# Patient Record
Sex: Male | Born: 1940
Health system: Southern US, Community
[De-identification: ages and names within clinical notes are randomized; demographics above are authoritative.]

## PROBLEM LIST (undated history)

## (undated) DIAGNOSIS — M1990 Unspecified osteoarthritis, unspecified site: Secondary | ICD-10-CM

## (undated) DIAGNOSIS — R001 Bradycardia, unspecified: Secondary | ICD-10-CM

## (undated) DIAGNOSIS — I10 Essential (primary) hypertension: Secondary | ICD-10-CM

## (undated) DIAGNOSIS — E785 Hyperlipidemia, unspecified: Secondary | ICD-10-CM

## (undated) DIAGNOSIS — I714 Abdominal aortic aneurysm, without rupture, unspecified: Secondary | ICD-10-CM

## (undated) DIAGNOSIS — K219 Gastro-esophageal reflux disease without esophagitis: Secondary | ICD-10-CM

## (undated) DIAGNOSIS — K649 Unspecified hemorrhoids: Secondary | ICD-10-CM

## (undated) DIAGNOSIS — I499 Cardiac arrhythmia, unspecified: Secondary | ICD-10-CM

## (undated) DIAGNOSIS — Z8719 Personal history of other diseases of the digestive system: Secondary | ICD-10-CM

## (undated) DIAGNOSIS — J302 Other seasonal allergic rhinitis: Secondary | ICD-10-CM

## (undated) HISTORY — PX: HERNIA REPAIR: SHX51

## (undated) HISTORY — DX: Hyperlipidemia, unspecified: E78.5

## (undated) HISTORY — PX: TOTAL ELBOW REPLACEMENT: SUR1214

## (undated) HISTORY — PX: EYE SURGERY: SHX253

## (undated) HISTORY — PX: DIAGNOSTIC LAPAROSCOPY: SUR761

## (undated) HISTORY — PX: JOINT REPLACEMENT: SHX530

---

## 1991-11-15 HISTORY — PX: OTHER SURGICAL HISTORY: SHX169

## 1998-04-15 ENCOUNTER — Observation Stay (HOSPITAL_COMMUNITY): Admission: RE | Admit: 1998-04-15 | Discharge: 1998-04-16 | Payer: Self-pay | Admitting: General Surgery

## 2004-11-17 ENCOUNTER — Encounter: Admission: RE | Admit: 2004-11-17 | Discharge: 2004-11-17 | Payer: Self-pay | Admitting: General Surgery

## 2005-01-19 ENCOUNTER — Encounter: Admission: RE | Admit: 2005-01-19 | Discharge: 2005-01-19 | Payer: Self-pay | Admitting: Internal Medicine

## 2005-05-24 ENCOUNTER — Encounter: Admission: RE | Admit: 2005-05-24 | Discharge: 2005-05-24 | Payer: Self-pay | Admitting: Internal Medicine

## 2005-11-28 ENCOUNTER — Encounter: Admission: RE | Admit: 2005-11-28 | Discharge: 2005-11-28 | Payer: Self-pay | Admitting: Internal Medicine

## 2007-01-31 ENCOUNTER — Encounter: Admission: RE | Admit: 2007-01-31 | Discharge: 2007-01-31 | Payer: Self-pay | Admitting: Internal Medicine

## 2007-11-15 HISTORY — PX: CATARACT EXTRACTION: SUR2

## 2008-11-26 ENCOUNTER — Encounter: Admission: RE | Admit: 2008-11-26 | Discharge: 2008-11-26 | Payer: Self-pay | Admitting: Internal Medicine

## 2013-03-25 ENCOUNTER — Other Ambulatory Visit: Payer: Self-pay | Admitting: Internal Medicine

## 2013-03-25 DIAGNOSIS — R7989 Other specified abnormal findings of blood chemistry: Secondary | ICD-10-CM

## 2013-03-26 ENCOUNTER — Ambulatory Visit
Admission: RE | Admit: 2013-03-26 | Discharge: 2013-03-26 | Disposition: A | Discharge status: Home / Self Care | Payer: Medicare Other | Source: Ambulatory Visit | Attending: Internal Medicine | Admitting: Internal Medicine

## 2013-03-26 DIAGNOSIS — R7989 Other specified abnormal findings of blood chemistry: Secondary | ICD-10-CM

## 2013-03-26 DIAGNOSIS — IMO0002 Reserved for concepts with insufficient information to code with codable children: Secondary | ICD-10-CM

## 2013-04-29 ENCOUNTER — Other Ambulatory Visit: Payer: Self-pay | Admitting: Urology

## 2013-05-03 ENCOUNTER — Encounter (HOSPITAL_COMMUNITY): Payer: Self-pay | Admitting: Pharmacy Technician

## 2013-05-09 ENCOUNTER — Encounter (HOSPITAL_COMMUNITY): Payer: Self-pay

## 2013-05-09 ENCOUNTER — Encounter (HOSPITAL_COMMUNITY)
Admission: RE | Admit: 2013-05-09 | Discharge: 2013-05-09 | Disposition: A | Discharge status: Home / Self Care | Payer: Medicare Other | Source: Ambulatory Visit | Attending: Urology | Admitting: Urology

## 2013-05-09 ENCOUNTER — Ambulatory Visit (HOSPITAL_COMMUNITY)
Admission: RE | Admit: 2013-05-09 | Discharge: 2013-05-09 | Disposition: A | Discharge status: Home / Self Care | Payer: Medicare Other | Source: Ambulatory Visit | Attending: Urology | Admitting: Urology

## 2013-05-09 DIAGNOSIS — Z01818 Encounter for other preprocedural examination: Secondary | ICD-10-CM | POA: Insufficient documentation

## 2013-05-09 DIAGNOSIS — I10 Essential (primary) hypertension: Secondary | ICD-10-CM | POA: Insufficient documentation

## 2013-05-09 HISTORY — DX: Other seasonal allergic rhinitis: J30.2

## 2013-05-09 HISTORY — DX: Essential (primary) hypertension: I10

## 2013-05-09 HISTORY — DX: Unspecified hemorrhoids: K64.9

## 2013-05-09 HISTORY — DX: Cardiac arrhythmia, unspecified: I49.9

## 2013-05-09 LAB — CBC
MCV: 84.8 fL (ref 78.0–100.0)
Platelets: 161 10*3/uL (ref 150–400)
RDW: 12.8 % (ref 11.5–15.5)
WBC: 9.6 10*3/uL (ref 4.0–10.5)

## 2013-05-09 LAB — BASIC METABOLIC PANEL
Chloride: 103 mEq/L (ref 96–112)
Creatinine, Ser: 1.19 mg/dL (ref 0.50–1.35)
GFR calc Af Amer: 70 mL/min — ABNORMAL LOW (ref >90)
Potassium: 4.1 mEq/L (ref 3.5–5.1)
Sodium: 140 mEq/L (ref 135–145)

## 2013-05-09 LAB — SURGICAL PCR SCREEN
MRSA, PCR: NEGATIVE
Staphylococcus aureus: NEGATIVE

## 2013-05-09 NOTE — Progress Notes (Signed)
LOV note Dr. Nehemiah Settle 04/30/13 on chart, office note 03/22/12 Dr. Mayford Knife on chart, ECHO 03/22/12 on chart

## 2013-05-09 NOTE — Patient Instructions (Signed)
20 SHADMAN TOZZI  05/09/2013   Your procedure is scheduled on: 05/15/13  Report to [facility] at 7:15 AM.  Call this number if you have problems the morning of surgery [phone]: [phone]   Remember:   Do not eat food or drink liquids After Midnight.     Take these medicines the morning of surgery with A SIP OF WATER: amlodipine   Do not wear jewelry, make-up or nail polish.  Do not wear lotions, powders, or perfumes. You may wear deodorant.  Do not shave 48 hours prior to surgery. Men may shave face and neck.  Do not bring valuables to the hospital.  Contacts, dentures or bridgework may not be worn into surgery.  Leave suitcase in the car. After surgery it may be brought to your room.  For patients admitted to the hospital, checkout time is 11:00 AM the day of discharge.    Please read over the following fact sheets that you were given: MRSA Information, blood fact sheet  Birdie Sons, RN  pre op nurse call if needed [phone]    FAILURE TO FOLLOW THESE INSTRUCTIONS MAY RESULT IN CANCELLATION OF YOUR SURGERY   Patient Signature: ___________________________________________

## 2013-05-14 MED ORDER — DEXTROSE 5 % IV SOLN
365.0000 mg | INTRAVENOUS | Status: AC
  Administered 2013-05-15: 365 mg via INTRAVENOUS
  Filled 2013-05-14: qty 9.13

## 2013-05-14 NOTE — H&P (Signed)
History of Present Illness  James Estrada presents today for followup status post his recent urodynamics. Again the recent events over the last month or outlined in the previous notes. In summary he developed real true silent prostatism. Creatinine increased to close to 2 and the patient was noted to have a markedly distended bladder and bilateral hydroureteronephrosis. The patient was catheterized for well over 1000 cc residual urine. His hydronephrosis for the most part has resolved. Creatinine one recheck was down to 1.37 which was strongly encouraging. He has been known to have massive BPH with a prostate of over 100 g back in 2007. Sizes not been reassessed more recently. He has been on Avodart but only for the last 3-4 weeks. The patient recently underwent urodynamic testing to determine how much of this was secondary to outlet obstruction versus decompensated bladder. He recently underwent urodynamic testing to determine how much of this is secondary to obstruction versus decompensated bladder.  On urodynamic testing first sensation occurred at 350 mL. The patient did not develop a strong desire until 650 mL. There did not appear to be any definitive evidence of instability. On pressure flow studies the patient unfortunately was not able to void. Detrusor pressure however was 78 cm of water pressure. He did have what looked like a fairly well sustained voluntary detrusor contraction. His Foley catheter was reinserted. It does appear that he is a good candidate for surgical relief of his outlet obstruction. The rule issue at this point is whether he could undergo an endoscopic procedure or whether he would be better off having an open prostatectomy.   Surgical History Problems  1. History of  Inguinal Hernia Repair  Current Meds 1. Aspirin 81 MG Oral Tablet; 1 per day; Therapy: (Recorded:22Mar2012) to 2. Jalyn 0.5-0.4 MG Oral Capsule; TAKE 1 CAPSULE Daily; Therapy: 16May2014 to  (Evaluate:16May2015)   Requested for: 16May2014; Last Rx:16May2014 3. Multi-Vitamin TABS; 1 per day; Therapy: (Recorded:22Mar2012) to 4. Omega-3 CF CAPS; TAKE 1 CAPSULE DAILY; Therapy: (Recorded:22Mar2012) to  Allergies Medication  1. No Known Drug Allergies  Family History Problems  1. Maternal history of  Acute Myocardial Infarction V17.3 2. Family history of  Acute Myocardial Infarction V17.3 grandfather 3. Family history of  Prostate Cancer V57.42 uncle  Social History Problems  1. Alcohol Use 1 2. Caffeine Use 2 3. Marital History - Currently Married 4. Never A Smoker 5. Occupation: retired Denied  6. Tobacco Use  Review of Systems Genitourinary, constitutional, skin, eye, otolaryngeal, hematologic/lymphatic, cardiovascular, pulmonary, endocrine, musculoskeletal, gastrointestinal, neurological and psychiatric system(s) were reviewed and pertinent findings if present are noted.  Genitourinary: urinary frequency and nocturia, but urine stream is not weak, no incomplete emptying of bladder and no hematuria.  Gastrointestinal: heartburn.  ENT: no sore throat and no sinus problems.    Vitals Vital Signs [Data Includes: Last 1 Day]  12Jun2014 10:38AM  Blood Pressure: 150 / 83 Temperature: 98.6 F Heart Rate: 56  Well-developed well-nourished male in no acute distress Respiratory: Normal effort Cardiac: Regular rate and rhythm Abdomen: Soft nontender no palpable masses Genitourinary: Normal external genitalia with indwelling Foley catheter. Prostate 3+ without nodules. Extremities: No edema or tenderness Neurologic: Nonfocal   Results/Data  Prostate ultrasound: This was done to determine prostate size for treatment planning purposes. Prostatic urethral length was just over 7 cm. Total volume was 165 g     Procedure  Procedure: Cystoscopy   Indication: Lower Urinary Tract Symptoms.  Informed Consent: Risks, benefits, and potential adverse events were discussed and  informed consent was  obtained from the patient . Specific risks including, but not limited to bleeding, infection, pain, allergic reaction etc. were explained.  Prep: The patient was prepped with betadine.  Anesthesia:. Local anesthesia was administered intraurethrally with 2% lidocaine jelly.  Procedure Note:  Urethral meatus:. No abnormalities.  Anterior urethra: No abnormalities.  Prostatic urethra:. Estimated length was 6 cm. There was visual obstruction of the prostatic urethra. The lateral prostatic lobes were enlarged.  Bladder: Visulization was clear. Examination of the bladder demonstrated erythematous mucosa and edema. The patient tolerated the procedure well.  Complications: None. Foley re-inserted.    Assessment Assessed  1. Acute Urinary Retention 788.20 2. Benign Prostatic Hypertrophy With Urinary Obstruction 600.01  Plan  Benign Prostatic Hypertrophy With Urinary Obstruction (600.01)  1. Cysto  Done: 12Jun2014 2. Follow-up Schedule Surgery Office  Follow-up  Done: 12Jun2014  PROSTATE U/S  Status: Resulted - Requires Verification  Done: 01Jan0001 12:00AM Ordered Today; For: Benign Prostatic Hypertrophy With Urinary Obstruction (600.01); Ordered By: Barron Alvine  Due: 14Jun2014 Marked Important; Last Updated By: Dorcas Mcmurray   Discussion/Summary  Naethan clearly has a massive BPH. I am extremely encouraged that his renal function has improved dramatically with catheter drainage and I would expect to continue to improve. I was also very encouraged on urodynamic study was able to generate a well sustained detrusor contraction. This suggested to have a much better chance of having a successful outcome from surgical release of his BPH last outlet obstruction. The real issue is whether he would be better served with a simple open prostatectomy versus a endoscopic surgical procedure.   A total of 45 minutes were spent in the overall care of the patient today with 25 minutes in direct face to face  consultation.    Signatures Electronically signed by : Barron Alvine, M.D.; Apr 25 2013  1:10PM

## 2013-05-15 ENCOUNTER — Inpatient Hospital Stay (HOSPITAL_COMMUNITY): Payer: Medicare Other | Admitting: Anesthesiology

## 2013-05-15 ENCOUNTER — Encounter (HOSPITAL_COMMUNITY): Payer: Self-pay | Admitting: Anesthesiology

## 2013-05-15 ENCOUNTER — Encounter (HOSPITAL_COMMUNITY)
Admission: RE | Disposition: A | Discharge status: Home / Self Care | Payer: Self-pay | Source: Ambulatory Visit | Attending: Urology

## 2013-05-15 ENCOUNTER — Inpatient Hospital Stay (HOSPITAL_COMMUNITY)
Admission: RE | Admit: 2013-05-15 | Discharge: 2013-05-18 | DRG: 707 | Disposition: A | Discharge status: Home / Self Care | Payer: Medicare Other | Source: Ambulatory Visit | Attending: Urology | Admitting: Urology

## 2013-05-15 ENCOUNTER — Encounter (HOSPITAL_COMMUNITY): Payer: Self-pay | Admitting: *Deleted

## 2013-05-15 DIAGNOSIS — N320 Bladder-neck obstruction: Secondary | ICD-10-CM | POA: Diagnosis present

## 2013-05-15 DIAGNOSIS — R339 Retention of urine, unspecified: Secondary | ICD-10-CM | POA: Diagnosis present

## 2013-05-15 DIAGNOSIS — N3289 Other specified disorders of bladder: Secondary | ICD-10-CM | POA: Diagnosis present

## 2013-05-15 DIAGNOSIS — N1330 Unspecified hydronephrosis: Secondary | ICD-10-CM | POA: Diagnosis present

## 2013-05-15 DIAGNOSIS — Z8042 Family history of malignant neoplasm of prostate: Secondary | ICD-10-CM

## 2013-05-15 DIAGNOSIS — N138 Other obstructive and reflux uropathy: Principal | ICD-10-CM | POA: Diagnosis present

## 2013-05-15 DIAGNOSIS — N289 Disorder of kidney and ureter, unspecified: Secondary | ICD-10-CM | POA: Diagnosis present

## 2013-05-15 DIAGNOSIS — N182 Chronic kidney disease, stage 2 (mild): Secondary | ICD-10-CM | POA: Diagnosis present

## 2013-05-15 DIAGNOSIS — N401 Enlarged prostate with lower urinary tract symptoms: Secondary | ICD-10-CM

## 2013-05-15 HISTORY — PX: PROSTATECTOMY: SHX69

## 2013-05-15 LAB — TYPE AND SCREEN
ABO/RH(D): A POS
Antibody Screen: NEGATIVE

## 2013-05-15 SURGERY — PROSTATECTOMY, RETROPUBIC
Anesthesia: General | Wound class: Clean Contaminated

## 2013-05-15 MED ORDER — DEXTROSE 5 % IV SOLN
1.0000 g | INTRAVENOUS | Status: DC
  Administered 2013-05-15 – 2013-05-17 (×3): 1 g via INTRAVENOUS
  Filled 2013-05-15 (×4): qty 10

## 2013-05-15 MED ORDER — LACTATED RINGERS IV SOLN: INTRAVENOUS | Status: DC

## 2013-05-15 MED ORDER — OXYCODONE-ACETAMINOPHEN 5-325 MG PO TABS
1.0000 | ORAL_TABLET | ORAL | Status: DC | PRN
  Administered 2013-05-15 (×2): 2 via ORAL
  Administered 2013-05-16 – 2013-05-17 (×6): 1 via ORAL
  Filled 2013-05-15: qty 1
  Filled 2013-05-15: qty 2
  Filled 2013-05-15: qty 1
  Filled 2013-05-15: qty 2
  Filled 2013-05-15 (×3): qty 1
  Filled 2013-05-15: qty 2

## 2013-05-15 MED ORDER — DOCUSATE SODIUM 100 MG PO CAPS
100.0000 mg | ORAL_CAPSULE | Freq: Two times a day (BID) | ORAL | Status: DC
  Administered 2013-05-15 – 2013-05-18 (×7): 100 mg via ORAL
  Filled 2013-05-15 (×8): qty 1

## 2013-05-15 MED ORDER — PROMETHAZINE HCL 25 MG/ML IJ SOLN: 6.2500 mg | INTRAMUSCULAR | Status: DC | PRN

## 2013-05-15 MED ORDER — CEFAZOLIN SODIUM-DEXTROSE 2-3 GM-% IV SOLR
2.0000 g | INTRAVENOUS | Status: AC
  Administered 2013-05-15: 2 g via INTRAVENOUS

## 2013-05-15 MED ORDER — MORPHINE SULFATE 2 MG/ML IJ SOLN: 2.0000 mg | INTRAMUSCULAR | Status: DC | PRN

## 2013-05-15 MED ORDER — TAMSULOSIN HCL 0.4 MG PO CAPS
0.4000 mg | ORAL_CAPSULE | Freq: Every day | ORAL | Status: DC
  Administered 2013-05-15 – 2013-05-16 (×2): 0.4 mg via ORAL
  Filled 2013-05-15 (×3): qty 1

## 2013-05-15 MED ORDER — GLYCOPYRROLATE 0.2 MG/ML IJ SOLN
INTRAMUSCULAR | Status: DC | PRN
  Administered 2013-05-15: .6 mg via INTRAVENOUS

## 2013-05-15 MED ORDER — SODIUM CHLORIDE 0.9 % IR SOLN
Status: DC | PRN
  Administered 2013-05-15: 3000 mL via INTRAVESICAL
  Administered 2013-05-15: 3000 mL

## 2013-05-15 MED ORDER — DUTASTERIDE 0.5 MG PO CAPS
0.5000 mg | ORAL_CAPSULE | Freq: Every day | ORAL | Status: DC
  Administered 2013-05-15 – 2013-05-16 (×2): 0.5 mg via ORAL
  Filled 2013-05-15 (×3): qty 1

## 2013-05-15 MED ORDER — HYDROMORPHONE HCL PF 1 MG/ML IJ SOLN
0.2500 mg | INTRAMUSCULAR | Status: DC | PRN
  Administered 2013-05-15 (×4): 0.5 mg via INTRAVENOUS

## 2013-05-15 MED ORDER — MIDAZOLAM HCL 5 MG/5ML IJ SOLN
INTRAMUSCULAR | Status: DC | PRN
  Administered 2013-05-15: 2 mg via INTRAVENOUS

## 2013-05-15 MED ORDER — FENTANYL CITRATE 0.05 MG/ML IJ SOLN
INTRAMUSCULAR | Status: DC | PRN
  Administered 2013-05-15 (×4): 50 ug via INTRAVENOUS

## 2013-05-15 MED ORDER — CIPROFLOXACIN HCL 250 MG PO TABS: 250.0000 mg | ORAL_TABLET | Freq: Two times a day (BID) | ORAL | Status: DC

## 2013-05-15 MED ORDER — AMLODIPINE BESYLATE 2.5 MG PO TABS
2.5000 mg | ORAL_TABLET | Freq: Every morning | ORAL | Status: DC
  Administered 2013-05-16 – 2013-05-17 (×2): 2.5 mg via ORAL
  Filled 2013-05-15 (×3): qty 1

## 2013-05-15 MED ORDER — 0.9 % SODIUM CHLORIDE (POUR BTL) OPTIME
TOPICAL | Status: DC | PRN
  Administered 2013-05-15: 2000 mL

## 2013-05-15 MED ORDER — LACTATED RINGERS IV SOLN
INTRAVENOUS | Status: DC
  Administered 2013-05-15: 1000 mL via INTRAVENOUS
  Administered 2013-05-15: 12:00:00 via INTRAVENOUS

## 2013-05-15 MED ORDER — PROPOFOL 10 MG/ML IV BOLUS
INTRAVENOUS | Status: DC | PRN
  Administered 2013-05-15: 180 mg via INTRAVENOUS

## 2013-05-15 MED ORDER — HYDROCODONE-ACETAMINOPHEN 5-325 MG PO TABS: 1.0000 | ORAL_TABLET | Freq: Four times a day (QID) | ORAL | Status: DC | PRN

## 2013-05-15 MED ORDER — DUTASTERIDE-TAMSULOSIN HCL 0.5-0.4 MG PO CAPS: 1.0000 | ORAL_CAPSULE | Freq: Every day | ORAL | Status: DC

## 2013-05-15 MED ORDER — ROCURONIUM BROMIDE 100 MG/10ML IV SOLN
INTRAVENOUS | Status: DC | PRN
  Administered 2013-05-15 (×2): 5 mg via INTRAVENOUS
  Administered 2013-05-15: 50 mg via INTRAVENOUS

## 2013-05-15 MED ORDER — BUPIVACAINE HCL (PF) 0.25 % IJ SOLN
INTRAMUSCULAR | Status: DC | PRN
  Administered 2013-05-15: 20 mL

## 2013-05-15 MED ORDER — ONDANSETRON HCL 4 MG/2ML IJ SOLN: 4.0000 mg | INTRAMUSCULAR | Status: DC | PRN

## 2013-05-15 MED ORDER — KCL IN DEXTROSE-NACL 20-5-0.45 MEQ/L-%-% IV SOLN
INTRAVENOUS | Status: DC
  Administered 2013-05-15 – 2013-05-16 (×2): via INTRAVENOUS
  Filled 2013-05-15 (×8): qty 1000

## 2013-05-15 MED ORDER — NEOSTIGMINE METHYLSULFATE 1 MG/ML IJ SOLN
INTRAMUSCULAR | Status: DC | PRN
  Administered 2013-05-15: 4 mg via INTRAVENOUS

## 2013-05-15 MED ORDER — ONDANSETRON HCL 4 MG/2ML IJ SOLN
INTRAMUSCULAR | Status: DC | PRN
  Administered 2013-05-15: 4 mg via INTRAVENOUS

## 2013-05-15 MED ORDER — LIDOCAINE HCL (CARDIAC) 20 MG/ML IV SOLN
INTRAVENOUS | Status: DC | PRN
  Administered 2013-05-15: 80 mg via INTRAVENOUS

## 2013-05-15 MED ORDER — OXYBUTYNIN CHLORIDE 5 MG PO TABS
5.0000 mg | ORAL_TABLET | Freq: Three times a day (TID) | ORAL | Status: DC | PRN
  Filled 2013-05-15: qty 1

## 2013-05-15 MED ORDER — EPHEDRINE SULFATE 50 MG/ML IJ SOLN
INTRAMUSCULAR | Status: DC | PRN
  Administered 2013-05-15: 5 mg via INTRAVENOUS

## 2013-05-15 MED ORDER — SODIUM CHLORIDE 0.9 % IR SOLN
3000.0000 mL | Status: DC
  Administered 2013-05-15 (×2): 3000 mL

## 2013-05-15 SURGICAL SUPPLY — 54 items
BAG URINE DRAINAGE (UROLOGICAL SUPPLIES) ×2 IMPLANT
BLADE EXTENDED COATED 6.5IN (ELECTRODE) ×2 IMPLANT
BLADE HEX COATED 2.75 (ELECTRODE) ×2 IMPLANT
BLADE SURG 15 STRL LF DISP TIS (BLADE) ×1 IMPLANT
BLADE SURG 15 STRL SS (BLADE) ×1
CATH FOLEY 2WAY SLVR  5CC 22FR (CATHETERS)
CATH FOLEY 2WAY SLVR 30CC 22FR (CATHETERS) ×2 IMPLANT
CATH FOLEY 2WAY SLVR 5CC 22FR (CATHETERS) IMPLANT
CATH FOLEY 3WAY 30CC 24FR (CATHETERS) ×1
CATH URTH STD 24FR FL 3W 2 (CATHETERS) ×1 IMPLANT
CLIP LIGATING HEM O LOK PURPLE (MISCELLANEOUS) ×2 IMPLANT
CLIP LIGATING HEMO O LOK GREEN (MISCELLANEOUS) ×2 IMPLANT
CLOTH BEACON ORANGE TIMEOUT ST (SAFETY) ×2 IMPLANT
COVER SURGICAL LIGHT HANDLE (MISCELLANEOUS) ×2 IMPLANT
DISSECTOR ROUND CHERRY 3/8 STR (MISCELLANEOUS) ×2 IMPLANT
DRAIN CHANNEL 10F 3/8 F FF (DRAIN) ×2 IMPLANT
DRAPE LAPAROTOMY T 102X78X121 (DRAPES) ×2 IMPLANT
DRAPE TABLE BACK 44X90 PK DISP (DRAPES) ×2 IMPLANT
DRAPE WARM FLUID 44X44 (DRAPE) ×2 IMPLANT
DRSG PAD ABDOMINAL 8X10 ST (GAUZE/BANDAGES/DRESSINGS) ×2 IMPLANT
ELECT REM PT RETURN 9FT ADLT (ELECTROSURGICAL) ×2
ELECTRODE REM PT RTRN 9FT ADLT (ELECTROSURGICAL) ×1 IMPLANT
EVACUATOR SILICONE 100CC (DRAIN) ×2 IMPLANT
GAUZE SPONGE 4X4 16PLY XRAY LF (GAUZE/BANDAGES/DRESSINGS) ×2 IMPLANT
GLOVE BIOGEL M STRL SZ7.5 (GLOVE) ×2 IMPLANT
GOWN PREVENTION PLUS XLARGE (GOWN DISPOSABLE) ×4 IMPLANT
GOWN STRL REIN XL XLG (GOWN DISPOSABLE) ×2 IMPLANT
HOLDER FOLEY CATH W/STRAP (MISCELLANEOUS) ×2 IMPLANT
KIT BASIN OR (CUSTOM PROCEDURE TRAY) ×2 IMPLANT
LUBRICANT JELLY ST 5GR 8946 (MISCELLANEOUS) IMPLANT
NS IRRIG 1000ML POUR BTL (IV SOLUTION) IMPLANT
PACK GENERAL/GYN (CUSTOM PROCEDURE TRAY) ×2 IMPLANT
PLUG CATH AND CAP STER (CATHETERS) ×2 IMPLANT
SET IRRIG Y TYPE TUR BLADDER L (SET/KITS/TRAYS/PACK) ×2 IMPLANT
SPONGE GAUZE 4X4 12PLY (GAUZE/BANDAGES/DRESSINGS) IMPLANT
SPONGE LAP 18X18 X RAY DECT (DISPOSABLE) ×4 IMPLANT
SPONGE LAP 4X18 X RAY DECT (DISPOSABLE) IMPLANT
STAPLER VISISTAT 35W (STAPLE) ×2 IMPLANT
SUT ETHILON 3 0 PS 1 (SUTURE) IMPLANT
SUT PDS AB 1 CTX 36 (SUTURE) ×2 IMPLANT
SUT SILK 0 (SUTURE)
SUT SILK 0 30XBRD TIE 6 (SUTURE) IMPLANT
SUT VIC AB 0 CTX 27 (SUTURE) IMPLANT
SUT VIC AB 2-0 UR6 27 (SUTURE) ×4 IMPLANT
SUT VIC AB 4-0 RB1 27 (SUTURE)
SUT VIC AB 4-0 RB1 27XBRD (SUTURE) IMPLANT
SUT VICRYL 0 TIES 12 18 (SUTURE) ×2 IMPLANT
SUT VICRYL 0 UR6 27IN ABS (SUTURE) ×12 IMPLANT
SYR 30ML LL (SYRINGE) ×2 IMPLANT
SYRINGE IRR TOOMEY STRL 70CC (SYRINGE) ×2 IMPLANT
TAPE CLOTH SURG 4X10 WHT LF (GAUZE/BANDAGES/DRESSINGS) IMPLANT
TAPE UMBILICAL COTTON 1/8X30 (MISCELLANEOUS) IMPLANT
TOWEL OR 17X26 10 PK STRL BLUE (TOWEL DISPOSABLE) ×2 IMPLANT
WATER STERILE IRR 1500ML POUR (IV SOLUTION) IMPLANT

## 2013-05-15 NOTE — Interval H&P Note (Signed)
History and Physical Interval Note:  05/15/2013 9:17 AM  James Estrada  has presented today for surgery, with the diagnosis of BENIGN PROSTATIC HYPERTROPHY, URINARY RETENTION  The various methods of treatment have been discussed with the patient and family. After consideration of risks, benefits and other options for treatment, the patient has consented to  Procedure(s): PROSTATECTOMY RETROPUBIC; SIMPLE OPEN PROSTATECTOMY (N/A) as a surgical intervention .  The patient's history has been reviewed, patient examined, no change in status, stable for surgery.  I have reviewed the patient's chart and labs.  Questions were answered to the patient's satisfaction.     Heike Pounds S

## 2013-05-15 NOTE — Anesthesia Preprocedure Evaluation (Addendum)
Anesthesia Evaluation  Patient identified by MRN, date of birth, ID band Patient awake    Reviewed: Allergy & Precautions, H&P , NPO status , Patient's Chart, lab work & pertinent test results  History of Anesthesia Complications Negative for: history of anesthetic complications  Airway Mallampati: I TM Distance: >3 FB Neck ROM: Full    Dental  (+) Teeth Intact and Caps   Pulmonary neg pulmonary ROS,          Cardiovascular hypertension, Pt. on medications + dysrhythmias Rhythm:Regular Rate:Normal     Neuro/Psych negative neurological ROS  negative psych ROS   GI/Hepatic negative GI ROS, Neg liver ROS,   Endo/Other  negative endocrine ROSPrior resection of pheochromocytoma  Renal/GU      Musculoskeletal   Abdominal Normal abdominal exam  (+)   Peds  Hematology negative hematology ROS (+)   Anesthesia Other Findings   Reproductive/Obstetrics                      Anesthesia Physical Anesthesia Plan  ASA: II  Anesthesia Plan: General   Post-op Pain Management:    Induction:   Airway Management Planned: Oral ETT  Additional Equipment:   Intra-op Plan:   Post-operative Plan: Extubation in OR  Informed Consent: I have reviewed the patients History and Physical, chart, labs and discussed the procedure including the risks, benefits and alternatives for the proposed anesthesia with the patient or authorized representative who has indicated his/her understanding and acceptance.   Dental advisory given  Plan Discussed with: CRNA  Anesthesia Plan Comments:         Anesthesia Quick Evaluation

## 2013-05-15 NOTE — Preoperative (Signed)
Beta Blockers   Reason not to administer Beta Blockers:Not Applicable 

## 2013-05-15 NOTE — Anesthesia Postprocedure Evaluation (Signed)
Anesthesia Post Note  Patient: James Estrada  Procedure(s) Performed: Procedure(s) (LRB): PROSTATECTOMY RETROPUBIC; SIMPLE OPEN PROSTATECTOMY (N/A)  Anesthesia type: General  Patient location: PACU  Post pain: Pain level controlled  Post assessment: Post-op Vital signs reviewed  Last Vitals:  Filed Vitals:   05/15/13 1400  BP: 142/80  Pulse: 49  Temp: 36.7 C  Resp: 13    Post vital signs: Reviewed  Level of consciousness: sedated  Complications: No apparent anesthesia complications

## 2013-05-15 NOTE — Care Management Note (Signed)
    Page 1 of 1   05/15/2013     2:41:53 PM   CARE MANAGEMENT NOTE 05/15/2013  Patient:  James Estrada, James Estrada   Account Number:  [id number]  Date Initiated:  05/15/2013  Documentation initiated by:  Lanier Clam  Subjective/Objective Assessment:   ADMITTED W/BPH,URINARY RETENTION.     Action/Plan:   FROM HOME.   Anticipated DC Date:  05/16/2013   Anticipated DC Plan:  HOME/SELF CARE      DC Planning Services  CM consult      Choice offered to / List presented to:             Status of service:  In process, will continue to follow Medicare Important Message given?   (If response is "NO", the following Medicare IM given date fields will be blank) Date Medicare IM given:   Date Additional Medicare IM given:    Discharge Disposition:    Per UR Regulation:  Reviewed for med. necessity/level of care/duration of stay  If discussed at Long Length of Stay Meetings, dates discussed:    Comments:  05/15/13 Tyde Lamison RN,BSN NCM [phone] S/P OPEN PROSTATECTOMY.

## 2013-05-15 NOTE — Transfer of Care (Signed)
Immediate Anesthesia Transfer of Care Note  Patient: James Estrada  Procedure(s) Performed: Procedure(s): PROSTATECTOMY RETROPUBIC; SIMPLE OPEN PROSTATECTOMY (N/A)  Patient Location: PACU  Anesthesia Type:General  Level of Consciousness: awake, alert , oriented and patient cooperative  Airway & Oxygen Therapy: Patient Spontanous Breathing and Patient connected to face mask oxygen  Post-op Assessment: Report given to PACU RN, Post -op Vital signs reviewed and stable and Patient moving all extremities  Post vital signs: Reviewed and stable  Complications: No apparent anesthesia complications

## 2013-05-15 NOTE — Progress Notes (Signed)
James Estrada is doing well on postoperative night 1. His pain is well-controlled at this time. Urine is light pink in color. I would plan on continued continuous bladder irrigation through the night with possible discontinuation in the morning. Hopefully can start a general diet in the morning. I would anticipate probable discharge Friday or Saturday morning. We did discuss some postoperative issues do's and don'ts and general instructions.

## 2013-05-15 NOTE — Op Note (Signed)
Preoperative diagnosis: Massive BPH with urinary retention Postoperative diagnosis: Same  Procedure: Retropubic open simple prostatectomy   Surgeon: Valetta Fuller M.D. assisted by Dr. Su Grand M.D. Anesthesia: Gen.  Indications: 72 year old male with long-standing BPH and bladder neck obstruction. Recently noted to have some renal insufficiency. He was found to have a markedly elevated postvoid residual with bilateral hydronephrosis. Post would residual was over 1000 cc. With catheter drainage his hydronephrosis resolved and renal function improved significantly. Cystoscopically the patient had massive trilobar BPH with visual obstruction. Ultrasound revealed a prostate of approximately 165 g. Given his massive BPH we felt that the most definitive treatment with the best chance of long-term success would be simple open prostatectomy. That procedure was discussed with him in detail. Risks advantages discussed with full informed consent obtained. The patient does have a chronic indwelling Foley catheter. He is currently being covered with parental broad-spectrum antibiotics and has had placement of PAS compression boots.     Technique and findings: Patient was brought the operating room where he had successful induction of general anesthesia. He was placed in supine position and prepped and draped in usual manner. Appropriate surgical timeout was performed. A standard lower midline incision was performed in the retropubic space was entered. Massively enlarged prostate was appreciated. Stay sutures were placed both in the distal and proximal aspect of the prostate capsule and also to laterally. A capsulotomy was performed. The BPH tissue was then removed with blunt dissected technique. The capsule remained intact. Vicryl suture was utilized for some  Prostatic fossa bleeding.a 24 [place] three-way Foley catheter was then placed through the prostatic fossa and into the bladder neck. The prostate capsule was  closed with running 2-0 Vicryl suture. The pelvic area was copiously irrigated. The bladder was also copiously irrigated and urine was found to be light to medium pink. It retropubic drain was placed. The midline incision was closed with a running PDS suture and then surgical clips. Blood loss was approximately 900 cc. The patient no obvious complications or problems and was brought to recovery room in stable condition.

## 2013-05-16 ENCOUNTER — Encounter (HOSPITAL_COMMUNITY): Payer: Self-pay | Admitting: Urology

## 2013-05-16 LAB — CBC
HCT: 35.6 % — ABNORMAL LOW (ref 39.0–52.0)
Hemoglobin: 11.9 g/dL — ABNORMAL LOW (ref 13.0–17.0)
MCH: 28.5 pg (ref 26.0–34.0)
MCHC: 33.4 g/dL (ref 30.0–36.0)
MCV: 85.2 fL (ref 78.0–100.0)
Platelets: 109 10*3/uL — ABNORMAL LOW (ref 150–400)
RBC: 4.18 MIL/uL — ABNORMAL LOW (ref 4.22–5.81)
RDW: 13.1 % (ref 11.5–15.5)
WBC: 12.4 10*3/uL — ABNORMAL HIGH (ref 4.0–10.5)

## 2013-05-16 LAB — BASIC METABOLIC PANEL
Calcium: 8.9 mg/dL (ref 8.4–10.5)
GFR calc Af Amer: 81 mL/min — ABNORMAL LOW (ref >90)
GFR calc non Af Amer: 70 mL/min — ABNORMAL LOW (ref >90)
Glucose, Bld: 115 mg/dL — ABNORMAL HIGH (ref 70–99)
Potassium: 4 mEq/L (ref 3.5–5.1)
Sodium: 138 mEq/L (ref 135–145)

## 2013-05-16 LAB — ABO/RH: ABO/RH(D): A POS

## 2013-05-16 MED ORDER — MENTHOL 3 MG MT LOZG
1.0000 | LOZENGE | OROMUCOSAL | Status: DC | PRN
  Administered 2013-05-16: 3 mg via ORAL
  Filled 2013-05-16: qty 9

## 2013-05-16 NOTE — Progress Notes (Signed)
1 Day Post-Op Subjective: Patient reports Incisional pain.  Tolerates diet well. C/o sore throat.  No BM yet.  Objective: Vital signs in last 24 hours: Temp:  [97.6 F (36.4 C)-98.5 F (36.9 C)] 97.6 F (36.4 C) (07/03 0456) Pulse Rate:  [44-64] 64 (07/03 0456) Resp:  [10-14] 13 (07/03 0456) BP: (114-149)/(68-91) 117/85 mmHg (07/03 0456) SpO2:  [97 %-100 %] 97 % (07/03 0456) Weight:  [78.926 kg (174 lb)] 78.926 kg (174 lb) (07/02 1700)  Intake/Output from previous day: 07/02 0701 - 07/03 0700 In: 15901.7 [P.O.:720; I.V.:4031.7; IV Piggyback:50] Out: 40981 [XBJYN:82956; Drains:235; Blood:925] Intake/Output this shift: Total I/O In: -  Out: 1925 [Urine:1925]  Physical Exam:  Alert and oriented.  In good spirits. Abdomen: Soft, non distended.   Blake drain:  235 cc since yesterday. Foley draining well.  CBI running.  Urine clear. Scrotum is normal.  No swelling.  Lab Results:  Recent Labs  05/15/13 1324 05/16/13 0514  HGB 12.3* 11.9*  HCT 36.6* 35.6*   BMET  Recent Labs  05/16/13 0514  NA 138  K 4.0  CL 102  CO2 32  GLUCOSE 115*  BUN 10  CREATININE 1.06  CALCIUM 8.9   No results found for this basename: LABPT, INR,  in the last 72 hours No results found for this basename: LABURIN,  in the last 72 hours Results for orders placed during the hospital encounter of 05/09/13  SURGICAL PCR SCREEN     Status: None   Collection Time    05/09/13  2:27 PM      Result Value Range Status   MRSA, PCR NEGATIVE  NEGATIVE Final   Staphylococcus aureus NEGATIVE  NEGATIVE Final   Comment:            The Xpert SA Assay (FDA     approved for NASAL specimens     in patients over 2 years of age),     is one component of     a comprehensive surveillance     program.  Test performance has     been validated by [employer] for patients greater     than or equal to 72 year old.     It is not intended     to diagnose infection nor to     guide or monitor treatment.     Studies/Results: No results found.  Assessment/Plan:  S/P simple retropubic prostatectomy.  Discontinue CBI.  D/C Flomax and Avodart.  Advance diet as tolerated.    LOS: 1 day   Dhanya Bogle-HENRY 05/16/2013, 12:41 PM

## 2013-05-16 NOTE — Clinical Documentation Improvement (Signed)
THIS DOCUMENT IS NOT A PERMANENT PART OF THE MEDICAL RECORD  Please update your documentation with the medical record to reflect your response to this query. If you need help knowing how to do this please call [phone].  05/16/13   Dear Dr. Isabel Caprice, D:/Associates,  In a better effort to capture your patient's severity of illness, reflect appropriate length of stay and utilization of resources, a review of the patient medical record has revealed the following indicators.    Based on your clinical judgment, please clarify and document in a progress note and/or discharge summary the clinical condition associated with the following supporting information:  In responding to this query please exercise your independent judgment.  The fact that a query is asked, does not imply that any particular answer is desired or expected.  Pt with renal insufficiency  Clarification Needed   Please clarify if renal insufficiency can be further specified as one of the diagnoses listed below and document in pn or d/c summary.  Possible Clinical Conditions?   _______CKD Stage I -  GFR > OR = 90 _______CKD Stage II - GFR 60-80 _______CKD Stage III - GFR 30-59 _______CKD Stage IV - GFR 15-29 _______CKD Stage V - GFR < 15 _______ESRD (End Stage Renal Disease) _______Other condition_____________ _______Cannot Clinically determine   Supporting Information:  Risk Factors:  BPH Urinary retention Renal insufficiency Hydronephrosis   Signs & Symptoms:   Diagnostics:  Component     Latest Ref Rng 05/16/2013          GFR calc non Af Amer     >90 mL/min 70 (L)  GFR calc Af Amer     >90 mL/min 81 (L)    Treatment Monitoring  You may use possible, probable, or suspect with inpatient documentation. possible, probable, suspected diagnoses MUST be documented at the time of discharge  Reviewed: additional documentation in the medical record ljh   Thank You,  Enis Slipper RN, BSN, MSN/Inf,  CCDS Clinical Documentation Specialist [facility] Dept Pager: [phone] / E-mail: [email or web address]   [phone] Health Information Management [place]

## 2013-05-17 NOTE — Progress Notes (Signed)
2 Days Post-Op  Subjective:  1 - Benign Prostatic Hyperplasia - s/p open simple prostatectomy 05/15/13 for refractory BPH. Path benign / inflammation. Off irrigation 7/3 and JP out 7/4 (today).  Today James Estrada is w/o significant complaints. He is tolerating regular diet and ambulatory. His path came back benign and I gave him the good news.  Objective: Vital signs in last 24 hours: Temp:  [98.6 F (37 C)-99.7 F (37.6 C)] 98.6 F (37 C) (07/04 0600) Pulse Rate:  [67-71] 67 (07/04 0600) Resp:  [18] 18 (07/04 0600) BP: (115-128)/(55-88) 128/88 mmHg (07/04 0600) SpO2:  [97 %-99 %] 99 % (07/04 0600) Last BM Date: 05/14/13  Intake/Output from previous day: 07/03 0701 - 07/04 0700 In: 3450 [P.O.:700; I.V.:600; IV Piggyback:50] Out: 7290 [Urine:7250; Drains:40] Intake/Output this shift:    General appearance: alert, cooperative and appears stated age Head: Normocephalic, without obvious abnormality, atraumatic Eyes: conjunctivae/corneas clear. PERRL, EOM's intact. Fundi benign. Ears: normal TM's and external ear canals both ears Nose: Nares normal. Septum midline. Mucosa normal. No drainage or sinus tenderness. Throat: lips, mucosa, and tongue normal; teeth and gums normal Neck: no adenopathy, no carotid bruit, no JVD, supple, symmetrical, trachea midline and thyroid not enlarged, symmetric, no tenderness/mass/nodules Back: symmetric, no curvature. ROM normal. No CVA tenderness. Resp: clear to auscultation bilaterally Chest wall: no tenderness Cardio: regular rate and rhythm, S1, S2 normal, no murmur, click, rub or gallop GI: soft, non-tender; bowel sounds normal; no masses,  no organomegaly Male genitalia: normal, Foley c/d/i with light pink urine (no clots) off irrigation. Extremities: extremities normal, atraumatic, no cyanosis or edema Pulses: 2+ and symmetric Skin: Skin color, texture, turgor normal. No rashes or lesions Lymph nodes: Cervical, supraclavicular, and axillary nodes  normal. Neurologic: Grossly normal Incision/Wound: Lower midline incision c/d/i with staples. LLQ JP with minimal serous output, removed and dry dressing applied.  Lab Results:   Recent Labs  05/15/13 1324 05/16/13 0514  WBC  --  12.4*  HGB 12.3* 11.9*  HCT 36.6* 35.6*  PLT  --  109*   BMET  Recent Labs  05/16/13 0514  NA 138  K 4.0  CL 102  CO2 32  GLUCOSE 115*  BUN 10  CREATININE 1.06  CALCIUM 8.9   PT/INR No results found for this basename: LABPROT, INR,  in the last 72 hours ABG No results found for this basename: PHART, PCO2, PO2, HCO3,  in the last 72 hours  Studies/Results: No results found.  Anti-infectives: Anti-infectives   Start     Dose/Rate Route Frequency Ordered Stop   05/15/13 1600  cefTRIAXone (ROCEPHIN) 1 g in dextrose 5 % 50 mL IVPB     1 g 100 mL/hr over 30 Minutes Intravenous Every 24 hours 05/15/13 1437     05/15/13 0743  ceFAZolin (ANCEF) IVPB 2 g/50 mL premix     2 g 100 mL/hr over 30 Minutes Intravenous 30 min pre-op 05/15/13 0743 05/15/13 1112   05/15/13 0600  gentamicin (GARAMYCIN) 365 mg in dextrose 5 % 100 mL IVPB     365 mg 109.1 mL/hr over 60 Minutes Intravenous 30 min pre-op 05/14/13 1351 05/15/13 1119   05/15/13 0000  ciprofloxacin (CIPRO) 250 MG tablet     250 mg Oral 2 times daily 05/15/13 1009        Assessment/Plan:  1 - Benign Prostatic Hyperplasia - Making excellent progress POD2. Encouraged continued ambulation. Discussed goals for DC and will plan for DC tomorrow AM as long as progressing. JP out  todayBerneice Estrada, James Estrada 05/17/2013

## 2013-05-18 MED ORDER — OXYCODONE-ACETAMINOPHEN 5-325 MG PO TABS: 1.0000 | ORAL_TABLET | ORAL | Status: DC | PRN

## 2013-05-18 MED ORDER — SENNA-DOCUSATE SODIUM 8.6-50 MG PO TABS: 1.0000 | ORAL_TABLET | Freq: Two times a day (BID) | ORAL | Status: DC

## 2013-05-18 MED ORDER — CIPROFLOXACIN HCL 250 MG PO TABS: 250.0000 mg | ORAL_TABLET | Freq: Two times a day (BID) | ORAL | Status: DC

## 2013-05-18 NOTE — Discharge Summary (Addendum)
Physician Discharge Summary  Patient ID: James Estrada MRN: [id number] DOB/AGE: February 20, 1941 72 y.o.  Admit date: 05/15/2013 Discharge date: 05/18/2013  Admission Diagnoses: Prostatic Hyperplasia with Refractory urinary symptoms  Discharge Diagnoses: Prostatic Hyperplasia with Refractory urinary symptoms Active Problems:   BPH (benign prostatic hypertrophy) with urinary obstruction   Discharged Condition: good  Hospital Course:   1 - Benign Prostatic Hyperplasia - s/p open simple prostatectomy 05/15/13 for refractory BPH. Path benign / inflammation. Off irrigation 7/3 and JP out 7/4. By 7/5, the day of discharge, pt is ambulatory, tolerating regular diet, pain controlled with PO meds and felt to be adequate for discharge.   Consults: None  Significant Diagnostic Studies: labs: prostate pathology - benign  Treatments: surgery: open simple prostatectomy 05/15/13 for refractory BPH  Discharge Exam: Blood pressure 128/74, pulse 69, temperature 98.2 F (36.8 C), temperature source Oral, resp. rate 16, height 6' (1.829 m), weight 78.926 kg (174 lb), SpO2 97.00%. General appearance: alert, cooperative and appears stated age Head: Normocephalic, without obvious abnormality, atraumatic Eyes: conjunctivae/corneas clear. PERRL, EOM's intact. Fundi benign. Ears: normal TM's and external ear canals both ears Nose: Nares normal. Septum midline. Mucosa normal. No drainage or sinus tenderness. Throat: lips, mucosa, and tongue normal; teeth and gums normal Neck: no adenopathy, no carotid bruit, no JVD, supple, symmetrical, trachea midline and thyroid not enlarged, symmetric, no tenderness/mass/nodules Back: symmetric, no curvature. ROM normal. No CVA tenderness. Resp: clear to auscultation bilaterally Chest wall: no tenderness Cardio: regular rate and rhythm, S1, S2 normal, no murmur, click, rub or gallop GI: soft, non-tender; bowel sounds normal; no masses,  no organomegaly Male genitalia:  normal, foley c/d/i with light pink urine, no clots. Extremities: extremities normal, atraumatic, no cyanosis or edema Pulses: 2+ and symmetric Skin: Skin color, texture, turgor normal. No rashes or lesions Lymph nodes: Cervical, supraclavicular, and axillary nodes normal. Neurologic: Grossly normal Incision/Wound: Recent lower midline incision c/d/i with staples in place. No hernias.  Disposition:      Medication List    STOP taking these medications       aspirin EC 81 MG tablet     fish oil-omega-3 fatty acids 1000 MG capsule      TAKE these medications       acetaminophen 500 MG tablet  Commonly known as:  TYLENOL  Take 500 mg by mouth every 6 (six) hours as needed for pain.     amLODipine 2.5 MG tablet  Commonly known as:  NORVASC  Take 2.5 mg by mouth every morning.     ciprofloxacin 250 MG tablet  Commonly known as:  CIPRO  Take 1 tablet (250 mg total) by mouth 2 (two) times daily.     ciprofloxacin 250 MG tablet  Commonly known as:  CIPRO  Take 1 tablet (250 mg total) by mouth 2 (two) times daily. X 3 days. Begin day prior to next urology appointment.     HYDROcodone-acetaminophen 5-325 MG per tablet  Commonly known as:  NORCO/VICODIN  Take 1-2 tablets by mouth every 6 (six) hours as needed for pain.     JALYN PO  Take 0.5 mg by mouth daily.     multivitamin with minerals Tabs  Take 1 tablet by mouth daily.     oxyCODONE-acetaminophen 5-325 MG per tablet  Commonly known as:  PERCOCET/ROXICET  Take 1-2 tablets by mouth every 4 (four) hours as needed.     sennosides-docusate sodium 8.6-50 MG tablet  Commonly known as:  SENOKOT-S  Take 1 tablet  by mouth 2 (two) times daily. While taking pain meds to prevent constipation.           Follow-up Information   Follow up with Valetta Fuller, MD On 05/22/2013. (as scheduled)    Contact information:   [address]                         [place] [address] [phone]        Signed: [name] 05/18/2013, 6:55 AM  Patient was discharged by Dr. [name]. Discharge diagnosis should include Stage 2 chronic renal disease

## 2014-06-21 ENCOUNTER — Encounter: Payer: Self-pay | Admitting: *Deleted

## 2015-07-17 ENCOUNTER — Ambulatory Visit
Admission: RE | Admit: 2015-07-17 | Discharge: 2015-07-17 | Disposition: A | Discharge status: Home / Self Care | Payer: Medicare Other | Source: Ambulatory Visit | Attending: Sports Medicine | Admitting: Sports Medicine

## 2015-07-17 ENCOUNTER — Encounter: Payer: Self-pay | Admitting: Sports Medicine

## 2015-07-17 ENCOUNTER — Ambulatory Visit (INDEPENDENT_AMBULATORY_CARE_PROVIDER_SITE_OTHER): Payer: Medicare Other | Admitting: Sports Medicine

## 2015-07-17 VITALS — BP 132/74 | Ht 72.0 in | Wt 187.0 lb

## 2015-07-17 DIAGNOSIS — M5441 Lumbago with sciatica, right side: Secondary | ICD-10-CM

## 2015-07-17 DIAGNOSIS — M545 Low back pain, unspecified: Secondary | ICD-10-CM

## 2015-07-17 DIAGNOSIS — M5442 Lumbago with sciatica, left side: Secondary | ICD-10-CM

## 2015-07-17 NOTE — Progress Notes (Signed)
Patient ID: James Estrada, male   DOB: 09-14-1941, 74 y.o.   MRN: [id number]  CC: LBP with weakness and numbness in legs  HPI:  74 year old man with PMH of BPH presenting with LBP with numbness and weakness with walking in b/l legs States he has had off and on "back problems" for 30 years- intermittent spasm/soreness. No previous imaging. Notes that 1 week ago he felt tightness in his back, went to bed, woke up with severe pain and stiffness in the LB Notes that he had been doing his normal activities of golf and gym, no new events States that the pain would be much worse in the morning on waking, improve with movement  Squatting down while centered hurts Also tried hot tubbing and heat which seemed to help Did not try ice; tried normal stretching and exercise which helped Has tried advil 2 qam and 1qpm which seems to help Is almost back to normal but still feels a weakness and numbness in b/l thighs on ambulation No other red flag symptoms- no tingling, fevers, saddle anesthesia, incontinence or difficulty using restroom, erectile symptoms  Exam:  BP 132/74 mmHg  Ht 6' (1.829 m)  Wt 187 lb (84.823 kg)  BMI 25.36 kg/m2  General: NAD, comfortable Resp: Normal WOB Back:  Inspection: normal in appearance.  Palpation: firm paraspinals in lumbar region but no ttp.  ROM: Normal flexion, extension, shift left and right, and rotation. Mild pain with touching toes.  Strength: 5/5 strength in b/l LE including hip, knee, foot, and big toe. Sensation: subjectively normal b/l Tests: Facet loading neg b/l SLR neg b/l FABER neg b/l FADIR neg b/l Log roll neg b/l Reflexes: 1+ patellar Gait: normal, able to stand on tip toes and heels as well  Assessment:  74 year old man with PMH of BPH presenting with LBP with numbness and weakness with walking in b/l legs. Exam normal except for firm paraspinals, suggesting an element of lumbar spasm. Given his story of numbness and weakness with walking  and the fact that squatting down while centered hurts, concerned for spinal stenosis vs facet arthropathy. It is possible that the lumbar spasm that he is currently experiencing has exacerbated his spinal stenosis.   PLAN:  AP xray of lumbar spine today MRI asap to evaluate for spinal stenosis vs facet arthropathy Will follow up 2-3 days after MRI to discuss and also ensure resolution of symptoms Can continue exercise, stretching, NSAIDS, and heat to comfort level.  Alonna Minium, MD Pt seen and examined with Dr. Margaretha Sheffield.    Patient seen and evaluated with the above-named resident. I agree with her plan of care. Patient's bilateral lower extremity numbness and weakness have me concerned for spinal stenosis. We will go ahead and proceed with x-rays and an MRI to evaluate further. Patient will follow-up with me in the office 2-3 days afterwards to discuss the results.

## 2015-07-21 ENCOUNTER — Telehealth: Payer: Self-pay | Admitting: Sports Medicine

## 2015-07-21 NOTE — Telephone Encounter (Signed)
I spoke with patient on the phone today after reviewing the x-ray of his lumbar spine. He has advanced degenerative disc disease and facet arthropathy at L5-S1. Clinically I think he has an element of spinal stenosis as well. He will go ahead and proceed with the MRI scan as scheduled and follow-up with me one to 2 days afterwards to discuss those results and delineate further treatment.

## 2015-07-27 ENCOUNTER — Inpatient Hospital Stay: Admission: RE | Admit: 2015-07-27 | Payer: Medicare Other | Source: Ambulatory Visit

## 2015-07-29 ENCOUNTER — Ambulatory Visit: Payer: Medicare Other | Admitting: Sports Medicine

## 2015-08-03 ENCOUNTER — Ambulatory Visit (HOSPITAL_COMMUNITY)
Admission: RE | Admit: 2015-08-03 | Discharge: 2015-08-03 | Disposition: A | Discharge status: Home / Self Care | Payer: Medicare Other | Source: Ambulatory Visit | Attending: Sports Medicine | Admitting: Sports Medicine

## 2015-08-03 DIAGNOSIS — M5441 Lumbago with sciatica, right side: Secondary | ICD-10-CM | POA: Insufficient documentation

## 2015-08-03 DIAGNOSIS — I714 Abdominal aortic aneurysm, without rupture: Secondary | ICD-10-CM | POA: Insufficient documentation

## 2015-08-03 DIAGNOSIS — M5442 Lumbago with sciatica, left side: Secondary | ICD-10-CM | POA: Diagnosis present

## 2015-08-03 DIAGNOSIS — M4806 Spinal stenosis, lumbar region: Secondary | ICD-10-CM | POA: Insufficient documentation

## 2015-08-06 ENCOUNTER — Encounter: Payer: Self-pay | Admitting: Sports Medicine

## 2015-08-06 ENCOUNTER — Ambulatory Visit (INDEPENDENT_AMBULATORY_CARE_PROVIDER_SITE_OTHER): Payer: Medicare Other | Admitting: Sports Medicine

## 2015-08-06 VITALS — BP 134/81 | HR 48 | Temp 98.3°F | Ht 72.0 in | Wt 192.6 lb

## 2015-08-06 DIAGNOSIS — M5136 Other intervertebral disc degeneration, lumbar region: Secondary | ICD-10-CM

## 2015-08-06 NOTE — Progress Notes (Signed)
Patient ID: James Estrada, male   DOB: [birth year], 74 y.o.   MRN: [id number]  Patient comes in today to discuss x-ray and MRI findings of his lumbar spine. Dominant finding is advanced lumbar degenerative disc disease at L5-S1. Fortunately he does not have any significant foraminal nor central stenosis. Today he is feeling pretty good. He has had intermittent low back pain for many years. Physical exam was not repeated. We simply talked about his ongoing chronic intermittent pain. His symptoms seem to resolve with fairly low doses of over-the-counter anti-inflammatories. He remains active despite his pain. He is also beginning to experience some lateral right hip pain and he does have some tenderness to palpation directly over the right greater trochanteric bursa. I recommended that the patient spend some time with physical therapy Jonny Ruiz O'Halloran). I think he can continue with activity using pain as his guide. I did explain to him that he will likely have returning pain in the future but as long as over-the-counter anti-inflammatories (he just takes  of motrin in the morning as needed), moist heat, ice, and physical therapy are helpful but I do not believe I need to get more aggressive in treatment.  There was also an incidental finding of an infrarenal abdominal aortic aneurysm measuring 3.6cm in diameter. Recommendation by radiology is to have a follow-up ultrasound in 2 years. The patient does have a positive family history of aneurysms. I will be sure to forward a copy of this dictation to his primary care physician and I've also recommended that the patient himself touch base with Dr.Polite just to ensure that follow-up imaging should not be done sooner.  Follow-up with me as needed.  Total time spent with the patient today was 15 minutes with 100% of the time spent in face-to-face consultation discussing his diagnosis and treatment.

## 2016-01-15 DIAGNOSIS — L918 Other hypertrophic disorders of the skin: Secondary | ICD-10-CM | POA: Diagnosis not present

## 2016-01-15 DIAGNOSIS — L218 Other seborrheic dermatitis: Secondary | ICD-10-CM | POA: Diagnosis not present

## 2016-01-15 DIAGNOSIS — L821 Other seborrheic keratosis: Secondary | ICD-10-CM | POA: Diagnosis not present

## 2016-06-23 DIAGNOSIS — Z0000 Encounter for general adult medical examination without abnormal findings: Secondary | ICD-10-CM | POA: Diagnosis not present

## 2016-06-23 DIAGNOSIS — Z1389 Encounter for screening for other disorder: Secondary | ICD-10-CM | POA: Diagnosis not present

## 2016-06-23 DIAGNOSIS — I714 Abdominal aortic aneurysm, without rupture: Secondary | ICD-10-CM | POA: Diagnosis not present

## 2016-06-23 DIAGNOSIS — I10 Essential (primary) hypertension: Secondary | ICD-10-CM | POA: Diagnosis not present

## 2016-06-23 DIAGNOSIS — E7800 Pure hypercholesterolemia, unspecified: Secondary | ICD-10-CM | POA: Diagnosis not present

## 2016-06-25 DIAGNOSIS — Z23 Encounter for immunization: Secondary | ICD-10-CM | POA: Diagnosis not present

## 2016-07-11 DIAGNOSIS — H5213 Myopia, bilateral: Secondary | ICD-10-CM | POA: Diagnosis not present

## 2016-08-11 DIAGNOSIS — Z1211 Encounter for screening for malignant neoplasm of colon: Secondary | ICD-10-CM | POA: Diagnosis not present

## 2016-08-11 DIAGNOSIS — K649 Unspecified hemorrhoids: Secondary | ICD-10-CM | POA: Diagnosis not present

## 2016-08-11 DIAGNOSIS — D696 Thrombocytopenia, unspecified: Secondary | ICD-10-CM | POA: Diagnosis not present

## 2016-08-23 DIAGNOSIS — D1801 Hemangioma of skin and subcutaneous tissue: Secondary | ICD-10-CM | POA: Diagnosis not present

## 2016-08-23 DIAGNOSIS — D2271 Melanocytic nevi of right lower limb, including hip: Secondary | ICD-10-CM | POA: Diagnosis not present

## 2016-08-23 DIAGNOSIS — D225 Melanocytic nevi of trunk: Secondary | ICD-10-CM | POA: Diagnosis not present

## 2016-08-23 DIAGNOSIS — L918 Other hypertrophic disorders of the skin: Secondary | ICD-10-CM | POA: Diagnosis not present

## 2016-08-23 DIAGNOSIS — L821 Other seborrheic keratosis: Secondary | ICD-10-CM | POA: Diagnosis not present

## 2016-08-23 DIAGNOSIS — L218 Other seborrheic dermatitis: Secondary | ICD-10-CM | POA: Diagnosis not present

## 2016-08-23 DIAGNOSIS — D2272 Melanocytic nevi of left lower limb, including hip: Secondary | ICD-10-CM | POA: Diagnosis not present

## 2016-09-27 DIAGNOSIS — K635 Polyp of colon: Secondary | ICD-10-CM | POA: Diagnosis not present

## 2016-09-27 DIAGNOSIS — D125 Benign neoplasm of sigmoid colon: Secondary | ICD-10-CM | POA: Diagnosis not present

## 2016-09-27 DIAGNOSIS — K5730 Diverticulosis of large intestine without perforation or abscess without bleeding: Secondary | ICD-10-CM | POA: Diagnosis not present

## 2016-09-27 DIAGNOSIS — Z1211 Encounter for screening for malignant neoplasm of colon: Secondary | ICD-10-CM | POA: Diagnosis not present

## 2016-09-27 DIAGNOSIS — K644 Residual hemorrhoidal skin tags: Secondary | ICD-10-CM | POA: Diagnosis not present

## 2016-10-04 DIAGNOSIS — Z1211 Encounter for screening for malignant neoplasm of colon: Secondary | ICD-10-CM | POA: Diagnosis not present

## 2016-10-04 DIAGNOSIS — K635 Polyp of colon: Secondary | ICD-10-CM | POA: Diagnosis not present

## 2017-07-06 ENCOUNTER — Other Ambulatory Visit: Payer: Medicare Other

## 2017-07-06 ENCOUNTER — Other Ambulatory Visit: Payer: Self-pay | Admitting: Internal Medicine

## 2017-07-06 DIAGNOSIS — I714 Abdominal aortic aneurysm, without rupture, unspecified: Secondary | ICD-10-CM

## 2017-07-06 DIAGNOSIS — Z1389 Encounter for screening for other disorder: Secondary | ICD-10-CM | POA: Diagnosis not present

## 2017-07-06 DIAGNOSIS — E7800 Pure hypercholesterolemia, unspecified: Secondary | ICD-10-CM | POA: Diagnosis not present

## 2017-07-06 DIAGNOSIS — Z0000 Encounter for general adult medical examination without abnormal findings: Secondary | ICD-10-CM | POA: Diagnosis not present

## 2017-07-13 DIAGNOSIS — I10 Essential (primary) hypertension: Secondary | ICD-10-CM | POA: Diagnosis not present

## 2017-07-13 DIAGNOSIS — R001 Bradycardia, unspecified: Secondary | ICD-10-CM | POA: Diagnosis not present

## 2017-07-18 ENCOUNTER — Ambulatory Visit
Admission: RE | Admit: 2017-07-18 | Discharge: 2017-07-18 | Disposition: A | Discharge status: Home / Self Care | Payer: Medicare Other | Source: Ambulatory Visit | Attending: Internal Medicine | Admitting: Internal Medicine

## 2017-07-18 DIAGNOSIS — Z136 Encounter for screening for cardiovascular disorders: Secondary | ICD-10-CM | POA: Diagnosis not present

## 2017-07-18 DIAGNOSIS — I714 Abdominal aortic aneurysm, without rupture, unspecified: Secondary | ICD-10-CM

## 2017-07-19 ENCOUNTER — Ambulatory Visit (INDEPENDENT_AMBULATORY_CARE_PROVIDER_SITE_OTHER): Payer: Medicare Other | Admitting: Sports Medicine

## 2017-07-19 VITALS — BP 122/80 | Ht 72.0 in | Wt 199.0 lb

## 2017-07-19 DIAGNOSIS — M25551 Pain in right hip: Secondary | ICD-10-CM | POA: Diagnosis not present

## 2017-07-19 NOTE — Progress Notes (Signed)
   Subjective:    Patient ID: James Estrada, male    DOB: 07-Apr-1941, 76 y.o.   MRN: [id number]  HPI chief complaint: Right hip pain  Patient comes in today complaining of 2 years of right hip pain. Pain is primarily been over the lateral hip but recently he began to develop significant pain in the groin. Pain will radiate down his thigh to his knee. It is worse with walking long distances. He states he is able to walk short distances without much difficulty. He does have a history of lumbar degenerative disc disease and spondylosis but his current pain is different in nature than what he has experienced previously with that. His pain improves at rest. He does take an occasional Advil which helps. He denies any pain in the left hip. He denies any recent trauma. He has not had any imaging.  Past medical history reviewed Medications reviewed Allergies reviewed    Review of Systems As above    Objective:   Physical Exam  Well-developed, well-nourished. No acute distress. Awake alert and oriented 3. Vital signs reviewed  Right hip: Patient has limited internal rotation of the right hip with reproducible groin pain. Mild pain with resisted hip flexion. Minimal tenderness over the greater trochanteric bursa. Negative straight leg raise. Neurovascularly intact distally.      Assessment & Plan:   Right hip pain likely secondary to DJD  X-rays of the right hip specifically to evaluate the amount of DJD present. Phone follow-up with those results when available. In the meantime I discussed his current workout regimen with him which consists of walking on a treadmill, using the elliptical, leg extensions, hamstring curls, and open chain hip strengthening exercises. I recommended that he eliminate the treadmill since it is an impact exercise and instead replace it with either biking or swimming. I think he is okay to continue with the other exercises especially the open chain hip strengthening  exercises. We will discuss other treatment options based on his x-ray results.

## 2017-07-20 ENCOUNTER — Ambulatory Visit
Admission: RE | Admit: 2017-07-20 | Discharge: 2017-07-20 | Disposition: A | Discharge status: Home / Self Care | Payer: Medicare Other | Source: Ambulatory Visit | Attending: Sports Medicine | Admitting: Sports Medicine

## 2017-07-20 ENCOUNTER — Other Ambulatory Visit: Payer: Self-pay | Admitting: Sports Medicine

## 2017-07-20 DIAGNOSIS — M25851 Other specified joint disorders, right hip: Secondary | ICD-10-CM | POA: Diagnosis not present

## 2017-07-20 DIAGNOSIS — M25551 Pain in right hip: Secondary | ICD-10-CM

## 2017-07-24 ENCOUNTER — Telehealth: Payer: Self-pay | Admitting: Sports Medicine

## 2017-07-24 NOTE — Telephone Encounter (Signed)
I spoke with the patient on the phone last week after reviewing the x-ray of his right hip. He has moderately advanced DJD. He would like to meet with Dr. Magnus IvanBlackman to discuss total hip arthroplasty. I'm happy to make that referral for him. He'll follow-up with me as needed.

## 2017-07-25 ENCOUNTER — Other Ambulatory Visit: Payer: Self-pay | Admitting: *Deleted

## 2017-07-25 DIAGNOSIS — M25551 Pain in right hip: Secondary | ICD-10-CM

## 2017-08-07 ENCOUNTER — Ambulatory Visit (INDEPENDENT_AMBULATORY_CARE_PROVIDER_SITE_OTHER): Payer: Medicare Other | Admitting: Orthopaedic Surgery

## 2017-08-07 DIAGNOSIS — M25551 Pain in right hip: Secondary | ICD-10-CM | POA: Insufficient documentation

## 2017-08-07 DIAGNOSIS — M1611 Unilateral primary osteoarthritis, right hip: Secondary | ICD-10-CM | POA: Diagnosis not present

## 2017-08-07 NOTE — Progress Notes (Signed)
Office Visit Note   Patient: James Estrada           Date of Birth: 12-27-1940           MRN: [id number] Visit Date: 08/07/2017              Requested by: Renford Dills, MD [address] [place], [place] [address] PCP: Renford Dills, MD   Assessment & Plan: Visit Diagnoses:  1. Pain of right hip joint   2. Unilateral primary osteoarthritis, right hip     Plan: We had a long and thorough discussion about hip replacement surgery and a thorough discussion of the risk and benefits of the surgery. I went over his x-rays with him in detail as well as a hip model and explained what is intraoperative and postoperative courses involve. We talked about this in length. All questions and concerns were answered and addressed. We will work on getting this scheduled at his convenience.  Follow-Up Instructions: Return for 2 weeks post-op.   Orders:  No orders of the defined types were placed in this encounter.  No orders of the defined types were placed in this encounter.     Procedures: No procedures performed   Clinical Data: No additional findings.   Subjective: No chief complaint on file. The patient is someone that I'm seeing for the first time due to debilitating right hip pain. This is slowly gotten worse for over a two-year period time and he has had 2 years of conservative treatment including activity modification, anti-inflammatories, and anti-inflammatories. It hurts in the right groin. It is detrimentally affecting at this point his activities daily living, his quality of life, and his mobility. He's been sent to [place] to consider hip replacement surgery given the failure of several years of conservative treatment modalities. He denies any other issues as a relates to his right hip pain. He has no other active medical problems.  HPI  Review of Systems He currently denies any headache, chest pain, shortness of breath, fever, chills, nausea,  vomiting.  Objective: Vital Signs: There were no vitals taken for this visit.  Physical Exam He is alert and oriented 3 and in no acute distress Ortho Exam Examination of his left hip is normal. Examination of his right hip shows significant pain with extremes of internal/external rotation. Of note his leg lengths are equal. Specialty Comments:  No specialty comments available.  Imaging: No results found. X-rays on the canopy system do show osteoarthritis of the right hip. I've independent reviewed this. There are periarticular osteophytes and significant narrowing of the superior lateral joint space. There are sclerotic changes well. On x-ray appears like his right leg is actually longer than his left but this is not correlate with his clinical exam.  PMFS History: Patient Active Problem List   Diagnosis Date Noted  . Pain of right hip joint 08/07/2017  . Unilateral primary osteoarthritis, right hip 08/07/2017  . BPH (benign prostatic hypertrophy) with urinary obstruction 05/15/2013   Past Medical History:  Diagnosis Date  . Dysrhythmia    frequent PAC  . Hemorrhoids   . Hyperlipidemia   . Seasonal allergies     Family History  Problem Relation Age of Onset  . Heart disease Mother   . Coronary artery disease Mother   . Aneurysm Father   . Diabetes Sister     Past Surgical History:  Procedure Laterality Date  . CATARACT EXTRACTION Bilateral 2009  . HERNIA REPAIR Bilateral 1999,  2006  . pheochromocytoma  1993  . PROSTATECTOMY N/A 05/15/2013   Procedure: PROSTATECTOMY RETROPUBIC; SIMPLE OPEN PROSTATECTOMY;  Surgeon: Valetta Fuller, MD;  Location: [facility];  Service: Urology;  Laterality: N/A;   Social History   Occupational History  . Not on file.   Social History Main Topics  . Smoking status: Never Smoker  . Smokeless tobacco: Never Used  . Alcohol use Yes     Comment: 2 or 3 drinks on weekends  . Drug use: No  . Sexual activity: Not on file

## 2017-08-25 NOTE — Progress Notes (Signed)
Please place orders in EPIC as patient is being scheduled for a pre-op appointment! Thank you! 

## 2017-08-29 DIAGNOSIS — D2271 Melanocytic nevi of right lower limb, including hip: Secondary | ICD-10-CM | POA: Diagnosis not present

## 2017-08-29 DIAGNOSIS — D225 Melanocytic nevi of trunk: Secondary | ICD-10-CM | POA: Diagnosis not present

## 2017-08-29 DIAGNOSIS — D1801 Hemangioma of skin and subcutaneous tissue: Secondary | ICD-10-CM | POA: Diagnosis not present

## 2017-08-29 DIAGNOSIS — L821 Other seborrheic keratosis: Secondary | ICD-10-CM | POA: Diagnosis not present

## 2017-08-29 NOTE — Progress Notes (Signed)
Please place orders in EPIC as patient has a pre-op appointment on 08/31/2017!! Thank you! 

## 2017-08-30 ENCOUNTER — Encounter (HOSPITAL_COMMUNITY): Payer: Self-pay

## 2017-08-30 NOTE — Patient Instructions (Addendum)
James Estrada  08/30/2017   Your procedure is scheduled on: 09/08/17   Report to [facility] Main  Entrance    Report to admitting at 8:30 AM   Call this number if you have problems the morning of surgery  [phone]   Remember: ONLY 1 PERSON MAY GO WITH YOU TO SHORT STAY TO GET  READY MORNING OF YOUR SURGERY.   Do not eat food or drink liquids :After Midnight.     Take these medicines the morning of surgery with A SIP OF WATER: None                               You may not have any metal on your body including hair pins and              piercings  Do not wear jewelry, lotions, powders or perfumes, deodorant             Do not wear nail polish.                Men may shave face and neck.   Do not bring valuables to the hospital. [facility] IS NOT             RESPONSIBLE   FOR VALUABLES.  Contacts, dentures or bridgework may not be worn into surgery.  Leave suitcase in the car. After surgery it may be brought to your room.                Please read over the following fact sheets you were given: _____________________________________________________________________             [facility] - Preparing for Surgery Before surgery, you can play an important role.  Because skin is not sterile, your skin needs to be as free of germs as possible.  You can reduce the number of germs on your skin by washing with CHG (chlorahexidine gluconate) soap before surgery.  CHG is an antiseptic cleaner which kills germs and bonds with the skin to continue killing germs even after washing. Please DO NOT use if you have an allergy to CHG or antibacterial soaps.  If your skin becomes reddened/irritated stop using the CHG and inform your nurse when you arrive at Short Stay. Do not shave (including legs and underarms) for at least 48 hours prior to the first CHG shower.  You may shave your face/neck. Please follow these instructions carefully:  1.  Shower with CHG Soap  the night before surgery and the  morning of Surgery.  2.  If you choose to wash your hair, wash your hair first as usual with your  normal  shampoo.  3.  After you shampoo, rinse your hair and body thoroughly to remove the  shampoo.                           4.  Use CHG as you would any other liquid soap.  You can apply chg directly  to the skin and wash                       Gently with a scrungie or clean washcloth.  5.  Apply the CHG Soap to your body ONLY FROM THE NECK DOWN.   Do not use  on face/ open                           Wound or open sores. Avoid contact with eyes, ears mouth and genitals (private parts).                       Wash face,  Genitals (private parts) with your normal soap.             6.  Wash thoroughly, paying special attention to the area where your surgery  will be performed.  7.  Thoroughly rinse your body with warm water from the neck down.  8.  DO NOT shower/wash with your normal soap after using and rinsing off  the CHG Soap.                9.  Pat yourself dry with a clean towel.            10.  Wear clean pajamas.            11.  Place clean sheets on your bed the night of your first shower and do not  sleep with pets. Day of Surgery : Do not apply any lotions/deodorants the morning of surgery.  Please wear clean clothes to the hospital/surgery center.  FAILURE TO FOLLOW THESE INSTRUCTIONS MAY RESULT IN THE CANCELLATION OF YOUR SURGERY PATIENT SIGNATURE_________________________________  NURSE SIGNATURE__________________________________  ________________________________________________________________________  WHAT IS A BLOOD TRANSFUSION? Blood Transfusion Information  A transfusion is the replacement of blood or some of its parts. Blood is made up of multiple cells which provide different functions.  Red blood cells carry oxygen and are used for blood loss replacement.  White blood cells fight against infection.  Platelets control bleeding.  Plasma  helps clot blood.  Other blood products are available for specialized needs, such as hemophilia or other clotting disorders. BEFORE THE TRANSFUSION  Who gives blood for transfusions?   Healthy volunteers who are fully evaluated to make sure their blood is safe. This is blood bank blood. Transfusion therapy is the safest it has ever been in the practice of medicine. Before blood is taken from a donor, a complete history is taken to make sure that person has no history of diseases nor engages in risky social behavior (examples are intravenous drug use or sexual activity with multiple partners). The donor's travel history is screened to minimize risk of transmitting infections, such as malaria. The donated blood is tested for signs of infectious diseases, such as HIV and hepatitis. The blood is then tested to be sure it is compatible with you in order to minimize the chance of a transfusion reaction. If you or a relative donates blood, this is often done in anticipation of surgery and is not appropriate for emergency situations. It takes many days to process the donated blood. RISKS AND COMPLICATIONS Although transfusion therapy is very safe and saves many lives, the main dangers of transfusion include:   Getting an infectious disease.  Developing a transfusion reaction. This is an allergic reaction to something in the blood you were given. Every precaution is taken to prevent this. The decision to have a blood transfusion has been considered carefully by your caregiver before blood is given. Blood is not given unless the benefits outweigh the risks. AFTER THE TRANSFUSION  Right after receiving a blood transfusion, you will usually feel much better and more energetic. This is especially true if your  red blood cells have gotten low (anemic). The transfusion raises the level of the red blood cells which carry oxygen, and this usually causes an energy increase.  The nurse administering the transfusion  will monitor you carefully for complications. HOME CARE INSTRUCTIONS  No special instructions are needed after a transfusion. You may find your energy is better. Speak with your caregiver about any limitations on activity for underlying diseases you may have. SEEK MEDICAL CARE IF:   Your condition is not improving after your transfusion.  You develop redness or irritation at the intravenous (IV) site. SEEK IMMEDIATE MEDICAL CARE IF:  Any of the following symptoms occur over the next 12 hours:  Shaking chills.  You have a temperature by mouth above 102 F (38.9 C), not controlled by medicine.  Chest, back, or muscle pain.  People around you feel you are not acting correctly or are confused.  Shortness of breath or difficulty breathing.  Dizziness and fainting.  You get a rash or develop hives.  You have a decrease in urine output.  Your urine turns a dark color or changes to pink, red, or brown. Any of the following symptoms occur over the next 10 days:  You have a temperature by mouth above 102 F (38.9 C), not controlled by medicine.  Shortness of breath.  Weakness after normal activity.  The white part of the eye turns yellow (jaundice).  You have a decrease in the amount of urine or are urinating less often.  Your urine turns a dark color or changes to pink, red, or brown. Document Released: 10/28/2000 Document Revised: 01/23/2012 Document Reviewed: 06/16/2008 ExitCare Patient Information 2014 [place].  _______________________________________________________________________   Incentive Spirometer  An incentive spirometer is a tool that can help keep your lungs clear and active. This tool measures how well you are filling your lungs with each breath. Taking long deep breaths may help reverse or decrease the chance of developing breathing (pulmonary) problems (especially infection) following:  A long period of time when you are unable to move or be  active. BEFORE THE PROCEDURE   If the spirometer includes an indicator to show your best effort, your nurse or respiratory therapist will set it to a desired goal.  If possible, sit up straight or lean slightly forward. Try not to slouch.  Hold the incentive spirometer in an upright position. INSTRUCTIONS FOR USE  1. Sit on the edge of your bed if possible, or sit up as far as you can in bed or on a chair. 2. Hold the incentive spirometer in an upright position. 3. Breathe out normally. 4. Place the mouthpiece in your mouth and seal your lips tightly around it. 5. Breathe in slowly and as deeply as possible, raising the piston or the ball toward the top of the column. 6. Hold your breath for 3-5 seconds or for as long as possible. Allow the piston or ball to fall to the bottom of the column. 7. Remove the mouthpiece from your mouth and breathe out normally. 8. Rest for a few seconds and repeat Steps 1 through 7 at least 10 times every 1-2 hours when you are awake. Take your time and take a few normal breaths between deep breaths. 9. The spirometer may include an indicator to show your best effort. Use the indicator as a goal to work toward during each repetition. 10. After each set of 10 deep breaths, practice coughing to be sure your lungs are clear. If you have an incision (  the cut made at the time of surgery), support your incision when coughing by placing a pillow or rolled up towels firmly against it. Once you are able to get out of bed, walk around indoors and cough well. You may stop using the incentive spirometer when instructed by your caregiver.  RISKS AND COMPLICATIONS  Take your time so you do not get dizzy or light-headed.  If you are in pain, you may need to take or ask for pain medication before doing incentive spirometry. It is harder to take a deep breath if you are having pain. AFTER USE  Rest and breathe slowly and easily.  It can be helpful to keep track of a log of  your progress. Your caregiver can provide you with a simple table to help with this. If you are using the spirometer at home, follow these instructions: SEEK MEDICAL CARE IF:   You are having difficultly using the spirometer.  You have trouble using the spirometer as often as instructed.  Your pain medication is not giving enough relief while using the spirometer.  You develop fever of 100.5 F (38.1 C) or higher. SEEK IMMEDIATE MEDICAL CARE IF:   You cough up bloody sputum that had not been present before.  You develop fever of 102 F (38.9 C) or greater.  You develop worsening pain at or near the incision site. MAKE SURE YOU:   Understand these instructions.  Will watch your condition.  Will get help right away if you are not doing well or get worse. Document Released: 03/13/2007 Document Revised: 01/23/2012 Document Reviewed: 05/14/2007 ExitCare Patient Information 2014 [employer]Care, [place].   ________________________________________________________________________   Incentive Spirometer  An incentive spirometer is a tool that can help keep your lungs clear and active. This tool measures how well you are filling your lungs with each breath. Taking long deep breaths may help reverse or decrease the chance of developing breathing (pulmonary) problems (especially infection) following:  A long period of time when you are unable to move or be active. BEFORE THE PROCEDURE   If the spirometer includes an indicator to show your best effort, your nurse or respiratory therapist will set it to a desired goal.  If possible, sit up straight or lean slightly forward. Try not to slouch.  Hold the incentive spirometer in an upright position. INSTRUCTIONS FOR USE  11. Sit on the edge of your bed if possible, or sit up as far as you can in bed or on a chair. 12. Hold the incentive spirometer in an upright position. 13. Breathe out normally. 14. Place the mouthpiece in your mouth and  seal your lips tightly around it. 15. Breathe in slowly and as deeply as possible, raising the piston or the ball toward the top of the column. 16. Hold your breath for 3-5 seconds or for as long as possible. Allow the piston or ball to fall to the bottom of the column. 17. Remove the mouthpiece from your mouth and breathe out normally. 18. Rest for a few seconds and repeat Steps 1 through 7 at least 10 times every 1-2 hours when you are awake. Take your time and take a few normal breaths between deep breaths. 19. The spirometer may include an indicator to show your best effort. Use the indicator as a goal to work toward during each repetition. 20. After each set of 10 deep breaths, practice coughing to be sure your lungs are clear. If you have an incision (the cut made at the  time of surgery), support your incision when coughing by placing a pillow or rolled up towels firmly against it. Once you are able to get out of bed, walk around indoors and cough well. You may stop using the incentive spirometer when instructed by your caregiver.  RISKS AND COMPLICATIONS  Take your time so you do not get dizzy or light-headed.  If you are in pain, you may need to take or ask for pain medication before doing incentive spirometry. It is harder to take a deep breath if you are having pain. AFTER USE  Rest and breathe slowly and easily.  It can be helpful to keep track of a log of your progress. Your caregiver can provide you with a simple table to help with this. If you are using the spirometer at home, follow these instructions: SEEK MEDICAL CARE IF:   You are having difficultly using the spirometer.  You have trouble using the spirometer as often as instructed.  Your pain medication is not giving enough relief while using the spirometer.  You develop fever of 100.5 F (38.1 C) or higher. SEEK IMMEDIATE MEDICAL CARE IF:   You cough up bloody sputum that had not been present before.  You develop  fever of 102 F (38.9 C) or greater.  You develop worsening pain at or near the incision site. MAKE SURE YOU:   Understand these instructions.  Will watch your condition.  Will get help right away if you are not doing well or get worse. Document Released: 03/13/2007 Document Revised: 01/23/2012 Document Reviewed: 05/14/2007 [facility] Patient Information 2014 [place], [place].   ________________________________________________________________________

## 2017-08-30 NOTE — Progress Notes (Signed)
I am requesting pre-op orders for Mr. Clydene FakeHannaum pre op appointment 08/31/17 at 1PM.  Thank you, Rockwell Alexandriaamika Seville Downs, BSN, RN

## 2017-08-30 NOTE — Progress Notes (Signed)
Please place orders in EPIC as patient has a pre-op appointment on 08/31/2017!! Thank you! 

## 2017-08-31 ENCOUNTER — Encounter (HOSPITAL_COMMUNITY)
Admission: RE | Admit: 2017-08-31 | Discharge: 2017-08-31 | Disposition: A | Discharge status: Home / Self Care | Payer: Medicare Other | Source: Ambulatory Visit | Attending: Orthopaedic Surgery | Admitting: Orthopaedic Surgery

## 2017-08-31 ENCOUNTER — Other Ambulatory Visit (INDEPENDENT_AMBULATORY_CARE_PROVIDER_SITE_OTHER): Payer: Self-pay | Admitting: Physician Assistant

## 2017-08-31 ENCOUNTER — Encounter (HOSPITAL_COMMUNITY): Payer: Self-pay

## 2017-08-31 DIAGNOSIS — M1611 Unilateral primary osteoarthritis, right hip: Secondary | ICD-10-CM | POA: Diagnosis not present

## 2017-08-31 DIAGNOSIS — Z01818 Encounter for other preprocedural examination: Secondary | ICD-10-CM | POA: Insufficient documentation

## 2017-08-31 HISTORY — DX: Abdominal aortic aneurysm, without rupture, unspecified: I71.40

## 2017-08-31 HISTORY — DX: Bradycardia, unspecified: R00.1

## 2017-08-31 HISTORY — DX: Gastro-esophageal reflux disease without esophagitis: K21.9

## 2017-08-31 HISTORY — DX: Unspecified osteoarthritis, unspecified site: M19.90

## 2017-08-31 HISTORY — DX: Abdominal aortic aneurysm, without rupture: I71.4

## 2017-08-31 LAB — BASIC METABOLIC PANEL
ANION GAP: 8 (ref 5–15)
BUN: 22 mg/dL — ABNORMAL HIGH (ref 6–20)
CHLORIDE: 105 mmol/L (ref 101–111)
CO2: 27 mmol/L (ref 22–32)
Calcium: 9.2 mg/dL (ref 8.9–10.3)
Creatinine, Ser: 1.07 mg/dL (ref 0.61–1.24)
GFR calc Af Amer: >60 mL/min (ref >60)
GLUCOSE: 120 mg/dL — AB (ref 65–99)
POTASSIUM: 4.1 mmol/L (ref 3.5–5.1)
Sodium: 140 mmol/L (ref 135–145)

## 2017-08-31 LAB — CBC
HEMATOCRIT: 44.8 % (ref 39.0–52.0)
HEMOGLOBIN: 15.1 g/dL (ref 13.0–17.0)
MCH: 28.5 pg (ref 26.0–34.0)
MCHC: 33.7 g/dL (ref 30.0–36.0)
MCV: 84.7 fL (ref 78.0–100.0)
Platelets: 141 10*3/uL — ABNORMAL LOW (ref 150–400)
RBC: 5.29 MIL/uL (ref 4.22–5.81)
RDW: 12.8 % (ref 11.5–15.5)
WBC: 8.6 10*3/uL (ref 4.0–10.5)

## 2017-08-31 NOTE — Pre-Procedure Instructions (Signed)
CBC and BMP 08/31/17 results sent to Dr. Magnus IvanBlackman via epic.

## 2017-09-01 ENCOUNTER — Encounter (HOSPITAL_COMMUNITY): Payer: Self-pay

## 2017-09-01 LAB — SURGICAL PCR SCREEN
MRSA, PCR: POSITIVE — AB
Staphylococcus aureus: POSITIVE — AB

## 2017-09-01 NOTE — Progress Notes (Addendum)
  07/06/17-ekg-on chart from office of Dr Renford Dillsonald Polite  07/13/17 - Ddr Polite office vist on chart  07/06/17-Dr Polite office visit on chart  U/S of Aorta- 07/18/17

## 2017-09-07 ENCOUNTER — Other Ambulatory Visit (INDEPENDENT_AMBULATORY_CARE_PROVIDER_SITE_OTHER): Payer: Self-pay | Admitting: Orthopaedic Surgery

## 2017-09-07 DIAGNOSIS — M1611 Unilateral primary osteoarthritis, right hip: Secondary | ICD-10-CM

## 2017-09-07 MED ORDER — VANCOMYCIN HCL IN DEXTROSE 1-5 GM/200ML-% IV SOLN
1000.0000 mg | Freq: Once | INTRAVENOUS | Status: AC
  Administered 2017-09-08: 1000 mg via INTRAVENOUS

## 2017-09-07 NOTE — Progress Notes (Signed)
Message left for Dr Magnus IvanBlackman and Bronson CurbGil that PCR is MRSA + and need a  Vancomycin order for scheduled surgery  tomorrow 09/08/17.

## 2017-09-08 ENCOUNTER — Inpatient Hospital Stay (HOSPITAL_COMMUNITY): Payer: Medicare Other | Admitting: Certified Registered"

## 2017-09-08 ENCOUNTER — Inpatient Hospital Stay (HOSPITAL_COMMUNITY): Payer: Medicare Other

## 2017-09-08 ENCOUNTER — Encounter (HOSPITAL_COMMUNITY): Payer: Self-pay | Admitting: Certified Registered"

## 2017-09-08 ENCOUNTER — Inpatient Hospital Stay (HOSPITAL_COMMUNITY)
Admission: RE | Admit: 2017-09-08 | Discharge: 2017-09-10 | DRG: 470 | Disposition: A | Discharge status: Home Health | Payer: Medicare Other | Source: Ambulatory Visit | Attending: Orthopaedic Surgery | Admitting: Orthopaedic Surgery

## 2017-09-08 ENCOUNTER — Encounter (HOSPITAL_COMMUNITY)
Admission: RE | Disposition: A | Discharge status: Home Health | Payer: Self-pay | Source: Ambulatory Visit | Attending: Orthopaedic Surgery

## 2017-09-08 DIAGNOSIS — E785 Hyperlipidemia, unspecified: Secondary | ICD-10-CM | POA: Diagnosis not present

## 2017-09-08 DIAGNOSIS — K219 Gastro-esophageal reflux disease without esophagitis: Secondary | ICD-10-CM | POA: Diagnosis present

## 2017-09-08 DIAGNOSIS — I10 Essential (primary) hypertension: Secondary | ICD-10-CM | POA: Diagnosis not present

## 2017-09-08 DIAGNOSIS — I739 Peripheral vascular disease, unspecified: Secondary | ICD-10-CM | POA: Diagnosis not present

## 2017-09-08 DIAGNOSIS — Z471 Aftercare following joint replacement surgery: Secondary | ICD-10-CM | POA: Diagnosis not present

## 2017-09-08 DIAGNOSIS — Z96641 Presence of right artificial hip joint: Secondary | ICD-10-CM

## 2017-09-08 DIAGNOSIS — Z419 Encounter for procedure for purposes other than remedying health state, unspecified: Secondary | ICD-10-CM

## 2017-09-08 DIAGNOSIS — M1611 Unilateral primary osteoarthritis, right hip: Secondary | ICD-10-CM | POA: Diagnosis present

## 2017-09-08 DIAGNOSIS — D696 Thrombocytopenia, unspecified: Secondary | ICD-10-CM | POA: Diagnosis not present

## 2017-09-08 HISTORY — PX: TOTAL HIP ARTHROPLASTY: SHX124

## 2017-09-08 LAB — TYPE AND SCREEN
ABO/RH(D): A POS
Antibody Screen: NEGATIVE

## 2017-09-08 SURGERY — ARTHROPLASTY, HIP, TOTAL, ANTERIOR APPROACH
Anesthesia: Spinal | Site: Hip | Laterality: Right

## 2017-09-08 MED ORDER — ONDANSETRON HCL 4 MG/2ML IJ SOLN: 4.0000 mg | Freq: Once | INTRAMUSCULAR | Status: DC | PRN

## 2017-09-08 MED ORDER — TRANEXAMIC ACID 1000 MG/10ML IV SOLN
1000.0000 mg | INTRAVENOUS | Status: AC
  Administered 2017-09-08: 1000 mg via INTRAVENOUS
  Filled 2017-09-08: qty 1100

## 2017-09-08 MED ORDER — SODIUM CHLORIDE 0.9 % IR SOLN
Status: DC | PRN
  Administered 2017-09-08: 1000 mL

## 2017-09-08 MED ORDER — PROPOFOL 500 MG/50ML IV EMUL
INTRAVENOUS | Status: DC | PRN
  Administered 2017-09-08: 100 ug/kg/min via INTRAVENOUS

## 2017-09-08 MED ORDER — PROPOFOL 10 MG/ML IV BOLUS
INTRAVENOUS | Status: AC
  Filled 2017-09-08: qty 20

## 2017-09-08 MED ORDER — METHOCARBAMOL 1000 MG/10ML IJ SOLN
500.0000 mg | Freq: Four times a day (QID) | INTRAVENOUS | Status: DC | PRN
  Filled 2017-09-08: qty 5

## 2017-09-08 MED ORDER — MENTHOL 3 MG MT LOZG: 1.0000 | LOZENGE | OROMUCOSAL | Status: DC | PRN

## 2017-09-08 MED ORDER — VANCOMYCIN HCL IN DEXTROSE 1-5 GM/200ML-% IV SOLN
INTRAVENOUS | Status: AC
  Filled 2017-09-08: qty 200

## 2017-09-08 MED ORDER — MIDAZOLAM HCL 5 MG/5ML IJ SOLN
INTRAMUSCULAR | Status: DC | PRN
  Administered 2017-09-08: 2 mg via INTRAVENOUS

## 2017-09-08 MED ORDER — CEFAZOLIN SODIUM-DEXTROSE 2-4 GM/100ML-% IV SOLN: 2.0000 g | INTRAVENOUS | Status: DC

## 2017-09-08 MED ORDER — SODIUM CHLORIDE 0.9 % IV SOLN
INTRAVENOUS | Status: DC
  Administered 2017-09-08: 75 mL/h via INTRAVENOUS

## 2017-09-08 MED ORDER — DOCUSATE SODIUM 100 MG PO CAPS
100.0000 mg | ORAL_CAPSULE | Freq: Two times a day (BID) | ORAL | Status: DC
  Administered 2017-09-08 – 2017-09-10 (×4): 100 mg via ORAL
  Filled 2017-09-08 (×4): qty 1

## 2017-09-08 MED ORDER — LACTATED RINGERS IV SOLN
INTRAVENOUS | Status: DC
  Administered 2017-09-08 (×3): via INTRAVENOUS

## 2017-09-08 MED ORDER — DEXAMETHASONE SODIUM PHOSPHATE 10 MG/ML IJ SOLN
INTRAMUSCULAR | Status: AC
  Filled 2017-09-08: qty 1

## 2017-09-08 MED ORDER — METOCLOPRAMIDE HCL 5 MG PO TABS: 5.0000 mg | ORAL_TABLET | Freq: Three times a day (TID) | ORAL | Status: DC | PRN

## 2017-09-08 MED ORDER — ACETAMINOPHEN 500 MG PO TABS
1000.0000 mg | ORAL_TABLET | Freq: Once | ORAL | Status: AC
  Administered 2017-09-08: 1000 mg via ORAL

## 2017-09-08 MED ORDER — VANCOMYCIN HCL IN DEXTROSE 1-5 GM/200ML-% IV SOLN
1000.0000 mg | Freq: Two times a day (BID) | INTRAVENOUS | Status: AC
  Administered 2017-09-08: 1000 mg via INTRAVENOUS
  Filled 2017-09-08: qty 200

## 2017-09-08 MED ORDER — ASPIRIN 81 MG PO CHEW
81.0000 mg | CHEWABLE_TABLET | Freq: Two times a day (BID) | ORAL | Status: DC
  Administered 2017-09-08 – 2017-09-10 (×3): 81 mg via ORAL
  Filled 2017-09-08 (×4): qty 1

## 2017-09-08 MED ORDER — DEXAMETHASONE SODIUM PHOSPHATE 10 MG/ML IJ SOLN
INTRAMUSCULAR | Status: DC | PRN
  Administered 2017-09-08: 10 mg via INTRAVENOUS

## 2017-09-08 MED ORDER — ATENOLOL 25 MG PO TABS
12.5000 mg | ORAL_TABLET | Freq: Every day | ORAL | Status: DC
  Administered 2017-09-08 – 2017-09-09 (×2): 12.5 mg via ORAL
  Filled 2017-09-08 (×3): qty 1

## 2017-09-08 MED ORDER — CEFAZOLIN SODIUM-DEXTROSE 2-4 GM/100ML-% IV SOLN
INTRAVENOUS | Status: AC
  Filled 2017-09-08: qty 100

## 2017-09-08 MED ORDER — FENTANYL CITRATE (PF) 100 MCG/2ML IJ SOLN: 25.0000 ug | INTRAMUSCULAR | Status: DC | PRN

## 2017-09-08 MED ORDER — EPHEDRINE 5 MG/ML INJ
INTRAVENOUS | Status: AC
  Filled 2017-09-08: qty 10

## 2017-09-08 MED ORDER — ACETAMINOPHEN 325 MG PO TABS: 650.0000 mg | ORAL_TABLET | ORAL | Status: DC | PRN

## 2017-09-08 MED ORDER — STERILE WATER FOR IRRIGATION IR SOLN
Status: DC | PRN
  Administered 2017-09-08: 2000 mL

## 2017-09-08 MED ORDER — FENTANYL CITRATE (PF) 100 MCG/2ML IJ SOLN
INTRAMUSCULAR | Status: AC
  Filled 2017-09-08: qty 2

## 2017-09-08 MED ORDER — OXYCODONE HCL 5 MG PO TABS
10.0000 mg | ORAL_TABLET | ORAL | Status: DC | PRN
  Administered 2017-09-08: 5 mg via ORAL
  Administered 2017-09-08 (×2): 10 mg via ORAL
  Administered 2017-09-08: 5 mg via ORAL
  Administered 2017-09-09 (×5): 10 mg via ORAL
  Filled 2017-09-08 (×8): qty 2

## 2017-09-08 MED ORDER — ONDANSETRON HCL 4 MG/2ML IJ SOLN
INTRAMUSCULAR | Status: DC | PRN
  Administered 2017-09-08: 4 mg via INTRAVENOUS

## 2017-09-08 MED ORDER — HYDROMORPHONE HCL 1 MG/ML IJ SOLN: 1.0000 mg | INTRAMUSCULAR | Status: DC | PRN

## 2017-09-08 MED ORDER — ONDANSETRON HCL 4 MG/2ML IJ SOLN
INTRAMUSCULAR | Status: AC
  Filled 2017-09-08: qty 2

## 2017-09-08 MED ORDER — METOCLOPRAMIDE HCL 5 MG/ML IJ SOLN: 5.0000 mg | Freq: Three times a day (TID) | INTRAMUSCULAR | Status: DC | PRN

## 2017-09-08 MED ORDER — EPHEDRINE SULFATE-NACL 50-0.9 MG/10ML-% IV SOSY
PREFILLED_SYRINGE | INTRAVENOUS | Status: DC | PRN
  Administered 2017-09-08 (×2): 10 mg via INTRAVENOUS

## 2017-09-08 MED ORDER — HYDROCODONE-ACETAMINOPHEN 7.5-325 MG PO TABS
1.0000 | ORAL_TABLET | ORAL | Status: DC | PRN
  Administered 2017-09-10: 1 via ORAL
  Filled 2017-09-08: qty 1

## 2017-09-08 MED ORDER — ALUM & MAG HYDROXIDE-SIMETH 200-200-20 MG/5ML PO SUSP: 30.0000 mL | ORAL | Status: DC | PRN

## 2017-09-08 MED ORDER — PHENOL 1.4 % MT LIQD: 1.0000 | OROMUCOSAL | Status: DC | PRN

## 2017-09-08 MED ORDER — LIDOCAINE 2% (20 MG/ML) 5 ML SYRINGE
INTRAMUSCULAR | Status: AC
  Filled 2017-09-08: qty 5

## 2017-09-08 MED ORDER — ONDANSETRON HCL 4 MG PO TABS: 4.0000 mg | ORAL_TABLET | Freq: Four times a day (QID) | ORAL | Status: DC | PRN

## 2017-09-08 MED ORDER — ACETAMINOPHEN 500 MG PO TABS
ORAL_TABLET | ORAL | Status: AC
  Filled 2017-09-08: qty 2

## 2017-09-08 MED ORDER — VANCOMYCIN HCL IN DEXTROSE 1-5 GM/200ML-% IV SOLN: 1000.0000 mg | Freq: Once | INTRAVENOUS | Status: DC

## 2017-09-08 MED ORDER — DIPHENHYDRAMINE HCL 12.5 MG/5ML PO ELIX: 12.5000 mg | ORAL_SOLUTION | ORAL | Status: DC | PRN

## 2017-09-08 MED ORDER — FENTANYL CITRATE (PF) 100 MCG/2ML IJ SOLN
INTRAMUSCULAR | Status: DC | PRN
  Administered 2017-09-08: 100 ug via INTRAVENOUS

## 2017-09-08 MED ORDER — ACETAMINOPHEN 650 MG RE SUPP: 650.0000 mg | RECTAL | Status: DC | PRN

## 2017-09-08 MED ORDER — BUPIVACAINE IN DEXTROSE 0.75-8.25 % IT SOLN
INTRATHECAL | Status: DC | PRN
  Administered 2017-09-08: 2 mL via INTRATHECAL

## 2017-09-08 MED ORDER — LIDOCAINE 2% (20 MG/ML) 5 ML SYRINGE
INTRAMUSCULAR | Status: DC | PRN
  Administered 2017-09-08: 50 mg via INTRAVENOUS

## 2017-09-08 MED ORDER — ONDANSETRON HCL 4 MG/2ML IJ SOLN
4.0000 mg | Freq: Four times a day (QID) | INTRAMUSCULAR | Status: DC | PRN
Start: 2017-09-08 — End: 2017-09-10

## 2017-09-08 MED ORDER — METHOCARBAMOL 500 MG PO TABS
500.0000 mg | ORAL_TABLET | Freq: Four times a day (QID) | ORAL | Status: DC | PRN
  Administered 2017-09-08 – 2017-09-09 (×3): 500 mg via ORAL
  Filled 2017-09-08 (×3): qty 1

## 2017-09-08 MED ORDER — MIDAZOLAM HCL 2 MG/2ML IJ SOLN
INTRAMUSCULAR | Status: AC
  Filled 2017-09-08: qty 2

## 2017-09-08 MED ORDER — POLYETHYLENE GLYCOL 3350 17 G PO PACK: 17.0000 g | PACK | Freq: Every day | ORAL | Status: DC | PRN

## 2017-09-08 MED ORDER — CHLORHEXIDINE GLUCONATE 4 % EX LIQD: 60.0000 mL | Freq: Once | CUTANEOUS | Status: DC

## 2017-09-08 SURGICAL SUPPLY — 39 items
BAG ZIPLOCK 12X15 (MISCELLANEOUS) IMPLANT
BENZOIN TINCTURE PRP APPL 2/3 (GAUZE/BANDAGES/DRESSINGS) ×3 IMPLANT
BLADE SAW SGTL 18X1.27X75 (BLADE) ×2 IMPLANT
BLADE SAW SGTL 18X1.27X75MM (BLADE) ×1
CAPT HIP TOTAL 2 ×3 IMPLANT
CELLS DAT CNTRL 66122 CELL SVR (MISCELLANEOUS) ×1 IMPLANT
CLOSURE WOUND 1/2 X4 (GAUZE/BANDAGES/DRESSINGS) ×1
COVER PERINEAL POST (MISCELLANEOUS) ×3 IMPLANT
COVER SURGICAL LIGHT HANDLE (MISCELLANEOUS) ×3 IMPLANT
DRAPE STERI IOBAN 125X83 (DRAPES) ×3 IMPLANT
DRAPE U-SHAPE 47X51 STRL (DRAPES) ×6 IMPLANT
DRESSING AQUACEL AG SP 3.5X10 (GAUZE/BANDAGES/DRESSINGS) ×1 IMPLANT
DRSG AQUACEL AG SP 3.5X10 (GAUZE/BANDAGES/DRESSINGS) ×3
DURAPREP 26ML APPLICATOR (WOUND CARE) ×3 IMPLANT
ELECT REM PT RETURN 15FT ADLT (MISCELLANEOUS) ×3 IMPLANT
GLOVE BIO SURGEON STRL SZ7.5 (GLOVE) ×3 IMPLANT
GLOVE BIOGEL PI IND STRL 7.5 (GLOVE) ×4 IMPLANT
GLOVE BIOGEL PI IND STRL 8 (GLOVE) ×2 IMPLANT
GLOVE BIOGEL PI INDICATOR 7.5 (GLOVE) ×8
GLOVE BIOGEL PI INDICATOR 8 (GLOVE) ×4
GLOVE ECLIPSE 8.0 STRL XLNG CF (GLOVE) ×3 IMPLANT
GLOVE SURG SS PI 7.0 STRL IVOR (GLOVE) ×3 IMPLANT
GLOVE SURG SS PI 7.5 STRL IVOR (GLOVE) ×3 IMPLANT
GOWN STRL REUS W/TWL XL LVL3 (GOWN DISPOSABLE) ×9 IMPLANT
HANDPIECE INTERPULSE COAX TIP (DISPOSABLE) ×2
HOLDER FOLEY CATH W/STRAP (MISCELLANEOUS) ×3 IMPLANT
PACK ANTERIOR HIP CUSTOM (KITS) ×3 IMPLANT
RTRCTR WOUND ALEXIS 18CM MED (MISCELLANEOUS) ×3
SET HNDPC FAN SPRY TIP SCT (DISPOSABLE) ×1 IMPLANT
STAPLER VISISTAT 35W (STAPLE) IMPLANT
STRIP CLOSURE SKIN 1/2X4 (GAUZE/BANDAGES/DRESSINGS) ×2 IMPLANT
SUT ETHIBOND NAB CT1 #1 30IN (SUTURE) ×3 IMPLANT
SUT MNCRL AB 4-0 PS2 18 (SUTURE) IMPLANT
SUT VIC AB 0 CT1 36 (SUTURE) ×3 IMPLANT
SUT VIC AB 1 CT1 36 (SUTURE) ×3 IMPLANT
SUT VIC AB 2-0 CT1 27 (SUTURE) ×4
SUT VIC AB 2-0 CT1 TAPERPNT 27 (SUTURE) ×2 IMPLANT
TRAY FOLEY W/METER SILVER 16FR (SET/KITS/TRAYS/PACK) ×3 IMPLANT
YANKAUER SUCT BULB TIP 10FT TU (MISCELLANEOUS) ×3 IMPLANT

## 2017-09-08 NOTE — Anesthesia Procedure Notes (Signed)
Spinal  Patient location during procedure: OR End time: 09/08/2017 10:43 AM Staffing Resident/CRNA: Noralyn Pick D Performed: resident/CRNA  Preanesthetic Checklist Completed: patient identified, site marked, surgical consent, pre-op evaluation, timeout performed, IV checked, risks and benefits discussed and monitors and equipment checked Spinal Block Patient position: sitting Prep: Betadine Patient monitoring: heart rate, continuous pulse ox and blood pressure Approach: midline Location: L2-3 Injection technique: single-shot Needle Needle type: Sprotte  Needle gauge: 24 G Needle length: 9 cm Assessment Sensory level: T6 Additional Notes Expiration date of kit checked and confirmed. Patient tolerated procedure well, without complications.

## 2017-09-08 NOTE — Anesthesia Postprocedure Evaluation (Signed)
Anesthesia Post Note  Patient: James Estrada  Procedure(s) Performed: RIGHT TOTAL HIP ARTHROPLASTY ANTERIOR APPROACH (Right Hip)     Patient location during evaluation: PACU Anesthesia Type: Spinal Level of consciousness: oriented and awake and alert Pain management: pain level controlled Vital Signs Assessment: post-procedure vital signs reviewed and stable Respiratory status: spontaneous breathing, respiratory function stable and nonlabored ventilation Cardiovascular status: blood pressure returned to baseline and stable Postop Assessment: no headache, no backache, no apparent nausea or vomiting and spinal receding Anesthetic complications: no    Last Vitals:  Vitals:   09/08/17 1315 09/08/17 1337  BP: 121/66 117/69  Pulse: (!) 50   Resp: 15 15  Temp: 36.5 C 36.5 C  SpO2: 100% 99%    Last Pain:  Vitals:   09/08/17 1415  TempSrc:   PainSc: 5                  Cecile HearingStephen Edward Cilicia Borden

## 2017-09-08 NOTE — Brief Op Note (Signed)
09/08/2017  12:03 PM  PATIENT:  James FritzGeorge E Estrada  76 y.o. male  PRE-OPERATIVE DIAGNOSIS:  Osteoarthritis right hip  POST-OPERATIVE DIAGNOSIS:  Osteoarthritis right hip  PROCEDURE:  Procedure(s): RIGHT TOTAL HIP ARTHROPLASTY ANTERIOR APPROACH (Right)  SURGEON:  Surgeon(s) and Role:    Kathryne Hitch* Blackman, Christopher Y, MD - Primary  PHYSICIAN ASSISTANT: Rexene EdisonGil Clark, PA-C   ANESTHESIA:   spinal  EBL:  550 mL   COUNTS:  YES  DICTATION: .Other Dictation: Dictation Number [id number]  PLAN OF CARE: Admit to inpatient   PATIENT DISPOSITION:  PACU - hemodynamically stable.   Delay start of Pharmacological VTE agent (>24hrs) due to surgical blood loss or risk of bleeding: no

## 2017-09-08 NOTE — Evaluation (Signed)
Physical Therapy Evaluation Patient Details Name: James Estrada MRN: [id number] DOB: October 21, 1941 Today's Date: 09/08/2017   History of Present Illness  Pt s/p R THR  Clinical Impression  Pt s/p R THR and presents with decreased R LE strength/ROM and post op pain limiting functional mobility.  Pt should progress to dc home with family assist.    Follow Up Recommendations Home health PT    Equipment Recommendations  None recommended by PT    Recommendations for Other Services OT consult     Precautions / Restrictions Precautions Precautions: Fall Restrictions Weight Bearing Restrictions: No Other Position/Activity Restrictions: WBAT      Mobility  Bed Mobility Overal bed mobility: Needs Assistance Bed Mobility: Supine to Sit     Supine to sit: Min assist     General bed mobility comments: cues for sequence and use of L LE to self assist  Transfers Overall transfer level: Needs assistance Equipment used: Rolling walker (2 wheeled) Transfers: Sit to/from Stand Sit to Stand: Min assist         General transfer comment: cues for LE management and use of UEs to self assist  Ambulation/Gait Ambulation/Gait assistance: Min assist Ambulation Distance (Feet): 45 Feet Assistive device: Rolling walker (2 wheeled) Gait Pattern/deviations: Step-to pattern;Decreased step length - right;Decreased step length - left;Shuffle;Trunk flexed Gait velocity: dec Gait velocity interpretation: Below normal speed for age/gender General Gait Details: cues for posture, position from RW and initial sequence  Stairs            Wheelchair Mobility    Modified Rankin (Stroke Patients Only)       Balance                                             Pertinent Vitals/Pain Pain Assessment: 0-10 Pain Score: 3  Pain Location: R hip Pain Descriptors / Indicators: Aching;Sore Pain Intervention(s): Limited activity within patient's tolerance;Monitored  during session;Premedicated before session;Ice applied    Home Living Family/patient expects to be discharged to:: Private residence Living Arrangements: Spouse/significant other Available Help at Discharge: Family Type of Home: House Home Access: Stairs to enter Entrance Stairs-Rails: Left Entrance Stairs-Number of Steps: 14 Home Layout: Two level Home Equipment: Environmental consultantWalker - 2 wheels;Cane - single point      Prior Function Level of Independence: Independent               Hand Dominance        Extremity/Trunk Assessment   Upper Extremity Assessment Upper Extremity Assessment: Overall WFL for tasks assessed    Lower Extremity Assessment Lower Extremity Assessment: RLE deficits/detail    Cervical / Trunk Assessment Cervical / Trunk Assessment: Normal  Communication   Communication: No difficulties  Cognition Arousal/Alertness: Awake/alert Behavior During Therapy: WFL for tasks assessed/performed Overall Cognitive Status: Within Functional Limits for tasks assessed                                        General Comments      Exercises Total Joint Exercises Ankle Circles/Pumps: AROM;Both;15 reps;Supine   Assessment/Plan    PT Assessment Patient needs continued PT services  PT Problem List Decreased strength;Decreased range of motion;Decreased activity tolerance;Decreased mobility;Decreased knowledge of use of DME;Pain;Decreased knowledge of precautions       PT  Treatment Interventions DME instruction;Gait training;Stair training;Functional mobility training;Therapeutic activities;Therapeutic exercise;Patient/family education    PT Goals (Current goals can be found in the Care Plan section)  Acute Rehab PT Goals Patient Stated Goal: Regain IND PT Goal Formulation: With patient Time For Goal Achievement: 09/12/17 Potential to Achieve Goals: Good    Frequency 7X/week   Barriers to discharge        Co-evaluation                AM-PAC PT "6 Clicks" Daily Activity  Outcome Measure Difficulty turning over in bed (including adjusting bedclothes, sheets and blankets)?: Unable Difficulty moving from lying on back to sitting on the side of the bed? : Unable Difficulty sitting down on and standing up from a chair with arms (e.g., wheelchair, bedside commode, etc,.)?: Unable Help needed moving to and from a bed to chair (including a wheelchair)?: A Little Help needed walking in hospital room?: A Little Help needed climbing 3-5 steps with a railing? : A Lot 6 Click Score: 11    End of Session Equipment Utilized During Treatment: Gait belt Activity Tolerance: Patient tolerated treatment well Patient left: in chair;with call bell/phone within reach;with chair alarm set Nurse Communication: Mobility status PT Visit Diagnosis: Unsteadiness on feet (R26.81);Difficulty in walking, not elsewhere classified (R26.2)    Time: 1635-1700 PT Time Calculation (min) (ACUTE ONLY): 25 min   Charges:   PT Evaluation $PT Eval Low Complexity: 1 Low PT Treatments $Gait Training: 8-22 mins   PT G Codes:        Pg [phone]    James Estrada 09/08/2017, 6:02 PM

## 2017-09-08 NOTE — Transfer of Care (Signed)
Immediate Anesthesia Transfer of Care Note  Patient: James FritzGeorge E Rubendall  Procedure(s) Performed: RIGHT TOTAL HIP ARTHROPLASTY ANTERIOR APPROACH (Right Hip)  Patient Location: PACU  Anesthesia Type:Spinal  Level of Consciousness: awake, alert  and oriented  Airway & Oxygen Therapy: Patient Spontanous Breathing and Patient connected to face mask oxygen  Post-op Assessment: Report given to RN and Post -op Vital signs reviewed and stable  Post vital signs: Reviewed and stable  Last Vitals:  Vitals:   09/08/17 0851  BP: (!) 143/87  Pulse: (!) 46  Resp: 18  Temp: 36.7 C  SpO2: 98%    Last Pain:  Vitals:   09/08/17 0851  TempSrc: Oral         Complications: No apparent anesthesia complications

## 2017-09-08 NOTE — H&P (Signed)
TOTAL HIP ADMISSION H&P  Patient is admitted for righttotal hip arthroplasty.  Subjective:  Chief Complaint: right hip pain  HPI: James Estrada, 76 y.o. male, has a history of pain and functional disability in the right hip(s) due to arthritis and patient has failed non-surgical conservative treatments for greater than 12 weeks to include NSAID's and/or analgesics, flexibility and strengthening excercises and activity modification.  Onset of symptoms was gradual starting 2 years ago with gradually worsening course since that time.The patient noted no past surgery on the right hip(s).  Patient currently rates pain in the right hip at 10 out of 10 with activity. Patient has night pain, worsening of pain with activity and weight bearing, trendelenberg gait, pain that interfers with activities of daily living, pain with passive range of motion and crepitus. Patient has evidence of subchondral cysts, subchondral sclerosis, periarticular osteophytes and joint space narrowing by imaging studies. This condition presents safety issues increasing the risk of falls.  There is no current active infection.  Patient Active Problem List   Diagnosis Date Noted  . Pain of right hip joint 08/07/2017  . Unilateral primary osteoarthritis, right hip 08/07/2017  . BPH (benign prostatic hypertrophy) with urinary obstruction 05/15/2013   Past Medical History:  Diagnosis Date  . AAA (abdominal aortic aneurysm) (HCC)    last u/s done 07/18/17   . Arthritis   . Bradycardia   . Dysrhythmia    frequent PAC, for 20 years  . GERD (gastroesophageal reflux disease)    occ, OTC  . Hemorrhoids   . Hyperlipidemia   . Hypertension   . Seasonal allergies     Past Surgical History:  Procedure Laterality Date  . CATARACT EXTRACTION Bilateral 2009  . HERNIA REPAIR Bilateral 1999, 2006  . pheochromocytoma  1993  . PROSTATECTOMY N/A 05/15/2013   Procedure: PROSTATECTOMY RETROPUBIC; SIMPLE OPEN PROSTATECTOMY;  Surgeon:  Valetta Fulleravid S Grapey, MD;  Location: [facility];  Service: Urology;  Laterality: N/A;    Current Facility-Administered Medications  Medication Dose Route Frequency Provider Last Rate Last Dose  . acetaminophen (TYLENOL) 500 MG tablet           . ceFAZolin (ANCEF) 2-4 GM/100ML-% IVPB           . chlorhexidine (HIBICLENS) 4 % liquid 4 application  60 mL Topical Once Kirtland Bouchardlark, Gilbert W, PA-C      . lactated ringers infusion   Intravenous Continuous Cecile Hearingurk, Stephen Edward, MD 100 mL/hr at 09/08/17 0909    . tranexamic acid (CYKLOKAPRON) 1,000 mg in sodium chloride 0.9 % 100 mL IVPB  1,000 mg Intravenous To OR Kirtland Bouchardlark, Gilbert W, PA-C      . vancomycin (VANCOCIN) 1-5 GM/200ML-% IVPB           . vancomycin (VANCOCIN) IVPB 1000 mg/200 mL premix  1,000 mg Intravenous Once Kathryne HitchBlackman, Prescilla Monger Y, MD       Facility-Administered Medications Ordered in Other Encounters  Medication Dose Route Frequency Provider Last Rate Last Dose  . vancomycin (VANCOCIN) IVPB 1000 mg/200 mL premix  1,000 mg Intravenous Once Kathryne HitchBlackman, Pocahontas Cohenour Y, MD       No Known Allergies  Social History  Substance Use Topics  . Smoking status: Never Smoker  . Smokeless tobacco: Never Used  . Alcohol use Yes     Comment: 2 or 3 drinks on weekends    Family History  Problem Relation Age of Onset  . Heart disease Mother   . Coronary artery disease Mother   .  Aneurysm Father   . Diabetes Sister      Review of Systems  Musculoskeletal: Positive for joint pain.  All other systems reviewed and are negative.   Objective:  Physical Exam  Constitutional: He is oriented to person, place, and time. He appears well-developed and well-nourished.  HENT:  Head: Normocephalic and atraumatic.  Eyes: Pupils are equal, round, and reactive to light. EOM are normal.  Neck: Normal range of motion. Neck supple.  Cardiovascular: Normal rate and regular rhythm.   Respiratory: Effort normal and breath sounds normal.  GI: Soft. Bowel sounds are normal.   Musculoskeletal:       Left hip: He exhibits decreased range of motion, decreased strength, tenderness and bony tenderness.  Neurological: He is alert and oriented to person, place, and time.  Skin: Skin is warm and dry.  Psychiatric: He has a normal mood and affect.    Vital signs in last 24 hours: Temp:  [98.1 F (36.7 C)] 98.1 F (36.7 C) (10/26 0851) Pulse Rate:  [46] 46 (10/26 0851) Resp:  [18] 18 (10/26 0851) BP: (143)/(87) 143/87 (10/26 0851) SpO2:  [98 %] 98 % (10/26 0851)  Labs:   Estimated body mass index is 26.85 kg/m as calculated from the following:   Height as of 08/31/17: 6' (1.829 m).   Weight as of 08/31/17: 198 lb (89.8 kg).   Imaging Review Plain radiographs demonstrate severe degenerative joint disease of the right hip(s). The bone quality appears to be excellent for age and reported activity level.  Assessment/Plan:  End stage arthritis, right hip(s)  The patient history, physical examination, clinical judgement of the provider and imaging studies are consistent with end stage degenerative joint disease of the right hip(s) and total hip arthroplasty is deemed medically necessary. The treatment options including medical management, injection therapy, arthroscopy and arthroplasty were discussed at length. The risks and benefits of total hip arthroplasty were presented and reviewed. The risks due to aseptic loosening, infection, stiffness, dislocation/subluxation,  thromboembolic complications and other imponderables were discussed.  The patient acknowledged the explanation, agreed to proceed with the plan and consent was signed. Patient is being admitted for inpatient treatment for surgery, pain control, PT, OT, prophylactic antibiotics, VTE prophylaxis, progressive ambulation and ADL's and discharge planning.The patient is planning to be discharged home with home health services

## 2017-09-08 NOTE — Anesthesia Preprocedure Evaluation (Addendum)
Anesthesia Evaluation  Patient identified by MRN, date of birth, ID band Patient awake    Reviewed: Allergy & Precautions, NPO status , Patient's Chart, lab work & pertinent test results, reviewed documented beta blocker date and time   Airway Mallampati: II  TM Distance: >3 FB Neck ROM: Full    Dental  (+) Teeth Intact, Dental Advisory Given, Caps Upper permanent bridge:   Pulmonary neg pulmonary ROS,    Pulmonary exam normal breath sounds clear to auscultation       Cardiovascular hypertension, Pt. on home beta blockers + Peripheral Vascular Disease (AAA)  Normal cardiovascular exam Rhythm:Regular Rate:Normal     Neuro/Psych negative neurological ROS     GI/Hepatic Neg liver ROS, GERD  Medicated,  Endo/Other  negative endocrine ROS  Renal/GU negative Renal ROS     Musculoskeletal  (+) Arthritis , Osteoarthritis,    Abdominal   Peds  Hematology  (+) Blood dyscrasia (Thrombocytopenia--Plt 141k), ,   Anesthesia Other Findings Day of surgery medications reviewed with the patient.  Reproductive/Obstetrics                            Anesthesia Physical Anesthesia Plan  ASA: II  Anesthesia Plan: Spinal   Post-op Pain Management:    Induction:   PONV Risk Score and Plan: 1 and Ondansetron and Dexamethasone  Airway Management Planned: Natural Airway  Additional Equipment:   Intra-op Plan:   Post-operative Plan:   Informed Consent: I have reviewed the patients History and Physical, chart, labs and discussed the procedure including the risks, benefits and alternatives for the proposed anesthesia with the patient or authorized representative who has indicated his/her understanding and acceptance.   Dental advisory given  Plan Discussed with: CRNA, Anesthesiologist and Surgeon  Anesthesia Plan Comments: (Discussed risks and benefits of and differences between spinal and general.  Discussed risks of spinal including headache, backache, failure, bleeding, infection, and nerve damage. Patient consents to spinal. Questions answered. Coagulation studies and platelet count acceptable.)        Anesthesia Quick Evaluation

## 2017-09-09 LAB — CBC
HCT: 40.5 % (ref 39.0–52.0)
Hemoglobin: 13.3 g/dL (ref 13.0–17.0)
MCH: 28.2 pg (ref 26.0–34.0)
MCHC: 32.8 g/dL (ref 30.0–36.0)
MCV: 86 fL (ref 78.0–100.0)
Platelets: 145 10*3/uL — ABNORMAL LOW (ref 150–400)
RBC: 4.71 MIL/uL (ref 4.22–5.81)
RDW: 13.2 % (ref 11.5–15.5)
WBC: 15.7 10*3/uL — ABNORMAL HIGH (ref 4.0–10.5)

## 2017-09-09 LAB — BASIC METABOLIC PANEL
Anion gap: 9 (ref 5–15)
BUN: 13 mg/dL (ref 6–20)
CO2: 26 mmol/L (ref 22–32)
Calcium: 8.9 mg/dL (ref 8.9–10.3)
Chloride: 105 mmol/L (ref 101–111)
Creatinine, Ser: 0.99 mg/dL (ref 0.61–1.24)
GFR calc Af Amer: >60 mL/min (ref >60)
GFR calc non Af Amer: >60 mL/min (ref >60)
Glucose, Bld: 139 mg/dL — ABNORMAL HIGH (ref 65–99)
Potassium: 4.1 mmol/L (ref 3.5–5.1)
Sodium: 140 mmol/L (ref 135–145)

## 2017-09-09 MED ORDER — OXYCODONE-ACETAMINOPHEN 5-325 MG PO TABS: 1.0000 | ORAL_TABLET | ORAL | 0 refills | Status: DC | PRN

## 2017-09-09 MED ORDER — ASPIRIN 81 MG PO CHEW: 81.0000 mg | CHEWABLE_TABLET | Freq: Two times a day (BID) | ORAL | 0 refills | Status: DC

## 2017-09-09 NOTE — Discharge Instructions (Signed)

## 2017-09-09 NOTE — Progress Notes (Signed)
Physical Therapy Treatment Patient Details Name: James FritzGeorge E Estrada MRN: [id number] DOB: 1941-04-27 Today's Date: 09/09/2017    History of Present Illness Pt s/p R THR    PT Comments    Pt very motivated and progressing well with mobility including advancing to reciprocal gait and initiating stair training..   Follow Up Recommendations  Home health PT     Equipment Recommendations  None recommended by PT    Recommendations for Other Services OT consult     Precautions / Restrictions Precautions Precautions: Fall Restrictions Weight Bearing Restrictions: No Other Position/Activity Restrictions: WBAT    Mobility  Bed Mobility               General bed mobility comments: Pt up in recliner on arrival and requests back to same  Transfers Overall transfer level: Needs assistance Equipment used: Rolling walker (2 wheeled) Transfers: Sit to/from Stand Sit to Stand: Min guard         General transfer comment: Pt self cueing  Ambulation/Gait Ambulation/Gait assistance: Min guard Ambulation Distance (Feet): 260 Feet Assistive device: Rolling walker (2 wheeled) Gait Pattern/deviations: Decreased step length - right;Decreased step length - left;Shuffle;Trunk flexed;Step-to pattern;Step-through pattern Gait velocity: dec Gait velocity interpretation: Below normal speed for age/gender General Gait Details: cues for posture, position from RW and initial sequence   Stairs Stairs: Yes   Stair Management: One rail Left;Step to pattern;Forwards;With cane Number of Stairs: 4 General stair comments: cues for sequence and foot/RW placement  Wheelchair Mobility    Modified Rankin (Stroke Patients Only)       Balance                                            Cognition Arousal/Alertness: Awake/alert Behavior During Therapy: WFL for tasks assessed/performed Overall Cognitive Status: Within Functional Limits for tasks assessed                                         Exercises      General Comments        Pertinent Vitals/Pain Pain Assessment: 0-10 Pain Score: 4  Pain Location: R hip Pain Descriptors / Indicators: Aching;Sore Pain Intervention(s): Limited activity within patient's tolerance;Monitored during session;Premedicated before session;Ice applied    Home Living Family/patient expects to be discharged to:: Private residence Living Arrangements: Spouse/significant other Available Help at Discharge: Family Type of Home: House Home Access: Stairs to enter Entrance Stairs-Rails: Left Home Layout: Two level Home Equipment: Environmental consultantWalker - 2 wheels;Cane - single point      Prior Function Level of Independence: Independent          PT Goals (current goals can now be found in the care plan section) Acute Rehab PT Goals Patient Stated Goal: home tomorrow PT Goal Formulation: With patient Time For Goal Achievement: 09/12/17 Potential to Achieve Goals: Good Progress towards PT goals: Progressing toward goals    Frequency    7X/week      PT Plan Current plan remains appropriate    Co-evaluation              AM-PAC PT "6 Clicks" Daily Activity  Outcome Measure  Difficulty turning over in bed (including adjusting bedclothes, sheets and blankets)?: Unable Difficulty moving from lying on back to sitting on the side of  the bed? : Unable Difficulty sitting down on and standing up from a chair with arms (e.g., wheelchair, bedside commode, etc,.)?: Unable Help needed moving to and from a bed to chair (including a wheelchair)?: A Little Help needed walking in hospital room?: A Little Help needed climbing 3-5 steps with a railing? : A Little 6 Click Score: 12    End of Session Equipment Utilized During Treatment: Gait belt Activity Tolerance: Patient tolerated treatment well Patient left: in chair;with call bell/phone within reach;with chair alarm set;with nursing/sitter in room Nurse  Communication: Mobility status PT Visit Diagnosis: Unsteadiness on feet (R26.81);Difficulty in walking, not elsewhere classified (R26.2)     Time: 1610-9604 PT Time Calculation (min) (ACUTE ONLY): 23 min  Charges:  $Gait Training: 23-37 mins                    G Codes:       Pg [phone]    James Estrada 09/09/2017, 4:33 PM

## 2017-09-09 NOTE — Evaluation (Signed)
Occupational Therapy Evaluation and Discharge Patient Details Name: James Estrada MRN: [id number] DOB: 1941-03-10 Today's Date: 09/09/2017    History of Present Illness Pt s/p R THR   Clinical Impression   This 76 yo male admitted with above presents to acute OT with all education completed, we will D/C from acute OT.     Follow Up Recommendations  No OT follow up;Supervision - Intermittent    Equipment Recommendations  3 in 1 bedside commode       Precautions / Restrictions Precautions Precautions: Fall Restrictions Weight Bearing Restrictions: No Other Position/Activity Restrictions: WBAT      Mobility     General bed mobility comments: Pt up in recliner upon my arrival  Transfers Overall transfer level: Needs assistance Equipment used: Rolling walker (2 wheeled) Transfers: Sit to/from Stand Sit to Stand: Min guard         General transfer comment: pt able to state hand placement for transfers        ADL either performed or assessed with clinical judgement   ADL Overall ADL's : Needs assistance/impaired Eating/Feeding: Independent;Sitting   Grooming: Set up;Sitting   Upper Body Bathing: Set up;Sitting   Lower Body Bathing: Minimal assistance Lower Body Bathing Details (indicate cue type and reason): min guard A sit<>stand Upper Body Dressing : Set up;Sitting   Lower Body Dressing: Moderate assistance Lower Body Dressing Details (indicate cue type and reason): min guard A sit<>stand     Toileting- Clothing Manipulation and Hygiene: Min guard;Sit to/from stand   Tub/ Shower Transfer: Walk-in shower;Min guard;Ambulation;Rolling walker Tub/Shower Transfer Details (indicate cue type and reason): backing into shower stall with RW and sitting on 3n1   General ADL Comments: Pt has wife to A with LBADLs prn until he can do them by himself     Vision Patient Visual Report: No change from baseline              Pertinent Vitals/Pain Pain  Assessment: 0-10 Pain Score: 4  Pain Location: R hip Pain Descriptors / Indicators: Aching;Sore Pain Intervention(s): Limited activity within patient's tolerance;Monitored during session;Repositioned;Ice applied;Patient requesting pain meds-RN notified     Hand Dominance Right   Extremity/Trunk Assessment Upper Extremity Assessment Upper Extremity Assessment: Overall WFL for tasks assessed           Communication Communication Communication: No difficulties   Cognition Arousal/Alertness: Awake/alert Behavior During Therapy: WFL for tasks assessed/performed Overall Cognitive Status: Within Functional Limits for tasks assessed                                                Home Living Family/patient expects to be discharged to:: Private residence Living Arrangements: Spouse/significant other Available Help at Discharge: Family Type of Home: House Home Access: Stairs to enter Secretary/administrator of Steps: 14 Entrance Stairs-Rails: Left Home Layout: Two level Alternate Level Stairs-Number of Steps: 14 Alternate Level Stairs-Rails: Left Bathroom Shower/Tub: Walk-in shower;Door   [employer] Toilet: Standard     Home Equipment: Environmental consultant - 2 wheels;Cane - single point          Prior Functioning/Environment Level of Independence: Independent                 OT Problem List: Decreased range of motion;Impaired balance (sitting and/or standing);Pain      OT Treatment/Interventions:      OT Goals(Current goals can  be found in the care plan section) Acute Rehab OT Goals Patient Stated Goal: home tomorrow  OT Frequency:                AM-PAC PT "6 Clicks" Daily Activity     Outcome Measure Help from another person eating meals?: None Help from another person taking care of personal grooming?: A Little Help from another person toileting, which includes using toliet, bedpan, or urinal?: A Little Help from another person bathing (including  washing, rinsing, drying)?: A Little Help from another person to put on and taking off regular upper body clothing?: A Little Help from another person to put on and taking off regular lower body clothing?: A Lot 6 Click Score: 18   End of Session Equipment Utilized During Treatment: Gait belt;Rolling walker  Activity Tolerance: Patient tolerated treatment well Patient left: in chair;with call bell/phone within reach  OT Visit Diagnosis: Pain;Unsteadiness on feet (R26.81) Pain - Right/Left: Right Pain - part of body: Hip                Time: 1310-1336 OT Time Calculation (min): 26 min Charges:  OT General Charges $OT Visit: 1 Visit OT Evaluation $OT Eval Moderate Complexity: 1 Mod OT Treatments $Self Care/Home Management : 8-22 mins Ignacia PalmaCathy Santi Troung, OTR/L [phone] 09/09/2017

## 2017-09-09 NOTE — Progress Notes (Signed)
Subjective: 1 Day Post-Op Procedure(s) (LRB): RIGHT TOTAL HIP ARTHROPLASTY ANTERIOR APPROACH (Right) Patient reports pain as moderate.  Working well with therapy.  Post-op labs and vitals stable.  Objective: Vital signs in last 24 hours: Temp:  [97.3 F (36.3 C)-99.2 F (37.3 C)] 99.2 F (37.3 C) (10/27 1119) Pulse Rate:  [44-77] 53 (10/27 1119) Resp:  [11-22] 18 (10/27 1119) BP: (108-144)/(65-97) 128/87 (10/27 1119) SpO2:  [94 %-100 %] 94 % (10/27 1119) Weight:  [198 lb (89.8 kg)] 198 lb (89.8 kg) (10/26 1337)  Intake/Output from previous day: 10/26 0701 - 10/27 0700 In: 4338.8 [P.O.:660; I.V.:3678.8] Out: 6100 [Urine:5550; Blood:550] Intake/Output this shift: Total I/O In: 440 [P.O.:360; I.V.:80] Out: 350 [Urine:350]   Recent Labs  09/09/17 0543  HGB 13.3    Recent Labs  09/09/17 0543  WBC 15.7*  RBC 4.71  HCT 40.5  PLT 145*    Recent Labs  09/09/17 0543  NA 140  K 4.1  CL 105  CO2 26  BUN 13  CREATININE 0.99  GLUCOSE 139*  CALCIUM 8.9   No results for input(s): LABPT, INR in the last 72 hours.  Sensation intact distally Intact pulses distally Dorsiflexion/Plantar flexion intact Incision: dressing C/D/I  Assessment/Plan: 1 Day Post-Op Procedure(s) (LRB): RIGHT TOTAL HIP ARTHROPLASTY ANTERIOR APPROACH (Right) Up with therapy Plan for discharge tomorrow Discharge home with home health  Kathryne HitchChristopher Y Najee Cowens 09/09/2017, 11:36 AM

## 2017-09-09 NOTE — Care Management Note (Addendum)
Case Management Note  Patient Details  Name: Elane FritzGeorge E Umble MRN: [id number] Date of Birth: Mar 17, 1941  Subjective/Objective:    S/p R THR                Action/Plan: Discharge Planning: NCM spoke to pt and wife, Kathie RhodesBetty at bedside. Offered choice for HH/list provided. Pt has RW at home Requesting 3n1 bedside commode. Contacted [facility] to deliver to room prior to dc.  Pt agreeable to [facility] for HHPT.   PCP Renford DillsPolite, Ronald MD   Expected Discharge Date:                Expected Discharge Plan:  Home w Home Health Services  In-House Referral:  NA  Discharge planning Services  CM Consult  Post Acute Care Choice:  Home Health Choice offered to:  Patient, Spouse  DME Arranged:  3-N-1 DME Agency:  [facility]  HH Arranged:  PT HH Agency[facility]  [facility]  Status of Service:  Completed, signed off  If discussed at Long Length of Stay Meetings, dates discussed:    Additional Comments:  Elliot CousinShavis, Traye Bates Ellen, RN 09/09/2017, 10:26 AM

## 2017-09-09 NOTE — Progress Notes (Signed)
Physical Therapy Treatment Patient Details Name: James Estrada MRN: [id number] DOB: 06/01/1941 Today's Date: 09/09/2017    History of Present Illness Pt s/p R THR    PT Comments    Pt very motivated and progressing well with mobility.  Pt hopeful for dc home tomorrow.   Follow Up Recommendations  Home health PT     Equipment Recommendations  None recommended by PT    Recommendations for Other Services OT consult     Precautions / Restrictions Precautions Precautions: Fall Restrictions Weight Bearing Restrictions: No Other Position/Activity Restrictions: WBAT    Mobility  Bed Mobility Overal bed mobility: Needs Assistance Bed Mobility: Supine to Sit     Supine to sit: Min assist     General bed mobility comments: cues for sequence and use of L LE to self assist  Transfers Overall transfer level: Needs assistance Equipment used: Rolling walker (2 wheeled) Transfers: Sit to/from Stand Sit to Stand: Min assist         General transfer comment: cues for LE management and use of UEs to self assist  Ambulation/Gait Ambulation/Gait assistance: Min assist Ambulation Distance (Feet): 120 Feet Assistive device: Rolling walker (2 wheeled) Gait Pattern/deviations: Step-to pattern;Decreased step length - right;Decreased step length - left;Shuffle;Trunk flexed Gait velocity: dec Gait velocity interpretation: Below normal speed for age/gender General Gait Details: cues for posture, position from RW and initial sequence   Stairs            Wheelchair Mobility    Modified Rankin (Stroke Patients Only)       Balance                                            Cognition Arousal/Alertness: Awake/alert Behavior During Therapy: WFL for tasks assessed/performed Overall Cognitive Status: Within Functional Limits for tasks assessed                                        Exercises Total Joint Exercises Ankle  Circles/Pumps: AROM;Both;15 reps;Supine Quad Sets: AROM;Both;10 reps;Supine Heel Slides: AAROM;Right;20 reps;Supine Hip ABduction/ADduction: AAROM;Right;15 reps;Supine    General Comments        Pertinent Vitals/Pain Pain Assessment: 0-10 Pain Score: 3  Pain Location: R hip Pain Descriptors / Indicators: Aching;Sore Pain Intervention(s): Limited activity within patient's tolerance;Monitored during session;Premedicated before session;Ice applied    Home Living                      Prior Function            PT Goals (current goals can now be found in the care plan section) Acute Rehab PT Goals Patient Stated Goal: Regain IND PT Goal Formulation: With patient Time For Goal Achievement: 09/12/17 Potential to Achieve Goals: Good Progress towards PT goals: Progressing toward goals    Frequency    7X/week      PT Plan Current plan remains appropriate    Co-evaluation              AM-PAC PT "6 Clicks" Daily Activity  Outcome Measure  Difficulty turning over in bed (including adjusting bedclothes, sheets and blankets)?: Unable Difficulty moving from lying on back to sitting on the side of the bed? : Unable Difficulty sitting down on and standing up from  a chair with arms (e.g., wheelchair, bedside commode, etc,.)?: Unable Help needed moving to and from a bed to chair (including a wheelchair)?: A Little Help needed walking in hospital room?: A Little Help needed climbing 3-5 steps with a railing? : A Lot 6 Click Score: 11    End of Session Equipment Utilized During Treatment: Gait belt Activity Tolerance: Patient tolerated treatment well Patient left: in chair;with call bell/phone within reach;with chair alarm set;with nursing/sitter in room Nurse Communication: Mobility status PT Visit Diagnosis: Unsteadiness on feet (R26.81);Difficulty in walking, not elsewhere classified (R26.2)     Time: [id number] PT Time Calculation (min) (ACUTE ONLY): 37  min  Charges:  $Gait Training: 8-22 mins $Therapeutic Exercise: 8-22 mins                    G Codes:       Pg [phone]    Rhodie Cienfuegos 09/09/2017, 12:57 PM

## 2017-09-10 NOTE — Progress Notes (Signed)
Subjective: 2 Days Post-Op Procedure(s) (LRB): RIGHT TOTAL HIP ARTHROPLASTY ANTERIOR APPROACH (Right) Patient reports pain as mild.  Has cleared therapy.  Objective: Vital signs in last 24 hours: Temp:  [97.9 F (36.6 C)-99.2 F (37.3 C)] 98.5 F (36.9 C) (10/28 09810632) Pulse Rate:  [50-68] 50 (10/28 0632) Resp:  [15-18] 15 (10/28 19140632) BP: (114-128)/(68-87) 114/68 (10/28 78290632) SpO2:  [94 %-99 %] 99 % (10/28 56210632)  Intake/Output from previous day: 10/27 0701 - 10/28 0700 In: 1480 [P.O.:1320; I.V.:160] Out: 2050 [Urine:2050] Intake/Output this shift: Total I/O In: 240 [P.O.:240] Out: 200 [Urine:200]   Recent Labs  09/09/17 0543  HGB 13.3    Recent Labs  09/09/17 0543  WBC 15.7*  RBC 4.71  HCT 40.5  PLT 145*    Recent Labs  09/09/17 0543  NA 140  K 4.1  CL 105  CO2 26  BUN 13  CREATININE 0.99  GLUCOSE 139*  CALCIUM 8.9   No results for input(s): LABPT, INR in the last 72 hours.  Sensation intact distally Intact pulses distally Dorsiflexion/Plantar flexion intact Incision: dressing C/D/I  Assessment/Plan: 2 Days Post-Op Procedure(s) (LRB): RIGHT TOTAL HIP ARTHROPLASTY ANTERIOR APPROACH (Right) Up with therapy Discharge home with home health today.  Kathryne HitchChristopher Y Nochum Fenter 09/10/2017, 9:25 AM

## 2017-09-10 NOTE — Progress Notes (Signed)
Physical Therapy Treatment Patient Details Name: James Estrada MRN: [id number] DOB: [birth year] Today's Date: 09/10/2017    History of Present Illness Pt s/p R THR    PT Comments    Pt progressing well and eager for return home.  Reviewed therex, stairs and car transfers.  Written instruction provided.   Follow Up Recommendations  Home health PT     Equipment Recommendations  None recommended by PT    Recommendations for Other Services OT consult     Precautions / Restrictions Precautions Precautions: Fall Restrictions Weight Bearing Restrictions: No Other Position/Activity Restrictions: WBAT    Mobility  Bed Mobility Overal bed mobility: Needs Assistance Bed Mobility: Supine to Sit     Supine to sit: Min guard     General bed mobility comments: min cues for use of L LE to self assist  Transfers Overall transfer level: Needs assistance Equipment used: Rolling walker (2 wheeled) Transfers: Sit to/from Stand Sit to Stand: Supervision         General transfer comment: Pt self cueing  Ambulation/Gait Ambulation/Gait assistance: Min guard;Supervision Ambulation Distance (Feet): 300 Feet Assistive device: Rolling walker (2 wheeled) Gait Pattern/deviations: Decreased step length - right;Decreased step length - left;Shuffle;Trunk flexed;Step-to pattern;Step-through pattern Gait velocity: dec Gait velocity interpretation: Below normal speed for age/gender General Gait Details: min cues for posture, position from RW and initial sequence   Stairs Stairs: Yes   Stair Management: One rail Left;Step to pattern;Forwards;With cane Number of Stairs: 4 General stair comments: cues for sequence and foot/RW placement  Wheelchair Mobility    Modified Rankin (Stroke Patients Only)       Balance                                            Cognition Arousal/Alertness: Awake/alert Behavior During Therapy: WFL for tasks  assessed/performed Overall Cognitive Status: Within Functional Limits for tasks assessed                                        Exercises Total Joint Exercises Ankle Circles/Pumps: AROM;Both;15 reps;Supine Quad Sets: AROM;Both;10 reps;Supine Heel Slides: AAROM;Right;20 reps;Supine Hip ABduction/ADduction: AAROM;Right;15 reps;Supine Long Arc Quad: Right;AAROM;10 reps;Seated    General Comments        Pertinent Vitals/Pain Pain Assessment: 0-10 Pain Score: 4  Pain Location: R hip Pain Descriptors / Indicators: Aching;Sore Pain Intervention(s): Limited activity within patient's tolerance;Monitored during session;Premedicated before session;Ice applied    Home Living                      Prior Function            PT Goals (current goals can now be found in the care plan section) Acute Rehab PT Goals Patient Stated Goal: Golf PT Goal Formulation: With patient Time For Goal Achievement: 09/12/17 Potential to Achieve Goals: Good Progress towards PT goals: Progressing toward goals    Frequency    7X/week      PT Plan Current plan remains appropriate    Co-evaluation              AM-PAC PT "6 Clicks" Daily Activity  Outcome Measure  Difficulty turning over in bed (including adjusting bedclothes, sheets and blankets)?: A Lot Difficulty moving from lying on back to sitting  on the side of the bed? : A Lot Difficulty sitting down on and standing up from a chair with arms (e.g., wheelchair, bedside commode, etc,.)?: A Lot Help needed moving to and from a bed to chair (including a wheelchair)?: A Little Help needed walking in hospital room?: A Little Help needed climbing 3-5 steps with a railing? : A Little 6 Click Score: 15    End of Session Equipment Utilized During Treatment: Gait belt Activity Tolerance: Patient tolerated treatment well Patient left: in chair;with call bell/phone within reach;with chair alarm set;with nursing/sitter  in room Nurse Communication: Mobility status PT Visit Diagnosis: Unsteadiness on feet (R26.81);Difficulty in walking, not elsewhere classified (R26.2)     Time: 0814-0900 PT Time Calculation (min) (ACUTE ONLY): 46 min  Charges:  $Gait Training: 8-22 mins $Therapeutic Exercise: 8-22 mins $Therapeutic Activity: 8-22 mins                    G Codes:       Pg [phone]    James Estrada 09/10/2017, 12:32 PM

## 2017-09-10 NOTE — Discharge Summary (Signed)
Patient ID: James Estrada MRN: [id number] DOB/AGE: Nov 13, 1941 76 y.o.  Admit date: 09/08/2017 Discharge date: 09/10/2017  Admission Diagnoses:  Principal Problem:   Unilateral primary osteoarthritis, right hip Active Problems:   Status post total replacement of right hip   Discharge Diagnoses:  Same  Past Medical History:  Diagnosis Date  . AAA (abdominal aortic aneurysm) (HCC)    last u/s done 07/18/17   . Arthritis   . Bradycardia   . Dysrhythmia    frequent PAC, for 20 years  . GERD (gastroesophageal reflux disease)    occ, OTC  . Hemorrhoids   . Hyperlipidemia   . Hypertension   . Seasonal allergies     Surgeries: Procedure(s): RIGHT TOTAL HIP ARTHROPLASTY ANTERIOR APPROACH on 09/08/2017   Consultants:   Discharged Condition: Improved  Hospital Course: James Estrada is an 76 y.o. male who was admitted 09/08/2017 for operative treatment ofUnilateral primary osteoarthritis, right hip. Patient has severe unremitting pain that affects sleep, daily activities, and work/hobbies. After pre-op clearance the patient was taken to the operating room on 09/08/2017 and underwent  Procedure(s): RIGHT TOTAL HIP ARTHROPLASTY ANTERIOR APPROACH.    Patient was given perioperative antibiotics: Anti-infectives    Start     Dose/Rate Route Frequency Ordered Stop   09/08/17 1430  vancomycin (VANCOCIN) IVPB 1000 mg/200 mL premix     1,000 mg 200 mL/hr over 60 Minutes Intravenous Every 12 hours 09/08/17 1344 09/08/17 1554   09/08/17 0847  vancomycin (VANCOCIN) 1-5 GM/200ML-% IVPB    Comments:  Curlene Dolphin   : cabinet override      09/08/17 0847 09/08/17 1015   09/08/17 0847  ceFAZolin (ANCEF) 2-4 GM/100ML-% IVPB  Status:  Discontinued    Comments:  Curlene Dolphin   : cabinet override      09/08/17 0847 09/08/17 1043   09/08/17 0845  vancomycin (VANCOCIN) IVPB 1000 mg/200 mL premix  Status:  Discontinued     1,000 mg 200 mL/hr over 60 Minutes Intravenous  Once 09/08/17  0844 09/08/17 0857   09/08/17 0844  ceFAZolin (ANCEF) IVPB 2g/100 mL premix  Status:  Discontinued     2 g 200 mL/hr over 30 Minutes Intravenous On call to O.R. 09/08/17 7829 09/08/17 0857   09/08/17 0844  vancomycin (VANCOCIN) IVPB 1000 mg/200 mL premix  Status:  Discontinued     1,000 mg 200 mL/hr over 60 Minutes Intravenous  Once 09/08/17 0844 09/08/17 1412       Patient was given sequential compression devices, early ambulation, and chemoprophylaxis to prevent DVT.  Patient benefited maximally from hospital stay and there were no complications.    Recent vital signs: Patient Vitals for the past 24 hrs:  BP Temp Temp src Pulse Resp SpO2  09/10/17 0632 114/68 98.5 F (36.9 C) Oral (!) 50 15 99 %  09/09/17 2139 121/70 98.2 F (36.8 C) Oral 68 17 98 %  09/09/17 1725 - 99.1 F (37.3 C) Oral - - -  09/09/17 1349 119/72 97.9 F (36.6 C) Axillary 65 16 95 %  09/09/17 1119 128/87 99.2 F (37.3 C) Oral (!) 53 18 94 %     Recent laboratory studies:  Recent Labs  09/09/17 0543  WBC 15.7*  HGB 13.3  HCT 40.5  PLT 145*  NA 140  K 4.1  CL 105  CO2 26  BUN 13  CREATININE 0.99  GLUCOSE 139*  CALCIUM 8.9     Discharge Medications:   Allergies as of 09/10/2017  No Known Allergies     Medication List    STOP taking these medications   aspirin 81 MG tablet Replaced by:  aspirin 81 MG chewable tablet     TAKE these medications   aspirin 81 MG chewable tablet Chew 1 tablet (81 mg total) by mouth 2 (two) times daily. Replaces:  aspirin 81 MG tablet   atenolol 25 MG tablet Commonly known as:  TENORMIN Take 12.5 mg by mouth daily.   fluocinonide cream 0.05 % Commonly known as:  LIDEX Apply 1 application topically 2 (two) times daily as needed (for ecsema).   ibuprofen 200 MG tablet Commonly known as:  ADVIL,MOTRIN Take 200-400 mg by mouth every 6 (six) hours as needed for headache or moderate pain.   oxyCODONE-acetaminophen 5-325 MG tablet Commonly known as:   ROXICET Take 1-2 tablets by mouth every 4 (four) hours as needed.   triamcinolone cream 0.1 % Commonly known as:  KENALOG Apply 1 application topically 2 (two) times daily as needed (for ecsema).            Durable Medical Equipment        Start     Ordered   09/08/17 1345  DME 3 n 1  Once     09/08/17 1344   09/08/17 1345  DME Walker rolling  Once    Question:  Patient needs a walker to treat with the following condition  Answer:  Status post total replacement of right hip   09/08/17 1344      Diagnostic Studies: Dg Pelvis Portable  Result Date: 09/08/2017 CLINICAL DATA:  Status post hip replacement EXAM: PORTABLE PELVIS 1-2 VIEWS COMPARISON:  Fluoroscopy from earlier today FINDINGS: An AP view of the right hip shows total joint arthroplasty that is intact and located. Negative left hip.  Right inguinal hernia repair with mesh. IMPRESSION: No complicating feature after total right hip arthroplasty. Electronically Signed   By: Marnee SpringJonathon  Watts M.D.   On: 09/08/2017 12:48   Dg C-arm 1-60 Min-no Report  Result Date: 09/08/2017 Fluoroscopy was utilized by the requesting physician.  No radiographic interpretation.   Dg Hip Operative Unilat W Or W/o Pelvis Right  Result Date: 09/08/2017 CLINICAL DATA:  Status post total hip replacement EXAM: OPERATIVE RIGHT HIP  1 VIEWS TECHNIQUE: Fluoroscopic spot image(s) were submitted for interpretation post-operatively. COMPARISON:  None. FLUOROSCOPY TIME:  0 minutes 37 seconds; 4.11 mGy ; 2 acquired images FINDINGS: Frontal view obtained. There is a total hip replacement on the right with prosthetic components appearing well-seated on frontal view. No fracture or dislocation. Portions of left hip joint which are seen appear normal. IMPRESSION: Total hip replacement on the right with prosthetic components well-seated. No fracture or dislocation evident on frontal view. Electronically Signed   By: Bretta BangWilliam  Woodruff III M.D.   On: 09/08/2017 12:09     Disposition: 01-Home or Self Care  Discharge Instructions    Discharge patient    Complete by:  As directed    Discharge disposition:  01-Home or Self Care   Discharge patient date:  09/10/2017      Follow-up Information    [facility] Follow up.   Specialty:  Home Health Services Why:  Home Health Physical Therapy-will call to arrange iniital visit Contact information: [address] [place] [address] [place] [address] [phone]        Kathryne HitchBlackman, Christopher Y, MD Follow up in 2 week(s).   Specialty:  Orthopedic Surgery Contact information: [address] [place]  [address] [place] [address] [phone]            Signed: Kathryne Hitch 09/10/2017, 9:27 AM

## 2017-09-11 DIAGNOSIS — I10 Essential (primary) hypertension: Secondary | ICD-10-CM | POA: Diagnosis not present

## 2017-09-11 DIAGNOSIS — Z96641 Presence of right artificial hip joint: Secondary | ICD-10-CM | POA: Diagnosis not present

## 2017-09-11 DIAGNOSIS — I499 Cardiac arrhythmia, unspecified: Secondary | ICD-10-CM | POA: Diagnosis not present

## 2017-09-11 DIAGNOSIS — Z471 Aftercare following joint replacement surgery: Secondary | ICD-10-CM | POA: Diagnosis not present

## 2017-09-11 NOTE — Op Note (Signed)
NAME:  James Estrada, James Estrada NO.:  MEDICAL RECORD NO.:  [id number]  LOCATION:                                 FACILITY:  PHYSICIAN:  Vanita Panda. Magnus Ivan, M.D.DATE OF BIRTH:  DATE OF PROCEDURE:  09/08/2017 DATE OF DISCHARGE:                              OPERATIVE REPORT   PREOPERATIVE DIAGNOSIS:  Primary osteoarthritis and degenerative joint disease, right hip.  POSTOPERATIVE DIAGNOSIS:  Primary osteoarthritis and degenerative joint disease, right hip.  PROCEDURE:  Right total hip arthroplasty through direct anterior approach.  IMPLANTS:  DePuy Sector Gription acetabular component size 54, size 36 +0 neutral polyethylene liner, size 11 Corail femoral component with standard offset, size 36 +1.5 ceramic hip ball.  SURGEON:  Vanita Panda. Magnus Ivan, M.D.  ASSISTANT:  Richardean Canal, PA-C.  ANESTHESIA:  Spinal.  ANTIBIOTICS:  1 g of IV vancomycin.  BLOOD LOSS:  550 mL.  COMPLICATIONS:  None.  INDICATIONS:  James Estrada is a 76 year old gentleman with debilitating arthritis involving his right hip.  His pain is daily and it is detrimentally affected his activities of daily living, his quality of life and his mobility.  At this point, he wants to proceed with a total hip arthroplasty.  His x-rays showed complete loss of superolateral joint space of his right hip.  He has tried and failed all forms of conservative treatment.  He understands the risk of acute blood loss anemia, nerve and vessel injury, fracture, infection, dislocation, DVT. He understands the goals are to decrease pain, improve mobility and overall improved quality of life.  PROCEDURE DESCRIPTION:  After informed consent was obtained, appropriate right hip was marked.  He was brought to the operating room and placed supine on the operating table.  Spinal anesthesia was obtained with him on the stretcher.  A Foley catheter was placed and both feet had traction boots applied to them.   Then, he was placed supine on the Hana fracture table with the perineal post in place and both legs in inline skeletal traction devices, but no traction applied.  His right operative hip was prepped and draped with DuraPrep and sterile drapes.  A time-out was called and he was identified as correct patient and correct right hip.  We then made an incision just inferior and posterior to the anterior superior iliac spine and carried this obliquely down the leg. We dissected down the tensor fascia lata muscle.  The tensor fascia was then divided longitudinally to proceed with a direct anterior approach to the hip.  We identified and cauterized the circumflex vessels and then identified the hip capsule.  I opened up the hip capsule in an L- type format, finding a large joint effusion and significant arthritis in his hip.  We then made our femoral neck cut with an oscillating saw just proximal to the lesser trochanter and completed this with an osteotome, finding large area of cartilage over the anterior, lateral superior head.  We then cleaned the acetabulum, remnants of the acetabular labrum and other debris.  We placed a bent Hohmann over the medial acetabular rim and removed remnants of the acetabular labrum.  We then began reaming from a size  43 reamer, going in several millimeters increments up to a size 54 with all reamers under direct visualization and the last reamer under direct fluoroscopy, so I could obtain my depth of reaming, my inclination and anteversion.  I then placed the real DePuy Sector Gription acetabular component size 54 and a 36 +0 polyethylene liner for that size acetabular component.  Attention was then turned to the femur. With the leg externally rotated to 120 degrees, extended and adducted, we were able to place a Mueller retractor medially and a Hohmann retractor behind the greater trochanter.  We released the lateral joint capsule and used a box cutting osteotome  to enter the femoral canal and a rongeur to lateralize.  We then began broaching from size 8 broach using the Corail broaching system going up to a size 11.  We then placed a standard offset femoral neck and a 36 +1.5 ceramic hip ball.  We brought the leg back over and up with traction and internal rotation reducing the pelvis, and we were pleased with stability.  Of note, his preoperative leg lengths on plain film and his intraoperative leg lengths on C-arm showed that he is preoperatively longer on the right than the left; however, clinically, he is actually down on leg length with equal leg lengths.  We placed markers knowing that we needed to match our films.  I felt that we matched our films.  He had good range of motion and offset and stability.  We then dislocated the hip and removed the trial components.  We were able to place the real Corail femoral component size 11 with standard offset and the real 36+ 0 ceramic hip ball, reduced this in the acetabulum and again, we were pleased with leg length, offset, range of motion and stability and all this was based off his preoperative clinical exam and his pre and post intraoperative x-rays.  We then irrigated the soft tissue with normal saline solution using pulsatile lavage.  We closed the joint capsule with interrupted #1 Ethibond suture followed by running #1 Vicryl in the tensor fascia, 0 Vicryl in the deep tissue, 2-0 Vicryl in the subcutaneous tissue, 4-0 Monocryl subcuticular stitch and Steri-Strips on the skin.  An Aquacel dressing was then applied.  He was taken off the Hana table and taken to the recovery room in stable condition.  All final counts were correct.  There were no complications noted.  Of note, Richardean CanalGilbert Clark, PA-C assisted in the entire case.  His assistance was crucial for facilitating all aspects of this case.     Vanita Pandahristopher Y. Magnus IvanBlackman, M.D.     CYB/MEDQ  D:  09/08/2017  T:  09/09/2017  Job:  [id number]

## 2017-09-14 ENCOUNTER — Telehealth (INDEPENDENT_AMBULATORY_CARE_PROVIDER_SITE_OTHER): Payer: Self-pay | Admitting: Orthopaedic Surgery

## 2017-09-14 NOTE — Telephone Encounter (Signed)
Can you please tell him that it is ok to just take Advil please

## 2017-09-14 NOTE — Telephone Encounter (Signed)
Patient called advised he would like to stop taking the Oxycodone and start taking Advil. The number to contact patient is [phone]

## 2017-09-21 ENCOUNTER — Ambulatory Visit (INDEPENDENT_AMBULATORY_CARE_PROVIDER_SITE_OTHER): Payer: Medicare Other | Admitting: Orthopaedic Surgery

## 2017-09-21 ENCOUNTER — Encounter (INDEPENDENT_AMBULATORY_CARE_PROVIDER_SITE_OTHER): Payer: Self-pay | Admitting: Orthopaedic Surgery

## 2017-09-21 DIAGNOSIS — Z96641 Presence of right artificial hip joint: Secondary | ICD-10-CM

## 2017-09-21 NOTE — Progress Notes (Signed)
The patient is now 2 weeks status post a right total hip arthroplasty.  He says he is doing well.  He embolus with a cane but does not use at home.  He has not driven yet.  He is anxious to get back to the gym.  He is 76 years old.  He is been on aspirin twice a day and takes insulin once a day before surgery.  He understands and go back to once a day as well.  He is Artie finished home health therapy and feels like he is making good progress.  On exam he tolerates knees with right operative hip through range of motion.  His leg lengths are equal.  His incision is well-healed.  Her medial sutures in [employer] [place].  There is no significant seroma.  At this point we will continue to increase his activities as comfort allows.  Again I will take an aspirin once a day.  He is cleared to drive when he is comfortable.  I will see him back in 4 weeks to see how he is doing overall but no x-rays are needed.

## 2017-10-19 ENCOUNTER — Encounter (INDEPENDENT_AMBULATORY_CARE_PROVIDER_SITE_OTHER): Payer: Self-pay | Admitting: Physician Assistant

## 2017-10-19 ENCOUNTER — Ambulatory Visit (INDEPENDENT_AMBULATORY_CARE_PROVIDER_SITE_OTHER): Payer: Medicare Other | Admitting: Physician Assistant

## 2017-10-19 DIAGNOSIS — Z96641 Presence of right artificial hip joint: Secondary | ICD-10-CM

## 2017-10-19 NOTE — Progress Notes (Signed)
Mr. Clydene FakeHannaum returns today status post right total hip arthroplasty 09/08/2017.  States overall the hip is doing well.  His range of motion and strength are improving daily.  Does have some stiffness with the first step this goes away quickly.  He has had no shortness of breath no fevers chills chest pain.  Physical exam: General well-developed well-nourished male no acute distress mood and affect appropriate Right hip: Good range of motion without pain.  Ambulates without any assistive device.  Calf supple nontender.  Dorsiflexion plantarflexion ankle intact.  Impression: Status post right total hip arthroplasty  Plan: He will continue to work on range of motion strengthening.  Reassurance is given stiffness he feels when he first begins to ambulate should dissipate around 3 months.  He will follow-up with us at 1 year postop at that time we will obtain an AP pelvis and a lateral view of the right hip.  He will return sooner if there is any questions or concerns.

## 2017-10-20 DIAGNOSIS — H524 Presbyopia: Secondary | ICD-10-CM | POA: Diagnosis not present

## 2018-04-27 IMAGING — US US AORTA
1 series · 7 of 7 positions shown · non-contrast
Comparison: 08/03/2015 lumbar spine MR.

CLINICAL DATA: 75-year-old male with abdominal aortic aneurysm.
Subsequent encounter.

EXAM:
ULTRASOUND OF ABDOMINAL AORTA
TECHNIQUE: Ultrasound examination of the abdominal aorta was performed to
evaluate for abdominal aortic aneurysm.

[Series 1: us aorta · 0.26mm/px · 7 of 7 slices shown]
[im 1/7]
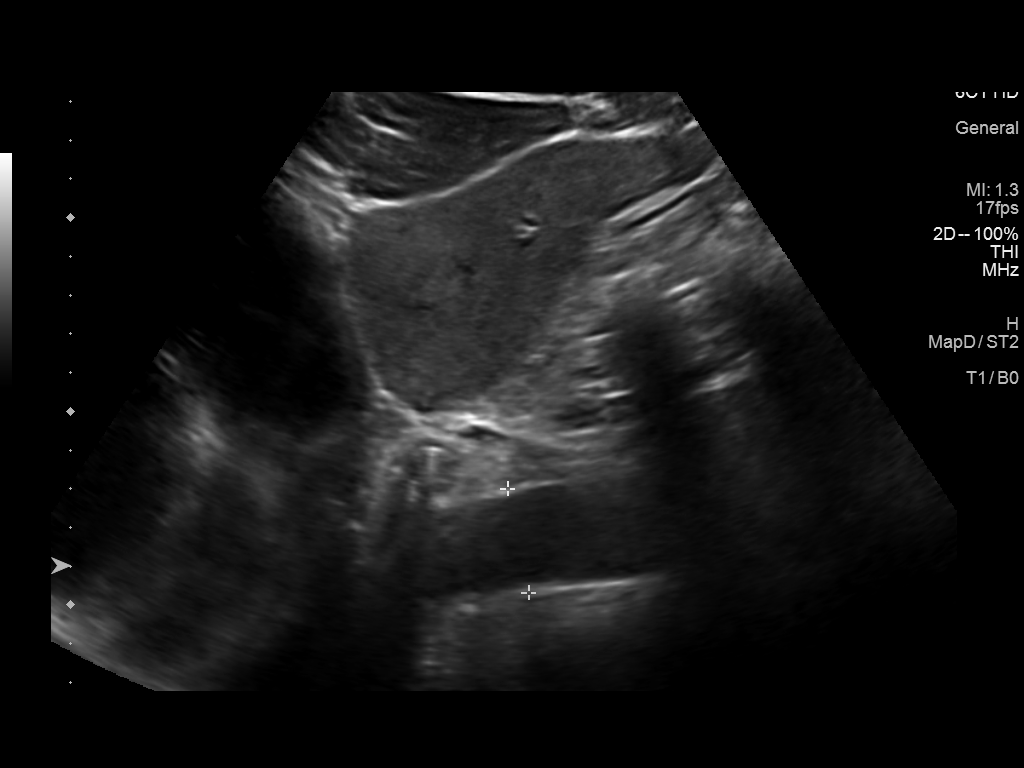
[im 2/7]
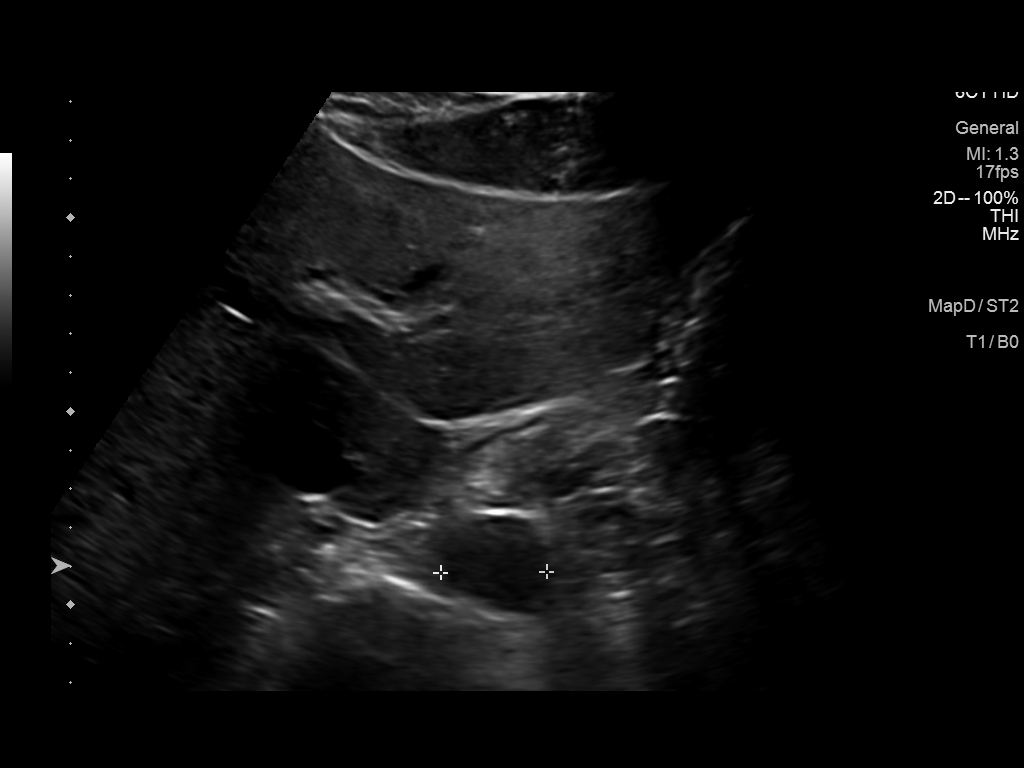
[im 3/7]
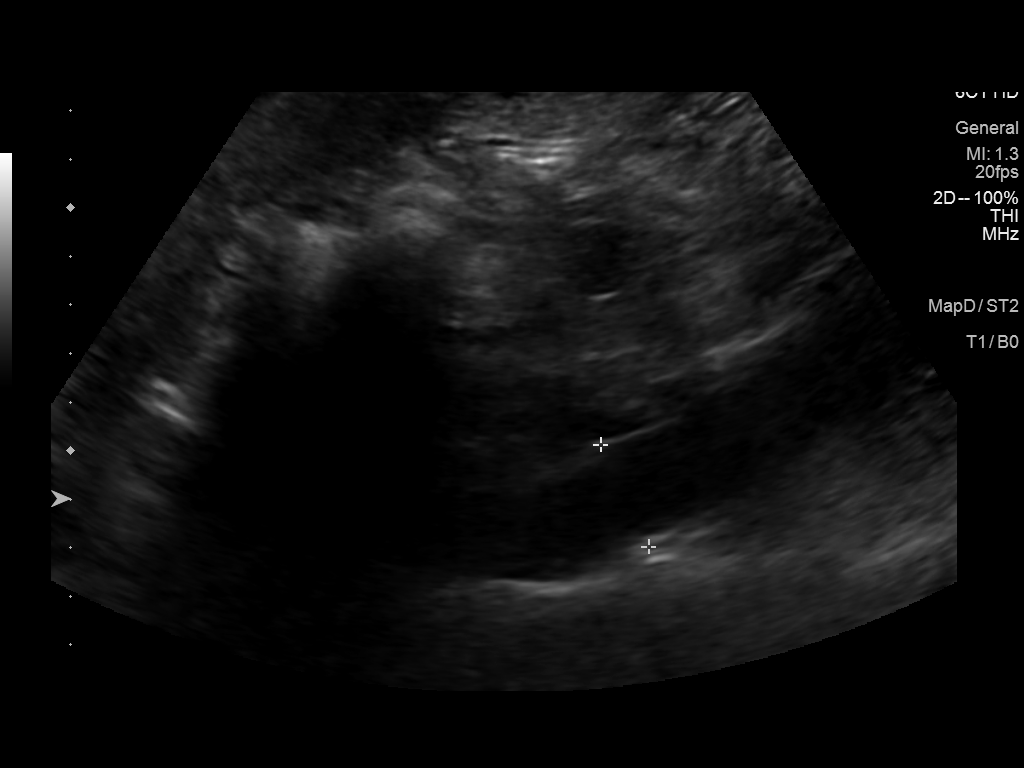
[im 4/7]
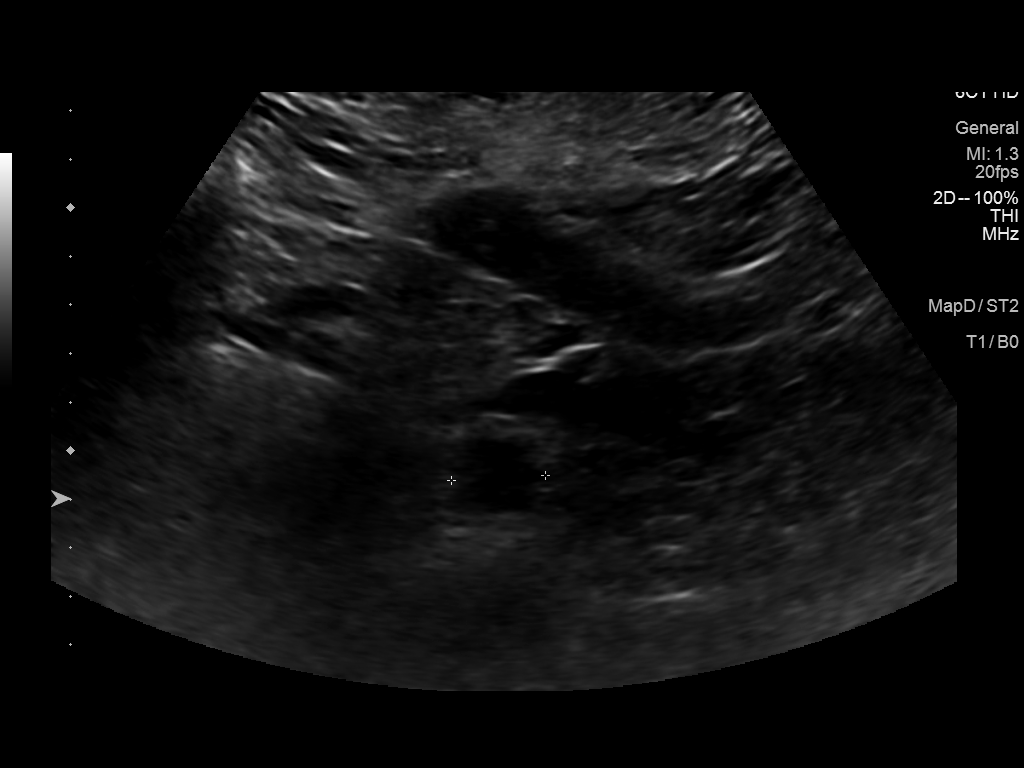
[im 5/7]
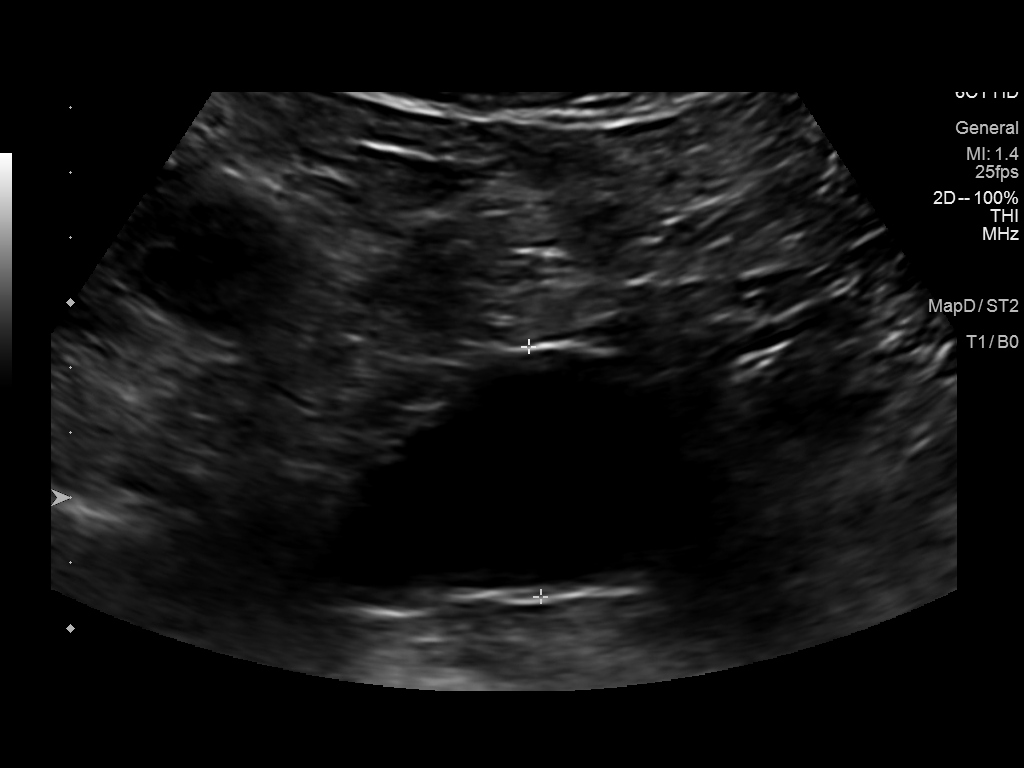
[im 6/7]
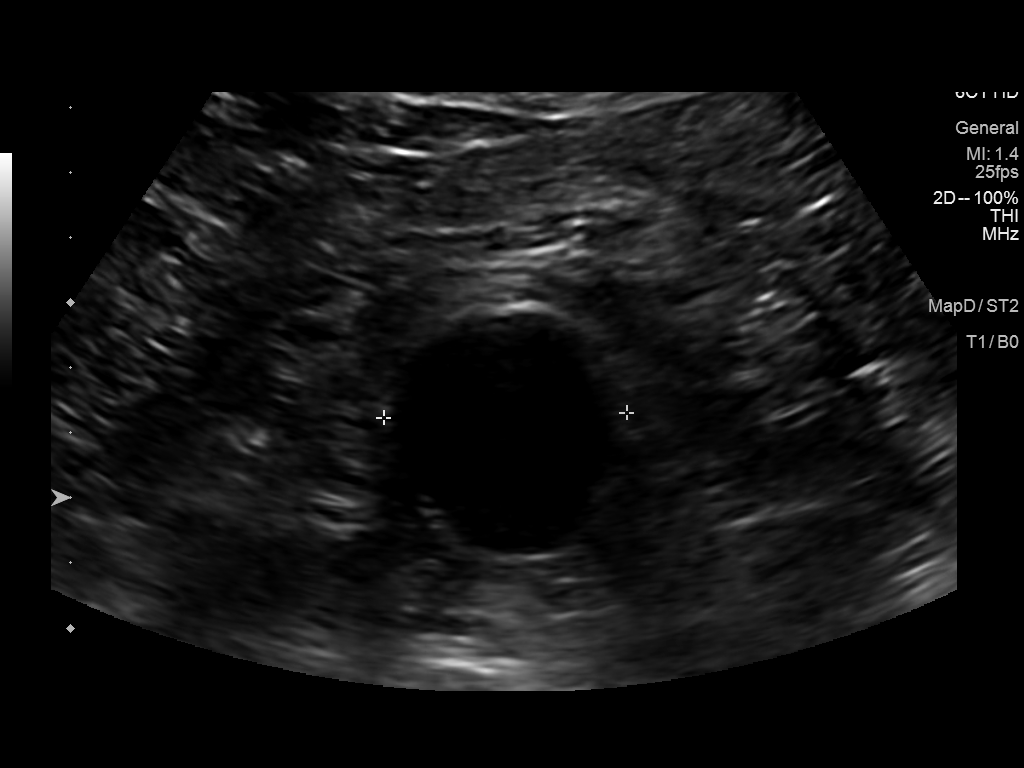
[im 7/7]
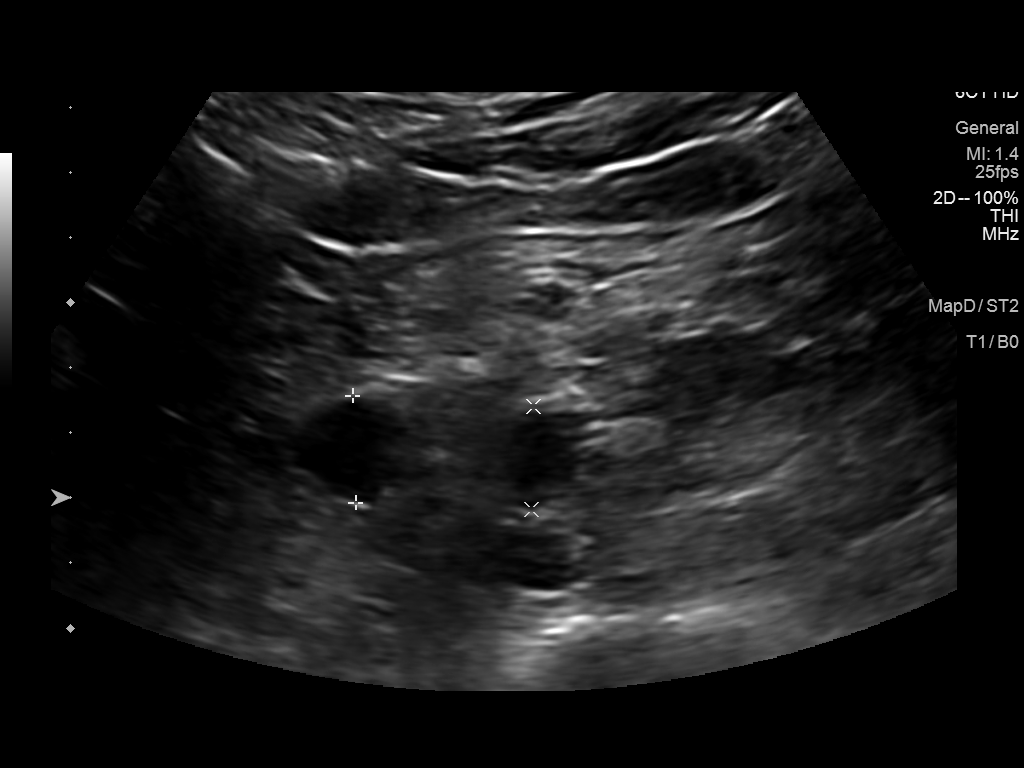

[7 of 7 positions shown; findings below may reference images not displayed]

FINDINGS: Abdominal aortic measurements as follows:

Proximal:  2.7 x 2.7 cm

Mid:  2.3 x 1.9 cm

Distal:  3.8 x 3.7 cm
IMPRESSION: Distal abdominal aortic aneurysm has increased in size currently
measuring 3.8 x 3.7 cm versus 3.4 x 3.6 cm on 08/03/2015 MR.
Recommend followup by ultrasound in 2 years. This recommendation
follows ACR consensus guidelines: White Paper of the ACR Incidental

## 2018-04-29 IMAGING — CR DG HIP (WITH OR WITHOUT PELVIS) 2-3V*R*
2 series · 2 of 2 positions shown · non-contrast
Comparison: None.

CLINICAL DATA: Right hip pain for 2 years radiating to the right
groin

EXAM:
DG HIP (WITH OR WITHOUT PELVIS) 2-3V RIGHT

[w pelvis]
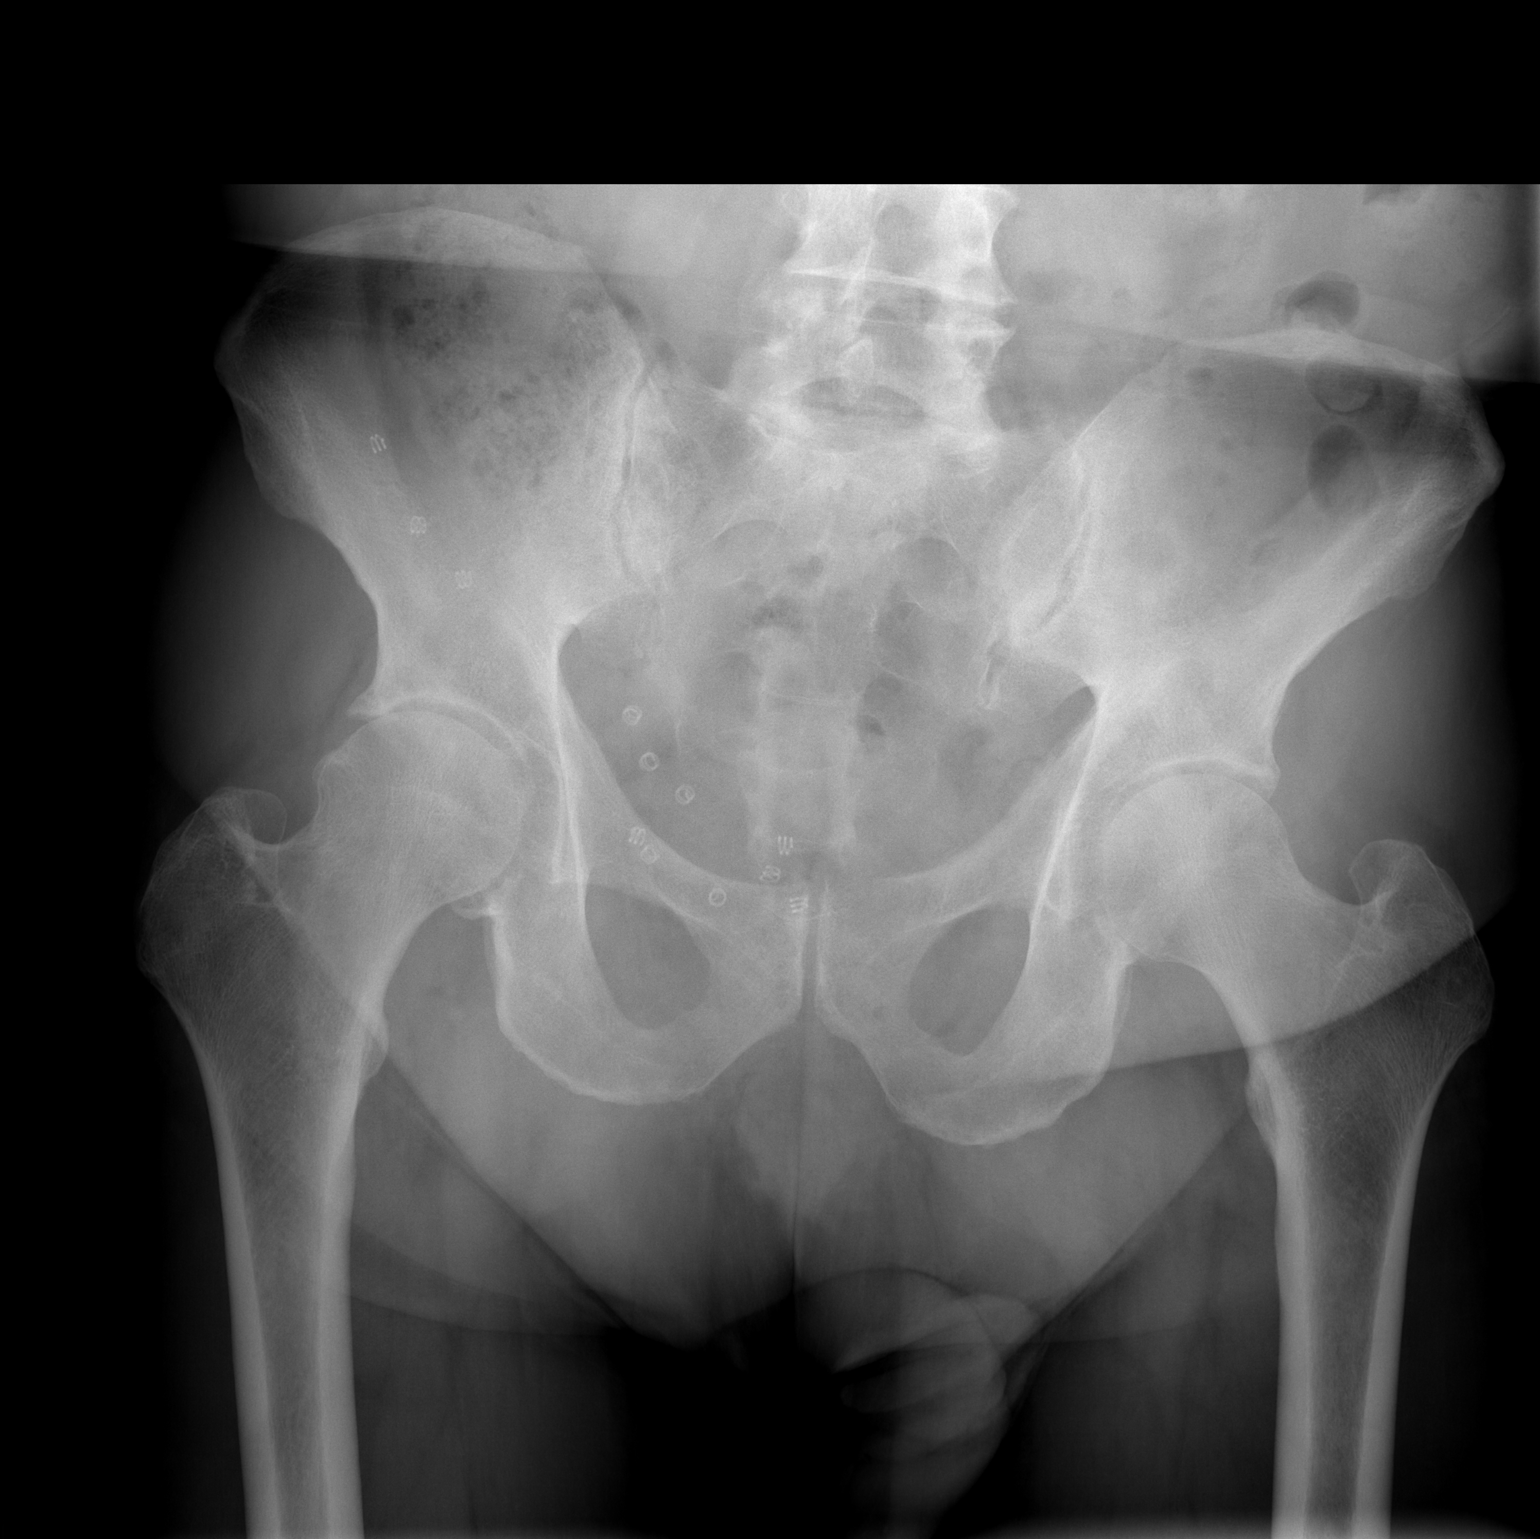

[t hip frog leg right]
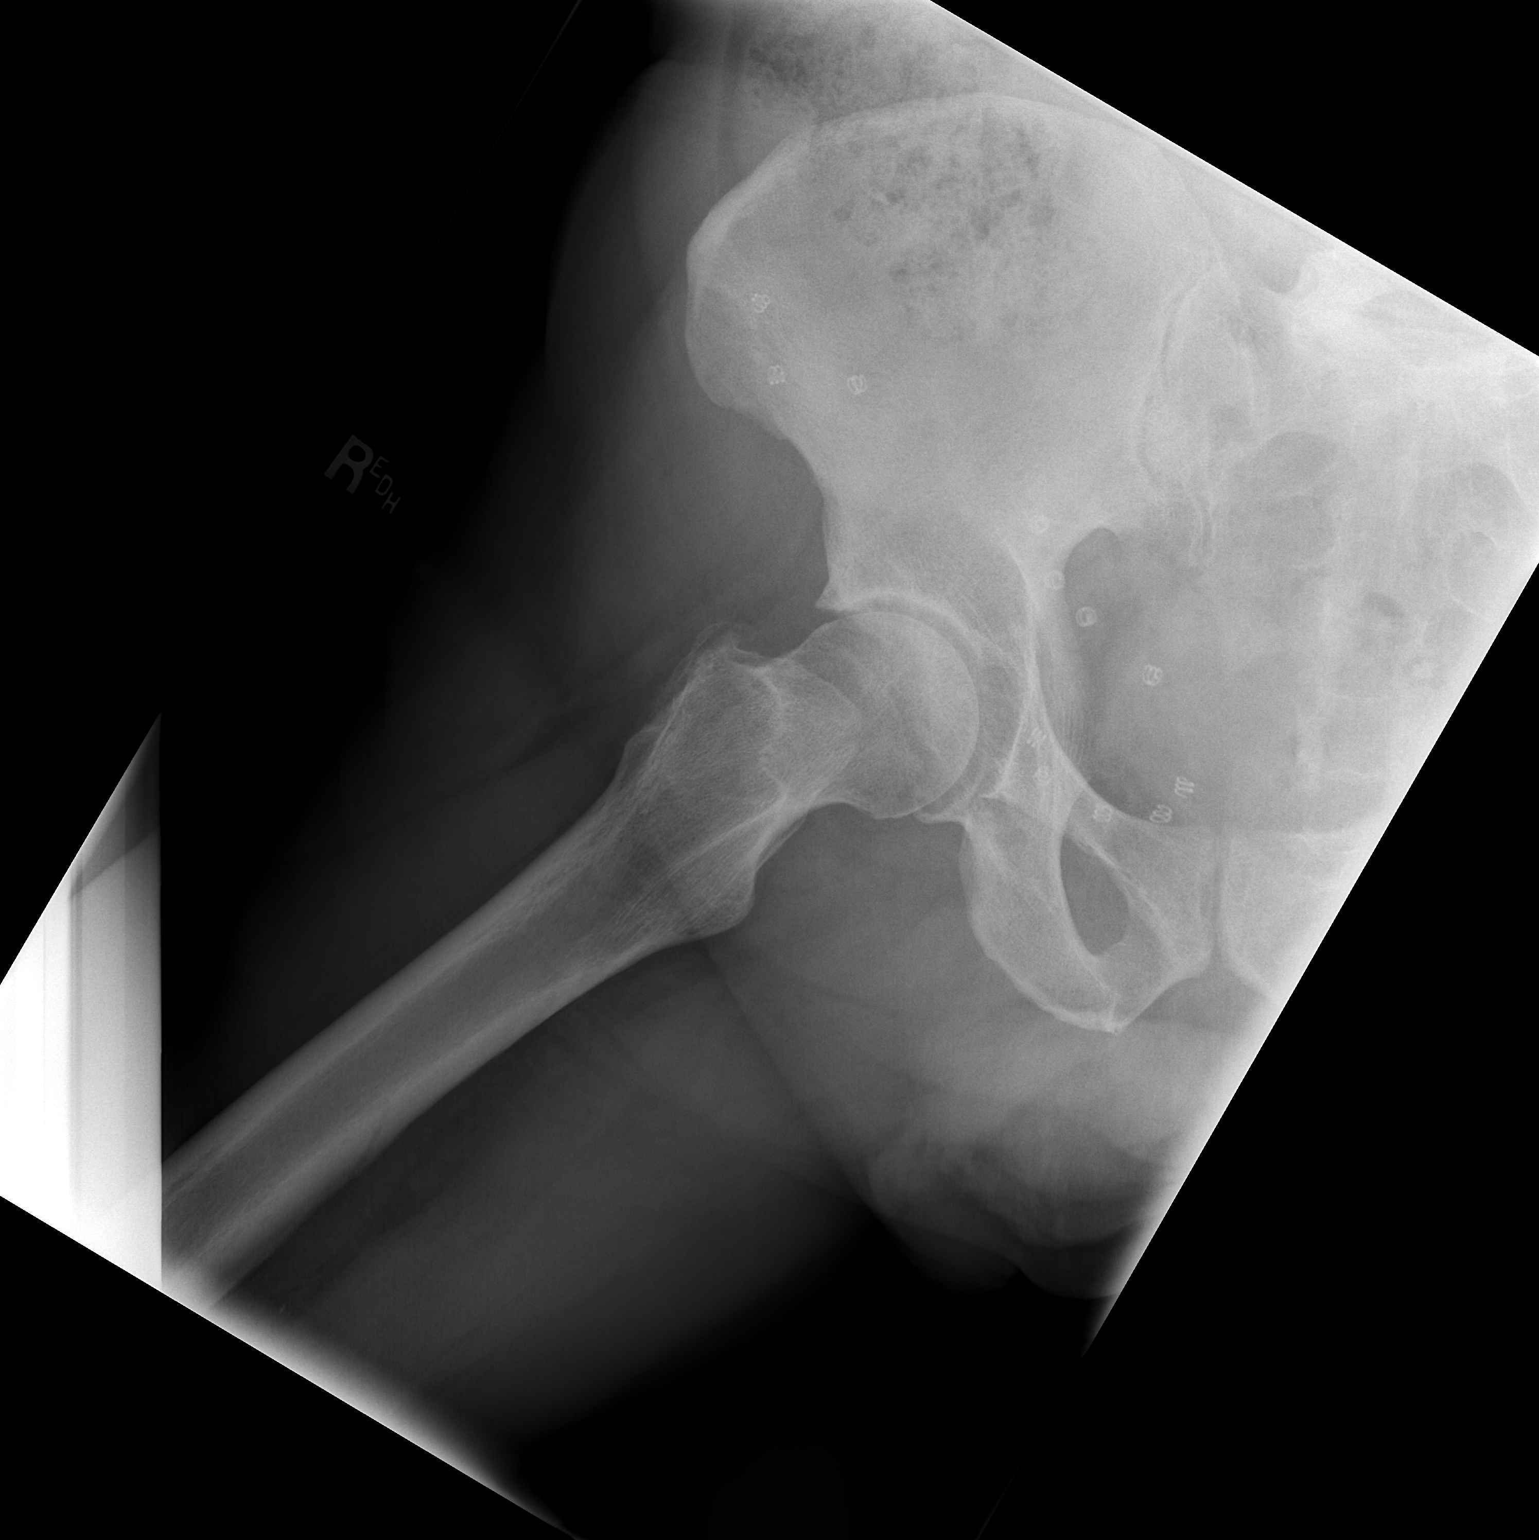

[2 of 2 positions shown; findings below may reference images not displayed]

FINDINGS: There is moderate degenerative joint disease of the hips, worse on
the right where there is more loss of joint space and spurring
present. The pelvic rami are intact. The SI joints appear
corticated. There is some degenerative change in the lower lumbar
spine.
IMPRESSION: Moderate degenerative joint disease of the hips, right worse than
left. No acute abnormality.

## 2018-07-13 DIAGNOSIS — I10 Essential (primary) hypertension: Secondary | ICD-10-CM | POA: Diagnosis not present

## 2018-07-13 DIAGNOSIS — Z0000 Encounter for general adult medical examination without abnormal findings: Secondary | ICD-10-CM | POA: Diagnosis not present

## 2018-07-13 DIAGNOSIS — Z1389 Encounter for screening for other disorder: Secondary | ICD-10-CM | POA: Diagnosis not present

## 2018-07-13 DIAGNOSIS — D696 Thrombocytopenia, unspecified: Secondary | ICD-10-CM | POA: Diagnosis not present

## 2018-08-20 ENCOUNTER — Encounter (INDEPENDENT_AMBULATORY_CARE_PROVIDER_SITE_OTHER): Payer: Self-pay | Admitting: Orthopaedic Surgery

## 2018-08-20 ENCOUNTER — Ambulatory Visit (INDEPENDENT_AMBULATORY_CARE_PROVIDER_SITE_OTHER): Payer: Medicare Other | Admitting: Orthopaedic Surgery

## 2018-08-20 ENCOUNTER — Ambulatory Visit (INDEPENDENT_AMBULATORY_CARE_PROVIDER_SITE_OTHER): Payer: Medicare Other

## 2018-08-20 DIAGNOSIS — Z96641 Presence of right artificial hip joint: Secondary | ICD-10-CM

## 2018-08-20 DIAGNOSIS — M7061 Trochanteric bursitis, right hip: Secondary | ICD-10-CM

## 2018-08-20 MED ORDER — LIDOCAINE HCL 1 % IJ SOLN
3.0000 mL | INTRAMUSCULAR | Status: AC | PRN
  Administered 2018-08-20: 3 mL

## 2018-08-20 MED ORDER — METHYLPREDNISOLONE ACETATE 40 MG/ML IJ SUSP
40.0000 mg | INTRAMUSCULAR | Status: AC | PRN
  Administered 2018-08-20: 40 mg via INTRA_ARTICULAR

## 2018-08-20 NOTE — Progress Notes (Signed)
Office Visit Note   Patient: James Estrada           Date of Birth: [birth year]           MRN: [id number] Visit Date: 08/20/2018              Requested by: Renford Dills, MD [address] [place], [place] [address] PCP: Renford Dills, MD   Assessment & Plan: Visit Diagnoses:  1. History of right hip replacement   2. Trochanteric bursitis, right hip     Plan: I do feel he would benefit from a trochanteric injection over the right hip since he has never had one before.  He knows stretching exercises to try having been to physical therapy previous for this.  He tolerated the steroid injection well.  He understands that if he has any problems with his hip or his left knee do not hesitate to give [place] a call.  All question concerns were answered and addressed.  Follow-up will otherwise be as needed.  Follow-Up Instructions: Return if symptoms worsen or fail to improve.   Orders:  Orders Placed This Encounter  Procedures  . Large Joint Inj  . XR HIP UNILAT W OR W/O PELVIS 1V RIGHT   No orders of the defined types were placed in this encounter.     Procedures: Large Joint Inj: R greater trochanter on 08/20/2018 1:14 PM Indications: pain and diagnostic evaluation Details: 22 G 1.5 in needle, lateral approach  Arthrogram: No  Medications: 3 mL lidocaine 1 %; 40 mg methylPREDNISolone acetate 40 MG/ML Outcome: tolerated well, no immediate complications Procedure, treatment alternatives, risks and benefits explained, specific risks discussed. Consent was given by the patient. Immediately prior to procedure a time out was called to verify the correct patient, procedure, equipment, support staff and site/side marked as required. Patient was prepped and draped in the usual sterile fashion.       Clinical Data: No additional findings.   Subjective: Chief Complaint  Patient presents with  . Right Hip - Follow-up  The patient is well-known to me.  He is a year out  from a right total hip arthroplasty.  He says the hip is doing well.  He only has pain of the trochanteric area and he had this pain prior to surgery.  He does do a lot of exercising.  He 77 years old.  He does a lot of outdoor activities and uses the elliptical.  He recently had 6 weeks worth of left knee pain and swelling but he said since then it is calm down.  Started in the back of his knee.  HPI  Review of Systems He currently denies any headache, chest pain, short of breath, fever, chills, nausea, vomiting.  Objective: Vital Signs: There were no vitals taken for this visit.  Physical Exam He currently denies any headache, chest pain, shortness of breath, fever, chills, nausea, vomiting. Ortho Exam He is alert and oriented x3 and in no acute distress. Examination of his right operative hip shows full internal and external rotation with no problem or pain at all.  He has pain to palpation over the trochanteric area.  His left knee exam is normal today. Specialty Comments:  No specialty comments available.  Imaging: Xr Hip Unilat W Or W/o Pelvis 1v Right  Result Date: 08/20/2018 An AP pelvis and lateral of the right hip shows a total hip arthroplasty with no complicating features.    PMFS History: Patient Active Problem  List   Diagnosis Date Noted  . Trochanteric bursitis, right hip 08/20/2018  . History of right hip replacement 08/20/2018  . Status post total replacement of right hip 09/08/2017  . Pain of right hip joint 08/07/2017  . Unilateral primary osteoarthritis, right hip 08/07/2017  . BPH (benign prostatic hypertrophy) with urinary obstruction 05/15/2013   Past Medical History:  Diagnosis Date  . AAA (abdominal aortic aneurysm) (HCC)    last u/s done 07/18/17   . Arthritis   . Bradycardia   . Dysrhythmia    frequent PAC, for 20 years  . GERD (gastroesophageal reflux disease)    occ, OTC  . Hemorrhoids   . Hyperlipidemia   . Hypertension   . Seasonal  allergies     Family History  Problem Relation Age of Onset  . Heart disease Mother   . Coronary artery disease Mother   . Aneurysm Father   . Diabetes Sister     Past Surgical History:  Procedure Laterality Date  . CATARACT EXTRACTION Bilateral 2009  . HERNIA REPAIR Bilateral 1999, 2006  . pheochromocytoma  1993  . PROSTATECTOMY N/A 05/15/2013   Procedure: PROSTATECTOMY RETROPUBIC; SIMPLE OPEN PROSTATECTOMY;  Surgeon: Valetta Fuller, MD;  Location: [facility];  Service: Urology;  Laterality: N/A;  . TOTAL HIP ARTHROPLASTY Right 09/08/2017   Procedure: RIGHT TOTAL HIP ARTHROPLASTY ANTERIOR APPROACH;  Surgeon: Kathryne Hitch, MD;  Location: [facility];  Service: Orthopedics;  Laterality: Right;   Social History   Occupational History  . Not on file  Tobacco Use  . Smoking status: Never Smoker  . Smokeless tobacco: Never Used  Substance and Sexual Activity  . Alcohol use: Yes    Comment: 2 or 3 drinks on weekends  . Drug use: No  . Sexual activity: Not on file

## 2018-09-18 DIAGNOSIS — D2262 Melanocytic nevi of left upper limb, including shoulder: Secondary | ICD-10-CM | POA: Diagnosis not present

## 2018-09-18 DIAGNOSIS — L821 Other seborrheic keratosis: Secondary | ICD-10-CM | POA: Diagnosis not present

## 2018-09-18 DIAGNOSIS — L218 Other seborrheic dermatitis: Secondary | ICD-10-CM | POA: Diagnosis not present

## 2018-09-18 DIAGNOSIS — D225 Melanocytic nevi of trunk: Secondary | ICD-10-CM | POA: Diagnosis not present

## 2018-11-22 DIAGNOSIS — Z0120 Encounter for dental examination and cleaning without abnormal findings: Secondary | ICD-10-CM | POA: Diagnosis not present

## 2018-12-03 DIAGNOSIS — H52223 Regular astigmatism, bilateral: Secondary | ICD-10-CM | POA: Diagnosis not present

## 2019-05-28 DIAGNOSIS — Z0120 Encounter for dental examination and cleaning without abnormal findings: Secondary | ICD-10-CM | POA: Diagnosis not present

## 2019-08-06 ENCOUNTER — Other Ambulatory Visit: Payer: Self-pay | Admitting: Internal Medicine

## 2019-08-06 DIAGNOSIS — D696 Thrombocytopenia, unspecified: Secondary | ICD-10-CM | POA: Diagnosis not present

## 2019-08-06 DIAGNOSIS — Z1389 Encounter for screening for other disorder: Secondary | ICD-10-CM | POA: Diagnosis not present

## 2019-08-06 DIAGNOSIS — E7800 Pure hypercholesterolemia, unspecified: Secondary | ICD-10-CM | POA: Diagnosis not present

## 2019-08-06 DIAGNOSIS — I714 Abdominal aortic aneurysm, without rupture, unspecified: Secondary | ICD-10-CM

## 2019-08-06 DIAGNOSIS — Z0000 Encounter for general adult medical examination without abnormal findings: Secondary | ICD-10-CM | POA: Diagnosis not present

## 2019-08-12 ENCOUNTER — Ambulatory Visit
Admission: RE | Admit: 2019-08-12 | Discharge: 2019-08-12 | Disposition: A | Discharge status: Home / Self Care | Payer: Medicare Other | Source: Ambulatory Visit | Attending: Internal Medicine | Admitting: Internal Medicine

## 2019-08-12 DIAGNOSIS — I714 Abdominal aortic aneurysm, without rupture, unspecified: Secondary | ICD-10-CM

## 2019-08-16 DIAGNOSIS — I714 Abdominal aortic aneurysm, without rupture: Secondary | ICD-10-CM | POA: Diagnosis not present

## 2019-09-17 ENCOUNTER — Encounter: Payer: Self-pay | Admitting: Vascular Surgery

## 2019-09-17 ENCOUNTER — Other Ambulatory Visit: Payer: Self-pay

## 2019-09-17 ENCOUNTER — Ambulatory Visit (INDEPENDENT_AMBULATORY_CARE_PROVIDER_SITE_OTHER): Payer: Medicare Other | Admitting: Vascular Surgery

## 2019-09-17 VITALS — BP 140/80 | HR 69 | Temp 97.2°F | Resp 20 | Ht 72.0 in | Wt 174.0 lb

## 2019-09-17 DIAGNOSIS — I714 Abdominal aortic aneurysm, without rupture, unspecified: Secondary | ICD-10-CM

## 2019-09-17 NOTE — Progress Notes (Signed)
[facility] [place]  Patient name: James Estrada MRN: [id number] DOB: February 04, 1941 Sex: male  REASON FOR CONSULT: Evaluation of infrarenal abdominal aortic aneurysm  HPI: James Estrada is a 78 y.o. male, who is here today for discussion of ultrasound diagnosis of infrarenal abdominal aortic aneurysm.  He reports that he had a history of what sounds like thoracic aneurysm in his father.  He underwent screening ultrasound with this family history.  I do have a study from 2010 showing ectasia of his infrarenal aorta.  He underwent ultrasound 2 years ago which revealed a 3.8 cm aneurysm.  He underwent repeat ultrasound 1 month ago which revealed expansion to 4.9 cm and is here today for further discussion of this.  He has no symptoms referable to his aneurysm.  He otherwise is in excellent health.  He remains quite active with no cardiac disease  Past Medical History:  Diagnosis Date  . AAA (abdominal aortic aneurysm) (HCC)    last u/s done 07/18/17   . Arthritis   . Bradycardia   . Dysrhythmia    frequent PAC, for 20 years  . GERD (gastroesophageal reflux disease)    occ, OTC  . Hemorrhoids   . Hyperlipidemia   . Hypertension   . Seasonal allergies     Family History  Problem Relation Age of Onset  . Heart disease Mother   . Coronary artery disease Mother   . Aneurysm Father   . Diabetes Sister     SOCIAL HISTORY: Social History   Socioeconomic History  . Marital status: Married    Spouse name: Not on file  . Number of children: Not on file  . Years of education: Not on file  . Highest education level: Not on file  Occupational History  . Not on file  Social Needs  . Financial resource strain: Not on file  . Food insecurity    Worry: Not on file    Inability: Not on file  . Transportation needs    Medical: Not on file    Non-medical: Not on file  Tobacco Use  . Smoking status: Never Smoker  . Smokeless  tobacco: Never Used  Substance and Sexual Activity  . Alcohol use: Yes    Comment: 2 or 3 drinks on weekends  . Drug use: No  . Sexual activity: Not on file  Lifestyle  . Physical activity    Days per week: Not on file    Minutes per session: Not on file  . Stress: Not on file  Relationships  . Social Musician on phone: Not on file    Gets together: Not on file    Attends religious service: Not on file    Active member of club or organization: Not on file    Attends meetings of clubs or organizations: Not on file    Relationship status: Not on file  . Intimate partner violence    Fear of current or ex partner: Not on file    Emotionally abused: Not on file    Physically abused: Not on file    Forced sexual activity: Not on file  Other Topics Concern  . Not on file  Social History Narrative  . Not on file    No Known Allergies  Current Outpatient Medications  Medication Sig Dispense Refill  . ibuprofen (ADVIL,MOTRIN) 200 MG tablet Take 200-400 mg by mouth every 6 (six) hours as needed for headache or moderate  pain.     No current facility-administered medications for this visit.     REVIEW OF SYSTEMS:  [X]  denotes positive finding, [ ]  denotes negative finding Cardiac  Comments:  Chest pain or chest pressure:    Shortness of breath upon exertion:    Short of breath when lying flat:    Irregular heart rhythm:        Vascular    Pain in calf, thigh, or hip brought on by ambulation:    Pain in feet at night that wakes you up from your sleep:     Blood clot in your veins:    Leg swelling:         Pulmonary    Oxygen at home:    Productive cough:     Wheezing:         Neurologic    Sudden weakness in arms or legs:     Sudden numbness in arms or legs:     Sudden onset of difficulty speaking or slurred speech:    Temporary loss of vision in one eye:     Problems with dizziness:         Gastrointestinal    Blood in stool:     Vomited blood:          Genitourinary    Burning when urinating:     Blood in urine:        Psychiatric    Major depression:         Hematologic    Bleeding problems:    Problems with blood clotting too easily:        Skin    Rashes or ulcers:        Constitutional    Fever or chills:      PHYSICAL EXAM: Vitals:   09/17/19 1358  BP: 140/80  Pulse: 69  Resp: 20  Temp: (!) 97.2 F (36.2 C)  SpO2: 99%  Weight: 174 lb (78.9 kg)  Height: 6' (1.829 m)    GENERAL: The patient is a well-nourished male, in no acute distress. The vital signs are documented above. CARDIOVASCULAR: Carotid arteries without bruits bilaterally.  2+ radial and 2+ femoral pulses.  By physical exam he has popliteal artery aneurysms bilaterally larger in the right than on the left.  I do not feel abdominal aneurysm PULMONARY: There is good air exchange  ABDOMEN: Soft and non-tender  MUSCULOSKELETAL: There are no major deformities or cyanosis. NEUROLOGIC: No focal weakness or paresthesias are detected. SKIN: There are no ulcers or rashes noted. PSYCHIATRIC: The patient has a normal affect.  DATA:  Ultrasound reveals maximal diameter 4.9 cm infrarenal aortic aneurysm  MEDICAL ISSUES: I discussed the significance at great length with the patient.  Explained that his asymptomatic disease and 4.9 cm aneurysm we would recommend nonoperative treatment currently.  I am concerned regarding the possibility of bilateral popliteal artery aneurysms.  He does by physical exam feel to have a large aneurysm in his right popliteal space.  We will evaluate this further with CT of his abdomen pelvis and runoff for better visualization of his aortic aneurysm and of his popliteal arteries.  We will see him back in the office following this for continued discussion   [name], MD [facility] Tel [phone] Pager [phone]

## 2019-09-18 ENCOUNTER — Other Ambulatory Visit: Payer: Self-pay | Admitting: Vascular Surgery

## 2019-09-18 DIAGNOSIS — I714 Abdominal aortic aneurysm, without rupture, unspecified: Secondary | ICD-10-CM

## 2019-10-01 DIAGNOSIS — D1801 Hemangioma of skin and subcutaneous tissue: Secondary | ICD-10-CM | POA: Diagnosis not present

## 2019-10-01 DIAGNOSIS — D225 Melanocytic nevi of trunk: Secondary | ICD-10-CM | POA: Diagnosis not present

## 2019-10-01 DIAGNOSIS — L821 Other seborrheic keratosis: Secondary | ICD-10-CM | POA: Diagnosis not present

## 2019-10-02 ENCOUNTER — Other Ambulatory Visit: Payer: Self-pay

## 2019-10-02 ENCOUNTER — Ambulatory Visit
Admission: RE | Admit: 2019-10-02 | Discharge: 2019-10-02 | Disposition: A | Discharge status: Home / Self Care | Payer: Medicare Other | Source: Ambulatory Visit | Attending: Vascular Surgery | Admitting: Vascular Surgery

## 2019-10-02 DIAGNOSIS — I714 Abdominal aortic aneurysm, without rupture, unspecified: Secondary | ICD-10-CM

## 2019-10-02 MED ORDER — IOPAMIDOL (ISOVUE-370) INJECTION 76%
75.0000 mL | Freq: Once | INTRAVENOUS | Status: AC | PRN
  Administered 2019-10-02: 75 mL via INTRAVENOUS

## 2019-10-07 ENCOUNTER — Other Ambulatory Visit: Payer: Self-pay | Admitting: *Deleted

## 2019-10-07 DIAGNOSIS — I714 Abdominal aortic aneurysm, without rupture, unspecified: Secondary | ICD-10-CM

## 2019-10-07 DIAGNOSIS — I739 Peripheral vascular disease, unspecified: Secondary | ICD-10-CM

## 2019-10-15 ENCOUNTER — Other Ambulatory Visit: Payer: Self-pay

## 2019-10-15 ENCOUNTER — Ambulatory Visit (HOSPITAL_COMMUNITY)
Admission: RE | Admit: 2019-10-15 | Discharge: 2019-10-15 | Disposition: A | Discharge status: Home / Self Care | Payer: Medicare Other | Source: Ambulatory Visit | Attending: Family | Admitting: Family

## 2019-10-15 ENCOUNTER — Encounter: Payer: Self-pay | Admitting: Vascular Surgery

## 2019-10-15 ENCOUNTER — Ambulatory Visit: Payer: Medicare Other | Admitting: Vascular Surgery

## 2019-10-15 VITALS — BP 138/76 | HR 67 | Temp 97.5°F | Resp 20 | Ht 72.0 in | Wt 177.5 lb

## 2019-10-15 DIAGNOSIS — I714 Abdominal aortic aneurysm, without rupture, unspecified: Secondary | ICD-10-CM

## 2019-10-15 DIAGNOSIS — I739 Peripheral vascular disease, unspecified: Secondary | ICD-10-CM | POA: Diagnosis not present

## 2019-10-15 NOTE — Progress Notes (Signed)
[facility] [place]  Patient name: James Estrada MRN: [id number] DOB: 1941-01-14 Sex: male  REASON FOR VISIT: Discussion of CT scan evaluation of abdominal aortic aneurysm and also duplex of popliteal arteries.  HPI: James Estrada is a 78 y.o. male here for continued follow-up.  I did seen him several weeks ago with ultrasound showing 5 cm aneurysm.  He subsequently underwent CT scan of his abdomen and pelvis.  I had intended for him to obtain runoff views as well due to prominent popliteal pulses by physical exam.  Due to a clerical error in our office, this was not done.  He did undergo duplex of his popliteal arteries today for evaluation.  Past Medical History:  Diagnosis Date  . AAA (abdominal aortic aneurysm) (HCC)    last u/s done 07/18/17   . Arthritis   . Bradycardia   . Dysrhythmia    frequent PAC, for 20 years  . GERD (gastroesophageal reflux disease)    occ, OTC  . Hemorrhoids   . Hyperlipidemia   . Hypertension   . Seasonal allergies     Family History  Problem Relation Age of Onset  . Heart disease Mother   . Coronary artery disease Mother   . Aneurysm Father   . Diabetes Sister     SOCIAL HISTORY: Social History   Tobacco Use  . Smoking status: Never Smoker  . Smokeless tobacco: Never Used  Substance Use Topics  . Alcohol use: Yes    Comment: 2 or 3 drinks on weekends    No Known Allergies  Current Outpatient Medications  Medication Sig Dispense Refill  . ibuprofen (ADVIL,MOTRIN) 200 MG tablet Take 200-400 mg by mouth every 6 (six) hours as needed for headache or moderate pain.     No current facility-administered medications for this visit.     REVIEW OF SYSTEMS:  [X]  denotes positive finding, [ ]  denotes negative finding Cardiac  Comments:  Chest pain or chest pressure:    Shortness of breath upon exertion:    Short of breath when lying flat:    Irregular heart rhythm:     Vascular    Pain in calf, thigh, or hip brought on by ambulation:    Pain in feet at night that wakes you up from your sleep:     Blood clot in your veins:    Leg swelling:           PHYSICAL EXAM: Vitals:   10/15/19 1449  BP: 138/76  Pulse: 67  Resp: 20  Temp: (!) 97.5 F (36.4 C)  SpO2: 99%  Weight: 177 lb 8 oz (80.5 kg)  Height: 6' (1.829 m)    GENERAL: The patient is a well-nourished male, in no acute distress. The vital signs are documented above. CARDIOVASCULAR: 2+ radial and 2+ dorsalis pedis pulses bilaterally.  Prominent popliteal pulses bilaterally PULMONARY: There is good air exchange  MUSCULOSKELETAL: There are no major deformities or cyanosis. NEUROLOGIC: No focal weakness or paresthesias are detected. SKIN: There are no ulcers or rashes noted. PSYCHIATRIC: The patient has a normal affect.  DATA:  I reviewed the CT images with the patient.  He does have a 5 cm infrarenal abdominal aortic aneurysm.  He has diffuse arteriomegaly.  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   Popliteal artery duplexes today reveal no aneurysm by dilatation on the right at  1.65 cm on the left 1.40 cm  MEDICAL ISSUES: Again discussed the significance of his infrarenal abdominal aortic aneurysm and diffuse arteriomegaly.  He has not had any imaging of his chest.  I have recommended that we see him again in 6 months with CT of chest abdomen pelvis and runoff for continued follow-up of his diffuse arteriomegaly and abdominal and iliac artery aneurysms.  I again discussed symptoms of leaking aneurysm and he knows to present immediately to the emergency room should this occur    [name], MD [facility] Tel [phone] Pager [phone]

## 2019-12-06 ENCOUNTER — Ambulatory Visit: Payer: Medicare Other | Attending: Internal Medicine

## 2019-12-06 DIAGNOSIS — Z23 Encounter for immunization: Secondary | ICD-10-CM | POA: Insufficient documentation

## 2019-12-06 NOTE — Progress Notes (Signed)
   Covid-19 Vaccination Clinic  Name:  James Estrada    MRN: [id number] DOB: Apr 09, 1941  12/06/2019  [name] was observed post Covid-19 immunization for 15 minutes without incidence. He was provided with Vaccine Information Sheet and instruction to access the V-Safe system.   James Estrada was instructed to call 911 with any severe reactions post vaccine: Marland Kitchen Difficulty breathing  . Swelling of your face and throat  . A fast heartbeat  . A bad rash all over your body  . Dizziness and weakness    Immunizations Administered    Name Date Dose VIS Date Route   Pfizer COVID-19 Vaccine 12/06/2019 12:09 PM 0.3 mL 10/25/2019 Intramuscular   Manufacturer: [employer], [employer]   Lot: [id number]   NDC: [address]-0

## 2019-12-09 ENCOUNTER — Ambulatory Visit: Payer: Medicare Other

## 2019-12-24 ENCOUNTER — Ambulatory Visit: Payer: Medicare Other | Attending: Internal Medicine

## 2019-12-24 DIAGNOSIS — Z23 Encounter for immunization: Secondary | ICD-10-CM | POA: Insufficient documentation

## 2019-12-24 NOTE — Progress Notes (Signed)
   Covid-19 Vaccination Clinic  Name:  James Estrada    MRN: [id number] DOB: 1940-12-27  12/24/2019  [name] was observed post Covid-19 immunization for 15 minutes without incidence. He was provided with Vaccine Information Sheet and instruction to access the V-Safe system.   [name] was instructed to call 911 with any severe reactions post vaccine: Marland Kitchen Difficulty breathing  . Swelling of your face and throat  . A fast heartbeat  . A bad rash all over your body  . Dizziness and weakness    Immunizations Administered    Name Date Dose VIS Date Route   Pfizer COVID-19 Vaccine 12/24/2019 10:47 AM 0.3 mL 10/25/2019 Intramuscular   Manufacturer: [employer], [employer]   Lot: [id number]   NDC: [address]-6

## 2020-02-18 DIAGNOSIS — Z0120 Encounter for dental examination and cleaning without abnormal findings: Secondary | ICD-10-CM | POA: Diagnosis not present

## 2020-02-20 DIAGNOSIS — H524 Presbyopia: Secondary | ICD-10-CM | POA: Diagnosis not present

## 2020-03-12 ENCOUNTER — Other Ambulatory Visit: Payer: Self-pay

## 2020-03-12 DIAGNOSIS — I714 Abdominal aortic aneurysm, without rupture, unspecified: Secondary | ICD-10-CM

## 2020-04-06 ENCOUNTER — Other Ambulatory Visit: Payer: Medicare Other

## 2020-04-07 ENCOUNTER — Inpatient Hospital Stay: Admission: RE | Admit: 2020-04-07 | Payer: Medicare Other | Source: Ambulatory Visit

## 2020-04-07 ENCOUNTER — Ambulatory Visit
Admission: RE | Admit: 2020-04-07 | Discharge: 2020-04-07 | Disposition: A | Discharge status: Home / Self Care | Payer: Medicare Other | Source: Ambulatory Visit | Attending: Vascular Surgery | Admitting: Vascular Surgery

## 2020-04-07 DIAGNOSIS — I714 Abdominal aortic aneurysm, without rupture, unspecified: Secondary | ICD-10-CM

## 2020-04-07 DIAGNOSIS — I700 Atherosclerosis of aorta: Secondary | ICD-10-CM | POA: Diagnosis not present

## 2020-04-07 MED ORDER — IOPAMIDOL (ISOVUE-370) INJECTION 76%
100.0000 mL | Freq: Once | INTRAVENOUS | Status: AC | PRN
  Administered 2020-04-07: 100 mL via INTRAVENOUS

## 2020-04-14 ENCOUNTER — Encounter: Payer: Self-pay | Admitting: Vascular Surgery

## 2020-04-14 ENCOUNTER — Ambulatory Visit: Payer: Medicare Other | Admitting: Vascular Surgery

## 2020-04-14 ENCOUNTER — Telehealth: Payer: Self-pay | Admitting: Cardiology

## 2020-04-14 ENCOUNTER — Other Ambulatory Visit: Payer: Self-pay

## 2020-04-14 VITALS — BP 137/83 | HR 81 | Temp 97.9°F | Resp 20 | Ht 72.0 in | Wt 175.0 lb

## 2020-04-14 DIAGNOSIS — I714 Abdominal aortic aneurysm, without rupture, unspecified: Secondary | ICD-10-CM

## 2020-04-14 NOTE — Telephone Encounter (Signed)
   [facility] Pre-operative Risk Assessment    HEARTCARE STAFF: - Please ensure there is not already an duplicate clearance open for this procedure. - Under Visit Info/Reason for Call, type in Other and utilize the format Clearance MM/DD/YY or Clearance TBD. Do not use dashes or single digits. - If request is for dental extraction, please clarify the # of teeth to be extracted.  Request for surgical clearance:  1. What type of surgery is being performed? AAA  2. When is this surgery scheduled?  05/04/2020  3. What type of clearance is required (medical clearance vs. Pharmacy clearance to hold med vs. Both)? Medical  4. Are there any medications that need to be held prior to surgery and how long? Leaving up to Cardiology  5. Practice name and name of physician performing surgery? [facility], Dr. Sherren Mocha Early  6. What is the office phone number? [phone]   7.   What is the office fax number? [phone]  8.   Anesthesia type (None, local, MAC, general) ? Not known   James Estrada 04/14/2020, 3:20 PM  _________________________________________________________________   (provider comments below)

## 2020-04-14 NOTE — Telephone Encounter (Signed)
This is a new patient to cardiology service, currently scheduled to see Dr. Katrinka Blazing for preoperative clearance. Will remove from the pool since preop will be addressed on the next visit.

## 2020-04-14 NOTE — Progress Notes (Signed)
[facility] [place]  Patient name: James Estrada MRN: [id number] DOB: [birth year] Sex: male  REASON FOR VISIT: Follow-up known abdominal aortic aneurysm  HPI: James Estrada is a 79 y.o. male here today for follow-up of his known abdominal aortic aneurysm.  I did see him 6 months ago with CT scan showing maximal aneurysm size approximately 5.0 cm.  He is here today for further discussion after CT of his chest abdomen pelvis and runoff.  His wife is with him today for discussion.  He remains in excellent health.  He is quite active.  He is no cardiac issues other than occasional PACs.  Has no symptoms referable to his aneurysm  Past Medical History:  Diagnosis Date  . AAA (abdominal aortic aneurysm) (HCC)    last u/s done 07/18/17   . Arthritis   . Bradycardia   . Dysrhythmia    frequent PAC, for 20 years  . GERD (gastroesophageal reflux disease)    occ, OTC  . Hemorrhoids   . Hyperlipidemia   . Hypertension   . Seasonal allergies     Family History  Problem Relation Age of Onset  . Heart disease Mother   . Coronary artery disease Mother   . Aneurysm Father   . Diabetes Sister     SOCIAL HISTORY: Social History   Tobacco Use  . Smoking status: Never Smoker  . Smokeless tobacco: Never Used  Substance Use Topics  . Alcohol use: Yes    Comment: 2 or 3 drinks on weekends    No Known Allergies  Current Outpatient Medications  Medication Sig Dispense Refill  . ibuprofen (ADVIL,MOTRIN) 200 MG tablet Take 200-400 mg by mouth every 6 (six) hours as needed for headache or moderate pain.     No current facility-administered medications for this visit.    REVIEW OF SYSTEMS:  [X]  denotes positive finding, [ ]  denotes negative finding Cardiac  Comments:  Chest pain or chest pressure:    Shortness of breath upon exertion:    Short of breath when lying flat:    Irregular heart rhythm: x       Vascular    Pain  in calf, thigh, or hip brought on by ambulation:    Pain in feet at night that wakes you up from your sleep:     Blood clot in your veins:    Leg swelling:           PHYSICAL EXAM: Vitals:   04/14/20 1303  BP: 137/83  Pulse: 81  Resp: 20  Temp: 97.9 F (36.6 C)  SpO2: 93%  Weight: 175 lb (79.4 kg)  Height: 6' (1.829 m)    GENERAL: The patient is a well-nourished male, in no acute distress. The vital signs are documented above. CARDIOVASCULAR: 2+ radial pulses bilaterally.  2+ femoral 2-3+ popliteal and 2+ dorsalis pedis pulses.  He does have a prominent aortic pulsation but no tenderness in his abdomen PULMONARY: There is good air exchange  MUSCULOSKELETAL: There are no major deformities or cyanosis. NEUROLOGIC: No focal weakness or paresthesias are detected. SKIN: There are no ulcers or rashes noted. PSYCHIATRIC: The patient has a normal affect.  DATA:  CT scan from 1 week prior was reviewed with the actual images with the patient and his wife.  He has had significant progression in size with now maximal diameter of his infrarenal aorta at 5.4 cm.  His excised just below the renal arteries is normal and he  does have a long infrarenal aortic neck prior to the beginning of his aneurysm.  He does have tortuosity and ectasia of his common iliac arteries bilaterally.  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  He does not have any evidence of femoral or popliteal artery aneurysms although he does have diffuse arteriomegaly.  MEDICAL ISSUES: Patient has had significant increase in size of his aneurysm in 6 months.  He is in excellent health and quite active at his age of 78.  I have recommended elective repair.  I discussed options for distal attachment either with flared stents to seal his ectatic iliac arteries bilaterally.  Also discussed the potential for coiling of his internal iliac artery on the left and extending the left limb into the external iliac artery.   My recommendation is to these flared stents into his common iliac arteries preserving his internal iliac artery flow bilaterally.  Explained that it would be very straightforward to extend into his external iliac and coil his internal iliac in the future in the unlikely event that this expands and causes difficulty.  We will have him see cardiology for preoperative clearance and then schedule elective stent graft repair at his convenience    [name] F. Arienne Gartin, MD FACS [facility] [place] Tel [phone] Pager [phone]  

## 2020-04-14 NOTE — H&P (View-Only) (Signed)
[facility] [place]  Patient name: James Estrada MRN: [id number] DOB: [birth year] Sex: male  REASON FOR VISIT: Follow-up known abdominal aortic aneurysm  HPI: James Estrada is a 79 y.o. male here today for follow-up of his known abdominal aortic aneurysm.  I did see him 6 months ago with CT scan showing maximal aneurysm size approximately 5.0 cm.  He is here today for further discussion after CT of his chest abdomen pelvis and runoff.  His wife is with him today for discussion.  He remains in excellent health.  He is quite active.  He is no cardiac issues other than occasional PACs.  Has no symptoms referable to his aneurysm  Past Medical History:  Diagnosis Date  . AAA (abdominal aortic aneurysm) (HCC)    last u/s done 07/18/17   . Arthritis   . Bradycardia   . Dysrhythmia    frequent PAC, for 20 years  . GERD (gastroesophageal reflux disease)    occ, OTC  . Hemorrhoids   . Hyperlipidemia   . Hypertension   . Seasonal allergies     Family History  Problem Relation Age of Onset  . Heart disease Mother   . Coronary artery disease Mother   . Aneurysm Father   . Diabetes Sister     SOCIAL HISTORY: Social History   Tobacco Use  . Smoking status: Never Smoker  . Smokeless tobacco: Never Used  Substance Use Topics  . Alcohol use: Yes    Comment: 2 or 3 drinks on weekends    No Known Allergies  Current Outpatient Medications  Medication Sig Dispense Refill  . ibuprofen (ADVIL,MOTRIN) 200 MG tablet Take 200-400 mg by mouth every 6 (six) hours as needed for headache or moderate pain.     No current facility-administered medications for this visit.    REVIEW OF SYSTEMS:  [X]  denotes positive finding, [ ]  denotes negative finding Cardiac  Comments:  Chest pain or chest pressure:    Shortness of breath upon exertion:    Short of breath when lying flat:    Irregular heart rhythm: x       Vascular    Pain  in calf, thigh, or hip brought on by ambulation:    Pain in feet at night that wakes you up from your sleep:     Blood clot in your veins:    Leg swelling:           PHYSICAL EXAM: Vitals:   04/14/20 1303  BP: 137/83  Pulse: 81  Resp: 20  Temp: 97.9 F (36.6 C)  SpO2: 93%  Weight: 175 lb (79.4 kg)  Height: 6' (1.829 m)    GENERAL: The patient is a well-nourished male, in no acute distress. The vital signs are documented above. CARDIOVASCULAR: 2+ radial pulses bilaterally.  2+ femoral 2-3+ popliteal and 2+ dorsalis pedis pulses.  He does have a prominent aortic pulsation but no tenderness in his abdomen PULMONARY: There is good air exchange  MUSCULOSKELETAL: There are no major deformities or cyanosis. NEUROLOGIC: No focal weakness or paresthesias are detected. SKIN: There are no ulcers or rashes noted. PSYCHIATRIC: The patient has a normal affect.  DATA:  CT scan from 1 week prior was reviewed with the actual images with the patient and his wife.  He has had significant progression in size with now maximal diameter of his infrarenal aorta at 5.4 cm.  His excised just below the renal arteries is normal and he  does have a long infrarenal aortic neck prior to the beginning of his aneurysm.  He does have tortuosity and ectasia of his common iliac arteries bilaterally.  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  He does not have any evidence of femoral or popliteal artery aneurysms although he does have diffuse arteriomegaly.  MEDICAL ISSUES: Patient has had significant increase in size of his aneurysm in 6 months.  He is in excellent health and quite active at his age of 19.  I have recommended elective repair.  I discussed options for distal attachment either with flared stents to seal his ectatic iliac arteries bilaterally.  Also discussed the potential for coiling of his internal iliac artery on the left and extending the left limb into the external iliac artery.   My recommendation is to these flared stents into his common iliac arteries preserving his internal iliac artery flow bilaterally.  Explained that it would be very straightforward to extend into his external iliac and coil his internal iliac in the future in the unlikely event that this expands and causes difficulty.  We will have him see cardiology for preoperative clearance and then schedule elective stent graft repair at his convenience    [name], MD [place] [facility] Tel [phone] Pager [phone]

## 2020-04-15 ENCOUNTER — Other Ambulatory Visit: Payer: Self-pay

## 2020-04-26 NOTE — Progress Notes (Signed)
Cardiology Office Note:    Date:  04/28/2020   ID:  James Estrada, DOB [birth year], MRN [id number]  PCP:  Little Ishikawa, MD  Cardiologist:  No primary care provider on file.  Electrophysiologist:  None   Referring MD: Renford Dills, MD   Chief Complaint  Patient presents with  . Pre-op Exam    History of Present Illness:    James Estrada is a 79 y.o. male with a hx of a abdominal aortic aneurysm, frequent PACs, GERD, hypertension, hyperlipidemia who is referred by Dr. Doreene Adas for preoperative evaluation prior to aortic aneurysm repair.  Recent CT showed a maximal diameter of infrarenal aorta 5.4 cm.  He was referred for preoperative evaluation prior to EVAR.  He is very active, goes to the gym 6 days/week.  He does weights and elliptical for 25 to 30 minutes.  States resting heart rate is usually around 50, he will get heart rate up to 120s when exercising.  He also plays golf 1-2 times per week.  He denies any exertional chest pain or dyspnea.  Denies any palpitations, syncope, or lower extremity edema.  Does report occasional lightheadedness with standing.   Past Medical History:  Diagnosis Date  . AAA (abdominal aortic aneurysm) (HCC)    last u/s done 07/18/17   . Arthritis   . Bradycardia   . Dysrhythmia    frequent PAC, for 20 years  . GERD (gastroesophageal reflux disease)    occ, OTC  . Hemorrhoids   . Hyperlipidemia   . Hypertension   . Seasonal allergies     Past Surgical History:  Procedure Laterality Date  . CATARACT EXTRACTION Bilateral 2009  . HERNIA REPAIR Bilateral 1999, 2006  . pheochromocytoma  1993  . PROSTATECTOMY N/A 05/15/2013   Procedure: PROSTATECTOMY RETROPUBIC; SIMPLE OPEN PROSTATECTOMY;  Surgeon: Valetta Fuller, MD;  Location: [facility];  Service: Urology;  Laterality: N/A;  . TOTAL HIP ARTHROPLASTY Right 09/08/2017   Procedure: RIGHT TOTAL HIP ARTHROPLASTY ANTERIOR APPROACH;  Surgeon: Kathryne Hitch, MD;  Location: [facility];   Service: Orthopedics;  Laterality: Right;    Current Medications: No outpatient medications have been marked as taking for the 04/28/20 encounter (Office Visit) with Little Ishikawa, MD.     Allergies:   Patient has no known allergies.   Social History   Socioeconomic History  . Marital status: Married    Spouse name: Not on file  . Number of children: Not on file  . Years of education: Not on file  . Highest education level: Not on file  Occupational History  . Not on file  Tobacco Use  . Smoking status: Never Smoker  . Smokeless tobacco: Never Used  Vaping Use  . Vaping Use: Never used  Substance and Sexual Activity  . Alcohol use: Yes    Comment: 2 or 3 drinks on weekends  . Drug use: No  . Sexual activity: Not on file  Other Topics Concern  . Not on file  Social History Narrative  . Not on file   Social Determinants of Health   Financial Resource Strain:   . Difficulty of Paying Living Expenses:   Food Insecurity:   . Worried About Programme researcher, broadcasting/film/video in the Last Year:   . Barista in the Last Year:   Transportation Needs:   . Freight forwarder (Medical):   Marland Kitchen Lack of Transportation (Non-Medical):   Physical Activity:   . Days of Exercise per  Week:   . Minutes of Exercise per Session:   Stress:   . Feeling of Stress :   Social Connections:   . Frequency of Communication with Friends and Family:   . Frequency of Social Gatherings with Friends and Family:   . Attends Religious Services:   . Active Member of Clubs or Organizations:   . Attends Banker Meetings:   Marland Kitchen Marital Status:      Family History: The patient's family history includes Aneurysm in his father; Coronary artery disease in his mother; Diabetes in his sister; Heart disease in his mother.  ROS:   Please see the history of present illness.     All other systems reviewed and are negative.  EKGs/Labs/Other Studies Reviewed:    The following studies were  reviewed today:   EKG:  EKG is ordered today.  The ekg ordered today demonstrates sinus bradycardia, rate 47, PAC, Q waves in V1/2  Recent Labs: No results found for requested labs within last 8760 hours.  Recent Lipid Panel No results found for: CHOL, TRIG, HDL, CHOLHDL, VLDL, LDLCALC, LDLDIRECT  Physical Exam:    VS:  BP 125/72   Pulse (!) 47   Temp (!) 96.3 F (35.7 C)   Ht 6' (1.829 m)   Wt 177 lb 12.8 oz (80.6 kg)   SpO2 99%   BMI 24.11 kg/m     Wt Readings from Last 3 Encounters:  04/28/20 177 lb 12.8 oz (80.6 kg)  04/14/20 175 lb (79.4 kg)  10/15/19 177 lb 8 oz (80.5 kg)      in no acute distress HEENT: Normal NECK: No JVD; No carotid bruits LYMPHATICS: No lymphadenopathy CARDIAC: Bradycardic, irregular, no murmurs, rubs, gallops RESPIRATORY:  Clear to auscultation without rales, wheezing or rhonchi  ABDOMEN: Soft, non-tender, non-distended MUSCULOSKELETAL:  No edema; No deformity  SKIN: Warm and dry NEUROLOGIC:  Alert and oriented x 3 PSYCHIATRIC:  Normal affect   ASSESSMENT:    1. Pre-operative cardiovascular examination   2. Premature atrial contractions   3. Sinus bradycardia    PLAN:    Preop evaluation: Prior to endovascular repair of abdominal aortic aneurysm.  No symptoms to suggest active cardiac condition.  Excellent functional capacity.  RCRI score 1 given vascular surgery, corresponding to 6% 30-day risk of MI, cardiac arrest, or death.  No further cardiac work-up recommended prior to his surgery.  Frequent PACs/sinus bradycardia: Asymptomatic.  Could plan cardiac monitor in the future if having any symptoms  RTC in 3 months   Medication Adjustments/Labs and Tests Ordered: Current medicines are reviewed at length with the patient today.  Concerns regarding medicines are outlined above.  Orders Placed This Encounter  Procedures  . EKG 12-Lead   No orders of the defined types were placed in this encounter.   Patient Instructions    Medication Instructions:  No changes *If you need a refill on your cardiac medications before your next appointment, please call your pharmacy*   Lab Work: None If you have labs (blood work) drawn today and your tests are completely normal, you will receive your results only by: Marland Kitchen MyChart Message (if you have MyChart) OR . A paper copy in the mail If you have any lab test that is abnormal or we need to change your treatment, we will call you to review the results.   Testing/Procedures: None   Follow-Up: At [facility], you and your health needs are our priority.  As part of our continuing mission  to provide you with exceptional heart care, we have created designated Provider Care Teams.  These Care Teams include your primary Cardiologist (physician) and Advanced Practice Providers (APPs -  Physician Assistants and Nurse Practitioners) who all work together to provide you with the care you need, when you need it.  Your next appointment:   3 month(s)  The format for your next appointment:   In Person  Provider:   Oswaldo Milian, MD       Signed, Donato Heinz, MD  04/28/2020 11:48 PM    [place]

## 2020-04-28 ENCOUNTER — Other Ambulatory Visit: Payer: Self-pay

## 2020-04-28 ENCOUNTER — Ambulatory Visit: Payer: Medicare Other | Admitting: Cardiology

## 2020-04-28 VITALS — BP 125/72 | HR 47 | Temp 96.3°F | Ht 72.0 in | Wt 177.8 lb

## 2020-04-28 DIAGNOSIS — Z01810 Encounter for preprocedural cardiovascular examination: Secondary | ICD-10-CM | POA: Diagnosis not present

## 2020-04-28 DIAGNOSIS — I491 Atrial premature depolarization: Secondary | ICD-10-CM | POA: Diagnosis not present

## 2020-04-28 DIAGNOSIS — R001 Bradycardia, unspecified: Secondary | ICD-10-CM

## 2020-04-28 NOTE — Patient Instructions (Signed)
Medication Instructions:  No changes *If you need a refill on your cardiac medications before your next appointment, please call your pharmacy*   Lab Work: None If you have labs (blood work) drawn today and your tests are completely normal, you will receive your results only by: Marland Kitchen MyChart Message (if you have MyChart) OR . A paper copy in the mail If you have any lab test that is abnormal or we need to change your treatment, we will call you to review the results.   Testing/Procedures: None   Follow-Up: At [facility], you and your health needs are our priority.  As part of our continuing mission to provide you with exceptional heart care, we have created designated Provider Care Teams.  These Care Teams include your primary Cardiologist (physician) and Advanced Practice Providers (APPs -  Physician Assistants and Nurse Practitioners) who all work together to provide you with the care you need, when you need it.  Your next appointment:   3 month(s)  The format for your next appointment:   In Person  Provider:   Epifanio Lesches, MD

## 2020-04-29 NOTE — Progress Notes (Signed)
No type and screen ordered for upcoming procedure on 05/04/20. Called Dr. Bosie Helper office to clarify if one was needed. Left VM.

## 2020-04-29 NOTE — Addendum Note (Signed)
Addended by: Julious Payer C on: 04/29/2020 02:27 PM   Modules accepted: Orders

## 2020-04-29 NOTE — Progress Notes (Signed)
[facility] [phone] - [place], [place] [address] [place] [address] Phone: [phone] Fax: [phone]      Your procedure is scheduled on Monday, June 21st.  Report to [facility] Main Entrance "A" at 5:30 A.M., and check in at the Admitting office.  Call this number if you have problems the morning of surgery:  [phone]  Call [phone] if you have any questions prior to your surgery date Monday-Friday 8am-4pm    Remember:  Do not eat or drink after midnight the night before your surgery     Take these medicines the morning of surgery with A SIP OF WATER   NONE  As of today, STOP taking any Aspirin (unless otherwise instructed by your surgeon) and Aspirin containing products, Aleve, Naproxen, Ibuprofen, Motrin, Advil, Goody's, BC's, all herbal medications, fish oil, and all vitamins.                      Do not wear jewelry            Do not wear lotions, powders, colognes, or deodorant.            Men may shave face and neck.            Do not bring valuables to the hospital.            [facility] is not responsible for any belongings or valuables.  Do NOT Smoke (Tobacco/Vapping) or drink Alcohol 24 hours prior to your procedure If you use a CPAP at night, you may bring all equipment for your overnight stay.   Contacts, glasses, dentures or bridgework may not be worn into surgery.      For patients admitted to the hospital, discharge time will be determined by your treatment team.   Patients discharged the day of surgery will not be allowed to drive home, and someone needs to stay with them for 24 hours.    Special instructions:   [place]- Preparing For Surgery  Before surgery, you can play an important role. Because skin is not sterile, your skin needs to be as free of germs as possible. You can reduce the number of germs on your skin by washing with CHG  (chlorahexidine gluconate) Soap before surgery.  CHG is an antiseptic cleaner which kills germs and bonds with the skin to continue killing germs even after washing.    Oral Hygiene is also important to reduce your risk of infection.  Remember - BRUSH YOUR TEETH THE MORNING OF SURGERY WITH YOUR REGULAR TOOTHPASTE  Please do not use if you have an allergy to CHG or antibacterial soaps. If your skin becomes reddened/irritated stop using the CHG.  Do not shave (including legs and underarms) for at least 48 hours prior to first CHG shower. It is OK to shave your face.  Please follow these instructions carefully.   1. Shower the NIGHT BEFORE SURGERY and the MORNING OF SURGERY with CHG Soap.   2. If you chose to wash your hair, wash your hair first as usual with your normal shampoo.  3. After you shampoo, rinse your hair and body thoroughly to remove the shampoo.  4. Use CHG as you would any other liquid soap. You can apply CHG directly to the skin and wash gently with a scrungie or a clean washcloth.   5. Apply the CHG Soap to your body ONLY FROM THE NECK DOWN.  Do not use on open wounds or open sores. Avoid contact with your eyes, ears, mouth and genitals (private parts). Wash Face and genitals (private parts)  with your normal soap.   6. Wash thoroughly, paying special attention to the area where your surgery will be performed.  7. Thoroughly rinse your body with warm water from the neck down.  8. DO NOT shower/wash with your normal soap after using and rinsing off the CHG Soap.  9. Pat yourself dry with a CLEAN TOWEL.  10. Wear CLEAN PAJAMAS to bed the night before surgery, wear comfortable clothes the morning of surgery  11. Place CLEAN SHEETS on your bed the night of your first shower and DO NOT SLEEP WITH PETS.   Day of Surgery:   Do not apply any deodorants/lotions.  Please wear clean clothes to the hospital/surgery center.   Remember to brush your teeth WITH YOUR REGULAR  TOOTHPASTE.   Please read over the following fact sheets that you were given.

## 2020-04-30 ENCOUNTER — Other Ambulatory Visit: Payer: Self-pay

## 2020-04-30 ENCOUNTER — Encounter (HOSPITAL_COMMUNITY)
Admission: RE | Admit: 2020-04-30 | Discharge: 2020-04-30 | Disposition: A | Discharge status: Home / Self Care | Payer: Medicare Other | Source: Ambulatory Visit | Attending: Vascular Surgery | Admitting: Vascular Surgery

## 2020-04-30 ENCOUNTER — Other Ambulatory Visit (HOSPITAL_COMMUNITY)
Admission: RE | Admit: 2020-04-30 | Discharge: 2020-04-30 | Disposition: A | Discharge status: Home / Self Care | Payer: Medicare Other | Source: Ambulatory Visit | Attending: Vascular Surgery | Admitting: Vascular Surgery

## 2020-04-30 ENCOUNTER — Encounter (HOSPITAL_COMMUNITY): Payer: Self-pay

## 2020-04-30 DIAGNOSIS — Z01812 Encounter for preprocedural laboratory examination: Secondary | ICD-10-CM | POA: Insufficient documentation

## 2020-04-30 DIAGNOSIS — Z20822 Contact with and (suspected) exposure to covid-19: Secondary | ICD-10-CM | POA: Diagnosis not present

## 2020-04-30 HISTORY — DX: Personal history of other diseases of the digestive system: Z87.19

## 2020-04-30 LAB — URINALYSIS, ROUTINE W REFLEX MICROSCOPIC
Bilirubin Urine: NEGATIVE
Glucose, UA: NEGATIVE mg/dL
Hgb urine dipstick: NEGATIVE
Ketones, ur: NEGATIVE mg/dL
Leukocytes,Ua: NEGATIVE
Nitrite: NEGATIVE
Protein, ur: NEGATIVE mg/dL
Specific Gravity, Urine: 1.012 (ref 1.005–1.030)
pH: 6 (ref 5.0–8.0)

## 2020-04-30 LAB — CBC
HCT: 49.4 % (ref 39.0–52.0)
Hemoglobin: 15.7 g/dL (ref 13.0–17.0)
MCH: 28.1 pg (ref 26.0–34.0)
MCHC: 31.8 g/dL (ref 30.0–36.0)
MCV: 88.5 fL (ref 80.0–100.0)
Platelets: 119 10*3/uL — ABNORMAL LOW (ref 150–400)
RBC: 5.58 MIL/uL (ref 4.22–5.81)
RDW: 12.8 % (ref 11.5–15.5)
WBC: 7.7 10*3/uL (ref 4.0–10.5)
nRBC: 0 % (ref 0.0–0.2)

## 2020-04-30 LAB — COMPREHENSIVE METABOLIC PANEL
ALT: 22 U/L (ref 0–44)
AST: 25 U/L (ref 15–41)
Albumin: 4.1 g/dL (ref 3.5–5.0)
Alkaline Phosphatase: 50 U/L (ref 38–126)
Anion gap: 8 (ref 5–15)
BUN: 18 mg/dL (ref 8–23)
CO2: 28 mmol/L (ref 22–32)
Calcium: 9.5 mg/dL (ref 8.9–10.3)
Chloride: 105 mmol/L (ref 98–111)
Creatinine, Ser: 1.06 mg/dL (ref 0.61–1.24)
GFR calc Af Amer: >60 mL/min (ref >60)
GFR calc non Af Amer: >60 mL/min (ref >60)
Glucose, Bld: 114 mg/dL — ABNORMAL HIGH (ref 70–99)
Potassium: 4.3 mmol/L (ref 3.5–5.1)
Sodium: 141 mmol/L (ref 135–145)
Total Bilirubin: 1.2 mg/dL (ref 0.3–1.2)
Total Protein: 6.8 g/dL (ref 6.5–8.1)

## 2020-04-30 LAB — TYPE AND SCREEN
ABO/RH(D): A POS
Antibody Screen: NEGATIVE

## 2020-04-30 LAB — PROTIME-INR
INR: 1.1 (ref 0.8–1.2)
Prothrombin Time: 13.5 seconds (ref 11.4–15.2)

## 2020-04-30 LAB — SURGICAL PCR SCREEN
MRSA, PCR: NEGATIVE
Staphylococcus aureus: POSITIVE — AB

## 2020-04-30 LAB — APTT: aPTT: 29 seconds (ref 24–36)

## 2020-04-30 LAB — ABO/RH: ABO/RH(D): A POS

## 2020-04-30 LAB — SARS CORONAVIRUS 2 (TAT 6-24 HRS): SARS Coronavirus 2: NEGATIVE

## 2020-04-30 NOTE — Progress Notes (Signed)
Surgical PCR positive for Staph. Called pt and left VM to give him a heads up that it will be treated with Profend DOS.

## 2020-04-30 NOTE — Progress Notes (Signed)
Patient's HR is 42 during PAT appointment. Patient denies SHOB, dizziness, lightheadedness, denies CP, no acute distress noted.  Antionette Poles, PA-C aware. No interventions at this time.

## 2020-04-30 NOTE — Progress Notes (Signed)
PCP - Dr. Nehemiah Settle Cardiologist - Dr. Epifanio Lesches; formerly Dr. Armanda Magic  PPM/ICD - Denies  Chest x-ray - N/A EKG - [facility]/28/20 [facility] but not visible in [facility]; Records requested to fax or release EKG Tracing. Stress Test - Per patient, > 5 Years ago done possibly by Dr. Bjorn Pippin. Records requested ECHO - Per patient, > 5 Years ago done possibly by Dr. Bjorn Pippin. Records requested Cardiac Cath - Denies  Sleep Study - Denies  Patient denies being diabetic.  Blood Thinner Instructions: N/A Aspirin Instructions: N/A  ERAS Protcol - N/A PRE-SURGERY Ensure or G2- N/A  COVID TEST- 04/30/20   Anesthesia review: Yes, cardiac clearance; cardiac hx.  Patient denies shortness of breath, fever, cough and chest pain at PAT appointment   All instructions explained to the patient, with a verbal understanding of the material. Patient agrees to go over the instructions while at home for a better understanding. Patient also instructed to self quarantine after being tested for COVID-19. The opportunity to ask questions was provided.

## 2020-05-01 NOTE — Anesthesia Preprocedure Evaluation (Addendum)
Anesthesia Evaluation  Patient identified by MRN, date of birth, ID band Patient awake    Reviewed: Allergy & Precautions, NPO status , Patient's Chart, lab work & pertinent test results  Airway Mallampati: II  TM Distance: >3 FB Neck ROM: Full    Dental no notable dental hx.    Pulmonary neg pulmonary ROS,    Pulmonary exam normal breath sounds clear to auscultation       Cardiovascular hypertension, Normal cardiovascular exam Rhythm:Regular Rate:Normal  ECG: SB, rate 47  CT: Infrarenal abdominal aortic aneurysm measures 5.4 cm on the current CT, previously 5.0 cm, with mild atherosclerosis.   Neuro/Psych negative neurological ROS  negative psych ROS   GI/Hepatic Neg liver ROS, hiatal hernia, GERD  ,  Endo/Other  negative endocrine ROS  Renal/GU negative Renal ROS     Musculoskeletal negative musculoskeletal ROS (+)   Abdominal   Peds  Hematology Thrombocytopenia    Anesthesia Other Findings ABDOMINAL AORTIC ANEURYSM  Reproductive/Obstetrics                            Anesthesia Physical Anesthesia Plan  ASA: III  Anesthesia Plan: General   Post-op Pain Management:    Induction: Intravenous  PONV Risk Score and Plan: 2 and Ondansetron, Dexamethasone, Midazolam and Treatment may vary due to age or medical condition  Airway Management Planned: Oral ETT  Additional Equipment: Arterial line  Intra-op Plan:   Post-operative Plan: Extubation in OR  Informed Consent: I have reviewed the patients History and Physical, chart, labs and discussed the procedure including the risks, benefits and alternatives for the proposed anesthesia with the patient or authorized representative who has indicated his/her understanding and acceptance.     Dental advisory given  Plan Discussed with: CRNA  Anesthesia Plan Comments: (Reviewed PAT note written 05/01/2020 by Shonna Chock, PA-C. )       Anesthesia Quick Evaluation

## 2020-05-01 NOTE — Progress Notes (Signed)
Anesthesia Chart Review:  Case: [id number] Date/Time: 05/04/20 0715   Procedure: ABDOMINAL AORTIC ENDOVASCULAR STENT GRAFT (N/A )   Anesthesia type: General   Pre-op diagnosis: ABDOMINAL AORTIC ANEURYSM   Location: [facility] OR ROOM 70 / [place] [facility]   Surgeons: [name], MD      DISCUSSION: Patient is a 79 year old male scheduled for the above procedure.  History includes never smoker, HTN, HLD, GERD, hiatal hernia, bradycardia, dysrhythmia (frequent PACs), AAA, pheochromocytoma (s/p left adrenal pheochromocytoma resection 05/05/92, [facility]), right THA (09/08/17), massive BPH with urinary retention (s/p prostatectomy 05/15/13).   Patient seen by Dr. Gardiner Rhyme on 04/28/2020 for preoperative cardiac evaluation.  Patient reported being very active, going to the gym 6 days/week with exercises including weightlifting and elliptical for 25 to 30 minutes.  Resting heart rate around 50, and up to 120 with exercise.  He also plays golf 1-2 times per week.  He denied CV symptoms. Per Dr. Gardiner Rhyme, "RCRI score 1 given vascular surgery, corresponding to 6% 30-day risk of MI, cardiac arrest, or death.  No further cardiac work-up recommended prior to his surgery."  He notes patient asymptomatic from frequent PACs and sinus bradycardia but would consider cardiac monitor in the future should he develop symptoms.  Patient's HR 42 bpm at PAT. Known bradycardia history. He denied SOB, dizziness, lightheadedness, chest pain.   He received second [place] COVID-19 vaccine on 12/24/2019.  Preoperative COVID-19 test - 04/30/2020.  Anesthesia team to evaluate on the day of surgery.   VS: BP (!) 167/89   Pulse (!) 42   Temp 36.8 C (Oral)   Resp 18   Ht 6' (1.829 m)   Wt 80 kg   SpO2 99%   BMI 23.92 kg/m   PROVIDERS: Donato Heinz, MD is cardiologist (previously Fransico Him, MD) [name], MD is PCP [facility])   LABS: Labs reviewed: Acceptable for surgery.  Platelet count 119K, down from 145K on 09/09/17  (currently no recent comparison labs).  (all labs ordered are listed, but only abnormal results are displayed)  Labs Reviewed  SURGICAL PCR SCREEN - Abnormal; Notable for the following components:      Result Value   Staphylococcus aureus POSITIVE (*)    All other components within normal limits  CBC - Abnormal; Notable for the following components:   Platelets 119 (*)    All other components within normal limits  COMPREHENSIVE METABOLIC PANEL - Abnormal; Notable for the following components:   Glucose, Bld 114 (*)    All other components within normal limits  APTT  PROTIME-INR  URINALYSIS, ROUTINE W REFLEX MICROSCOPIC  TYPE AND SCREEN  ABO/RH     IMAGES: CTA Chest, Ao+BiFem 04/07/20: IMPRESSION: Infrarenal abdominal aortic aneurysm measures 5.4 cm on the current CT, previously 5.0 cm, with mild atherosclerosis. Aortic aneurysm NOS (ICD10-I71.9). Aortic Atherosclerosis (ICD10-I70.0).  Redemonstration bilateral CIA aneurysm, estimated 2.4 cm on the right and 2.5 cm on the left.  Redemonstration of ectasia/aneurysm of the bilateral hypogastric arteries, 18 mm on the right and 25 mm on the left.  Uniform ectasia of the bilateral femoropopliteal segments, with right popliteal artery estimated 10 mm and the left popliteal artery estimated 11 mm. Minimal atherosclerotic changes without high-grade stenosis or occlusion.  Ascending aorta measures 39 mm.   EKG: 04/28/20 ([facility]): Sinus bradycardia with premature atrial complexes. Septal infarct (age undetermined).    CV: Echo 03/22/12 [facility]; scanned under Media tab, Correspondence, 05/15/13): Conclusions: 1. Normal 2D, M-mode, color Doppler echo. 2. Left ventricular  ejection fraction estimated by 2D at 57.3%. 3. Trace mitral valve regurgitation. 4. The aortic valve is sclerotic but opens well. 5. There is trivial anterior pericardial effusion.  He also had a stress test at [facility] in 2013, but report not  currently available.    Past Medical History:  Diagnosis Date  . AAA (abdominal aortic aneurysm) (HCC)    last u/s done 07/18/17   . Arthritis   . Bradycardia   . Dysrhythmia    frequent PAC, for 20 years  . GERD (gastroesophageal reflux disease)    occ, OTC  . Hemorrhoids   . History of hiatal hernia   . Hyperlipidemia   . Hypertension   . Seasonal allergies     Past Surgical History:  Procedure Laterality Date  . CATARACT EXTRACTION Bilateral 2009  . DIAGNOSTIC LAPAROSCOPY     laparoscopic hernia repair  . EYE SURGERY Bilateral    cataract removal  . HERNIA REPAIR Bilateral 1999, 2006  . JOINT REPLACEMENT Right ~2018   hip replacement  . pheochromocytoma  1993  . PROSTATECTOMY N/A 05/15/2013   Procedure: PROSTATECTOMY RETROPUBIC; SIMPLE OPEN PROSTATECTOMY;  Surgeon: Valetta Fuller, MD;  Location: [facility] ORS;  Service: Urology;  Laterality: N/A;  . TOTAL ELBOW REPLACEMENT Left    > 30 years ago  . TOTAL HIP ARTHROPLASTY Right 09/08/2017   Procedure: RIGHT TOTAL HIP ARTHROPLASTY ANTERIOR APPROACH;  Surgeon: Kathryne Hitch, MD;  Location: [facility] ORS;  Service: Orthopedics;  Laterality: Right;    MEDICATIONS: . fluocinonide cream (LIDEX) 0.05 %  . ibuprofen (ADVIL,MOTRIN) 200 MG tablet   No current facility-administered medications for this encounter.     Shonna Chock, PA-C Surgical Short Stay/Anesthesiology [facility] Phone [phone] [facility] Phone [phone] 05/01/2020 11:36 AM

## 2020-05-04 ENCOUNTER — Inpatient Hospital Stay (HOSPITAL_COMMUNITY): Payer: Medicare Other | Admitting: Physician Assistant

## 2020-05-04 ENCOUNTER — Inpatient Hospital Stay (HOSPITAL_COMMUNITY)
Admission: RE | Admit: 2020-05-04 | Discharge: 2020-05-05 | DRG: 269 | Disposition: A | Discharge status: Home / Self Care | Payer: Medicare Other | Attending: Vascular Surgery | Admitting: Vascular Surgery

## 2020-05-04 ENCOUNTER — Encounter (HOSPITAL_COMMUNITY)
Admission: RE | Disposition: A | Discharge status: Home / Self Care | Payer: Self-pay | Source: Home / Self Care | Attending: Vascular Surgery

## 2020-05-04 ENCOUNTER — Inpatient Hospital Stay (HOSPITAL_COMMUNITY): Payer: Medicare Other

## 2020-05-04 ENCOUNTER — Other Ambulatory Visit: Payer: Self-pay

## 2020-05-04 ENCOUNTER — Inpatient Hospital Stay (HOSPITAL_COMMUNITY): Payer: Medicare Other | Admitting: Certified Registered Nurse Anesthetist

## 2020-05-04 ENCOUNTER — Encounter (HOSPITAL_COMMUNITY): Payer: Self-pay | Admitting: Vascular Surgery

## 2020-05-04 DIAGNOSIS — I700 Atherosclerosis of aorta: Secondary | ICD-10-CM | POA: Diagnosis not present

## 2020-05-04 DIAGNOSIS — K449 Diaphragmatic hernia without obstruction or gangrene: Secondary | ICD-10-CM | POA: Diagnosis not present

## 2020-05-04 DIAGNOSIS — J984 Other disorders of lung: Secondary | ICD-10-CM | POA: Diagnosis not present

## 2020-05-04 DIAGNOSIS — J302 Other seasonal allergic rhinitis: Secondary | ICD-10-CM | POA: Diagnosis not present

## 2020-05-04 DIAGNOSIS — I714 Abdominal aortic aneurysm, without rupture, unspecified: Secondary | ICD-10-CM | POA: Diagnosis present

## 2020-05-04 DIAGNOSIS — E785 Hyperlipidemia, unspecified: Secondary | ICD-10-CM | POA: Diagnosis not present

## 2020-05-04 DIAGNOSIS — I723 Aneurysm of iliac artery: Secondary | ICD-10-CM | POA: Diagnosis present

## 2020-05-04 DIAGNOSIS — D62 Acute posthemorrhagic anemia: Secondary | ICD-10-CM | POA: Diagnosis not present

## 2020-05-04 DIAGNOSIS — D696 Thrombocytopenia, unspecified: Secondary | ICD-10-CM | POA: Diagnosis present

## 2020-05-04 DIAGNOSIS — N401 Enlarged prostate with lower urinary tract symptoms: Secondary | ICD-10-CM | POA: Diagnosis not present

## 2020-05-04 DIAGNOSIS — I10 Essential (primary) hypertension: Secondary | ICD-10-CM | POA: Diagnosis present

## 2020-05-04 DIAGNOSIS — Z7289 Other problems related to lifestyle: Secondary | ICD-10-CM | POA: Diagnosis not present

## 2020-05-04 DIAGNOSIS — Z96641 Presence of right artificial hip joint: Secondary | ICD-10-CM | POA: Diagnosis not present

## 2020-05-04 DIAGNOSIS — K219 Gastro-esophageal reflux disease without esophagitis: Secondary | ICD-10-CM | POA: Diagnosis present

## 2020-05-04 DIAGNOSIS — N138 Other obstructive and reflux uropathy: Secondary | ICD-10-CM | POA: Diagnosis not present

## 2020-05-04 DIAGNOSIS — Z8249 Family history of ischemic heart disease and other diseases of the circulatory system: Secondary | ICD-10-CM | POA: Diagnosis not present

## 2020-05-04 HISTORY — PX: ULTRASOUND GUIDANCE FOR VASCULAR ACCESS: SHX6516

## 2020-05-04 HISTORY — PX: ABDOMINAL AORTIC ENDOVASCULAR STENT GRAFT: SHX5707

## 2020-05-04 LAB — PROTIME-INR
INR: 1.1 (ref 0.8–1.2)
Prothrombin Time: 14.2 seconds (ref 11.4–15.2)

## 2020-05-04 LAB — BASIC METABOLIC PANEL
Anion gap: 7 (ref 5–15)
BUN: 17 mg/dL (ref 8–23)
CO2: 24 mmol/L (ref 22–32)
Calcium: 8.8 mg/dL — ABNORMAL LOW (ref 8.9–10.3)
Chloride: 110 mmol/L (ref 98–111)
Creatinine, Ser: 1.17 mg/dL (ref 0.61–1.24)
GFR calc Af Amer: >60 mL/min (ref >60)
GFR calc non Af Amer: >60 mL/min (ref >60)
Glucose, Bld: 134 mg/dL — ABNORMAL HIGH (ref 70–99)
Potassium: 4.3 mmol/L (ref 3.5–5.1)
Sodium: 141 mmol/L (ref 135–145)

## 2020-05-04 LAB — MAGNESIUM: Magnesium: 2.2 mg/dL (ref 1.7–2.4)

## 2020-05-04 LAB — CBC
HCT: 45.3 % (ref 39.0–52.0)
Hemoglobin: 14.5 g/dL (ref 13.0–17.0)
MCH: 28.1 pg (ref 26.0–34.0)
MCHC: 32 g/dL (ref 30.0–36.0)
MCV: 87.8 fL (ref 80.0–100.0)
Platelets: 98 10*3/uL — ABNORMAL LOW (ref 150–400)
RBC: 5.16 MIL/uL (ref 4.22–5.81)
RDW: 12.7 % (ref 11.5–15.5)
WBC: 7.6 10*3/uL (ref 4.0–10.5)
nRBC: 0 % (ref 0.0–0.2)

## 2020-05-04 LAB — APTT: aPTT: 33 seconds (ref 24–36)

## 2020-05-04 SURGERY — INSERTION, ENDOVASCULAR STENT GRAFT, AORTA, ABDOMINAL
Anesthesia: General | Site: Groin

## 2020-05-04 MED ORDER — HEPARIN SODIUM (PORCINE) 5000 UNIT/ML IJ SOLN: 5000.0000 [IU] | Freq: Three times a day (TID) | INTRAMUSCULAR | Status: DC

## 2020-05-04 MED ORDER — HEPARIN SODIUM (PORCINE) 1000 UNIT/ML IJ SOLN
INTRAMUSCULAR | Status: DC | PRN
  Administered 2020-05-04: 7000 [IU] via INTRAVENOUS

## 2020-05-04 MED ORDER — FENTANYL CITRATE (PF) 100 MCG/2ML IJ SOLN: 25.0000 ug | INTRAMUSCULAR | Status: DC | PRN

## 2020-05-04 MED ORDER — PHENYLEPHRINE 40 MCG/ML (10ML) SYRINGE FOR IV PUSH (FOR BLOOD PRESSURE SUPPORT)
PREFILLED_SYRINGE | INTRAVENOUS | Status: AC
  Filled 2020-05-04: qty 10

## 2020-05-04 MED ORDER — GLYCOPYRROLATE PF 0.2 MG/ML IJ SOSY
PREFILLED_SYRINGE | INTRAMUSCULAR | Status: DC | PRN
  Administered 2020-05-04: .2 mg via INTRAVENOUS

## 2020-05-04 MED ORDER — DEXAMETHASONE SODIUM PHOSPHATE 10 MG/ML IJ SOLN
INTRAMUSCULAR | Status: AC
  Filled 2020-05-04: qty 1

## 2020-05-04 MED ORDER — LACTATED RINGERS IV SOLN: INTRAVENOUS | Status: DC | PRN

## 2020-05-04 MED ORDER — SODIUM CHLORIDE 0.9 % IV SOLN: INTRAVENOUS | Status: DC

## 2020-05-04 MED ORDER — IODIXANOL 320 MG/ML IV SOLN
INTRAVENOUS | Status: DC | PRN
  Administered 2020-05-04: 40 mL via INTRA_ARTERIAL

## 2020-05-04 MED ORDER — EPHEDRINE 5 MG/ML INJ
INTRAVENOUS | Status: AC
  Filled 2020-05-04: qty 10

## 2020-05-04 MED ORDER — ROCURONIUM BROMIDE 10 MG/ML (PF) SYRINGE
PREFILLED_SYRINGE | INTRAVENOUS | Status: AC
  Filled 2020-05-04: qty 10

## 2020-05-04 MED ORDER — SODIUM CHLORIDE 0.9 % IV SOLN: 500.0000 mL | Freq: Once | INTRAVENOUS | Status: DC | PRN

## 2020-05-04 MED ORDER — PROPOFOL 10 MG/ML IV BOLUS
INTRAVENOUS | Status: AC
  Filled 2020-05-04: qty 40

## 2020-05-04 MED ORDER — LIDOCAINE 2% (20 MG/ML) 5 ML SYRINGE
INTRAMUSCULAR | Status: AC
  Filled 2020-05-04: qty 5

## 2020-05-04 MED ORDER — CEFAZOLIN SODIUM-DEXTROSE 2-4 GM/100ML-% IV SOLN
2.0000 g | INTRAVENOUS | Status: AC
  Administered 2020-05-04: 2 g via INTRAVENOUS
  Filled 2020-05-04: qty 100

## 2020-05-04 MED ORDER — ACETAMINOPHEN 500 MG PO TABS
1000.0000 mg | ORAL_TABLET | Freq: Once | ORAL | Status: AC
  Administered 2020-05-04: 1000 mg via ORAL
  Filled 2020-05-04: qty 2

## 2020-05-04 MED ORDER — LABETALOL HCL 5 MG/ML IV SOLN: 10.0000 mg | INTRAVENOUS | Status: DC | PRN

## 2020-05-04 MED ORDER — MAGNESIUM SULFATE 2 GM/50ML IV SOLN: 2.0000 g | Freq: Every day | INTRAVENOUS | Status: DC | PRN

## 2020-05-04 MED ORDER — ONDANSETRON HCL 4 MG/2ML IJ SOLN
INTRAMUSCULAR | Status: AC
  Filled 2020-05-04: qty 2

## 2020-05-04 MED ORDER — PHENYLEPHRINE 40 MCG/ML (10ML) SYRINGE FOR IV PUSH (FOR BLOOD PRESSURE SUPPORT)
PREFILLED_SYRINGE | INTRAVENOUS | Status: DC | PRN
  Administered 2020-05-04: 40 ug via INTRAVENOUS

## 2020-05-04 MED ORDER — ACETAMINOPHEN 325 MG PO TABS: 325.0000 mg | ORAL_TABLET | ORAL | Status: DC | PRN

## 2020-05-04 MED ORDER — METOPROLOL TARTRATE 5 MG/5ML IV SOLN: 2.0000 mg | INTRAVENOUS | Status: DC | PRN

## 2020-05-04 MED ORDER — CHLORHEXIDINE GLUCONATE CLOTH 2 % EX PADS: 6.0000 | MEDICATED_PAD | Freq: Once | CUTANEOUS | Status: DC

## 2020-05-04 MED ORDER — ONDANSETRON HCL 4 MG/2ML IJ SOLN
INTRAMUSCULAR | Status: DC | PRN
  Administered 2020-05-04: 4 mg via INTRAVENOUS

## 2020-05-04 MED ORDER — PHENOL 1.4 % MT LIQD: 1.0000 | OROMUCOSAL | Status: DC | PRN

## 2020-05-04 MED ORDER — DOCUSATE SODIUM 100 MG PO CAPS
100.0000 mg | ORAL_CAPSULE | Freq: Every day | ORAL | Status: DC
  Administered 2020-05-05: 100 mg via ORAL
  Filled 2020-05-04: qty 1

## 2020-05-04 MED ORDER — CEFAZOLIN SODIUM-DEXTROSE 2-4 GM/100ML-% IV SOLN
2.0000 g | Freq: Three times a day (TID) | INTRAVENOUS | Status: AC
  Administered 2020-05-04 – 2020-05-05 (×2): 2 g via INTRAVENOUS
  Filled 2020-05-04 (×2): qty 100

## 2020-05-04 MED ORDER — GUAIFENESIN-DM 100-10 MG/5ML PO SYRP: 15.0000 mL | ORAL_SOLUTION | ORAL | Status: DC | PRN

## 2020-05-04 MED ORDER — SUGAMMADEX SODIUM 200 MG/2ML IV SOLN
INTRAVENOUS | Status: DC | PRN
  Administered 2020-05-04: 200 mg via INTRAVENOUS

## 2020-05-04 MED ORDER — FENTANYL CITRATE (PF) 250 MCG/5ML IJ SOLN
INTRAMUSCULAR | Status: AC
  Filled 2020-05-04: qty 5

## 2020-05-04 MED ORDER — MIDAZOLAM HCL 2 MG/2ML IJ SOLN
INTRAMUSCULAR | Status: AC
  Filled 2020-05-04: qty 2

## 2020-05-04 MED ORDER — DEXAMETHASONE SODIUM PHOSPHATE 10 MG/ML IJ SOLN
INTRAMUSCULAR | Status: DC | PRN
  Administered 2020-05-04: 4 mg via INTRAVENOUS

## 2020-05-04 MED ORDER — MIDAZOLAM HCL 5 MG/5ML IJ SOLN
INTRAMUSCULAR | Status: DC | PRN
  Administered 2020-05-04: 1 mg via INTRAVENOUS

## 2020-05-04 MED ORDER — OXYCODONE-ACETAMINOPHEN 5-325 MG PO TABS: 1.0000 | ORAL_TABLET | ORAL | Status: DC | PRN

## 2020-05-04 MED ORDER — POTASSIUM CHLORIDE CRYS ER 20 MEQ PO TBCR: 20.0000 meq | EXTENDED_RELEASE_TABLET | Freq: Every day | ORAL | Status: DC | PRN

## 2020-05-04 MED ORDER — MORPHINE SULFATE (PF) 2 MG/ML IV SOLN: 2.0000 mg | INTRAVENOUS | Status: DC | PRN

## 2020-05-04 MED ORDER — ALUM & MAG HYDROXIDE-SIMETH 200-200-20 MG/5ML PO SUSP: 15.0000 mL | ORAL | Status: DC | PRN

## 2020-05-04 MED ORDER — HYDRALAZINE HCL 20 MG/ML IJ SOLN: 5.0000 mg | INTRAMUSCULAR | Status: DC | PRN

## 2020-05-04 MED ORDER — PROTAMINE SULFATE 10 MG/ML IV SOLN
INTRAVENOUS | Status: DC | PRN
  Administered 2020-05-04: 50 mg via INTRAVENOUS

## 2020-05-04 MED ORDER — ROCURONIUM BROMIDE 10 MG/ML (PF) SYRINGE
PREFILLED_SYRINGE | INTRAVENOUS | Status: DC | PRN
  Administered 2020-05-04: 20 mg via INTRAVENOUS
  Administered 2020-05-04: 60 mg via INTRAVENOUS
  Administered 2020-05-04: 40 mg via INTRAVENOUS
  Administered 2020-05-04: 30 mg via INTRAVENOUS

## 2020-05-04 MED ORDER — PANTOPRAZOLE SODIUM 40 MG PO TBEC
40.0000 mg | DELAYED_RELEASE_TABLET | Freq: Every day | ORAL | Status: DC
  Administered 2020-05-05: 40 mg via ORAL
  Filled 2020-05-04: qty 1

## 2020-05-04 MED ORDER — PROPOFOL 10 MG/ML IV BOLUS
INTRAVENOUS | Status: DC | PRN
  Administered 2020-05-04: 200 mg via INTRAVENOUS

## 2020-05-04 MED ORDER — 0.9 % SODIUM CHLORIDE (POUR BTL) OPTIME
TOPICAL | Status: DC | PRN
  Administered 2020-05-04: 1000 mL

## 2020-05-04 MED ORDER — PHENYLEPHRINE HCL-NACL 10-0.9 MG/250ML-% IV SOLN
INTRAVENOUS | Status: DC | PRN
  Administered 2020-05-04: 15 ug/min via INTRAVENOUS

## 2020-05-04 MED ORDER — CHLORHEXIDINE GLUCONATE 0.12 % MT SOLN
15.0000 mL | Freq: Once | OROMUCOSAL | Status: AC
  Administered 2020-05-04: 15 mL via OROMUCOSAL
  Filled 2020-05-04: qty 15

## 2020-05-04 MED ORDER — LIDOCAINE 2% (20 MG/ML) 5 ML SYRINGE
INTRAMUSCULAR | Status: DC | PRN
  Administered 2020-05-04: 60 mg via INTRAVENOUS

## 2020-05-04 MED ORDER — CHLORHEXIDINE GLUCONATE CLOTH 2 % EX PADS: 6.0000 | MEDICATED_PAD | Freq: Every day | CUTANEOUS | Status: DC

## 2020-05-04 MED ORDER — ASPIRIN EC 81 MG PO TBEC
81.0000 mg | DELAYED_RELEASE_TABLET | Freq: Every day | ORAL | Status: DC
  Administered 2020-05-05: 81 mg via ORAL
  Filled 2020-05-04: qty 1

## 2020-05-04 MED ORDER — EPHEDRINE SULFATE-NACL 50-0.9 MG/10ML-% IV SOSY
PREFILLED_SYRINGE | INTRAVENOUS | Status: DC | PRN
  Administered 2020-05-04 (×2): 5 mg via INTRAVENOUS

## 2020-05-04 MED ORDER — HEPARIN SODIUM (PORCINE) 1000 UNIT/ML IJ SOLN
INTRAMUSCULAR | Status: AC
  Filled 2020-05-04: qty 1

## 2020-05-04 MED ORDER — SODIUM CHLORIDE 0.9 % IV SOLN
INTRAVENOUS | Status: DC | PRN
  Administered 2020-05-04: 500 mL

## 2020-05-04 MED ORDER — ONDANSETRON HCL 4 MG/2ML IJ SOLN: 4.0000 mg | Freq: Once | INTRAMUSCULAR | Status: DC | PRN

## 2020-05-04 MED ORDER — ORAL CARE MOUTH RINSE: 15.0000 mL | Freq: Once | OROMUCOSAL | Status: AC

## 2020-05-04 MED ORDER — SIMVASTATIN 20 MG PO TABS: 20.0000 mg | ORAL_TABLET | Freq: Every day | ORAL | Status: DC

## 2020-05-04 MED ORDER — SODIUM CHLORIDE 0.9 % IV SOLN
INTRAVENOUS | Status: AC
  Filled 2020-05-04: qty 1.2

## 2020-05-04 MED ORDER — FENTANYL CITRATE (PF) 250 MCG/5ML IJ SOLN
INTRAMUSCULAR | Status: DC | PRN
  Administered 2020-05-04 (×2): 50 ug via INTRAVENOUS

## 2020-05-04 MED ORDER — ONDANSETRON HCL 4 MG/2ML IJ SOLN: 4.0000 mg | Freq: Four times a day (QID) | INTRAMUSCULAR | Status: DC | PRN

## 2020-05-04 MED ORDER — ACETAMINOPHEN 650 MG RE SUPP: 325.0000 mg | RECTAL | Status: DC | PRN

## 2020-05-04 SURGICAL SUPPLY — 51 items
CANISTER SUCT 3000ML PPV (MISCELLANEOUS) ×3 IMPLANT
CATH BEACON 5 .035 65 KMP TIP (CATHETERS) ×3 IMPLANT
CATH BEACON 5.038 65CM KMP-01 (CATHETERS) ×3 IMPLANT
CATH OMNI FLUSH .035X70CM (CATHETERS) ×3 IMPLANT
CLIP LIGATING EXTRA MED SLVR (CLIP) IMPLANT
CLIP LIGATING EXTRA SM BLUE (MISCELLANEOUS) IMPLANT
COVER PROBE W GEL 5X96 (DRAPES) ×3 IMPLANT
COVER WAND RF STERILE (DRAPES) ×3 IMPLANT
DERMABOND ADVANCED (GAUZE/BANDAGES/DRESSINGS) ×1
DERMABOND ADVANCED .7 DNX12 (GAUZE/BANDAGES/DRESSINGS) ×2 IMPLANT
DEVICE CLOSURE PERCLS PRGLD 6F (VASCULAR PRODUCTS) ×8 IMPLANT
DRSG TEGADERM 2-3/8X2-3/4 SM (GAUZE/BANDAGES/DRESSINGS) ×6 IMPLANT
DRYSEAL FLEXSHEATH 16FR 33CM (SHEATH) ×2
ELECT REM PT RETURN 9FT ADLT (ELECTROSURGICAL) ×6
ELECTRODE REM PT RTRN 9FT ADLT (ELECTROSURGICAL) ×4 IMPLANT
EXCLUDER TNK 26X14.5MMX12CM (Endovascular Graft) ×2 IMPLANT
EXCLUDER TRUNK 26X14.5MMX12CM (Endovascular Graft) ×3 IMPLANT
GLOVE SS BIOGEL STRL SZ 7.5 (GLOVE) ×8 IMPLANT
GLOVE SUPERSENSE BIOGEL SZ 7.5 (GLOVE) ×4
GOWN STRL REUS W/ TWL LRG LVL3 (GOWN DISPOSABLE) ×6 IMPLANT
GOWN STRL REUS W/TWL LRG LVL3 (GOWN DISPOSABLE) ×9
GRAFT BALLN CATH 65CM (STENTS) ×2 IMPLANT
KIT BASIN OR (CUSTOM PROCEDURE TRAY) ×3 IMPLANT
KIT TURNOVER KIT B (KITS) ×3 IMPLANT
LEG CONTRALATERAL 27X10 (Vascular Products) ×3 IMPLANT
LEG CONTRALATERAL 27X12 (Vascular Products) ×3 IMPLANT
NEEDLE PERC 18GX7CM (NEEDLE) ×3 IMPLANT
NS IRRIG 1000ML POUR BTL (IV SOLUTION) ×3 IMPLANT
PACK ENDOVASCULAR (PACKS) ×3 IMPLANT
PAD ARMBOARD 7.5X6 YLW CONV (MISCELLANEOUS) ×6 IMPLANT
PERCLOSE PROGLIDE 6F (VASCULAR PRODUCTS) ×12
SHEATH BRITE TIP 8FR 23CM (SHEATH) ×3 IMPLANT
SHEATH BRITE TIP 8FR 35CM (SHEATH) ×3 IMPLANT
SHEATH DRYSEAL FLEX 16FR 33CM (SHEATH) ×4 IMPLANT
SHEATH PINNACLE 8F 10CM (SHEATH) ×3 IMPLANT
STENT GRAFT BALLN CATH 65CM (STENTS) ×3
STOPCOCK MORSE 400PSI 3WAY (MISCELLANEOUS) ×3 IMPLANT
SUT ETHILON 3 0 PS 1 (SUTURE) IMPLANT
SUT PROLENE 5 0 C 1 24 (SUTURE) IMPLANT
SUT VIC AB 2-0 CTX 36 (SUTURE) IMPLANT
SUT VIC AB 3-0 SH 18 (SUTURE) ×3 IMPLANT
SUT VIC AB 3-0 SH 27 (SUTURE)
SUT VIC AB 3-0 SH 27X BRD (SUTURE) IMPLANT
SUT VICRYL 4-0 PS2 18IN ABS (SUTURE) ×3 IMPLANT
SYR 20ML LL LF (SYRINGE) ×3 IMPLANT
TOWEL GREEN STERILE (TOWEL DISPOSABLE) ×3 IMPLANT
TRAY FOLEY MTR SLVR 16FR STAT (SET/KITS/TRAYS/PACK) ×3 IMPLANT
TUBING HIGH PRESSURE 120CM (CONNECTOR) ×3 IMPLANT
TUBING INJECTOR 48 (MISCELLANEOUS) ×3 IMPLANT
WIRE AMPLATZ SS-J .035X180CM (WIRE) ×6 IMPLANT
WIRE BENTSON .035X145CM (WIRE) ×6 IMPLANT

## 2020-05-04 NOTE — Anesthesia Postprocedure Evaluation (Signed)
Anesthesia Post Note  Patient: James Estrada  Procedure(s) Performed: ABDOMINAL AORTIC ENDOVASCULAR STENT GRAFT (N/A ) ULTRASOUND GUIDANCE FOR VASCULAR ACCESS (Bilateral Groin)     Patient location during evaluation: PACU Anesthesia Type: General Level of consciousness: awake and alert Pain management: pain level controlled Vital Signs Assessment: post-procedure vital signs reviewed and stable Respiratory status: spontaneous breathing, nonlabored ventilation, respiratory function stable and patient connected to nasal cannula oxygen Cardiovascular status: blood pressure returned to baseline and stable Postop Assessment: no apparent nausea or vomiting Anesthetic complications: no   No complications documented.  Last Vitals:  Vitals:   05/04/20 1513 05/04/20 1603  BP: 107/83 125/81  Pulse: 83 60  Resp: 20 18  Temp: 36.7 C 37.2 C  SpO2: 96% 97%    Last Pain:  Vitals:   05/04/20 1603  TempSrc: Oral  PainSc:                  Ashford Clouse P Pesach Frisch

## 2020-05-04 NOTE — Progress Notes (Signed)
Dr. Bradley Ferris made aware patient's BP elevated upon arrival to short stay.  No new orders received.

## 2020-05-04 NOTE — Op Note (Signed)
OPERATIVE REPORT  DATE OF SURGERY: 05/04/2020  PATIENT: James Estrada, 79 y.o. male MRN: [id number]  DOB: 03-17-1941  PRE-OPERATIVE DIAGNOSIS: Abdominal aortic aneurysm  POST-OPERATIVE DIAGNOSIS:  Same  PROCEDURE: Gore stent graft repair abdominal aortic aneurysm  SURGEON:  Gretta Began, M.D.  PHYSICIAN ASSISTANT: Dr. Sherald Hess  ANESTHESIA: General  EBL: per anesthesia record  Total I/O In: 1300 [I.V.:1200; IV Piggyback:100] Out: 1065 [Urine:965; Blood:100]  BLOOD ADMINISTERED: none  DRAINS: none  SPECIMEN: none  COUNTS CORRECT:  YES  PATIENT DISPOSITION:  PACU - hemodynamically stable  PROCEDURE DETAILS: Patient was taken to the operating placed supine position where the area of the abdomen both groins were prepped and draped in usual sterile fashion.  Using SonoSite ultrasound and a singlewall puncture the common femoral arteries were accessed bilaterally with 18-gauge needle and a Bentson wire was passed centrally and this was confirmed with fluoroscopy.  2 separate Perclose was were placed at the 10:00 and 2 o'clock position and partially deployed.  A 8 French sheath was passed over the guidewire.  Kumpe catheters were used to direct the Bentson wire to the suprarenal aorta.  The Bentson wires were exchanged through the catheter with Amplatz superstiff wires.  The patient was given 6000 units of intravenous heparin and 16 French sheaths were placed bilaterally.  The main body was positioned through the right groin and was placed in a cross leg position.  The main body was a 26 x 14 x 12 cm device.  This was positioned at the level of the renal arteries.  A pigtail catheter was positioned through the left sheath and arteriogram was obtained revealing the level of the renal arteries.  The left renal artery was slightly lower than the right renal artery.  The main body was deployed landing just below the left renal artery and this was confirmed with repeat  arteriogram.  Next the contralateral gate was cannulated by directing a Bentson wire with a Kumpe catheter into the contralateral gate.  The Kumpe catheter was exchanged for a pigtail catheter and this was rotated within the main body confirming that this was intragraft.  The Amplatz wire was then placed back again through the left groin through the contralateral gate.  Hand-injection through the left femoral sheath revealed a takeoff of the hypogastric artery.  A flared device which was a 27 x 12 cm device was positioned to land just above the hypogastric artery takeoff and was deployed.  Finally the flared limb on the right was positioned through the right sheath.  Again a retrograde injection through the right sheath revealed the level of the hypogastric artery takeoff.  The 27 x 10 cm right extension limb was deployed.  A MO B balloon was then used to dilate the proximal and distal attachment sites and all junctions.  A pigtail catheter again was positioned above the level of the renal arteries and a completion arteriogram revealed excellent positioning of the graft with no evidence of attachment area endoleak.  There was apparent blush with a late type II to the left of the stent graft and the aneurysm sac.  The sheaths were then removed and the prior placed Perclose devices were secured.  This gave good hemostasis and the guidewires were removed.  Patient was given 50 mg of protamine to reverse the heparin.  The wounds were closed with 4 oh in the stab incision for each groin.  Patient had easily palpable dorsalis pedis pulses bilaterally and was transferred to  the recovery room in stable condition   [name], M.D., [facility] 05/04/2020 12:39 PM

## 2020-05-04 NOTE — Interval H&P Note (Signed)
History and Physical Interval Note:  05/04/2020 7:20 AM  James Estrada  has presented today for surgery, with the diagnosis of ABDOMINAL AORTIC ANEURYSM.  The various methods of treatment have been discussed with the patient and family. After consideration of risks, benefits and other options for treatment, the patient has consented to  Procedure(s): ABDOMINAL AORTIC ENDOVASCULAR STENT GRAFT (N/A) as a surgical intervention.  The patient's history has been reviewed, patient examined, no change in status, stable for surgery.  I have reviewed the patient's chart and labs.  Questions were answered to the patient's satisfaction.     Gretta Began

## 2020-05-04 NOTE — Anesthesia Procedure Notes (Signed)
Arterial Line Insertion Start/End6/21/2021 7:10 AM, 05/04/2020 7:15 AM Performed by: Waynard Edwards, CRNA, CRNA  Patient location: Pre-op. Preanesthetic checklist: patient identified, IV checked, site marked, risks and benefits discussed, surgical consent, monitors and equipment checked, pre-op evaluation, timeout performed and anesthesia consent Lidocaine 1% used for infiltration Right, radial was placed Catheter size: 20 G Hand hygiene performed , maximum sterile barriers used  and Seldinger technique used Allen's test indicative of satisfactory collateral circulation Attempts: 1 Procedure performed without using ultrasound guided technique. Following insertion, dressing applied and Biopatch. Post procedure assessment: normal and unchanged  Patient tolerated the procedure well with no immediate complications.

## 2020-05-04 NOTE — Progress Notes (Signed)
  Day of Surgery Note    Subjective:  No complaints   Vitals:   05/04/20 1117 05/04/20 1147  BP: 131/75 114/84  Pulse: 92 62  Resp: 15 18  Temp:  (!) 97 F (36.1 C)  SpO2: 96% 98%    Incisions:   Bilateral groins soft without hematoma Extremities:  + palpable DP pulses bilaterally Cardiac:  regular Lungs:  Non labored    Assessment/Plan:  This is a 79 y.o. male who is s/p  EVAR  -pt doing well with palpable DP pulses bilaterally -groins are soft without hematoma -pt's questions answered -hopeful for 4 east bed soon -if uneventful night, anticipate dc tomorrow.   Doreatha Massed, PA-C 05/04/2020 12:51 PM [phone]

## 2020-05-04 NOTE — Transfer of Care (Signed)
Immediate Anesthesia Transfer of Care Note  Patient: James Estrada  Procedure(s) Performed: ABDOMINAL AORTIC ENDOVASCULAR STENT GRAFT (N/A ) ULTRASOUND GUIDANCE FOR VASCULAR ACCESS (Bilateral Groin)  Patient Location: PACU  Anesthesia Type:General  Level of Consciousness: awake, alert , oriented and patient cooperative  Airway & Oxygen Therapy: Patient Spontanous Breathing and Patient connected to nasal cannula oxygen  Post-op Assessment: Report given to RN and Post -op Vital signs reviewed and stable  Post vital signs: Reviewed and stable  Last Vitals:  Vitals Value Taken Time  BP 133/79 05/04/20 1047  Temp    Pulse 54 05/04/20 1048  Resp 11 05/04/20 1048  SpO2 99 % 05/04/20 1048  Vitals shown include unvalidated device data.  Last Pain:  Vitals:   05/04/20 0618  TempSrc: Oral  PainSc:          Complications: No complications documented.

## 2020-05-04 NOTE — Anesthesia Procedure Notes (Signed)
Procedure Name: Intubation Date/Time: 05/04/2020 7:45 AM Performed by: Waynard Edwards, CRNA Pre-anesthesia Checklist: Patient identified, Emergency Drugs available, Suction available and Patient being monitored Patient Re-evaluated:Patient Re-evaluated prior to induction Oxygen Delivery Method: Circle system utilized Preoxygenation: Pre-oxygenation with 100% oxygen Induction Type: IV induction Ventilation: Mask ventilation without difficulty Laryngoscope Size: Miller and 2 Grade View: Grade I Tube type: Oral Tube size: 7.5 mm Number of attempts: 1 Airway Equipment and Method: Stylet Placement Confirmation: ETT inserted through vocal cords under direct vision,  positive ETCO2 and breath sounds checked- equal and bilateral Secured at: 24 cm Tube secured with: Tape Dental Injury: Teeth and Oropharynx as per pre-operative assessment

## 2020-05-05 ENCOUNTER — Encounter (HOSPITAL_COMMUNITY): Payer: Self-pay | Admitting: Vascular Surgery

## 2020-05-05 LAB — CBC
HCT: 41.7 % (ref 39.0–52.0)
Hemoglobin: 13.7 g/dL (ref 13.0–17.0)
MCH: 28.7 pg (ref 26.0–34.0)
MCHC: 32.9 g/dL (ref 30.0–36.0)
MCV: 87.2 fL (ref 80.0–100.0)
Platelets: 91 10*3/uL — ABNORMAL LOW (ref 150–400)
RBC: 4.78 MIL/uL (ref 4.22–5.81)
RDW: 13.1 % (ref 11.5–15.5)
WBC: 13 10*3/uL — ABNORMAL HIGH (ref 4.0–10.5)
nRBC: 0 % (ref 0.0–0.2)

## 2020-05-05 LAB — BASIC METABOLIC PANEL
Anion gap: 9 (ref 5–15)
BUN: 14 mg/dL (ref 8–23)
CO2: 23 mmol/L (ref 22–32)
Calcium: 8.6 mg/dL — ABNORMAL LOW (ref 8.9–10.3)
Chloride: 106 mmol/L (ref 98–111)
Creatinine, Ser: 0.97 mg/dL (ref 0.61–1.24)
GFR calc Af Amer: >60 mL/min (ref >60)
GFR calc non Af Amer: >60 mL/min (ref >60)
Glucose, Bld: 125 mg/dL — ABNORMAL HIGH (ref 70–99)
Potassium: 3.7 mmol/L (ref 3.5–5.1)
Sodium: 138 mmol/L (ref 135–145)

## 2020-05-05 MED ORDER — SIMVASTATIN 20 MG PO TABS: 20.0000 mg | ORAL_TABLET | Freq: Every day | ORAL | 3 refills | Status: DC

## 2020-05-05 MED ORDER — HYDROCODONE-ACETAMINOPHEN 5-325 MG PO TABS: 1.0000 | ORAL_TABLET | Freq: Four times a day (QID) | ORAL | 0 refills | Status: DC | PRN

## 2020-05-05 MED ORDER — ASPIRIN EC 81 MG PO TBEC
81.0000 mg | DELAYED_RELEASE_TABLET | Freq: Every day | ORAL | Status: DC
Start: 2020-05-11 — End: 2021-06-08

## 2020-05-05 NOTE — Progress Notes (Signed)
Pt ambulated 600 ft in the hall. Tolerated well. D/c tele and IVs. Went over AVS with pt and pt's wife and all questions were answered.    Lawson Radar, RN

## 2020-05-05 NOTE — Discharge Instructions (Signed)
[facility]   Discharge Instructions  Endovascular Aortic Aneurysm Repair  Please refer to the following instructions for your post-procedure care. Your surgeon or Physician Assistant will discuss any changes with you.  Activity  You are encouraged to walk as much as you can. You can slowly return to normal activities but must avoid strenuous activity and heavy lifting until your doctor tells you it's OK. Avoid activities such as vacuuming or swinging a gold club. It is normal to feel tired for several weeks after your surgery. Do not drive until your doctor gives the OK and you are no longer taking prescription pain medications. It is also normal to have difficulty with sleep habits, eating, and bowel movements after surgery. These will go away with time.  Bathing/Showering  Shower daily after you go home.  Do not soak in a bathtub, hot tub, or swim until the incision heals completely.  If you have incisions in your groin, wash the groin wounds with soap and water daily and pat dry. (No tub bath-only shower)  Then put a dry gauze or washcloth there to keep this area dry to help prevent wound infection daily and as needed.  Do not use Vaseline or neosporin on your incisions.  Only use soap and water on your incisions and then protect and keep dry.  Incision Care  Shower every day. Clean your incision with mild soap and water. Pat the area dry with a clean towel. You do not need a bandage unless otherwise instructed. Do not apply any ointments or creams to your incision. If you clothing is irritating, you may cover your incision with a dry gauze pad.  Diet  Resume your normal diet. There are no special food restrictions following this procedure. A low fat/low cholesterol diet is recommended for all patients with vascular disease. In order to heal from your surgery, it is CRITICAL to get adequate nutrition. Your body requires vitamins, minerals, and protein.  Vegetables are the best source of vitamins and minerals. Vegetables also provide the perfect balance of protein. Processed food has little nutritional value, so try to avoid this.  Medications  Resume taking all of your medications unless your doctor or nurse practitioner tells you not to. If your incision is causing pain, you may take over-the-counter pain relievers such as acetaminophen (Tylenol). If you were prescribed a stronger pain medication, please be aware these medications can cause nausea and constipation. Prevent nausea by taking the medication with a snack or meal. Avoid constipation by drinking plenty of fluids and eating foods with a high amount of fiber, such as fruits, vegetables, and grains.  Do not take Tylenol if you are taking prescription pain medications.  Hold taking your Ibuprofen for a week.   Follow up  Our office will schedule a follow-up appointment with a CT scan 3-4 weeks after your surgery.  Please call us immediately for any of the following conditions   Severe or worsening pain in your legs or feet or in your abdomen back or chest.  Increased pain, redness, drainage (pus) from your incision site.  Increased abdominal pain, bloating, nausea, vomiting or persistent diarrhea.  Fever of 101 degrees or higher.  Swelling in your leg (s),   Reduce your risk of vascular disease  Stop smoking. If you would like help call [facility] at 1-800-QUIT-NOW (431-642-5142) or [place] at 939-603-4398. Manage your cholesterol Maintain a desired weight Control your diabetes Keep your blood pressure down  If you  have questions, please call the office at [phone].

## 2020-05-05 NOTE — Progress Notes (Addendum)
  Progress Note    05/05/2020 7:13 AM 1 Day Post-Op  Subjective:  Says he has some discomfort now in the left groin.  Says the catheter came out this morning and he has yet to void.   He has walked.   Tm 99.1 HR 50's-70's NSR 120's-150's systolic 98% RA  Vitals:   05/05/20 0015 05/05/20 0400  BP: 126/85 (!) 152/92  Pulse: 67 61  Resp: 15 12  Temp: 98.6 F (37 C) 99.1 F (37.3 C)  SpO2: 96% 98%    Physical Exam: Cardiac:  regular Lungs:  Non labored Incisions:  Left groin with some ecchymosis but soft; right groin soft without hematoma Extremities:  + palpable DP pulses bilaterally Abdomen:  Soft NT/ND; +flatus  CBC    Component Value Date/Time   WBC 13.0 (H) 05/05/2020 0430   RBC 4.78 05/05/2020 0430   HGB 13.7 05/05/2020 0430   HCT 41.7 05/05/2020 0430   PLT 91 (L) 05/05/2020 0430   MCV 87.2 05/05/2020 0430   MCH 28.7 05/05/2020 0430   MCHC 32.9 05/05/2020 0430   RDW 13.1 05/05/2020 0430    BMET    Component Value Date/Time   NA 138 05/05/2020 0430   K 3.7 05/05/2020 0430   CL 106 05/05/2020 0430   CO2 23 05/05/2020 0430   GLUCOSE 125 (H) 05/05/2020 0430   BUN 14 05/05/2020 0430   CREATININE 0.97 05/05/2020 0430   CALCIUM 8.6 (L) 05/05/2020 0430   GFRNONAA >60 05/05/2020 0430   GFRAA >60 05/05/2020 0430    INR    Component Value Date/Time   INR 1.1 05/04/2020 1054     Intake/Output Summary (Last 24 hours) at 05/05/2020 9892 Last data filed at 05/05/2020 0500 Gross per 24 hour  Intake 2219.98 ml  Output 3005 ml  Net -785.02 ml     Assessment:  79 y.o. male is s/p:  Gore stent graft repair abdominal aortic aneurysm  1 Day Post-Op  Plan: -pt doing well today with palpable DP pulses bilaterally -he does have some ecchymosis & discomfort in the left groin but it is soft.  Thrombocytopenia-platelets down to 91k this am from 98k yesterday post op and 119k pre-op.  Pt not on aspirin pre op.  D/w Dr. Arbie Cookey and will wait and start baby aspirin  next Monday -acute blood loss anemia-tolerating -foley discontinued this morning-pt will need to void prior to discharge. -pt has walked in the hallways -pt started on statin this admission.  F/u with PCP in 4 weeks to check lab work.  -discharge home later this am once he has voided.  -f/u with Dr. Arbie Cookey in 4 weeks with CTA   Doreatha Massed, PA-C [facility] [phone] 05/05/2020 7:13 AM

## 2020-05-05 NOTE — Discharge Summary (Signed)
EVAR Discharge Summary   James Estrada December 19, 1940 79 y.o. male  MRN: [id number]  Admission Date: 05/04/2020  Discharge Date: 05/08/2020  Physician: No att. providers found  Admission Diagnosis: AAA (abdominal aortic aneurysm) without rupture (HCC) [I71.4]   HPI:   This is a 79 y.o. male here today for follow-up of his known abdominal aortic aneurysm.  I did see him 6 months ago with CT scan showing maximal aneurysm size approximately 5.0 cm.  He is here today for further discussion after CT of his chest abdomen pelvis and runoff.  His wife is with him today for discussion.  He remains in excellent health.  He is quite active.  He is no cardiac issues other than occasional PACs.  Has no symptoms referable to his aneurysm  Hospital Course:  The patient was admitted to the hospital and taken to the operating room on 05/04/2020 and underwent: Gore stent graft repair abdominal aortic aneurysm    The pt tolerated the procedure well and was transported to the PACU in good condition.   By POD 1, he was doing well with palpable DP pulses bilaterally.  He did have some ecchymosis and soreness in the left groin, but it was soft.  Right groin soft without hematoma.  Abdomen is soft.  His foley was removed and pt was able to void.    The remainder of the hospital course consisted of increasing mobilization and increasing intake of solids without difficulty.  CBC    Component Value Date/Time   WBC 13.0 (H) 05/05/2020 0430   RBC 4.78 05/05/2020 0430   HGB 13.7 05/05/2020 0430   HCT 41.7 05/05/2020 0430   PLT 91 (L) 05/05/2020 0430   MCV 87.2 05/05/2020 0430   MCH 28.7 05/05/2020 0430   MCHC 32.9 05/05/2020 0430   RDW 13.1 05/05/2020 0430    BMET    Component Value Date/Time   NA 138 05/05/2020 0430   K 3.7 05/05/2020 0430   CL 106 05/05/2020 0430   CO2 23 05/05/2020 0430   GLUCOSE 125 (H) 05/05/2020 0430   BUN 14 05/05/2020 0430   CREATININE 0.97 05/05/2020 0430   CALCIUM  8.6 (L) 05/05/2020 0430   GFRNONAA >60 05/05/2020 0430   GFRAA >60 05/05/2020 0430       Discharge Instructions    Discharge patient   Complete by: As directed    Discharge disposition: 01-Home or Self Care   Discharge patient date: 05/05/2020   Discharge patient   Complete by: As directed    Discharge pt once he has walked and voided.   Discharge disposition: 01-Home or Self Care   Discharge patient date: 05/05/2020      Discharge Diagnosis:  AAA (abdominal aortic aneurysm) without rupture [facility]) [I71.4]  Secondary Diagnosis: Patient Active Problem List   Diagnosis Date Noted  . AAA (abdominal aortic aneurysm) without rupture (HCC) 05/04/2020  . Trochanteric bursitis, right hip 08/20/2018  . History of right hip replacement 08/20/2018  . Status post total replacement of right hip 09/08/2017  . Pain of right hip joint 08/07/2017  . Unilateral primary osteoarthritis, right hip 08/07/2017  . BPH (benign prostatic hypertrophy) with urinary obstruction 05/15/2013   Past Medical History:  Diagnosis Date  . AAA (abdominal aortic aneurysm) (HCC)    last u/s done 07/18/17   . Arthritis   . Bradycardia   . Dysrhythmia    frequent PAC, for 20 years  . GERD (gastroesophageal reflux disease)    occ, OTC  .  Hemorrhoids   . History of hiatal hernia   . Hyperlipidemia   . Hypertension   . Seasonal allergies      Allergies as of 05/05/2020   No Known Allergies     Medication List    TAKE these medications   aspirin EC 81 MG tablet Take 1 tablet (81 mg total) by mouth daily. Start taking on: May 11, 2020   fluocinonide cream 0.05 % Commonly known as: LIDEX Apply 1 application topically 2 (two) times daily as needed (ezcema flares).   HYDROcodone-acetaminophen 5-325 MG tablet Commonly known as: NORCO/VICODIN Take 1 tablet by mouth every 6 (six) hours as needed for moderate pain.   ibuprofen 200 MG tablet Commonly known as: ADVIL Take 200-400 mg by mouth every 8  (eight) hours as needed for headache or moderate pain.   simvastatin 20 MG tablet Commonly known as: ZOCOR Take 1 tablet (20 mg total) by mouth daily at 6 PM.       Discharge Instructions:  [facility]  Discharge Instructions Endovascular Aortic Aneurysm Repair  Please refer to the following instructions for your post-procedure care. Your surgeon or Physician Assistant will discuss any changes with you.  Activity  You are encouraged to walk as much as you can. You can slowly return to normal activities but must avoid strenuous activity and heavy lifting until your doctor tells you it's OK. Avoid activities such as vacuuming or swinging a gold club. It is normal to feel tired for several weeks after your surgery. Do not drive until your doctor gives the OK and you are no longer taking prescription pain medications. It is also normal to have difficulty with sleep habits, eating, and bowel movements after surgery. These will go away with time.  Bathing/Showering  You may shower after you go home. If you have an incision, do not soak in a bathtub, hot tub, or swim until the incision heals completely.  Incision Care  Shower every day. Clean your incision with mild soap and water. Pat the area dry with a clean towel. You do not need a bandage unless otherwise instructed. Do not apply any ointments or creams to your incision. If you clothing is irritating, you may cover your incision with a dry gauze pad.  Diet  Resume your normal diet. There are no special food restrictions following this procedure. A low fat/low cholesterol diet is recommended for all patients with vascular disease. In order to heal from your surgery, it is CRITICAL to get adequate nutrition. Your body requires vitamins, minerals, and protein. Vegetables are the best source of vitamins and minerals. Vegetables also provide the perfect balance of protein. Processed food has little nutritional  value, so try to avoid this.  Medications  Resume taking all of your medications unless your doctor or Physician Assistnat tells you not to. If your incision is causing pain, you may take over-the-counter pain relievers such as acetaminophen (Tylenol). If you were prescribed a stronger pain medication, please be aware these medications can cause nausea and constipation. Prevent nausea by taking the medication with a snack or meal. Avoid constipation by drinking plenty of fluids and eating foods with a high amount of fiber, such as fruits, vegetables, and grains.  Do not take Tylenol if you are taking prescription pain medications.   Follow up  Our office will schedule a follow-up appointment with a C.T. scan 3-4 weeks after your surgery.  Please call us immediately for any of the following  conditions  . Severe or worsening pain in your legs or feet or in your abdomen back or chest. . Increased pain, redness, drainage (pus) from your incision sit. . Increased abdominal pain, bloating, nausea, vomiting or persistent diarrhea. . Fever of 101 degrees or higher. . Swelling in your leg (s), .  Reduce your risk of vascular disease  .Stop smoking. If you would like help call [facility] at 1-800-QUIT-NOW (404)766-4153) or [place] at 862 319 1325. .Manage your cholesterol .Maintain a desired weight .Control your diabetes .Keep your blood pressure down  If you have questions, please call the office at [phone].   Prescriptions given: 1.  Norco #8 No Refill  Disposition: home  Patient's condition: is Good  Follow up: 1. Dr. Donnetta Hutching in 4 weeks with CTA protocol   Leontine Locket, PA-C [facility] [phone] 05/08/2020  5:18 PM   - For VQI Registry use - Post-op:  Time to Extubation: [x]  In OR, [ ]  < 12 hrs, [ ]  12-24 hrs, [ ]  >=24 hrs Vasopressors Req. Post-op: No MI: No., [ ]  Troponin only, [ ]  EKG or Clinical New Arrhythmia: No CHF: No ICU Stay:  1 day in progressive Transfusion: No     If yes, n/a units given  Complications: Resp failure: No., [ ]  Pneumonia, [ ]  Ventilator Chg in renal function: No., [ ]  Inc. Cr > 0.5, [ ]  Temp. Dialysis,  [ ]  Permanent dialysis Leg ischemia: No., no Surgery needed, [ ]  Yes, Surgery needed,  [ ]  Amputation Bowel ischemia: No., [ ]  Medical Rx, [ ]  Surgical Rx Wound complication: No., [ ]  Superficial separation/infection, [ ]  Return to OR Return to OR: No  Return to OR for bleeding: No Stroke: No., [ ]  Minor, [ ]  Major  Discharge medications: Statin use:  Yes  ASA use:  Yes  Plavix use:  No  Beta blocker use:  No  ARB use:  No ACEI use:  No CCB use:  No

## 2020-05-06 ENCOUNTER — Telehealth: Payer: Self-pay

## 2020-05-06 ENCOUNTER — Other Ambulatory Visit: Payer: Self-pay

## 2020-05-06 DIAGNOSIS — I714 Abdominal aortic aneurysm, without rupture, unspecified: Secondary | ICD-10-CM

## 2020-05-06 NOTE — Telephone Encounter (Signed)
Pt called triage with c/o fever btwn 100-101 since surgery. He has no other symptoms; incision is clean/dry/intact without redness/drainage. He feels his pain level is improving since being home. He has not taken any pain medication. Encouraged him to take Tylenol and stay well hydrated. If fever persists tomorrow or gets any higher today pt. Understands he is to call us back. Pt verbalized understanding.

## 2020-05-07 ENCOUNTER — Telehealth: Payer: Self-pay

## 2020-05-07 NOTE — Telephone Encounter (Signed)
Called pt to f/u on his temps since we spoke yesterday. Pt reports feeling better with no temp. Today. Pt will call us if anything changes.

## 2020-05-08 ENCOUNTER — Telehealth: Payer: Self-pay | Admitting: *Deleted

## 2020-05-08 NOTE — Telephone Encounter (Signed)
Patient called stating he has had a low grade fever of 100-100.1 and has been taking Tylenol.  He denies pain or drainage of incision sites. I encouraged patient to increase his fluid intake and continue to monitor.  If fever increases he is to call back.  Patient acknowledged understanding of the instructions.

## 2020-05-27 ENCOUNTER — Ambulatory Visit: Payer: Medicare Other | Admitting: Adult Health

## 2020-05-27 ENCOUNTER — Encounter: Payer: Self-pay | Admitting: Adult Health

## 2020-05-27 ENCOUNTER — Other Ambulatory Visit: Payer: Self-pay

## 2020-05-27 VITALS — BP 132/85 | HR 63 | Ht 72.0 in | Wt 177.2 lb

## 2020-05-27 DIAGNOSIS — Z79899 Other long term (current) drug therapy: Secondary | ICD-10-CM | POA: Diagnosis not present

## 2020-05-27 DIAGNOSIS — R001 Bradycardia, unspecified: Secondary | ICD-10-CM | POA: Diagnosis not present

## 2020-05-27 DIAGNOSIS — Z8679 Personal history of other diseases of the circulatory system: Secondary | ICD-10-CM

## 2020-05-27 DIAGNOSIS — I491 Atrial premature depolarization: Secondary | ICD-10-CM

## 2020-05-27 DIAGNOSIS — E785 Hyperlipidemia, unspecified: Secondary | ICD-10-CM

## 2020-05-27 DIAGNOSIS — I714 Abdominal aortic aneurysm, without rupture, unspecified: Secondary | ICD-10-CM

## 2020-05-27 DIAGNOSIS — D696 Thrombocytopenia, unspecified: Secondary | ICD-10-CM

## 2020-05-27 DIAGNOSIS — Z9889 Other specified postprocedural states: Secondary | ICD-10-CM

## 2020-05-27 NOTE — Addendum Note (Signed)
Addended by: Barrie Dunker on: 05/27/2020 10:01 AM   Modules accepted: Orders

## 2020-05-27 NOTE — Progress Notes (Signed)
Cardiology Office Note   Date:  05/27/2020   ID:  James Estrada, DOB [birth year], MRN [id number]  PCP:  Little Ishikawa, MD  Cardiologist:  Dr.Schumann  CC: Close follow up-Per Dr.Early   History of Present Illness: James Estrada is a 79 y.o. male who presents for post operative follow up after having aortic aneurysm repair by Dr. Arbie Cookey on 05/04/2020. Dr. Arbie Cookey also placed him on simvastatin. He has a history of hyperlipidemia documented in his chart, but I do not have labs to confirm and hypertension,. Other history includes frequent PAC's and bradycardia found on EKG's prior to operative procedure. He is asymptomatic with this.   He states he feels very well.  He has noticed that his heart rate has been up and down but primarily between 40 and 60 bpm.  He denies any chest pain, dizziness, dyspnea on exertion, or profound fatigue.  He is confused as to why he was placed on simvastatin and aspirin postoperatively by Dr. Arbie Cookey.  He will need to have some fasting lipids and LFTs to evaluate his status.   Past Medical History:  Diagnosis Date  . AAA (abdominal aortic aneurysm) (HCC)    last u/s done 07/18/17   . Arthritis   . Bradycardia   . Dysrhythmia    frequent PAC, for 20 years  . GERD (gastroesophageal reflux disease)    occ, OTC  . Hemorrhoids   . History of hiatal hernia   . Hyperlipidemia   . Hypertension   . Seasonal allergies     Past Surgical History:  Procedure Laterality Date  . ABDOMINAL AORTIC ENDOVASCULAR STENT GRAFT N/A 05/04/2020   Procedure: ABDOMINAL AORTIC ENDOVASCULAR STENT GRAFT;  Surgeon: Larina Earthly, MD;  Location: [facility] OR;  Service: Vascular;  Laterality: N/A;  . CATARACT EXTRACTION Bilateral 2009  . DIAGNOSTIC LAPAROSCOPY     laparoscopic hernia repair  . EYE SURGERY Bilateral    cataract removal  . HERNIA REPAIR Bilateral 1999, 2006  . JOINT REPLACEMENT Right ~2018   hip replacement  . pheochromocytoma  1993  . PROSTATECTOMY N/A  05/15/2013   Procedure: PROSTATECTOMY RETROPUBIC; SIMPLE OPEN PROSTATECTOMY;  Surgeon: Valetta Fuller, MD;  Location: [facility] ORS;  Service: Urology;  Laterality: N/A;  . TOTAL ELBOW REPLACEMENT Left    > 30 years ago  . TOTAL HIP ARTHROPLASTY Right 09/08/2017   Procedure: RIGHT TOTAL HIP ARTHROPLASTY ANTERIOR APPROACH;  Surgeon: Kathryne Hitch, MD;  Location: [facility] ORS;  Service: Orthopedics;  Laterality: Right;  . ULTRASOUND GUIDANCE FOR VASCULAR ACCESS Bilateral 05/04/2020   Procedure: ULTRASOUND GUIDANCE FOR VASCULAR ACCESS;  Surgeon: Larina Earthly, MD;  Location: [facility] OR;  Service: Vascular;  Laterality: Bilateral;     Current Outpatient Medications  Medication Sig Dispense Refill  . fluocinonide cream (LIDEX) 0.05 % Apply 1 application topically 2 (two) times daily as needed (ezcema flares).    . simvastatin (ZOCOR) 20 MG tablet Take 1 tablet (20 mg total) by mouth daily at 6 PM. 30 tablet 3  . aspirin EC 81 MG tablet Take 1 tablet (81 mg total) by mouth daily. (Patient not taking: Reported on 05/27/2020)     No current facility-administered medications for this visit.    Allergies:   Patient has no known allergies.    Social History:  The patient  reports that he has never smoked. He has never used smokeless tobacco. He reports current alcohol use. He reports that he does not use drugs.   Family  History:  The patient's family history includes Aneurysm in his father; Coronary artery disease in his mother; Diabetes in his sister; Heart disease in his mother.    ROS: All other systems are reviewed and negative. Unless otherwise mentioned in H&P    PHYSICAL EXAM: VS:  BP 132/85   Pulse 63   Ht 6' (1.829 m)   Wt 177 lb 3.2 oz (80.4 kg)   SpO2 98%   BMI 24.03 kg/m  , BMI Body mass index is 24.03 kg/m. GEN: Well nourished, well developed, in no acute distress HEENT: normal Neck: no JVD, carotid bruits, or masses Cardiac: IRRR; no murmurs, rubs, or gallops,no edema  Respiratory:   Clear to auscultation bilaterally, normal work of breathing GI: soft, nontender, nondistended, + BS MS: no deformity or atrophy Skin: warm and dry, no rash Neuro:  Strength and sensation are intact Psych: euthymic mood, full affect   EKG: Sinus bradycardia with sinus arrhythmia heart rate of 46 bpm.  Repeat EKG revealed sinus bradycardia with PACs heart rate of 50 bpm, 30 EKG revealed sinus rhythm with PVCs heart rate of 60 bpm.  (All personally reviewed)  Recent Labs: 04/30/2020: ALT 22 05/04/2020: Magnesium 2.2 05/05/2020: BUN 14; Creatinine, Ser 0.97; Hemoglobin 13.7; Platelets 91; Potassium 3.7; Sodium 138    Lipid Panel No results found for: CHOL, TRIG, HDL, CHOLHDL, VLDL, LDLCALC, LDLDIRECT    Wt Readings from Last 3 Encounters:  05/27/20 177 lb 3.2 oz (80.4 kg)  05/04/20 175 lb (79.4 kg)  04/30/20 176 lb 6 oz (80 kg)      Other studies Reviewed: Operative report reviewed.  ASSESSMENT AND PLAN:  1.  AAA: Status post aneurysm repair by Dr. Arbie Cookey on 05/04/2020.  The patient is feeling better and is asymptomatic for chest pain, dyspnea on exertion, or dizziness.  He has been started on 81 mg aspirin daily for prophylactic purposes.  The patient mentioned that he does have thrombocytopenia and is concerned about staying on the aspirin.  His platelets were 91 on most recent labs on 05/05/2020.  I have advised him to continue on 81 mg aspirin but make sure it is enteric-coated. IF platelets are lower will recommend stopping ASA.  He will need to have follow-up lab work, we can complete this with follow-up labs for lipids and LFTs.  2.  Hyperlipidemia: Noted only in the record.  He was started on simvastatin 20 mg at at bedtime by Dr. Arbie Cookey.  We will check fasting lipids and LFTs in the a.m.  Goal of LDL less than 70, with total cholesterol less than 200.  The patient would like not to be on statin therapy if possible.  We will evaluate his labs and make more recommendations based upon  the results.  3.Bradycardia: Noted on EKG with frequent PAC's and PVC's. He is asymptomatic. Can consider cardiac monitor to evaluate frequency and rates if he is symptomatic.    Current medicines are reviewed at length with the patient today.  I have spent 20 minutes dedicated to the care of this patient on the date of this encounter to include pre-visit review of records, assessment, management and diagnostic testing,with shared decision making.  Labs/ tests ordered today include: CBC and Fasting Lipids and LFT's.   Bettey Mare. Liborio Nixon, ANP, AACC   05/27/2020 9:15 AM    [facility] [address] Office [phone] Fax [phone]  Notice: This dictation was prepared with Dragon dictation along with smaller phrase  technology. Any transcriptional errors that result from this process are unintentional and may not be corrected upon review.

## 2020-05-27 NOTE — Patient Instructions (Signed)
Medication Instructions:  Continue current medications  *If you need a refill on your cardiac medications before your next appointment, please call your pharmacy*   Lab Work: Fasting Lipid and Liver    Testing/Procedures: None Ordered   Follow-Up: At [facility], you and your health needs are our priority.  As part of our continuing mission to provide you with exceptional heart care, we have created designated Provider Care Teams.  These Care Teams include your primary Cardiologist (physician) and Advanced Practice Providers (APPs -  Physician Assistants and Nurse Practitioners) who all work together to provide you with the care you need, when you need it.  We recommend signing up for the patient portal called "MyChart".  Sign up information is provided on this After Visit Summary.  MyChart is used to connect with patients for Virtual Visits (Telemedicine).  Patients are able to view lab/test results, encounter notes, upcoming appointments, etc.  Non-urgent messages can be sent to your provider as well.   To learn more about what you can do with MyChart, go to [email or web address].    Your next appointment:   6 week(s)  The format for your next appointment:   In Person  Provider:   Epifanio Lesches, MD

## 2020-05-28 ENCOUNTER — Other Ambulatory Visit: Payer: Medicare Other

## 2020-05-28 DIAGNOSIS — D696 Thrombocytopenia, unspecified: Secondary | ICD-10-CM | POA: Diagnosis not present

## 2020-05-28 DIAGNOSIS — Z79899 Other long term (current) drug therapy: Secondary | ICD-10-CM | POA: Diagnosis not present

## 2020-05-28 DIAGNOSIS — E785 Hyperlipidemia, unspecified: Secondary | ICD-10-CM | POA: Diagnosis not present

## 2020-05-28 LAB — CBC WITH DIFFERENTIAL/PLATELET
Basophils Absolute: 0.1 10*3/uL (ref 0.0–0.2)
Basos: 1 %
EOS (ABSOLUTE): 0.2 10*3/uL (ref 0.0–0.4)
Eos: 2 %
Hematocrit: 41.5 % (ref 37.5–51.0)
Hemoglobin: 13.8 g/dL (ref 13.0–17.7)
Immature Grans (Abs): 0.1 10*3/uL (ref 0.0–0.1)
Immature Granulocytes: 2 %
Lymphocytes Absolute: 1.7 10*3/uL (ref 0.7–3.1)
Lymphs: 21 %
MCH: 27.8 pg (ref 26.6–33.0)
MCHC: 33.3 g/dL (ref 31.5–35.7)
MCV: 84 fL (ref 79–97)
Monocytes Absolute: 0.9 10*3/uL (ref 0.1–0.9)
Monocytes: 11 %
Neutrophils Absolute: 5 10*3/uL (ref 1.4–7.0)
Neutrophils: 63 %
Platelets: 151 10*3/uL (ref 150–450)
RBC: 4.97 x10E6/uL (ref 4.14–5.80)
RDW: 12.8 % (ref 11.6–15.4)
WBC: 7.9 10*3/uL (ref 3.4–10.8)

## 2020-05-28 LAB — HEPATIC FUNCTION PANEL
ALT: 13 IU/L (ref 0–44)
AST: 15 IU/L (ref 0–40)
Albumin: 3.8 g/dL (ref 3.7–4.7)
Alkaline Phosphatase: 70 IU/L (ref 48–121)
Bilirubin Total: 0.6 mg/dL (ref 0.0–1.2)
Bilirubin, Direct: 0.2 mg/dL (ref 0.00–0.40)
Total Protein: 6.3 g/dL (ref 6.0–8.5)

## 2020-05-28 LAB — LIPID PANEL
Chol/HDL Ratio: 3.4 ratio (ref 0.0–5.0)
Cholesterol, Total: 146 mg/dL (ref 100–199)
HDL: 43 mg/dL (ref >39)
LDL Chol Calc (NIH): 87 mg/dL (ref 0–99)
Triglycerides: 82 mg/dL (ref 0–149)
VLDL Cholesterol Cal: 16 mg/dL (ref 5–40)

## 2020-05-29 ENCOUNTER — Encounter (HOSPITAL_COMMUNITY): Payer: Self-pay

## 2020-05-29 ENCOUNTER — Ambulatory Visit (HOSPITAL_COMMUNITY)
Admission: RE | Admit: 2020-05-29 | Discharge: 2020-05-29 | Disposition: A | Discharge status: Home / Self Care | Payer: Medicare Other | Source: Ambulatory Visit | Attending: Vascular Surgery | Admitting: Vascular Surgery

## 2020-05-29 ENCOUNTER — Other Ambulatory Visit: Payer: Self-pay

## 2020-05-29 DIAGNOSIS — I700 Atherosclerosis of aorta: Secondary | ICD-10-CM | POA: Diagnosis not present

## 2020-05-29 DIAGNOSIS — I714 Abdominal aortic aneurysm, without rupture, unspecified: Secondary | ICD-10-CM

## 2020-05-29 DIAGNOSIS — I771 Stricture of artery: Secondary | ICD-10-CM | POA: Diagnosis not present

## 2020-05-29 DIAGNOSIS — I728 Aneurysm of other specified arteries: Secondary | ICD-10-CM | POA: Diagnosis not present

## 2020-05-29 MED ORDER — IOHEXOL 350 MG/ML SOLN
100.0000 mL | Freq: Once | INTRAVENOUS | Status: AC | PRN
  Administered 2020-05-29: 100 mL via INTRAVENOUS

## 2020-05-29 MED ORDER — SODIUM CHLORIDE (PF) 0.9 % IJ SOLN
INTRAMUSCULAR | Status: AC
  Filled 2020-05-29: qty 50

## 2020-06-02 ENCOUNTER — Other Ambulatory Visit: Payer: Self-pay

## 2020-06-02 ENCOUNTER — Encounter: Payer: Self-pay | Admitting: Vascular Surgery

## 2020-06-02 ENCOUNTER — Ambulatory Visit (INDEPENDENT_AMBULATORY_CARE_PROVIDER_SITE_OTHER): Payer: Self-pay | Admitting: Vascular Surgery

## 2020-06-02 VITALS — BP 130/86 | HR 54 | Temp 98.3°F | Resp 20 | Ht 72.0 in | Wt 179.0 lb

## 2020-06-02 DIAGNOSIS — I714 Abdominal aortic aneurysm, without rupture, unspecified: Secondary | ICD-10-CM

## 2020-06-02 NOTE — Progress Notes (Signed)
   Patient name: James Estrada MRN: [id number] DOB: 1941-04-15 Sex: male  REASON FOR VISIT: Follow-up stent graft repair abdominal aortic aneurysm  HPI: James Estrada is a 79 y.o. male here today for follow-up.  Underwent uneventful stent graft repair of abdominal aortic aneurysm.  Was discharged home on postoperative day 1.  Did have significant bruising in his medial thigh.  Also interestingly reported that he had a low-grade fever for up to a week following surgery.  Current Outpatient Medications  Medication Sig Dispense Refill  . aspirin EC 81 MG tablet Take 1 tablet (81 mg total) by mouth daily.    . fluocinonide cream (LIDEX) 0.05 % Apply 1 application topically 2 (two) times daily as needed (ezcema flares).    . simvastatin (ZOCOR) 20 MG tablet Take 1 tablet (20 mg total) by mouth daily at 6 PM. 30 tablet 3   No current facility-administered medications for this visit.     PHYSICAL EXAM: Vitals:   06/02/20 1536  BP: 130/86  Pulse: (!) 54  Resp: 20  Temp: 98.3 F (36.8 C)  SpO2: 95%  Weight: 179 lb (81.2 kg)  Height: 6' (1.829 m)    GENERAL: The patient is a well-nourished male, in no acute distress. The vital signs are documented above. Bilateral groin punctures without aneurysm.  Abdomen nontender  MEDICAL ISSUES: CT scan from 05/29/2020 was reviewed with the patient.  This shows excellent positioning of his stent graft with no evidence of endoleak.  He does have a known left internal iliac artery aneurysm which is partially thrombosed.  This will be followed over time.  Would be a candidate for coiling and extension to the external iliac should he have any evidence of growth.  He does have a chronically low platelet count and therefore I will stop his aspirin.  He has had no evidence of hyperlipidemia and therefore we will stop discontinue his statin that was began postoperatively.  We will see him again in 1 year with ultrasound  follow-up   Larina Earthly, MD [facility] Tel [phone] Pager [phone]

## 2020-07-11 IMAGING — CT CT CTA ABD/PEL W/CM AND/OR W/O CM
1 of 4 series · 12 of 32 positions shown, 17 images · IV contrast (APPLIED)
Comparison: Ultrasound 08/12/2019, CT chest 11/28/2005

CLINICAL DATA: Abdominal aortic aneurysm, currently asymptomatic,
enlarging on recent ultrasound

EXAM:
CTA ABDOMEN AND PELVIS WITH CONTRAST
TECHNIQUE: Multidetector CT imaging of the abdomen and pelvis was performed
using the standard protocol during bolus administration of
intravenous contrast. Multiplanar reconstructed images and MIPs were
obtained and reviewed to evaluate the vascular anatomy.
CONTRAST:  75mL PQPSND-KHA IOPAMIDOL (PQPSND-KHA) INJECTION 76%

[Series 5: pre stent angio · axial · non-contrast · 0.74mm/px · z∈[-504,-118]mm · 12 of 436 slices shown, 17 images]
[im 25/436  soft-tissue]
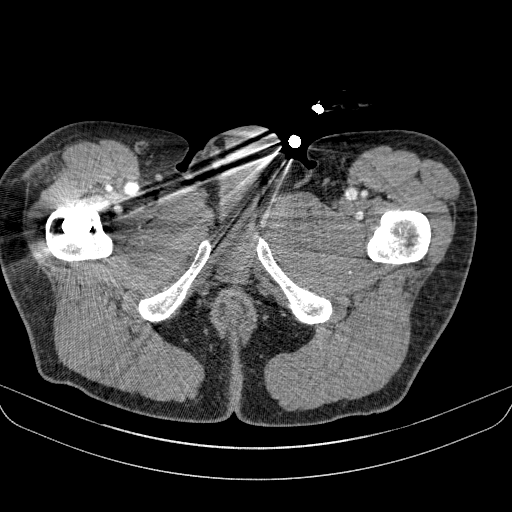
[im 25/436  bone]
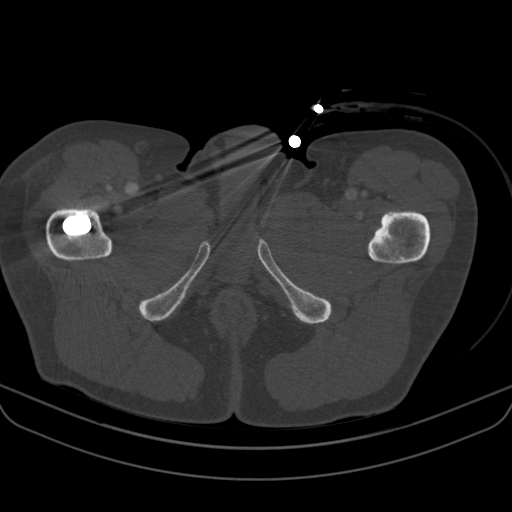
[im 73/436  soft-tissue]
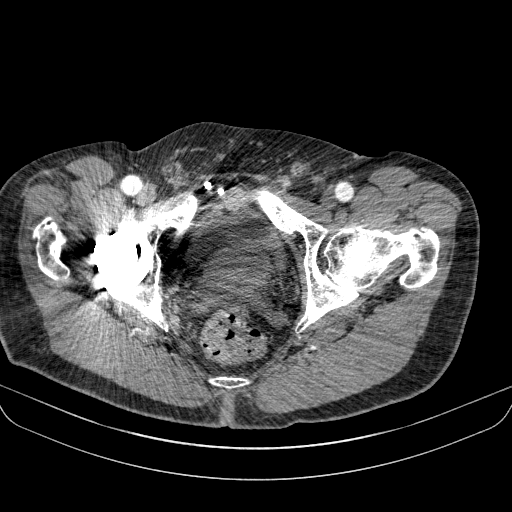
[im 97/436  soft-tissue]
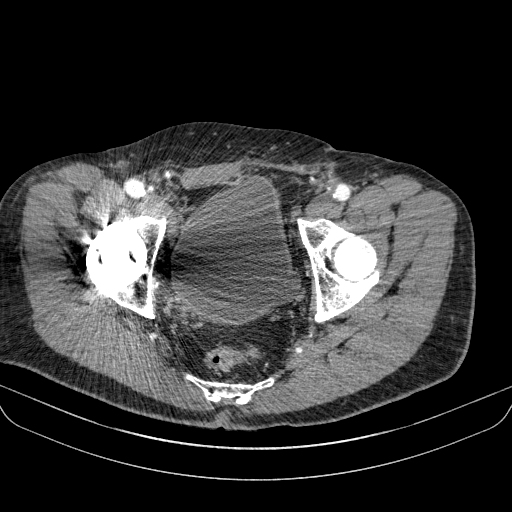
[im 146/436  soft-tissue]
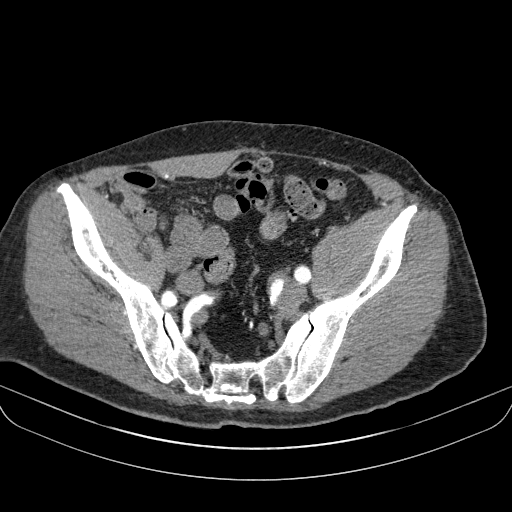
[im 170/436  soft-tissue]
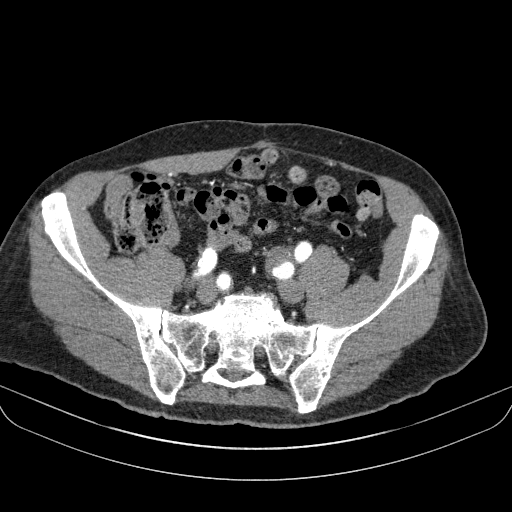
[im 218/436  soft-tissue]
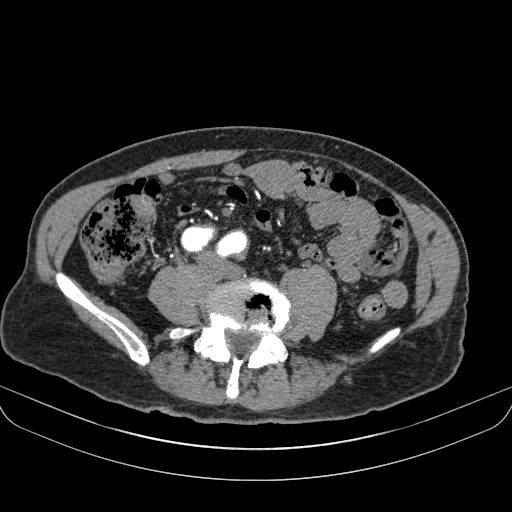
[im 266/436  soft-tissue]
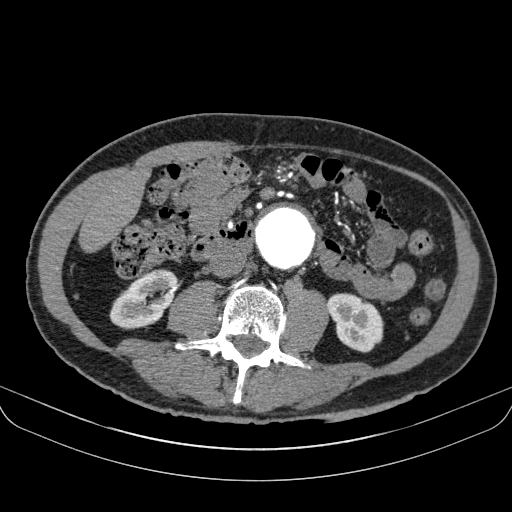
[im 291/436  soft-tissue]
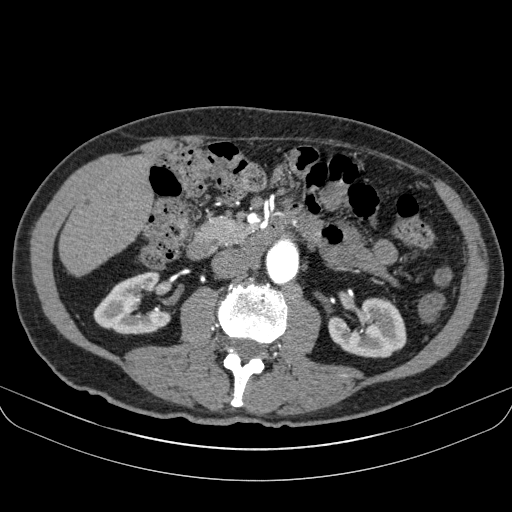
[im 339/436  soft-tissue]
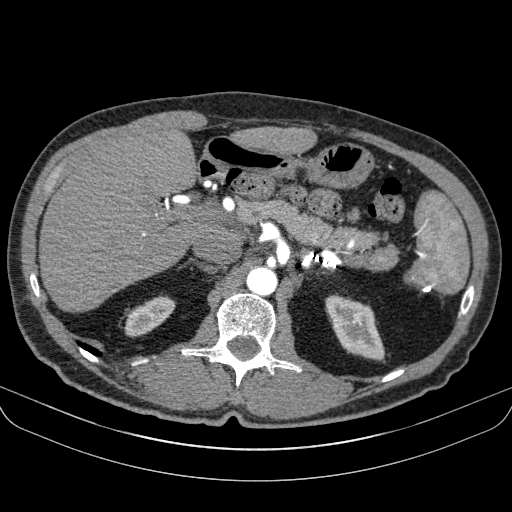
[im 339/436  lung]
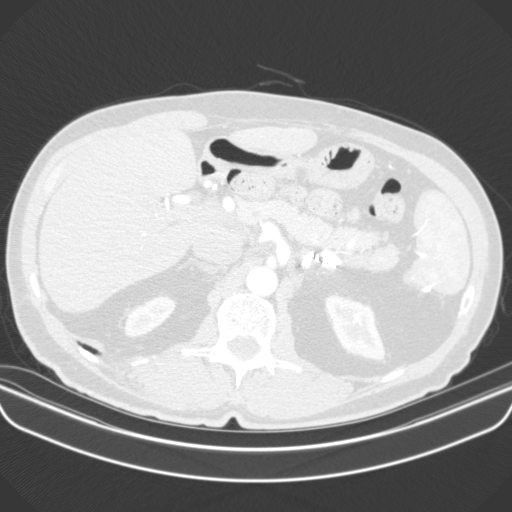
[im 339/436  bone]
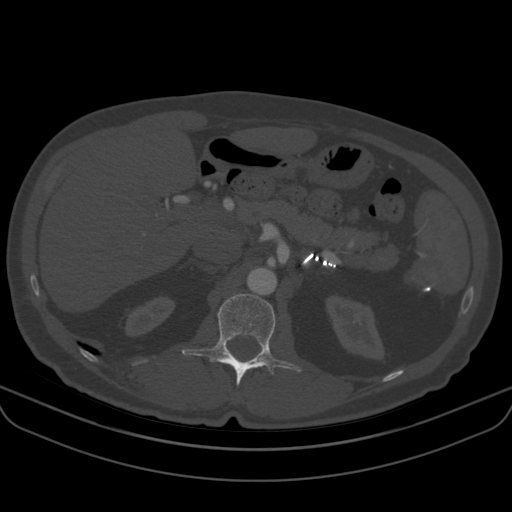
[im 363/436  soft-tissue]
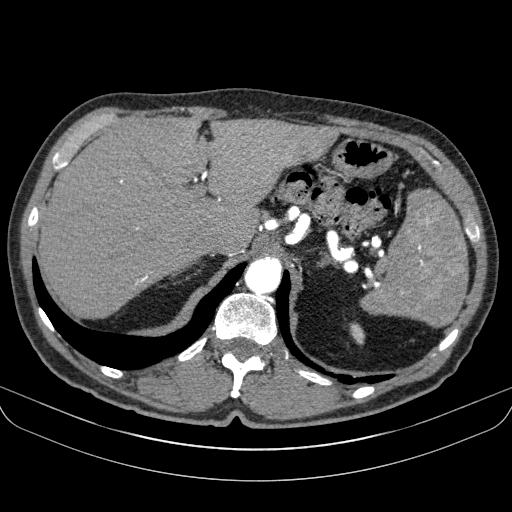
[im 363/436  lung]
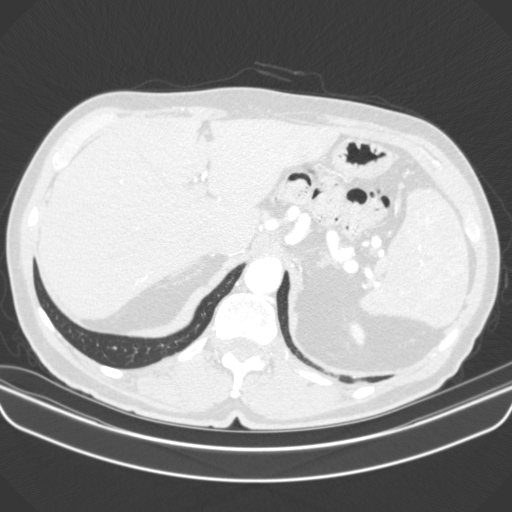
[im 387/436  lung]
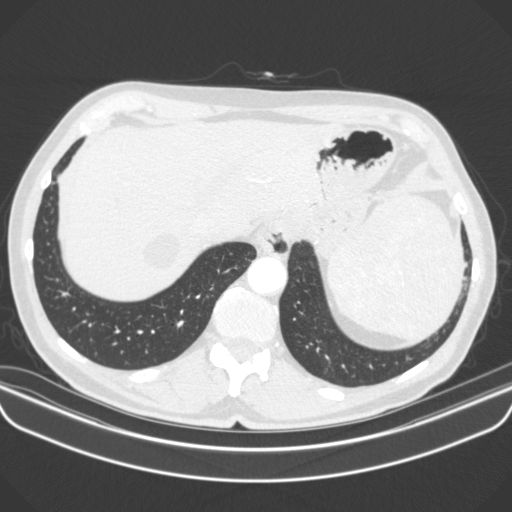
[im 411/436  soft-tissue]
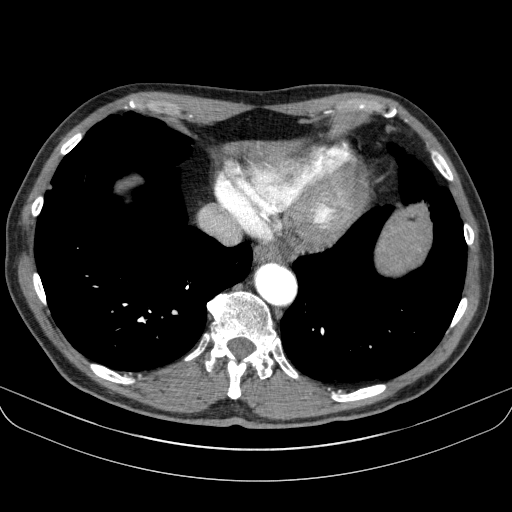
[im 411/436  lung]
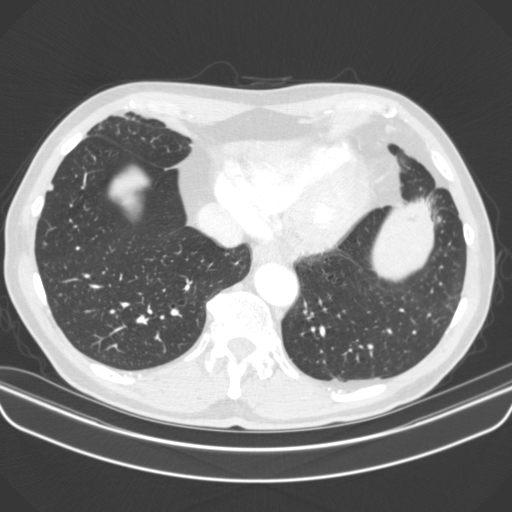

[12 of 32 positions shown; findings below may reference images not displayed]

FINDINGS: VASCULAR

Aorta: Moderate tortuosity. Ectatic proximal segments. Fusiform
infrarenal aneurysm 5 x 4.9 cm maximum transverse dimensions,
tapering to 2.9 cm above the bifurcation. Minimal nonocclusive mural
thrombus in the aneurysmal segment. No leak or evidence of impending
rupture. No dissection or stenosis.

Celiac: Ectatic up to 1.2 cm diameter. Distal branches unremarkable.

SMA: Patent without evidence of aneurysm, dissection, vasculitis or
significant stenosis.

Renals: Single left, widely patent.

Single right, widely patent.

IMA: Patent, arising from the aneurysmal segment of the aorta.

Inflow: On the right, fusiform dilatation of distal common iliac up
to 2.5 cm diameter. 2 cm internal iliac short-segment aneurysm
containing some nonocclusive mural thrombus. External iliac is
mildly tortuous, ectatic, with scattered calcified nonocclusive
plaque.

On the left, tortuous common iliac dilated up to 2.8 cm diameter.
2.7 cm dilatation of the proximal internal iliac just beyond its
origin. External iliac is ectatic with mild plaque, no stenosis.

Proximal Outflow: Bilateral common femoral and visualized portions
of the superficial and profunda femoral arteries are patent without
evidence of aneurysm, dissection, vasculitis or significant
stenosis.

Veins: Patent portal vein and bilateral renal veins. No venous
pathology identified.

Review of the MIP images confirms the above findings.

NON-VASCULAR

Lower chest: Subcentimeter calcified granuloma, lateral basal
segment left lower lobe. No pleural or pericardial effusion. Small
hiatal hernia.

Hepatobiliary: Stable cysts in the right hepatic lobe. No new liver
lesion. Gallbladder unremarkable. No biliary ductal dilatation.

Pancreas: Unremarkable. No pancreatic ductal dilatation or
surrounding inflammatory changes.

Spleen: Normal in size without focal abnormality.

Adrenals/Urinary Tract: Surgical clips proximal to the left adrenal
as before. No mass or nodule. Normal renal enhancement. No
hydronephrosis. Urinary bladder physiologically distended.

Stomach/Bowel: Small hiatal hernia. Stomach is nondistended. Small
bowel decompressed. Appendix not discretely identified. No pericecal
inflammatory/edematous change. Moderate proximal colonic fecal
material without dilatation. Few descending/sigmoid diverticula; no
adjacent inflammatory/edematous change or abscess.

Lymphatic: No abdominal or pelvic adenopathy.

Reproductive: Prostate enlargement.

Other: No ascites.  No free air.

Musculoskeletal: Laparoscopic clips from right inguinal hernia
repair. Atrophic or absent left rectus muscle. Degenerative disc
disease L2-S1. Right hip arthroplasty components. No fracture or
worrisome bone lesion.
IMPRESSION: 1. 5 cm abdominal aortic aneurysm. Recommend followup by abdomen and
pelvis CTA in 3-6 months, and vascular surgery referral/consultation
if not already obtained. This recommendation follows ACR consensus
guidelines: White Paper of the ACR Incidental Findings Committee II
on Vascular Findings. [HOSPITAL] 3569; [DATE].
2. Bilateral common iliac artery aneurysms, 2.5 cm right, 2.8 cm
left.
3. Bilateral internal iliac artery aneurysms, 2 cm right, 2.7 cm
left.

## 2020-07-16 ENCOUNTER — Ambulatory Visit: Payer: Medicare Other | Admitting: Cardiology

## 2020-08-03 ENCOUNTER — Ambulatory Visit: Payer: Medicare Other | Admitting: Cardiology

## 2020-08-07 DIAGNOSIS — D696 Thrombocytopenia, unspecified: Secondary | ICD-10-CM | POA: Diagnosis not present

## 2020-08-07 DIAGNOSIS — Z0000 Encounter for general adult medical examination without abnormal findings: Secondary | ICD-10-CM | POA: Diagnosis not present

## 2020-08-07 DIAGNOSIS — E7800 Pure hypercholesterolemia, unspecified: Secondary | ICD-10-CM | POA: Diagnosis not present

## 2020-08-07 DIAGNOSIS — Z9889 Other specified postprocedural states: Secondary | ICD-10-CM | POA: Diagnosis not present

## 2020-09-01 DIAGNOSIS — Z0120 Encounter for dental examination and cleaning without abnormal findings: Secondary | ICD-10-CM | POA: Diagnosis not present

## 2020-10-12 DIAGNOSIS — D692 Other nonthrombocytopenic purpura: Secondary | ICD-10-CM | POA: Diagnosis not present

## 2020-10-12 DIAGNOSIS — L821 Other seborrheic keratosis: Secondary | ICD-10-CM | POA: Diagnosis not present

## 2020-10-12 DIAGNOSIS — L812 Freckles: Secondary | ICD-10-CM | POA: Diagnosis not present

## 2020-10-12 DIAGNOSIS — D1801 Hemangioma of skin and subcutaneous tissue: Secondary | ICD-10-CM | POA: Diagnosis not present

## 2021-02-22 DIAGNOSIS — H52223 Regular astigmatism, bilateral: Secondary | ICD-10-CM | POA: Diagnosis not present

## 2021-04-28 ENCOUNTER — Other Ambulatory Visit: Payer: Self-pay | Admitting: *Deleted

## 2021-04-28 DIAGNOSIS — I714 Abdominal aortic aneurysm, without rupture, unspecified: Secondary | ICD-10-CM

## 2021-06-07 NOTE — Progress Notes (Signed)
[facility] [place]  ASSESSMENT / PLAN: James Estrada is a 80 y.o. male status post EVAR for rapidly enlarging infrarenal abdominal aortic aneurysm 05/04/20. Patient has known left internal iliac artery aneurysm which will need to be observed going forward. Check a CTA chest / abdomen / pelvis prior to next evaluation. If no interval change in iliac aneurysms, will observe with periodic CTA as long as renal function is acceptable. Will check a popliteal duplex at next visit as well. Follow up with me in 1 year.  CHIEF COMPLAINT: EVAR surveillance  HISTORY OF PRESENT ILLNESS: James Estrada is a 80 y.o. male status post EVAR 05/04/20 with Dr. Arbie Cookey.  He did very well from this.  He has had no trouble since surgery.  No abdominal complaints.  We reviewed his most recent surveillance duplex today.  We had a 10-minute conversation about the natural history of aneurysm disease and the rationale for surveillance.  VASCULAR SURGICAL HISTORY: EVAR 05/04/2020  VASCULAR RISK FACTORS: Negative history of cerebrovascular disease / stroke / transient ischemic attack. Negative history of coronary artery disease.  Negative history of diabetes mellitus.  Negative history of smoking.  Positive history of hypertension.  Negative history of chronic kidney disease. Negative history of chronic obstructive pulmonary disease.  AMBULATORY STATUS: Ambulatory within the community without limits  Past Medical History:  Diagnosis Date   AAA (abdominal aortic aneurysm) (HCC)    last u/s done 07/18/17    Arthritis    Bradycardia    Dysrhythmia    frequent PAC, for 20 years   GERD (gastroesophageal reflux disease)    occ, OTC   Hemorrhoids    History of hiatal hernia    Hyperlipidemia    Hypertension    Seasonal allergies     Past Surgical History:  Procedure Laterality Date   ABDOMINAL AORTIC ENDOVASCULAR STENT GRAFT N/A 05/04/2020   Procedure: ABDOMINAL AORTIC ENDOVASCULAR STENT  GRAFT;  Surgeon: Larina Earthly, MD;  Location[facility] OR;  Service: Vascular;  Laterality: N/A;   CATARACT EXTRACTION Bilateral 2009   DIAGNOSTIC LAPAROSCOPY     laparoscopic hernia repair   EYE SURGERY Bilateral    cataract removal   HERNIA REPAIR Bilateral 1999, 2006   JOINT REPLACEMENT Right ~2018   hip replacement   pheochromocytoma  1993   PROSTATECTOMY N/A 05/15/2013   Procedure: PROSTATECTOMY RETROPUBIC; SIMPLE OPEN PROSTATECTOMY;  Surgeon: Valetta Fuller, MD;  Location: [facility] ORS;  Service: Urology;  Laterality: N/A;   TOTAL ELBOW REPLACEMENT Left    > 30 years ago   TOTAL HIP ARTHROPLASTY Right 09/08/2017   Procedure: RIGHT TOTAL HIP ARTHROPLASTY ANTERIOR APPROACH;  Surgeon: Kathryne Hitch, MD;  Location: [facility] ORS;  Service: Orthopedics;  Laterality: Right;   ULTRASOUND GUIDANCE FOR VASCULAR ACCESS Bilateral 05/04/2020   Procedure: ULTRASOUND GUIDANCE FOR VASCULAR ACCESS;  Surgeon: Larina Earthly, MD;  Location: [facility] OR;  Service: Vascular;  Laterality: Bilateral;    Family History  Problem Relation Age of Onset   Heart disease Mother    Coronary artery disease Mother    Aneurysm Father    Diabetes Sister     Social History   Socioeconomic History   Marital status: Married    Spouse name: Not on file   Number of children: Not on file   Years of education: Not on file   Highest education level: Not on file  Occupational History   Not on file  Tobacco Use   Smoking  status: Never   Smokeless tobacco: Never  Vaping Use   Vaping Use: Never used  Substance and Sexual Activity   Alcohol use: Yes    Comment: 2 or 3 drinks on weekends   Drug use: No   Sexual activity: Not on file  Other Topics Concern   Not on file  Social History Narrative   Not on file   Social Determinants of Health   Financial Resource Strain: Not on file  Food Insecurity: Not on file  Transportation Needs: Not on file  Physical Activity: Not on file  Stress: Not on file  Social Connections:  Not on file  Intimate Partner Violence: Not on file    No Known Allergies  Current Outpatient Medications  Medication Sig Dispense Refill   aspirin EC 81 MG tablet Take 1 tablet (81 mg total) by mouth daily.     fluocinonide cream (LIDEX) 0.05 % Apply 1 application topically 2 (two) times daily as needed (ezcema flares).     simvastatin (ZOCOR) 20 MG tablet Take 1 tablet (20 mg total) by mouth daily at 6 PM. 30 tablet 3   No current facility-administered medications for this visit.    REVIEW OF SYSTEMS:  [X]  denotes positive finding, [ ]  denotes negative finding Cardiac  Comments:  Chest pain or chest pressure:    Shortness of breath upon exertion:    Short of breath when lying flat:    Irregular heart rhythm:        Vascular    Pain in calf, thigh, or hip brought on by ambulation:    Pain in feet at night that wakes you up from your sleep:     Blood clot in your veins:    Leg swelling:         Pulmonary    Oxygen at home:    Productive cough:     Wheezing:         Neurologic    Sudden weakness in arms or legs:     Sudden numbness in arms or legs:     Sudden onset of difficulty speaking or slurred speech:    Temporary loss of vision in one eye:     Problems with dizziness:         Gastrointestinal    Blood in stool:     Vomited blood:         Genitourinary    Burning when urinating:     Blood in urine:        Psychiatric    Major depression:         Hematologic    Bleeding problems:    Problems with blood clotting too easily:        Skin    Rashes or ulcers:        Constitutional    Fever or chills:      PHYSICAL EXAM Vitals:   06/08/21 0901  BP: (!) 147/87  Pulse: (!) 47  Resp: 20  Temp: 98.3 F (36.8 C)  SpO2: 98%  Weight: 179 lb (81.2 kg)  Height: 6' (1.829 m)    Constitutional: well appearing. no distress. Appears well nourished.  Neurologic: CN intact. no focal findings. no sensory loss. Psychiatric:  Mood and affect symmetric and  appropriate. Eyes:  No icterus. No conjunctival pallor. Ears, nose, throat:  mucous membranes moist. Midline trachea.  Cardiac: regular rate and rhythm.  Respiratory:  unlabored. Abdominal:  soft, non-tender, non-distended.  Peripheral vascular: 2+ radial pulses; 2+  popliteal pulses; 2+ DP pulses Extremity: no edema. no cyanosis. no pallor.  Skin: no gangrene. no ulceration.  Lymphatic: no Stemmer's sign. no palpable lymphadenopathy.  PERTINENT LABORATORY AND RADIOLOGIC DATA  Most recent CBC CBC Latest Ref Rng & Units 05/28/2020 05/05/2020 05/04/2020  WBC 3.4 - 10.8 x10E3/uL 7.9 13.0(H) 7.6  Hemoglobin 13.0 - 17.7 g/dL 16.1 09.6 04.5  Hematocrit 37.5 - 51.0 % 41.5 41.7 45.3  Platelets 150 - 450 x10E3/uL 151 91(L) 98(L)     Most recent CMP CMP Latest Ref Rng & Units 05/28/2020 05/05/2020 05/04/2020  Glucose 70 - 99 mg/dL - 409(W) 119(J)  BUN 8 - 23 mg/dL - 14 17  Creatinine 4.78 - 1.24 mg/dL - 2.95 6.21  Sodium 308 - 145 mmol/L - 138 141  Potassium 3.5 - 5.1 mmol/L - 3.7 4.3  Chloride 98 - 111 mmol/L - 106 110  CO2 22 - 32 mmol/L - 23 24  Calcium 8.9 - 10.3 mg/dL - 8.6(L) 8.8(L)  Total Protein 6.0 - 8.5 g/dL 6.3 - -  Total Bilirubin 0.0 - 1.2 mg/dL 0.6 - -  Alkaline Phos 48 - 121 IU/L 70 - -  AST 0 - 40 IU/L 15 - -  ALT 0 - 44 IU/L 13 - -    Renal function CrCl cannot be calculated (Patient's most recent lab result is older than the maximum 21 days allowed.).  No results found for: HGBA1C  LDL Chol Calc (NIH)  Date Value Ref Range Status  05/28/2020 87 0 - 99 mg/dL Final     Vascular Imaging: EVAR Duplex 06/08/21 Regression of sac to 3.9cm RIIA 2.5cm LIA 3.0cm  Rande Brunt. Lenell Antu, MD [facility] Phone Number: [phone] 06/07/2021 9:04 PM

## 2021-06-08 ENCOUNTER — Encounter: Payer: Self-pay | Admitting: Vascular Surgery

## 2021-06-08 ENCOUNTER — Ambulatory Visit: Payer: Medicare Other | Admitting: Vascular Surgery

## 2021-06-08 ENCOUNTER — Other Ambulatory Visit: Payer: Self-pay

## 2021-06-08 ENCOUNTER — Ambulatory Visit (HOSPITAL_COMMUNITY)
Admission: RE | Admit: 2021-06-08 | Discharge: 2021-06-08 | Disposition: A | Discharge status: Home / Self Care | Payer: Medicare Other | Source: Ambulatory Visit | Attending: Vascular Surgery | Admitting: Vascular Surgery

## 2021-06-08 VITALS — BP 147/87 | HR 47 | Temp 98.3°F | Resp 20 | Ht 72.0 in | Wt 179.0 lb

## 2021-06-08 DIAGNOSIS — I714 Abdominal aortic aneurysm, without rupture, unspecified: Secondary | ICD-10-CM

## 2021-08-16 DIAGNOSIS — Z0000 Encounter for general adult medical examination without abnormal findings: Secondary | ICD-10-CM | POA: Diagnosis not present

## 2021-08-16 DIAGNOSIS — Z79899 Other long term (current) drug therapy: Secondary | ICD-10-CM | POA: Diagnosis not present

## 2021-08-16 DIAGNOSIS — E7800 Pure hypercholesterolemia, unspecified: Secondary | ICD-10-CM | POA: Diagnosis not present

## 2021-08-16 DIAGNOSIS — Z23 Encounter for immunization: Secondary | ICD-10-CM | POA: Diagnosis not present

## 2021-08-16 DIAGNOSIS — Z5181 Encounter for therapeutic drug level monitoring: Secondary | ICD-10-CM | POA: Diagnosis not present

## 2021-08-16 DIAGNOSIS — D696 Thrombocytopenia, unspecified: Secondary | ICD-10-CM | POA: Diagnosis not present

## 2021-10-19 DIAGNOSIS — L812 Freckles: Secondary | ICD-10-CM | POA: Diagnosis not present

## 2021-10-19 DIAGNOSIS — D225 Melanocytic nevi of trunk: Secondary | ICD-10-CM | POA: Diagnosis not present

## 2021-10-19 DIAGNOSIS — L820 Inflamed seborrheic keratosis: Secondary | ICD-10-CM | POA: Diagnosis not present

## 2021-10-19 DIAGNOSIS — L821 Other seborrheic keratosis: Secondary | ICD-10-CM | POA: Diagnosis not present

## 2021-10-19 DIAGNOSIS — D1801 Hemangioma of skin and subcutaneous tissue: Secondary | ICD-10-CM | POA: Diagnosis not present

## 2022-05-22 DIAGNOSIS — I10 Essential (primary) hypertension: Secondary | ICD-10-CM | POA: Insufficient documentation

## 2022-05-22 DIAGNOSIS — K589 Irritable bowel syndrome without diarrhea: Secondary | ICD-10-CM | POA: Insufficient documentation

## 2022-05-22 DIAGNOSIS — K4090 Unilateral inguinal hernia, without obstruction or gangrene, not specified as recurrent: Secondary | ICD-10-CM | POA: Insufficient documentation

## 2022-05-22 DIAGNOSIS — K649 Unspecified hemorrhoids: Secondary | ICD-10-CM | POA: Insufficient documentation

## 2022-05-22 DIAGNOSIS — I723 Aneurysm of iliac artery: Secondary | ICD-10-CM

## 2022-05-22 DIAGNOSIS — E782 Mixed hyperlipidemia: Secondary | ICD-10-CM | POA: Insufficient documentation

## 2022-05-22 DIAGNOSIS — Z1211 Encounter for screening for malignant neoplasm of colon: Secondary | ICD-10-CM | POA: Insufficient documentation

## 2022-05-22 DIAGNOSIS — J309 Allergic rhinitis, unspecified: Secondary | ICD-10-CM | POA: Insufficient documentation

## 2022-05-22 DIAGNOSIS — D696 Thrombocytopenia, unspecified: Secondary | ICD-10-CM | POA: Insufficient documentation

## 2022-05-22 DIAGNOSIS — E7800 Pure hypercholesterolemia, unspecified: Secondary | ICD-10-CM | POA: Insufficient documentation

## 2022-05-22 HISTORY — DX: Aneurysm of iliac artery: I72.3

## 2022-05-27 ENCOUNTER — Other Ambulatory Visit: Payer: Self-pay

## 2022-05-27 DIAGNOSIS — I714 Abdominal aortic aneurysm, without rupture, unspecified: Secondary | ICD-10-CM

## 2022-06-10 ENCOUNTER — Other Ambulatory Visit: Payer: Self-pay

## 2022-06-10 DIAGNOSIS — I714 Abdominal aortic aneurysm, without rupture, unspecified: Secondary | ICD-10-CM

## 2022-06-13 ENCOUNTER — Other Ambulatory Visit: Payer: Self-pay | Admitting: *Deleted

## 2022-06-13 NOTE — Progress Notes (Signed)
Orders for US placed.  

## 2022-06-20 ENCOUNTER — Other Ambulatory Visit: Payer: Self-pay | Admitting: Vascular Surgery

## 2022-06-20 DIAGNOSIS — I714 Abdominal aortic aneurysm, without rupture, unspecified: Secondary | ICD-10-CM

## 2022-07-12 ENCOUNTER — Encounter (HOSPITAL_COMMUNITY): Payer: Medicare Other

## 2022-07-12 ENCOUNTER — Ambulatory Visit: Payer: Medicare Other | Admitting: Vascular Surgery

## 2022-07-21 ENCOUNTER — Ambulatory Visit
Admission: RE | Admit: 2022-07-21 | Discharge: 2022-07-21 | Disposition: A | Discharge status: Home / Self Care | Payer: Medicare Other | Source: Ambulatory Visit | Attending: Vascular Surgery | Admitting: Vascular Surgery

## 2022-07-21 DIAGNOSIS — I714 Abdominal aortic aneurysm, without rupture, unspecified: Secondary | ICD-10-CM

## 2022-07-21 DIAGNOSIS — I723 Aneurysm of iliac artery: Secondary | ICD-10-CM | POA: Diagnosis not present

## 2022-07-21 DIAGNOSIS — K449 Diaphragmatic hernia without obstruction or gangrene: Secondary | ICD-10-CM | POA: Diagnosis not present

## 2022-07-21 DIAGNOSIS — K7689 Other specified diseases of liver: Secondary | ICD-10-CM | POA: Diagnosis not present

## 2022-07-21 DIAGNOSIS — I2510 Atherosclerotic heart disease of native coronary artery without angina pectoris: Secondary | ICD-10-CM | POA: Diagnosis not present

## 2022-07-21 MED ORDER — IOPAMIDOL (ISOVUE-370) INJECTION 76%
75.0000 mL | Freq: Once | INTRAVENOUS | Status: AC | PRN
  Administered 2022-07-21: 75 mL via INTRAVENOUS

## 2022-07-22 ENCOUNTER — Other Ambulatory Visit (HOSPITAL_COMMUNITY): Payer: Self-pay | Admitting: Interventional Radiology

## 2022-07-26 ENCOUNTER — Ambulatory Visit (INDEPENDENT_AMBULATORY_CARE_PROVIDER_SITE_OTHER)
Admission: RE | Admit: 2022-07-26 | Discharge: 2022-07-26 | Disposition: A | Discharge status: Home / Self Care | Payer: Medicare Other | Source: Ambulatory Visit | Attending: Vascular Surgery | Admitting: Vascular Surgery

## 2022-07-26 ENCOUNTER — Ambulatory Visit: Payer: Medicare Other | Admitting: Vascular Surgery

## 2022-07-26 ENCOUNTER — Ambulatory Visit (HOSPITAL_COMMUNITY)
Admission: RE | Admit: 2022-07-26 | Discharge: 2022-07-26 | Disposition: A | Discharge status: Home / Self Care | Payer: Medicare Other | Source: Ambulatory Visit | Attending: Vascular Surgery | Admitting: Vascular Surgery

## 2022-07-26 ENCOUNTER — Encounter: Payer: Self-pay | Admitting: Vascular Surgery

## 2022-07-26 VITALS — BP 134/81 | HR 57 | Temp 99.0°F | Resp 20 | Ht 72.0 in | Wt 183.7 lb

## 2022-07-26 DIAGNOSIS — I714 Abdominal aortic aneurysm, without rupture, unspecified: Secondary | ICD-10-CM

## 2022-07-26 DIAGNOSIS — I723 Aneurysm of iliac artery: Secondary | ICD-10-CM | POA: Diagnosis not present

## 2022-07-26 NOTE — Progress Notes (Signed)
[facility] [place]  ASSESSMENT / PLAN: James Estrada is a 81 y.o. male status post EVAR for rapidly enlarging infrarenal abdominal aortic aneurysm 05/04/20. Patient has known left internal iliac artery aneurysm which will need to be observed going forward. Check a CTA abdomen / pelvis in one year. No evidence of popliteal artery aneurysm.  CHIEF COMPLAINT: EVAR surveillance  HISTORY OF PRESENT ILLNESS: James Estrada is a 82 y.o. male status post EVAR 05/04/20 with Dr. Arbie Cookey.  He did very well from this.  He has had no trouble since surgery.  No abdominal complaints.  We reviewed his most recent surveillance duplex today.  We had a 10-minute conversation about the natural history of aneurysm disease and the rationale for surveillance.  07/26/22: returns for surveillance. We reviewed his scans. No new symptoms or concerns.   VASCULAR SURGICAL HISTORY: EVAR 05/04/2020  VASCULAR RISK FACTORS: Negative history of cerebrovascular disease / stroke / transient ischemic attack. Negative history of coronary artery disease.  Negative history of diabetes mellitus.  Negative history of smoking.  Positive history of hypertension.  Negative history of chronic kidney disease. Negative history of chronic obstructive pulmonary disease.  AMBULATORY STATUS: Ambulatory within the community without limits  Past Medical History:  Diagnosis Date   AAA (abdominal aortic aneurysm) (HCC)    last u/s done 07/18/17    Arthritis    Bradycardia    Dysrhythmia    frequent PAC, for 20 years   GERD (gastroesophageal reflux disease)    occ, OTC   Hemorrhoids    History of hiatal hernia    Hyperlipidemia    Hypertension    Seasonal allergies     Past Surgical History:  Procedure Laterality Date   ABDOMINAL AORTIC ENDOVASCULAR STENT GRAFT N/A 05/04/2020   Procedure: ABDOMINAL AORTIC ENDOVASCULAR STENT GRAFT;  Surgeon: Larina Earthly, MD;  Location: [facility] OR;  Service: Vascular;   Laterality: N/A;   CATARACT EXTRACTION Bilateral 2009   DIAGNOSTIC LAPAROSCOPY     laparoscopic hernia repair   EYE SURGERY Bilateral    cataract removal   HERNIA REPAIR Bilateral 1999, 2006   JOINT REPLACEMENT Right ~2018   hip replacement   pheochromocytoma  1993   PROSTATECTOMY N/A 05/15/2013   Procedure: PROSTATECTOMY RETROPUBIC; SIMPLE OPEN PROSTATECTOMY;  Surgeon: Valetta Fuller, MD;  Location: [facility] ORS;  Service: Urology;  Laterality: N/A;   TOTAL ELBOW REPLACEMENT Left    > 30 years ago   TOTAL HIP ARTHROPLASTY Right 09/08/2017   Procedure: RIGHT TOTAL HIP ARTHROPLASTY ANTERIOR APPROACH;  Surgeon: Kathryne Hitch, MD;  Location: [facility] ORS;  Service: Orthopedics;  Laterality: Right;   ULTRASOUND GUIDANCE FOR VASCULAR ACCESS Bilateral 05/04/2020   Procedure: ULTRASOUND GUIDANCE FOR VASCULAR ACCESS;  Surgeon: Larina Earthly, MD;  Location: [facility] OR;  Service: Vascular;  Laterality: Bilateral;    Family History  Problem Relation Age of Onset   Heart disease Mother    Coronary artery disease Mother    Aneurysm Father    Diabetes Sister     Social History   Socioeconomic History   Marital status: Married    Spouse name: Not on file   Number of children: Not on file   Years of education: Not on file   Highest education level: Not on file  Occupational History   Not on file  Tobacco Use   Smoking status: Never   Smokeless tobacco: Never  Vaping Use   Vaping Use: Never used  Substance  and Sexual Activity   Alcohol use: Yes    Comment: 2 or 3 drinks on weekends   Drug use: No   Sexual activity: Not on file  Other Topics Concern   Not on file  Social History Narrative   Not on file   Social Determinants of Health   Financial Resource Strain: Not on file  Food Insecurity: Not on file  Transportation Needs: Not on file  Physical Activity: Not on file  Stress: Not on file  Social Connections: Not on file  Intimate Partner Violence: Not on file    No Known  Allergies  Current Outpatient Medications  Medication Sig Dispense Refill   fluocinonide cream (LIDEX) 0.05 % Apply 1 application  topically 2 (two) times daily as needed (ezcema flares).     No current facility-administered medications for this visit.    REVIEW OF SYSTEMS:  [X]  denotes positive finding, [ ]  denotes negative finding Cardiac  Comments:  Chest pain or chest pressure:    Shortness of breath upon exertion:    Short of breath when lying flat:    Irregular heart rhythm:        Vascular    Pain in calf, thigh, or hip brought on by ambulation:    Pain in feet at night that wakes you up from your sleep:     Blood clot in your veins:    Leg swelling:         Pulmonary    Oxygen at home:    Productive cough:     Wheezing:         Neurologic    Sudden weakness in arms or legs:     Sudden numbness in arms or legs:     Sudden onset of difficulty speaking or slurred speech:    Temporary loss of vision in one eye:     Problems with dizziness:         Gastrointestinal    Blood in stool:     Vomited blood:         Genitourinary    Burning when urinating:     Blood in urine:        Psychiatric    Major depression:         Hematologic    Bleeding problems:    Problems with blood clotting too easily:        Skin    Rashes or ulcers:        Constitutional    Fever or chills:      PHYSICAL EXAM Vitals:   07/26/22 1302  BP: 134/81  Pulse: (!) 57  Resp: 20  Temp: 99 F (37.2 C)  SpO2: 95%  Weight: 183 lb 11.2 oz (83.3 kg)  Height: 6' (1.829 m)    Constitutional: well appearing. no distress. Appears well nourished.  Neurologic: CN intact. no focal findings. no sensory loss. Psychiatric:  Mood and affect symmetric and appropriate. Eyes:  No icterus. No conjunctival pallor. Ears, nose, throat:  mucous membranes moist. Midline trachea.  Cardiac: regular rate and rhythm.  Respiratory:  unlabored. Abdominal:  soft, non-tender, non-distended.  Peripheral  vascular: 2+ radial pulses; 2+ popliteal pulses; 2+ DP pulses Extremity: no edema. no cyanosis. no pallor.  Skin: no gangrene. no ulceration.  Lymphatic: no Stemmer's sign. no palpable lymphadenopathy.  PERTINENT LABORATORY AND RADIOLOGIC DATA  Most recent CBC    Latest Ref Rng & Units 05/28/2020    8:19 AM 05/05/2020    4:30 AM  05/04/2020   10:54 AM  CBC  WBC 3.4 - 10.8 x10E3/uL 7.9  13.0  7.6   Hemoglobin 13.0 - 17.7 g/dL 17.9  15.0  56.9   Hematocrit 37.5 - 51.0 % 41.5  41.7  45.3   Platelets 150 - 450 x10E3/uL 151  91  98      Most recent CMP    Latest Ref Rng & Units 05/28/2020    8:19 AM 05/05/2020    4:30 AM 05/04/2020   10:54 AM  CMP  Glucose 70 - 99 mg/dL  794  801   BUN 8 - 23 mg/dL  14  17   Creatinine 6.55 - 1.24 mg/dL  3.74  8.27   Sodium 078 - 145 mmol/L  138  141   Potassium 3.5 - 5.1 mmol/L  3.7  4.3   Chloride 98 - 111 mmol/L  106  110   CO2 22 - 32 mmol/L  23  24   Calcium 8.9 - 10.3 mg/dL  8.6  8.8   Total Protein 6.0 - 8.5 g/dL 6.3     Total Bilirubin 0.0 - 1.2 mg/dL 0.6     Alkaline Phos 48 - 121 IU/L 70     AST 0 - 40 IU/L 15     ALT 0 - 44 IU/L 13       Renal function CrCl cannot be calculated (Patient's most recent lab result is older than the maximum 21 days allowed.).  No results found for: "HGBA1C"  LDL Chol Calc (NIH)  Date Value Ref Range Status  05/28/2020 87 0 - 99 mg/dL Final     Vascular Imaging: CT angiogram reviewed in detail. Good seal in EVAR. Sac 3.7cm RIIA 2.5cm LIIA 3.0cm  Rande Brunt. Lenell Antu, MD [facility] Phone Number: [phone] 07/26/2022 4:10 PM

## 2022-09-17 ENCOUNTER — Inpatient Hospital Stay (HOSPITAL_COMMUNITY)
Admission: EM | Admit: 2022-09-17 | Discharge: 2022-10-04 | DRG: 252 | Disposition: A | Discharge status: Rehabilitation Facility | Payer: Medicare Other | Attending: Thoracic Surgery (Cardiothoracic Vascular Surgery) | Admitting: Thoracic Surgery (Cardiothoracic Vascular Surgery)

## 2022-09-17 ENCOUNTER — Emergency Department (HOSPITAL_COMMUNITY): Payer: Medicare Other

## 2022-09-17 ENCOUNTER — Encounter (HOSPITAL_COMMUNITY)
Admission: EM | Disposition: A | Discharge status: Rehabilitation Facility | Payer: Self-pay | Source: Home / Self Care | Attending: Thoracic Surgery (Cardiothoracic Vascular Surgery)

## 2022-09-17 ENCOUNTER — Inpatient Hospital Stay (HOSPITAL_COMMUNITY): Payer: Medicare Other | Admitting: Certified Registered Nurse Anesthetist

## 2022-09-17 ENCOUNTER — Encounter (HOSPITAL_COMMUNITY): Payer: Self-pay | Admitting: Thoracic Surgery (Cardiothoracic Vascular Surgery)

## 2022-09-17 ENCOUNTER — Inpatient Hospital Stay (HOSPITAL_COMMUNITY): Payer: Medicare Other

## 2022-09-17 DIAGNOSIS — I119 Hypertensive heart disease without heart failure: Secondary | ICD-10-CM

## 2022-09-17 DIAGNOSIS — J189 Pneumonia, unspecified organism: Secondary | ICD-10-CM | POA: Diagnosis not present

## 2022-09-17 DIAGNOSIS — I97821 Postprocedural cerebrovascular infarction during other surgery: Secondary | ICD-10-CM | POA: Diagnosis not present

## 2022-09-17 DIAGNOSIS — J9811 Atelectasis: Secondary | ICD-10-CM | POA: Diagnosis not present

## 2022-09-17 DIAGNOSIS — K5900 Constipation, unspecified: Secondary | ICD-10-CM | POA: Diagnosis not present

## 2022-09-17 DIAGNOSIS — A419 Sepsis, unspecified organism: Secondary | ICD-10-CM

## 2022-09-17 DIAGNOSIS — I701 Atherosclerosis of renal artery: Secondary | ICD-10-CM | POA: Diagnosis not present

## 2022-09-17 DIAGNOSIS — Z9841 Cataract extraction status, right eye: Secondary | ICD-10-CM

## 2022-09-17 DIAGNOSIS — I4891 Unspecified atrial fibrillation: Secondary | ICD-10-CM | POA: Diagnosis not present

## 2022-09-17 DIAGNOSIS — M1990 Unspecified osteoarthritis, unspecified site: Secondary | ICD-10-CM

## 2022-09-17 DIAGNOSIS — D689 Coagulation defect, unspecified: Secondary | ICD-10-CM | POA: Diagnosis present

## 2022-09-17 DIAGNOSIS — D696 Thrombocytopenia, unspecified: Secondary | ICD-10-CM | POA: Diagnosis present

## 2022-09-17 DIAGNOSIS — K449 Diaphragmatic hernia without obstruction or gangrene: Secondary | ICD-10-CM | POA: Diagnosis not present

## 2022-09-17 DIAGNOSIS — I351 Nonrheumatic aortic (valve) insufficiency: Secondary | ICD-10-CM | POA: Diagnosis not present

## 2022-09-17 DIAGNOSIS — E876 Hypokalemia: Secondary | ICD-10-CM | POA: Diagnosis not present

## 2022-09-17 DIAGNOSIS — Z8249 Family history of ischemic heart disease and other diseases of the circulatory system: Secondary | ICD-10-CM

## 2022-09-17 DIAGNOSIS — I63031 Cerebral infarction due to thrombosis of right carotid artery: Secondary | ICD-10-CM | POA: Diagnosis not present

## 2022-09-17 DIAGNOSIS — G8220 Paraplegia, unspecified: Secondary | ICD-10-CM | POA: Diagnosis not present

## 2022-09-17 DIAGNOSIS — I69354 Hemiplegia and hemiparesis following cerebral infarction affecting left non-dominant side: Secondary | ICD-10-CM | POA: Diagnosis not present

## 2022-09-17 DIAGNOSIS — G9340 Encephalopathy, unspecified: Secondary | ICD-10-CM | POA: Diagnosis not present

## 2022-09-17 DIAGNOSIS — I3139 Other pericardial effusion (noninflammatory): Secondary | ICD-10-CM | POA: Diagnosis not present

## 2022-09-17 DIAGNOSIS — R0902 Hypoxemia: Secondary | ICD-10-CM | POA: Diagnosis not present

## 2022-09-17 DIAGNOSIS — J811 Chronic pulmonary edema: Secondary | ICD-10-CM | POA: Diagnosis not present

## 2022-09-17 DIAGNOSIS — I7100 Dissection of unspecified site of aorta: Secondary | ICD-10-CM | POA: Diagnosis not present

## 2022-09-17 DIAGNOSIS — I63441 Cerebral infarction due to embolism of right cerebellar artery: Secondary | ICD-10-CM | POA: Diagnosis not present

## 2022-09-17 DIAGNOSIS — I480 Paroxysmal atrial fibrillation: Secondary | ICD-10-CM | POA: Diagnosis not present

## 2022-09-17 DIAGNOSIS — R197 Diarrhea, unspecified: Secondary | ICD-10-CM | POA: Diagnosis not present

## 2022-09-17 DIAGNOSIS — I63511 Cerebral infarction due to unspecified occlusion or stenosis of right middle cerebral artery: Secondary | ICD-10-CM | POA: Diagnosis not present

## 2022-09-17 DIAGNOSIS — I71019 Dissection of thoracic aorta, unspecified: Secondary | ICD-10-CM | POA: Diagnosis not present

## 2022-09-17 DIAGNOSIS — I739 Peripheral vascular disease, unspecified: Secondary | ICD-10-CM

## 2022-09-17 DIAGNOSIS — Z9889 Other specified postprocedural states: Secondary | ICD-10-CM

## 2022-09-17 DIAGNOSIS — M549 Dorsalgia, unspecified: Secondary | ICD-10-CM | POA: Diagnosis not present

## 2022-09-17 DIAGNOSIS — G8194 Hemiplegia, unspecified affecting left nondominant side: Secondary | ICD-10-CM | POA: Diagnosis not present

## 2022-09-17 DIAGNOSIS — G936 Cerebral edema: Secondary | ICD-10-CM | POA: Diagnosis not present

## 2022-09-17 DIAGNOSIS — Z833 Family history of diabetes mellitus: Secondary | ICD-10-CM

## 2022-09-17 DIAGNOSIS — E785 Hyperlipidemia, unspecified: Secondary | ICD-10-CM | POA: Diagnosis present

## 2022-09-17 DIAGNOSIS — G9511 Acute infarction of spinal cord (embolic) (nonembolic): Secondary | ICD-10-CM | POA: Diagnosis not present

## 2022-09-17 DIAGNOSIS — I672 Cerebral atherosclerosis: Secondary | ICD-10-CM | POA: Diagnosis not present

## 2022-09-17 DIAGNOSIS — I69320 Aphasia following cerebral infarction: Secondary | ICD-10-CM | POA: Diagnosis not present

## 2022-09-17 DIAGNOSIS — I6601 Occlusion and stenosis of right middle cerebral artery: Secondary | ICD-10-CM | POA: Diagnosis not present

## 2022-09-17 DIAGNOSIS — Z6824 Body mass index (BMI) 24.0-24.9, adult: Secondary | ICD-10-CM

## 2022-09-17 DIAGNOSIS — I611 Nontraumatic intracerebral hemorrhage in hemisphere, cortical: Secondary | ICD-10-CM | POA: Diagnosis not present

## 2022-09-17 DIAGNOSIS — Z7982 Long term (current) use of aspirin: Secondary | ICD-10-CM

## 2022-09-17 DIAGNOSIS — I5032 Chronic diastolic (congestive) heart failure: Secondary | ICD-10-CM | POA: Diagnosis not present

## 2022-09-17 DIAGNOSIS — R4182 Altered mental status, unspecified: Secondary | ICD-10-CM | POA: Diagnosis not present

## 2022-09-17 DIAGNOSIS — E46 Unspecified protein-calorie malnutrition: Secondary | ICD-10-CM | POA: Diagnosis not present

## 2022-09-17 DIAGNOSIS — K219 Gastro-esophageal reflux disease without esophagitis: Secondary | ICD-10-CM | POA: Diagnosis present

## 2022-09-17 DIAGNOSIS — I6319 Cerebral infarction due to embolism of other precerebral artery: Secondary | ICD-10-CM | POA: Diagnosis not present

## 2022-09-17 DIAGNOSIS — I614 Nontraumatic intracerebral hemorrhage in cerebellum: Secondary | ICD-10-CM | POA: Diagnosis not present

## 2022-09-17 DIAGNOSIS — N170 Acute kidney failure with tubular necrosis: Secondary | ICD-10-CM | POA: Diagnosis not present

## 2022-09-17 DIAGNOSIS — I5030 Unspecified diastolic (congestive) heart failure: Secondary | ICD-10-CM | POA: Diagnosis not present

## 2022-09-17 DIAGNOSIS — I110 Hypertensive heart disease with heart failure: Secondary | ICD-10-CM | POA: Diagnosis present

## 2022-09-17 DIAGNOSIS — K683 Retroperitoneal hematoma: Secondary | ICD-10-CM | POA: Diagnosis not present

## 2022-09-17 DIAGNOSIS — J939 Pneumothorax, unspecified: Secondary | ICD-10-CM | POA: Diagnosis not present

## 2022-09-17 DIAGNOSIS — I10 Essential (primary) hypertension: Secondary | ICD-10-CM | POA: Diagnosis not present

## 2022-09-17 DIAGNOSIS — R58 Hemorrhage, not elsewhere classified: Secondary | ICD-10-CM

## 2022-09-17 DIAGNOSIS — G9341 Metabolic encephalopathy: Secondary | ICD-10-CM | POA: Diagnosis not present

## 2022-09-17 DIAGNOSIS — N320 Bladder-neck obstruction: Secondary | ICD-10-CM | POA: Diagnosis not present

## 2022-09-17 DIAGNOSIS — I7101 Dissection of ascending aorta: Secondary | ICD-10-CM | POA: Diagnosis not present

## 2022-09-17 DIAGNOSIS — D62 Acute posthemorrhagic anemia: Secondary | ICD-10-CM | POA: Diagnosis present

## 2022-09-17 DIAGNOSIS — I7103 Dissection of thoracoabdominal aorta: Principal | ICD-10-CM

## 2022-09-17 DIAGNOSIS — I5031 Acute diastolic (congestive) heart failure: Secondary | ICD-10-CM | POA: Diagnosis not present

## 2022-09-17 DIAGNOSIS — I63411 Cerebral infarction due to embolism of right middle cerebral artery: Secondary | ICD-10-CM | POA: Diagnosis not present

## 2022-09-17 DIAGNOSIS — R339 Retention of urine, unspecified: Secondary | ICD-10-CM | POA: Diagnosis not present

## 2022-09-17 DIAGNOSIS — Z781 Physical restraint status: Secondary | ICD-10-CM

## 2022-09-17 DIAGNOSIS — I6529 Occlusion and stenosis of unspecified carotid artery: Secondary | ICD-10-CM | POA: Diagnosis present

## 2022-09-17 DIAGNOSIS — R6520 Severe sepsis without septic shock: Secondary | ICD-10-CM | POA: Diagnosis not present

## 2022-09-17 DIAGNOSIS — Z8679 Personal history of other diseases of the circulatory system: Secondary | ICD-10-CM

## 2022-09-17 DIAGNOSIS — I6523 Occlusion and stenosis of bilateral carotid arteries: Secondary | ICD-10-CM | POA: Diagnosis not present

## 2022-09-17 DIAGNOSIS — R29810 Facial weakness: Secondary | ICD-10-CM | POA: Diagnosis not present

## 2022-09-17 DIAGNOSIS — N179 Acute kidney failure, unspecified: Secondary | ICD-10-CM | POA: Diagnosis not present

## 2022-09-17 DIAGNOSIS — R531 Weakness: Secondary | ICD-10-CM | POA: Diagnosis not present

## 2022-09-17 DIAGNOSIS — Z4682 Encounter for fitting and adjustment of non-vascular catheter: Secondary | ICD-10-CM | POA: Diagnosis not present

## 2022-09-17 DIAGNOSIS — I63521 Cerebral infarction due to unspecified occlusion or stenosis of right anterior cerebral artery: Secondary | ICD-10-CM | POA: Diagnosis not present

## 2022-09-17 DIAGNOSIS — R000 Tachycardia, unspecified: Secondary | ICD-10-CM | POA: Diagnosis not present

## 2022-09-17 DIAGNOSIS — Z9911 Dependence on respirator [ventilator] status: Secondary | ICD-10-CM | POA: Diagnosis not present

## 2022-09-17 DIAGNOSIS — Z23 Encounter for immunization: Secondary | ICD-10-CM | POA: Diagnosis not present

## 2022-09-17 DIAGNOSIS — K661 Hemoperitoneum: Secondary | ICD-10-CM | POA: Diagnosis not present

## 2022-09-17 DIAGNOSIS — R578 Other shock: Secondary | ICD-10-CM | POA: Diagnosis not present

## 2022-09-17 DIAGNOSIS — R451 Restlessness and agitation: Secondary | ICD-10-CM | POA: Diagnosis not present

## 2022-09-17 DIAGNOSIS — K648 Other hemorrhoids: Secondary | ICD-10-CM | POA: Diagnosis present

## 2022-09-17 DIAGNOSIS — G3189 Other specified degenerative diseases of nervous system: Secondary | ICD-10-CM | POA: Diagnosis present

## 2022-09-17 DIAGNOSIS — R109 Unspecified abdominal pain: Secondary | ICD-10-CM | POA: Diagnosis not present

## 2022-09-17 DIAGNOSIS — R1310 Dysphagia, unspecified: Secondary | ICD-10-CM | POA: Diagnosis not present

## 2022-09-17 DIAGNOSIS — Z96641 Presence of right artificial hip joint: Secondary | ICD-10-CM | POA: Diagnosis present

## 2022-09-17 DIAGNOSIS — R4701 Aphasia: Secondary | ICD-10-CM | POA: Diagnosis present

## 2022-09-17 DIAGNOSIS — J90 Pleural effusion, not elsewhere classified: Secondary | ICD-10-CM | POA: Diagnosis not present

## 2022-09-17 DIAGNOSIS — Z79899 Other long term (current) drug therapy: Secondary | ICD-10-CM | POA: Diagnosis not present

## 2022-09-17 DIAGNOSIS — H518 Other specified disorders of binocular movement: Secondary | ICD-10-CM | POA: Diagnosis present

## 2022-09-17 DIAGNOSIS — I312 Hemopericardium, not elsewhere classified: Secondary | ICD-10-CM | POA: Diagnosis present

## 2022-09-17 DIAGNOSIS — R29705 NIHSS score 5: Secondary | ICD-10-CM | POA: Diagnosis not present

## 2022-09-17 DIAGNOSIS — I723 Aneurysm of iliac artery: Secondary | ICD-10-CM | POA: Diagnosis present

## 2022-09-17 DIAGNOSIS — J9601 Acute respiratory failure with hypoxia: Secondary | ICD-10-CM | POA: Diagnosis not present

## 2022-09-17 DIAGNOSIS — I6310 Cerebral infarction due to embolism of unspecified precerebral artery: Secondary | ICD-10-CM | POA: Diagnosis not present

## 2022-09-17 DIAGNOSIS — D72829 Elevated white blood cell count, unspecified: Secondary | ICD-10-CM | POA: Diagnosis not present

## 2022-09-17 DIAGNOSIS — I639 Cerebral infarction, unspecified: Secondary | ICD-10-CM | POA: Diagnosis not present

## 2022-09-17 DIAGNOSIS — Y95 Nosocomial condition: Secondary | ICD-10-CM | POA: Diagnosis present

## 2022-09-17 DIAGNOSIS — E870 Hyperosmolality and hypernatremia: Secondary | ICD-10-CM | POA: Diagnosis not present

## 2022-09-17 DIAGNOSIS — R29818 Other symptoms and signs involving the nervous system: Secondary | ICD-10-CM | POA: Diagnosis not present

## 2022-09-17 DIAGNOSIS — I6340 Cerebral infarction due to embolism of unspecified cerebral artery: Secondary | ICD-10-CM | POA: Diagnosis not present

## 2022-09-17 DIAGNOSIS — R739 Hyperglycemia, unspecified: Secondary | ICD-10-CM | POA: Diagnosis not present

## 2022-09-17 DIAGNOSIS — D72823 Leukemoid reaction: Secondary | ICD-10-CM | POA: Diagnosis present

## 2022-09-17 DIAGNOSIS — Z9842 Cataract extraction status, left eye: Secondary | ICD-10-CM

## 2022-09-17 DIAGNOSIS — R29898 Other symptoms and signs involving the musculoskeletal system: Secondary | ICD-10-CM

## 2022-09-17 DIAGNOSIS — I619 Nontraumatic intracerebral hemorrhage, unspecified: Secondary | ICD-10-CM | POA: Diagnosis not present

## 2022-09-17 HISTORY — PX: REPAIR OF ACUTE ASCENDING THORACIC AORTIC DISSECTION: SHX6323

## 2022-09-17 LAB — COMPREHENSIVE METABOLIC PANEL
ALT: 13 U/L (ref 0–44)
AST: 20 U/L (ref 15–41)
Albumin: 3.4 g/dL — ABNORMAL LOW (ref 3.5–5.0)
Alkaline Phosphatase: 45 U/L (ref 38–126)
Anion gap: 11 (ref 5–15)
BUN: 21 mg/dL (ref 8–23)
CO2: 23 mmol/L (ref 22–32)
Calcium: 8.7 mg/dL — ABNORMAL LOW (ref 8.9–10.3)
Chloride: 105 mmol/L (ref 98–111)
Creatinine, Ser: 1.3 mg/dL — ABNORMAL HIGH (ref 0.61–1.24)
GFR, Estimated: 56 mL/min — ABNORMAL LOW (ref >60)
Glucose, Bld: 156 mg/dL — ABNORMAL HIGH (ref 70–99)
Potassium: 3.5 mmol/L (ref 3.5–5.1)
Sodium: 139 mmol/L (ref 135–145)
Total Bilirubin: 0.8 mg/dL (ref 0.3–1.2)
Total Protein: 5.6 g/dL — ABNORMAL LOW (ref 6.5–8.1)

## 2022-09-17 LAB — CBC
HCT: 43 % (ref 39.0–52.0)
Hemoglobin: 13.8 g/dL (ref 13.0–17.0)
MCH: 28.1 pg (ref 26.0–34.0)
MCHC: 32.1 g/dL (ref 30.0–36.0)
MCV: 87.6 fL (ref 80.0–100.0)
Platelets: 115 10*3/uL — ABNORMAL LOW (ref 150–400)
RBC: 4.91 MIL/uL (ref 4.22–5.81)
RDW: 12.8 % (ref 11.5–15.5)
WBC: 13.3 10*3/uL — ABNORMAL HIGH (ref 4.0–10.5)
nRBC: 0 % (ref 0.0–0.2)

## 2022-09-17 LAB — POCT I-STAT, CHEM 8
BUN: 24 mg/dL — ABNORMAL HIGH (ref 8–23)
Calcium, Ion: 1.13 mmol/L — ABNORMAL LOW (ref 1.15–1.40)
Chloride: 106 mmol/L (ref 98–111)
Creatinine, Ser: 1.2 mg/dL (ref 0.61–1.24)
Glucose, Bld: 166 mg/dL — ABNORMAL HIGH (ref 70–99)
HCT: 33 % — ABNORMAL LOW (ref 39.0–52.0)
Hemoglobin: 11.2 g/dL — ABNORMAL LOW (ref 13.0–17.0)
Potassium: 4.3 mmol/L (ref 3.5–5.1)
Sodium: 137 mmol/L (ref 135–145)
TCO2: 22 mmol/L (ref 22–32)

## 2022-09-17 LAB — DIFFERENTIAL
Abs Immature Granulocytes: 0.19 10*3/uL — ABNORMAL HIGH (ref 0.00–0.07)
Basophils Absolute: 0.1 10*3/uL (ref 0.0–0.1)
Basophils Relative: 1 %
Eosinophils Absolute: 0.2 10*3/uL (ref 0.0–0.5)
Eosinophils Relative: 1 %
Immature Granulocytes: 1 %
Lymphocytes Relative: 17 %
Lymphs Abs: 2.3 10*3/uL (ref 0.7–4.0)
Monocytes Absolute: 1.1 10*3/uL — ABNORMAL HIGH (ref 0.1–1.0)
Monocytes Relative: 8 %
Neutro Abs: 9.5 10*3/uL — ABNORMAL HIGH (ref 1.7–7.7)
Neutrophils Relative %: 72 %

## 2022-09-17 LAB — I-STAT CHEM 8, ED
BUN: 23 mg/dL (ref 8–23)
BUN: 22 mg/dL (ref 8–23)
Calcium, Ion: 1.15 mmol/L (ref 1.15–1.40)
Calcium, Ion: 1.09 mmol/L — ABNORMAL LOW (ref 1.15–1.40)
Chloride: 102 mmol/L (ref 98–111)
Chloride: 106 mmol/L (ref 98–111)
Creatinine, Ser: 1.2 mg/dL (ref 0.61–1.24)
Creatinine, Ser: 1.1 mg/dL (ref 0.61–1.24)
Glucose, Bld: 161 mg/dL — ABNORMAL HIGH (ref 70–99)
Glucose, Bld: 195 mg/dL — ABNORMAL HIGH (ref 70–99)
HCT: 41 % (ref 39.0–52.0)
HCT: 33 % — ABNORMAL LOW (ref 39.0–52.0)
Hemoglobin: 13.9 g/dL (ref 13.0–17.0)
Hemoglobin: 11.2 g/dL — ABNORMAL LOW (ref 13.0–17.0)
Potassium: 3.6 mmol/L (ref 3.5–5.1)
Potassium: 3.7 mmol/L (ref 3.5–5.1)
Sodium: 139 mmol/L (ref 135–145)
Sodium: 138 mmol/L (ref 135–145)
TCO2: 22 mmol/L (ref 22–32)
TCO2: 21 mmol/L — ABNORMAL LOW (ref 22–32)

## 2022-09-17 LAB — PROTIME-INR
INR: 1.2 (ref 0.8–1.2)
Prothrombin Time: 15.5 seconds — ABNORMAL HIGH (ref 11.4–15.2)

## 2022-09-17 LAB — ETHANOL: Alcohol, Ethyl (B): <10 mg/dL (ref <10)

## 2022-09-17 LAB — APTT: aPTT: 30 seconds (ref 24–36)

## 2022-09-17 LAB — TROPONIN I (HIGH SENSITIVITY): Troponin I (High Sensitivity): 7 ng/L (ref <18)

## 2022-09-17 LAB — PREPARE RBC (CROSSMATCH)

## 2022-09-17 SURGERY — REPAIR, AORTIC DISSECTION, ASCENDING
Anesthesia: General | Site: Chest

## 2022-09-17 MED ORDER — INSULIN REGULAR(HUMAN) IN NACL 100-0.9 UT/100ML-% IV SOLN
INTRAVENOUS | Status: AC
  Administered 2022-09-17: 2.6 [IU]/h via INTRAVENOUS
  Filled 2022-09-17: qty 100

## 2022-09-17 MED ORDER — FENTANYL CITRATE (PF) 250 MCG/5ML IJ SOLN
INTRAMUSCULAR | Status: AC
  Filled 2022-09-17: qty 5

## 2022-09-17 MED ORDER — HEMOSTATIC AGENTS (NO CHARGE) OPTIME
TOPICAL | Status: DC | PRN
  Administered 2022-09-17 – 2022-09-18 (×6): 1 via TOPICAL

## 2022-09-17 MED ORDER — AMIODARONE HCL IN DEXTROSE 360-4.14 MG/200ML-% IV SOLN
30.0000 mg/h | INTRAVENOUS | Status: DC
  Administered 2022-09-18 – 2022-09-23 (×8): 30 mg/h via INTRAVENOUS
  Filled 2022-09-17 (×12): qty 200

## 2022-09-17 MED ORDER — TRANEXAMIC ACID (OHS) PUMP PRIME SOLUTION
2.0000 mg/kg | INTRAVENOUS | Status: DC
  Filled 2022-09-17: qty 1.66

## 2022-09-17 MED ORDER — PROPOFOL 10 MG/ML IV BOLUS
INTRAVENOUS | Status: AC
  Filled 2022-09-17: qty 20

## 2022-09-17 MED ORDER — PLASMA-LYTE A IV SOLN
INTRAVENOUS | Status: DC
  Filled 2022-09-17: qty 2.5

## 2022-09-17 MED ORDER — SODIUM CHLORIDE 0.9% IV SOLUTION: Freq: Once | INTRAVENOUS | Status: DC

## 2022-09-17 MED ORDER — AMIODARONE LOAD VIA INFUSION
150.0000 mg | Freq: Once | INTRAVENOUS | Status: DC
  Filled 2022-09-17: qty 83.34

## 2022-09-17 MED ORDER — SUCCINYLCHOLINE CHLORIDE 200 MG/10ML IV SOSY
PREFILLED_SYRINGE | INTRAVENOUS | Status: DC | PRN
  Administered 2022-09-17: 140 mg via INTRAVENOUS

## 2022-09-17 MED ORDER — FENTANYL CITRATE PF 50 MCG/ML IJ SOSY
PREFILLED_SYRINGE | INTRAMUSCULAR | Status: AC
  Administered 2022-09-17: 25 ug via INTRAVENOUS
  Filled 2022-09-17: qty 1

## 2022-09-17 MED ORDER — LACTATED RINGERS IV SOLN: INTRAVENOUS | Status: DC | PRN

## 2022-09-17 MED ORDER — HEMOSTATIC AGENTS (NO CHARGE) OPTIME
TOPICAL | Status: DC | PRN
  Administered 2022-09-17 – 2022-09-18 (×8): 1 via TOPICAL

## 2022-09-17 MED ORDER — PLASMA-LYTE A IV SOLN
INTRAVENOUS | Status: DC | PRN
  Administered 2022-09-17: 500 mL

## 2022-09-17 MED ORDER — EPINEPHRINE HCL 5 MG/250ML IV SOLN IN NS
0.0000 ug/min | INTRAVENOUS | Status: DC
  Filled 2022-09-17: qty 250

## 2022-09-17 MED ORDER — VASOPRESSIN 20 UNIT/ML IV SOLN
INTRAVENOUS | Status: AC
  Filled 2022-09-17: qty 1

## 2022-09-17 MED ORDER — FENTANYL CITRATE (PF) 250 MCG/5ML IJ SOLN
INTRAMUSCULAR | Status: DC | PRN
  Administered 2022-09-17 (×2): 100 ug via INTRAVENOUS
  Administered 2022-09-17: 150 ug via INTRAVENOUS
  Administered 2022-09-17: 100 ug via INTRAVENOUS
  Administered 2022-09-17 (×2): 150 ug via INTRAVENOUS
  Administered 2022-09-17 – 2022-09-18 (×2): 100 ug via INTRAVENOUS

## 2022-09-17 MED ORDER — MANNITOL 20 % IV SOLN
INTRAVENOUS | Status: DC
  Filled 2022-09-17: qty 13

## 2022-09-17 MED ORDER — CEFAZOLIN SODIUM-DEXTROSE 2-4 GM/100ML-% IV SOLN
2.0000 g | INTRAVENOUS | Status: DC
  Filled 2022-09-17: qty 100

## 2022-09-17 MED ORDER — LIDOCAINE 2% (20 MG/ML) 5 ML SYRINGE
INTRAMUSCULAR | Status: DC | PRN
  Administered 2022-09-17: 40 mg via INTRAVENOUS

## 2022-09-17 MED ORDER — PROPOFOL 10 MG/ML IV BOLUS
INTRAVENOUS | Status: DC | PRN
  Administered 2022-09-17: 50 mg via INTRAVENOUS
  Administered 2022-09-17: 100 mg via INTRAVENOUS
  Administered 2022-09-17 (×3): 50 mg via INTRAVENOUS

## 2022-09-17 MED ORDER — TRANEXAMIC ACID (OHS) BOLUS VIA INFUSION
15.0000 mg/kg | INTRAVENOUS | Status: AC
  Administered 2022-09-17: 1245 mg via INTRAVENOUS
  Filled 2022-09-17: qty 1245

## 2022-09-17 MED ORDER — IOHEXOL 350 MG/ML SOLN
75.0000 mL | Freq: Once | INTRAVENOUS | Status: AC | PRN
  Administered 2022-09-17: 75 mL via INTRAVENOUS

## 2022-09-17 MED ORDER — NOREPINEPHRINE 4 MG/250ML-% IV SOLN
INTRAVENOUS | Status: AC
  Filled 2022-09-17: qty 250

## 2022-09-17 MED ORDER — HEPARIN 30,000 UNITS/1000 ML (OHS) CELLSAVER SOLUTION
Status: DC
  Filled 2022-09-17: qty 1000

## 2022-09-17 MED ORDER — ONDANSETRON HCL 4 MG/2ML IJ SOLN
4.0000 mg | Freq: Once | INTRAMUSCULAR | Status: AC
  Administered 2022-09-17: 4 mg via INTRAVENOUS
  Filled 2022-09-17: qty 2

## 2022-09-17 MED ORDER — PHENYLEPHRINE HCL-NACL 20-0.9 MG/250ML-% IV SOLN
30.0000 ug/min | INTRAVENOUS | Status: AC
  Administered 2022-09-17: 60 ug/min via INTRAVENOUS
  Filled 2022-09-17: qty 250

## 2022-09-17 MED ORDER — METHYLPREDNISOLONE SODIUM SUCC 125 MG IJ SOLR
INTRAMUSCULAR | Status: DC | PRN
  Administered 2022-09-17: 125 mg via INTRAVENOUS

## 2022-09-17 MED ORDER — ROCURONIUM BROMIDE 10 MG/ML (PF) SYRINGE
PREFILLED_SYRINGE | INTRAVENOUS | Status: DC | PRN
  Administered 2022-09-17: 50 mg via INTRAVENOUS
  Administered 2022-09-17: 100 mg via INTRAVENOUS

## 2022-09-17 MED ORDER — FENTANYL CITRATE PF 50 MCG/ML IJ SOSY
50.0000 ug | PREFILLED_SYRINGE | Freq: Once | INTRAMUSCULAR | Status: AC
  Administered 2022-09-17: 50 ug via INTRAVENOUS
  Filled 2022-09-17: qty 1

## 2022-09-17 MED ORDER — SODIUM CHLORIDE 0.9 % IV BOLUS
1000.0000 mL | Freq: Once | INTRAVENOUS | Status: AC
  Administered 2022-09-17: 1000 mL via INTRAVENOUS

## 2022-09-17 MED ORDER — CEFAZOLIN SODIUM-DEXTROSE 2-4 GM/100ML-% IV SOLN
2.0000 g | INTRAVENOUS | Status: AC
  Administered 2022-09-17: 2 g via INTRAVENOUS
  Filled 2022-09-17: qty 100

## 2022-09-17 MED ORDER — MIDAZOLAM HCL (PF) 5 MG/ML IJ SOLN
INTRAMUSCULAR | Status: DC | PRN
  Administered 2022-09-17: 2 mg via INTRAVENOUS

## 2022-09-17 MED ORDER — TRANEXAMIC ACID 1000 MG/10ML IV SOLN
1.5000 mg/kg/h | INTRAVENOUS | Status: AC
  Administered 2022-09-17: 1.5 mg/kg/h via INTRAVENOUS
  Filled 2022-09-17: qty 25

## 2022-09-17 MED ORDER — PHENYLEPHRINE 80 MCG/ML (10ML) SYRINGE FOR IV PUSH (FOR BLOOD PRESSURE SUPPORT)
PREFILLED_SYRINGE | INTRAVENOUS | Status: DC | PRN
  Administered 2022-09-17: 160 ug via INTRAVENOUS
  Administered 2022-09-17: 80 ug via INTRAVENOUS
  Administered 2022-09-17: 160 ug via INTRAVENOUS

## 2022-09-17 MED ORDER — VANCOMYCIN HCL 1250 MG/250ML IV SOLN
1250.0000 mg | INTRAVENOUS | Status: AC
  Administered 2022-09-17: 1250 mg via INTRAVENOUS
  Filled 2022-09-17: qty 250

## 2022-09-17 MED ORDER — MAGNESIUM SULFATE 50 % IJ SOLN
40.0000 meq | INTRAMUSCULAR | Status: DC
  Filled 2022-09-17: qty 9.85

## 2022-09-17 MED ORDER — AMIODARONE HCL IN DEXTROSE 360-4.14 MG/200ML-% IV SOLN
60.0000 mg/h | INTRAVENOUS | Status: DC
  Administered 2022-09-17 – 2022-09-18 (×3): 60 mg/h via INTRAVENOUS
  Filled 2022-09-17 (×3): qty 200

## 2022-09-17 MED ORDER — HEPARIN SODIUM (PORCINE) 1000 UNIT/ML IJ SOLN
INTRAMUSCULAR | Status: DC | PRN
  Administered 2022-09-17: 24000 [IU] via INTRAVENOUS
  Administered 2022-09-17: 5000 [IU] via INTRAVENOUS

## 2022-09-17 MED ORDER — MILRINONE LACTATE IN DEXTROSE 20-5 MG/100ML-% IV SOLN
0.3000 ug/kg/min | INTRAVENOUS | Status: DC
  Filled 2022-09-17: qty 100

## 2022-09-17 MED ORDER — MIDAZOLAM HCL (PF) 10 MG/2ML IJ SOLN
INTRAMUSCULAR | Status: AC
  Filled 2022-09-17: qty 2

## 2022-09-17 MED ORDER — POTASSIUM CHLORIDE 2 MEQ/ML IV SOLN
80.0000 meq | INTRAVENOUS | Status: DC
  Filled 2022-09-17: qty 40

## 2022-09-17 MED ORDER — NOREPINEPHRINE 4 MG/250ML-% IV SOLN
0.0000 ug/min | INTRAVENOUS | Status: DC
  Filled 2022-09-17: qty 250

## 2022-09-17 MED ORDER — FENTANYL CITRATE PF 50 MCG/ML IJ SOSY: 25.0000 ug | PREFILLED_SYRINGE | Freq: Once | INTRAMUSCULAR | Status: AC

## 2022-09-17 MED ORDER — DEXMEDETOMIDINE HCL IN NACL 400 MCG/100ML IV SOLN
0.1000 ug/kg/h | INTRAVENOUS | Status: AC
  Administered 2022-09-18: .7 ug/kg/h via INTRAVENOUS
  Filled 2022-09-17: qty 100

## 2022-09-17 SURGICAL SUPPLY — 121 items
ADAPTER CARDIO PERF ANTE/RETRO (ADAPTER) ×1 IMPLANT
ADH SRG 12 PREFL SYR 3 SPRDR (MISCELLANEOUS) ×2
ADPR PRFSN 84XANTGRD RTRGD (ADAPTER) ×2
APL SKNCLS STERI-STRIP NONHPOA (GAUZE/BANDAGES/DRESSINGS)
BAG DECANTER FOR FLEXI CONT (MISCELLANEOUS) ×1 IMPLANT
BENZOIN TINCTURE PRP APPL 2/3 (GAUZE/BANDAGES/DRESSINGS) IMPLANT
BLADE CLIPPER SURG (BLADE) ×1 IMPLANT
BLADE STERNUM SYSTEM 6 (BLADE) ×1 IMPLANT
BLADE SURG 15 STRL LF DISP TIS (BLADE) ×1 IMPLANT
BLADE SURG 15 STRL SS (BLADE) ×1
CANISTER SUCT 3000ML PPV (MISCELLANEOUS) ×1 IMPLANT
CANN PRFSN .5XCNCT 15X34-48 (MISCELLANEOUS)
CANNULA EZ GLIDE AORTIC 21FR (CANNULA) IMPLANT
CANNULA GUNDRY RCSP 15FR (MISCELLANEOUS) IMPLANT
CANNULA OPTISITE PERFUSION 20F (CANNULA) IMPLANT
CANNULA PRFSN .5XCNCT 15X34-48 (MISCELLANEOUS) IMPLANT
CANNULA SUMP PERICARDIAL (CANNULA) IMPLANT
CANNULA VEN 2 STAGE (MISCELLANEOUS)
CATH HEART VENT LEFT (CATHETERS) IMPLANT
CATH ROBINSON RED A/P 18FR (CATHETERS) IMPLANT
CATH THORACIC 28FR (CATHETERS) IMPLANT
CATH THORACIC 36FR (CATHETERS) IMPLANT
CATH THORACIC 36FR RT ANG (CATHETERS) IMPLANT
CAUTERY SURG HI TEMP FINE TIP (MISCELLANEOUS) IMPLANT
CLIP VESOCCLUDE MED 6/CT (CLIP) ×1 IMPLANT
CLIP VESOCCLUDE SM WIDE 24/CT (CLIP) IMPLANT
CNTNR URN SCR LID CUP LEK RST (MISCELLANEOUS) IMPLANT
CONN 3/8X3/8 GISH STERILE (MISCELLANEOUS) IMPLANT
CONN Y 3/8X3/8X3/8  BEN (MISCELLANEOUS)
CONN Y 3/8X3/8X3/8 BEN (MISCELLANEOUS) IMPLANT
CONT SPEC 4OZ STRL OR WHT (MISCELLANEOUS) ×2
CONTAINER PROTECT SURGISLUSH (MISCELLANEOUS) IMPLANT
DRAPE CARDIOVASCULAR INCISE (DRAPES) ×1
DRAPE INCISE IOBAN 66X45 STRL (DRAPES) IMPLANT
DRAPE SRG 135X102X78XABS (DRAPES) ×1 IMPLANT
DRAPE WARM FLUID 44X44 (DRAPES) ×1 IMPLANT
DRSG AQUACEL AG ADV 3.5X14 (GAUZE/BANDAGES/DRESSINGS) IMPLANT
DRSG COVADERM 4X14 (GAUZE/BANDAGES/DRESSINGS) ×1 IMPLANT
ELECT REM PT RETURN 9FT ADLT (ELECTROSURGICAL) ×2
ELECTRODE REM PT RTRN 9FT ADLT (ELECTROSURGICAL) ×2 IMPLANT
FELT TEFLON 6X6 (MISCELLANEOUS) IMPLANT
GAUZE 4X4 16PLY ~~LOC~~+RFID DBL (SPONGE) ×1 IMPLANT
GAUZE SPONGE 4X4 12PLY STRL (GAUZE/BANDAGES/DRESSINGS) ×2 IMPLANT
GLOVE BIO SURGEON STRL SZ 6.5 (GLOVE) IMPLANT
GLOVE BIOGEL PI IND STRL 6.5 (GLOVE) IMPLANT
GLOVE BIOGEL PI IND STRL 7.5 (GLOVE) IMPLANT
GLOVE SS BIOGEL STRL SZ 7.5 (GLOVE) IMPLANT
GLOVE SURG SS PI 7.5 STRL IVOR (GLOVE) IMPLANT
GOWN STRL REUS W/ TWL LRG LVL3 (GOWN DISPOSABLE) ×4 IMPLANT
GOWN STRL REUS W/ TWL XL LVL3 (GOWN DISPOSABLE) IMPLANT
GOWN STRL REUS W/TWL LRG LVL3 (GOWN DISPOSABLE) ×3
GOWN STRL REUS W/TWL XL LVL3 (GOWN DISPOSABLE) ×4
GRAFT 4 BRANCH 28X50 (Prosthesis & Implant Heart) IMPLANT
HEMOSTAT POWDER SURGIFOAM 1G (HEMOSTASIS) IMPLANT
HEMOSTAT SURGICEL 2X14 (HEMOSTASIS) IMPLANT
INSERT FOGARTY SM (MISCELLANEOUS) IMPLANT
INSERT FOGARTY XLG (MISCELLANEOUS) IMPLANT
KIT BASIN OR (CUSTOM PROCEDURE TRAY) ×1 IMPLANT
KIT DILATOR VASC 18G NDL (KITS) IMPLANT
KIT DRAINAGE VACCUM ASSIST (KITS) IMPLANT
KIT SUCTION CATH 14FR (SUCTIONS) IMPLANT
KIT TURNOVER KIT B (KITS) ×1 IMPLANT
LINE VENT (MISCELLANEOUS) IMPLANT
LOOP VESSEL MAXI BLUE 1X16 (MISCELLANEOUS) IMPLANT
LOOP VESSEL SUPERMAXI WHITE (MISCELLANEOUS) IMPLANT
LOOP VESSEL SUPMAX 1.2X559 WHT (MISCELLANEOUS) ×1
NDL AORTIC AIR ASPIRATING (NEEDLE) IMPLANT
NEEDLE AORTIC AIR ASPIRATING (NEEDLE) IMPLANT
NS IRRIG 1000ML POUR BTL (IV SOLUTION) ×4 IMPLANT
NS IRRIG 500ML POUR BTL (IV SOLUTION) IMPLANT
PACK E OPEN HEART (SUTURE) ×1 IMPLANT
PACK OPEN HEART (CUSTOM PROCEDURE TRAY) ×1 IMPLANT
PAD ARMBOARD 7.5X6 YLW CONV (MISCELLANEOUS) ×2 IMPLANT
PENCIL BUTTON HOLSTER BLD 10FT (ELECTRODE) IMPLANT
POSITIONER HEAD DONUT 9IN (MISCELLANEOUS) IMPLANT
SEALANT PATCH FIBRIN 2X4IN (MISCELLANEOUS) IMPLANT
SEALANT SURG COSEAL 8ML (VASCULAR PRODUCTS) ×1 IMPLANT
SET MPS 3-ND DEL (MISCELLANEOUS) IMPLANT
SET VEIN GRAFT PERF (SET/KITS/TRAYS/PACK) IMPLANT
SPONGE T-LAP 18X18 ~~LOC~~+RFID (SPONGE) ×4 IMPLANT
SPONGE T-LAP 4X18 ~~LOC~~+RFID (SPONGE) ×1 IMPLANT
STAPLER VISISTAT 35W (STAPLE) ×1 IMPLANT
STOPCOCK 4 WAY LG BORE MALE ST (IV SETS) IMPLANT
SUT ETHIBOND 2 0 SH (SUTURE) ×2
SUT ETHIBOND 2 0 SH 36X2 (SUTURE) IMPLANT
SUT MNCRL AB 3-0 PS2 18 (SUTURE) IMPLANT
SUT PROLENE 3 0 RB 1 (SUTURE) ×1 IMPLANT
SUT PROLENE 3 0 SH DA (SUTURE) ×1 IMPLANT
SUT PROLENE 4 0 RB 1 (SUTURE) ×23
SUT PROLENE 4 0 SH DA (SUTURE) IMPLANT
SUT PROLENE 4-0 RB1 .5 CRCL 36 (SUTURE) IMPLANT
SUT PROLENE 4-0 RB1 18X2 ARM (SUTURE) IMPLANT
SUT PROLENE 5 0 C 1 36 (SUTURE) IMPLANT
SUT PROLENE 6 0 CC (SUTURE) IMPLANT
SUT PROLENE 7 0 BV 1 (SUTURE) IMPLANT
SUT PROLENE 7 0 BV1 MDA (SUTURE) IMPLANT
SUT SILK  1 MH (SUTURE) ×2
SUT SILK 1 MH (SUTURE) IMPLANT
SUT STEEL STERNAL CCS#1 18IN (SUTURE) IMPLANT
SUT STEEL SZ 6 DBL 3X14 BALL (SUTURE) IMPLANT
SUT VIC AB 1 CT1 18XCR BRD 8 (SUTURE) IMPLANT
SUT VIC AB 1 CT1 8-18 (SUTURE)
SUT VIC AB 1 CTX 27 (SUTURE) ×2 IMPLANT
SUT VIC AB 2-0 CT1 27 (SUTURE) ×1
SUT VIC AB 2-0 CT1 TAPERPNT 27 (SUTURE) IMPLANT
SUT VIC AB 3-0 SH 27 (SUTURE) ×1
SUT VIC AB 3-0 SH 27X BRD (SUTURE) IMPLANT
SUT VIC AB 3-0 X1 27 (SUTURE) ×2 IMPLANT
SUT VICRYL 4-0 PS2 18IN ABS (SUTURE) IMPLANT
SYR 10ML KIT SKIN ADHESIVE (MISCELLANEOUS) IMPLANT
SYSTEM SAHARA CHEST DRAIN ATS (WOUND CARE) ×1 IMPLANT
TAPE CLOTH SURG 4X10 WHT LF (GAUZE/BANDAGES/DRESSINGS) IMPLANT
TAPE PAPER 2X10 WHT MICROPORE (GAUZE/BANDAGES/DRESSINGS) IMPLANT
TOWEL GREEN STERILE (TOWEL DISPOSABLE) ×1 IMPLANT
TOWEL GREEN STERILE FF (TOWEL DISPOSABLE) ×1 IMPLANT
TRAY CATH LUMEN 1 20CM STRL (SET/KITS/TRAYS/PACK) IMPLANT
TRAY FOLEY SLVR 14FR TEMP STAT (SET/KITS/TRAYS/PACK) ×1 IMPLANT
TUBE CONNECTING 20X1/4 (TUBING) IMPLANT
VENT LEFT HEART 12002 (CATHETERS) ×1
WATER STERILE IRR 1000ML POUR (IV SOLUTION) ×2 IMPLANT
YANKAUER SUCT BULB TIP NO VENT (SUCTIONS) IMPLANT

## 2022-09-17 NOTE — Anesthesia Procedure Notes (Signed)
Central Venous Catheter Insertion Performed by: Effie Berkshire, MD, anesthesiologist Start/End11/02/2022 7:55 PM, 09/17/2022 7:58 PM Patient location: Pre-op. Preanesthetic checklist: patient identified, IV checked, site marked, risks and benefits discussed, surgical consent, monitors and equipment checked, pre-op evaluation, timeout performed and anesthesia consent Hand hygiene performed  and maximum sterile barriers used  PA cath was placed.Swan type:thermodilution Procedure performed without using ultrasound guided technique. Attempts: 1 Patient tolerated the procedure well with no immediate complications.

## 2022-09-17 NOTE — ED Provider Notes (Signed)
[facility] EMERGENCY DEPARTMENT Provider Note  CSN: [id number] Arrival date & time: 09/17/22 1658  Chief Complaint(s) Code Stroke  HPI James Estrada is a 81 y.o. male with history of AAA repair presenting to the emergency department with sudden onset of leg weakness.  He reports that he had associated chest pain and back pain.  He denies any numbness or tingling in the legs, arms.  This occurred suddenly, he had to crawl to the phone to call 911.  He reports the pain is significant.  He may have briefly lost consciousness when this occurred.  Denies any similar episodes in the past.  Denies headaches.  No speech difficulty, vision changes.  He reports his left leg is weaker than his right leg.   Past Medical History Past Medical History:  Diagnosis Date   AAA (abdominal aortic aneurysm) ([place])    last u/s done 07/18/17    Arthritis    Bradycardia    Dysrhythmia    frequent PAC, for 20 years   GERD (gastroesophageal reflux disease)    occ, OTC   Hemorrhoids    History of hiatal hernia    Hyperlipidemia    Hypertension    Seasonal allergies    Patient Active Problem List   Diagnosis Date Noted   Status post surgery 09/17/2022   Aneurysm of left internal iliac artery ([place]) 05/22/2022   Colon cancer screening 05/22/2022   Essential hypertension 05/22/2022   Hemorrhoids 05/22/2022   Inguinal hernia 05/22/2022   Irritable bowel syndrome 05/22/2022   Mixed hyperlipidemia 05/22/2022   Pure hypercholesterolemia 05/22/2022   Thrombocytopenia ([place]) 05/22/2022   Allergic rhinitis 05/22/2022   AAA (abdominal aortic aneurysm) without rupture ([place]) 05/04/2020   Trochanteric bursitis, right hip 08/20/2018   History of right hip replacement 08/20/2018   Status post total replacement of right hip 09/08/2017   Pain of right hip joint 08/07/2017   Unilateral primary osteoarthritis, right hip 08/07/2017   BPH (benign prostatic hypertrophy) with urinary obstruction  05/15/2013   Home Medication(s) Prior to Admission medications   Not on File                                                                                                                                    Past Surgical History Past Surgical History:  Procedure Laterality Date   ABDOMINAL AORTIC ENDOVASCULAR STENT GRAFT N/A 05/04/2020   Procedure: ABDOMINAL AORTIC ENDOVASCULAR STENT GRAFT;  Surgeon: [name], MD;  [facility];  Service: Vascular;  Laterality: N/A;   CATARACT EXTRACTION Bilateral 2009   DIAGNOSTIC LAPAROSCOPY     laparoscopic hernia repair   EYE SURGERY Bilateral    cataract removal   HERNIA REPAIR Bilateral 1999, 2006   JOINT REPLACEMENT Right ~2018   hip replacement   pheochromocytoma  1993   PROSTATECTOMY N/A 05/15/2013   Procedure: PROSTATECTOMY RETROPUBIC; SIMPLE OPEN PROSTATECTOMY;  Surgeon: [name],  MD;  Location: [facility];  Service: Urology;  Laterality: N/A;   TOTAL ELBOW REPLACEMENT Left    > 30 years ago   TOTAL HIP ARTHROPLASTY Right 09/08/2017   Procedure: RIGHT TOTAL HIP ARTHROPLASTY ANTERIOR APPROACH;  Surgeon: [name], MD;  Location: [facility];  Service: Orthopedics;  Laterality: Right;   ULTRASOUND GUIDANCE FOR VASCULAR ACCESS Bilateral 05/04/2020   Procedure: ULTRASOUND GUIDANCE FOR VASCULAR ACCESS;  Surgeon: [name], MD;  Location: [facility] OR;  Service: Vascular;  Laterality: Bilateral;   Family History Family History  Problem Relation Age of Onset   Heart disease Mother    Coronary artery disease Mother    Aneurysm Father    Diabetes Sister     Social History Social History   Tobacco Use   Smoking status: Never   Smokeless tobacco: Never  Vaping Use   Vaping Use: Never used  Substance Use Topics   Alcohol use: Yes    Comment: 2 or 3 drinks on weekends   Drug use: No   Allergies Patient has no known allergies.  Review of Systems Review of Systems  All other systems reviewed and are  negative.   Physical Exam Vital Signs  I have reviewed the triage vital signs BP (!) 153/73   Pulse (!) 53   Temp 98.6 F (37 C) (Oral)   Resp 20   Wt 83 kg   SpO2 96%   BMI 24.82 kg/m  Physical Exam Vitals and nursing note reviewed.  Constitutional:      General: He is not in acute distress.    Appearance: Normal appearance.  HENT:     Mouth/Throat:     Mouth: Mucous membranes are moist.  Eyes:     Conjunctiva/sclera: Conjunctivae normal.  Cardiovascular:     Rate and Rhythm: Normal rate and regular rhythm.  Pulmonary:     Effort: Pulmonary effort is normal. No respiratory distress.     Breath sounds: Normal breath sounds.  Abdominal:     General: Abdomen is flat.     Palpations: Abdomen is soft.     Tenderness: There is no abdominal tenderness.  Musculoskeletal:     Right lower leg: No edema.     Left lower leg: No edema.  Skin:    General: Skin is warm and dry.     Capillary Refill: Capillary refill takes less than 2 seconds.  Neurological:     Mental Status: He is alert and oriented to person, place, and time. Mental status is at baseline.     Comments: 3 out of 5 strength in the left lower extremity, 5 out of 5 strength in the right lower extremity.  No sensory deficit throughout to light touch.  5 out of 5 strength in the bilateral upper extremities.  Cranial nerves II through XII intact with no deficit.  No dysmetria to light touch.  Gait deferred.  Psychiatric:        Mood and Affect: Mood normal.        Behavior: Behavior normal.     ED Results and Treatments Labs (all labs ordered are listed, but only abnormal results are displayed) Labs Reviewed  PROTIME-INR - Abnormal; Notable for the following components:      Result Value   Prothrombin Time 15.5 (*)    All other components within normal limits  CBC - Abnormal; Notable for the following components:   WBC 13.3 (*)    Platelets 115 (*)    All other  components within normal limits  DIFFERENTIAL -  Abnormal; Notable for the following components:   Neutro Abs 9.5 (*)    Monocytes Absolute 1.1 (*)    Abs Immature Granulocytes 0.19 (*)    All other components within normal limits  COMPREHENSIVE METABOLIC PANEL - Abnormal; Notable for the following components:   Glucose, Bld 156 (*)    Creatinine, Ser 1.30 (*)    Calcium 8.7 (*)    Total Protein 5.6 (*)    Albumin 3.4 (*)    GFR, Estimated 56 (*)    All other components within normal limits  I-STAT CHEM 8, ED - Abnormal; Notable for the following components:   Glucose, Bld 161 (*)    All other components within normal limits  ETHANOL  APTT  RAPID URINE DRUG SCREEN, HOSP PERFORMED  URINALYSIS, ROUTINE W REFLEX MICROSCOPIC  TYPE AND SCREEN  PREPARE RBC (CROSSMATCH)  PREPARE FRESH FROZEN PLASMA  TROPONIN I (HIGH SENSITIVITY)  TROPONIN I (HIGH SENSITIVITY)                                                                                                                          Radiology CT Angio Chest/Abd/Pel for Dissection W and/or W/WO  Result Date: 09/17/2022 CLINICAL DATA:  Acute aortic syndrome (AAS) suspected EXAM: CT ANGIOGRAPHY CHEST, ABDOMEN AND PELVIS TECHNIQUE: Non-contrast CT of the chest was initially obtained. Multidetector CT imaging through the chest, abdomen and pelvis was performed using the standard protocol during bolus administration of intravenous contrast. Multiplanar reconstructed images and MIPs were obtained and reviewed to evaluate the vascular anatomy. RADIATION DOSE REDUCTION: This exam was performed according to the departmental dose-optimization program which includes automated exposure control, adjustment of the mA and/or kV according to patient size and/or use of iterative reconstruction technique. CONTRAST:  72mL OMNIPAQUE IOHEXOL 350 MG/ML SOLN COMPARISON:  CT a 07/21/2022 FINDINGS: CTA CHEST FINDINGS Cardiovascular: New from prior exam is an acute type A thoracic aortic dissection extending throughout the  course of the ascending aorta. There is small foci of extraluminal contrast adjacent to the ascending aorta suggestive of active hemorrhage. Small hemopericardium. The false lumen appears thrombosed. The dissection involves the origin of the brachiocephalic artery which is severely narrowed. Dissection involves the origin of the left common carotid artery. The left subclavian artery arises from the true lumen. The true lumen is greater than 50% narrowed involving the descending thoracic aorta. There is no central pulmonary embolus. Coronary artery calcifications are seen. Mediastinum/Nodes: Stranding in the anterior mediastinum related to aortic dissection. There is no obvious bulky adenopathy. The esophagus is decompressed. Lungs/Pleura: Hypoventilatory atelectasis dependently. No pleural fluid. Trachea and central airways are patent. Musculoskeletal: There are no acute or suspicious osseous abnormalities. Review of the MIP images confirms the above findings. CTA ABDOMEN AND PELVIS FINDINGS VASCULAR Aorta: Endovascular repair prior abdominal aortic aneurysm. The stent graft is patent. There is increased size of the excluded aneurysm sac currently 4.5 cm, previously 3.5 cm. There  are tiny foci of high density in the aneurysm sac series 7, image 204, that were not definitively seen on prior. The proximal aspect of the stent originates just below the renal arteries. The distal aspect of the stents in the common iliac arteries. There is moderate left retroperitoneal hemorrhage outside the aneurysm sac, but no definite extraluminal active bleeding. This retroperitoneal hemorrhage extends to the anterior aspect of the left psoas muscle as well as medial to the left kidney. Celiac: Arises from the true lumen, no stenosis. Dissection does not involve the celiac vessels. Similar ectasia of the celiac artery at 12 mm. SMA: Arises from the true lumen. No dissection involves the SMA. The distal branches are patent. Renals: The  right renal artery arises from the true lumen and is patent. There is narrowing at the origin of the left renal artery with diminished left renal perfusion. Stranding is seen adjacent to the proximal left renal artery which is irregular. IMA: Excluded by the graft, with reconstitution distally. Inflow: The right iliac limb terminates in the common iliac artery. The diameter of the distal common iliac artery beyond the graft measures 2.3 cm, previously 2.4 cm. 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 Distal arteries including into the right lower extremity are patent. There is no dissection involvement. The left iliac limb terminates in the common iliac artery. The diameter of the distal common iliac artery beyond the graft measures 2.5 cm, previously 2.6 cm. Aneurysm of the internal iliac artery measures 3.2 cm, previously 3.1 cm. The distal arteries are patent without dissection component. There is no evidence of active extravasation involving the iliac arteries as source of left retroperitoneal hemorrhage. Veins: Not well assessed on this arterial phase exam. Review of the MIP images confirms the above findings. NON-VASCULAR Hepatobiliary: Similar low-density liver lesions, incompletely characterized on this arterial phase exam but stable from prior. Unremarkable gallbladder. Pancreas: No ductal dilatation or inflammation. Spleen: Normal in size. Surgical clip is seen adjacent to the spleen. Adrenals/Urinary Tract: No adrenal nodules. Clips adjacent to the left adrenal gland. There is a small amount of hemorrhage medial to the left kidney. Decreased perfusion to the left kidney. No hydronephrosis of either kidney. Unremarkable urinary bladder. Stomach/Bowel: Small hiatal hernia. No bowel obstruction or inflammation. Mild sigmoid diverticulosis without diverticulitis. Lymphatic: No bulky adenopathy. Reproductive: Imminent prostate. Other: There is left retroperitoneal  hemorrhage note extends from the level of the left mid kidney to the iliac bifurcation. There is no active extravasation within the area of hemorrhage, although source may be the left iliac system. This hemorrhage abuts the anterior aspect of the left iliopsoas muscle. Probable fat containing bilateral inguinal hernias. Tiny fat containing umbilical hernia. Musculoskeletal: Degenerative disc disease in the lumbar spine. Right hip arthroplasty. No acute osseous findings. Review of the MIP images confirms the above findings. IMPRESSION: 1. Acute type A thoracic aortic dissection, thrombosis of the false lumen. There is active extravasation adjacent to the ascending aorta as well as small volume hemopericardium. Origin cardiothoracic surgery consultation is needed. 2. Dissection extends into the brachiocephalic artery causing severe narrowing at the origin. Dissection extends into the left common carotid artery origin. 3. Prior stent graft repair of abdominal aortic aneurysm. The residual aneurysm sac has increased in size, faint internal hyperdensities may represent endoleak. 4. Moderate amount of left retroperitoneal hemorrhage extending from the iliac vasculature to the level of the left kidney. There is no active extravasation within this retroperitoneal hemorrhage. Source is  uncertain, but may represent the left iliac landing zone. 5. Bilateral internal artery aneurysms, essentially stable. 6. Decreased perfusion of the left kidney, narrowing at the origin of the left renal artery. Critical Value/emergent preliminary results were discussed by telephone at the time of the exam on 09/17/2022 at 6:09 pm to provider Garnette Gunner , who verbally acknowledged these results. Electronically Signed   By: Keith Rake M.D.   On: 09/17/2022 18:42   CT HEAD CODE STROKE WO CONTRAST  Result Date: 09/17/2022 CLINICAL DATA:  Code stroke. Neuro deficit, acute, stroke suspected. EXAM: CT HEAD WITHOUT CONTRAST TECHNIQUE:  Contiguous axial images were obtained from the base of the skull through the vertex without intravenous contrast. RADIATION DOSE REDUCTION: This exam was performed according to the departmental dose-optimization program which includes automated exposure control, adjustment of the mA and/or kV according to patient size and/or use of iterative reconstruction technique. COMPARISON:  No pertinent prior exams available for comparison. FINDINGS: Brain: Mild generalized cerebral atrophy. Mild patchy and ill-defined hypoattenuation within the cerebral white matter, nonspecific but compatible with chronic small vessel ischemic disease. There is no acute intracranial hemorrhage. No demarcated cortical infarct. No extra-axial fluid collection. No evidence of an intracranial mass. No midline shift. Vascular: No hyperdense vessel. Atherosclerotic calcifications. Skull: No fracture or aggressive osseous lesion. Sinuses/Orbits: No mass or acute finding within the imaged orbits. No significant paranasal sinus disease at the imaged levels. ASPECTS [facility] Stroke Program Early CT Score) - Ganglionic level infarction (caudate, lentiform nuclei, internal capsule, insula, M1-M3 cortex): 7 - Supraganglionic infarction (M4-M6 cortex): 3 Total score (0-10 with 10 being normal): 10 No evidence of acute intracranial abnormality. These results were communicated to Dr. Cheral Marker at 5:54 pmon 11/4/2023by text page via the [facility] messaging system. IMPRESSION: No evidence of acute intracranial abnormality. Mild chronic small vessel image changes within the cerebral white matter. Mild generalized cerebral atrophy. Electronically Signed   By: Kellie Simmering D.O.   On: 09/17/2022 17:54    Pertinent labs & imaging results that were available during my care of the patient were reviewed by me and considered in my medical decision making (see MDM for details).  Medications Ordered in ED Medications  0.9 %  sodium chloride infusion (Manually program  via Guardrails IV Fluids) (has no administration in time range)  dexmedetomidine (PRECEDEX) 400 MCG/100ML (4 mcg/mL) infusion (has no administration in time range)  insulin regular, human (MYXREDLIN) 100 units/ 100 mL infusion (has no administration in time range)  EPINEPHrine (ADRENALIN) 5 mg in NS 250 mL (0.02 mg/mL) premix infusion (has no administration in time range)  milrinone (PRIMACOR) 20 MG/100 ML (0.2 mg/mL) infusion (has no administration in time range)  norepinephrine (LEVOPHED) 4mg  in 275mL (0.016 mg/mL) premix infusion (has no administration in time range)  phenylephrine (NEO-SYNEPHRINE) 20mg /NS 251mL premix infusion (has no administration in time range)  magnesium sulfate (IV Push/IM) injection 40 mEq (has no administration in time range)  potassium chloride injection 80 mEq (has no administration in time range)  heparin 30,000 units/NS 1000 mL solution for CELLSAVER (has no administration in time range)  heparin sodium (porcine) 2,500 Units, papaverine 30 mg in electrolyte-A (PLASMALYTE-A PH 7.4) 500 mL irrigation (has no administration in time range)  tranexamic acid (CYKLOKAPRON) pump prime solution 166 mg (has no administration in time range)  tranexamic acid (CYKLOKAPRON) bolus via infusion - over 30 minutes 1,245 mg (has no administration in time range)  tranexamic acid (CYKLOKAPRON) 2,500 mg in sodium chloride 0.9 % 250 mL (  10 mg/mL) infusion (has no administration in time range)  vancomycin (VANCOREADY) IVPB 1250 mg/250 mL (has no administration in time range)  ceFAZolin (ANCEF) IVPB 2g/100 mL premix (has no administration in time range)  ceFAZolin (ANCEF) IVPB 2g/100 mL premix (has no administration in time range)  [facility] Blood Cardioplegia vial (lidocaine/magnesium/mannitol 0.26g-4g-6.4g) (has no administration in time range)  0.9 %  sodium chloride infusion (Manually program via Guardrails IV Fluids) (has no administration in time range)  iohexol (OMNIPAQUE) 350 MG/ML  injection 75 mL (75 mLs Intravenous Contrast Given 09/17/22 1813)  sodium chloride 0.9 % bolus 1,000 mL (1,000 mLs Intravenous New Bag/Given 09/17/22 1808)  ondansetron (ZOFRAN) injection 4 mg (4 mg Intravenous Given 09/17/22 1845)  fentaNYL (SUBLIMAZE) injection 25 mcg (25 mcg Intravenous Given 09/17/22 1836)  fentaNYL (SUBLIMAZE) injection 50 mcg (50 mcg Intravenous Given 09/17/22 1905)                                                                                                                                     Procedures Procedures  (including critical care time)  Medical Decision Making / ED Course   MDM:  81 year old male presenting to the emergency department with leg weakness.  Patient does have objective neurologic deficit on exam of left leg weakness.  He also has symptom of chest and back pain.  Differential includes acute stroke, dissection, aortic rupture, aortic aneurysm repair leak, spinal cord process such as cauda equina.  Given objective neurologic deficit with recent time of onset, code stroke ordered as well as CTA dissection study given broad differential.  Patient taken promptly to CT scanner, CT head negative, however CTA dissection study demonstrates type a aortic dissection.  Discussed this with radiology at the time of CT scan and paged cardiothoracic surgery immediately who promptly came and evaluated the patient with plan for operative repair.  Patient became somnolent with  time in the emergency department while OR being prepared, was still protecting airway, transferred to the operating room.  CT scan demonstrates that dissection involves the brachiocephalic artery as well as the left carotid artery, suspect increasing somnolence could be related to hypoperfusion of brain.  Family aware of guarded prognosis given extensive dissection.  CT scan did demonstrate retroperitoneal bleeding as well, patient verbally consented for blood, given 1 unit given low blood  pressures initially although it seems that this was due to blood pressure cuff being on the right arm which was involved in the dissection.  Clinical Course as of 09/17/22 1954  Sat Sep 17, 2022  1819 Discussed CT scan with radiology which demonstrates dissection, Type A. Discussed with CT surgery Dr. Roxan Hockey who will come and evaluate the patient right away. Gave 2 units of blood as patient does have retroperitoneal bleeding and hypotensive. Discussed findings with  patient and family who are aware of serious nature of diagnosis and need for emergent surgery  [WS]  1832 Dr.  Hendrickson at bedside.  [WS]    Clinical Course User Index [WS] Cristie Hem, MD     Additional history obtained: -Additional history obtained from family and ems -External records from outside source obtained and reviewed including: Chart review including previous notes, labs, imaging, consultation notes including vascular note 07/26/22   Lab Tests: -I ordered, reviewed, and interpreted labs.   The pertinent results include:   Labs Reviewed  PROTIME-INR - Abnormal; Notable for the following components:      Result Value   Prothrombin Time 15.5 (*)    All other components within normal limits  CBC - Abnormal; Notable for the following components:   WBC 13.3 (*)    Platelets 115 (*)    All other components within normal limits  DIFFERENTIAL - Abnormal; Notable for the following components:   Neutro Abs 9.5 (*)    Monocytes Absolute 1.1 (*)    Abs Immature Granulocytes 0.19 (*)    All other components within normal limits  COMPREHENSIVE METABOLIC PANEL - Abnormal; Notable for the following components:   Glucose, Bld 156 (*)    Creatinine, Ser 1.30 (*)    Calcium 8.7 (*)    Total Protein 5.6 (*)    Albumin 3.4 (*)    GFR, Estimated 56 (*)    All other components within normal limits  I-STAT CHEM 8, ED - Abnormal; Notable for the following components:   Glucose, Bld 161 (*)    All other  components within normal limits  ETHANOL  APTT  RAPID URINE DRUG SCREEN, HOSP PERFORMED  URINALYSIS, ROUTINE W REFLEX MICROSCOPIC  TYPE AND SCREEN  PREPARE RBC (CROSSMATCH)  PREPARE FRESH FROZEN PLASMA  TROPONIN I (HIGH SENSITIVITY)  TROPONIN I (HIGH SENSITIVITY)    Notable for leukocytosis, AKI  EKG   EKG Interpretation  Date/Time:  Saturday September 17 2022 17:21:30 EDT Ventricular Rate:  82 PR Interval:  161 QRS Duration: 99 QT Interval:  421 QTC Calculation: 492 R Axis:   -5 Text Interpretation: Sinus rhythm Supraventricular bigeminy Anterior infarct, old Confirmed by Garnette Gunner [phone]) on 09/17/2022 7:38:29 PM         Imaging Studies ordered: I ordered imaging studies including CT head, CT dissection protocol On my interpretation imaging demonstrates extensive type a aortic dissection I independently visualized and interpreted imaging. I agree with the radiologist interpretation Discussed the results with radiology  Medicines ordered and prescription drug management: Meds ordered this encounter  Medications   DISCONTD: norepinephrine (LEVOPHED) 4-5 MG/250ML-% infusion SOLN    Hurth, Kimberly P: cabinet override   iohexol (OMNIPAQUE) 350 MG/ML injection 75 mL   0.9 %  sodium chloride infusion (Manually program via Guardrails IV Fluids)   fentaNYL (SUBLIMAZE) 50 MCG/ML injection    Duard Larsen, Paden E: cabinet override   sodium chloride 0.9 % bolus 1,000 mL   ondansetron (ZOFRAN) injection 4 mg   fentaNYL (SUBLIMAZE) injection 25 mcg   fentaNYL (SUBLIMAZE) injection 50 mcg   dexmedetomidine (PRECEDEX) 400 MCG/100ML (4 mcg/mL) infusion   insulin regular, human (MYXREDLIN) 100 units/ 100 mL infusion    Order Specific Question:   EndoTool low target:    Answer:   110    Order Specific Question:   EndoTool high target:    Answer:   140    Order Specific Question:   Mode of Therapy    Answer:   Hyperglycemia    Order Specific Question:   Start Method     Answer:   EndoTool  to calculate   EPINEPHrine (ADRENALIN) 5 mg in NS 250 mL (0.02 mg/mL) premix infusion   milrinone (PRIMACOR) 20 MG/100 ML (0.2 mg/mL) infusion   norepinephrine (LEVOPHED) 4mg  in 251mL (0.016 mg/mL) premix infusion   phenylephrine (NEO-SYNEPHRINE) 20mg /NS 240mL premix infusion   magnesium sulfate (IV Push/IM) injection 40 mEq   potassium chloride injection 80 mEq   heparin 30,000 units/NS 1000 mL solution for CELLSAVER   heparin sodium (porcine) 2,500 Units, papaverine 30 mg in electrolyte-A (PLASMALYTE-A PH 7.4) 500 mL irrigation   tranexamic acid (CYKLOKAPRON) pump prime solution 166 mg   tranexamic acid (CYKLOKAPRON) bolus via infusion - over 30 minutes 1,245 mg   tranexamic acid (CYKLOKAPRON) 2,500 mg in sodium chloride 0.9 % 250 mL (10 mg/mL) infusion   vancomycin (VANCOREADY) IVPB 1250 mg/250 mL    Order Specific Question:   Indication:    Answer:   Surgical Prophylaxis   ceFAZolin (ANCEF) IVPB 2g/100 mL premix    Order Specific Question:   Antibiotic Indication:    Answer:   Surgical Prophylaxis   ceFAZolin (ANCEF) IVPB 2g/100 mL premix    Order Specific Question:   Antibiotic Indication:    Answer:   Surgical Prophylaxis   [facility] Blood Cardioplegia vial (lidocaine/magnesium/mannitol 0.26g-4g-6.4g)   0.9 %  sodium chloride infusion (Manually program via Guardrails IV Fluids)    -I have reviewed the patients home medicines and have made adjustments as needed   Consultations Obtained: I requested consultation with the cardiothoracic surgeon,  and discussed lab and imaging findings as well as pertinent plan - they recommend: Operative repair of type a dissection   Cardiac Monitoring: The patient was maintained on a cardiac monitor.  I personally viewed and interpreted the cardiac monitored which showed an underlying rhythm of: Sinus arrhythmia  Reevaluation: After the interventions noted above, I reevaluated the patient and found that they have  worsened  Co morbidities that complicate the patient evaluation  Past Medical History:  Diagnosis Date   AAA (abdominal aortic aneurysm) ([place])    last u/s done 07/18/17    Arthritis    Bradycardia    Dysrhythmia    frequent PAC, for 20 years   GERD (gastroesophageal reflux disease)    occ, OTC   Hemorrhoids    History of hiatal hernia    Hyperlipidemia    Hypertension    Seasonal allergies       Dispostion: Disposition decision including need for hospitalization was considered, and patient admitted to the hospital.    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  Despite best efforts to proofread,  errors can occur which can change the documentation meaning.    Cristie Hem, MD 09/17/22 Karl Bales

## 2022-09-17 NOTE — Anesthesia Procedure Notes (Signed)
Arterial Line Insertion Start/End11/02/2022 7:35 PM, 09/17/2022 7:40 PM Performed by: Josephine Igo, CRNA, CRNA  Patient location: OR. Preanesthetic checklist: patient identified, monitors and equipment checked and pre-op evaluation Emergency situation Lidocaine 1% used for infiltration Right, radial was placed Catheter size: 20 G Hand hygiene performed , maximum sterile barriers used  and Seldinger technique used Allen's test indicative of satisfactory collateral circulation Attempts: 1 Procedure performed without using ultrasound guided technique. Following insertion, dressing applied and Biopatch. Post procedure assessment: normal and unchanged  Patient tolerated the procedure well with no immediate complications.

## 2022-09-17 NOTE — Consult Note (Addendum)
NEURO HOSPITALIST CONSULT NOTE   Requestig physician: Dr. Truett Mainland  Reason for Consult: Acute onset of severe BLE weakness  History obtained from:  Patient, Family and Chart      HPI:                                                                                                                                          JAKE IRIGOYEN is a 81 y.o. male with a past medical history AAA s/p EVAR with a known internal iliac artery aneurysm, arthritis, bradycardia, dysrhythmia, GERD, hemorrhoids, HLD, and HTN presenting from home via EMS for acute onset of severe BLE weakness. He was ambulating and had sudden onset of the BLE weakness resulting in a fall. Triage RN exam did an NIHSS and only deficits were leg weakness, left worse than right. Around 1550 he was outside talking to neighbors when he felt acutely weak in his BLE and lightheaded, in conjunction with acute onset of mild left neck pain and some left flank pain. He fell due to his legs collapsing under him and had to drag himself on the ground to move when he tried to get to the phone. He did make it up the steps and was able to get to the phone in his bedroom to call EMS. He was brought in to the ED where a Code Stroke was called. Since his arrival to the ED and while in CT his left flank pain has steadily worsened and his blood pressures have steadily dropped.   Of note, he has been under tremendous stress regarding family and drove 6 hours yesterday and 6 hours the day before from [place]. His wife also states that he has decreased appetite in relation to this but very healthy and active at baseline. He is not currently on any medications.   He does feel some tingling in his RLE, but no sensory loss to either lower extremity or his upper extremities. He denies any other neurological symptoms including no headache, vision loss, confusion, speech changes, facial droop, trouble swallowing, upper extremity weakness, saddle  anesthesia, groin pain or incontinence.    Past Medical History:  Diagnosis Date   AAA (abdominal aortic aneurysm) ([place])    last u/s done 07/18/17    Arthritis    Bradycardia    Dysrhythmia    frequent PAC, for 20 years   GERD (gastroesophageal reflux disease)    occ, OTC   Hemorrhoids    History of hiatal hernia    Hyperlipidemia    Hypertension    Seasonal allergies     Past Surgical History:  Procedure Laterality Date   ABDOMINAL AORTIC ENDOVASCULAR STENT GRAFT N/A 05/04/2020   Procedure: ABDOMINAL AORTIC ENDOVASCULAR STENT GRAFT;  Surgeon: [name], MD;  Location: [facility]  OR;  Service: Vascular;  Laterality: N/A;   CATARACT EXTRACTION Bilateral 2009   DIAGNOSTIC LAPAROSCOPY     laparoscopic hernia repair   EYE SURGERY Bilateral    cataract removal   HERNIA REPAIR Bilateral 1999, 2006   JOINT REPLACEMENT Right ~2018   hip replacement   pheochromocytoma  1993   PROSTATECTOMY N/A 05/15/2013   Procedure: PROSTATECTOMY RETROPUBIC; SIMPLE OPEN PROSTATECTOMY;  Surgeon: [name], MD;  Location: [facility] ORS;  Service: Urology;  Laterality: N/A;   TOTAL ELBOW REPLACEMENT Left    > 30 years ago   TOTAL HIP ARTHROPLASTY Right 09/08/2017   Procedure: RIGHT TOTAL HIP ARTHROPLASTY ANTERIOR APPROACH;  Surgeon: [name], MD;  Location: [facility] ORS;  Service: Orthopedics;  Laterality: Right;   ULTRASOUND GUIDANCE FOR VASCULAR ACCESS Bilateral 05/04/2020   Procedure: ULTRASOUND GUIDANCE FOR VASCULAR ACCESS;  Surgeon: [name], MD;  [facility] OR;  Service: Vascular;  Laterality: Bilateral;    Family History  Problem Relation Age of Onset   Heart disease Mother    Coronary artery disease Mother    Aneurysm Father    Diabetes Sister               Social History:  reports that he has never smoked. He has never used smokeless tobacco. He reports current alcohol use. He reports that he does not use drugs.  No Known Allergies  MEDICATIONS:                                                                                                                      Prior to Admission: No medications   ROS:                                                                                                                                       As per HPI.    Blood pressure 91/65, resp. rate 16, SpO2 96 %.   General Examination:  Physical Exam  HEENT-  [place]/AT    Lungs- Respirations unlabored Extremities- No edema. Good pulses palpated in the popliteal fossae. Normal left carotid pulse by palpation, somewhat diminished right carotid pulse by palpation.   Neurological Examination Mental Status: Awake and alert. Oriented x 5. Speech fluent with intact comprehension and naming. No dysarthria.  Cranial Nerves: II: Temporal visual fields intact with no extinction to DSS. PERRL.    III,IV, VI: No ptosis. EOMI. No nystagmus.  V: Temp sensation equal bilaterally VII: Smile symmetric VIII: Hearing intact to conversation IX,X: No hoarseness or hypophonia XI: Symmetric XII: Midline tongue extension Motor: BUE 5/5 proximally and distally with no asymmetry RLE 4/5 hip flexion, knee extension, knee flexion, ADF/APF LLE 2/5 hip flexion and knee flexion, 0/5 hip adduction and abduction, 3/5 knee extension, 2/5 ADF/APF. At rest, while lying supine, LLE is externally rotated. LLE also with decreased tone.  No pronator drift Sensory: Temp and light touch intact to BUE proximally and distally Temp and light touch intact to BLE proximally and distally. Intact scratch sensation to bilateral soles of feet.  Deep Tendon Reflexes: 1+ bilateral brachioradialis and biceps. 2+ bilateral patellae, 1+ bilateral achilles. Right toe downgoing, left toe upgoing.  Cerebellar: No ataxia with FNF bilaterally Gait: Unable to assess   Lab Results: Basic Metabolic Panel: No results for input(s): "NA",  "K", "CL", "CO2", "GLUCOSE", "BUN", "CREATININE", "CALCIUM", "MG", "PHOS" in the last 168 hours.  CBC: No results for input(s): "WBC", "NEUTROABS", "HGB", "HCT", "MCV", "PLT" in the last 168 hours.  Cardiac Enzymes: No results for input(s): "CKTOTAL", "CKMB", "CKMBINDEX", "TROPONINI" in the last 168 hours.  Lipid Panel: No results for input(s): "CHOL", "TRIG", "HDL", "CHOLHDL", "VLDL", "LDLCALC" in the last 168 hours.  Imaging: No results found.   Assessment: 81 year old male with a history of AAA s/p repair, presenting with acute onset of severe left worse than RLE weakness in conjunction with left flank/back pain resulting in collapse at home.  - Exam reveals severe LLE weakness and moderate RLE weakness without sensory loss to lower extremities or groin region. BUE with normal strength. No bulbar findings.  - STAT CT head with no acute abnormality.  - CT dissection protocol reveals acute aortic dissection with retroperitoneal blood adjacent to the left psoas muscle.  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 Although bilateral ACA infarctions were initially on the DDx, imaging findings of aortic dissection with retroperitoneal blood as well as flank pain on the same side as the weakest leg make this component of the DDx significantly less likely. Attempting an angiogram to assess cerebral circulation given rapidly dropping BP in the ED would delay emergent management of his dissection with low likelihood of yielding diagnostically useful information.  - Cannot treat possible spinal cord infarction with TNK due to aortic dissection.    Recommendations: - Emergent aortic dissection repair with an effort to spare the arterial supply of the spinal cord is emergently necessary.  - Discussed with Dr. Truett Mainland - After he is stabilized, MRI of the  thoracic and lumbar spine to evaluate the thoracic spinal cord, conus medullaris and lumbosacral plexii should be obtained if not contraindicated from a surgical standpoint  55 minutes spent in the emergent neurological evaluation and management of this critically ill patient.  Electronically signed: Dr. Kerney Elbe 09/17/2022, 5:42 PM

## 2022-09-17 NOTE — ED Notes (Signed)
Pt to OR with this RN at this time.

## 2022-09-17 NOTE — ED Triage Notes (Signed)
Pt BIB [facility] from home, patient was ambulating and had sudden onset of bilateral leg weakness resulting in a collapse and left upper back pain. Pt does not take any medicine and is very active. Hx of hiatal hernia and abd and arterial aneurysm. VSS, BP 110/78

## 2022-09-17 NOTE — Anesthesia Preprocedure Evaluation (Addendum)
Anesthesia Evaluation  Patient identified by MRN, date of birth, ID band Patient confused  General Assessment Comment:Pt unable to participate in interview and nonverbal upon arrival to OR  Reviewed: Allergy & Precautions, Unable to perform ROS - Chart review onlyPreop documentation limited or incomplete due to emergent nature of procedure.  Airway Mallampati: III  TM Distance: >3 FB     Dental  (+) Teeth Intact   Pulmonary    breath sounds clear to auscultation       Cardiovascular hypertension, + Peripheral Vascular Disease   Rhythm:Regular Rate:Normal     Neuro/Psych    GI/Hepatic hiatal hernia,GERD  ,,  Endo/Other    Renal/GU      Musculoskeletal  (+) Arthritis ,    Abdominal   Peds  Hematology   Anesthesia Other Findings   Reproductive/Obstetrics                             Anesthesia Physical Anesthesia Plan  ASA: 4 and emergent  Anesthesia Plan: General   Post-op Pain Management:    Induction: Intravenous  PONV Risk Score and Plan: 2 and Ondansetron and Treatment may vary due to age or medical condition  Airway Management Planned: Oral ETT  Additional Equipment: Arterial line, CVP, PA Cath, TEE and Ultrasound Guidance Line Placement  Intra-op Plan:   Post-operative Plan: Post-operative intubation/ventilation  Informed Consent: I have reviewed the patients History and Physical, chart, labs and discussed the procedure including the risks, benefits and alternatives for the proposed anesthesia with the patient or authorized representative who has indicated his/her understanding and acceptance.     Only emergency history available and History available from chart only  Plan Discussed with: CRNA  Anesthesia Plan Comments:        Anesthesia Quick Evaluation

## 2022-09-17 NOTE — ED Notes (Signed)
Pt's alertness had a sudden decrease. Pt's eyes open to voice, but pt does not respond verbally. Pt with purposeful movements. EDP at bedside.

## 2022-09-17 NOTE — Progress Notes (Signed)
  Echocardiogram Echocardiogram Transesophageal has been performed.  James Estrada 09/17/2022, 8:18 PM

## 2022-09-17 NOTE — Anesthesia Procedure Notes (Signed)
Procedure Name: Intubation Date/Time: 09/17/2022 7:45 PM  Performed by: Reece Agar, CRNAPre-anesthesia Checklist: Patient identified, Emergency Drugs available, Suction available and Patient being monitored Patient Re-evaluated:Patient Re-evaluated prior to induction Oxygen Delivery Method: Circle System Utilized Preoxygenation: Pre-oxygenation with 100% oxygen Induction Type: IV induction, Rapid sequence and Cricoid Pressure applied Ventilation: Unable to mask ventilate Laryngoscope Size: Mac and 4 Grade View: Grade I Tube type: Oral Tube size: 8.0 mm Number of attempts: 1 Airway Equipment and Method: Stylet Placement Confirmation: ETT inserted through vocal cords under direct vision, positive ETCO2 and breath sounds checked- equal and bilateral Secured at: 22 cm Tube secured with: Tape Dental Injury: Teeth and Oropharynx as per pre-operative assessment

## 2022-09-17 NOTE — Anesthesia Procedure Notes (Signed)
Central Venous Catheter Insertion Performed by: Effie Berkshire, MD, anesthesiologist Start/End11/02/2022 7:50 PM, 09/17/2022 7:55 PM Patient location: Pre-op. Preanesthetic checklist: patient identified, IV checked, site marked, risks and benefits discussed, surgical consent, monitors and equipment checked, pre-op evaluation, timeout performed and anesthesia consent Position: Trendelenburg Lidocaine 1% used for infiltration and patient sedated Hand hygiene performed , maximum sterile barriers used  and Seldinger technique used Catheter size: 9 Fr Total catheter length 10. Central line was placed.Sheath introducer Swan type:thermodilution PA Cath depth:50 Procedure performed using ultrasound guided technique. Ultrasound Notes:anatomy identified, needle tip was noted to be adjacent to the nerve/plexus identified, no ultrasound evidence of intravascular and/or intraneural injection and image(s) printed for medical record Attempts: 1 Following insertion, line sutured, dressing applied and Biopatch. Post procedure assessment: blood return through all ports, free fluid flow and no air  Patient tolerated the procedure well with no immediate complications.

## 2022-09-17 NOTE — H&P (Signed)
James Estrada is an 81 y.o. male.   Chief Complaint: CP, near syncope HPI: 81 year old man with past history of hypertension, hyperlipidemia, bradycardia, frequent PACs, and AAA s/p stent graft.  Around 4 PM today had sudden onset of bilateral leg weakness and collapsed.  Also chest/ left upper back pain.  Came to ED.  Head CT and CT angio done.  CT angio shows type dissection with thrombosed false lumen.  Currently awake and alert, still c/o CP.    Past Medical History:  Diagnosis Date   AAA (abdominal aortic aneurysm) (HCC)    last u/s done 07/18/17    Arthritis    Bradycardia    Dysrhythmia    frequent PAC, for 20 years   GERD (gastroesophageal reflux disease)    occ, OTC   Hemorrhoids    History of hiatal hernia    Hyperlipidemia    Hypertension    Seasonal allergies     Past Surgical History:  Procedure Laterality Date   ABDOMINAL AORTIC ENDOVASCULAR STENT GRAFT N/A 05/04/2020   Procedure: ABDOMINAL AORTIC ENDOVASCULAR STENT GRAFT;  Surgeon: Larina Earthly, MD;  Location: [facility] OR;  Service: Vascular;  Laterality: N/A;   CATARACT EXTRACTION Bilateral 2009   DIAGNOSTIC LAPAROSCOPY     laparoscopic hernia repair   EYE SURGERY Bilateral    cataract removal   HERNIA REPAIR Bilateral 1999, 2006   JOINT REPLACEMENT Right ~2018   hip replacement   pheochromocytoma  1993   PROSTATECTOMY N/A 05/15/2013   Procedure: PROSTATECTOMY RETROPUBIC; SIMPLE OPEN PROSTATECTOMY;  Surgeon: Valetta Fuller, MD;  Location: [facility] ORS;  Service: Urology;  Laterality: N/A;   TOTAL ELBOW REPLACEMENT Left    > 30 years ago   TOTAL HIP ARTHROPLASTY Right 09/08/2017   Procedure: RIGHT TOTAL HIP ARTHROPLASTY ANTERIOR APPROACH;  Surgeon: Kathryne Hitch, MD;  Location: [facility] ORS;  Service: Orthopedics;  Laterality: Right;   ULTRASOUND GUIDANCE FOR VASCULAR ACCESS Bilateral 05/04/2020   Procedure: ULTRASOUND GUIDANCE FOR VASCULAR ACCESS;  Surgeon: Larina Earthly, MD;  Location: [facility] OR;  Service: Vascular;   Laterality: Bilateral;    Family History  Problem Relation Age of Onset   Heart disease Mother    Coronary artery disease Mother    Aneurysm Father    Diabetes Sister    Social History:  reports that he has never smoked. He has never used smokeless tobacco. He reports current alcohol use. He reports that he does not use drugs.  Allergies: No Known Allergies  (Not in a hospital admission)   Results for orders placed or performed during the hospital encounter of 09/17/22 (from the past 48 hour(s))  Protime-INR     Status: Abnormal   Collection Time: 09/17/22  5:22 PM  Result Value Ref Range   Prothrombin Time 15.5 (H) 11.4 - 15.2 seconds   INR 1.2 0.8 - 1.2    Comment: (NOTE) INR goal varies based on device and disease states. Performed at [facility], [address], [place], [place] [address]   APTT     Status: None   Collection Time: 09/17/22  5:22 PM  Result Value Ref Range   aPTT 30 24 - 36 seconds    Comment: Performed at [facility], [address] [place], [place] [address]  CBC     Status: Abnormal   Collection Time: 09/17/22  5:22 PM  Result Value Ref Range   WBC 13.3 (H) 4.0 - 10.5 K/uL  RBC 4.91 4.22 - 5.81 MIL/uL   Hemoglobin 13.8 13.0 - 17.0 g/dL   HCT 43.0 39.0 - 52.0 %   MCV 87.6 80.0 - 100.0 fL   MCH 28.1 26.0 - 34.0 pg   MCHC 32.1 30.0 - 36.0 g/dL   RDW 12.8 11.5 - 15.5 %   Platelets 115 (L) 150 - 400 K/uL    Comment: REPEATED TO VERIFY   nRBC 0.0 0.0 - 0.2 %    Comment: Performed at [facility], [place] [address] [place], [place] [address]  Differential     Status: Abnormal   Collection Time: 09/17/22  5:22 PM  Result Value Ref Range   Neutrophils Relative % 72 %   Neutro Abs 9.5 (H) 1.7 - 7.7 K/uL   Lymphocytes Relative 17 %   Lymphs Abs 2.3 0.7 - 4.0 K/uL   Monocytes Relative 8 %   Monocytes Absolute 1.1 (H) 0.1 - 1.0 K/uL   Eosinophils Relative 1 %   Eosinophils Absolute 0.2 0.0 - 0.5 K/uL   Basophils Relative 1 %    Basophils Absolute 0.1 0.0 - 0.1 K/uL   Immature Granulocytes 1 %   Abs Immature Granulocytes 0.19 (H) 0.00 - 0.07 K/uL    Comment: Performed at [place] [address], [place], [place] [address]  Comprehensive metabolic panel     Status: Abnormal   Collection Time: 09/17/22  5:22 PM  Result Value Ref Range   Sodium 139 135 - 145 mmol/L   Potassium 3.5 3.5 - 5.1 mmol/L   Chloride 105 98 - 111 mmol/L   CO2 23 22 - 32 mmol/L   Glucose, Bld 156 (H) 70 - 99 mg/dL    Comment: Glucose reference range applies only to samples taken after fasting for at least 8 hours.   BUN 21 8 - 23 mg/dL   Creatinine, Ser 1.30 (H) 0.61 - 1.24 mg/dL   Calcium 8.7 (L) 8.9 - 10.3 mg/dL   Total Protein 5.6 (L) 6.5 - 8.1 g/dL   Albumin 3.4 (L) 3.5 - 5.0 g/dL   AST 20 15 - 41 U/L   ALT 13 0 - 44 U/L   Alkaline Phosphatase 45 38 - 126 U/L   Total Bilirubin 0.8 0.3 - 1.2 mg/dL   GFR, Estimated 56 (L) >60 mL/min    Comment: (NOTE) Calculated using the CKD-EPI Creatinine Equation (2021)    Anion gap 11 5 - 15    Comment: Performed at [facility], [place] [address], [place], [place] [address]  Troponin I (High Sensitivity)     Status: None   Collection Time: 09/17/22  5:22 PM  Result Value Ref Range   Troponin I (High Sensitivity) 7 <18 ng/L    Comment: (NOTE) Elevated high sensitivity troponin I (hsTnI) values and significant  changes across serial measurements may suggest ACS but many other  chronic and acute conditions are known to elevate hsTnI results.  Refer to the "Links" section for chest pain algorithms and additional  guidance. Performed at [facility], [place] [address], [place], [place] [address]   Ethanol     Status: None   Collection Time: 09/17/22  5:33 PM  Result Value Ref Range   Alcohol, Ethyl (B) <10 <10 mg/dL    Comment: (NOTE) Lowest detectable limit for serum alcohol is 10 mg/dL.  For medical purposes only. Performed at [facility], [place] [address],  [place],  [address]  I-stat chem 8, ED     Status: Abnormal   Collection Time: 09/17/22  5:43 PM  Result Value Ref Range   Sodium 139 135 - 145 mmol/L   Potassium 3.6 3.5 - 5.1 mmol/L   Chloride 102 98 - 111 mmol/L   BUN 23 8 - 23 mg/dL   Creatinine, Ser 1.20 0.61 - 1.24 mg/dL   Glucose, Bld 161 (H) 70 - 99 mg/dL    Comment: Glucose reference range applies only to samples taken after fasting for at least 8 hours.   Calcium, Ion 1.15 1.15 - 1.40 mmol/L   TCO2 22 22 - 32 mmol/L   Hemoglobin 13.9 13.0 - 17.0 g/dL   HCT 41.0 39.0 - 52.0 %  Type and screen Ordered by PROVIDER DEFAULT     Status: None (Preliminary result)   Collection Time: 09/17/22  6:20 PM  Result Value Ref Range   ABO/RH(D) PENDING    Antibody Screen PENDING    Sample Expiration 09/20/2022,2359    Unit Number [id number]    Blood Component Type RED CELLS,LR    Unit division 00    Status of Unit ISSUED    Transfusion Status PENDING    Crossmatch Result PENDING    Unit Number [id number]    Blood Component Type RED CELLS,LR    Unit division 00    Status of Unit      ISSUED Performed at [facility], [place] [address] [place], [place] [address]    Transfusion Status PENDING    Crossmatch Result PENDING    CT HEAD CODE STROKE WO CONTRAST  Result Date: 09/17/2022 CLINICAL DATA:  Code stroke. Neuro deficit, acute, stroke suspected. EXAM: CT HEAD WITHOUT CONTRAST TECHNIQUE: Contiguous axial images were obtained from the base of the skull through the vertex without intravenous contrast. RADIATION DOSE REDUCTION: This exam was performed according to the departmental dose-optimization program which includes automated exposure control, adjustment of the mA and/or kV according to patient size and/or use of iterative reconstruction technique. COMPARISON:  No pertinent prior exams available for comparison. FINDINGS: Brain: Mild generalized cerebral atrophy. Mild patchy and ill-defined hypoattenuation within the  cerebral white matter, nonspecific but compatible with chronic small vessel ischemic disease. There is no acute intracranial hemorrhage. No demarcated cortical infarct. No extra-axial fluid collection. No evidence of an intracranial mass. No midline shift. Vascular: No hyperdense vessel. Atherosclerotic calcifications. Skull: No fracture or aggressive osseous lesion. Sinuses/Orbits: No mass or acute finding within the imaged orbits. No significant paranasal sinus disease at the imaged levels. ASPECTS [facility] Stroke Program Early CT Score) - Ganglionic level infarction (caudate, lentiform nuclei, internal capsule, insula, M1-M3 cortex): 7 - Supraganglionic infarction (M4-M6 cortex): 3 Total score (0-10 with 10 being normal): 10 No evidence of acute intracranial abnormality. These results were communicated to Dr. Cheral Marker at 5:54 pmon 11/4/2023by text page via the [facility] messaging system. IMPRESSION: No evidence of acute intracranial abnormality. Mild chronic small vessel image changes within the cerebral white matter. Mild generalized cerebral atrophy. Electronically Signed   By: Kellie Simmering D.O.   On: 09/17/2022 17:54    Review of Systems  Cardiovascular:  Positive for chest pain.  Neurological:  Positive for dizziness and weakness.    Blood pressure 135/80, pulse (!) 53, temperature 98.3 F (36.8 C), temperature source Oral, resp. rate 16, SpO2 96 %. Physical Exam Vitals reviewed.  Constitutional:      General: He is in acute distress.     Appearance: Normal appearance. He  is ill-appearing.  HENT:     Head: Normocephalic and atraumatic.  Eyes:     General: No scleral icterus.    Extraocular Movements: Extraocular movements intact.  Neck:     Vascular: No carotid bruit.  Cardiovascular:     Rate and Rhythm: Normal rate and regular rhythm.     Heart sounds: Murmur (faint systolic) heard.     Comments: Faint right carotid, absent right radial pulses, other pulses 2+ Pulmonary:     Effort:  Pulmonary effort is normal. No respiratory distress.     Breath sounds: Normal breath sounds. No wheezing or rales.  Abdominal:     General: There is no distension.     Palpations: Abdomen is soft.  Skin:    General: Skin is warm and dry.  Neurological:     General: No focal deficit present.     Mental Status: He is alert and oriented to person, place, and time.     Cranial Nerves: No cranial nerve deficit.     Motor: Weakness (moves all 4 ext, some weakness left leg) present.      Assessment/Plan 81 yo man with a history of hypertension, hyperlipidemia and AAA s/p stent graft.  Presented after collapse due to sudden bilateral weakness and chest and upper back pain.  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  Majority of the false lumen is thrombosed.  Also evidence of a retroperitoneal bleed.  Needs emergent repair of type 1 dissection.  I discussed the general nature of the procedure, including the need for general anesthesia, the use of cardiopulmonary bypass, the use of hypothermic circulatory arrest, the incisions to be used, and the use of drainage tubes postoperatively with Mr. Kelder and his family. I informed them of the indications, risks, benefits and alternatives.  They understand the risks include, but are not limited to death, stroke, MI, DVT/PE, bleeding, possible need for transfusion, infections, cardiac arrhythmias as well as other organ system dysfunction including respiratory, renal, or GI complications.   He accept the risks and agrees to proceed.    OR notified  BP was in [id number] systolic on right arm, when moved to left side was 130.  [name], MD 09/17/2022, 6:39 PM

## 2022-09-18 ENCOUNTER — Other Ambulatory Visit: Payer: Self-pay

## 2022-09-18 ENCOUNTER — Inpatient Hospital Stay (HOSPITAL_COMMUNITY): Payer: Medicare Other

## 2022-09-18 DIAGNOSIS — Z9889 Other specified postprocedural states: Secondary | ICD-10-CM | POA: Diagnosis not present

## 2022-09-18 DIAGNOSIS — G8220 Paraplegia, unspecified: Secondary | ICD-10-CM

## 2022-09-18 HISTORY — DX: Other specified postprocedural states: Z98.890

## 2022-09-18 LAB — POCT I-STAT 7, (LYTES, BLD GAS, ICA,H+H)
Acid-base deficit: 1 mmol/L (ref 0.0–2.0)
Acid-base deficit: 2 mmol/L (ref 0.0–2.0)
Acid-base deficit: 2 mmol/L (ref 0.0–2.0)
Acid-base deficit: 2 mmol/L (ref 0.0–2.0)
Acid-base deficit: 5 mmol/L — ABNORMAL HIGH (ref 0.0–2.0)
Acid-base deficit: 2 mmol/L (ref 0.0–2.0)
Acid-base deficit: 1 mmol/L (ref 0.0–2.0)
Bicarbonate: 21.2 mmol/L (ref 20.0–28.0)
Bicarbonate: 22.1 mmol/L (ref 20.0–28.0)
Bicarbonate: 23.2 mmol/L (ref 20.0–28.0)
Bicarbonate: 23.6 mmol/L (ref 20.0–28.0)
Bicarbonate: 25.9 mmol/L (ref 20.0–28.0)
Bicarbonate: 22.8 mmol/L (ref 20.0–28.0)
Bicarbonate: 24 mmol/L (ref 20.0–28.0)
Calcium, Ion: 0.91 mmol/L — ABNORMAL LOW (ref 1.15–1.40)
Calcium, Ion: 0.92 mmol/L — ABNORMAL LOW (ref 1.15–1.40)
Calcium, Ion: 0.94 mmol/L — ABNORMAL LOW (ref 1.15–1.40)
Calcium, Ion: 0.96 mmol/L — ABNORMAL LOW (ref 1.15–1.40)
Calcium, Ion: 1.46 mmol/L — ABNORMAL HIGH (ref 1.15–1.40)
Calcium, Ion: 1.1 mmol/L — ABNORMAL LOW (ref 1.15–1.40)
Calcium, Ion: 1.17 mmol/L (ref 1.15–1.40)
HCT: 20 % — ABNORMAL LOW (ref 39.0–52.0)
HCT: 21 % — ABNORMAL LOW (ref 39.0–52.0)
HCT: 26 % — ABNORMAL LOW (ref 39.0–52.0)
HCT: 27 % — ABNORMAL LOW (ref 39.0–52.0)
HCT: 28 % — ABNORMAL LOW (ref 39.0–52.0)
HCT: 27 % — ABNORMAL LOW (ref 39.0–52.0)
HCT: 22 % — ABNORMAL LOW (ref 39.0–52.0)
Hemoglobin: 6.8 g/dL — CL (ref 13.0–17.0)
Hemoglobin: 7.1 g/dL — ABNORMAL LOW (ref 13.0–17.0)
Hemoglobin: 8.8 g/dL — ABNORMAL LOW (ref 13.0–17.0)
Hemoglobin: 9.2 g/dL — ABNORMAL LOW (ref 13.0–17.0)
Hemoglobin: 9.5 g/dL — ABNORMAL LOW (ref 13.0–17.0)
Hemoglobin: 9.2 g/dL — ABNORMAL LOW (ref 13.0–17.0)
Hemoglobin: 7.5 g/dL — ABNORMAL LOW (ref 13.0–17.0)
O2 Saturation: 100 %
O2 Saturation: 100 %
O2 Saturation: 100 %
O2 Saturation: 100 %
O2 Saturation: 100 %
O2 Saturation: 97 %
O2 Saturation: 97 %
Patient temperature: 35.9
Patient temperature: 37.1
Potassium: 4.5 mmol/L (ref 3.5–5.1)
Potassium: 4.6 mmol/L (ref 3.5–5.1)
Potassium: 4.7 mmol/L (ref 3.5–5.1)
Potassium: 5 mmol/L (ref 3.5–5.1)
Potassium: 5.1 mmol/L (ref 3.5–5.1)
Potassium: 4.7 mmol/L (ref 3.5–5.1)
Potassium: 3.7 mmol/L (ref 3.5–5.1)
Sodium: 138 mmol/L (ref 135–145)
Sodium: 138 mmol/L (ref 135–145)
Sodium: 138 mmol/L (ref 135–145)
Sodium: 138 mmol/L (ref 135–145)
Sodium: 139 mmol/L (ref 135–145)
Sodium: 140 mmol/L (ref 135–145)
Sodium: 140 mmol/L (ref 135–145)
TCO2: 23 mmol/L (ref 22–32)
TCO2: 23 mmol/L (ref 22–32)
TCO2: 24 mmol/L (ref 22–32)
TCO2: 25 mmol/L (ref 22–32)
TCO2: 28 mmol/L (ref 22–32)
TCO2: 24 mmol/L (ref 22–32)
TCO2: 25 mmol/L (ref 22–32)
pCO2 arterial: 31.8 mmHg — ABNORMAL LOW (ref 32–48)
pCO2 arterial: 39.9 mmHg (ref 32–48)
pCO2 arterial: 42.3 mmHg (ref 32–48)
pCO2 arterial: 44.1 mmHg (ref 32–48)
pCO2 arterial: 54.3 mmHg — ABNORMAL HIGH (ref 32–48)
pCO2 arterial: 38.3 mmHg (ref 32–48)
pCO2 arterial: 39.5 mmHg (ref 32–48)
pH, Arterial: 7.287 — ABNORMAL LOW (ref 7.35–7.45)
pH, Arterial: 7.291 — ABNORMAL LOW (ref 7.35–7.45)
pH, Arterial: 7.354 (ref 7.35–7.45)
pH, Arterial: 7.372 (ref 7.35–7.45)
pH, Arterial: 7.451 — ABNORMAL HIGH (ref 7.35–7.45)
pH, Arterial: 7.377 (ref 7.35–7.45)
pH, Arterial: 7.393 (ref 7.35–7.45)
pO2, Arterial: 214 mmHg — ABNORMAL HIGH (ref 83–108)
pO2, Arterial: 259 mmHg — ABNORMAL HIGH (ref 83–108)
pO2, Arterial: 275 mmHg — ABNORMAL HIGH (ref 83–108)
pO2, Arterial: 333 mmHg — ABNORMAL HIGH (ref 83–108)
pO2, Arterial: 635 mmHg — ABNORMAL HIGH (ref 83–108)
pO2, Arterial: 87 mmHg (ref 83–108)
pO2, Arterial: 96 mmHg (ref 83–108)

## 2022-09-18 LAB — POCT I-STAT, CHEM 8
BUN: 26 mg/dL — ABNORMAL HIGH (ref 8–23)
BUN: 26 mg/dL — ABNORMAL HIGH (ref 8–23)
BUN: 26 mg/dL — ABNORMAL HIGH (ref 8–23)
BUN: 27 mg/dL — ABNORMAL HIGH (ref 8–23)
BUN: 21 mg/dL (ref 8–23)
Calcium, Ion: 0.84 mmol/L — CL (ref 1.15–1.40)
Calcium, Ion: 0.85 mmol/L — CL (ref 1.15–1.40)
Calcium, Ion: 0.99 mmol/L — ABNORMAL LOW (ref 1.15–1.40)
Calcium, Ion: 1.45 mmol/L — ABNORMAL HIGH (ref 1.15–1.40)
Calcium, Ion: 1.09 mmol/L — ABNORMAL LOW (ref 1.15–1.40)
Chloride: 101 mmol/L (ref 98–111)
Chloride: 103 mmol/L (ref 98–111)
Chloride: 105 mmol/L (ref 98–111)
Chloride: 105 mmol/L (ref 98–111)
Chloride: 105 mmol/L (ref 98–111)
Creatinine, Ser: 1.1 mg/dL (ref 0.61–1.24)
Creatinine, Ser: 1.1 mg/dL (ref 0.61–1.24)
Creatinine, Ser: 1.2 mg/dL (ref 0.61–1.24)
Creatinine, Ser: 1.2 mg/dL (ref 0.61–1.24)
Creatinine, Ser: 1.3 mg/dL — ABNORMAL HIGH (ref 0.61–1.24)
Glucose, Bld: 158 mg/dL — ABNORMAL HIGH (ref 70–99)
Glucose, Bld: 174 mg/dL — ABNORMAL HIGH (ref 70–99)
Glucose, Bld: 198 mg/dL — ABNORMAL HIGH (ref 70–99)
Glucose, Bld: 206 mg/dL — ABNORMAL HIGH (ref 70–99)
Glucose, Bld: 163 mg/dL — ABNORMAL HIGH (ref 70–99)
HCT: 21 % — ABNORMAL LOW (ref 39.0–52.0)
HCT: 26 % — ABNORMAL LOW (ref 39.0–52.0)
HCT: 27 % — ABNORMAL LOW (ref 39.0–52.0)
HCT: 30 % — ABNORMAL LOW (ref 39.0–52.0)
HCT: 27 % — ABNORMAL LOW (ref 39.0–52.0)
Hemoglobin: 10.2 g/dL — ABNORMAL LOW (ref 13.0–17.0)
Hemoglobin: 7.1 g/dL — ABNORMAL LOW (ref 13.0–17.0)
Hemoglobin: 8.8 g/dL — ABNORMAL LOW (ref 13.0–17.0)
Hemoglobin: 9.2 g/dL — ABNORMAL LOW (ref 13.0–17.0)
Hemoglobin: 9.2 g/dL — ABNORMAL LOW (ref 13.0–17.0)
Potassium: 4 mmol/L (ref 3.5–5.1)
Potassium: 4.4 mmol/L (ref 3.5–5.1)
Potassium: 4.5 mmol/L (ref 3.5–5.1)
Potassium: 5 mmol/L (ref 3.5–5.1)
Potassium: 4.8 mmol/L (ref 3.5–5.1)
Sodium: 138 mmol/L (ref 135–145)
Sodium: 138 mmol/L (ref 135–145)
Sodium: 140 mmol/L (ref 135–145)
Sodium: 146 mmol/L — ABNORMAL HIGH (ref 135–145)
Sodium: 140 mmol/L (ref 135–145)
TCO2: 23 mmol/L (ref 22–32)
TCO2: 24 mmol/L (ref 22–32)
TCO2: 24 mmol/L (ref 22–32)
TCO2: 33 mmol/L — ABNORMAL HIGH (ref 22–32)
TCO2: 26 mmol/L (ref 22–32)

## 2022-09-18 LAB — BASIC METABOLIC PANEL
Anion gap: 9 (ref 5–15)
Anion gap: 10 (ref 5–15)
BUN: 23 mg/dL (ref 8–23)
BUN: 25 mg/dL — ABNORMAL HIGH (ref 8–23)
CO2: 23 mmol/L (ref 22–32)
CO2: 23 mmol/L (ref 22–32)
Calcium: 8.3 mg/dL — ABNORMAL LOW (ref 8.9–10.3)
Calcium: 8 mg/dL — ABNORMAL LOW (ref 8.9–10.3)
Chloride: 106 mmol/L (ref 98–111)
Chloride: 109 mmol/L (ref 98–111)
Creatinine, Ser: 1.66 mg/dL — ABNORMAL HIGH (ref 0.61–1.24)
Creatinine, Ser: 1.9 mg/dL — ABNORMAL HIGH (ref 0.61–1.24)
GFR, Estimated: 42 mL/min — ABNORMAL LOW (ref >60)
GFR, Estimated: 36 mL/min — ABNORMAL LOW (ref >60)
Glucose, Bld: 139 mg/dL — ABNORMAL HIGH (ref 70–99)
Glucose, Bld: 128 mg/dL — ABNORMAL HIGH (ref 70–99)
Potassium: 4.4 mmol/L (ref 3.5–5.1)
Potassium: 4 mmol/L (ref 3.5–5.1)
Sodium: 138 mmol/L (ref 135–145)
Sodium: 142 mmol/L (ref 135–145)

## 2022-09-18 LAB — PLATELET COUNT: Platelets: 51 10*3/uL — ABNORMAL LOW (ref 150–400)

## 2022-09-18 LAB — CBC
HCT: 29 % — ABNORMAL LOW (ref 39.0–52.0)
HCT: 24.1 % — ABNORMAL LOW (ref 39.0–52.0)
HCT: 22.4 % — ABNORMAL LOW (ref 39.0–52.0)
Hemoglobin: 9.9 g/dL — ABNORMAL LOW (ref 13.0–17.0)
Hemoglobin: 7.9 g/dL — ABNORMAL LOW (ref 13.0–17.0)
Hemoglobin: 7.6 g/dL — ABNORMAL LOW (ref 13.0–17.0)
MCH: 29 pg (ref 26.0–34.0)
MCH: 28.2 pg (ref 26.0–34.0)
MCH: 28.7 pg (ref 26.0–34.0)
MCHC: 34.1 g/dL (ref 30.0–36.0)
MCHC: 32.8 g/dL (ref 30.0–36.0)
MCHC: 33.9 g/dL (ref 30.0–36.0)
MCV: 85 fL (ref 80.0–100.0)
MCV: 86.1 fL (ref 80.0–100.0)
MCV: 84.5 fL (ref 80.0–100.0)
Platelets: 67 10*3/uL — ABNORMAL LOW (ref 150–400)
Platelets: 59 10*3/uL — ABNORMAL LOW (ref 150–400)
Platelets: 68 10*3/uL — ABNORMAL LOW (ref 150–400)
RBC: 3.41 MIL/uL — ABNORMAL LOW (ref 4.22–5.81)
RBC: 2.8 MIL/uL — ABNORMAL LOW (ref 4.22–5.81)
RBC: 2.65 MIL/uL — ABNORMAL LOW (ref 4.22–5.81)
RDW: 14.1 % (ref 11.5–15.5)
RDW: 14.2 % (ref 11.5–15.5)
RDW: 14.9 % (ref 11.5–15.5)
WBC: 16.7 10*3/uL — ABNORMAL HIGH (ref 4.0–10.5)
WBC: 9.4 10*3/uL (ref 4.0–10.5)
WBC: 19.6 10*3/uL — ABNORMAL HIGH (ref 4.0–10.5)
nRBC: 0 % (ref 0.0–0.2)
nRBC: 0 % (ref 0.0–0.2)
nRBC: 0 % (ref 0.0–0.2)

## 2022-09-18 LAB — PREPARE FRESH FROZEN PLASMA
Unit division: 0
Unit division: 0

## 2022-09-18 LAB — BPAM FFP
Blood Product Expiration Date: 202311062359
Blood Product Expiration Date: 202311062359
ISSUE DATE / TIME: 202311041925
ISSUE DATE / TIME: 202311041925
Unit Type and Rh: 6200
Unit Type and Rh: 6200

## 2022-09-18 LAB — HEMOGLOBIN AND HEMATOCRIT, BLOOD
HCT: 26.7 % — ABNORMAL LOW (ref 39.0–52.0)
Hemoglobin: 8.9 g/dL — ABNORMAL LOW (ref 13.0–17.0)

## 2022-09-18 LAB — POCT I-STAT EG7
Acid-base deficit: 3 mmol/L — ABNORMAL HIGH (ref 0.0–2.0)
Bicarbonate: 22.6 mmol/L (ref 20.0–28.0)
Calcium, Ion: 0.95 mmol/L — ABNORMAL LOW (ref 1.15–1.40)
HCT: 26 % — ABNORMAL LOW (ref 39.0–52.0)
Hemoglobin: 8.8 g/dL — ABNORMAL LOW (ref 13.0–17.0)
O2 Saturation: 83 %
Potassium: 4.6 mmol/L (ref 3.5–5.1)
Sodium: 138 mmol/L (ref 135–145)
TCO2: 24 mmol/L (ref 22–32)
pCO2, Ven: 42.1 mmHg — ABNORMAL LOW (ref 44–60)
pH, Ven: 7.337 (ref 7.25–7.43)
pO2, Ven: 50 mmHg — ABNORMAL HIGH (ref 32–45)

## 2022-09-18 LAB — DIC (DISSEMINATED INTRAVASCULAR COAGULATION)PANEL
D-Dimer, Quant: >20 ug/mL-FEU — ABNORMAL HIGH (ref 0.00–0.50)
Fibrinogen: 174 mg/dL — ABNORMAL LOW (ref 210–475)
INR: 1.1 (ref 0.8–1.2)
Platelets: 69 10*3/uL — ABNORMAL LOW (ref 150–400)
Prothrombin Time: 13.8 seconds (ref 11.4–15.2)
Smear Review: NONE SEEN
aPTT: 38 seconds — ABNORMAL HIGH (ref 24–36)

## 2022-09-18 LAB — GLUCOSE, CAPILLARY
Glucose-Capillary: 157 mg/dL — ABNORMAL HIGH (ref 70–99)
Glucose-Capillary: 157 mg/dL — ABNORMAL HIGH (ref 70–99)
Glucose-Capillary: 151 mg/dL — ABNORMAL HIGH (ref 70–99)
Glucose-Capillary: 141 mg/dL — ABNORMAL HIGH (ref 70–99)
Glucose-Capillary: 152 mg/dL — ABNORMAL HIGH (ref 70–99)
Glucose-Capillary: 137 mg/dL — ABNORMAL HIGH (ref 70–99)
Glucose-Capillary: 162 mg/dL — ABNORMAL HIGH (ref 70–99)
Glucose-Capillary: 155 mg/dL — ABNORMAL HIGH (ref 70–99)
Glucose-Capillary: 144 mg/dL — ABNORMAL HIGH (ref 70–99)
Glucose-Capillary: 147 mg/dL — ABNORMAL HIGH (ref 70–99)
Glucose-Capillary: 129 mg/dL — ABNORMAL HIGH (ref 70–99)
Glucose-Capillary: 151 mg/dL — ABNORMAL HIGH (ref 70–99)
Glucose-Capillary: 168 mg/dL — ABNORMAL HIGH (ref 70–99)
Glucose-Capillary: 134 mg/dL — ABNORMAL HIGH (ref 70–99)
Glucose-Capillary: 141 mg/dL — ABNORMAL HIGH (ref 70–99)
Glucose-Capillary: 151 mg/dL — ABNORMAL HIGH (ref 70–99)
Glucose-Capillary: 125 mg/dL — ABNORMAL HIGH (ref 70–99)
Glucose-Capillary: 137 mg/dL — ABNORMAL HIGH (ref 70–99)
Glucose-Capillary: 151 mg/dL — ABNORMAL HIGH (ref 70–99)

## 2022-09-18 LAB — ECHO INTRAOPERATIVE TEE
S' Lateral: 3.45 cm
Weight: 2927.71 [oz_av]

## 2022-09-18 LAB — SURGICAL PCR SCREEN
MRSA, PCR: NEGATIVE
Staphylococcus aureus: POSITIVE — AB

## 2022-09-18 LAB — FIBRINOGEN: Fibrinogen: <60 mg/dL — CL (ref 210–475)

## 2022-09-18 LAB — MAGNESIUM
Magnesium: 2.9 mg/dL — ABNORMAL HIGH (ref 1.7–2.4)
Magnesium: 2.8 mg/dL — ABNORMAL HIGH (ref 1.7–2.4)

## 2022-09-18 LAB — BLOOD PRODUCT ORDER (VERBAL) VERIFICATION

## 2022-09-18 MED ORDER — NOREPINEPHRINE 4 MG/250ML-% IV SOLN: 0.0000 ug/min | INTRAVENOUS | Status: DC

## 2022-09-18 MED ORDER — POTASSIUM CHLORIDE 10 MEQ/50ML IV SOLN: 10.0000 meq | INTRAVENOUS | Status: AC

## 2022-09-18 MED ORDER — MUPIROCIN 2 % EX OINT
1.0000 | TOPICAL_OINTMENT | Freq: Two times a day (BID) | CUTANEOUS | Status: AC
  Administered 2022-09-18 – 2022-09-22 (×9): 1 via NASAL
  Filled 2022-09-18 (×2): qty 22

## 2022-09-18 MED ORDER — METOPROLOL TARTRATE 5 MG/5ML IV SOLN
2.5000 mg | INTRAVENOUS | Status: DC | PRN
  Filled 2022-09-18: qty 5

## 2022-09-18 MED ORDER — ACETAMINOPHEN 160 MG/5ML PO SOLN
1000.0000 mg | Freq: Four times a day (QID) | ORAL | Status: DC
  Administered 2022-09-18 – 2022-09-22 (×13): 1000 mg
  Filled 2022-09-18 (×14): qty 40.6

## 2022-09-18 MED ORDER — SODIUM CHLORIDE 0.9% FLUSH
10.0000 mL | Freq: Two times a day (BID) | INTRAVENOUS | Status: DC
  Administered 2022-09-18 – 2022-09-19 (×3): 10 mL
  Administered 2022-09-21: 20 mL
  Administered 2022-09-22: 10 mL
  Administered 2022-09-22: 20 mL
  Administered 2022-09-23 – 2022-09-30 (×12): 10 mL

## 2022-09-18 MED ORDER — MAGNESIUM SULFATE 4 GM/100ML IV SOLN
4.0000 g | Freq: Once | INTRAVENOUS | Status: AC
  Administered 2022-09-18: 4 g via INTRAVENOUS
  Filled 2022-09-18: qty 100

## 2022-09-18 MED ORDER — CALCIUM CHLORIDE 10 % IV SOLN
1.0000 g | Freq: Once | INTRAVENOUS | Status: AC
  Administered 2022-09-18: 1 g via INTRAVENOUS

## 2022-09-18 MED ORDER — ASPIRIN 81 MG PO CHEW
324.0000 mg | CHEWABLE_TABLET | Freq: Every day | ORAL | Status: DC
  Administered 2022-09-19: 324 mg
  Filled 2022-09-18: qty 4

## 2022-09-18 MED ORDER — BISACODYL 5 MG PO TBEC: 10.0000 mg | DELAYED_RELEASE_TABLET | Freq: Every day | ORAL | Status: DC

## 2022-09-18 MED ORDER — ALBUMIN HUMAN 5 % IV SOLN
12.5000 g | Freq: Once | INTRAVENOUS | Status: AC
  Administered 2022-09-18: 12.5 g via INTRAVENOUS
  Filled 2022-09-18: qty 250

## 2022-09-18 MED ORDER — ONDANSETRON HCL 4 MG/2ML IJ SOLN: 4.0000 mg | Freq: Four times a day (QID) | INTRAMUSCULAR | Status: DC | PRN

## 2022-09-18 MED ORDER — PHENYLEPHRINE HCL-NACL 20-0.9 MG/250ML-% IV SOLN
0.0000 ug/min | INTRAVENOUS | Status: DC
  Administered 2022-09-18: 10 ug/min via INTRAVENOUS

## 2022-09-18 MED ORDER — ACETAMINOPHEN 500 MG PO TABS
1000.0000 mg | ORAL_TABLET | Freq: Four times a day (QID) | ORAL | Status: DC
  Administered 2022-09-19 – 2022-09-21 (×5): 1000 mg via ORAL
  Filled 2022-09-18: qty 2

## 2022-09-18 MED ORDER — ACETAMINOPHEN 160 MG/5ML PO SOLN
650.0000 mg | Freq: Once | ORAL | Status: DC
  Filled 2022-09-18: qty 20.3

## 2022-09-18 MED ORDER — PANTOPRAZOLE SODIUM 40 MG PO TBEC: 40.0000 mg | DELAYED_RELEASE_TABLET | Freq: Every day | ORAL | Status: DC

## 2022-09-18 MED ORDER — BISACODYL 10 MG RE SUPP
10.0000 mg | Freq: Every day | RECTAL | Status: DC
  Administered 2022-09-19 – 2022-09-24 (×5): 10 mg via RECTAL
  Filled 2022-09-18 (×5): qty 1

## 2022-09-18 MED ORDER — SODIUM CHLORIDE 0.9% FLUSH
3.0000 mL | Freq: Two times a day (BID) | INTRAVENOUS | Status: DC
  Administered 2022-09-19 – 2022-09-30 (×18): 3 mL via INTRAVENOUS

## 2022-09-18 MED ORDER — OXYCODONE HCL 5 MG PO TABS
5.0000 mg | ORAL_TABLET | ORAL | Status: DC | PRN
  Administered 2022-09-21: 10 mg via ORAL
  Filled 2022-09-18: qty 2

## 2022-09-18 MED ORDER — NITROGLYCERIN IN D5W 200-5 MCG/ML-% IV SOLN: 0.0000 ug/min | INTRAVENOUS | Status: DC

## 2022-09-18 MED ORDER — MILRINONE LACTATE IN DEXTROSE 20-5 MG/100ML-% IV SOLN: 0.3000 ug/kg/min | INTRAVENOUS | Status: DC

## 2022-09-18 MED ORDER — SODIUM CHLORIDE 0.9% IV SOLUTION: Freq: Once | INTRAVENOUS | Status: AC

## 2022-09-18 MED ORDER — ALBUMIN HUMAN 5 % IV SOLN
250.0000 mL | INTRAVENOUS | Status: AC | PRN
  Administered 2022-09-18 (×4): 12.5 g via INTRAVENOUS
  Filled 2022-09-18: qty 250

## 2022-09-18 MED ORDER — LACTATED RINGERS IV SOLN: INTRAVENOUS | Status: DC

## 2022-09-18 MED ORDER — NICARDIPINE HCL IN NACL 20-0.86 MG/200ML-% IV SOLN
3.0000 mg/h | INTRAVENOUS | Status: DC
  Administered 2022-09-18: 5 mg/h via INTRAVENOUS
  Administered 2022-09-18: 10 mg/h via INTRAVENOUS
  Administered 2022-09-19: 5 mg/h via INTRAVENOUS
  Administered 2022-09-19: 7.5 mg/h via INTRAVENOUS
  Filled 2022-09-18 (×6): qty 200

## 2022-09-18 MED ORDER — ACETAMINOPHEN 650 MG RE SUPP: 650.0000 mg | Freq: Once | RECTAL | Status: DC

## 2022-09-18 MED ORDER — LACTATED RINGERS IV SOLN: 500.0000 mL | Freq: Once | INTRAVENOUS | Status: DC | PRN

## 2022-09-18 MED ORDER — ORAL CARE MOUTH RINSE
15.0000 mL | OROMUCOSAL | Status: DC
  Administered 2022-09-18 – 2022-09-22 (×57): 15 mL via OROMUCOSAL

## 2022-09-18 MED ORDER — FAMOTIDINE IN NACL 20-0.9 MG/50ML-% IV SOLN
20.0000 mg | Freq: Two times a day (BID) | INTRAVENOUS | Status: AC
  Administered 2022-09-18 (×2): 20 mg via INTRAVENOUS
  Filled 2022-09-18 (×2): qty 50

## 2022-09-18 MED ORDER — CHLORHEXIDINE GLUCONATE 0.12 % MT SOLN: 15.0000 mL | Freq: Once | OROMUCOSAL | Status: DC

## 2022-09-18 MED ORDER — ASPIRIN 325 MG PO TBEC: 325.0000 mg | DELAYED_RELEASE_TABLET | Freq: Every day | ORAL | Status: DC

## 2022-09-18 MED ORDER — SODIUM CHLORIDE 0.9% FLUSH: 3.0000 mL | INTRAVENOUS | Status: DC | PRN

## 2022-09-18 MED ORDER — ORAL CARE MOUTH RINSE: 15.0000 mL | OROMUCOSAL | Status: DC | PRN

## 2022-09-18 MED ORDER — DEXMEDETOMIDINE HCL IN NACL 400 MCG/100ML IV SOLN
0.0000 ug/kg/h | INTRAVENOUS | Status: DC
  Administered 2022-09-18: 0.5 ug/kg/h via INTRAVENOUS
  Administered 2022-09-18: 0.7 ug/kg/h via INTRAVENOUS
  Administered 2022-09-18 – 2022-09-20 (×3): 0.2 ug/kg/h via INTRAVENOUS
  Administered 2022-09-21: 0.7 ug/kg/h via INTRAVENOUS
  Filled 2022-09-18 (×4): qty 100

## 2022-09-18 MED ORDER — 0.9 % SODIUM CHLORIDE (POUR BTL) OPTIME
TOPICAL | Status: DC | PRN
  Administered 2022-09-17: 5000 mL

## 2022-09-18 MED ORDER — SODIUM CHLORIDE 0.9 % IV SOLN: INTRAVENOUS | Status: DC

## 2022-09-18 MED ORDER — TRAMADOL HCL 50 MG PO TABS
50.0000 mg | ORAL_TABLET | ORAL | Status: DC | PRN
  Administered 2022-09-26: 50 mg via ORAL
  Filled 2022-09-18: qty 1

## 2022-09-18 MED ORDER — SODIUM CHLORIDE 0.9% FLUSH: 10.0000 mL | INTRAVENOUS | Status: DC | PRN

## 2022-09-18 MED ORDER — PROTAMINE SULFATE 10 MG/ML IV SOLN
INTRAVENOUS | Status: DC | PRN
  Administered 2022-09-18: 10 mg via INTRAVENOUS

## 2022-09-18 MED ORDER — CHLORHEXIDINE GLUCONATE 0.12 % MT SOLN
15.0000 mL | OROMUCOSAL | Status: AC
  Administered 2022-09-18: 15 mL via OROMUCOSAL
  Filled 2022-09-18: qty 15

## 2022-09-18 MED ORDER — DOCUSATE SODIUM 100 MG PO CAPS
200.0000 mg | ORAL_CAPSULE | Freq: Every day | ORAL | Status: DC
  Filled 2022-09-18: qty 2

## 2022-09-18 MED ORDER — CEFAZOLIN SODIUM-DEXTROSE 2-4 GM/100ML-% IV SOLN
2.0000 g | Freq: Three times a day (TID) | INTRAVENOUS | Status: AC
  Administered 2022-09-18 – 2022-09-19 (×6): 2 g via INTRAVENOUS
  Filled 2022-09-18 (×6): qty 100

## 2022-09-18 MED ORDER — COAGULATION FACTOR VIIA RECOMB 1 MG IV SOLR
90.0000 ug/kg | Freq: Once | INTRAVENOUS | Status: AC
  Administered 2022-09-18: 7000 ug via INTRAVENOUS
  Filled 2022-09-18: qty 5

## 2022-09-18 MED ORDER — VANCOMYCIN HCL IN DEXTROSE 1-5 GM/200ML-% IV SOLN
1000.0000 mg | Freq: Once | INTRAVENOUS | Status: AC
  Administered 2022-09-18: 1000 mg via INTRAVENOUS
  Filled 2022-09-18: qty 200

## 2022-09-18 MED ORDER — SODIUM CHLORIDE 0.9 % IV SOLN: 250.0000 mL | INTRAVENOUS | Status: DC

## 2022-09-18 MED ORDER — SODIUM CHLORIDE 0.9% IV SOLUTION: Freq: Once | INTRAVENOUS | Status: DC

## 2022-09-18 MED ORDER — CHLORHEXIDINE GLUCONATE CLOTH 2 % EX PADS
6.0000 | MEDICATED_PAD | Freq: Every day | CUTANEOUS | Status: DC
  Administered 2022-09-18 – 2022-10-03 (×16): 6 via TOPICAL

## 2022-09-18 MED ORDER — INSULIN REGULAR(HUMAN) IN NACL 100-0.9 UT/100ML-% IV SOLN
INTRAVENOUS | Status: DC
  Administered 2022-09-18: 2 [IU]/h via INTRAVENOUS

## 2022-09-18 MED ORDER — DEXTROSE 50 % IV SOLN: 0.0000 mL | INTRAVENOUS | Status: DC | PRN

## 2022-09-18 MED ORDER — POTASSIUM CHLORIDE 10 MEQ/50ML IV SOLN
10.0000 meq | INTRAVENOUS | Status: AC
  Administered 2022-09-18 (×3): 10 meq via INTRAVENOUS
  Filled 2022-09-18 (×3): qty 50

## 2022-09-18 MED ORDER — METOPROLOL TARTRATE 25 MG/10 ML ORAL SUSPENSION
12.5000 mg | Freq: Two times a day (BID) | ORAL | Status: DC
  Administered 2022-09-18 – 2022-09-28 (×14): 12.5 mg
  Filled 2022-09-18 (×18): qty 10

## 2022-09-18 MED ORDER — SODIUM CHLORIDE 0.45 % IV SOLN: INTRAVENOUS | Status: DC | PRN

## 2022-09-18 MED ORDER — MORPHINE SULFATE (PF) 2 MG/ML IV SOLN
1.0000 mg | INTRAVENOUS | Status: DC | PRN
  Administered 2022-09-18 (×3): 2 mg via INTRAVENOUS
  Administered 2022-09-20 – 2022-09-21 (×5): 1 mg via INTRAVENOUS
  Administered 2022-09-21: 2 mg via INTRAVENOUS
  Filled 2022-09-18 (×10): qty 1

## 2022-09-18 MED ORDER — MIDAZOLAM HCL 2 MG/2ML IJ SOLN
2.0000 mg | INTRAMUSCULAR | Status: DC | PRN
  Administered 2022-09-19 – 2022-09-20 (×2): 2 mg via INTRAVENOUS
  Filled 2022-09-18 (×2): qty 2

## 2022-09-18 MED ORDER — METOPROLOL TARTRATE 12.5 MG HALF TABLET
12.5000 mg | ORAL_TABLET | Freq: Two times a day (BID) | ORAL | Status: DC
  Administered 2022-09-19 – 2022-09-24 (×3): 12.5 mg via ORAL
  Filled 2022-09-18 (×2): qty 1

## 2022-09-18 NOTE — Progress Notes (Signed)
Cardiac rapid wean protocol initiated at this time 

## 2022-09-18 NOTE — Progress Notes (Signed)
      [address]       [place], [address]             [phone]      Intubated  Moving and following come simple commands but drifts off quickly. Moves all 4 ext with preference for right side, grips weakly with right hand.  BP (!) 118/58   Pulse 80   Temp 98.8 F (37.1 C)   Resp 18   Ht 6' (1.829 m)   Wt 83 kg   SpO2 98%   BMI 24.82 kg/m   Intake/Output Summary (Last 24 hours) at 09/18/2022 1736 Last data filed at 09/18/2022 1600 Gross per 24 hour  Intake 3608.06 ml  Output 3240 ml  Net 368.06 ml   Currently hypertensive with systolics in 026-378 range- will start Cardene drip  K= 3.7- will supplement Hgb 7.5  James Estrada C. Roxan Hockey, MD [facility] [phone]

## 2022-09-18 NOTE — Brief Op Note (Signed)
09/17/2022  6:58 AM  PATIENT:  Alric Seton  81 y.o. male  PRE-OPERATIVE DIAGNOSIS:  Type 1 Aortic Dissection  POST-OPERATIVE DIAGNOSIS:  Type 1 Aortic Dissection  PROCEDURE:  HEMIARCH REPAIR OF ACUTE ASCENDING THORACIC AORTIC DISSECTION with a 30 mm HEMASHIELD GRAFT USING MODERATE HYPOTHERMIC CIRCULATORY ARREST  SURGEON:   [name], MD - Primary  PHYSICIAN ASSISTANT: Roddenberry  ASSISTANTS: Sammie Bench, RN, Scrub Person  Price, Annabell Howells, RN, RN First Assistant   ANESTHESIA:   general  EBL: massive  BLOOD ADMINISTERED:  Multiple units of PRBC's, Plts, FFP, and Cryoprecipitate  DRAINS:  Mediastinal tubes    LOCAL MEDICATIONS USED:  NONE  SPECIMEN:  Portion of dissected ascending aorta  DISPOSITION OF SPECIMEN:  PATHOLOGY  COUNTS:  Correct  DICTATION: .Dragon Dictation  PLAN OF CARE: Admit to inpatient   PATIENT DISPOSITION:  ICU - intubated and critically ill.   Delay start of Pharmacological VTE agent (>24hrs) due to surgical blood loss or risk of bleeding: yes

## 2022-09-18 NOTE — Progress Notes (Signed)
Pt unable to open eyes at this time and follow command for NIF/VC. Pt placed back on full support and will attempt to extubate when pt is more awake and following commands

## 2022-09-18 NOTE — Progress Notes (Signed)
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 Family is at the bedside.   Objective: Current vital signs: BP (!) 118/58   Pulse 80   Temp 99 F (37.2 C) (Core)   Resp 12   Ht 6' (1.829 m)   Wt 83 kg   SpO2 92%   BMI 24.82 kg/m  Vital signs in last 24 hours: Temp:  [95.5 F (35.3 C)-99.1 F (37.3 C)] 99 F (37.2 C) (11/05 0800) Pulse Rate:  [53-81] 80 (11/05 0836) Resp:  [12-20] 12 (11/05 0836) BP: (75-153)/(48-80) 118/58 (11/05 0500) SpO2:  [92 %-100 %] 92 % (11/05 0836) Arterial Line BP: (98-145)/(49-74) 143/65 (11/05 0800) FiO2 (%):  [40 %-50 %] 40 % (11/05 0919) Weight:  [83 kg] 83 kg (11/04 1843)  Intake/Output from previous day: 11/04 0701 - 11/05 0700 In: 2710.6 [I.V.:1631.3; Blood:333.3; IV Piggyback:746] Out: 2135 [Urine:1455; Chest Tube:680] Intake/Output this shift: Total I/O In: -  Out: 410 [Urine:250; Chest Tube:160] Nutritional status:  Diet Order     None      HEENT: [place]/AT Lungs: Intubated Ext: Multiple lines in place  Neurologic Exam: Ment: Will localize purposefully to face by scratching with RUE. Moves LUE spontaneously. Not following commands. Eyes closed and do not open to light pinch stimuli or voice. No attempts to communicate.  CN: Corneal reflexes intact. PERRL. Face flaccidly symmetric in the context of intubation.  Motor/Sensory: Will localize purposefully to face by scratching with RUE. Moves LUE spontaneously. No movement of either lower extremity to light noxious stimuli.  Reflexes: 1+ bilateral brachioradialis. Hypoactive BLE reflexes. Toes mute.  Cerebellar/Gait: Unable to assess  Lab Results: Results for orders placed or performed during the hospital encounter of 09/17/22 (from the past 48 hour(s))  Protime-INR     Status: Abnormal   Collection Time: 09/17/22  5:22 PM  Result Value Ref Range    Prothrombin Time 15.5 (H) 11.4 - 15.2 seconds   INR 1.2 0.8 - 1.2    Comment: (NOTE) INR goal varies based on device and disease states. Performed at [facility], [place] [address], [place], [place] [address]   APTT     Status: None   Collection Time: 09/17/22  5:22 PM  Result Value Ref Range   aPTT 30 24 - 36 seconds    Comment: Performed at [facility] [address] [place], [place] [address]  CBC     Status: Abnormal   Collection Time: 09/17/22  5:22 PM  Result Value Ref Range   WBC 13.3 (H) 4.0 - 10.5 K/uL   RBC 4.91 4.22 - 5.81 MIL/uL   Hemoglobin 13.8 13.0 - 17.0 g/dL   HCT 43.0 39.0 - 52.0 %   MCV 87.6 80.0 - 100.0 fL   MCH 28.1 26.0 - 34.0 pg   MCHC 32.1 30.0 - 36.0 g/dL   RDW 12.8 11.5 - 15.5 %   Platelets 115 (L) 150 - 400 K/uL    Comment: REPEATED TO VERIFY   nRBC 0.0 0.0 - 0.2 %    Comment: Performed at [facility], [place] [address] [place], [place] [address]  Differential     Status: Abnormal   Collection Time: 09/17/22  5:22 PM  Result Value Ref Range   Neutrophils Relative % 72 %   Neutro Abs 9.5 (H) 1.7 - 7.7 K/uL   Lymphocytes Relative 17 %   Lymphs  Abs 2.3 0.7 - 4.0 K/uL   Monocytes Relative 8 %   Monocytes Absolute 1.1 (H) 0.1 - 1.0 K/uL   Eosinophils Relative 1 %   Eosinophils Absolute 0.2 0.0 - 0.5 K/uL   Basophils Relative 1 %   Basophils Absolute 0.1 0.0 - 0.1 K/uL   Immature Granulocytes 1 %   Abs Immature Granulocytes 0.19 (H) 0.00 - 0.07 K/uL    Comment: Performed at [facility] [address] [place], [place] [address]  Comprehensive metabolic panel     Status: Abnormal   Collection Time: 09/17/22  5:22 PM  Result Value Ref Range   Sodium 139 135 - 145 mmol/L   Potassium 3.5 3.5 - 5.1 mmol/L   Chloride 105 98 - 111 mmol/L   CO2 23 22 - 32 mmol/L   Glucose, Bld 156 (H) 70 - 99 mg/dL    Comment: Glucose reference range applies only to samples taken after fasting for at least 8 hours.   BUN 21 8 - 23 mg/dL    Creatinine, Ser 1.30 (H) 0.61 - 1.24 mg/dL   Calcium 8.7 (L) 8.9 - 10.3 mg/dL   Total Protein 5.6 (L) 6.5 - 8.1 g/dL   Albumin 3.4 (L) 3.5 - 5.0 g/dL   AST 20 15 - 41 U/L   ALT 13 0 - 44 U/L   Alkaline Phosphatase 45 38 - 126 U/L   Total Bilirubin 0.8 0.3 - 1.2 mg/dL   GFR, Estimated 56 (L) >60 mL/min    Comment: (NOTE) Calculated using the CKD-EPI Creatinine Equation (2021)    Anion gap 11 5 - 15    Comment: Performed at [facility], [place] [address] [place], [place] [address]  Troponin I (High Sensitivity)     Status: None   Collection Time: 09/17/22  5:22 PM  Result Value Ref Range   Troponin I (High Sensitivity) 7 <18 ng/L    Comment: (NOTE) Elevated high sensitivity troponin I (hsTnI) values and significant  changes across serial measurements may suggest ACS but many other  chronic and acute conditions are known to elevate hsTnI results.  Refer to the "Links" section for chest pain algorithms and additional  guidance. Performed at [facility], [place] [address], [place], [place] [address]   Ethanol     Status: None   Collection Time: 09/17/22  5:33 PM  Result Value Ref Range   Alcohol, Ethyl (B) <10 <10 mg/dL    Comment: (NOTE) Lowest detectable limit for serum alcohol is 10 mg/dL.  For medical purposes only. Performed at [facility], [place] [address], [place], [place] [address]   I-stat chem 8, ED     Status: Abnormal   Collection Time: 09/17/22  5:43 PM  Result Value Ref Range   Sodium 139 135 - 145 mmol/L   Potassium 3.6 3.5 - 5.1 mmol/L   Chloride 102 98 - 111 mmol/L   BUN 23 8 - 23 mg/dL   Creatinine, Ser 1.20 0.61 - 1.24 mg/dL   Glucose, Bld 161 (H) 70 - 99 mg/dL    Comment: Glucose reference range applies only to samples taken after fasting for at least 8 hours.   Calcium, Ion 1.15 1.15 - 1.40 mmol/L   TCO2 22 22 - 32 mmol/L   Hemoglobin 13.9 13.0 - 17.0 g/dL   HCT 41.0 39.0 - 52.0 %  Type and screen Ordered by PROVIDER DEFAULT      Status: None (Preliminary result)  Collection Time: 09/17/22  6:19 PM  Result Value Ref Range   ABO/RH(D) A POS    Antibody Screen NEG    Sample Expiration 09/20/2022,2359    Unit Number [id number]    Blood Component Type RED CELLS,LR    Unit division 00    Status of Unit DISCARDED    Transfusion Status OK TO TRANSFUSE    Crossmatch Result NOT NEEDED    Unit tag comment VERBAL ORDERS PER DR [facility]    Unit Number [id number]    Blood Component Type RED CELLS,LR    Unit division 00    Status of Unit ISSUED,FINAL    Transfusion Status OK TO TRANSFUSE    Crossmatch Result COMPATIBLE    Unit tag comment VERBAL ORDERS PER DR [facility]    Unit Number [id number]    Blood Component Type RED CELLS,LR    Unit division 00    Status of Unit ISSUED,FINAL    Transfusion Status OK TO TRANSFUSE    Crossmatch Result Compatible    Unit Number [id number]    Blood Component Type RED CELLS,LR    Unit division 00    Status of Unit ISSUED,FINAL    Transfusion Status OK TO TRANSFUSE    Crossmatch Result      Compatible Performed at [facility], [address] [place], [place] [address]    Unit Number [id number]    Blood Component Type RED CELLS,LR    Unit division 00    Status of Unit REL FROM [facility]    Transfusion Status OK TO TRANSFUSE    Crossmatch Result Compatible    Unit Number [id number]    Blood Component Type RBC LR PHER2    Unit division 00    Status of Unit REL FROM [facility]    Transfusion Status OK TO TRANSFUSE    Crossmatch Result Compatible    Unit Number [id number]    Blood Component Type RBC LR PHER2    Unit division 00    Status of Unit REL FROM [facility]    Transfusion Status OK TO TRANSFUSE    Crossmatch Result Compatible    Unit Number [id number]    Blood Component Type RED CELLS,LR    Unit division 00    Status of Unit REL FROM [facility]    Transfusion Status OK TO TRANSFUSE    Crossmatch Result Compatible    Unit Number [id number]     Blood Component Type RED CELLS,LR    Unit division 00    Status of Unit REL FROM [facility]    Transfusion Status OK TO TRANSFUSE    Crossmatch Result Compatible    Unit Number [id number]    Blood Component Type RED CELLS,LR    Unit division 00    Status of Unit REL FROM [facility]    Transfusion Status OK TO TRANSFUSE    Crossmatch Result Compatible    Unit Number [id number]    Blood Component Type RBC LR PHER2    Unit division 00    Status of Unit ALLOCATED    Transfusion Status OK TO TRANSFUSE    Crossmatch Result Compatible    Unit Number [id number]    Blood Component Type RBC LR PHER1    Unit division 00    Status of Unit ALLOCATED    Transfusion Status OK TO TRANSFUSE    Crossmatch Result Compatible    Unit Number [id number]    Blood Component Type RED CELLS,LR    Unit division 00  Status of Unit ALLOCATED    Transfusion Status OK TO TRANSFUSE    Crossmatch Result Compatible    Unit Number [id number]    Blood Component Type RBC LR PHER1    Unit division 00    Status of Unit ALLOCATED    Transfusion Status OK TO TRANSFUSE    Crossmatch Result Compatible   Prepare RBC (crossmatch)     Status: None   Collection Time: 09/17/22  7:02 PM  Result Value Ref Range   Order Confirmation      ORDER PROCESSED BY BLOOD BANK Performed at [facility], [address] [place], [place] [address]   Prepare fresh frozen plasma     Status: None   Collection Time: 09/17/22  7:02 PM  Result Value Ref Range   Unit Number [id number]    Blood Component Type THAWED PLASMA    Unit division 00    Status of Unit ISSUED,FINAL    Transfusion Status      OK TO TRANSFUSE Performed at [facility], [address], [place], [place] [address]    Unit Number [id number]    Blood Component Type THAWED PLASMA    Unit division 00    Status of Unit ISSUED,FINAL    Transfusion Status OK TO TRANSFUSE   I-stat chem 8, ed     Status: Abnormal   Collection Time: 09/17/22   8:06 PM  Result Value Ref Range   Sodium 138 135 - 145 mmol/L   Potassium 3.7 3.5 - 5.1 mmol/L   Chloride 106 98 - 111 mmol/L   BUN 22 8 - 23 mg/dL   Creatinine, Ser 6.01 0.61 - 1.24 mg/dL   Glucose, Bld 093 (H) 70 - 99 mg/dL    Comment: Glucose reference range applies only to samples taken after fasting for at least 8 hours.   Calcium, Ion 1.09 (L) 1.15 - 1.40 mmol/L   TCO2 21 (L) 22 - 32 mmol/L   Hemoglobin 11.2 (L) 13.0 - 17.0 g/dL   HCT 23.5 (L) 57.3 - 22.0 %  I-STAT, chem 8     Status: Abnormal   Collection Time: 09/17/22  9:13 PM  Result Value Ref Range   Sodium 137 135 - 145 mmol/L   Potassium 4.3 3.5 - 5.1 mmol/L   Chloride 106 98 - 111 mmol/L   BUN 24 (H) 8 - 23 mg/dL   Creatinine, Ser 2.54 0.61 - 1.24 mg/dL   Glucose, Bld 270 (H) 70 - 99 mg/dL    Comment: Glucose reference range applies only to samples taken after fasting for at least 8 hours.   Calcium, Ion 1.13 (L) 1.15 - 1.40 mmol/L   TCO2 22 22 - 32 mmol/L   Hemoglobin 11.2 (L) 13.0 - 17.0 g/dL   HCT 62.3 (L) 76.2 - 83.1 %  I-STAT 7, (LYTES, BLD GAS, ICA, H+H)     Status: Abnormal   Collection Time: 09/17/22  9:22 PM  Result Value Ref Range   pH, Arterial 7.354 7.35 - 7.45   pCO2 arterial 42.3 32 - 48 mmHg   pO2, Arterial 333 (H) 83 - 108 mmHg   Bicarbonate 23.6 20.0 - 28.0 mmol/L   TCO2 25 22 - 32 mmol/L   O2 Saturation 100 %   Acid-base deficit 2.0 0.0 - 2.0 mmol/L   Sodium 138 135 - 145 mmol/L   Potassium 4.5 3.5 - 5.1 mmol/L   Calcium, Ion 0.96 (L) 1.15 - 1.40 mmol/L   HCT  27.0 (L) 39.0 - 52.0 %   Hemoglobin 9.2 (L) 13.0 - 17.0 g/dL   Sample type ARTERIAL   POCT I-Stat EG7     Status: Abnormal   Collection Time: 09/17/22  9:26 PM  Result Value Ref Range   pH, Ven 7.337 7.25 - 7.43   pCO2, Ven 42.1 (L) 44 - 60 mmHg   pO2, Ven 50 (H) 32 - 45 mmHg   Bicarbonate 22.6 20.0 - 28.0 mmol/L   TCO2 24 22 - 32 mmol/L   O2 Saturation 83 %   Acid-base deficit 3.0 (H) 0.0 - 2.0 mmol/L   Sodium 138 135 - 145  mmol/L   Potassium 4.6 3.5 - 5.1 mmol/L   Calcium, Ion 0.95 (L) 1.15 - 1.40 mmol/L   HCT 26.0 (L) 39.0 - 52.0 %   Hemoglobin 8.8 (L) 13.0 - 17.0 g/dL   Sample type VENOUS   I-STAT, chem 8     Status: Abnormal   Collection Time: 09/17/22  9:58 PM  Result Value Ref Range   Sodium 138 135 - 145 mmol/L   Potassium 4.4 3.5 - 5.1 mmol/L   Chloride 105 98 - 111 mmol/L   BUN 26 (H) 8 - 23 mg/dL   Creatinine, Ser 1.20 0.61 - 1.24 mg/dL   Glucose, Bld 158 (H) 70 - 99 mg/dL    Comment: Glucose reference range applies only to samples taken after fasting for at least 8 hours.   Calcium, Ion 0.99 (L) 1.15 - 1.40 mmol/L   TCO2 24 22 - 32 mmol/L   Hemoglobin 9.2 (L) 13.0 - 17.0 g/dL   HCT 27.0 (L) 39.0 - 52.0 %  I-STAT 7, (LYTES, BLD GAS, ICA, H+H)     Status: Abnormal   Collection Time: 09/17/22 11:06 PM  Result Value Ref Range   pH, Arterial 7.287 (L) 7.35 - 7.45   pCO2 arterial 54.3 (H) 32 - 48 mmHg   pO2, Arterial 635 (H) 83 - 108 mmHg   Bicarbonate 25.9 20.0 - 28.0 mmol/L   TCO2 28 22 - 32 mmol/L   O2 Saturation 100 %   Acid-base deficit 1.0 0.0 - 2.0 mmol/L   Sodium 138 135 - 145 mmol/L   Potassium 4.6 3.5 - 5.1 mmol/L   Calcium, Ion 0.94 (L) 1.15 - 1.40 mmol/L   HCT 21.0 (L) 39.0 - 52.0 %   Hemoglobin 7.1 (L) 13.0 - 17.0 g/dL   Sample type ARTERIAL   I-STAT, chem 8     Status: Abnormal   Collection Time: 09/17/22 11:15 PM  Result Value Ref Range   Sodium 146 (H) 135 - 145 mmol/L   Potassium 4.0 3.5 - 5.1 mmol/L   Chloride 101 98 - 111 mmol/L   BUN 26 (H) 8 - 23 mg/dL   Creatinine, Ser 1.10 0.61 - 1.24 mg/dL   Glucose, Bld 174 (H) 70 - 99 mg/dL    Comment: Glucose reference range applies only to samples taken after fasting for at least 8 hours.   Calcium, Ion 0.85 (LL) 1.15 - 1.40 mmol/L   TCO2 33 (H) 22 - 32 mmol/L   Hemoglobin 7.1 (L) 13.0 - 17.0 g/dL   HCT 21.0 (L) 39.0 - 52.0 %  I-STAT 7, (LYTES, BLD GAS, ICA, H+H)     Status: Abnormal   Collection Time: 09/17/22 11:33 PM   Result Value Ref Range   pH, Arterial 7.451 (H) 7.35 - 7.45   pCO2 arterial 31.8 (L) 32 - 48 mmHg  pO2, Arterial 275 (H) 83 - 108 mmHg   Bicarbonate 22.1 20.0 - 28.0 mmol/L   TCO2 23 22 - 32 mmol/L   O2 Saturation 100 %   Acid-base deficit 2.0 0.0 - 2.0 mmol/L   Sodium 139 135 - 145 mmol/L   Potassium 4.7 3.5 - 5.1 mmol/L   Calcium, Ion 0.92 (L) 1.15 - 1.40 mmol/L   HCT 20.0 (L) 39.0 - 52.0 %   Hemoglobin 6.8 (LL) 13.0 - 17.0 g/dL   Sample type ARTERIAL   Hemoglobin and hematocrit, blood     Status: Abnormal   Collection Time: 09/17/22 11:53 PM  Result Value Ref Range   Hemoglobin 8.9 (L) 13.0 - 17.0 g/dL    Comment: REPEATED TO VERIFY   HCT 26.7 (L) 39.0 - 52.0 %    Comment: Performed at [facility], [address] [place], [place] [address]  Platelet count     Status: Abnormal   Collection Time: 09/17/22 11:53 PM  Result Value Ref Range   Platelets 51 (L) 150 - 400 K/uL    Comment: SPECIMEN CHECKED FOR CLOTS Immature Platelet Fraction may be clinically indicated, consider ordering this additional test [id number] REPEATED TO VERIFY RESULT CALLED TO, READ BACK BY AND VERIFIED WITH: CALLED TO MONICA BROOKE @ 0012 JOHN C Performed at [facility], [address], [place], [place] [address]   I-STAT, [place] 8     Status: Abnormal   Collection Time: 09/18/22 12:02 AM  Result Value Ref Range   Sodium 140 135 - 145 mmol/L   Potassium 4.5 3.5 - 5.1 mmol/L   Chloride 103 98 - 111 mmol/L   BUN 27 (H) 8 - 23 mg/dL   Creatinine, Ser 5.68 0.61 - 1.24 mg/dL   Glucose, Bld 616 (H) 70 - 99 mg/dL    Comment: Glucose reference range applies only to samples taken after fasting for at least 8 hours.   Calcium, Ion 0.84 (LL) 1.15 - 1.40 mmol/L   TCO2 24 22 - 32 mmol/L   Hemoglobin 8.8 (L) 13.0 - 17.0 g/dL   HCT 83.7 (L) 29.0 - 21.1 %  Prepare fresh frozen plasma     Status: None (Preliminary result)   Collection Time: 09/18/22 12:08 AM  Result Value Ref Range   Unit  Number [id number]    Blood Component Type THW PLS APHR    Unit division A0    Status of Unit ISSUED    Transfusion Status OK TO TRANSFUSE    Unit Number [id number]    Blood Component Type THW PLS APHR    Unit division B0    Status of Unit ISSUED    Transfusion Status      OK TO TRANSFUSE Performed at [facility], [address] [place], [place] [address]   Prepare platelet pheresis     Status: None (Preliminary result)   Collection Time: 09/18/22 12:14 AM  Result Value Ref Range   Unit Number [id number]    Blood Component Type PLTP2 PSORALEN TREATED    Unit division 00    Status of Unit ISSUED    Transfusion Status OK TO TRANSFUSE    Unit Number [id number]    Blood Component Type PLTP2 PSORALEN TREATED    Unit division 00    Status of Unit ISSUED    Transfusion Status OK TO TRANSFUSE   I-STAT 7, (LYTES, BLD GAS, ICA, H+H)     Status: Abnormal   Collection Time: 09/18/22 12:29  AM  Result Value Ref Range   pH, Arterial 7.372 7.35 - 7.45   pCO2 arterial 39.9 32 - 48 mmHg   pO2, Arterial 259 (H) 83 - 108 mmHg   Bicarbonate 23.2 20.0 - 28.0 mmol/L   TCO2 24 22 - 32 mmol/L   O2 Saturation 100 %   Acid-base deficit 2.0 0.0 - 2.0 mmol/L   Sodium 138 135 - 145 mmol/L   Potassium 5.1 3.5 - 5.1 mmol/L   Calcium, Ion 0.91 (L) 1.15 - 1.40 mmol/L   HCT 26.0 (L) 39.0 - 52.0 %   Hemoglobin 8.8 (L) 13.0 - 17.0 g/dL   Sample type ARTERIAL   Fibrinogen     Status: Abnormal   Collection Time: 09/18/22 12:38 AM  Result Value Ref Range   Fibrinogen <60 (LL) 210 - 475 mg/dL    Comment: REPEATED TO VERIFY CRITICAL RESULT CALLED TO, READ BACK BY AND VERIFIED WITH: WENDY CHRISSMAN, CCP  09/18/2022 0138 BTAYLOR Performed at [facility], [address], [place], [place] [address]   Prepare cryoprecipitate     Status: None (Preliminary result)   Collection Time: 09/18/22 12:41 AM  Result Value Ref Range   Unit Number [id number]    Blood Component Type CRYPOOL  THAW    Unit division 00    Status of Unit ISSUED    Transfusion Status      OK TO TRANSFUSE Performed at [facility] [address], [place], [place] [address]    Unit Number [id number]    Blood Component Type CRYPOOL THAW    Unit division 00    Status of Unit ISSUED    Transfusion Status OK TO TRANSFUSE   I-STAT 7, (LYTES, BLD GAS, ICA, H+H)     Status: Abnormal   Collection Time: 09/18/22  1:12 AM  Result Value Ref Range   pH, Arterial 7.291 (L) 7.35 - 7.45   pCO2 arterial 44.1 32 - 48 mmHg   pO2, Arterial 214 (H) 83 - 108 mmHg   Bicarbonate 21.2 20.0 - 28.0 mmol/L   TCO2 23 22 - 32 mmol/L   O2 Saturation 100 %   Acid-base deficit 5.0 (H) 0.0 - 2.0 mmol/L   Sodium 138 135 - 145 mmol/L   Potassium 5.0 3.5 - 5.1 mmol/L   Calcium, Ion 1.46 (H) 1.15 - 1.40 mmol/L   HCT 28.0 (L) 39.0 - 52.0 %   Hemoglobin 9.5 (L) 13.0 - 17.0 g/dL   Sample type ARTERIAL   I-STAT, chem 8     Status: Abnormal   Collection Time: 09/18/22  1:17 AM  Result Value Ref Range   Sodium 138 135 - 145 mmol/L   Potassium 5.0 3.5 - 5.1 mmol/L   Chloride 105 98 - 111 mmol/L   BUN 26 (H) 8 - 23 mg/dL   Creatinine, Ser 1.20 0.61 - 1.24 mg/dL   Glucose, Bld 198 (H) 70 - 99 mg/dL    Comment: Glucose reference range applies only to samples taken after fasting for at least 8 hours.   Calcium, Ion 1.45 (H) 1.15 - 1.40 mmol/L   TCO2 23 22 - 32 mmol/L   Hemoglobin 10.2 (L) 13.0 - 17.0 g/dL   HCT 30.0 (L) 39.0 - 52.0 %  Glucose, capillary     Status: Abnormal   Collection Time: 09/18/22  2:12 AM  Result Value Ref Range   Glucose-Capillary 157 (H) 70 - 99 mg/dL    Comment: Glucose reference range applies only  to samples taken after fasting for at least 8 hours.  Surgical PCR screen     Status: Abnormal   Collection Time: 09/18/22  2:16 AM   Specimen: Nasal Mucosa; Nasal Swab  Result Value Ref Range   MRSA, PCR NEGATIVE NEGATIVE   Staphylococcus aureus POSITIVE (A) NEGATIVE    Comment: (NOTE) The  Xpert SA Assay (FDA approved for NASAL specimens in patients 75 years of age and older), is one component of a comprehensive surveillance program. It is not intended to diagnose infection nor to guide or monitor treatment. Performed at [facility], [place] [address], [place], [place] [address]   CBC     Status: Abnormal   Collection Time: 09/18/22  2:20 AM  Result Value Ref Range   WBC 16.7 (H) 4.0 - 10.5 K/uL   RBC 3.41 (L) 4.22 - 5.81 MIL/uL   Hemoglobin 9.9 (L) 13.0 - 17.0 g/dL   HCT 29.0 (L) 39.0 - 52.0 %   MCV 85.0 80.0 - 100.0 fL   MCH 29.0 26.0 - 34.0 pg   MCHC 34.1 30.0 - 36.0 g/dL   RDW 14.1 11.5 - 15.5 %   Platelets 67 (L) 150 - 400 K/uL    Comment: Immature Platelet Fraction may be clinically indicated, consider ordering this additional test [id number] REPEATED TO VERIFY    nRBC 0.0 0.0 - 0.2 %    Comment: Performed at [facility], [place] [address] [place], [place] [address]  DIC Panel ONCE - STAT     Status: Abnormal   Collection Time: 09/18/22  2:20 AM  Result Value Ref Range   Prothrombin Time 13.8 11.4 - 15.2 seconds   INR 1.1 0.8 - 1.2    Comment: (NOTE) INR goal varies based on device and disease states.    aPTT 38 (H) 24 - 36 seconds    Comment:        IF BASELINE aPTT IS ELEVATED, SUGGEST PATIENT RISK ASSESSMENT BE USED TO DETERMINE APPROPRIATE ANTICOAGULANT THERAPY.    Fibrinogen 174 (L) 210 - 475 mg/dL    Comment: (NOTE) Fibrinogen results may be underestimated in patients receiving thrombolytic therapy.    D-Dimer, Quant >20.00 (H) 0.00 - 0.50 ug/mL-FEU    Comment: (NOTE) At the manufacturer cut-off value of 0.5 g/mL FEU, this assay has a negative predictive value of 95-100%.This assay is intended for use in conjunction with a clinical pretest probability (PTP) assessment model to exclude pulmonary embolism (PE) and deep venous thrombosis (DVT) in outpatients suspected of PE or DVT. Results should be correlated with clinical  presentation.    Platelets 69 (L) 150 - 400 K/uL    Comment: Immature Platelet Fraction may be clinically indicated, consider ordering this additional test [id number] REPEATED TO VERIFY    Smear Review NO SCHISTOCYTES SEEN     Comment: Performed at [facility], [place] [address], [place], [place] [address]  I-STAT, [place] 8     Status: Abnormal   Collection Time: 09/18/22  2:43 AM  Result Value Ref Range   Sodium 140 135 - 145 mmol/L   Potassium 4.8 3.5 - 5.1 mmol/L   Chloride 105 98 - 111 mmol/L   BUN 21 8 - 23 mg/dL   Creatinine, Ser 1.30 (H) 0.61 - 1.24 mg/dL   Glucose, Bld 163 (H) 70 - 99 mg/dL    Comment: Glucose reference range applies only to samples taken after fasting for at least 8 hours.   Calcium, Ion  1.09 (L) 1.15 - 1.40 mmol/L   TCO2 26 22 - 32 mmol/L   Hemoglobin 9.2 (L) 13.0 - 17.0 g/dL   HCT 27.0 (L) 39.0 - 52.0 %  I-STAT 7, (LYTES, BLD GAS, ICA, H+H)     Status: Abnormal   Collection Time: 09/18/22  2:48 AM  Result Value Ref Range   pH, Arterial 7.377 7.35 - 7.45   pCO2 arterial 38.3 32 - 48 mmHg   pO2, Arterial 87 83 - 108 mmHg   Bicarbonate 22.8 20.0 - 28.0 mmol/L   TCO2 24 22 - 32 mmol/L   O2 Saturation 97 %   Acid-base deficit 2.0 0.0 - 2.0 mmol/L   Sodium 140 135 - 145 mmol/L   Potassium 4.7 3.5 - 5.1 mmol/L   Calcium, Ion 1.10 (L) 1.15 - 1.40 mmol/L   HCT 27.0 (L) 39.0 - 52.0 %   Hemoglobin 9.2 (L) 13.0 - 17.0 g/dL   Patient temperature 35.9 C    Sample type ARTERIAL   Glucose, capillary     Status: Abnormal   Collection Time: 09/18/22  3:10 AM  Result Value Ref Range   Glucose-Capillary 157 (H) 70 - 99 mg/dL    Comment: Glucose reference range applies only to samples taken after fasting for at least 8 hours.  Prepare platelet pheresis     Status: None (Preliminary result)   Collection Time: 09/18/22  4:04 AM  Result Value Ref Range   Unit Number [id number]    Blood Component Type PLTP2 PSORALEN TREATED    Unit division 00    Status of  Unit ISSUED    Transfusion Status      OK TO TRANSFUSE Performed at [facility] [address] [place], [place] [address]   Glucose, capillary     Status: Abnormal   Collection Time: 09/18/22  4:11 AM  Result Value Ref Range   Glucose-Capillary 151 (H) 70 - 99 mg/dL    Comment: Glucose reference range applies only to samples taken after fasting for at least 8 hours.  Glucose, capillary     Status: Abnormal   Collection Time: 09/18/22  5:11 AM  Result Value Ref Range   Glucose-Capillary 141 (H) 70 - 99 mg/dL    Comment: Glucose reference range applies only to samples taken after fasting for at least 8 hours.  Glucose, capillary     Status: Abnormal   Collection Time: 09/18/22  6:09 AM  Result Value Ref Range   Glucose-Capillary 152 (H) 70 - 99 mg/dL    Comment: Glucose reference range applies only to samples taken after fasting for at least 8 hours.  Basic metabolic panel     Status: Abnormal   Collection Time: 09/18/22  6:30 AM  Result Value Ref Range   Sodium 138 135 - 145 mmol/L   Potassium 4.4 3.5 - 5.1 mmol/L   Chloride 106 98 - 111 mmol/L   CO2 23 22 - 32 mmol/L   Glucose, Bld 139 (H) 70 - 99 mg/dL    Comment: Glucose reference range applies only to samples taken after fasting for at least 8 hours.   BUN 23 8 - 23 mg/dL   Creatinine, Ser 1.66 (H) 0.61 - 1.24 mg/dL   Calcium 8.3 (L) 8.9 - 10.3 mg/dL   GFR, Estimated 42 (L) >60 mL/min    Comment: (NOTE) Calculated using the CKD-EPI Creatinine Equation (2021)    Anion gap 9 5 - 15    Comment: Performed at [facility]  [facility], [place] [address] [place], [place] [address]  CBC     Status: Abnormal   Collection Time: 09/18/22  6:30 AM  Result Value Ref Range   WBC 9.4 4.0 - 10.5 K/uL   RBC 2.80 (L) 4.22 - 5.81 MIL/uL   Hemoglobin 7.9 (L) 13.0 - 17.0 g/dL   HCT 24.1 (L) 39.0 - 52.0 %   MCV 86.1 80.0 - 100.0 fL   MCH 28.2 26.0 - 34.0 pg   MCHC 32.8 30.0 - 36.0 g/dL   RDW 14.2 11.5 - 15.5 %   Platelets 59 (L) 150 -  400 K/uL    Comment: Immature Platelet Fraction may be clinically indicated, consider ordering this additional test [id number] REPEATED TO VERIFY    nRBC 0.0 0.0 - 0.2 %    Comment: Performed at [facility], [place] [address], [place],  [address]  Magnesium     Status: Abnormal   Collection Time: 09/18/22  6:30 AM  Result Value Ref Range   Magnesium 2.9 (H) 1.7 - 2.4 mg/dL    Comment: Performed at [facility] [address], [place], [place] [address]  Glucose, capillary     Status: Abnormal   Collection Time: 09/18/22  6:59 AM  Result Value Ref Range   Glucose-Capillary 137 (H) 70 - 99 mg/dL    Comment: Glucose reference range applies only to samples taken after fasting for at least 8 hours.  Glucose, capillary     Status: Abnormal   Collection Time: 09/18/22  8:10 AM  Result Value Ref Range   Glucose-Capillary 162 (H) 70 - 99 mg/dL    Comment: Glucose reference range applies only to samples taken after fasting for at least 8 hours.  Glucose, capillary     Status: Abnormal   Collection Time: 09/18/22  9:08 AM  Result Value Ref Range   Glucose-Capillary 155 (H) 70 - 99 mg/dL    Comment: Glucose reference range applies only to samples taken after fasting for at least 8 hours.  Glucose, capillary     Status: Abnormal   Collection Time: 09/18/22 10:09 AM  Result Value Ref Range   Glucose-Capillary 144 (H) 70 - 99 mg/dL    Comment: Glucose reference range applies only to samples taken after fasting for at least 8 hours.    Recent Results (from the past 240 hour(s))  Surgical PCR screen     Status: Abnormal   Collection Time: 09/18/22  2:16 AM   Specimen: Nasal Mucosa; Nasal Swab  Result Value Ref Range Status   MRSA, PCR NEGATIVE NEGATIVE Final   Staphylococcus aureus POSITIVE (A) NEGATIVE Final    Comment: (NOTE) The Xpert SA Assay (FDA approved for NASAL specimens in patients 63 years of age and older), is one component of a comprehensive surveillance  program. It is not intended to diagnose infection nor to guide or monitor treatment. Performed at [facility], [place] [address] [place],  [address]     Lipid Panel No results for input(s): "CHOL", "TRIG", "HDL", "CHOLHDL", "VLDL", "LDLCALC" in the last 72 hours.  Studies/Results: DG Chest Port 1 View  Result Date: 09/18/2022 CLINICAL DATA:  Status post aortic dissection repair. EXAM: PORTABLE CHEST 1 VIEW COMPARISON:  May 04, 2020 FINDINGS: Multiple overlying radiopaque cardiac lead wires are seen. An endotracheal tube is noted with its distal tip approximately 4.8 cm from the carina. An enteric tube is also seen with its distal end extending below the  level of the diaphragm. There is a right internal jugular Swan-Ganz catheter with its distal tip overlying the midline at the level of the hilum. Multiple sternal wires are present. The heart size and mediastinal contours are within normal limits. Mild atelectasis is seen within the mid left lung and right perihilar region. There is no evidence of a pleural effusion. A thin curvilinear linear opacity is seen along the periphery of the left lung which may represent a pleural line and subsequent small to moderate size pneumothorax. The visualized skeletal structures are unremarkable. IMPRESSION: 1. Postoperative changes with mild atelectasis within the mid left lung and right perihilar region. 2. Additional findings concerning for the presence of a left-sided pneumothorax, as described above. CT correlation is recommended. Electronically Signed   By: Virgina Norfolk M.D.   On: 09/18/2022 03:28   ECHO INTRAOPERATIVE TEE  Result Date: 09/18/2022  *INTRAOPERATIVE TRANSESOPHAGEAL REPORT *  Patient Name:   James Estrada Date of Exam: 09/17/2022 Medical Rec #:  [id number]        Height:       72.0 in Accession #:    [id number]       Weight:       183.0 lb Date of Birth:  1941/05/27       BSA:          2.05 m Patient Age:    81 years          BP:           135/80 mmHg Patient Gender: M                HR:           78 bpm. Exam Location:  Inpatient Transesophogeal exam was perform intraoperatively during surgical procedure. Patient was closely monitored under general anesthesia during the entirety of examination. Indications:     aortic dissection Sonographer:     Johny Chess RDCS Performing Phys: Suella Broad MD Diagnosing Phys: Suella Broad MD Complications: No known complications during this procedure.                 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 _ Right Ventricle: The right ventricle appears unchanged from pre-bypass. _ Aorta: s/p repair with valve sparing homograft. Descending aorta unchanged. _ Left Atrium: The left atrium appears unchanged from pre-bypass. _ Left Atrial Appendage: The left atrial appendage appears unchanged from pre-bypass. _ Aortic Valve: Trivial AI with central jet, no stenosis, normal leaflet motion. _ Mitral Valve: The mitral valve appears unchanged from pre-bypass. _ Tricuspid Valve: The tricuspid valve appears unchanged from pre-bypass. _ Pulmonic Valve: The pulmonic valve appears unchanged from pre-bypass. _ Interatrial Septum: The interatrial septum appears unchanged from pre-bypass. _ Interventricular Septum: The interventricular septum appears unchanged from pre-bypass. _ Pericardium: The pericardium appears unchanged from pre-bypass. PRE-OP FINDINGS  Left Ventricle: The left ventricle has normal systolic function, with an ejection fraction of 60-65%. The cavity size was normal. No evidence of left ventricular regional wall motion abnormalities. There is no left ventricular hypertrophy. Left ventricular diastolic function not evaluated. Right Ventricle: The right ventricle has normal systolic function. The cavity was normal. There is no increase in right ventricular wall thickness. Right  ventricular systolic pressure is normal. Catheter present in the right ventricle. There is no aneurysm seen. Left Atrium: Left atrial size was normal in size.  No left atrial/left atrial appendage thrombus was detected. The left atrial appendage is well visualized and there is no evidence of thrombus present. Left atrial appendage velocity is normal at greater than 40 cm/s. Right Atrium: Right atrial size was normal in size. Catheter present in the right atrium. Interatrial Septum: No atrial level shunt detected by color flow Doppler. There is no evidence of a patent foramen ovale. Pericardium: A moderately sized pericardial effusion is present. There is no evidence of cardiac tamponade. There is a small pleural effusion in the left lateral region. Mitral Valve: The mitral valve is normal in structure. Mitral valve regurgitation is not visualized by color flow Doppler. There is no evidence of mitral valve vegetation. There is no evidence of mitral stenosis. Tricuspid Valve: The tricuspid valve was normal in structure. Tricuspid valve regurgitation was not visualized by color flow Doppler. No evidence of tricuspid stenosis is present. There is no evidence of tricuspid valve vegetation. Aortic Valve: The aortic valve is tricuspid, aortic valve regurgitation is mild by color flow Doppler. The jet is eccentric and posteriorly directed. There is no stenosis of the aortic valve. There is mild aortic annular calcification noted. There is no evidence of aortic valve vegetation. Pulmonic Valve: The pulmonic valve was normal in structure. Pulmonic valve regurgitation is not visualized by color flow Doppler. Aorta: There is evidence of a dissection in the ascending aorta and aortic arch. The dissection can be classified as a Stanford type A (proximal). The dissection entry point is at the ascending aorta. A false lumen can be seen that compresses the true lumen. The dissection does not appear to involve the aortic valve.  Pulmonary Artery: Gordy Councilman catheter present on the right. The pulmonary artery is of normal size. Shunts: There is no evidence of an atrial septal defect. +--------------+-------++ LEFT VENTRICLE        +--------------+-------++ PLAX 2D               +--------------+-------++ LVIDd:        5.25 cm +--------------+-------++ LVIDs:        3.45 cm +--------------+-------++ LV SV:        83 ml   +--------------+-------++ LV SV Index:  40.43   +--------------+-------++                       +--------------+-------++  Suella Broad MD Electronically signed by Suella Broad MD Signature Date/Time: 09/18/2022/1:00:52 AM    Final    CT Angio Chest/Abd/Pel for Dissection W and/or W/WO  Result Date: 09/17/2022 CLINICAL DATA:  Acute aortic syndrome (AAS) suspected EXAM: CT ANGIOGRAPHY CHEST, ABDOMEN AND PELVIS TECHNIQUE: Non-contrast CT of the chest was initially obtained. Multidetector CT imaging through the chest, abdomen and pelvis was performed using the standard protocol during bolus administration of intravenous contrast. Multiplanar reconstructed images and MIPs were obtained and reviewed to evaluate the vascular anatomy. RADIATION DOSE REDUCTION: This exam was performed according to the departmental dose-optimization program which includes automated exposure control, adjustment of the mA and/or kV according to patient size and/or use of iterative reconstruction technique. CONTRAST:  63mL OMNIPAQUE IOHEXOL 350 MG/ML SOLN COMPARISON:  CT a 07/21/2022 FINDINGS: CTA CHEST FINDINGS Cardiovascular: New from prior exam is an acute type A thoracic aortic dissection extending throughout the course of the ascending aorta. There is small foci of extraluminal contrast adjacent to the ascending aorta suggestive of active hemorrhage. Small hemopericardium. The false lumen appears thrombosed. The dissection involves the origin of the brachiocephalic  artery which is severely narrowed. Dissection involves  the origin of the left common carotid artery. The left subclavian artery arises from the true lumen. The true lumen is greater than 50% narrowed involving the descending thoracic aorta. There is no central pulmonary embolus. Coronary artery calcifications are seen. Mediastinum/Nodes: Stranding in the anterior mediastinum related to aortic dissection. There is no obvious bulky adenopathy. The esophagus is decompressed. Lungs/Pleura: Hypoventilatory atelectasis dependently. No pleural fluid. Trachea and central airways are patent. Musculoskeletal: There are no acute or suspicious osseous abnormalities. Review of the MIP images confirms the above findings. CTA ABDOMEN AND PELVIS FINDINGS VASCULAR Aorta: Endovascular repair prior abdominal aortic aneurysm. The stent graft is patent. There is increased size of the excluded aneurysm sac currently 4.5 cm, previously 3.5 cm. There are tiny foci of high density in the aneurysm sac series 7, image 204, that were not definitively seen on prior. The proximal aspect of the stent originates just below the renal arteries. The distal aspect of the stents in the common iliac arteries. There is moderate left retroperitoneal hemorrhage outside the aneurysm sac, but no definite extraluminal active bleeding. This retroperitoneal hemorrhage extends to the anterior aspect of the left psoas muscle as well as medial to the left kidney. Celiac: Arises from the true lumen, no stenosis. Dissection does not involve the celiac vessels. Similar ectasia of the celiac artery at 12 mm. SMA: Arises from the true lumen. No dissection involves the SMA. The distal branches are patent. Renals: The right renal artery arises from the true lumen and is patent. There is narrowing at the origin of the left renal artery with diminished left renal perfusion. Stranding is seen adjacent to the proximal left renal artery which is irregular. IMA: Excluded by the graft, with reconstitution distally. Inflow: The  right iliac limb terminates in the common iliac artery. The diameter of the distal common iliac artery beyond the graft measures 2.3 cm, previously 2.4 cm. 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 Distal arteries including into the right lower extremity are patent. There is no dissection involvement. The left iliac limb terminates in the common iliac artery. The diameter of the distal common iliac artery beyond the graft measures 2.5 cm, previously 2.6 cm. Aneurysm of the internal iliac artery measures 3.2 cm, previously 3.1 cm. The distal arteries are patent without dissection component. There is no evidence of active extravasation involving the iliac arteries as source of left retroperitoneal hemorrhage. Veins: Not well assessed on this arterial phase exam. Review of the MIP images confirms the above findings. NON-VASCULAR Hepatobiliary: Similar low-density liver lesions, incompletely characterized on this arterial phase exam but stable from prior. Unremarkable gallbladder. Pancreas: No ductal dilatation or inflammation. Spleen: Normal in size. Surgical clip is seen adjacent to the spleen. Adrenals/Urinary Tract: No adrenal nodules. Clips adjacent to the left adrenal gland. There is a small amount of hemorrhage medial to the left kidney. Decreased perfusion to the left kidney. No hydronephrosis of either kidney. Unremarkable urinary bladder. Stomach/Bowel: Small hiatal hernia. No bowel obstruction or inflammation. Mild sigmoid diverticulosis without diverticulitis. Lymphatic: No bulky adenopathy. Reproductive: Imminent prostate. Other: There is left retroperitoneal hemorrhage note extends from the level of the left mid kidney to the iliac bifurcation. There is no active extravasation within the area of hemorrhage, although source may be the left iliac system. This hemorrhage abuts the anterior aspect of the left iliopsoas muscle. Probable fat containing bilateral inguinal  hernias. Tiny fat containing umbilical  hernia. Musculoskeletal: Degenerative disc disease in the lumbar spine. Right hip arthroplasty. No acute osseous findings. Review of the MIP images confirms the above findings. IMPRESSION: 1. Acute type A thoracic aortic dissection, thrombosis of the false lumen. There is active extravasation adjacent to the ascending aorta as well as small volume hemopericardium. Origin cardiothoracic surgery consultation is needed. 2. Dissection extends into the brachiocephalic artery causing severe narrowing at the origin. Dissection extends into the left common carotid artery origin. 3. Prior stent graft repair of abdominal aortic aneurysm. The residual aneurysm sac has increased in size, faint internal hyperdensities may represent endoleak. 4. Moderate amount of left retroperitoneal hemorrhage extending from the iliac vasculature to the level of the left kidney. There is no active extravasation within this retroperitoneal hemorrhage. Source is uncertain, but may represent the left iliac landing zone. 5. Bilateral internal artery aneurysms, essentially stable. 6. Decreased perfusion of the left kidney, narrowing at the origin of the left renal artery. Critical Value/emergent preliminary results were discussed by telephone at the time of the exam on 09/17/2022 at 6:09 pm to provider Garnette Gunner , who verbally acknowledged these results. Electronically Signed   By: Keith Rake M.D.   On: 09/17/2022 18:42   CT HEAD CODE STROKE WO CONTRAST  Result Date: 09/17/2022 CLINICAL DATA:  Code stroke. Neuro deficit, acute, stroke suspected. EXAM: CT HEAD WITHOUT CONTRAST TECHNIQUE: Contiguous axial images were obtained from the base of the skull through the vertex without intravenous contrast. RADIATION DOSE REDUCTION: This exam was performed according to the departmental dose-optimization program which includes automated exposure control, adjustment of the mA and/or kV according to patient  size and/or use of iterative reconstruction technique. COMPARISON:  No pertinent prior exams available for comparison. FINDINGS: Brain: Mild generalized cerebral atrophy. Mild patchy and ill-defined hypoattenuation within the cerebral white matter, nonspecific but compatible with chronic small vessel ischemic disease. There is no acute intracranial hemorrhage. No demarcated cortical infarct. No extra-axial fluid collection. No evidence of an intracranial mass. No midline shift. Vascular: No hyperdense vessel. Atherosclerotic calcifications. Skull: No fracture or aggressive osseous lesion. Sinuses/Orbits: No mass or acute finding within the imaged orbits. No significant paranasal sinus disease at the imaged levels. ASPECTS [facility] Stroke Program Early CT Score) - Ganglionic level infarction (caudate, lentiform nuclei, internal capsule, insula, M1-M3 cortex): 7 - Supraganglionic infarction (M4-M6 cortex): 3 Total score (0-10 with 10 being normal): 10 No evidence of acute intracranial abnormality. These results were communicated to Dr. Cheral Marker at 5:54 pmon 11/4/2023by text page via the [facility] messaging system. IMPRESSION: No evidence of acute intracranial abnormality. Mild chronic small vessel image changes within the cerebral white matter. Mild generalized cerebral atrophy. Electronically Signed   By: Kellie Simmering D.O.   On: 09/17/2022 17:54    Medications: Scheduled:  sodium chloride   Intravenous Once   acetaminophen  1,000 mg Oral Q6H   Or   acetaminophen (TYLENOL) oral liquid 160 mg/5 mL  1,000 mg Per Tube Q6H   acetaminophen (TYLENOL) oral liquid 160 mg/5 mL  650 mg Per Tube Once   Or   acetaminophen  650 mg Rectal Once   amiodarone  150 mg Intravenous Once   [START ON 09/19/2022] aspirin EC  325 mg Oral Daily   Or   [START ON 09/19/2022] aspirin  324 mg Per Tube Daily   [START ON 09/19/2022] bisacodyl  10 mg Oral Daily   Or   [START ON 09/19/2022] bisacodyl  10 mg Rectal Daily   Chlorhexidine  Gluconate Cloth  6 each Topical Daily   [START ON 09/19/2022] docusate sodium  200 mg Oral Daily   metoprolol tartrate  12.5 mg Oral BID   Or   metoprolol tartrate  12.5 mg Per Tube BID   mupirocin ointment  1 Application Nasal BID   mouth rinse  15 mL Mouth Rinse Q2H   [START ON 09/19/2022] pantoprazole  40 mg Oral Daily   sodium chloride flush  10-40 mL Intracatheter Q12H   [START ON 09/19/2022] sodium chloride flush  3 mL Intravenous Q12H   Continuous:  sodium chloride     [START ON 09/19/2022] sodium chloride     sodium chloride 10 mL/hr at 09/18/22 0700   amiodarone 30 mg/hr (09/18/22 0700)    ceFAZolin (ANCEF) IV Stopped (09/18/22 0456)   dexmedetomidine (PRECEDEX) IV infusion 0.5 mcg/kg/hr (09/18/22 0622)   famotidine (PEPCID) IV Stopped (09/18/22 0424)   insulin 2.8 Units/hr (09/18/22 0700)   lactated ringers     lactated ringers     lactated ringers 20 mL/hr at 09/18/22 0700   milrinone     nitroGLYCERIN     norepinephrine (LEVOPHED) Adult infusion     phenylephrine (NEO-SYNEPHRINE) Adult infusion 10 mcg/min (09/18/22 0700)    Assessment: 81 year old male with a history of AAA s/p repair, presenting with acute onset of severe left worse than RLE weakness in conjunction with left flank/back pain resulting in collapse at home. STAT CT head on presentation was with no acute abnormality. He is now status post hemiarch repair of acute ascending thoracic aortic dissection (type 1 aortic dissection) with Hemashield graft using moderate hypothermic circulatory arrest - Exam postoperatively as documented above. There is no movement of either lower extremity to noxious stimuli or spontaneously. Upper extremities move, R > L but multiple lines and foam pad to LUE may be impeding movement. Not following commands or opening eyes, but does move RUE purposefully when scratching his face.  - Exam findings suggest same potential lesions as noted on yesterday's note when he presented to the ED:  Most likely etiology for his weakness is retroperitoneal hemorrhage as seen on CTA dissection protocol +/- a component of anterior spinal cord infarction due to the aortic dissection. Although bilateral ACA infarctions were initially on the DDx, imaging findings of aortic dissection with retroperitoneal blood as well as flank pain on the same side as the weakest leg make this component of the DDx significantly less likely.     Recommendations: - After he is stabilized, MRI of the thoracic and lumbar spine to evaluate the thoracic spinal cord, conus medullaris and lumbosacral plexii should be obtained if not contraindicated from a surgical standpoint - Neurology will continue to follow.   35 minutes spent in the neurological evaluation and management of this critically ill patient.    LOS: 1 day   @Electronically  signed: Dr. Kerney Elbe 09/18/2022  10:27 AM

## 2022-09-18 NOTE — Anesthesia Postprocedure Evaluation (Signed)
Anesthesia Post Note  Patient: James Estrada  Procedure(s) Performed: REPAIR OF ACUTE ASCENDING THORACIC AORTIC DISSECTION     Patient location during evaluation: SICU Anesthesia Type: General Level of consciousness: sedated Pain management: pain level controlled Vital Signs Assessment: post-procedure vital signs reviewed and stable Respiratory status: patient remains intubated per anesthesia plan Cardiovascular status: stable Postop Assessment: no apparent nausea or vomiting Anesthetic complications: no   No notable events documented.  Last Vitals:  Vitals:   09/18/22 0500 09/18/22 0505  BP: (!) 118/58   Pulse: 80 80  Resp: 12 12  Temp: 36.7 C 36.8 C  SpO2: 98% 98%    Last Pain:  Vitals:   09/18/22 0400  TempSrc: Core  PainSc:                  Effie Berkshire

## 2022-09-18 NOTE — Progress Notes (Signed)
1900 dexmedetomidine currently OFF and bilateral hand mittens present on initial assessment. Pt. agitated/restless. Able to follow commands by squeezing hands but orientation very questionable. Non-purposeful movement of lower extremities.  1955 low dose dexmedetomidine turned on due to agitation/restlessness/hypertension. Pt. attempted to self extubate but unsuccessful. RN applied bilateral soft wrist restraints. Pt. only able to squeeze hands upon command. 2mg  morphine given at this time. Will notify respiratory therapist when pt. is coherent (sustained) for rapid wean.

## 2022-09-18 NOTE — Progress Notes (Signed)
Pt unable to follow commands for NIF/VC at this time. Will attempt to wean to extubate again later

## 2022-09-18 NOTE — Progress Notes (Signed)
1 Day Post-Op Procedure(s) (LRB): REPAIR OF ACUTE ASCENDING THORACIC AORTIC DISSECTION (N/A) Subjective: Starting to wake up, moving all 4, following some simple commands  Objective: Vital signs in last 24 hours: Temp:  [95.5 F (35.3 C)-99.1 F (37.3 C)] 99 F (37.2 C) (11/05 0800) Pulse Rate:  [53-81] 80 (11/05 0800) Cardiac Rhythm: Atrial paced (11/05 0800) Resp:  [12-20] 14 (11/05 0800) BP: (75-153)/(48-80) 118/58 (11/05 0500) SpO2:  [95 %-100 %] 97 % (11/05 0800) Arterial Line BP: (98-145)/(49-74) 143/65 (11/05 0800) FiO2 (%):  [50 %] 50 % (11/05 0700) Weight:  [83 kg] 83 kg (11/04 1843)  Hemodynamic parameters for last 24 hours: PAP: (20-29)/(10-16) 22/13 CVP:  [5 mmHg-8 mmHg] 8 mmHg CO:  [2.8 L/min-4.6 L/min] 4.6 L/min CI:  [1.4 L/min/m2-2.2 L/min/m2] 2.2 L/min/m2  Intake/Output from previous day: 11/04 0701 - 11/05 0700 In: 2710.6 [I.V.:1631.3; Blood:333.3; IV Piggyback:746] Out: 2135 [Urine:1455; Chest Tube:680] Intake/Output this shift: Total I/O In: -  Out: 140 [Urine:100; Chest Tube:40]  General appearance: cooperative Neurologic: see above Heart: regular rate and rhythm Lungs: clear to auscultation bilaterally Extremities: well perfused  Lab Results: Recent Labs    09/18/22 0220 09/18/22 0243 09/18/22 0248 09/18/22 0630  WBC 16.7*  --   --  9.4  HGB 9.9*   < > 9.2* 7.9*  HCT 29.0*   < > 27.0* 24.1*  PLT 69*  67*  --   --  59*   < > = values in this interval not displayed.   BMET:  Recent Labs    09/17/22 1722 09/17/22 1743 09/18/22 0243 09/18/22 0248 09/18/22 0630  NA 139   < > 140 140 138  K 3.5   < > 4.8 4.7 4.4  CL 105   < > 105  --  106  CO2 23  --   --   --  23  GLUCOSE 156*   < > 163*  --  139*  BUN 21   < > 21  --  23  CREATININE 1.30*   < > 1.30*  --  1.66*  CALCIUM 8.7*  --   --   --  8.3*   < > = values in this interval not displayed.    PT/INR:  Recent Labs    09/18/22 0220  LABPROT 13.8  INR 1.1   ABG     Component Value Date/Time   PHART 7.377 09/18/2022 0248   HCO3 22.8 09/18/2022 0248   TCO2 24 09/18/2022 0248   ACIDBASEDEF 2.0 09/18/2022 0248   O2SAT 97 09/18/2022 0248   CBG (last 3)  Recent Labs    09/18/22 0609 09/18/22 0659 09/18/22 0810  GLUCAP 152* 137* 162*    Assessment/Plan: S/P Procedure(s) (LRB): REPAIR OF ACUTE ASCENDING THORACIC AORTIC DISSECTION (N/A) Early  postop from repair of type 1 dissection NEURO- encouraging that he is starting to wake up with no deficit apparent at present   CV- in Sr with good index.  BP well controlled  RESP- will start vent wean  Lateral pneumo on left on CXR- tip of Blake is in left pleural space, monitor  RENAL- creatinine up at 1.66, c/w acute kidney injury  Monitor  ENDO- CBG mildly elevated, continue insulin drip  Gi- no issues at present  Anemia secondary to ABL- Hgb 8 after multiple transfusions  Thrombocytopenia- received multiple units of platelets, no bleeding at present  Coagulopathy- cryo, FFP, factor 7- no bleeding at present   LOS: 1 day    James Estrada  James Estrada 09/18/2022

## 2022-09-18 NOTE — Transfer of Care (Signed)
Immediate Anesthesia Transfer of Care Note  Patient: James Estrada  Procedure(s) Performed: REPAIR OF ACUTE ASCENDING THORACIC AORTIC DISSECTION  Patient Location: ICU  Anesthesia Type:General  Level of Consciousness: sedated and unresponsive  Airway & Oxygen Therapy: Patient remains intubated per anesthesia plan and Patient placed on Ventilator (see vital sign flow sheet for setting)  Post-op Assessment: Report given to RN and Post -op Vital signs reviewed and stable  Post vital signs: Reviewed and stable  Last Vitals:  Vitals Value Taken Time  BP    Temp 35.6 C 09/18/22 0203  Pulse 80 09/18/22 0203  Resp 12 09/18/22 0203  SpO2 98 % 09/18/22 0203  Vitals shown include unvalidated device data.  Last Pain:  Vitals:   09/17/22 1900  TempSrc: Oral  PainSc:          Complications: No notable events documented.

## 2022-09-18 NOTE — Hospital Course (Addendum)
History of Present Illness: 81 yo man with a history of hypertension, hyperlipidemia and AAA s/p stent graft.  Presented after collapse due to sudden bilateral weakness and chest and upper back pain.  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  Majority of the false lumen is thrombosed.  Also evidence of a retroperitoneal bleed.   Needs emergent repair of type 1 dissection.   I discussed the general nature of the procedure, including the need for general anesthesia, the use of cardiopulmonary bypass, the use of hypothermic circulatory arrest, the incisions to be used, and the use of drainage tubes postoperatively with James Estrada and his family. I informed them of the indications, risks, benefits and alternatives.  They understand the risks include, but are not limited to death, stroke, MI, DVT/PE, bleeding, possible need for transfusion, infections, cardiac arrhythmias as well as other organ system dysfunction including respiratory, renal, or GI complications.    He accept the risks and agrees to proceed.     BP was in [id number] systolic on right arm, when moved to left side was 130.   Hospital Course: James Estrada was prepared and taken emergently to the operating room where the type a aortic dissection was repaired utilizing a 30 mm straight Hemashield graft under deep hypothermic circulatory arrest.  He was transfused with multiple blood products perioperatively for expected and severe coagulopathy.  Following the procedure, he was transferred to the surgical ICU in stable condition on milrinone, Neo-Synephrine, and amiodarone.  His cardiac rhythm and hemodynamics remained stable.  He was left on mechanical ventilation until the morning of the first postoperative day.  He was weaned using standard protocols and extubated.  He was able to move all extremities and was following simple commands by the morning of the first postoperative day.  Unfortunately, patient was not able to  follow commands equally on right and left side of his upper extremities.  He was unable to move his lower extremities.  Neurology consult was obtained and followed patient closely.  He developed hypertension and was started on Cardene. He was hypokalemic and supplemented accordingly.  The patient exhibited further reduction in movement of his left side.  Head CT scan was obtained and showed evidence of acute infarcts.  He was transfused additional units of packed cells for blood loss anemia.  He was volume overloaded and started on a lasix drip.  Advanced heart failure team was consulted and monitored patient closely.  The patient became hypotensive and Cardene was discontinued.  He required initiation of Neo-synephrine.  He had some Atrial Fibrillation and was treated with Amiodarone drip.  He converted to NSR.  The patient underwent EEG which did not show evidence of seizures.  The patient continued to show evidence of left hemiparesis.  He was weaned and extubated on 09/22/2022.  He was able to follow simple commands but he did provide verbal responses to questions.  He underwent Cortrak placement for nutrition.  He underwent MRI which confirmed numerous bilateral foci of acute ischemia R>L in MCA and PCA territories as well as cerebellum.  The patient was evaluated by PT/OT who recommended inpatient rehab.  He became more responsive and is following simple commands.  He was hypernatremic and treated with free water.  The patient's CXR showed improvement of previous right pleural effusion and LLL atelectasis.  The patient is more alert.  He underwent SLP evaluation who recommended Modified Barium Swallow.  This showed a mild to moderate aspiration risk.  He was started on a dysphagia 2 diet.   The patient developed leukocytosis and was started on Maxipime for presumed HCAP.  The patient pulled out his feeding tube.  He was closely monitored for adequate oral intake.   The patient was maintaining NSR and was felt  stable for transfer to the progressive care unit on 09/30/2022.

## 2022-09-19 ENCOUNTER — Inpatient Hospital Stay (HOSPITAL_COMMUNITY): Payer: Medicare Other

## 2022-09-19 DIAGNOSIS — Z9889 Other specified postprocedural states: Secondary | ICD-10-CM | POA: Diagnosis not present

## 2022-09-19 LAB — BPAM FFP
Blood Product Expiration Date: 202311062359
Blood Product Expiration Date: 202311062359
ISSUE DATE / TIME: 202311050052
ISSUE DATE / TIME: 202311050052
Unit Type and Rh: 6200
Unit Type and Rh: 6200

## 2022-09-19 LAB — BASIC METABOLIC PANEL
Anion gap: 7 (ref 5–15)
Anion gap: 12 (ref 5–15)
BUN: 27 mg/dL — ABNORMAL HIGH (ref 8–23)
BUN: 29 mg/dL — ABNORMAL HIGH (ref 8–23)
CO2: 22 mmol/L (ref 22–32)
CO2: 23 mmol/L (ref 22–32)
Calcium: 7.7 mg/dL — ABNORMAL LOW (ref 8.9–10.3)
Calcium: 7.8 mg/dL — ABNORMAL LOW (ref 8.9–10.3)
Chloride: 111 mmol/L (ref 98–111)
Chloride: 106 mmol/L (ref 98–111)
Creatinine, Ser: 1.84 mg/dL — ABNORMAL HIGH (ref 0.61–1.24)
Creatinine, Ser: 2.11 mg/dL — ABNORMAL HIGH (ref 0.61–1.24)
GFR, Estimated: 37 mL/min — ABNORMAL LOW (ref >60)
GFR, Estimated: 31 mL/min — ABNORMAL LOW (ref >60)
Glucose, Bld: 118 mg/dL — ABNORMAL HIGH (ref 70–99)
Glucose, Bld: 158 mg/dL — ABNORMAL HIGH (ref 70–99)
Potassium: 3.8 mmol/L (ref 3.5–5.1)
Potassium: 3.4 mmol/L — ABNORMAL LOW (ref 3.5–5.1)
Sodium: 140 mmol/L (ref 135–145)
Sodium: 141 mmol/L (ref 135–145)

## 2022-09-19 LAB — CBC
HCT: 21.1 % — ABNORMAL LOW (ref 39.0–52.0)
HCT: 22.8 % — ABNORMAL LOW (ref 39.0–52.0)
Hemoglobin: 7.1 g/dL — ABNORMAL LOW (ref 13.0–17.0)
Hemoglobin: 7.8 g/dL — ABNORMAL LOW (ref 13.0–17.0)
MCH: 28.5 pg (ref 26.0–34.0)
MCH: 29 pg (ref 26.0–34.0)
MCHC: 33.6 g/dL (ref 30.0–36.0)
MCHC: 34.2 g/dL (ref 30.0–36.0)
MCV: 84.7 fL (ref 80.0–100.0)
MCV: 84.8 fL (ref 80.0–100.0)
Platelets: 57 10*3/uL — ABNORMAL LOW (ref 150–400)
Platelets: 58 10*3/uL — ABNORMAL LOW (ref 150–400)
RBC: 2.49 MIL/uL — ABNORMAL LOW (ref 4.22–5.81)
RBC: 2.69 MIL/uL — ABNORMAL LOW (ref 4.22–5.81)
RDW: 15.1 % (ref 11.5–15.5)
RDW: 14.7 % (ref 11.5–15.5)
WBC: 16 10*3/uL — ABNORMAL HIGH (ref 4.0–10.5)
WBC: 20.1 10*3/uL — ABNORMAL HIGH (ref 4.0–10.5)
nRBC: 0 % (ref 0.0–0.2)
nRBC: 0 % (ref 0.0–0.2)

## 2022-09-19 LAB — GLUCOSE, CAPILLARY
Glucose-Capillary: 118 mg/dL — ABNORMAL HIGH (ref 70–99)
Glucose-Capillary: 124 mg/dL — ABNORMAL HIGH (ref 70–99)
Glucose-Capillary: 128 mg/dL — ABNORMAL HIGH (ref 70–99)
Glucose-Capillary: 145 mg/dL — ABNORMAL HIGH (ref 70–99)
Glucose-Capillary: 147 mg/dL — ABNORMAL HIGH (ref 70–99)
Glucose-Capillary: 148 mg/dL — ABNORMAL HIGH (ref 70–99)
Glucose-Capillary: 152 mg/dL — ABNORMAL HIGH (ref 70–99)
Glucose-Capillary: 152 mg/dL — ABNORMAL HIGH (ref 70–99)
Glucose-Capillary: 155 mg/dL — ABNORMAL HIGH (ref 70–99)
Glucose-Capillary: 157 mg/dL — ABNORMAL HIGH (ref 70–99)
Glucose-Capillary: 160 mg/dL — ABNORMAL HIGH (ref 70–99)
Glucose-Capillary: 161 mg/dL — ABNORMAL HIGH (ref 70–99)
Glucose-Capillary: 163 mg/dL — ABNORMAL HIGH (ref 70–99)

## 2022-09-19 LAB — BPAM PLATELET PHERESIS
Blood Product Expiration Date: 202311052359
Blood Product Expiration Date: 202311062359
Blood Product Expiration Date: 202311062359
ISSUE DATE / TIME: 202311050059
ISSUE DATE / TIME: 202311050059
ISSUE DATE / TIME: 202311050425
Unit Type and Rh: 6200
Unit Type and Rh: 6200
Unit Type and Rh: 6200

## 2022-09-19 LAB — LIPID PANEL
Cholesterol: 82 mg/dL (ref 0–200)
HDL: 22 mg/dL — ABNORMAL LOW (ref >40)
LDL Cholesterol: 42 mg/dL (ref 0–99)
Total CHOL/HDL Ratio: 3.7 RATIO
Triglycerides: 92 mg/dL (ref <150)
VLDL: 18 mg/dL (ref 0–40)

## 2022-09-19 LAB — HEMOGLOBIN A1C
Hgb A1c MFr Bld: 5.5 % (ref 4.8–5.6)
Mean Plasma Glucose: 111.15 mg/dL

## 2022-09-19 LAB — MAGNESIUM
Magnesium: 2.7 mg/dL — ABNORMAL HIGH (ref 1.7–2.4)
Magnesium: 2.5 mg/dL — ABNORMAL HIGH (ref 1.7–2.4)

## 2022-09-19 LAB — BPAM CRYOPRECIPITATE
Blood Product Expiration Date: 202311050642
Blood Product Expiration Date: 202311050642
ISSUE DATE / TIME: 202311050059
ISSUE DATE / TIME: 202311050059
Unit Type and Rh: 6200
Unit Type and Rh: 6200

## 2022-09-19 LAB — PREPARE FRESH FROZEN PLASMA

## 2022-09-19 LAB — PREPARE CRYOPRECIPITATE
Unit division: 0
Unit division: 0

## 2022-09-19 LAB — PREPARE PLATELET PHERESIS
Unit division: 0
Unit division: 0
Unit division: 0

## 2022-09-19 LAB — PREPARE RBC (CROSSMATCH)

## 2022-09-19 MED ORDER — PHENYLEPHRINE HCL-NACL 20-0.9 MG/250ML-% IV SOLN
0.0000 ug/min | INTRAVENOUS | Status: DC
  Administered 2022-09-19: 20 ug/min via INTRAVENOUS
  Filled 2022-09-19: qty 250

## 2022-09-19 MED ORDER — MIDAZOLAM HCL 2 MG/2ML IJ SOLN
INTRAMUSCULAR | Status: AC
  Administered 2022-09-19: 2 mg via INTRAVENOUS
  Filled 2022-09-19: qty 2

## 2022-09-19 MED ORDER — IOHEXOL 350 MG/ML SOLN
75.0000 mL | Freq: Once | INTRAVENOUS | Status: AC | PRN
  Administered 2022-09-19: 75 mL via INTRAVENOUS

## 2022-09-19 MED ORDER — SODIUM CHLORIDE 0.9% IV SOLUTION: Freq: Once | INTRAVENOUS | Status: AC

## 2022-09-19 MED ORDER — FUROSEMIDE 10 MG/ML IJ SOLN
4.0000 mg/h | INTRAVENOUS | Status: DC
  Administered 2022-09-19: 8 mg/h via INTRAVENOUS
  Administered 2022-09-22: 4 mg/h via INTRAVENOUS
  Filled 2022-09-19 (×3): qty 20

## 2022-09-19 MED ORDER — POTASSIUM CHLORIDE 10 MEQ/50ML IV SOLN
10.0000 meq | INTRAVENOUS | Status: AC
  Administered 2022-09-19 (×3): 10 meq via INTRAVENOUS
  Filled 2022-09-19 (×3): qty 50

## 2022-09-19 MED ORDER — FAMOTIDINE 20 MG PO TABS
20.0000 mg | ORAL_TABLET | Freq: Every day | ORAL | Status: DC
  Administered 2022-09-19 – 2022-09-22 (×4): 20 mg
  Filled 2022-09-19 (×4): qty 1

## 2022-09-19 MED ORDER — INSULIN DETEMIR 100 UNIT/ML ~~LOC~~ SOLN
12.0000 [IU] | Freq: Two times a day (BID) | SUBCUTANEOUS | Status: DC
  Administered 2022-09-19 – 2022-09-22 (×6): 12 [IU] via SUBCUTANEOUS
  Filled 2022-09-19 (×9): qty 0.12

## 2022-09-19 MED ORDER — INSULIN ASPART 100 UNIT/ML IJ SOLN
0.0000 [IU] | INTRAMUSCULAR | Status: DC
  Administered 2022-09-19 – 2022-09-20 (×7): 2 [IU] via SUBCUTANEOUS
  Administered 2022-09-21: 3 [IU] via SUBCUTANEOUS
  Administered 2022-09-21 (×2): 2 [IU] via SUBCUTANEOUS
  Administered 2022-09-21: 4 [IU] via SUBCUTANEOUS
  Administered 2022-09-21 – 2022-09-22 (×6): 2 [IU] via SUBCUTANEOUS
  Administered 2022-09-23 (×2): 4 [IU] via SUBCUTANEOUS
  Administered 2022-09-23 (×2): 2 [IU] via SUBCUTANEOUS
  Administered 2022-09-24: 4 [IU] via SUBCUTANEOUS
  Administered 2022-09-24: 2 [IU] via SUBCUTANEOUS
  Administered 2022-09-24 (×3): 4 [IU] via SUBCUTANEOUS
  Administered 2022-09-25 (×5): 2 [IU] via SUBCUTANEOUS
  Administered 2022-09-25 – 2022-09-26 (×3): 4 [IU] via SUBCUTANEOUS
  Administered 2022-09-26: 2 [IU] via SUBCUTANEOUS
  Administered 2022-09-26 (×2): 4 [IU] via SUBCUTANEOUS
  Administered 2022-09-26 – 2022-09-27 (×2): 2 [IU] via SUBCUTANEOUS
  Administered 2022-09-27: 4 [IU] via SUBCUTANEOUS
  Administered 2022-09-27 (×2): 2 [IU] via SUBCUTANEOUS
  Administered 2022-09-27: 4 [IU] via SUBCUTANEOUS
  Administered 2022-09-28: 2 [IU] via SUBCUTANEOUS
  Administered 2022-09-28: 4 [IU] via SUBCUTANEOUS
  Administered 2022-09-28 (×3): 2 [IU] via SUBCUTANEOUS
  Administered 2022-09-28: 4 [IU] via SUBCUTANEOUS
  Administered 2022-09-28: 2 [IU] via SUBCUTANEOUS
  Administered 2022-09-29: 4 [IU] via SUBCUTANEOUS
  Administered 2022-09-29: 2 [IU] via SUBCUTANEOUS
  Administered 2022-09-29 (×2): 4 [IU] via SUBCUTANEOUS
  Administered 2022-09-29 – 2022-09-30 (×2): 2 [IU] via SUBCUTANEOUS

## 2022-09-19 MED ORDER — DOCUSATE SODIUM 50 MG/5ML PO LIQD
200.0000 mg | Freq: Every day | ORAL | Status: DC
  Administered 2022-09-19 – 2022-09-25 (×6): 200 mg
  Filled 2022-09-19 (×6): qty 20

## 2022-09-19 NOTE — OR Nursing (Signed)
The OR charting began prior to daylight savings time, therefore the date and times are reflective as such.

## 2022-09-19 NOTE — Progress Notes (Signed)
Cardiac wean protocol terminated at this time due to pt still unable to open eyes and follow commands for NIF/VC.

## 2022-09-19 NOTE — Consult Note (Addendum)
Advanced Heart Failure Team Consult Note   Primary Physician: [name], MD PCP-Cardiologist:  None  Reason for Consultation: Post-op Ascending AA Dissection Repair   HPI:    James Estrada is seen today, post op ascending AA dissection repair, at the request of Dr. Roxan Hockey.   81 y/o male w/ h/o HTN, HLD, AAA s/p stent graft, he presented on 11/4 w/ sudden onset chest/back pain, b/l leg weakness and collapse. CODE Stroke initially called. Head CT negative. Chest CT showed Type 1 Aortic dissection involving innominate artery. Also evidence of a retroperitoneal bleed. Taken on OR for emergent repair. Intraoperative TEE showed LVEF 60-65%, normal RV and moderate sized pericardial effusion w/o evidence of tamponade. Mediastinal tubes placed. Had massive blood loss requiring multiple units of PRBC's, Plts, FFP, and Cryoprecipitate in OR.   POD# 2. Remains intubated. Grips with right hand but not left. Neurology still following. F/u head CT w/o contrast pending.   Hypertensive this AM, just started on Cardene drip, currently at 5 mg/hr.  WBC 20>>16K. AF. On Ancef. CXR this Am shows left-sided pneumothorax + atelectasis and mild edema.    Volume up. Wt up 20 + lb from pre-op. CVP 11. CT surgery starting lasix gtt this morning, ordered 8/hr.   Scr 1.66>>1.90>>1.84 today K 3.8 Mg 2.7   Hgb 7.1  Plts 68>>57K   Has had some post-op PAF. On amio gtt at 30/hr.   Swan #s CVP 11 PAP 22/11 (18) CO 5.95 CI 2.90    Intraoperative TEE 09/17/22: LVEF 60-65%, RV normal, mod pericardial effusion w/o tamponade  Review of Systems: [y] = yes, [ ]  = no. Unable to obtain current ROS due to patient status, currently intubated   General: Weight gain [ ] ; Weight loss [ ] ; Anorexia [ ] ; Fatigue [ ] ; Fever [ ] ; Chills [ ] ; Weakness [ ]   Cardiac: Chest pain/pressure [ ] ; Resting SOB [ ] ; Exertional SOB [ ] ; Orthopnea [ ] ; Pedal Edema [ ] ; Palpitations [ ] ; Syncope [ ] ; Presyncope [ ] ;  Paroxysmal nocturnal dyspnea[ ]   Pulmonary: Cough [ ] ; Wheezing[ ] ; Hemoptysis[ ] ; Sputum [ ] ; Snoring [ ]   GI: Vomiting[ ] ; Dysphagia[ ] ; Melena[ ] ; Hematochezia [ ] ; Heartburn[ ] ; Abdominal pain [ ] ; Constipation [ ] ; Diarrhea [ ] ; BRBPR [ ]   GU: Hematuria[ ] ; Dysuria [ ] ; Nocturia[ ]   Vascular: Pain in legs with walking [ ] ; Pain in feet with lying flat [ ] ; Non-healing sores [ ] ; Stroke [ ] ; TIA [ ] ; Slurred speech [ ] ;  Neuro: Headaches[ ] ; Vertigo[ ] ; Seizures[ ] ; Paresthesias[ ] ;Blurred vision [ ] ; Diplopia [ ] ; Vision changes [ ]   Ortho/Skin: Arthritis [ ] ; Joint pain [ ] ; Muscle pain [ ] ; Joint swelling [ ] ; Back Pain [ ] ; Rash [ ]   Psych: Depression[ ] ; Anxiety[ ]   Heme: Bleeding problems [ ] ; Clotting disorders [ ] ; Anemia [ ]   Endocrine: Diabetes [ ] ; Thyroid dysfunction[ ]   Home Medications Prior to Admission medications   Not on File    Past Medical History: Past Medical History:  Diagnosis Date   AAA (abdominal aortic aneurysm) (HCC)    last u/s done 07/18/17    Arthritis    Bradycardia    Dysrhythmia    frequent PAC, for 20 years   GERD (gastroesophageal reflux disease)    occ, OTC   Hemorrhoids    History of hiatal hernia    Hyperlipidemia    Hypertension    Seasonal allergies  Past Surgical History: Past Surgical History:  Procedure Laterality Date   ABDOMINAL AORTIC ENDOVASCULAR STENT GRAFT N/A 05/04/2020   Procedure: ABDOMINAL AORTIC ENDOVASCULAR STENT GRAFT;  Surgeon: [name], MD;  Location: [place];  Service: Vascular;  Laterality: N/A;   CATARACT EXTRACTION Bilateral 2009   DIAGNOSTIC LAPAROSCOPY     laparoscopic hernia repair   EYE SURGERY Bilateral    cataract removal   HERNIA REPAIR Bilateral 1999, 2006   JOINT REPLACEMENT Right ~2018   hip replacement   pheochromocytoma  1993   PROSTATECTOMY N/A 05/15/2013   Procedure: PROSTATECTOMY RETROPUBIC; SIMPLE OPEN PROSTATECTOMY;  Surgeon: [name], MD;  Location: [facility];  Service: Urology;   Laterality: N/A;   TOTAL ELBOW REPLACEMENT Left    > 30 years ago   TOTAL HIP ARTHROPLASTY Right 09/08/2017   Procedure: RIGHT TOTAL HIP ARTHROPLASTY ANTERIOR APPROACH;  Surgeon: [name], MD;  Location: [facility];  Service: Orthopedics;  Laterality: Right;   ULTRASOUND GUIDANCE FOR VASCULAR ACCESS Bilateral 05/04/2020   Procedure: ULTRASOUND GUIDANCE FOR VASCULAR ACCESS;  Surgeon: [name], MD;  Location: [facility];  Service: Vascular;  Laterality: Bilateral;    Family History: Family History  Problem Relation Age of Onset   Heart disease Mother    Coronary artery disease Mother    Aneurysm Father    Diabetes Sister     Social History: Social History   Socioeconomic History   Marital status: Married    Spouse name: Not on file   Number of children: Not on file   Years of education: Not on file   Highest education level: Not on file  Occupational History   Not on file  Tobacco Use   Smoking status: Never   Smokeless tobacco: Never  Vaping Use   Vaping Use: Never used  Substance and Sexual Activity   Alcohol use: Yes    Comment: 2 or 3 drinks on weekends   Drug use: No   Sexual activity: Not on file  Other Topics Concern   Not on file  Social History Narrative   Not on file   Social Determinants of Health   Financial Resource Strain: Not on file  Food Insecurity: Not on file  Transportation Needs: Not on file  Physical Activity: Not on file  Stress: Not on file  Social Connections: Not on file    Allergies:  No Known Allergies  Objective:    Vital Signs:   Temp:  [97.9 F (36.6 C)-99 F (37.2 C)] 98.1 F (36.7 C) (11/06 0745) Pulse Rate:  [77-81] 80 (11/06 0745) Resp:  [12-23] 20 (11/06 0745) BP: (82-144)/(44-65) 82/59 (11/06 0300) SpO2:  [91 %-99 %] 93 % (11/06 0745) Arterial Line BP: (112-171)/(45-64) 130/46 (11/06 0745) FiO2 (%):  [40 %] 40 % (11/06 0728) Weight:  [95.5 kg] 95.5 kg (11/06 0500) Last BM Date :  (PTA)  Weight  change: Filed Weights   09/17/22 1843 09/19/22 0500  Weight: 83 kg 95.5 kg    Intake/Output:   Intake/Output Summary (Last 24 hours) at 09/19/2022 0827 Last data filed at 09/19/2022 0700 Gross per 24 hour  Intake 2274.82 ml  Output 2255 ml  Net 19.82 ml      Physical Exam    See MD exam below    Telemetry   A Paced 70s   EKG    SB w/ sinus arrhythmia, 53 bpm   Labs   Basic Metabolic Panel: Recent Labs  Lab 09/17/22 1722 09/17/22  1743 09/18/22 0117 09/18/22 0243 09/18/22 0248 09/18/22 0630 09/18/22 1629 09/18/22 2057 09/19/22 0528  NA 139   < > 138 140 140 138 140 142 140  K 3.5   < > 5.0 4.8 4.7 4.4 3.7 4.0 3.8  CL 105   < > 105 105  --  106  --  109 111  CO2 23  --   --   --   --  23  --  23 22  GLUCOSE 156*   < > 198* 163*  --  139*  --  128* 118*  BUN 21   < > 26* 21  --  23  --  25* 27*  CREATININE 1.30*   < > 1.20 1.30*  --  1.66*  --  1.90* 1.84*  CALCIUM 8.7*  --   --   --   --  8.3*  --  8.0* 7.7*  MG  --   --   --   --   --  2.9*  --  2.8* 2.7*   < > = values in this interval not displayed.    Liver Function Tests: Recent Labs  Lab 09/17/22 1722  AST 20  ALT 13  ALKPHOS 45  BILITOT 0.8  PROT 5.6*  ALBUMIN 3.4*   No results for input(s): "LIPASE", "AMYLASE" in the last 168 hours. No results for input(s): "AMMONIA" in the last 168 hours.  CBC: Recent Labs  Lab 09/17/22 1722 09/17/22 1743 09/17/22 2353 09/18/22 0002 09/18/22 0220 09/18/22 0243 09/18/22 0248 09/18/22 0630 09/18/22 1629 09/18/22 2057 09/19/22 0528  WBC 13.3*  --   --   --  16.7*  --   --  9.4  --  19.6* 16.0*  NEUTROABS 9.5*  --   --   --   --   --   --   --   --   --   --   HGB 13.8   < > 8.9*   < > 9.9*   < > 9.2* 7.9* 7.5* 7.6* 7.1*  HCT 43.0   < > 26.7*   < > 29.0*   < > 27.0* 24.1* 22.0* 22.4* 21.1*  MCV 87.6  --   --   --  85.0  --   --  86.1  --  84.5 84.7  PLT 115*  --  51*  --  69*  67*  --   --  59*  --  68* 57*   < > = values in this interval not  displayed.    Cardiac Enzymes: No results for input(s): "CKTOTAL", "CKMB", "CKMBINDEX", "TROPONINI" in the last 168 hours.  BNP: BNP (last 3 results) No results for input(s): "BNP" in the last 8760 hours.  ProBNP (last 3 results) No results for input(s): "PROBNP" in the last 8760 hours.   CBG: Recent Labs  Lab 09/19/22 0106 09/19/22 0210 09/19/22 0516 09/19/22 0633 09/19/22 0659  GLUCAP 124* 148* 118* 145* 163*    Coagulation Studies: Recent Labs    09/17/22 1722 09/18/22 0220  LABPROT 15.5* 13.8  INR 1.2 1.1     Imaging   DG Chest Port 1 View  Result Date: 09/19/2022 CLINICAL DATA:  Aortic dissection s/p repair. EXAM: PORTABLE CHEST 1 VIEW COMPARISON:  Chest XR, 09/18/2022.  CT chest, 09/17/2022. FINDINGS: Support lines: ETT with catheter tip well-positioned within the midthoracic trachea. RIGHT IJ sheath, with Swan-Ganz within the expected location of the main PA. NG tube within stomach but tip outside  the field of view. Overlying pacer leads. Cardiomediastinal silhouette is enlarged and unchanged. Lungs are hypoinflated with trace basilar opacities. No discrete consolidation. Trace LEFT pleural effusion. No pneumothorax. Median sternotomy changes. No acute osseous abnormality. IMPRESSION: 1. Lines and tubes, well-positioned as above. 2. Mediastinal widening, consistent with known thoracic aortic pathology, now post repair 3. Hypoinflation with minimal basilar atelectasis. Electronically Signed   By: Michaelle Birks M.D.   On: 09/19/2022 07:33     Medications:     Current Medications:  sodium chloride   Intravenous Once   acetaminophen  1,000 mg Oral Q6H   Or   acetaminophen (TYLENOL) oral liquid 160 mg/5 mL  1,000 mg Per Tube Q6H   acetaminophen (TYLENOL) oral liquid 160 mg/5 mL  650 mg Per Tube Once   Or   acetaminophen  650 mg Rectal Once   amiodarone  150 mg Intravenous Once   aspirin EC  325 mg Oral Daily   Or   aspirin  324 mg Per Tube Daily   bisacodyl   10 mg Oral Daily   Or   bisacodyl  10 mg Rectal Daily   Chlorhexidine Gluconate Cloth  6 each Topical Daily   docusate sodium  200 mg Oral Daily   famotidine  20 mg Per Tube Daily   metoprolol tartrate  12.5 mg Oral BID   Or   metoprolol tartrate  12.5 mg Per Tube BID   mupirocin ointment  1 Application Nasal BID   mouth rinse  15 mL Mouth Rinse Q2H   sodium chloride flush  10-40 mL Intracatheter Q12H   sodium chloride flush  3 mL Intravenous Q12H    Infusions:  sodium chloride     sodium chloride     sodium chloride 10 mL/hr at 09/19/22 0700   amiodarone 30 mg/hr (09/19/22 0700)    ceFAZolin (ANCEF) IV Stopped (09/19/22 0610)   dexmedetomidine (PRECEDEX) IV infusion 0.2 mcg/kg/hr (09/19/22 0700)   furosemide (LASIX) 200 mg in dextrose 5 % 100 mL (2 mg/mL) infusion     insulin 1.3 Units/hr (09/19/22 0700)   lactated ringers     lactated ringers     lactated ringers 20 mL/hr at 09/19/22 0700   niCARDipine Stopped (09/18/22 2316)      Patient Profile   81 y/o male w/ h/o HTN, HLD, AAA s/p stent graft admitted w/ Type 1 Aortic Dissection, s/p emergent repair.   Assessment/Plan   1. Type 1 Aortic Dissection  - s/p emergent repair 11/4 - Cardene gtt for BP control - ASA and ? blocker ordered  - management per CT surgery   2. Hypertension  - on Cardene gtt , 5 mg/hr  - parameters per CT surgery and Neuro   3. HFpEF - Intra-op TEE EF 60-65%, RV ok - volume up, post large blood/ fluid resuscitation due to problem #1 + RBP  - CVP 11, Wt up ~20 lb from pre-op. CI ok on swan 2.9 - Start lasix gtt at 8/hr  - follow CVPs and daily wts    4. ABLA/ Thrombocytopenia  - dissection w/ RBP + expected blood loss in OR - required multiple units of PRBC's, Plts, FFP, and Cryoprecipitate in OR - Hgb 7.1, transfusion threshold per CT surgery  - watch Plts, 68>>57K   5. Post-Op PAF - amio gtt 30/hr  - keep K > 4.0 and Mg > 2.0   6. Pericardial Effusion  - mod by Intra-op TEE,  no tamponade - CTs remain in  place - hemodynamically stable  - plan TTE in several days    7. Acute Hypoxic Respiratory Failure - remains intubated  - CXR w/ left PTX + mild edema  - per CT surgery  - needs diuresis to help facilitate vent wean   8. Neuro   - Moves right side more, left less than yesterday  - Neuro following  - Will get head CT without contrast  9. AKI  - B/l Scr ~1.0>>spiked to 1.90  - suspect ATN from hemorrhagic shock/hypotension, now post resuscitation - improving, 1.84 today - nonoliguric, 1.4L in UOP yesterday  - follow w/ diuresis   Length of Stay: 2  Lyda Jester, PA-C  09/19/2022, 8:27 AM  Advanced Heart Failure Team Pager [phone] (M-F; 7a - 5p)  Please contact [facility] for night-coverage after hours (4p -7a ) and weekends on [email or web address]  Agree with above.   81 y/o male as above admitted over the weekend with ascending aortic dissection. Now s/p emergency repair.   Remains on vent. Hemodynamics look good. Has even been hypertensive requiring cardene drip. He has been slow to wake up and exam concerning for left-sided weakness  PAP: (15-29)/(3-18) 20/9 CVP:  [2 mmHg-16 mmHg] 5 mmHg CO:  [4.2 L/min-6.6 L/min] 6.6 L/min CI:  [2.1 L/min/m2-3.2 L/min/m2] 3.2 L/min/m2  General:  On vent responds to noxious stimule  HEENT: normal + ETT.  pinpoint pupils Neck: supple. JVP ok. Carotids 2+ bilat; no bruits. No lymphadenopathy or thryomegaly appreciated. Cor: sternal dressing ok + CTs Regular rate & rhythm. No rubs, gallops or murmurs. Lungs: clear Abdomen: soft, nontender, + distended. No hepatosplenomegaly. No bruits or masses. Hypoactive bowel sounds. Extremities: no cyanosis, clubbing, rash, edema  Neuro: responds to noxious stimuli. Will grip my hand with his R hand when I hold his hand will not do so on left. Left babinski up   Stable from CV standpoint. Hemodynamics look good. EF 60-65% on post-op TEE. Wean cardene as tolerated. Can  likely get swan out.   Main issue now is vent wean and probable R MCA CVA. For head CT today. Neurology involved.   CRITICAL CARE Performed by: Glori Bickers  Total critical care time: 45 minutes  Critical care time was exclusive of separately billable procedures and treating other patients.  Critical care was necessary to treat or prevent imminent or life-threatening deterioration.  Critical care was time spent personally by me (independent of midlevel providers or residents) on the following activities: development of treatment plan with patient and/or surrogate as well as nursing, discussions with consultants, evaluation of patient's response to treatment, examination of patient, obtaining history from patient or surrogate, ordering and performing treatments and interventions, ordering and review of laboratory studies, ordering and review of radiographic studies, pulse oximetry and re-evaluation of patient's condition.  Glori Bickers, MD  12:59 PM

## 2022-09-19 NOTE — Progress Notes (Signed)
Subjective: Patient seen in his room with multiple family members at the bedside.  This morning his head CT showed 3 small right-sided strokes 1 in the cerebellum 1 in the right basal ganglia and 1 in the frontal lobe with a 4 mm area of bleeding.  Patient is still intubated and ventilated without sedation but remains with eyes closed only opens eyes to noxious stimuli.  Left-sided weakness persists.  Objective: Current vital signs: BP (!) 144/48   Pulse 80   Temp 98.6 F (37 C)   Resp 18   Ht 6' (1.829 m)   Wt 95.5 kg   SpO2 93%   BMI 28.55 kg/m  Vital signs in last 24 hours: Temp:  [97.9 F (36.6 C)-99 F (37.2 C)] 98.6 F (37 C) (11/06 1545) Pulse Rate:  [79-81] 80 (11/06 1545) Resp:  [12-25] 18 (11/06 1545) BP: (82-144)/(44-65) 144/48 (11/06 1117) SpO2:  [87 %-100 %] 93 % (11/06 1545) Arterial Line BP: (112-170)/(40-60) 142/44 (11/06 1545) FiO2 (%):  [40 %] 40 % (11/06 1458) Weight:  [95.5 kg] 95.5 kg (11/06 0500)  Intake/Output from previous day: 11/05 0701 - 11/06 0700 In: 2424.8 [I.V.:1628.5; NG/GT:60; IV Piggyback:736.3] Out: 2395 [Urine:1405; Emesis/NG output:200; Chest Tube:790] Intake/Output this shift: Total I/O In: 1231.4 [I.V.:857.4; Blood:274; IV Piggyback:100] Out: [id number] [Urine:1375; Chest Tube:150] Nutritional status:  Diet Order     None      HEENT: [place]/AT Lungs: Intubated, respirations synchronous with ventilator Ext: Multiple lines in place  Neurologic Exam: Ment: Does not follow commands, partially and briefly opens eyes to vigorous tactile stimulation.  Will grimace to noxious stimuli CN: Pupils equal round and reactive, but eyes disconjugate. Corneal reflexes intact, oculocephalic reflex intact, right gaze preference, face appears symmetrical although intubated, cough and gag intact.   Motor/Sensory: Will flicker right upper extremity to noxious, will withdraw the right lower extremity to noxious, no movement of left upper extremity to noxious,  triple flexion of left lower extremity Reflexes: 2+ bilateral brachioradialis Toes upgoing on right, triple flexion on left Cerebellar/Gait: Unable to assess  Lab Results: Results for orders placed or performed during the hospital encounter of 09/17/22 (from the past 48 hour(s))  Protime-INR     Status: Abnormal   Collection Time: 09/17/22  5:22 PM  Result Value Ref Range   Prothrombin Time 15.5 (H) 11.4 - 15.2 seconds   INR 1.2 0.8 - 1.2    Comment: (NOTE) INR goal varies based on device and disease states. Performed at [facility], [place] [address] [place], [place] [address]   APTT     Status: None   Collection Time: 09/17/22  5:22 PM  Result Value Ref Range   aPTT 30 24 - 36 seconds    Comment: Performed at [facility] [address], [place], [place] [address]  CBC     Status: Abnormal   Collection Time: 09/17/22  5:22 PM  Result Value Ref Range   WBC 13.3 (H) 4.0 - 10.5 K/uL   RBC 4.91 4.22 - 5.81 MIL/uL   Hemoglobin 13.8 13.0 - 17.0 g/dL   HCT 43.0 39.0 - 52.0 %   MCV 87.6 80.0 - 100.0 fL   MCH 28.1 26.0 - 34.0 pg   MCHC 32.1 30.0 - 36.0 g/dL   RDW 12.8 11.5 - 15.5 %   Platelets 115 (L) 150 - 400 K/uL    Comment: REPEATED TO VERIFY   nRBC 0.0 0.0 - 0.2 %    Comment: Performed  at [facility], [place] [address] [place], [place] [address]  Differential     Status: Abnormal   Collection Time: 09/17/22  5:22 PM  Result Value Ref Range   Neutrophils Relative % 72 %   Neutro Abs 9.5 (H) 1.7 - 7.7 K/uL   Lymphocytes Relative 17 %   Lymphs Abs 2.3 0.7 - 4.0 K/uL   Monocytes Relative 8 %   Monocytes Absolute 1.1 (H) 0.1 - 1.0 K/uL   Eosinophils Relative 1 %   Eosinophils Absolute 0.2 0.0 - 0.5 K/uL   Basophils Relative 1 %   Basophils Absolute 0.1 0.0 - 0.1 K/uL   Immature Granulocytes 1 %   Abs Immature Granulocytes 0.19 (H) 0.00 - 0.07 K/uL    Comment: Performed at [facility] [address] [place], [place] [address]  Comprehensive  metabolic panel     Status: Abnormal   Collection Time: 09/17/22  5:22 PM  Result Value Ref Range   Sodium 139 135 - 145 mmol/L   Potassium 3.5 3.5 - 5.1 mmol/L   Chloride 105 98 - 111 mmol/L   CO2 23 22 - 32 mmol/L   Glucose, Bld 156 (H) 70 - 99 mg/dL    Comment: Glucose reference range applies only to samples taken after fasting for at least 8 hours.   BUN 21 8 - 23 mg/dL   Creatinine, Ser 1.30 (H) 0.61 - 1.24 mg/dL   Calcium 8.7 (L) 8.9 - 10.3 mg/dL   Total Protein 5.6 (L) 6.5 - 8.1 g/dL   Albumin 3.4 (L) 3.5 - 5.0 g/dL   AST 20 15 - 41 U/L   ALT 13 0 - 44 U/L   Alkaline Phosphatase 45 38 - 126 U/L   Total Bilirubin 0.8 0.3 - 1.2 mg/dL   GFR, Estimated 56 (L) >60 mL/min    Comment: (NOTE) Calculated using the CKD-EPI Creatinine Equation (2021)    Anion gap 11 5 - 15    Comment: Performed at [facility], [place] [address], [place], [place] [address]  Troponin I (High Sensitivity)     Status: None   Collection Time: 09/17/22  5:22 PM  Result Value Ref Range   Troponin I (High Sensitivity) 7 <18 ng/L    Comment: (NOTE) Elevated high sensitivity troponin I (hsTnI) values and significant  changes across serial measurements may suggest ACS but many other  chronic and acute conditions are known to elevate hsTnI results.  Refer to the "Links" section for chest pain algorithms and additional  guidance. Performed at [facility], [place] [address], [place], [place] [address]   Ethanol     Status: None   Collection Time: 09/17/22  5:33 PM  Result Value Ref Range   Alcohol, Ethyl (B) <10 <10 mg/dL    Comment: (NOTE) Lowest detectable limit for serum alcohol is 10 mg/dL.  For medical purposes only. Performed at [facility], [place] [address], [place], [place] [address]   I-stat chem 8, ED     Status: Abnormal   Collection Time: 09/17/22  5:43 PM  Result Value Ref Range   Sodium 139 135 - 145 mmol/L   Potassium 3.6 3.5 - 5.1 mmol/L   Chloride 102 98 - 111 mmol/L    BUN 23 8 - 23 mg/dL   Creatinine, Ser 1.20 0.61 - 1.24 mg/dL   Glucose, Bld 161 (H) 70 - 99 mg/dL    Comment: Glucose reference range applies only  to samples taken after fasting for at least 8 hours.   Calcium, Ion 1.15 1.15 - 1.40 mmol/L   TCO2 22 22 - 32 mmol/L   Hemoglobin 13.9 13.0 - 17.0 g/dL   HCT 41.0 39.0 - 52.0 %  Type and screen Ordered by PROVIDER DEFAULT     Status: None (Preliminary result)   Collection Time: 09/17/22  6:19 PM  Result Value Ref Range   ABO/RH(D) A POS    Antibody Screen NEG    Sample Expiration      09/20/2022,2359 Performed at [facility] [address] [place], [place] [address]    Unit Number [id number]    Blood Component Type RED CELLS,LR    Unit division 00    Status of Unit DISCARDED    Transfusion Status OK TO TRANSFUSE    Crossmatch Result NOT NEEDED    Unit tag comment VERBAL ORDERS PER DR [facility]    Unit Number [id number]    Blood Component Type RED CELLS,LR    Unit division 00    Status of Unit ISSUED,FINAL    Transfusion Status OK TO TRANSFUSE    Crossmatch Result COMPATIBLE    Unit tag comment VERBAL ORDERS PER DR [facility]    Unit Number [id number]    Blood Component Type RED CELLS,LR    Unit division 00    Status of Unit ISSUED,FINAL    Transfusion Status OK TO TRANSFUSE    Crossmatch Result Compatible    Unit Number [id number]    Blood Component Type RED CELLS,LR    Unit division 00    Status of Unit ISSUED,FINAL    Transfusion Status OK TO TRANSFUSE    Crossmatch Result Compatible    Unit Number [id number]    Blood Component Type RED CELLS,LR    Unit division 00    Status of Unit REL FROM [facility]    Transfusion Status OK TO TRANSFUSE    Crossmatch Result Compatible    Unit Number [id number]    Blood Component Type RBC LR PHER2    Unit division 00    Status of Unit REL FROM [facility]    Transfusion Status OK TO TRANSFUSE    Crossmatch Result Compatible    Unit Number [id number]    Blood  Component Type RBC LR PHER2    Unit division 00    Status of Unit REL FROM [facility]    Transfusion Status OK TO TRANSFUSE    Crossmatch Result Compatible    Unit Number [id number]    Blood Component Type RED CELLS,LR    Unit division 00    Status of Unit REL FROM [facility]    Transfusion Status OK TO TRANSFUSE    Crossmatch Result Compatible    Unit Number [id number]    Blood Component Type RED CELLS,LR    Unit division 00    Status of Unit REL FROM [facility]    Transfusion Status OK TO TRANSFUSE    Crossmatch Result Compatible    Unit Number [id number]    Blood Component Type RED CELLS,LR    Unit division 00    Status of Unit REL FROM [facility]    Transfusion Status OK TO TRANSFUSE    Crossmatch Result Compatible    Unit Number [id number]    Blood Component Type RBC LR PHER2    Unit division 00    Status of Unit ISSUED    Transfusion Status OK TO TRANSFUSE    Crossmatch Result Compatible  Unit Number [id number]    Blood Component Type RBC LR PHER1    Unit division 00    Status of Unit ALLOCATED    Transfusion Status OK TO TRANSFUSE    Crossmatch Result Compatible    Unit Number [id number]    Blood Component Type RED CELLS,LR    Unit division 00    Status of Unit ALLOCATED    Transfusion Status OK TO TRANSFUSE    Crossmatch Result Compatible    Unit Number [id number]    Blood Component Type RBC LR PHER1    Unit division 00    Status of Unit ALLOCATED    Transfusion Status OK TO TRANSFUSE    Crossmatch Result Compatible   Prepare RBC (crossmatch)     Status: None   Collection Time: 09/17/22  7:02 PM  Result Value Ref Range   Order Confirmation      ORDER PROCESSED BY BLOOD BANK Performed at [facility], [place] [address], [place], [place] [address]   Prepare fresh frozen plasma     Status: None   Collection Time: 09/17/22  7:02 PM  Result Value Ref Range   Unit Number [id number]    Blood Component Type THAWED PLASMA    Unit division 00     Status of Unit ISSUED,FINAL    Transfusion Status      OK TO TRANSFUSE Performed at [facility] [address] [place], [place] [address]    Unit Number [id number]    Blood Component Type THAWED PLASMA    Unit division 00    Status of Unit ISSUED,FINAL    Transfusion Status OK TO TRANSFUSE   I-stat chem 8, ed     Status: Abnormal   Collection Time: 09/17/22  8:06 PM  Result Value Ref Range   Sodium 138 135 - 145 mmol/L   Potassium 3.7 3.5 - 5.1 mmol/L   Chloride 106 98 - 111 mmol/L   BUN 22 8 - 23 mg/dL   Creatinine, Ser 1.10 0.61 - 1.24 mg/dL   Glucose, Bld 195 (H) 70 - 99 mg/dL    Comment: Glucose reference range applies only to samples taken after fasting for at least 8 hours.   Calcium, Ion 1.09 (L) 1.15 - 1.40 mmol/L   TCO2 21 (L) 22 - 32 mmol/L   Hemoglobin 11.2 (L) 13.0 - 17.0 g/dL   HCT 33.0 (L) 39.0 - 52.0 %  I-STAT, chem 8     Status: Abnormal   Collection Time: 09/17/22  9:13 PM  Result Value Ref Range   Sodium 137 135 - 145 mmol/L   Potassium 4.3 3.5 - 5.1 mmol/L   Chloride 106 98 - 111 mmol/L   BUN 24 (H) 8 - 23 mg/dL   Creatinine, Ser 1.20 0.61 - 1.24 mg/dL   Glucose, Bld 166 (H) 70 - 99 mg/dL    Comment: Glucose reference range applies only to samples taken after fasting for at least 8 hours.   Calcium, Ion 1.13 (L) 1.15 - 1.40 mmol/L   TCO2 22 22 - 32 mmol/L   Hemoglobin 11.2 (L) 13.0 - 17.0 g/dL   HCT 33.0 (L) 39.0 - 52.0 %  I-STAT 7, (LYTES, BLD GAS, ICA, H+H)     Status: Abnormal   Collection Time: 09/17/22  9:22 PM  Result Value Ref Range   pH, Arterial 7.354 7.35 - 7.45   pCO2 arterial 42.3 32 - 48 mmHg   pO2, Arterial 333 (H) 83 -  108 mmHg   Bicarbonate 23.6 20.0 - 28.0 mmol/L   TCO2 25 22 - 32 mmol/L   O2 Saturation 100 %   Acid-base deficit 2.0 0.0 - 2.0 mmol/L   Sodium 138 135 - 145 mmol/L   Potassium 4.5 3.5 - 5.1 mmol/L   Calcium, Ion 0.96 (L) 1.15 - 1.40 mmol/L   HCT 27.0 (L) 39.0 - 52.0 %   Hemoglobin 9.2 (L) 13.0 - 17.0  g/dL   Sample type ARTERIAL   POCT I-Stat EG7     Status: Abnormal   Collection Time: 09/17/22  9:26 PM  Result Value Ref Range   pH, Ven 7.337 7.25 - 7.43   pCO2, Ven 42.1 (L) 44 - 60 mmHg   pO2, Ven 50 (H) 32 - 45 mmHg   Bicarbonate 22.6 20.0 - 28.0 mmol/L   TCO2 24 22 - 32 mmol/L   O2 Saturation 83 %   Acid-base deficit 3.0 (H) 0.0 - 2.0 mmol/L   Sodium 138 135 - 145 mmol/L   Potassium 4.6 3.5 - 5.1 mmol/L   Calcium, Ion 0.95 (L) 1.15 - 1.40 mmol/L   HCT 26.0 (L) 39.0 - 52.0 %   Hemoglobin 8.8 (L) 13.0 - 17.0 g/dL   Sample type VENOUS   I-STAT, chem 8     Status: Abnormal   Collection Time: 09/17/22  9:58 PM  Result Value Ref Range   Sodium 138 135 - 145 mmol/L   Potassium 4.4 3.5 - 5.1 mmol/L   Chloride 105 98 - 111 mmol/L   BUN 26 (H) 8 - 23 mg/dL   Creatinine, Ser 1.20 0.61 - 1.24 mg/dL   Glucose, Bld 158 (H) 70 - 99 mg/dL    Comment: Glucose reference range applies only to samples taken after fasting for at least 8 hours.   Calcium, Ion 0.99 (L) 1.15 - 1.40 mmol/L   TCO2 24 22 - 32 mmol/L   Hemoglobin 9.2 (L) 13.0 - 17.0 g/dL   HCT 27.0 (L) 39.0 - 52.0 %  I-STAT 7, (LYTES, BLD GAS, ICA, H+H)     Status: Abnormal   Collection Time: 09/17/22 11:06 PM  Result Value Ref Range   pH, Arterial 7.287 (L) 7.35 - 7.45   pCO2 arterial 54.3 (H) 32 - 48 mmHg   pO2, Arterial 635 (H) 83 - 108 mmHg   Bicarbonate 25.9 20.0 - 28.0 mmol/L   TCO2 28 22 - 32 mmol/L   O2 Saturation 100 %   Acid-base deficit 1.0 0.0 - 2.0 mmol/L   Sodium 138 135 - 145 mmol/L   Potassium 4.6 3.5 - 5.1 mmol/L   Calcium, Ion 0.94 (L) 1.15 - 1.40 mmol/L   HCT 21.0 (L) 39.0 - 52.0 %   Hemoglobin 7.1 (L) 13.0 - 17.0 g/dL   Sample type ARTERIAL   I-STAT, chem 8     Status: Abnormal   Collection Time: 09/17/22 11:15 PM  Result Value Ref Range   Sodium 146 (H) 135 - 145 mmol/L   Potassium 4.0 3.5 - 5.1 mmol/L   Chloride 101 98 - 111 mmol/L   BUN 26 (H) 8 - 23 mg/dL   Creatinine, Ser 1.10 0.61 - 1.24  mg/dL   Glucose, Bld 174 (H) 70 - 99 mg/dL    Comment: Glucose reference range applies only to samples taken after fasting for at least 8 hours.   Calcium, Ion 0.85 (LL) 1.15 - 1.40 mmol/L   TCO2 33 (H) 22 - 32 mmol/L   Hemoglobin  7.1 (L) 13.0 - 17.0 g/dL   HCT 40.921.0 (L) 81.139.0 - 91.452.0 %  I-STAT 7, (LYTES, BLD GAS, ICA, H+H)     Status: Abnormal   Collection Time: 09/17/22 11:33 PM  Result Value Ref Range   pH, Arterial 7.451 (H) 7.35 - 7.45   pCO2 arterial 31.8 (L) 32 - 48 mmHg   pO2, Arterial 275 (H) 83 - 108 mmHg   Bicarbonate 22.1 20.0 - 28.0 mmol/L   TCO2 23 22 - 32 mmol/L   O2 Saturation 100 %   Acid-base deficit 2.0 0.0 - 2.0 mmol/L   Sodium 139 135 - 145 mmol/L   Potassium 4.7 3.5 - 5.1 mmol/L   Calcium, Ion 0.92 (L) 1.15 - 1.40 mmol/L   HCT 20.0 (L) 39.0 - 52.0 %   Hemoglobin 6.8 (LL) 13.0 - 17.0 g/dL   Sample type ARTERIAL   Hemoglobin and hematocrit, blood     Status: Abnormal   Collection Time: 09/17/22 11:53 PM  Result Value Ref Range   Hemoglobin 8.9 (L) 13.0 - 17.0 g/dL    Comment: REPEATED TO VERIFY   HCT 26.7 (L) 39.0 - 52.0 %    Comment: Performed at [facility], [address] [place], [place] [address]  Platelet count     Status: Abnormal   Collection Time: 09/17/22 11:53 PM  Result Value Ref Range   Platelets 51 (L) 150 - 400 K/uL    Comment: SPECIMEN CHECKED FOR CLOTS Immature Platelet Fraction may be clinically indicated, consider ordering this additional test [id number] REPEATED TO VERIFY RESULT CALLED TO, READ BACK BY AND VERIFIED WITH: CALLED TO MONICA BROOKE @ 0012 JOHN C Performed at [facility], [address] [place], [place] [address]   I-STAT, [place] Pe[place]m 8     Status: Abnormal   Collection Time: 09/18/22 12:02 AM  Result Value Ref Range   Sodium 140 135 - 145 mmol/L   Potassium 4.5 3.5 - 5.1 mmol/L   Chloride 103 98 - 111 mmol/L   BUN 27 (H) 8 - 23 mg/dL   Creatinine, Ser 6.291.10 0.61 - 1.24 mg/dL   Glucose, Bld 528206 (H) 70 - 99  mg/dL    Comment: Glucose reference range applies only to samples taken after fasting for at least 8 hours.   Calcium, Ion 0.84 (LL) 1.15 - 1.40 mmol/L   TCO2 24 22 - 32 mmol/L   Hemoglobin 8.8 (L) 13.0 - 17.0 g/dL   HCT 41.326.0 (L) 24.439.0 - 01.052.0 %  Prepare fresh frozen plasma     Status: None   Collection Time: 09/18/22 12:08 AM  Result Value Ref Range   Unit Number [id number]    Blood Component Type THW PLS APHR    Unit division A0    Status of Unit ISSUED,FINAL    Transfusion Status OK TO TRANSFUSE    Unit Number [id number]    Blood Component Type THW PLS APHR    Unit division B0    Status of Unit ISSUED,FINAL    Transfusion Status      OK TO TRANSFUSE Performed at [facility], [address] [place], [place] [address]   Prepare platelet pheresis     Status: None   Collection Time: 09/18/22 12:14 AM  Result Value Ref Range   Unit Number [id number]    Blood Component Type PLTP2 PSORALEN TREATED    Unit division 00    Status of Unit ISSUED,FINAL    Transfusion Status  OK TO TRANSFUSE Performed at [facility], [place] [address] [place], [place] [address]    Unit Number [id number]    Blood Component Type PLTP2 PSORALEN TREATED    Unit division 00    Status of Unit ISSUED,FINAL    Transfusion Status OK TO TRANSFUSE   I-STAT 7, (LYTES, BLD GAS, ICA, H+H)     Status: Abnormal   Collection Time: 09/18/22 12:29 AM  Result Value Ref Range   pH, Arterial 7.372 7.35 - 7.45   pCO2 arterial 39.9 32 - 48 mmHg   pO2, Arterial 259 (H) 83 - 108 mmHg   Bicarbonate 23.2 20.0 - 28.0 mmol/L   TCO2 24 22 - 32 mmol/L   O2 Saturation 100 %   Acid-base deficit 2.0 0.0 - 2.0 mmol/L   Sodium 138 135 - 145 mmol/L   Potassium 5.1 3.5 - 5.1 mmol/L   Calcium, Ion 0.91 (L) 1.15 - 1.40 mmol/L   HCT 26.0 (L) 39.0 - 52.0 %   Hemoglobin 8.8 (L) 13.0 - 17.0 g/dL   Sample type ARTERIAL   Fibrinogen     Status: Abnormal   Collection Time: 09/18/22 12:38 AM  Result Value Ref  Range   Fibrinogen <60 (LL) 210 - 475 mg/dL    Comment: REPEATED TO VERIFY CRITICAL RESULT CALLED TO, READ BACK BY AND VERIFIED WITH: WENDY CHRISSMAN, CCP  09/18/2022 0138 BTAYLOR Performed at [facility], [address] [place], [place] [address]   Prepare cryoprecipitate     Status: None   Collection Time: 09/18/22 12:41 AM  Result Value Ref Range   Unit Number [id number]    Blood Component Type CRYPOOL THAW    Unit division 00    Status of Unit ISSUED,FINAL    Transfusion Status      OK TO TRANSFUSE Performed at [facility] [address], [place], [place] [address]    Unit Number [id number]    Blood Component Type CRYPOOL THAW    Unit division 00    Status of Unit ISSUED,FINAL    Transfusion Status OK TO TRANSFUSE   I-STAT 7, (LYTES, BLD GAS, ICA, H+H)     Status: Abnormal   Collection Time: 09/18/22  1:12 AM  Result Value Ref Range   pH, Arterial 7.291 (L) 7.35 - 7.45   pCO2 arterial 44.1 32 - 48 mmHg   pO2, Arterial 214 (H) 83 - 108 mmHg   Bicarbonate 21.2 20.0 - 28.0 mmol/L   TCO2 23 22 - 32 mmol/L   O2 Saturation 100 %   Acid-base deficit 5.0 (H) 0.0 - 2.0 mmol/L   Sodium 138 135 - 145 mmol/L   Potassium 5.0 3.5 - 5.1 mmol/L   Calcium, Ion 1.46 (H) 1.15 - 1.40 mmol/L   HCT 28.0 (L) 39.0 - 52.0 %   Hemoglobin 9.5 (L) 13.0 - 17.0 g/dL   Sample type ARTERIAL   I-STAT, chem 8     Status: Abnormal   Collection Time: 09/18/22  1:17 AM  Result Value Ref Range   Sodium 138 135 - 145 mmol/L   Potassium 5.0 3.5 - 5.1 mmol/L   Chloride 105 98 - 111 mmol/L   BUN 26 (H) 8 - 23 mg/dL   Creatinine, Ser 1.20 0.61 - 1.24 mg/dL   Glucose, Bld 198 (H) 70 - 99 mg/dL    Comment: Glucose reference range applies only to samples taken after fasting for at least 8 hours.   Calcium, Ion 1.45 (  H) 1.15 - 1.40 mmol/L   TCO2 23 22 - 32 mmol/L   Hemoglobin 10.2 (L) 13.0 - 17.0 g/dL   HCT 30.0 (L) 39.0 - 52.0 %  Glucose, capillary     Status: Abnormal   Collection  Time: 09/18/22  2:12 AM  Result Value Ref Range   Glucose-Capillary 157 (H) 70 - 99 mg/dL    Comment: Glucose reference range applies only to samples taken after fasting for at least 8 hours.  Surgical PCR screen     Status: Abnormal   Collection Time: 09/18/22  2:16 AM   Specimen: Nasal Mucosa; Nasal Swab  Result Value Ref Range   MRSA, PCR NEGATIVE NEGATIVE   Staphylococcus aureus POSITIVE (A) NEGATIVE    Comment: (NOTE) The Xpert SA Assay (FDA approved for NASAL specimens in patients 50 years of age and older), is one component of a comprehensive surveillance program. It is not intended to diagnose infection nor to guide or monitor treatment. Performed at [facility], [place] [address] [place], [place] [address]   CBC     Status: Abnormal   Collection Time: 09/18/22  2:20 AM  Result Value Ref Range   WBC 16.7 (H) 4.0 - 10.5 K/uL   RBC 3.41 (L) 4.22 - 5.81 MIL/uL   Hemoglobin 9.9 (L) 13.0 - 17.0 g/dL   HCT 29.0 (L) 39.0 - 52.0 %   MCV 85.0 80.0 - 100.0 fL   MCH 29.0 26.0 - 34.0 pg   MCHC 34.1 30.0 - 36.0 g/dL   RDW 14.1 11.5 - 15.5 %   Platelets 67 (L) 150 - 400 K/uL    Comment: Immature Platelet Fraction may be clinically indicated, consider ordering this additional test [id number] REPEATED TO VERIFY    nRBC 0.0 0.0 - 0.2 %    Comment: Performed at [facility], [place] [address], [place], [place] [address]  DIC Panel ONCE - STAT     Status: Abnormal   Collection Time: 09/18/22  2:20 AM  Result Value Ref Range   Prothrombin Time 13.8 11.4 - 15.2 seconds   INR 1.1 0.8 - 1.2    Comment: (NOTE) INR goal varies based on device and disease states.    aPTT 38 (H) 24 - 36 seconds    Comment:        IF BASELINE aPTT IS ELEVATED, SUGGEST PATIENT RISK ASSESSMENT BE USED TO DETERMINE APPROPRIATE ANTICOAGULANT THERAPY.    Fibrinogen 174 (L) 210 - 475 mg/dL    Comment: (NOTE) Fibrinogen results may be underestimated in patients receiving thrombolytic therapy.     D-Dimer, Quant >20.00 (H) 0.00 - 0.50 ug/mL-FEU    Comment: (NOTE) At the manufacturer cut-off value of 0.5 g/mL FEU, this assay has a negative predictive value of 95-100%.This assay is intended for use in conjunction with a clinical pretest probability (PTP) assessment model to exclude pulmonary embolism (PE) and deep venous thrombosis (DVT) in outpatients suspected of PE or DVT. Results should be correlated with clinical presentation.    Platelets 69 (L) 150 - 400 K/uL    Comment: Immature Platelet Fraction may be clinically indicated, consider ordering this additional test [id number] REPEATED TO VERIFY    Smear Review NO SCHISTOCYTES SEEN     Comment: Performed at [facility], [place] [address], [place], [place] [address]  I-STAT, [place] 8     Status: Abnormal   Collection Time: 09/18/22  2:43 AM  Result Value Ref Range   Sodium 140  135 - 145 mmol/L   Potassium 4.8 3.5 - 5.1 mmol/L   Chloride 105 98 - 111 mmol/L   BUN 21 8 - 23 mg/dL   Creatinine, Ser 1.30 (H) 0.61 - 1.24 mg/dL   Glucose, Bld 163 (H) 70 - 99 mg/dL    Comment: Glucose reference range applies only to samples taken after fasting for at least 8 hours.   Calcium, Ion 1.09 (L) 1.15 - 1.40 mmol/L   TCO2 26 22 - 32 mmol/L   Hemoglobin 9.2 (L) 13.0 - 17.0 g/dL   HCT 27.0 (L) 39.0 - 52.0 %  I-STAT 7, (LYTES, BLD GAS, ICA, H+H)     Status: Abnormal   Collection Time: 09/18/22  2:48 AM  Result Value Ref Range   pH, Arterial 7.377 7.35 - 7.45   pCO2 arterial 38.3 32 - 48 mmHg   pO2, Arterial 87 83 - 108 mmHg   Bicarbonate 22.8 20.0 - 28.0 mmol/L   TCO2 24 22 - 32 mmol/L   O2 Saturation 97 %   Acid-base deficit 2.0 0.0 - 2.0 mmol/L   Sodium 140 135 - 145 mmol/L   Potassium 4.7 3.5 - 5.1 mmol/L   Calcium, Ion 1.10 (L) 1.15 - 1.40 mmol/L   HCT 27.0 (L) 39.0 - 52.0 %   Hemoglobin 9.2 (L) 13.0 - 17.0 g/dL   Patient temperature 35.9 C    Sample type ARTERIAL   Glucose, capillary     Status: Abnormal   Collection  Time: 09/18/22  3:10 AM  Result Value Ref Range   Glucose-Capillary 157 (H) 70 - 99 mg/dL    Comment: Glucose reference range applies only to samples taken after fasting for at least 8 hours.  Prepare platelet pheresis     Status: None   Collection Time: 09/18/22  4:04 AM  Result Value Ref Range   Unit Number [id number]    Blood Component Type PLTP2 PSORALEN TREATED    Unit division 00    Status of Unit ISSUED,FINAL    Transfusion Status      OK TO TRANSFUSE Performed at [facility] [address], [place], [place] [address]   Glucose, capillary     Status: Abnormal   Collection Time: 09/18/22  4:11 AM  Result Value Ref Range   Glucose-Capillary 151 (H) 70 - 99 mg/dL    Comment: Glucose reference range applies only to samples taken after fasting for at least 8 hours.  Glucose, capillary     Status: Abnormal   Collection Time: 09/18/22  5:11 AM  Result Value Ref Range   Glucose-Capillary 141 (H) 70 - 99 mg/dL    Comment: Glucose reference range applies only to samples taken after fasting for at least 8 hours.  Glucose, capillary     Status: Abnormal   Collection Time: 09/18/22  6:09 AM  Result Value Ref Range   Glucose-Capillary 152 (H) 70 - 99 mg/dL    Comment: Glucose reference range applies only to samples taken after fasting for at least 8 hours.  Basic metabolic panel     Status: Abnormal   Collection Time: 09/18/22  6:30 AM  Result Value Ref Range   Sodium 138 135 - 145 mmol/L   Potassium 4.4 3.5 - 5.1 mmol/L   Chloride 106 98 - 111 mmol/L   CO2 23 22 - 32 mmol/L   Glucose, Bld 139 (H) 70 - 99 mg/dL    Comment: Glucose reference range applies only to samples taken after  fasting for at least 8 hours.   BUN 23 8 - 23 mg/dL   Creatinine, Ser 1.66 (H) 0.61 - 1.24 mg/dL   Calcium 8.3 (L) 8.9 - 10.3 mg/dL   GFR, Estimated 42 (L) >60 mL/min    Comment: (NOTE) Calculated using the CKD-EPI Creatinine Equation (2021)    Anion gap 9 5 - 15    Comment: Performed at  [facility] [address] [place], [place] [address]  CBC     Status: Abnormal   Collection Time: 09/18/22  6:30 AM  Result Value Ref Range   WBC 9.4 4.0 - 10.5 K/uL   RBC 2.80 (L) 4.22 - 5.81 MIL/uL   Hemoglobin 7.9 (L) 13.0 - 17.0 g/dL   HCT 24.1 (L) 39.0 - 52.0 %   MCV 86.1 80.0 - 100.0 fL   MCH 28.2 26.0 - 34.0 pg   MCHC 32.8 30.0 - 36.0 g/dL   RDW 14.2 11.5 - 15.5 %   Platelets 59 (L) 150 - 400 K/uL    Comment: Immature Platelet Fraction may be clinically indicated, consider ordering this additional test [id number] REPEATED TO VERIFY    nRBC 0.0 0.0 - 0.2 %    Comment: Performed at [facility], [place] [address], [place], [place] [address]  Magnesium     Status: Abnormal   Collection Time: 09/18/22  6:30 AM  Result Value Ref Range   Magnesium 2.9 (H) 1.7 - 2.4 mg/dL    Comment: Performed at [facility] [address] [place], [place] [address]  Glucose, capillary     Status: Abnormal   Collection Time: 09/18/22  6:59 AM  Result Value Ref Range   Glucose-Capillary 137 (H) 70 - 99 mg/dL    Comment: Glucose reference range applies only to samples taken after fasting for at least 8 hours.  Glucose, capillary     Status: Abnormal   Collection Time: 09/18/22  8:10 AM  Result Value Ref Range   Glucose-Capillary 162 (H) 70 - 99 mg/dL    Comment: Glucose reference range applies only to samples taken after fasting for at least 8 hours.  Glucose, capillary     Status: Abnormal   Collection Time: 09/18/22  9:08 AM  Result Value Ref Range   Glucose-Capillary 155 (H) 70 - 99 mg/dL    Comment: Glucose reference range applies only to samples taken after fasting for at least 8 hours.  Glucose, capillary     Status: Abnormal   Collection Time: 09/18/22 10:09 AM  Result Value Ref Range   Glucose-Capillary 144 (H) 70 - 99 mg/dL    Comment: Glucose reference range applies only to samples taken after fasting for at least 8 hours.  Glucose, capillary     Status:  Abnormal   Collection Time: 09/18/22 11:09 AM  Result Value Ref Range   Glucose-Capillary 147 (H) 70 - 99 mg/dL    Comment: Glucose reference range applies only to samples taken after fasting for at least 8 hours.  Provider-confirm verbal Blood Bank order - RBC; 2 Units; Order taken: 09/17/2022; 6:14 PM; Emergency Release, Patient actively bleeding PATIENT ADMITTED FOR AAA. 2 RBC UNITS WERE TAKEN FROM THE ED FRIDGE. 1 UNIT WAS TRANSFUSED AND THE OTHER UNIT WA...     Status: None   Collection Time: 09/18/22 12:36 PM  Result Value Ref Range   Blood product order confirm      MD AUTHORIZATION REQUESTED Performed at [facility], [address] [place], [place] [address]   Glucose, capillary     Status: Abnormal   Collection Time: 09/18/22  1:04 PM  Result Value Ref Range   Glucose-Capillary 129 (H) 70 - 99 mg/dL    Comment: Glucose reference range applies only to samples taken after fasting for at least 8 hours.  Glucose, capillary     Status: Abnormal   Collection Time: 09/18/22  3:38 PM  Result Value Ref Range   Glucose-Capillary 168 (H) 70 - 99 mg/dL    Comment: Glucose reference range applies only to samples taken after fasting for at least 8 hours.  Glucose, capillary     Status: Abnormal   Collection Time: 09/18/22  4:27 PM  Result Value Ref Range   Glucose-Capillary 151 (H) 70 - 99 mg/dL    Comment: Glucose reference range applies only to samples taken after fasting for at least 8 hours.  I-STAT 7, (LYTES, BLD GAS, ICA, H+H)     Status: Abnormal   Collection Time: 09/18/22  4:29 PM  Result Value Ref Range   pH, Arterial 7.393 7.35 - 7.45   pCO2 arterial 39.5 32 - 48 mmHg   pO2, Arterial 96 83 - 108 mmHg   Bicarbonate 24.0 20.0 - 28.0 mmol/L   TCO2 25 22 - 32 mmol/L   O2 Saturation 97 %   Acid-base deficit 1.0 0.0 - 2.0 mmol/L   Sodium 140 135 - 145 mmol/L   Potassium 3.7 3.5 - 5.1 mmol/L   Calcium, Ion 1.17 1.15 - 1.40 mmol/L   HCT 22.0 (L) 39.0 - 52.0 %    Hemoglobin 7.5 (L) 13.0 - 17.0 g/dL   Patient temperature 37.1 C    Sample type ARTERIAL   Glucose, capillary     Status: Abnormal   Collection Time: 09/18/22  5:45 PM  Result Value Ref Range   Glucose-Capillary 134 (H) 70 - 99 mg/dL    Comment: Glucose reference range applies only to samples taken after fasting for at least 8 hours.  Glucose, capillary     Status: Abnormal   Collection Time: 09/18/22  6:13 PM  Result Value Ref Range   Glucose-Capillary 141 (H) 70 - 99 mg/dL    Comment: Glucose reference range applies only to samples taken after fasting for at least 8 hours.  Glucose, capillary     Status: Abnormal   Collection Time: 09/18/22  6:59 PM  Result Value Ref Range   Glucose-Capillary 151 (H) 70 - 99 mg/dL    Comment: Glucose reference range applies only to samples taken after fasting for at least 8 hours.  Basic metabolic panel     Status: Abnormal   Collection Time: 09/18/22  8:57 PM  Result Value Ref Range   Sodium 142 135 - 145 mmol/L   Potassium 4.0 3.5 - 5.1 mmol/L   Chloride 109 98 - 111 mmol/L   CO2 23 22 - 32 mmol/L   Glucose, Bld 128 (H) 70 - 99 mg/dL    Comment: Glucose reference range applies only to samples taken after fasting for at least 8 hours.   BUN 25 (H) 8 - 23 mg/dL   Creatinine, Ser 1.90 (H) 0.61 - 1.24 mg/dL   Calcium 8.0 (L) 8.9 - 10.3 mg/dL   GFR, Estimated 36 (L) >60 mL/min    Comment: (NOTE) Calculated using the CKD-EPI Creatinine Equation (2021)    Anion gap 10 5 - 15    Comment: Performed at [facility], [address] [place], [place] [address]  CBC     Status: Abnormal   Collection Time: 09/18/22  8:57 PM  Result Value Ref Range   WBC 19.6 (H) 4.0 - 10.5 K/uL   RBC 2.65 (L) 4.22 - 5.81 MIL/uL   Hemoglobin 7.6 (L) 13.0 - 17.0 g/dL   HCT 22.4 (L) 39.0 - 52.0 %   MCV 84.5 80.0 - 100.0 fL   MCH 28.7 26.0 - 34.0 pg   MCHC 33.9 30.0 - 36.0 g/dL   RDW 14.9 11.5 - 15.5 %   Platelets 68 (L) 150 - 400 K/uL    Comment: Immature  Platelet Fraction may be clinically indicated, consider ordering this additional test [id number] REPEATED TO VERIFY    nRBC 0.0 0.0 - 0.2 %    Comment: Performed at [facility], [place] [address], [place],  [address]  Magnesium     Status: Abnormal   Collection Time: 09/18/22  8:57 PM  Result Value Ref Range   Magnesium 2.8 (H) 1.7 - 2.4 mg/dL    Comment: Performed at [facility] [address] [place], [place] [address]  Glucose, capillary     Status: Abnormal   Collection Time: 09/18/22  9:05 PM  Result Value Ref Range   Glucose-Capillary 125 (H) 70 - 99 mg/dL    Comment: Glucose reference range applies only to samples taken after fasting for at least 8 hours.  Glucose, capillary     Status: Abnormal   Collection Time: 09/18/22 10:26 PM  Result Value Ref Range   Glucose-Capillary 137 (H) 70 - 99 mg/dL    Comment: Glucose reference range applies only to samples taken after fasting for at least 8 hours.  Glucose, capillary     Status: Abnormal   Collection Time: 09/18/22 11:04 PM  Result Value Ref Range   Glucose-Capillary 151 (H) 70 - 99 mg/dL    Comment: Glucose reference range applies only to samples taken after fasting for at least 8 hours.  Glucose, capillary     Status: Abnormal   Collection Time: 09/19/22 12:13 AM  Result Value Ref Range   Glucose-Capillary 128 (H) 70 - 99 mg/dL    Comment: Glucose reference range applies only to samples taken after fasting for at least 8 hours.  Glucose, capillary     Status: Abnormal   Collection Time: 09/19/22  1:06 AM  Result Value Ref Range   Glucose-Capillary 124 (H) 70 - 99 mg/dL    Comment: Glucose reference range applies only to samples taken after fasting for at least 8 hours.  Glucose, capillary     Status: Abnormal   Collection Time: 09/19/22  2:10 AM  Result Value Ref Range   Glucose-Capillary 148 (H) 70 - 99 mg/dL    Comment: Glucose reference range applies only to samples taken after fasting for at  least 8 hours.  Glucose, capillary     Status: Abnormal   Collection Time: 09/19/22  5:16 AM  Result Value Ref Range   Glucose-Capillary 118 (H) 70 - 99 mg/dL    Comment: Glucose reference range applies only to samples taken after fasting for at least 8 hours.  Basic metabolic panel     Status: Abnormal   Collection Time: 09/19/22  5:28 AM  Result Value Ref Range   Sodium 140 135 - 145 mmol/L   Potassium 3.8 3.5 - 5.1 mmol/L   Chloride 111 98 - 111 mmol/L   CO2 22 22 - 32  mmol/L   Glucose, Bld 118 (H) 70 - 99 mg/dL    Comment: Glucose reference range applies only to samples taken after fasting for at least 8 hours.   BUN 27 (H) 8 - 23 mg/dL   Creatinine, Ser 1.84 (H) 0.61 - 1.24 mg/dL   Calcium 7.7 (L) 8.9 - 10.3 mg/dL   GFR, Estimated 37 (L) >60 mL/min    Comment: (NOTE) Calculated using the CKD-EPI Creatinine Equation (2021)    Anion gap 7 5 - 15    Comment: Performed at [facility] [address] [place], [place] [address]  Magnesium     Status: Abnormal   Collection Time: 09/19/22  5:28 AM  Result Value Ref Range   Magnesium 2.7 (H) 1.7 - 2.4 mg/dL    Comment: Performed at [facility] [address], [place], [place] [address]  CBC     Status: Abnormal   Collection Time: 09/19/22  5:28 AM  Result Value Ref Range   WBC 16.0 (H) 4.0 - 10.5 K/uL   RBC 2.49 (L) 4.22 - 5.81 MIL/uL   Hemoglobin 7.1 (L) 13.0 - 17.0 g/dL   HCT 21.1 (L) 39.0 - 52.0 %   MCV 84.7 80.0 - 100.0 fL   MCH 28.5 26.0 - 34.0 pg   MCHC 33.6 30.0 - 36.0 g/dL   RDW 15.1 11.5 - 15.5 %   Platelets 57 (L) 150 - 400 K/uL    Comment: Immature Platelet Fraction may be clinically indicated, consider ordering this additional test [id number] REPEATED TO VERIFY    nRBC 0.0 0.0 - 0.2 %    Comment: Performed at [facility], [place] [address] [place], [place] [address]  Lipid panel     Status: Abnormal   Collection Time: 09/19/22  5:28 AM  Result Value Ref Range   Cholesterol 82 0 - 200  mg/dL   Triglycerides 92 <150 mg/dL   HDL 22 (L) >40 mg/dL   Total CHOL/HDL Ratio 3.7 RATIO   VLDL 18 0 - 40 mg/dL   LDL Cholesterol 42 0 - 99 mg/dL    Comment:        Total Cholesterol/HDL:CHD Risk Coronary Heart Disease Risk Table                     Men   Women  1/2 Average Risk   3.4   3.3  Average Risk       5.0   4.4  2 X Average Risk   9.6   7.1  3 X Average Risk  23.4   11.0        Use the calculated Patient Ratio above and the CHD Risk Table to determine the patient's CHD Risk.        ATP III CLASSIFICATION (LDL):  <100     mg/dL   Optimal  100-129  mg/dL   Near or Above                    Optimal  130-159  mg/dL   Borderline  160-189  mg/dL   High  >190     mg/dL   Very High Performed at [facility] [address], [place], [place] [address]   Glucose, capillary     Status: Abnormal   Collection Time: 09/19/22  6:33 AM  Result Value Ref Range   Glucose-Capillary 145 (H) 70 - 99 mg/dL    Comment: Glucose reference  range applies only to samples taken after fasting for at least 8 hours.  Glucose, capillary     Status: Abnormal   Collection Time: 09/19/22  6:59 AM  Result Value Ref Range   Glucose-Capillary 163 (H) 70 - 99 mg/dL    Comment: Glucose reference range applies only to samples taken after fasting for at least 8 hours.  Prepare RBC (crossmatch)     Status: None   Collection Time: 09/19/22  7:43 AM  Result Value Ref Range   Order Confirmation      ORDER PROCESSED BY BLOOD BANK BB SAMPLE OR UNITS ALREADY AVAILABLE Performed at [facility], [place] [address], [place], [place] [address]   Glucose, capillary     Status: Abnormal   Collection Time: 09/19/22  8:35 AM  Result Value Ref Range   Glucose-Capillary 160 (H) 70 - 99 mg/dL    Comment: Glucose reference range applies only to samples taken after fasting for at least 8 hours.  Glucose, capillary     Status: Abnormal   Collection Time: 09/19/22 11:01 AM  Result Value Ref Range    Glucose-Capillary 157 (H) 70 - 99 mg/dL    Comment: Glucose reference range applies only to samples taken after fasting for at least 8 hours.  Glucose, capillary     Status: Abnormal   Collection Time: 09/19/22 12:17 PM  Result Value Ref Range   Glucose-Capillary 152 (H) 70 - 99 mg/dL    Comment: Glucose reference range applies only to samples taken after fasting for at least 8 hours.  Glucose, capillary     Status: Abnormal   Collection Time: 09/19/22  1:25 PM  Result Value Ref Range   Glucose-Capillary 147 (H) 70 - 99 mg/dL    Comment: Glucose reference range applies only to samples taken after fasting for at least 8 hours.  CBC     Status: Abnormal   Collection Time: 09/19/22  3:07 PM  Result Value Ref Range   WBC 20.1 (H) 4.0 - 10.5 K/uL   RBC 2.69 (L) 4.22 - 5.81 MIL/uL   Hemoglobin 7.8 (L) 13.0 - 17.0 g/dL   HCT 22.8 (L) 39.0 - 52.0 %   MCV 84.8 80.0 - 100.0 fL   MCH 29.0 26.0 - 34.0 pg   MCHC 34.2 30.0 - 36.0 g/dL   RDW 14.7 11.5 - 15.5 %   Platelets 58 (L) 150 - 400 K/uL    Comment: SPECIMEN CHECKED FOR CLOTS Immature Platelet Fraction may be clinically indicated, consider ordering this additional test [id number] REPEATED TO VERIFY    nRBC 0.0 0.0 - 0.2 %    Comment: Performed at [facility], [place] [address] [place], [place] [address]  Hemoglobin A1c     Status: None   Collection Time: 09/19/22  3:07 PM  Result Value Ref Range   Hgb A1c MFr Bld 5.5 4.8 - 5.6 %    Comment: (NOTE) Pre diabetes:          5.7%-6.4%  Diabetes:              >6.4%  Glycemic control for   <7.0% adults with diabetes    Mean Plasma Glucose 111.15 mg/dL    Comment: Performed at [facility] [address] [place], [place] [address]  Glucose, capillary     Status: Abnormal   Collection Time: 09/19/22  3:10 PM  Result Value Ref Range   Glucose-Capillary 161 (H) 70 - 99 mg/dL  Comment: Glucose reference range applies only to samples taken after fasting for at least 8 hours.     Recent Results (from the past 240 hour(s))  Surgical PCR screen     Status: Abnormal   Collection Time: 09/18/22  2:16 AM   Specimen: Nasal Mucosa; Nasal Swab  Result Value Ref Range Status   MRSA, PCR NEGATIVE NEGATIVE Final   Staphylococcus aureus POSITIVE (A) NEGATIVE Final    Comment: (NOTE) The Xpert SA Assay (FDA approved for NASAL specimens in patients 67 years of age and older), is one component of a comprehensive surveillance program. It is not intended to diagnose infection nor to guide or monitor treatment. Performed at [facility], [place] [address] [place], [place] [address]     Lipid Panel Recent Labs    09/19/22 0528  CHOL 82  TRIG 92  HDL 22*  CHOLHDL 3.7  VLDL 18  LDLCALC 42    Studies/Results: CT HEAD WO CONTRAST (5MM)  Result Date: 09/19/2022 CLINICAL DATA:  Neuro deficit, acute, stroke suspected. EXAM: CT HEAD WITHOUT CONTRAST TECHNIQUE: Contiguous axial images were obtained from the base of the skull through the vertex without intravenous contrast. RADIATION DOSE REDUCTION: This exam was performed according to the departmental dose-optimization program which includes automated exposure control, adjustment of the mA and/or kV according to patient size and/or use of iterative reconstruction technique. COMPARISON:  Noncontrast head CT 09/17/2022. FINDINGS: Brain: Mild generalized cerebral atrophy. New from the prior head CT of 09/17/2022, there are multiple small acute cortical/subcortical infarcts scattered within the right MCA territory, within the right frontal and parietal lobes. Notably, an infarct involves the right precentral gyrus (series 3, image 30). There is a 4 mm focus of acute parenchymal hemorrhage associated with an infarct in the anterior right frontal lobe (series 3, image 25). Adjacent to this, there is a small curvilinear focus of hyperdensity along, also along the anterior right frontal lobe, which may reflect additional small volume  acute hemorrhage or hyperdense thrombus within a distal right MCA vessel (series 6, image 19) (series 3, image 24). Additionally, there is a new acute infarct within the right corona radiata/caudate nucleus, measuring 1.9 x 1.5 x 2.2 cm (for instance as seen on series 3, image 23) (series 5, image 28). Background mild patchy and ill-defined hypoattenuation within the cerebral white matter, nonspecific but compatible with chronic small vessel disease. Tiny infarct within the right cerebellar hemisphere, not definitively present on the prior head CT of 09/17/2022 and possibly acute (series 3, image 7). No extra-axial fluid collection. No evidence of an intracranial mass. No midline shift. Vascular: Possible hyperdense thrombus within a distal right MCA vessel, as described above. Atherosclerotic calcifications. Skull: No fracture or aggressive osseous lesion. Sinuses/Orbits: No orbital mass or acute orbital finding. Mild mucosal thickening scattered within the bilateral ethmoid air cells. Impressions #1 and #2 called by telephone at the time of interpretation on 09/19/2022 at 10:41 am to provider [facility] , who verbally acknowledged these results. IMPRESSION: Interval development of patchy small acute cortical/subcortical infarcts within the right MCA vascular territory. 4 mm focus of hemorrhage associated with an infarct within the anterior right frontal lobe. Adjacent small curvilinear focus of hyperdensity, also along the anterior right frontal lobe, which may reflect additional small-volume acute hemorrhage or hyperdense thrombus within a distal right MCA vessel. New acute infarct within the right corona radiata/basal ganglia, measuring 1.9 x 2.2 cm. Tiny infarct within the right cerebellar hemisphere, not definitively present on the  prior head CT of 09/17/2022 and possibly acute. Background mild chronic small vessel changes within the cerebral white matter matter. Mild generalized cerebral atrophy.  Electronically Signed   By: Kellie Simmering D.O.   On: 09/19/2022 10:44   DG Chest Port 1 View  Result Date: 09/19/2022 CLINICAL DATA:  Aortic dissection s/p repair. EXAM: PORTABLE CHEST 1 VIEW COMPARISON:  Chest XR, 09/18/2022.  CT chest, 09/17/2022. FINDINGS: Support lines: ETT with catheter tip well-positioned within the midthoracic trachea. RIGHT IJ sheath, with Swan-Ganz within the expected location of the main PA. NG tube within stomach but tip outside the field of view. Overlying pacer leads. Cardiomediastinal silhouette is enlarged and unchanged. Lungs are hypoinflated with trace basilar opacities. No discrete consolidation. Trace LEFT pleural effusion. No pneumothorax. Median sternotomy changes. No acute osseous abnormality. IMPRESSION: 1. Lines and tubes, well-positioned as above. 2. Mediastinal widening, consistent with known thoracic aortic pathology, now post repair 3. Hypoinflation with minimal basilar atelectasis. Electronically Signed   By: Michaelle Birks M.D.   On: 09/19/2022 07:33   DG Chest Port 1 View  Result Date: 09/18/2022 CLINICAL DATA:  Status post aortic dissection repair. EXAM: PORTABLE CHEST 1 VIEW COMPARISON:  May 04, 2020 FINDINGS: Multiple overlying radiopaque cardiac lead wires are seen. An endotracheal tube is noted with its distal tip approximately 4.8 cm from the carina. An enteric tube is also seen with its distal end extending below the level of the diaphragm. There is a right internal jugular Swan-Ganz catheter with its distal tip overlying the midline at the level of the hilum. Multiple sternal wires are present. The heart size and mediastinal contours are within normal limits. Mild atelectasis is seen within the mid left lung and right perihilar region. There is no evidence of a pleural effusion. A thin curvilinear linear opacity is seen along the periphery of the left lung which may represent a pleural line and subsequent small to moderate size pneumothorax. The  visualized skeletal structures are unremarkable. IMPRESSION: 1. Postoperative changes with mild atelectasis within the mid left lung and right perihilar region. 2. Additional findings concerning for the presence of a left-sided pneumothorax, as described above. CT correlation is recommended. Electronically Signed   By: Virgina Norfolk M.D.   On: 09/18/2022 03:28   ECHO INTRAOPERATIVE TEE  Result Date: 09/18/2022  *INTRAOPERATIVE TRANSESOPHAGEAL REPORT *  Patient Name:   James Estrada Date of Exam: 09/17/2022 Medical Rec #:  [id number]        Height:       72.0 in Accession #:    [id number]       Weight:       183.0 lb Date of Birth:  27-Mar-1941       BSA:          2.05 m Patient Age:    81 years         BP:           135/80 mmHg Patient Gender: M                HR:           78 bpm. Exam Location:  Inpatient Transesophogeal exam was perform intraoperatively during surgical procedure. Patient was closely monitored under general anesthesia during the entirety of examination. Indications:     aortic dissection Sonographer:     Johny Chess RDCS Performing Phys: Suella Broad MD Diagnosing Phys: Suella Broad MD Complications: No known complications during this procedure.  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 _ Right Ventricle: The right ventricle appears unchanged from pre-bypass. _ Aorta: s/p repair with valve sparing homograft. Descending aorta unchanged. _ Left Atrium: The left atrium appears unchanged from pre-bypass. _ Left Atrial Appendage: The left atrial appendage appears unchanged from pre-bypass. _ Aortic Valve: Trivial AI with central jet, no stenosis, normal leaflet motion. _ Mitral Valve: The mitral valve appears unchanged from pre-bypass. _ Tricuspid Valve: The tricuspid valve appears unchanged from pre-bypass. _ Pulmonic Valve: The pulmonic valve appears unchanged  from pre-bypass. _ Interatrial Septum: The interatrial septum appears unchanged from pre-bypass. _ Interventricular Septum: The interventricular septum appears unchanged from pre-bypass. _ Pericardium: The pericardium appears unchanged from pre-bypass. PRE-OP FINDINGS  Left Ventricle: The left ventricle has normal systolic function, with an ejection fraction of 60-65%. The cavity size was normal. No evidence of left ventricular regional wall motion abnormalities. There is no left ventricular hypertrophy. Left ventricular diastolic function not evaluated. Right Ventricle: The right ventricle has normal systolic function. The cavity was normal. There is no increase in right ventricular wall thickness. Right ventricular systolic pressure is normal. Catheter present in the right ventricle. There is no aneurysm seen. Left Atrium: Left atrial size was normal in size. No left atrial/left atrial appendage thrombus was detected. The left atrial appendage is well visualized and there is no evidence of thrombus present. Left atrial appendage velocity is normal at greater than 40 cm/s. Right Atrium: Right atrial size was normal in size. Catheter present in the right atrium. Interatrial Septum: No atrial level shunt detected by color flow Doppler. There is no evidence of a patent foramen ovale. Pericardium: A moderately sized pericardial effusion is present. There is no evidence of cardiac tamponade. There is a small pleural effusion in the left lateral region. Mitral Valve: The mitral valve is normal in structure. Mitral valve regurgitation is not visualized by color flow Doppler. There is no evidence of mitral valve vegetation. There is no evidence of mitral stenosis. Tricuspid Valve: The tricuspid valve was normal in structure. Tricuspid valve regurgitation was not visualized by color flow Doppler. No evidence of tricuspid stenosis is present. There is no evidence of tricuspid valve vegetation. Aortic Valve: The aortic valve  is tricuspid, aortic valve regurgitation is mild by color flow Doppler. The jet is eccentric and posteriorly directed. There is no stenosis of the aortic valve. There is mild aortic annular calcification noted. There is no evidence of aortic valve vegetation. Pulmonic Valve: The pulmonic valve was normal in structure. Pulmonic valve regurgitation is not visualized by color flow Doppler. Aorta: There is evidence of a dissection in the ascending aorta and aortic arch. The dissection can be classified as a Stanford type A (proximal). The dissection entry point is at the ascending aorta. A false lumen can be seen that compresses the true lumen. The dissection does not appear to involve the aortic valve. Pulmonary Artery: Gordy Councilman catheter present on the right. The pulmonary artery is of normal size. Shunts: There is no evidence of an atrial septal defect. +--------------+-------++ LEFT VENTRICLE        +--------------+-------++ PLAX 2D               +--------------+-------++ LVIDd:        5.25 cm +--------------+-------++ LVIDs:        3.45 cm +--------------+-------++ LV SV:  83 ml   +--------------+-------++ LV SV Index:  40.43   +--------------+-------++                       +--------------+-------++  Suella Broad MD Electronically signed by Suella Broad MD Signature Date/Time: 09/18/2022/1:00:52 AM    Final    CT Angio Chest/Abd/Pel for Dissection W and/or W/WO  Result Date: 09/17/2022 CLINICAL DATA:  Acute aortic syndrome (AAS) suspected EXAM: CT ANGIOGRAPHY CHEST, ABDOMEN AND PELVIS TECHNIQUE: Non-contrast CT of the chest was initially obtained. Multidetector CT imaging through the chest, abdomen and pelvis was performed using the standard protocol during bolus administration of intravenous contrast. Multiplanar reconstructed images and MIPs were obtained and reviewed to evaluate the vascular anatomy. RADIATION DOSE REDUCTION: This exam was performed according to the  departmental dose-optimization program which includes automated exposure control, adjustment of the mA and/or kV according to patient size and/or use of iterative reconstruction technique. CONTRAST:  38mL OMNIPAQUE IOHEXOL 350 MG/ML SOLN COMPARISON:  CT a 07/21/2022 FINDINGS: CTA CHEST FINDINGS Cardiovascular: New from prior exam is an acute type A thoracic aortic dissection extending throughout the course of the ascending aorta. There is small foci of extraluminal contrast adjacent to the ascending aorta suggestive of active hemorrhage. Small hemopericardium. The false lumen appears thrombosed. The dissection involves the origin of the brachiocephalic artery which is severely narrowed. Dissection involves the origin of the left common carotid artery. The left subclavian artery arises from the true lumen. The true lumen is greater than 50% narrowed involving the descending thoracic aorta. There is no central pulmonary embolus. Coronary artery calcifications are seen. Mediastinum/Nodes: Stranding in the anterior mediastinum related to aortic dissection. There is no obvious bulky adenopathy. The esophagus is decompressed. Lungs/Pleura: Hypoventilatory atelectasis dependently. No pleural fluid. Trachea and central airways are patent. Musculoskeletal: There are no acute or suspicious osseous abnormalities. Review of the MIP images confirms the above findings. CTA ABDOMEN AND PELVIS FINDINGS VASCULAR Aorta: Endovascular repair prior abdominal aortic aneurysm. The stent graft is patent. There is increased size of the excluded aneurysm sac currently 4.5 cm, previously 3.5 cm. There are tiny foci of high density in the aneurysm sac series 7, image 204, that were not definitively seen on prior. The proximal aspect of the stent originates just below the renal arteries. The distal aspect of the stents in the common iliac arteries. There is moderate left retroperitoneal hemorrhage outside the aneurysm sac, but no definite  extraluminal active bleeding. This retroperitoneal hemorrhage extends to the anterior aspect of the left psoas muscle as well as medial to the left kidney. Celiac: Arises from the true lumen, no stenosis. Dissection does not involve the celiac vessels. Similar ectasia of the celiac artery at 12 mm. SMA: Arises from the true lumen. No dissection involves the SMA. The distal branches are patent. Renals: The right renal artery arises from the true lumen and is patent. There is narrowing at the origin of the left renal artery with diminished left renal perfusion. Stranding is seen adjacent to the proximal left renal artery which is irregular. IMA: Excluded by the graft, with reconstitution distally. Inflow: The right iliac limb terminates in the common iliac artery. The diameter of the distal common iliac artery beyond the graft measures 2.3 cm, previously 2.4 cm. 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 Distal arteries including into the right lower extremity are patent. There is no dissection involvement. The left iliac limb terminates in the  common iliac artery. The diameter of the distal common iliac artery beyond the graft measures 2.5 cm, previously 2.6 cm. Aneurysm of the internal iliac artery measures 3.2 cm, previously 3.1 cm. The distal arteries are patent without dissection component. There is no evidence of active extravasation involving the iliac arteries as source of left retroperitoneal hemorrhage. Veins: Not well assessed on this arterial phase exam. Review of the MIP images confirms the above findings. NON-VASCULAR Hepatobiliary: Similar low-density liver lesions, incompletely characterized on this arterial phase exam but stable from prior. Unremarkable gallbladder. Pancreas: No ductal dilatation or inflammation. Spleen: Normal in size. Surgical clip is seen adjacent to the spleen. Adrenals/Urinary Tract: No adrenal nodules. Clips adjacent to the left adrenal  gland. There is a small amount of hemorrhage medial to the left kidney. Decreased perfusion to the left kidney. No hydronephrosis of either kidney. Unremarkable urinary bladder. Stomach/Bowel: Small hiatal hernia. No bowel obstruction or inflammation. Mild sigmoid diverticulosis without diverticulitis. Lymphatic: No bulky adenopathy. Reproductive: Imminent prostate. Other: There is left retroperitoneal hemorrhage note extends from the level of the left mid kidney to the iliac bifurcation. There is no active extravasation within the area of hemorrhage, although source may be the left iliac system. This hemorrhage abuts the anterior aspect of the left iliopsoas muscle. Probable fat containing bilateral inguinal hernias. Tiny fat containing umbilical hernia. Musculoskeletal: Degenerative disc disease in the lumbar spine. Right hip arthroplasty. No acute osseous findings. Review of the MIP images confirms the above findings. IMPRESSION: 1. Acute type A thoracic aortic dissection, thrombosis of the false lumen. There is active extravasation adjacent to the ascending aorta as well as small volume hemopericardium. Origin cardiothoracic surgery consultation is needed. 2. Dissection extends into the brachiocephalic artery causing severe narrowing at the origin. Dissection extends into the left common carotid artery origin. 3. Prior stent graft repair of abdominal aortic aneurysm. The residual aneurysm sac has increased in size, faint internal hyperdensities may represent endoleak. 4. Moderate amount of left retroperitoneal hemorrhage extending from the iliac vasculature to the level of the left kidney. There is no active extravasation within this retroperitoneal hemorrhage. Source is uncertain, but may represent the left iliac landing zone. 5. Bilateral internal artery aneurysms, essentially stable. 6. Decreased perfusion of the left kidney, narrowing at the origin of the left renal artery. Critical Value/emergent  preliminary results were discussed by telephone at the time of the exam on 09/17/2022 at 6:09 pm to provider Garnette Gunner , who verbally acknowledged these results. Electronically Signed   By: Keith Rake M.D.   On: 09/17/2022 18:42   CT HEAD CODE STROKE WO CONTRAST  Result Date: 09/17/2022 CLINICAL DATA:  Code stroke. Neuro deficit, acute, stroke suspected. EXAM: CT HEAD WITHOUT CONTRAST TECHNIQUE: Contiguous axial images were obtained from the base of the skull through the vertex without intravenous contrast. RADIATION DOSE REDUCTION: This exam was performed according to the departmental dose-optimization program which includes automated exposure control, adjustment of the mA and/or kV according to patient size and/or use of iterative reconstruction technique. COMPARISON:  No pertinent prior exams available for comparison. FINDINGS: Brain: Mild generalized cerebral atrophy. Mild patchy and ill-defined hypoattenuation within the cerebral white matter, nonspecific but compatible with chronic small vessel ischemic disease. There is no acute intracranial hemorrhage. No demarcated cortical infarct. No extra-axial fluid collection. No evidence of an intracranial mass. No midline shift. Vascular: No hyperdense vessel. Atherosclerotic calcifications. Skull: No fracture or aggressive osseous lesion. Sinuses/Orbits: No mass or acute finding within the imaged orbits.  No significant paranasal sinus disease at the imaged levels. ASPECTS [facility] Stroke Program Early CT Score) - Ganglionic level infarction (caudate, lentiform nuclei, internal capsule, insula, M1-M3 cortex): 7 - Supraganglionic infarction (M4-M6 cortex): 3 Total score (0-10 with 10 being normal): 10 No evidence of acute intracranial abnormality. These results were communicated to Dr. Cheral Marker at 5:54 pmon 11/4/2023by text page via the [facility] messaging system. IMPRESSION: No evidence of acute intracranial abnormality. Mild chronic small vessel image  changes within the cerebral white matter. Mild generalized cerebral atrophy. Electronically Signed   By: Kellie Simmering D.O.   On: 09/17/2022 17:54    Medications: Scheduled:  sodium chloride   Intravenous Once   acetaminophen  1,000 mg Oral Q6H   Or   acetaminophen (TYLENOL) oral liquid 160 mg/5 mL  1,000 mg Per Tube Q6H   acetaminophen (TYLENOL) oral liquid 160 mg/5 mL  650 mg Per Tube Once   Or   acetaminophen  650 mg Rectal Once   amiodarone  150 mg Intravenous Once   aspirin EC  325 mg Oral Daily   Or   aspirin  324 mg Per Tube Daily   bisacodyl  10 mg Oral Daily   Or   bisacodyl  10 mg Rectal Daily   Chlorhexidine Gluconate Cloth  6 each Topical Daily   docusate  200 mg Per Tube Daily   famotidine  20 mg Per Tube Daily   insulin aspart  0-24 Units Subcutaneous Q4H   insulin detemir  12 Units Subcutaneous BID   metoprolol tartrate  12.5 mg Oral BID   Or   metoprolol tartrate  12.5 mg Per Tube BID   mupirocin ointment  1 Application Nasal BID   mouth rinse  15 mL Mouth Rinse Q2H   sodium chloride flush  10-40 mL Intracatheter Q12H   sodium chloride flush  3 mL Intravenous Q12H   Continuous:  sodium chloride     sodium chloride     sodium chloride 10 mL/hr at 09/19/22 1500   amiodarone 30 mg/hr (09/19/22 1500)    ceFAZolin (ANCEF) IV Stopped (09/19/22 1343)   dexmedetomidine (PRECEDEX) IV infusion 0.2 mcg/kg/hr (09/19/22 1553)   furosemide (LASIX) 200 mg in dextrose 5 % 100 mL (2 mg/mL) infusion 8 mg/hr (09/19/22 1500)   insulin 1.9 Units/hr (09/19/22 1500)   lactated ringers     lactated ringers     lactated ringers 20 mL/hr at 09/19/22 1500   niCARDipine 5 mg/hr (09/19/22 1557)    Assessment: 81 year old male with a history of AAA s/p repair, presenting with acute onset of severe left worse than RLE weakness in conjunction with left flank/back pain resulting in collapse at home. STAT CT head on presentation was with no acute abnormality. 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 Etiology of strokes is either embolic in the setting of type A aortic dissection or potential perioperative in the setting of repair. Will still need MRI brain when stable and epicardial pacing wires removed   Recommendations: -CTA head and neck along with repeat dry head CT to evaluate for expansion of  hemorrhage 6 hours after initial CT - After he is stabilized, MRI of the brain to evaluate for additional strokes, along with MRI thoracic and lumbar spine to evaluate the thoracic spinal cord, conus medullaris and lumbosacral plexii should be obtained if not contraindicated from a surgical standpoint -Stroke team to follow - routine EEG to rule out seizures. - hold off on antiplatelet/AC. - HbA1c, Lipid panel. - Keep SBP below [id number] systolic.     LOS: 2 days  [place] , MSN, AGACNP-BC [facility] See Amion for schedule and pager information 09/19/2022 4:08 PM   NEUROHOSPITALIST ADDENDUM Performed a face to face diagnostic evaluation.   I have reviewed the contents of history and physical exam as documented by PA/ARNP/Resident and agree with above documentation.  I have discussed and formulated the above plan as documented. Edits to the note have been made as needed.  Impression/Key exam findings/Plan.  Donnetta Simpers, MD [facility] [id number]   If 7pm to 7am, please call on call as listed on AMION.

## 2022-09-19 NOTE — TOC Initial Note (Signed)
Transition of Care [facility]) - Initial/Assessment Note    Patient Details  Name: James Estrada MRN: [id number] Date of Birth: Oct 13, 1941  [facility]) CM/SW Contact:    Erenest Rasher, RN Phone Number: [phone] 09/19/2022, 2:38 PM  Clinical Narrative:                 HF TOC CM spoke to pt's dtr, Benjamine Mola at bedsie. Pt was independent PTA. Stated wife lives in home. Explained waiting PT/OT recommenedations when pt more stable. Will continue to follow for dc needs.   Expected Discharge Plan: IP Rehab Facility Barriers to Discharge: Continued Medical Work up   Patient Goals and CMS Choice        Expected Discharge Plan and Services Expected Discharge Plan: [place]   Discharge Planning Services: CM Consult   Living arrangements for the past 2 months: Single Family Home                    Prior Living Arrangements/Services Living arrangements for the past 2 months: Single Family Home Lives with:: Spouse Patient language and need for interpreter reviewed:: Yes Do you feel safe going back to the place where you live?: Yes      Need for Family Participation in Patient Care: Yes (Comment) Care giver support system in place?: Yes (comment)   Criminal Activity/Legal Involvement Pertinent to Current Situation/Hospitalization: No - Comment as needed  Activities of Daily Living      Permission Sought/Granted Permission sought to share information with : Case Manager, Family Supports, PCP Permission granted to share information with : Yes, Verbal Permission Granted  Share Information with NAME: Elizabet Privette     Permission granted to share info w Relationship: wife  Permission granted to share info w Contact Information: [phone]  Emotional Assessment           Psych Involvement: No (comment)  Admission diagnosis:  Left leg weakness [R29.898] Dissection of thoracoabdominal aorta ([place]) [I71.03] Retroperitoneal bleeding  [R58] Status post surgery [Z98.890] S/P aortic dissection repair [X09.407] Patient Active Problem List   Diagnosis Date Noted   S/P aortic dissection repair 09/18/2022   Status post surgery 09/17/2022   Aneurysm of left internal iliac artery ([place]) 05/22/2022   Colon cancer screening 05/22/2022   Essential hypertension 05/22/2022   Hemorrhoids 05/22/2022   Inguinal hernia 05/22/2022   Irritable bowel syndrome 05/22/2022   Mixed hyperlipidemia 05/22/2022   Pure hypercholesterolemia 05/22/2022   Thrombocytopenia ([place]) 05/22/2022   Allergic rhinitis 05/22/2022   AAA (abdominal aortic aneurysm) without rupture ([place]) 05/04/2020   Trochanteric bursitis, right hip 08/20/2018   History of right hip replacement 08/20/2018   Status post total replacement of right hip 09/08/2017   Pain of right hip joint 08/07/2017   Unilateral primary osteoarthritis, right hip 08/07/2017   BPH (benign prostatic hypertrophy) with urinary obstruction 05/15/2013   PCP:  [name], MD Pharmacy:   [facility] [place] [address] [place] [address] Phone: [phone] Fax: [phone]     Social Determinants of Health (SDOH) Interventions    Readmission Risk Interventions    05/05/2020   11:45 AM  Readmission Risk Prevention Plan  Post Dischage Appt Complete  Medication Screening Complete  Transportation Screening Complete

## 2022-09-19 NOTE — Progress Notes (Signed)
EEG complete - results pending 

## 2022-09-19 NOTE — Progress Notes (Signed)
Pt transported from 2H01 to CT and back with no complications. RN, RT, and tech at bedside throughout transport, pt maintained svs throughout transport and scan.

## 2022-09-19 NOTE — Progress Notes (Signed)
Patient ID: James Estrada, male   DOB: Sep 08, 1941, 81 y.o.   MRN: [id number]  TCTS Evening Rounds:  BP on the low side. He received some versed in [place]. Nicardipine is off. BP 101/33, MAP 51 by arterial line. Neo started for BP support to keep MAP>65.  Diuresing well on lasix drip 8.  CVP was 6 this afternoon before swan removed.   Creat up to 2.1 this evening with K+ 3.4.  Hgb minimally changed at 7.8 after transfusion this am. With low CVP and hypotension will transfuse 1 unit PRBC's this pm.  Thrombocytopenia at 58K stable. Hold ASA for now. No Lovenox.   CT head and CTA head and neck:  Narrative & Impression  CLINICAL DATA:  Neuro deficit, acute, stroke suspected.   EXAM: CT HEAD WITHOUT CONTRAST   TECHNIQUE: Contiguous axial images were obtained from the base of the skull through the vertex without intravenous contrast.   RADIATION DOSE REDUCTION: This exam was performed according to the departmental dose-optimization program which includes automated exposure control, adjustment of the mA and/or kV according to patient size and/or use of iterative reconstruction technique.   COMPARISON:  Noncontrast head CT 09/17/2022.   FINDINGS: Brain:   Mild generalized cerebral atrophy.   New from the prior head CT of 09/17/2022, there are multiple small acute cortical/subcortical infarcts scattered within the right MCA territory, within the right frontal and parietal lobes. Notably, an infarct involves the right precentral gyrus (series 3, image 30). There is a 4 mm focus of acute parenchymal hemorrhage associated with an infarct in the anterior right frontal lobe (series 3, image 25). Adjacent to this, there is a small curvilinear focus of hyperdensity along, also along the anterior right frontal lobe, which may reflect additional small volume acute hemorrhage or hyperdense thrombus within a distal right MCA vessel (series 6, image 19) (series 3, image 24).  Additionally, there is a new acute infarct within the right corona radiata/caudate nucleus, measuring 1.9 x 1.5 x 2.2 cm (for instance as seen on series 3, image 23) (series 5, image 28).   Background mild patchy and ill-defined hypoattenuation within the cerebral white matter, nonspecific but compatible with chronic small vessel disease.   Tiny infarct within the right cerebellar hemisphere, not definitively present on the prior head CT of 09/17/2022 and possibly acute (series 3, image 7).   No extra-axial fluid collection.   No evidence of an intracranial mass.   No midline shift.   Vascular: Possible hyperdense thrombus within a distal right MCA vessel, as described above. Atherosclerotic calcifications.   Skull: No fracture or aggressive osseous lesion.   Sinuses/Orbits: No orbital mass or acute orbital finding. Mild mucosal thickening scattered within the bilateral ethmoid air cells.   Impressions #1 and #2 called by telephone at the time of interpretation on 09/19/2022 at 10:41 am to provider [facility] , who verbally acknowledged these results.   IMPRESSION: Interval development of patchy small acute cortical/subcortical infarcts within the right MCA vascular territory. 4 mm focus of hemorrhage associated with an infarct within the anterior right frontal lobe. Adjacent small curvilinear focus of hyperdensity, also along the anterior right frontal lobe, which may reflect additional small-volume acute hemorrhage or hyperdense thrombus within a distal right MCA vessel.   New acute infarct within the right corona radiata/basal ganglia, measuring 1.9 x 2.2 cm.   Tiny infarct within the right cerebellar hemisphere, not definitively present on the prior head CT of 09/17/2022 and possibly acute.  Background mild chronic small vessel changes within the cerebral white matter matter.   Mild generalized cerebral atrophy.     Electronically Signed   By: Kellie Simmering D.O.   On: 09/19/2022 10:44    Remains intubated and sedated.

## 2022-09-19 NOTE — Progress Notes (Signed)
Initial Nutrition Assessment  DOCUMENTATION CODES:   Not applicable  INTERVENTION:   If unable to extubate within 24-48 hours, recommend initiation of TF  Tube Feeding Recommendations via OG/Cortrak:   NUTRITION DIAGNOSIS:   Inadequate oral intake related to acute illness as evidenced by NPO status.  ***  GOAL:   Patient will meet greater than or equal to 90% of their needs  ***  MONITOR:   Labs, Weight trends, Vent status  REASON FOR ASSESSMENT:   Ventilator    ASSESSMENT:    81 yo male admitted with   Family at bedside and reports pt is active at baseline; pt goes to the gym at least 5 days a week, he calls it the "office." Pt has a good appetite and was eating well PTA.   Pt remains on vent support, CT head today, neurology consulted  RN discussed possible Cortrak placement and possible  No recent wt changes; although family reports pt with some intentional wt loss from exercising.   UBW around 175 pounds. Current wt 95.5 kg post op  Labs: Meds:   ***   NUTRITION - FOCUSED PHYSICAL EXAM:  {RD Focused Exam List:21252}  Diet Order:   Diet Order     None       EDUCATION NEEDS:   Not appropriate for education at this time  Skin:     Last BM:     Height:   Ht Readings from Last 1 Encounters:  09/18/22 6' (1.829 m)    Weight:   Wt Readings from Last 1 Encounters:  09/19/22 95.5 kg    Ideal Body Weight:     BMI:  Body mass index is 28.55 kg/m.  Estimated Nutritional Needs:   Kcal:     Protein:     Fluid:       ***

## 2022-09-19 NOTE — Progress Notes (Addendum)
2671 attempt to discontinue soft wrist restraint. Pt. attempted to self extubate again, unable to follow commands at this time. Soft wrist restraints and mittens reapplied. dexmedetomidine maintained at .56mcg/kg/hr.

## 2022-09-19 NOTE — Progress Notes (Signed)
      [address]       [place] [address]             [phone]      Results of CT noted  CT HEAD WITHOUT CONTRAST   TECHNIQUE: Contiguous axial images were obtained from the base of the skull through the vertex without intravenous contrast.   RADIATION DOSE REDUCTION: This exam was performed according to the departmental dose-optimization program which includes automated exposure control, adjustment of the mA and/or kV according to patient size and/or use of iterative reconstruction technique.   COMPARISON:  Noncontrast head CT 09/17/2022.   FINDINGS: Brain:   Mild generalized cerebral atrophy.   New from the prior head CT of 09/17/2022, there are multiple small acute cortical/subcortical infarcts scattered within the right MCA territory, within the right frontal and parietal lobes. Notably, an infarct involves the right precentral gyrus (series 3, image 30). There is a 4 mm focus of acute parenchymal hemorrhage associated with an infarct in the anterior right frontal lobe (series 3, image 25). Adjacent to this, there is a small curvilinear focus of hyperdensity along, also along the anterior right frontal lobe, which may reflect additional small volume acute hemorrhage or hyperdense thrombus within a distal right MCA vessel (series 6, image 19) (series 3, image 24). Additionally, there is a new acute infarct within the right corona radiata/caudate nucleus, measuring 1.9 x 1.5 x 2.2 cm (for instance as seen on series 3, image 23) (series 5, image 28).   Background mild patchy and ill-defined hypoattenuation within the cerebral white matter, nonspecific but compatible with chronic small vessel disease.   Tiny infarct within the right cerebellar hemisphere, not definitively present on the prior head CT of 09/17/2022 and possibly acute (series 3, image 7).   No extra-axial fluid collection.   No evidence of an intracranial mass.   No midline  shift.   Vascular: Possible hyperdense thrombus within a distal right MCA vessel, as described above. Atherosclerotic calcifications.   Skull: No fracture or aggressive osseous lesion.   Sinuses/Orbits: No orbital mass or acute orbital finding. Mild mucosal thickening scattered within the bilateral ethmoid air cells.   Impressions #1 and #2 called by telephone at the time of interpretation on 09/19/2022 at 10:41 am to provider [facility] , who verbally acknowledged these results.   IMPRESSION: Interval development of patchy small acute cortical/subcortical infarcts within the right MCA vascular territory. 4 mm focus of hemorrhage associated with an infarct within the anterior right frontal lobe. Adjacent small curvilinear focus of hyperdensity, also along the anterior right frontal lobe, which may reflect additional small-volume acute hemorrhage or hyperdense thrombus within a distal right MCA vessel.   New acute infarct within the right corona radiata/basal ganglia, measuring 1.9 x 2.2 cm.   Tiny infarct within the right cerebellar hemisphere, not definitively present on the prior head CT of 09/17/2022 and possibly acute.   Background mild chronic small vessel changes within the cerebral white matter matter.   Mild generalized cerebral atrophy.     Electronically Signed   By: Kellie Simmering D.O.   On: 09/19/2022 10:44   Neurology evaluating now Will be getting another CT  Remo Lipps C. Roxan Hockey, MD [facility] [phone]

## 2022-09-19 NOTE — Progress Notes (Signed)
2 Days Post-Op Procedure(s) (LRB): REPAIR OF ACUTE ASCENDING THORACIC AORTIC DISSECTION (N/A) Subjective: Intubated,grips with right hand but not left  Objective: Vital signs in last 24 hours: Temp:  [97.9 F (36.6 C)-99 F (37.2 C)] 98.1 F (36.7 C) (11/06 0730) Pulse Rate:  [77-81] 80 (11/06 0730) Cardiac Rhythm: Atrial paced (11/06 0400) Resp:  [12-23] 16 (11/06 0730) BP: (82-144)/(44-65) 82/59 (11/06 0300) SpO2:  [91 %-99 %] 94 % (11/06 0730) Arterial Line BP: (112-171)/(45-65) 129/46 (11/06 0730) FiO2 (%):  [40 %] 40 % (11/06 0728) Weight:  [95.5 kg] 95.5 kg (11/06 0500)  Hemodynamic parameters for last 24 hours: PAP: (15-29)/(7-18) 25/13 CVP:  [2 mmHg-16 mmHg] 11 mmHg CO:  [4.2 L/min-6.7 L/min] 4.2 L/min CI:  [2.1 L/min/m2-3.2 L/min/m2] 2.1 L/min/m2  Intake/Output from previous day: 11/05 0701 - 11/06 0700 In: 2424.8 [I.V.:1628.5; NG/GT:60; IV Piggyback:736.3] Out: 2395 [Urine:1405; Emesis/NG output:200; Chest Tube:790] Intake/Output this shift: No intake/output data recorded.  General appearance: intubated Neurologic: left sided weakness Heart: regular rate and rhythm Lungs: clear to auscultation bilaterally Extremities: warm with intact pulses  Lab Results: Recent Labs    09/18/22 2057 09/19/22 0528  WBC 19.6* 16.0*  HGB 7.6* 7.1*  HCT 22.4* 21.1*  PLT 68* 57*   BMET:  Recent Labs    09/18/22 0630 09/18/22 1629 09/18/22 2057  NA 138 140 142  K 4.4 3.7 4.0  CL 106  --  109  CO2 23  --  23  GLUCOSE 139*  --  128*  BUN 23  --  25*  CREATININE 1.66*  --  1.90*  CALCIUM 8.3*  --  8.0*    PT/INR:  Recent Labs    09/18/22 0220  LABPROT 13.8  INR 1.1   ABG    Component Value Date/Time   PHART 7.393 09/18/2022 1629   HCO3 24.0 09/18/2022 1629   TCO2 25 09/18/2022 1629   ACIDBASEDEF 1.0 09/18/2022 1629   O2SAT 97 09/18/2022 1629   CBG (last 3)  Recent Labs    09/19/22 0516 09/19/22 0633 09/19/22 0659  GLUCAP 118* 145* 163*     Assessment/Plan: S/P Procedure(s) (LRB): REPAIR OF ACUTE ASCENDING THORACIC AORTIC DISSECTION (N/A) POD # 2 NEURO- remains sedated, was agitated at times last night  Moves right side more, left less than yesterday  Neuro following  Will get head CT without contrast CV- hemodynamics stable  Hypertension- on Cardene drip  In SR on amiodarone RESP- good oxygenation, although CXR looks wet relative to yesterday  ? Left pneumo less apparent today  Vent for now until mental status improves RENAL- creatinine has been rising c/w AKI  BMET this AM pending  Good UP but still markedly + since admit- will start Lasix drip GI- NPO ENDO_ CBG moderately elevated Anemia secondary to ABL- transfuse Thrombocytopenia- PLT remain low- no active bleeding No enoxaparin secondary to PLT, SCD for DVT prophylaxis  LOS: 2 days    James Estrada 09/19/2022

## 2022-09-20 ENCOUNTER — Inpatient Hospital Stay (HOSPITAL_COMMUNITY): Payer: Medicare Other

## 2022-09-20 DIAGNOSIS — R4182 Altered mental status, unspecified: Secondary | ICD-10-CM | POA: Diagnosis not present

## 2022-09-20 DIAGNOSIS — Z9889 Other specified postprocedural states: Secondary | ICD-10-CM | POA: Diagnosis not present

## 2022-09-20 LAB — BASIC METABOLIC PANEL
Anion gap: 11 (ref 5–15)
Anion gap: 12 (ref 5–15)
BUN: 30 mg/dL — ABNORMAL HIGH (ref 8–23)
BUN: 37 mg/dL — ABNORMAL HIGH (ref 8–23)
CO2: 25 mmol/L (ref 22–32)
CO2: 25 mmol/L (ref 22–32)
Calcium: 7.4 mg/dL — ABNORMAL LOW (ref 8.9–10.3)
Calcium: 7.4 mg/dL — ABNORMAL LOW (ref 8.9–10.3)
Chloride: 106 mmol/L (ref 98–111)
Chloride: 105 mmol/L (ref 98–111)
Creatinine, Ser: 2.15 mg/dL — ABNORMAL HIGH (ref 0.61–1.24)
Creatinine, Ser: 2.23 mg/dL — ABNORMAL HIGH (ref 0.61–1.24)
GFR, Estimated: 31 mL/min — ABNORMAL LOW (ref >60)
GFR, Estimated: 29 mL/min — ABNORMAL LOW (ref >60)
Glucose, Bld: 151 mg/dL — ABNORMAL HIGH (ref 70–99)
Glucose, Bld: 175 mg/dL — ABNORMAL HIGH (ref 70–99)
Potassium: 3.4 mmol/L — ABNORMAL LOW (ref 3.5–5.1)
Potassium: 3.3 mmol/L — ABNORMAL LOW (ref 3.5–5.1)
Sodium: 142 mmol/L (ref 135–145)
Sodium: 142 mmol/L (ref 135–145)

## 2022-09-20 LAB — CBC
HCT: 23.6 % — ABNORMAL LOW (ref 39.0–52.0)
Hemoglobin: 8.2 g/dL — ABNORMAL LOW (ref 13.0–17.0)
MCH: 29.6 pg (ref 26.0–34.0)
MCHC: 34.7 g/dL (ref 30.0–36.0)
MCV: 85.2 fL (ref 80.0–100.0)
Platelets: 51 10*3/uL — ABNORMAL LOW (ref 150–400)
RBC: 2.77 MIL/uL — ABNORMAL LOW (ref 4.22–5.81)
RDW: 15.1 % (ref 11.5–15.5)
WBC: 14.8 10*3/uL — ABNORMAL HIGH (ref 4.0–10.5)
nRBC: 0 % (ref 0.0–0.2)

## 2022-09-20 LAB — POCT I-STAT 7, (LYTES, BLD GAS, ICA,H+H)
Acid-Base Excess: 2 mmol/L (ref 0.0–2.0)
Bicarbonate: 25.2 mmol/L (ref 20.0–28.0)
Calcium, Ion: 1.03 mmol/L — ABNORMAL LOW (ref 1.15–1.40)
HCT: 24 % — ABNORMAL LOW (ref 39.0–52.0)
Hemoglobin: 8.2 g/dL — ABNORMAL LOW (ref 13.0–17.0)
O2 Saturation: 94 %
Potassium: 3.9 mmol/L (ref 3.5–5.1)
Sodium: 141 mmol/L (ref 135–145)
TCO2: 26 mmol/L (ref 22–32)
pCO2 arterial: 34 mmHg (ref 32–48)
pH, Arterial: 7.479 — ABNORMAL HIGH (ref 7.35–7.45)
pO2, Arterial: 66 mmHg — ABNORMAL LOW (ref 83–108)

## 2022-09-20 LAB — GLUCOSE, CAPILLARY
Glucose-Capillary: 155 mg/dL — ABNORMAL HIGH (ref 70–99)
Glucose-Capillary: 74 mg/dL (ref 70–99)
Glucose-Capillary: 142 mg/dL — ABNORMAL HIGH (ref 70–99)
Glucose-Capillary: 155 mg/dL — ABNORMAL HIGH (ref 70–99)
Glucose-Capillary: 132 mg/dL — ABNORMAL HIGH (ref 70–99)
Glucose-Capillary: 155 mg/dL — ABNORMAL HIGH (ref 70–99)

## 2022-09-20 LAB — MAGNESIUM
Magnesium: 2.4 mg/dL (ref 1.7–2.4)
Magnesium: 2.3 mg/dL (ref 1.7–2.4)

## 2022-09-20 LAB — PHOSPHORUS: Phosphorus: 4.1 mg/dL (ref 2.5–4.6)

## 2022-09-20 LAB — SURGICAL PATHOLOGY

## 2022-09-20 MED ORDER — PEPTAMEN 1.5 CAL PO LIQD
1000.0000 mL | ORAL | Status: DC
  Filled 2022-09-20: qty 1000

## 2022-09-20 MED ORDER — PROSOURCE TF20 ENFIT COMPATIBL EN LIQD
60.0000 mL | Freq: Four times a day (QID) | ENTERAL | Status: DC
  Administered 2022-09-20 – 2022-09-21 (×5): 60 mL
  Filled 2022-09-20 (×5): qty 60

## 2022-09-20 MED ORDER — VITAL 1.5 CAL PO LIQD
1000.0000 mL | ORAL | Status: DC
  Administered 2022-09-20: 1000 mL
  Filled 2022-09-20: qty 1000

## 2022-09-20 MED ORDER — LEVALBUTEROL HCL 0.63 MG/3ML IN NEBU
0.6300 mg | INHALATION_SOLUTION | Freq: Three times a day (TID) | RESPIRATORY_TRACT | Status: DC
  Administered 2022-09-20 – 2022-09-24 (×13): 0.63 mg via RESPIRATORY_TRACT
  Filled 2022-09-20 (×13): qty 3

## 2022-09-20 MED ORDER — POTASSIUM CHLORIDE 20 MEQ PO PACK
40.0000 meq | PACK | Freq: Once | ORAL | Status: AC
  Administered 2022-09-20: 40 meq
  Filled 2022-09-20: qty 2

## 2022-09-20 MED FILL — Lidocaine HCl Local Preservative Free (PF) Inj 2%: INTRAMUSCULAR | Qty: 14 | Status: AC

## 2022-09-20 MED FILL — Heparin Sodium (Porcine) Inj 1000 Unit/ML: Qty: 1000 | Status: AC

## 2022-09-20 MED FILL — Potassium Chloride Inj 2 mEq/ML: INTRAVENOUS | Qty: 40 | Status: AC

## 2022-09-20 NOTE — Progress Notes (Signed)
3 Days Post-Op Procedure(s) (LRB): REPAIR OF ACUTE ASCENDING THORACIC AORTIC DISSECTION USING 28 MM HEMASHIELD PLATINUM VASCULAR GRAFT (N/A) Subjective: Intubated, sedated  Objective: Vital signs in last 24 hours: Temp:  [97.5 F (36.4 C)-98.8 F (37.1 C)] 98.4 F (36.9 C) (11/07 0400) Pulse Rate:  [79-81] 80 (11/07 0700) Cardiac Rhythm: Atrial paced (11/07 0400) Resp:  [12-25] 16 (11/07 0700) BP: (74-144)/(48-68) 121/68 (11/07 0700) SpO2:  [87 %-100 %] 96 % (11/07 0715) Arterial Line BP: (100-157)/(33-62) 149/50 (11/07 0700) FiO2 (%):  [30 %-40 %] 30 % (11/07 0715) Weight:  [92.9 kg] 92.9 kg (11/07 0448)  Hemodynamic parameters for last 24 hours: PAP: (17-29)/(3-15) 22/10 CVP:  [4 mmHg-15 mmHg] 6 mmHg CO:  [6.6 L/min] 6.6 L/min CI:  [3.2 L/min/m2] 3.2 L/min/m2  Intake/Output from previous day: 11/06 0701 - 11/07 0700 In: 3138.9 [I.V.:1985; Blood:804; IV Piggyback:349.9] Out: 0347 [Urine:4355; Emesis/NG output:200; Chest Tube:410] Intake/Output this shift: No intake/output data recorded.  General appearance: cooperative Neurologic: left sided weakness Heart: regular rate and rhythm Lungs: rhonchi bilaterally Abdomen: + BS  Lab Results: Recent Labs    09/19/22 1507 09/20/22 0400  WBC 20.1* 14.8*  HGB 7.8* 8.2*  HCT 22.8* 23.6*  PLT 58* 51*   BMET:  Recent Labs    09/19/22 1507 09/20/22 0400  NA 141 142  K 3.4* 3.4*  CL 106 106  CO2 23 25  GLUCOSE 158* 151*  BUN 29* 30*  CREATININE 2.11* 2.15*  CALCIUM 7.8* 7.4*    PT/INR:  Recent Labs    09/18/22 0220  LABPROT 13.8  INR 1.1   ABG    Component Value Date/Time   PHART 7.393 09/18/2022 1629   HCO3 24.0 09/18/2022 1629   TCO2 25 09/18/2022 1629   ACIDBASEDEF 1.0 09/18/2022 1629   O2SAT 97 09/18/2022 1629   CBG (last 3)  Recent Labs    09/19/22 2331 09/20/22 0411 09/20/22 0741  GLUCAP 152* 74 142*    Assessment/Plan: S/P Procedure(s) (LRB): REPAIR OF ACUTE ASCENDING THORACIC AORTIC  DISSECTION USING 28 MM HEMASHIELD PLATINUM VASCULAR GRAFT (N/A) POD # 3 NEURO- left hemiparesis persists, following commands on R. Did move Left arm weakly spontaneously but not to command  CT last night stable from earlier in day- right sided CVAs  ASA if [facility] with Neurology CV- sinus brady, paced at 80  Discrepancy between A line and cuff- will use cuff pressures  On low dose neo at present RESP- will attempt to wean vent this AM, will depend on mental status RENAL- creatinine stable at 2.15  Will decrease Lasix drip to 4 ENDO- CBG still mildly elevated, continue levemir + SSI GI- will hold off on tube feeds this AM while trying to wean vent  If unable to extubate will start TF this afternoon Anemia- Hgb 8.2 after 2 units PRBC yesterday Thrombocytopenia persists- no enoxaparin SCD for DVT prophylaxis    LOS: 3 days    James Estrada 09/20/2022

## 2022-09-20 NOTE — Progress Notes (Addendum)
Advanced Heart Failure Rounding Note  PCP-Cardiologist: None   Subjective:    POD#3  Off Cardene gtt. Got hypotensive yesterday, now on NEO at 5/min. MAP 78   Swan out  Remains intubated. Not following commands. Head CT yesterday showed small  acute cortical/subcortical infarcts within the right MCA vascular territory. 4 mm focus of hemorrhage associated with an infarct within the anterior right frontal lobe.   CTA 1. No large vessel occlusion. 2. Partially visualized known aortic dissection extending into the brachiocephalic artery, proximal right subclavian artery, and origin of the right common carotid artery. Neuro following.   On lasix gtt at 8/hr. Diuresing well. 4.5 L in UOP yesterday. Wt down 6 lb. SCr 1.84>>2.11>>2.15  K 3.4   On amio gtt 30/hr. A-paced. No further afib.   WBC trending down 20>>14K  Hgb 7.1>>7.8>>8.2    Objective:   Weight Range: 92.9 kg Body mass index is 27.78 kg/m.   Vital Signs:   Temp:  [97.5 F (36.4 C)-98.8 F (37.1 C)] 98.4 F (36.9 C) (11/07 0400) Pulse Rate:  [79-81] 80 (11/07 0700) Resp:  [12-25] 16 (11/07 0700) BP: (74-144)/(48-68) 121/68 (11/07 0700) SpO2:  [87 %-100 %] 96 % (11/07 0715) Arterial Line BP: (100-157)/(33-62) 149/50 (11/07 0700) FiO2 (%):  [30 %-40 %] 30 % (11/07 0715) Weight:  [92.9 kg] 92.9 kg (11/07 0448) Last BM Date :  (PTA)  Weight change: Filed Weights   09/17/22 1843 09/19/22 0500 09/20/22 0448  Weight: 83 kg 95.5 kg 92.9 kg    Intake/Output:   Intake/Output Summary (Last 24 hours) at 09/20/2022 0737 Last data filed at 09/20/2022 0700 Gross per 24 hour  Intake 3138.93 ml  Output 4965 ml  Net -1826.07 ml      Physical Exam    General: elderly male, intubated. No distress HEENT: Normal + ETT  Neck: Supple. JVP not well visualized . Carotids 2+ bilat; no bruits. No lymphadenopathy or thyromegaly appreciated. Cor: PMI nondisplaced. Regular rate & rhythm. No rubs, gallops or murmurs. +  CTs Lungs: intubated and Clear  Abdomen: Soft, nontender, nondistended. No hepatosplenomegaly. No bruits or masses. Good bowel sounds. Extremities: No cyanosis, clubbing, rash, edema GU: + foley  Neuro: intubated, not following commands    Telemetry   A-paced, 80s   EKG    No new EKG to review   Labs    CBC Recent Labs    09/17/22 1722 09/17/22 1743 09/19/22 1507 09/20/22 0400  WBC 13.3*   < > 20.1* 14.8*  NEUTROABS 9.5*  --   --   --   HGB 13.8   < > 7.8* 8.2*  HCT 43.0   < > 22.8* 23.6*  MCV 87.6   < > 84.8 85.2  PLT 115*   < > 58* 51*   < > = values in this interval not displayed.   Basic Metabolic Panel Recent Labs    09/19/22 1507 09/20/22 0400  NA 141 142  K 3.4* 3.4*  CL 106 106  CO2 23 25  GLUCOSE 158* 151*  BUN 29* 30*  CREATININE 2.11* 2.15*  CALCIUM 7.8* 7.4*  MG 2.5* 2.4   Liver Function Tests Recent Labs    09/17/22 1722  AST 20  ALT 13  ALKPHOS 45  BILITOT 0.8  PROT 5.6*  ALBUMIN 3.4*   No results for input(s): "LIPASE", "AMYLASE" in the last 72 hours. Cardiac Enzymes No results for input(s): "CKTOTAL", "CKMB", "CKMBINDEX", "TROPONINI" in the last 72 hours.  BNP: BNP (last 3 results) No results for input(s): "BNP" in the last 8760 hours.  ProBNP (last 3 results) No results for input(s): "PROBNP" in the last 8760 hours.   D-Dimer Recent Labs    09/18/22 0220  DDIMER >20.00*   Hemoglobin A1C Recent Labs    09/19/22 1507  HGBA1C 5.5   Fasting Lipid Panel Recent Labs    09/19/22 0528  CHOL 82  HDL 22*  LDLCALC 42  TRIG 92  CHOLHDL 3.7   Thyroid Function Tests No results for input(s): "TSH", "T4TOTAL", "T3FREE", "THYROIDAB" in the last 72 hours.  Invalid input(s): "FREET3"  Other results:   Imaging    CT HEAD WO CONTRAST (5MM)  Result Date: 09/19/2022 CLINICAL DATA:  Stroke, follow-up EXAM: CT HEAD WITHOUT CONTRAST TECHNIQUE: Contiguous axial images were obtained from the base of the skull through the  vertex without intravenous contrast. RADIATION DOSE REDUCTION: This exam was performed according to the departmental dose-optimization program which includes automated exposure control, adjustment of the mA and/or kV according to patient size and/or use of iterative reconstruction technique. COMPARISON:  09/19/2022 10:44 a.m. FINDINGS: Brain: Redemonstrated hypodensity in the right MCA territory, including foci in the right precentral gyrus (series 3, image 30) and left caudate/corona radiata (series 3, image 23), as well as possibly the right cerebellum (series 3, image 10). Redemonstrated focus of hemorrhage associated with an anterior right frontal lobe infarct (series 3, image 23). No evidence of increasing intraparenchymal hemorrhage. No evidence of additional acute infarct, mass, mass effect, or midline shift. No hydrocephalus or extra-axial fluid collection. Vascular: No definite hyperdense vessel. Skull: Normal. Negative for fracture or focal lesion. Sinuses/Orbits: No acute finding. Status post bilateral lens replacements. Other: The mastoid air cells are well aerated. IMPRESSION: Redemonstrated infarcts in the right MCA territory, including foci in the right precentral gyrus and left caudate/corona radiata, as well as a hemorrhagic focus in the anterior right frontal lobe. No evidence of increasing intraparenchymal hemorrhage. Electronically Signed   By: Merilyn Baba M.D.   On: 09/19/2022 21:35   CT ANGIO HEAD NECK W WO CM  Result Date: 09/19/2022 CLINICAL DATA:  Neuro deficit, acute, stroke suspected. Left-sided weakness. Known type A aortic dissection. EXAM: CT ANGIOGRAPHY HEAD AND NECK TECHNIQUE: Multidetector CT imaging of the head and neck was performed using the standard protocol during bolus administration of intravenous contrast. Multiplanar CT image reconstructions and MIPs were obtained to evaluate the vascular anatomy. Carotid stenosis measurements (when applicable) are obtained utilizing  NASCET criteria, using the distal internal carotid diameter as the denominator. RADIATION DOSE REDUCTION: This exam was performed according to the departmental dose-optimization program which includes automated exposure control, adjustment of the mA and/or kV according to patient size and/or use of iterative reconstruction technique. CONTRAST:  42mL OMNIPAQUE IOHEXOL 350 MG/ML SOLN COMPARISON:  CTA chest, abdomen, and pelvis 09/17/2022 FINDINGS: CTA NECK FINDINGS Aortic arch: The aortic arch, including the arch vessel origins, was incompletely imaged. The previously shown aortic dissection is partially covered in the distal aspect of the arch. The dissection is again noted to extend into the included portion of the brachiocephalic artery and proximal right subclavian artery, with the false lumen now being opacified as opposed to having a thrombosed appearance on the prior CTA and with decreased narrowing of the true lumen. The dissection flap also slightly extends into the origin of the right common carotid artery. Right carotid system: Patent without evidence of stenosis. Minimal atheromatous irregularity. Left carotid system: Patent without evidence  of stenosis or dissection, although the common carotid origin was not imaged. Minimal atheromatous irregularity. Vertebral arteries: Patent without evidence of a significant stenosis or dissection within limitations of dental streak artifact. Skeleton: Mild cervical spondylosis. Other neck: Thyroidectomy.  Right internal jugular venous catheter. Upper chest: Partially visualized right larger than left pleural effusions with compressive atelectasis. Endotracheal tube terminating at the level of the clavicular heads. Partially visualized enteric tube. Review of the MIP images confirms the above findings CTA HEAD FINDINGS Anterior circulation: The internal carotid arteries are patent from skull base to carotid termini with mild atherosclerotic plaque bilaterally not  resulting in significant stenosis. There are broad-based 2 mm aneurysm is projecting laterally from the left cavernous ICA and right supraclinoid ICA. ACAs and MCAs are patent without evidence of a proximal branch occlusion or significant proximal stenosis. Mild generalized ectatic appearance of the intracranial ICAs, ACAs, and MCAs diffusely. Posterior circulation: The intracranial vertebral arteries are patent to the basilar. Patent PICA, AICA, and SCA origins are seen bilaterally. The basilar artery is widely patent. There is a large left posterior communicating artery with absence of the left P1 segment. Both PCAs are patent without evidence of a significant proximal stenosis. No aneurysm is identified. Venous sinuses: Patent. Anatomic variants: Fetal left PCA. Review of the MIP images confirms the above findings IMPRESSION: 1. No large vessel occlusion. 2. Partially visualized known aortic dissection extending into the brachiocephalic artery, proximal right subclavian artery, and origin of the right common carotid artery. 3. Mild intracranial and cervical carotid atherosclerosis without significant proximal stenosis. 4. Small bilateral ICA aneurysms. 5. Bilateral pleural effusions. Electronically Signed   By: Logan Bores M.D.   On: 09/19/2022 17:09   CT HEAD WO CONTRAST (5MM)  Result Date: 09/19/2022 CLINICAL DATA:  Neuro deficit, acute, stroke suspected. EXAM: CT HEAD WITHOUT CONTRAST TECHNIQUE: Contiguous axial images were obtained from the base of the skull through the vertex without intravenous contrast. RADIATION DOSE REDUCTION: This exam was performed according to the departmental dose-optimization program which includes automated exposure control, adjustment of the mA and/or kV according to patient size and/or use of iterative reconstruction technique. COMPARISON:  Noncontrast head CT 09/17/2022. FINDINGS: Brain: Mild generalized cerebral atrophy. New from the prior head CT of 09/17/2022, there are  multiple small acute cortical/subcortical infarcts scattered within the right MCA territory, within the right frontal and parietal lobes. Notably, an infarct involves the right precentral gyrus (series 3, image 30). There is a 4 mm focus of acute parenchymal hemorrhage associated with an infarct in the anterior right frontal lobe (series 3, image 25). Adjacent to this, there is a small curvilinear focus of hyperdensity along, also along the anterior right frontal lobe, which may reflect additional small volume acute hemorrhage or hyperdense thrombus within a distal right MCA vessel (series 6, image 19) (series 3, image 24). Additionally, there is a new acute infarct within the right corona radiata/caudate nucleus, measuring 1.9 x 1.5 x 2.2 cm (for instance as seen on series 3, image 23) (series 5, image 28). Background mild patchy and ill-defined hypoattenuation within the cerebral white matter, nonspecific but compatible with chronic small vessel disease. Tiny infarct within the right cerebellar hemisphere, not definitively present on the prior head CT of 09/17/2022 and possibly acute (series 3, image 7). No extra-axial fluid collection. No evidence of an intracranial mass. No midline shift. Vascular: Possible hyperdense thrombus within a distal right MCA vessel, as described above. Atherosclerotic calcifications. Skull: No fracture or aggressive osseous lesion. Sinuses/Orbits:  No orbital mass or acute orbital finding. Mild mucosal thickening scattered within the bilateral ethmoid air cells. Impressions #1 and #2 called by telephone at the time of interpretation on 09/19/2022 at 10:41 am to provider [facility] , who verbally acknowledged these results. IMPRESSION: Interval development of patchy small acute cortical/subcortical infarcts within the right MCA vascular territory. 4 mm focus of hemorrhage associated with an infarct within the anterior right frontal lobe. Adjacent small curvilinear focus of  hyperdensity, also along the anterior right frontal lobe, which may reflect additional small-volume acute hemorrhage or hyperdense thrombus within a distal right MCA vessel. New acute infarct within the right corona radiata/basal ganglia, measuring 1.9 x 2.2 cm. Tiny infarct within the right cerebellar hemisphere, not definitively present on the prior head CT of 09/17/2022 and possibly acute. Background mild chronic small vessel changes within the cerebral white matter matter. Mild generalized cerebral atrophy. Electronically Signed   By: Kellie Simmering D.O.   On: 09/19/2022 10:44     Medications:     Scheduled Medications:  sodium chloride   Intravenous Once   acetaminophen  1,000 mg Oral Q6H   Or   acetaminophen (TYLENOL) oral liquid 160 mg/5 mL  1,000 mg Per Tube Q6H   acetaminophen (TYLENOL) oral liquid 160 mg/5 mL  650 mg Per Tube Once   Or   acetaminophen  650 mg Rectal Once   amiodarone  150 mg Intravenous Once   bisacodyl  10 mg Oral Daily   Or   bisacodyl  10 mg Rectal Daily   Chlorhexidine Gluconate Cloth  6 each Topical Daily   docusate  200 mg Per Tube Daily   famotidine  20 mg Per Tube Daily   insulin aspart  0-24 Units Subcutaneous Q4H   insulin detemir  12 Units Subcutaneous BID   metoprolol tartrate  12.5 mg Oral BID   Or   metoprolol tartrate  12.5 mg Per Tube BID   mupirocin ointment  1 Application Nasal BID   mouth rinse  15 mL Mouth Rinse Q2H   potassium chloride  40 mEq Per Tube Once   sodium chloride flush  10-40 mL Intracatheter Q12H   sodium chloride flush  3 mL Intravenous Q12H    Infusions:  sodium chloride     sodium chloride     sodium chloride 10 mL/hr at 09/20/22 0700   amiodarone 30 mg/hr (09/20/22 0700)   dexmedetomidine (PRECEDEX) IV infusion 0.3 mcg/kg/hr (09/20/22 0700)   furosemide (LASIX) 200 mg in dextrose 5 % 100 mL (2 mg/mL) infusion 8 mg/hr (09/20/22 0700)   lactated ringers     lactated ringers 20 mL/hr at 09/20/22 0700   niCARDipine  Stopped (09/19/22 1647)   phenylephrine (NEO-SYNEPHRINE) Adult infusion 5 mcg/min (09/20/22 0700)    PRN Medications: sodium chloride, metoprolol tartrate, midazolam, morphine injection, ondansetron (ZOFRAN) IV, mouth rinse, oxyCODONE, sodium chloride flush, sodium chloride flush, traMADol    Patient Profile   81 y/o male w/ h/o HTN, HLD, AAA s/p stent graft admitted w/ Type 1 Aortic Dissection, s/p emergent repair.   Assessment/Plan   1. Type 1 Aortic Dissection  - s/p emergent repair 11/4 - on NEO 5 for BP support, MAP 70s  - ASA 81 mg  - management per CT surgery    2. Hypertension  - now hypotensive, off Cardene gtt, on NEO 5  - MAPs now 70s  - parameters per CT surgery and Neuro    3. HFpEF - Intra-op TEE EF 60-65%,  RV ok - volume up, post large blood/ fluid resuscitation due to problem #1 + RBP  - Good diuresis w/ lasix gtt at 8/hr. Wt trending down, renal fx stable  - GDMT limited by hypotension and AKI    4. ABLA/ Thrombocytopenia  - dissection w/ RBP + expected blood loss in OR - required multiple units of PRBC's, Plts, FFP, and Cryoprecipitate in OR - Hgb 8.2 today  - watch Plts, 68>>57>>51K    5. Post-Op PAF - amio gtt 30/hr  - A-paced on tele, HR 70s  - keep K > 4.0 and Mg > 2.0    6. Pericardial Effusion  - mod by Intra-op TEE, no tamponade - CTs remain in place, 410 cc output yesterday  - plan TTE in several days     7. Acute Hypoxic Respiratory Failure - remains intubated  - CXR w/ left PTX + mild edema  - per CT surgery  - needs diuresis to help facilitate vent wean    8. Rt Sided Strokes   - CT head 11/6 showed 3 small right-sided strokes 1 in the cerebellum 1 in the right basal ganglia and 1 in the frontal lobe with a 4 mm area of bleeding  - not following commands  - neurology following  - routine EEG to rule out seizures. - hold off on antiplatelet/AC. - HbA1c 5.5,  LDL 42 - Keep SBP below 867E systolic.    9. AKI  - B/l Scr  ~1.0>>spiked to 1.90  - suspect ATN from hemorrhagic shock/hypotension, now post resuscitation - SCr 2.11>>2.15 today  - nonoliguric, 4.5 in UOP yesterday  - follow w/ diuresis  - keep MAPs > 70   10. Hypokalemia - K 3.4 - Supp w/ KCl   Length of Stay: 3  Lyda Jester, PA-C  09/20/2022, 7:37 AM  Advanced Heart Failure Team Pager [phone] (M-F; 7a - 5p)  Please contact [facility] for night-coverage after hours (5p -7a ) and weekends on [email or web address]  Agree with above.   Remains intubated. Sitting up in bed awake but not following commands for me. Apparently was able to follow commands on both sides earlier this am but was weak on left.   BP labile. Diureses well but Scr up slightly. (Now plateauing).   CT and CTA reviewed personally  General:  Sitting up in bed. On vent. Awake not following commands for me. No resp difficulty HEENT: normal Neck: supple. no JVD. Carotids 2+ bilat; no bruits. No lymphadenopathy or thryomegaly appreciated. Cor: Sternal dressing ok  Regular rate & rhythm. No rubs, gallops or murmurs. Lungs: clear Abdomen: soft, nontender, nondistended. No hepatosplenomegaly. No bruits or masses. Good bowel sounds. Extremities: no cyanosis, clubbing, rash, 1+ edema Neuro: as above  Continue to hold sedation and work toward extubation. Appreciate Neuro recs. Would follow BPs on left given involvement of R SCA in dissection. MAPs 70-80s. No AC. Needs a bit more diuresis as tolerated.   CRITICAL CARE Performed by: Glori Bickers  Total critical care time: 40 minutes  Critical care time was exclusive of separately billable procedures and treating other patients.  Critical care was necessary to treat or prevent imminent or life-threatening deterioration.  Critical care was time spent personally by me (independent of midlevel providers or residents) on the following activities: development of treatment plan with patient and/or surrogate as well as  nursing, discussions with consultants, evaluation of patient's response to treatment, examination of patient, obtaining history from patient or surrogate, ordering and performing treatments  and interventions, ordering and review of laboratory studies, ordering and review of radiographic studies, pulse oximetry and re-evaluation of patient's condition.  Glori Bickers, MD  8:54 AM

## 2022-09-20 NOTE — Progress Notes (Signed)
     [address]       [place] [address]             [phone]       EVENING ROUNDS  Events of day noted with more able to follow commands but becomes agitated reaching for tubes and lines. Will allow restraints if needed. Hemodynamics stable Good urine output

## 2022-09-20 NOTE — Progress Notes (Signed)
Pt tolerating vent mode changed to a pressure support of 5 over a PEEP of 5.   Vt: 450cc RR: 20 Ve: 8.4  Wean mode started at 0718.

## 2022-09-20 NOTE — Progress Notes (Addendum)
Neurology Progress Note   S:// Patients wife and daughter are in the room. He is still intubated  and has been following commands early this am.RN states he did become agitated and required sedation. His family is aware about his current CT SCAN Head showing 3 small right sided strokes in cerebellum, right basal ganglia and frontal lobe with 4 mm area of bleeding, This was explained to them as being embolic showering which occurred perioperatively. This is not a large stroke and patient seems to have mild left sided weakness, however he is moving all extremities spontaneously. Unable to obtain MRI BRAN. Due to  pacing wires  O:// Current vital signs: BP 133/77 (BP Location: Left Arm)   Pulse 80   Temp 98.5 F (36.9 C) (Axillary)   Resp (!) 22   Ht 6' (1.829 m)   Wt 92.9 kg   SpO2 97%   BMI 27.78 kg/m  Vital signs in last 24 hours: Temp:  [97.5 F (36.4 C)-98.5 F (36.9 C)] 98.5 F (36.9 C) (11/07 1539) Pulse Rate:  [77-81] 80 (11/07 1600) Resp:  [12-24] 22 (11/07 1600) BP: (77-157)/(51-110) 133/77 (11/07 1600) SpO2:  [89 %-100 %] 97 % (11/07 1600) Arterial Line BP: (111-157)/(37-62) 149/50 (11/07 0700) FiO2 (%):  [30 %-40 %] 40 % (11/07 1600) Weight:  [92.9 kg] 92.9 kg (11/07 0448)  NEURO:  Patient is sedated on propofol and intubated  Eyes are closed.  Opens eyes to voice and noxious stimulation. He grimaced to noxious stimulation Does not follow commands Pupils equal round and reactive,Ocular cephalic reflex intact Face appears symmetrical Positive cough/gag reflex Motor: Moves all extremity spontaneously . Left side less then right side  Withdraws lower extremities to noxious stimulation Toes are upgoing  Gait : not tested    Medications  Current Facility-Administered Medications:    0.45 % sodium chloride infusion, , Intravenous, Continuous PRN, Roddenberry, Myron G, PA-C   0.9 %  sodium chloride infusion (Manually program via Guardrails IV Fluids), ,  Intravenous, Once, [name], MD   0.9 %  sodium chloride infusion, 250 mL, Intravenous, Continuous, Roddenberry, Myron G, PA-C   0.9 %  sodium chloride infusion, , Intravenous, Continuous, Roddenberry, Myron G, PA-C, Last Rate: 10 mL/hr at 09/20/22 1600, Infusion Verify at 09/20/22 1600   acetaminophen (TYLENOL) tablet 1,000 mg, 1,000 mg, Oral, Q6H, 1,000 mg at 09/20/22 0502 **OR** acetaminophen (TYLENOL) 160 MG/5ML solution 1,000 mg, 1,000 mg, Per Tube, Q6H, Roddenberry, Myron G, PA-C, 1,000 mg at 09/20/22 1157   acetaminophen (TYLENOL) 160 MG/5ML solution 650 mg, 650 mg, Per Tube, Once **OR** acetaminophen (TYLENOL) suppository 650 mg, 650 mg, Rectal, Once, Roddenberry, Myron G, PA-C   amiodarone (NEXTERONE) 1.8 mg/mL load via infusion 150 mg, 150 mg, Intravenous, Once **FOLLOWED BY** [EXPIRED] amiodarone (NEXTERONE PREMIX) 360-4.14 MG/200ML-% (1.8 mg/mL) IV infusion, 60 mg/hr, Intravenous, Continuous, Stopped at 09/18/22 0340 **FOLLOWED BY** amiodarone (NEXTERONE PREMIX) 360-4.14 MG/200ML-% (1.8 mg/mL) IV infusion, 30 mg/hr, Intravenous, Continuous, Roddenberry, Myron G, PA-C, Last Rate: 16.67 mL/hr at 09/20/22 1600, 30 mg/hr at 09/20/22 1600   bisacodyl (DULCOLAX) EC tablet 10 mg, 10 mg, Oral, Daily **OR** bisacodyl (DULCOLAX) suppository 10 mg, 10 mg, Rectal, Daily, Roddenberry, Myron G, PA-C, 10 mg at 09/20/22 0944   Chlorhexidine Gluconate Cloth 2 % PADS 6 each, 6 each, Topical, Daily, [name], MD, 6 each at 09/20/22 0800   dexmedetomidine (PRECEDEX) 400 MCG/100ML (4 mcg/mL) infusion, 0-0.7 mcg/kg/hr, Intravenous, Continuous, Roddenberry, Myron G, PA-C, Last Rate: 4.15 mL/hr at  09/20/22 1600, 0.2 mcg/kg/hr at 09/20/22 1600   docusate (COLACE) 50 MG/5ML liquid 200 mg, 200 mg, Per Tube, Daily, Roddenberry, Myron G, PA-C, 200 mg at 09/20/22 0944   famotidine (PEPCID) tablet 20 mg, 20 mg, Per Tube, Daily, [name], MD, 20 mg at 09/20/22 0944   feeding supplement  (PROSource TF20) liquid 60 mL, 60 mL, Per Tube, QID, [name], MD, 60 mL at 09/20/22 1535   feeding supplement (VITAL 1.5 CAL) liquid 1,000 mL, 1,000 mL, Per Tube, Continuous, [name] Dace, RD   furosemide (LASIX) 200 mg in dextrose 5 % 100 mL (2 mg/mL) infusion, 4 mg/hr, Intravenous, Continuous, [name], MD, Last Rate: 2 mL/hr at 09/20/22 1600, 4 mg/hr at 09/20/22 1600   insulin aspart (novoLOG) injection 0-24 Units, 0-24 Units, Subcutaneous, Q4H, [name], MD, 2 Units at 09/20/22 1538   insulin detemir (LEVEMIR) injection 12 Units, 12 Units, Subcutaneous, BID, [name], MD, 12 Units at 09/20/22 0944   lactated ringers infusion, , Intravenous, Continuous, Roddenberry, Myron G, PA-C   lactated ringers infusion, , Intravenous, Continuous, Roddenberry, Myron G, PA-C, Stopped at 09/20/22 1403   levalbuterol (XOPENEX) nebulizer solution 0.63 mg, 0.63 mg, Nebulization, TID, [name], MD, 0.63 mg at 09/20/22 1458   metoprolol tartrate (LOPRESSOR) tablet 12.5 mg, 12.5 mg, Oral, BID, 12.5 mg at 09/19/22 2219 **OR** metoprolol tartrate (LOPRESSOR) 25 mg/10 mL oral suspension 12.5 mg, 12.5 mg, Per Tube, BID, Roddenberry, Myron G, PA-C, 12.5 mg at 09/20/22 0942   metoprolol tartrate (LOPRESSOR) injection 2.5-5 mg, 2.5-5 mg, Intravenous, Q2H PRN, Roddenberry, Myron G, PA-C   midazolam (VERSED) injection 2 mg, 2 mg, Intravenous, Q1H PRN, Roddenberry, Myron G, PA-C, 2 mg at 09/19/22 1638   morphine (PF) 2 MG/ML injection 1-4 mg, 1-4 mg, Intravenous, Q1H PRN, Roddenberry, Myron G, PA-C, 1 mg at 09/20/22 1011   mupirocin ointment (BACTROBAN) 2 % 1 Application, 1 Application, Nasal, BID, [name], MD, 1 Application at [id number]   nicardipine (CARDENE) 20mg  in 0.86% saline 232ml IV infusion (0.1 mg/ml), 3-15 mg/hr, Intravenous, Continuous, [name], MD, Stopped at 09/19/22 1647   ondansetron (ZOFRAN) injection 4 mg, 4  mg, Intravenous, Q6H PRN, Roddenberry, Myron G, PA-C   Oral care mouth rinse, 15 mL, Mouth Rinse, Q2H, [name], MD, 15 mL at 09/20/22 1600   Oral care mouth rinse, 15 mL, Mouth Rinse, PRN, [name], MD   oxyCODONE (Oxy IR/ROXICODONE) immediate release tablet 5-10 mg, 5-10 mg, Oral, Q3H PRN, Roddenberry, Myron G, PA-C   phenylephrine (NEO-SYNEPHRINE) 20mg /NS 262mL premix infusion, 0-100 mcg/min, Intravenous, Titrated, Gaye Pollack, MD, Stopped at 09/20/22 0735   sodium chloride flush (NS) 0.9 % injection 10-40 mL, 10-40 mL, Intracatheter, Q12H, [name], MD, 10 mL at 09/19/22 2220   sodium chloride flush (NS) 0.9 % injection 10-40 mL, 10-40 mL, Intracatheter, PRN, [name], MD   sodium chloride flush (NS) 0.9 % injection 3 mL, 3 mL, Intravenous, Q12H, Roddenberry, Myron G, PA-C, 3 mL at 09/19/22 1117   sodium chloride flush (NS) 0.9 % injection 3 mL, 3 mL, Intravenous, PRN, Roddenberry, Myron G, PA-C   traMADol (ULTRAM) tablet 50-100 mg, 50-100 mg, Oral, Q4H PRN, Malon [facility] CBC    Component Value Date/Time   WBC 14.8 (H) 09/20/2022 0400   RBC 2.77 (L) 09/20/2022 0400   HGB 8.2 (L) 09/20/2022 0830   HGB 13.8 05/28/2020 0819   HCT  24.0 (L) 09/20/2022 0830   HCT 41.5 05/28/2020 0819   PLT 51 (L) 09/20/2022 0400   PLT 151 05/28/2020 0819   MCV 85.2 09/20/2022 0400   MCV 84 05/28/2020 0819   MCH 29.6 09/20/2022 0400   MCHC 34.7 09/20/2022 0400   RDW 15.1 09/20/2022 0400   RDW 12.8 05/28/2020 0819   LYMPHSABS 2.3 09/17/2022 1722   LYMPHSABS 1.7 05/28/2020 0819   MONOABS 1.1 (H) 09/17/2022 1722   EOSABS 0.2 09/17/2022 1722   EOSABS 0.2 05/28/2020 0819   BASOSABS 0.1 09/17/2022 1722   BASOSABS 0.1 05/28/2020 0819    CMP     Component Value Date/Time   NA 141 09/20/2022 0830   K 3.9 09/20/2022 0830   CL 106 09/20/2022 0400   CO2 25 09/20/2022 0400   GLUCOSE 151 (H) 09/20/2022 0400   BUN 30 (H) 09/20/2022  0400   CREATININE 2.15 (H) 09/20/2022 0400   CALCIUM 7.4 (L) 09/20/2022 0400   PROT 5.6 (L) 09/17/2022 1722   PROT 6.3 05/28/2020 0819   ALBUMIN 3.4 (L) 09/17/2022 1722   ALBUMIN 3.8 05/28/2020 0819   AST 20 09/17/2022 1722   ALT 13 09/17/2022 1722   ALKPHOS 45 09/17/2022 1722   BILITOT 0.8 09/17/2022 1722   BILITOT 0.6 05/28/2020 0819   GFRNONAA 31 (L) 09/20/2022 0400   GFRAA >60 05/05/2020 0430    glycosylated hemoglobin  Lipid Panel     Component Value Date/Time   CHOL 82 09/19/2022 0528   CHOL 146 05/28/2020 0819   TRIG 92 09/19/2022 0528   HDL 22 (L) 09/19/2022 0528   HDL 43 05/28/2020 0819   CHOLHDL 3.7 09/19/2022 0528   VLDL 18 09/19/2022 0528   LDLCALC 42 09/19/2022 0528   LDLCALC 87 05/28/2020 0819     Imaging I have reviewed images in epic and the results pertinent to this consultation are:  CT-scan of the brain COMPARISON:  09/19/2022 10:44 a.m.   FINDINGS: Brain: Redemonstrated hypodensity in the right MCA territory, including foci in the right precentral gyrus (series 3, image 30) and left caudate/corona radiata (series 3, image 23), as well as possibly the right cerebellum (series 3, image 10). Redemonstrated focus of hemorrhage associated with an anterior right frontal lobe infarct (series 3, image 23). No evidence of increasing intraparenchymal hemorrhage. No evidence of additional acute infarct, mass, mass effect, or midline shift. No hydrocephalus or extra-axial fluid collection.   Vascular: No definite hyperdense vessel.   Skull: Normal. Negative for fracture or focal lesion.   Sinuses/Orbits: No acute finding. Status post bilateral lens replacements.   Other: The mastoid air cells are well aerated.   IMPRESSION: Redemonstrated infarcts in the right MCA territory, including foci in the right precentral gyrus and left caudate/corona radiata, as well as a hemorrhagic focus in the anterior right frontal lobe. No evidence of increasing  intraparenchymal hemorrhage.  CTA Head/Neck: 09/19/22  FINDINGS: CTA NECK FINDINGS   Aortic arch: The aortic arch, including the arch vessel origins, was incompletely imaged. The previously shown aortic dissection is partially covered in the distal aspect of the arch. The dissection is again noted to extend into the included portion of the brachiocephalic artery and proximal right subclavian artery, with the false lumen now being opacified as opposed to having a thrombosed appearance on the prior CTA and with decreased narrowing of the true lumen. The dissection flap also slightly extends into the origin of the right common carotid artery.   Right carotid system: Patent without  evidence of stenosis. Minimal atheromatous irregularity.   Left carotid system: Patent without evidence of stenosis or dissection, although the common carotid origin was not imaged. Minimal atheromatous irregularity.   Vertebral arteries: Patent without evidence of a significant stenosis or dissection within limitations of dental streak artifact.   Skeleton: Mild cervical spondylosis.   Other neck: Thyroidectomy.  Right internal jugular venous catheter.   Upper chest: Partially visualized right larger than left pleural effusions with compressive atelectasis. Endotracheal tube terminating at the level of the clavicular heads. Partially visualized enteric tube.   Review of the MIP images confirms the above findings   CTA HEAD FINDINGS   Anterior circulation: The internal carotid arteries are patent from skull base to carotid termini with mild atherosclerotic plaque bilaterally not resulting in significant stenosis. There are broad-based 2 mm aneurysm is projecting laterally from the left cavernous ICA and right supraclinoid ICA. ACAs and MCAs are patent without evidence of a proximal branch occlusion or significant proximal stenosis. Mild generalized ectatic appearance of the intracranial ICAs,  ACAs, and MCAs diffusely.   Posterior circulation: The intracranial vertebral arteries are patent to the basilar. Patent PICA, AICA, and SCA origins are seen bilaterally. The basilar artery is widely patent. There is a large left posterior communicating artery with absence of the left P1 segment. Both PCAs are patent without evidence of a significant proximal stenosis. No aneurysm is identified.   Venous sinuses: Patent.   Anatomic variants: Fetal left PCA.   Review of the MIP images confirms the above findings   IMPRESSION: 1. No large vessel occlusion. 2. Partially visualized known aortic dissection extending into the brachiocephalic artery, proximal right subclavian artery, and origin of the right common carotid artery. 3. Mild intracranial and cervical carotid atherosclerosis without significant proximal stenosis. 4. Small bilateral ICA aneurysms. 5. Bilateral pleural effusions.  EEG: 09/20/22 Date: 09/19/2022 Duration: 24.24 mins   Patient history: 81 year old male with right gaze deviation left-sided weakness.  EEG to evaluate for seizure.   Level of alertness: lethargic    AEDs during EEG study: None   Technical aspects: This EEG study was done with scalp electrodes positioned according to the 10-20 International system of electrode placement. Electrical activity was reviewed with band pass filter of 1-70Hz , sensitivity of 7 uV/mm, display speed of 68mm/sec with a 60Hz  notched filter applied as appropriate. EEG data were recorded continuously and digitally stored.  Video monitoring was available and reviewed as appropriate.   Description: EEG showed continuous generalized predominantly 5-9Hz  theta-alpha activity admixed with intermittent generalized 2-3Hz  delta slowing. Hyperventilation and photic stimulation were not performed.      ABNORMALITY - Continuous slow, generalized   IMPRESSION: This study is suggestive of moderate to severe diffuse encephalopathy,  nonspecific etiology. No seizures or epileptiform discharges were seen throughout the recording.  Assessment:  81 year old male with a history of AAA s/p repair, presenting with acute onset of severe left worse than RLE weakness in conjunction with left flank/back pain resulting in collapse at home. STAT CT head on presentation was with no acute abnormality. 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 Patient is still intubated  and current stroke is secondary to current cardiac surgery. 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 Perioperative  s/p aortic arch repair  Recommendations:  Neuro  checks per unit protocol EEG completed this am  Stat CT HEAD with any acute neurological changes Hold Antiplatelet  and anticoagulation MRI Brain needed to assess additional strokes not seen on CT SCAN . Its held at this timr secondary to epicardial pacing wires. This can be arrangedt later . Did discuss with patients family  -- Elenora Gamma , PA-C [facility]   STROKE MD NOTE :  I have personally obtained history,examined this patient, reviewed notes, independently viewed imaging studies, participated in medical decision making and plan of care.ROS completed by me personally and pertinent positives fully documented  I have made any additions or clarifications directly to the above note. Agree with note above.  Patient developed leg weakness following aortic arch repair for dissection and MRI scan shows multiple small embolic infarcts likely periprocedural post aortic repair.  Recommend aspirin for stroke prevention if safe from surgical standpoint.  EEG shows generalized slowing without any epileptiform activity.  Check lipid profile.  Aggressive  risk factor modification.  MRI not possible due to pacing leads.  Extubate as tolerated per cardiothoracic surgery and critical care team.  Patient's prognosis from neurological standpoint seems pretty good as his exam seems pretty much nonfocal with only mild left-sided weakness.  Long discussion with patient's wife and daughter at the bedside and answered questions.  This patient is critically ill and at significant risk of neurological worsening, death and care requires constant monitoring of vital signs, hemodynamics,respiratory and cardiac monitoring, extensive review of multiple databases, frequent neurological assessment, discussion with family, other specialists and medical decision making of high complexity.I have made any additions or clarifications directly to the above note.This critical care time does not reflect procedure time, or teaching time or supervisory time of PA/NP/Med Resident etc but could involve care discussion time.  I spent 30 minutes of neurocritical care time  in the care of  this patient.     Antony Contras, MD Medical Director [facility] Pager: [phone] 09/20/2022 6:15 PM

## 2022-09-20 NOTE — Procedures (Signed)
Patient Name: James Estrada  MRN: [id number]  Epilepsy Attending: Lora Havens  Referring Physician/Provider: Donnetta Simpers, MD  Date: 09/19/2022 Duration: 24.24 mins  Patient history: 81 year old male with right gaze deviation left-sided weakness.  EEG to evaluate for seizure.  Level of alertness: lethargic   AEDs during EEG study: None  Technical aspects: This EEG study was done with scalp electrodes positioned according to the 10-20 International system of electrode placement. Electrical activity was reviewed with band pass filter of 1-70Hz , sensitivity of 7 uV/mm, display speed of 77mm/sec with a 60Hz  notched filter applied as appropriate. EEG data were recorded continuously and digitally stored.  Video monitoring was available and reviewed as appropriate.  Description: EEG showed continuous generalized predominantly 5-9Hz  theta-alpha activity admixed with intermittent generalized 2-3Hz  delta slowing. Hyperventilation and photic stimulation were not performed.     ABNORMALITY - Continuous slow, generalized  IMPRESSION: This study is suggestive of moderate to severe diffuse encephalopathy, nonspecific etiology. No seizures or epileptiform discharges were seen throughout the recording.  [name]

## 2022-09-21 ENCOUNTER — Inpatient Hospital Stay (HOSPITAL_COMMUNITY): Payer: Medicare Other

## 2022-09-21 ENCOUNTER — Encounter (HOSPITAL_COMMUNITY): Payer: Self-pay | Admitting: Thoracic Surgery (Cardiothoracic Vascular Surgery)

## 2022-09-21 DIAGNOSIS — R451 Restlessness and agitation: Secondary | ICD-10-CM | POA: Diagnosis not present

## 2022-09-21 DIAGNOSIS — Z9911 Dependence on respirator [ventilator] status: Secondary | ICD-10-CM

## 2022-09-21 DIAGNOSIS — Z9889 Other specified postprocedural states: Secondary | ICD-10-CM | POA: Diagnosis not present

## 2022-09-21 DIAGNOSIS — J9601 Acute respiratory failure with hypoxia: Secondary | ICD-10-CM | POA: Diagnosis not present

## 2022-09-21 DIAGNOSIS — J90 Pleural effusion, not elsewhere classified: Secondary | ICD-10-CM

## 2022-09-21 LAB — BPAM RBC
Blood Product Expiration Date: 202311112359
Blood Product Expiration Date: 202311232359
Blood Product Expiration Date: 202311232359
Blood Product Expiration Date: 202311232359
Blood Product Expiration Date: 202311232359
Blood Product Expiration Date: 202311232359
Blood Product Expiration Date: 202311232359
Blood Product Expiration Date: 202311232359
Blood Product Expiration Date: 202311232359
Blood Product Expiration Date: 202311232359
Blood Product Expiration Date: 202311232359
Blood Product Expiration Date: 202311232359
Blood Product Expiration Date: 202312052359
Blood Product Expiration Date: 202312052359
ISSUE DATE / TIME: 202311041815
ISSUE DATE / TIME: 202311041815
ISSUE DATE / TIME: 202311041921
ISSUE DATE / TIME: 202311041921
ISSUE DATE / TIME: 202311041921
ISSUE DATE / TIME: 202311042100
ISSUE DATE / TIME: 202311050952
ISSUE DATE / TIME: 202311051245
ISSUE DATE / TIME: 202311060836
ISSUE DATE / TIME: 202311060857
ISSUE DATE / TIME: 202311060857
ISSUE DATE / TIME: 202311061725
Unit Type and Rh: 5100
Unit Type and Rh: 5100
Unit Type and Rh: 6200
Unit Type and Rh: 6200
Unit Type and Rh: 6200
Unit Type and Rh: 6200
Unit Type and Rh: 6200
Unit Type and Rh: 6200
Unit Type and Rh: 6200
Unit Type and Rh: 6200
Unit Type and Rh: 6200
Unit Type and Rh: 6200
Unit Type and Rh: 6200
Unit Type and Rh: 6200

## 2022-09-21 LAB — TYPE AND SCREEN
ABO/RH(D): A POS
Antibody Screen: NEGATIVE
Unit division: 0
Unit division: 0
Unit division: 0
Unit division: 0
Unit division: 0
Unit division: 0
Unit division: 0
Unit division: 0
Unit division: 0
Unit division: 0
Unit division: 0
Unit division: 0
Unit division: 0
Unit division: 0

## 2022-09-21 LAB — CBC
HCT: 23 % — ABNORMAL LOW (ref 39.0–52.0)
Hemoglobin: 7.9 g/dL — ABNORMAL LOW (ref 13.0–17.0)
MCH: 29.7 pg (ref 26.0–34.0)
MCHC: 34.3 g/dL (ref 30.0–36.0)
MCV: 86.5 fL (ref 80.0–100.0)
Platelets: 50 10*3/uL — ABNORMAL LOW (ref 150–400)
RBC: 2.66 MIL/uL — ABNORMAL LOW (ref 4.22–5.81)
RDW: 15.4 % (ref 11.5–15.5)
WBC: 10.4 10*3/uL (ref 4.0–10.5)
nRBC: 0 % (ref 0.0–0.2)

## 2022-09-21 LAB — GLUCOSE, CAPILLARY
Glucose-Capillary: 128 mg/dL — ABNORMAL HIGH (ref 70–99)
Glucose-Capillary: 143 mg/dL — ABNORMAL HIGH (ref 70–99)
Glucose-Capillary: 138 mg/dL — ABNORMAL HIGH (ref 70–99)
Glucose-Capillary: 163 mg/dL — ABNORMAL HIGH (ref 70–99)
Glucose-Capillary: 120 mg/dL — ABNORMAL HIGH (ref 70–99)

## 2022-09-21 LAB — COMPREHENSIVE METABOLIC PANEL
ALT: 9 U/L (ref 0–44)
AST: 22 U/L (ref 15–41)
Albumin: 2.6 g/dL — ABNORMAL LOW (ref 3.5–5.0)
Alkaline Phosphatase: 32 U/L — ABNORMAL LOW (ref 38–126)
Anion gap: 9 (ref 5–15)
BUN: 43 mg/dL — ABNORMAL HIGH (ref 8–23)
CO2: 28 mmol/L (ref 22–32)
Calcium: 7.5 mg/dL — ABNORMAL LOW (ref 8.9–10.3)
Chloride: 106 mmol/L (ref 98–111)
Creatinine, Ser: 1.99 mg/dL — ABNORMAL HIGH (ref 0.61–1.24)
GFR, Estimated: 34 mL/min — ABNORMAL LOW (ref >60)
Glucose, Bld: 147 mg/dL — ABNORMAL HIGH (ref 70–99)
Potassium: 3.1 mmol/L — ABNORMAL LOW (ref 3.5–5.1)
Sodium: 143 mmol/L (ref 135–145)
Total Bilirubin: 0.9 mg/dL (ref 0.3–1.2)
Total Protein: 4.7 g/dL — ABNORMAL LOW (ref 6.5–8.1)

## 2022-09-21 LAB — MAGNESIUM
Magnesium: 2.5 mg/dL — ABNORMAL HIGH (ref 1.7–2.4)
Magnesium: 2.6 mg/dL — ABNORMAL HIGH (ref 1.7–2.4)

## 2022-09-21 LAB — PHOSPHORUS
Phosphorus: 3.3 mg/dL (ref 2.5–4.6)
Phosphorus: 3.6 mg/dL (ref 2.5–4.6)

## 2022-09-21 MED ORDER — SODIUM CHLORIDE 3 % IN NEBU
4.0000 mL | INHALATION_SOLUTION | Freq: Three times a day (TID) | RESPIRATORY_TRACT | Status: AC
  Administered 2022-09-21 – 2022-09-23 (×8): 4 mL via RESPIRATORY_TRACT
  Filled 2022-09-21 (×9): qty 15

## 2022-09-21 MED ORDER — OXYCODONE HCL 5 MG PO TABS
5.0000 mg | ORAL_TABLET | Freq: Four times a day (QID) | ORAL | Status: DC
  Administered 2022-09-21 – 2022-09-22 (×3): 5 mg
  Filled 2022-09-21 (×3): qty 1

## 2022-09-21 MED ORDER — VITAL 1.5 CAL PO LIQD
1000.0000 mL | ORAL | Status: DC
  Administered 2022-09-21 – 2022-09-24 (×3): 1000 mL
  Filled 2022-09-21 (×2): qty 1000

## 2022-09-21 MED ORDER — QUETIAPINE FUMARATE 25 MG PO TABS
25.0000 mg | ORAL_TABLET | Freq: Every day | ORAL | Status: DC
  Administered 2022-09-21: 25 mg
  Filled 2022-09-21: qty 1

## 2022-09-21 MED ORDER — POTASSIUM CHLORIDE 20 MEQ PO PACK
60.0000 meq | PACK | Freq: Once | ORAL | Status: AC
  Administered 2022-09-21: 60 meq
  Filled 2022-09-21: qty 3

## 2022-09-21 MED ORDER — CLONAZEPAM 0.25 MG PO TBDP
0.2500 mg | ORAL_TABLET | Freq: Two times a day (BID) | ORAL | Status: DC
  Administered 2022-09-21 – 2022-09-23 (×5): 0.25 mg
  Filled 2022-09-21 (×5): qty 1

## 2022-09-21 MED ORDER — SODIUM CHLORIDE 0.9 % IV SOLN: INTRAVENOUS | Status: DC | PRN

## 2022-09-21 MED ORDER — FENTANYL CITRATE PF 50 MCG/ML IJ SOSY
25.0000 ug | PREFILLED_SYRINGE | INTRAMUSCULAR | Status: DC | PRN
  Administered 2022-09-21: 50 ug via INTRAVENOUS
  Filled 2022-09-21: qty 1

## 2022-09-21 MED ORDER — PROSOURCE TF20 ENFIT COMPATIBL EN LIQD
60.0000 mL | Freq: Two times a day (BID) | ENTERAL | Status: DC
  Administered 2022-09-21 – 2022-09-23 (×3): 60 mL
  Filled 2022-09-21 (×3): qty 60

## 2022-09-21 MED ORDER — POTASSIUM CHLORIDE 10 MEQ/50ML IV SOLN
10.0000 meq | INTRAVENOUS | Status: AC
  Administered 2022-09-21 (×3): 10 meq via INTRAVENOUS
  Filled 2022-09-21 (×3): qty 50

## 2022-09-21 NOTE — Progress Notes (Signed)
4 Days Post-Op Procedure(s) (LRB): REPAIR OF ACUTE ASCENDING THORACIC AORTIC DISSECTION USING 28 MM HEMASHIELD PLATINUM VASCULAR GRAFT (N/A) Subjective: Agitated overnight and again this AM Following some commands intermittently  Objective: Vital signs in last 24 hours: Temp:  [98.1 F (36.7 C)-99.1 F (37.3 C)] 98.1 F (36.7 C) (11/08 0400) Pulse Rate:  [77-81] 81 (11/08 0700) Cardiac Rhythm: Atrial paced (11/08 0400) Resp:  [12-24] 23 (11/08 0700) BP: (89-157)/(52-110) 136/74 (11/08 0700) SpO2:  [89 %-99 %] 99 % (11/08 0700) Arterial Line BP: (109-180)/(43-73) 179/66 (11/08 0545) FiO2 (%):  [40 %] 40 % (11/08 0456) Weight:  [92.7 kg] 92.7 kg (11/08 0500)  Hemodynamic parameters for last 24 hours:    Intake/Output from previous day: 11/07 0701 - 11/08 0700 In: 1435.1 [I.V.:955.1; NG/GT:480] Out: 3855 [Urine:3490; Chest Tube:365] Intake/Output this shift: No intake/output data recorded.  General appearance: alert, cooperative, and no distress Neurologic: left sided weakness Heart: regular rate and rhythm Lungs: clear to auscultation bilaterally Wound: clean  Lab Results: Recent Labs    09/20/22 0400 09/20/22 0830 09/21/22 0522  WBC 14.8*  --  10.4  HGB 8.2* 8.2* 7.9*  HCT 23.6* 24.0* 23.0*  PLT 51*  --  50*   BMET:  Recent Labs    09/20/22 1806 09/21/22 0522  NA 142 143  K 3.3* 3.1*  CL 105 106  CO2 25 28  GLUCOSE 175* 147*  BUN 37* 43*  CREATININE 2.23* 1.99*  CALCIUM 7.4* 7.5*    PT/INR: No results for input(s): "LABPROT", "INR" in the last 72 hours. ABG    Component Value Date/Time   PHART 7.479 (H) 09/20/2022 0830   HCO3 25.2 09/20/2022 0830   TCO2 26 09/20/2022 0830   ACIDBASEDEF 1.0 09/18/2022 1629   O2SAT 94 09/20/2022 0830   CBG (last 3)  Recent Labs    09/20/22 1112 09/20/22 1535 09/20/22 1915  GLUCAP 155* 132* 155*    Assessment/Plan: S/P Procedure(s) (LRB): REPAIR OF ACUTE ASCENDING THORACIC AORTIC DISSECTION USING 28 MM  HEMASHIELD PLATINUM VASCULAR GRAFT (N/A) - NEURO- no significant change in left hemiparesis  Agitation is an issue, will try to get extubated CARDIAC- BP variable with level of agitation  In SR RESP- VDRF, good oxygenation  SBT this AM with hopes of extubation  CXR stable right pleural effusion RENAL- creatinine down slightly this AM  Hypokalemia- supplement  Continue diuresis ENDO- CBG reasonably well controlled Gi- TF on hold for possible extubation Thrombocytopenia- persists Anemia- Hgb stable   LOS: 4 days    Loreli Slot 09/21/2022

## 2022-09-21 NOTE — Progress Notes (Signed)
Patient agitated, restless, and dyssynchronous with ventilator. RT notified. Dexmedetomidine 469mcg/100ml (64mcg/ml) infusion increased from 0.28mcg/kg/hr to .5mcg/kg/hr. (Per report, sedation should be discontinued at 0500 for SBT attempt.)  This RN went to omnicell to pull additional pain medication. During this time, patient attempted to pull out ETT. Patient pulled ETT out 2cm with 2 RTs at bedside. This RN returned to room and administered 2mg /ml morphine injection.   Soft wrist restraints adjusted and maintained at this time.

## 2022-09-21 NOTE — Progress Notes (Signed)
EVENING ROUNDS NOTE :     [address]       [place] [address]             [phone]                 4 Days Post-Op Procedure(s) (LRB): REPAIR OF ACUTE ASCENDING THORACIC AORTIC DISSECTION USING 28 MM HEMASHIELD PLATINUM VASCULAR GRAFT (N/A)   Total Length of Stay:  LOS: 4 days  Events:   No events Remains on the vent Good uop    BP (!) 146/72   Pulse 71   Temp 97.7 F (36.5 C) (Axillary)   Resp 18   Ht 6' (1.829 m)   Wt 92.7 kg   SpO2 93%   BMI 27.72 kg/m   CVP:  [11 mmHg] 11 mmHg  Vent Mode: CPAP;PSV FiO2 (%):  [40 %] 40 % Set Rate:  [12 bmp] 12 bmp Vt Set:  [620 mL] 620 mL PEEP:  [5 cmH20] 5 cmH20 Pressure Support:  [5 cmH20-10 cmH20] 5 cmH20   sodium chloride     sodium chloride 10 mL/hr at 09/21/22 1400   amiodarone 30 mg/hr (09/21/22 1400)   dexmedetomidine (PRECEDEX) IV infusion Stopped (09/21/22 1053)   feeding supplement (VITAL 1.5 CAL) 1,000 mL (09/21/22 1520)   furosemide (LASIX) 200 mg in dextrose 5 % 100 mL (2 mg/mL) infusion 4 mg/hr (09/21/22 1400)   lactated ringers     lactated ringers Stopped (09/20/22 1403)   niCARDipine Stopped (09/19/22 1647)   phenylephrine (NEO-SYNEPHRINE) Adult infusion Stopped (09/20/22 0735)    I/O last 3 completed shifts: In: 2949 [I.V.:1747.5; Blood:530; NG/GT:480; IV Piggyback:191.5] Out: 6255 [Urine:5720; Emesis/NG output:50; Chest Tube:485]      Latest Ref Rng & Units 09/21/2022    5:22 AM 09/20/2022    8:30 AM 09/20/2022    4:00 AM  CBC  WBC 4.0 - 10.5 K/uL 10.4   14.8   Hemoglobin 13.0 - 17.0 g/dL 7.9  8.2  8.2   Hematocrit 39.0 - 52.0 % 23.0  24.0  23.6   Platelets 150 - 400 K/uL 50   51        Latest Ref Rng & Units 09/21/2022    5:22 AM 09/20/2022    6:06 PM 09/20/2022    8:30 AM  BMP  Glucose 70 - 99 mg/dL 277  824    BUN 8 - 23 mg/dL 43  37    Creatinine 2.35 - 1.24 mg/dL 3.61  4.43    Sodium 154 - 145 mmol/L 143  142  141   Potassium 3.5 - 5.1 mmol/L 3.1  3.3  3.9    Chloride 98 - 111 mmol/L 106  105    CO2 22 - 32 mmol/L 28  25    Calcium 8.9 - 10.3 mg/dL 7.5  7.4      ABG    Component Value Date/Time   PHART 7.479 (H) 09/20/2022 0830   PCO2ART 34.0 09/20/2022 0830   PO2ART 66 (L) 09/20/2022 0830   HCO3 25.2 09/20/2022 0830   TCO2 26 09/20/2022 0830   ACIDBASEDEF 1.0 09/18/2022 1629   O2SAT 94 09/20/2022 0830       James Gallery, MD 09/21/2022 4:39 PM

## 2022-09-21 NOTE — Progress Notes (Signed)
Neurology Progress Note   S:// Patients  daughter is in the room. He is still intubated   become agitated last night and started pulling at his tubes and required sedation and is sleepy and not following commands this morning. Exam is limited due to his mental status.  He spontaneously moves the right side.  He does withdraw the left arm and leg but not to the same degree as the right. Unable to obtain MRI BRAN. Due to  pacing wires  O:// Current vital signs: BP 114/60 (BP Location: Left Arm)   Pulse 70   Temp 98.1 F (36.7 C) (Axillary)   Resp 19   Ht 6' (1.829 m)   Wt 92.7 kg   SpO2 99%   BMI 27.72 kg/m  Vital signs in last 24 hours: Temp:  [98.1 F (36.7 C)-99.1 F (37.3 C)] 98.1 F (36.7 C) (11/08 0400) Pulse Rate:  [52-83] 70 (11/08 1215) Resp:  [12-37] 19 (11/08 1215) BP: (82-161)/(48-96) 114/60 (11/08 1215) SpO2:  [96 %-100 %] 99 % (11/08 1215) Arterial Line BP: (109-199)/(43-108) 151/58 (11/08 0900) FiO2 (%):  [40 %] 40 % (11/08 1215) Weight:  [92.7 kg] 92.7 kg (11/08 0500)  NEURO:  Patient is sedated on propofol and intubated  Eyes are closed.  Opens eyes to voice and noxious stimulation. He grimaced to noxious stimulation Does not follow commands Pupils equal round and reactive,Ocular cephalic reflex intact Face appears symmetrical Positive cough/gag reflex Motor: Moves all extremity spontaneously but. Left side much less then right side  Withdraws lower extremities to noxious stimulation Toes are upgoing  Gait : not tested    Medications  Current Facility-Administered Medications:    0.9 %  sodium chloride infusion, 250 mL, Intravenous, Continuous, Roddenberry, Myron G, PA-C   0.9 %  sodium chloride infusion, , Intravenous, PRN, [name], MD, Last Rate: 10 mL/hr at 09/21/22 1200, Infusion Verify at 09/21/22 1200   acetaminophen (TYLENOL) tablet 1,000 mg, 1,000 mg, Oral, Q6H, 1,000 mg at 09/21/22 0534 **OR** acetaminophen (TYLENOL) 160 MG/5ML  solution 1,000 mg, 1,000 mg, Per Tube, Q6H, Roddenberry, Myron G, PA-C, 1,000 mg at 09/20/22 1732   acetaminophen (TYLENOL) 160 MG/5ML solution 650 mg, 650 mg, Per Tube, Once **OR** acetaminophen (TYLENOL) suppository 650 mg, 650 mg, Rectal, Once, Roddenberry, Myron G, PA-C   amiodarone (NEXTERONE) 1.8 mg/mL load via infusion 150 mg, 150 mg, Intravenous, Once **FOLLOWED BY** [EXPIRED] amiodarone (NEXTERONE PREMIX) 360-4.14 MG/200ML-% (1.8 mg/mL) IV infusion, 60 mg/hr, Intravenous, Continuous, Stopped at 09/18/22 0340 **FOLLOWED BY** amiodarone (NEXTERONE PREMIX) 360-4.14 MG/200ML-% (1.8 mg/mL) IV infusion, 30 mg/hr, Intravenous, Continuous, Roddenberry, Myron G, PA-C, Last Rate: 16.67 mL/hr at 09/21/22 1200, 30 mg/hr at 09/21/22 1200   bisacodyl (DULCOLAX) EC tablet 10 mg, 10 mg, Oral, Daily **OR** bisacodyl (DULCOLAX) suppository 10 mg, 10 mg, Rectal, Daily, Roddenberry, Myron G, PA-C, 10 mg at 09/20/22 0944   Chlorhexidine Gluconate Cloth 2 % PADS 6 each, 6 each, Topical, Daily, [name], MD, 6 each at 09/21/22 0945   clonazePAM (KLONOPIN) disintegrating tablet 0.25 mg, 0.25 mg, Per Tube, BID, Noemi Chapel P, DO, 0.25 mg at 09/21/22 1029   dexmedetomidine (PRECEDEX) 400 MCG/100ML (4 mcg/mL) infusion, 0-0.7 mcg/kg/hr, Intravenous, Continuous, Roddenberry, Myron G, PA-C, Paused at 09/21/22 1053   docusate (COLACE) 50 MG/5ML liquid 200 mg, 200 mg, Per Tube, Daily, Roddenberry, Myron G, PA-C, 200 mg at 09/20/22 0944   famotidine (PEPCID) tablet 20 mg, 20 mg, Per Tube, Daily, [name], MD, 20 mg  at 09/21/22 0938   feeding supplement (PROSource TF20) liquid 60 mL, 60 mL, Per Tube, QID, [name], MD, 60 mL at 09/21/22 [id number]   feeding supplement (VITAL 1.5 CAL) liquid 1,000 mL, 1,000 mL, Per Tube, Continuous, [name], MD   fentaNYL (SUBLIMAZE) injection 25-50 mcg, 25-50 mcg, Intravenous, Q2H PRN, Noemi Chapel P, DO   furosemide (LASIX) 200 mg in dextrose 5 %  100 mL (2 mg/mL) infusion, 4 mg/hr, Intravenous, Continuous, [name], MD, Last Rate: 2 mL/hr at 09/21/22 1200, 4 mg/hr at 09/21/22 1200   insulin aspart (novoLOG) injection 0-24 Units, 0-24 Units, Subcutaneous, Q4H, [name], MD, 2 Units at 09/21/22 0847   insulin detemir (LEVEMIR) injection 12 Units, 12 Units, Subcutaneous, BID, [name], MD, 12 Units at 09/21/22 1029   lactated ringers infusion, , Intravenous, Continuous, Roddenberry, Myron G, PA-C   lactated ringers infusion, , Intravenous, Continuous, Roddenberry, Myron G, PA-C, Stopped at 09/20/22 1403   levalbuterol (XOPENEX) nebulizer solution 0.63 mg, 0.63 mg, Nebulization, TID, [name], MD, 0.63 mg at 09/21/22 0817   metoprolol tartrate (LOPRESSOR) tablet 12.5 mg, 12.5 mg, Oral, BID, 12.5 mg at 09/19/22 2219 **OR** metoprolol tartrate (LOPRESSOR) 25 mg/10 mL oral suspension 12.5 mg, 12.5 mg, Per Tube, BID, Roddenberry, Myron G, PA-C, 12.5 mg at 09/20/22 0942   metoprolol tartrate (LOPRESSOR) injection 2.5-5 mg, 2.5-5 mg, Intravenous, Q2H PRN, Roddenberry, Myron G, PA-C   mupirocin ointment (BACTROBAN) 2 % 1 Application, 1 Application, Nasal, BID, [name], MD, 1 Application at [id number]   nicardipine (CARDENE) 20mg  in 0.86% saline 217ml IV infusion (0.1 mg/ml), 3-15 mg/hr, Intravenous, Continuous, [name], MD, Stopped at 09/19/22 1647   ondansetron (ZOFRAN) injection 4 mg, 4 mg, Intravenous, Q6H PRN, Roddenberry, Myron G, PA-C   Oral care mouth rinse, 15 mL, Mouth Rinse, Q2H, [name], MD, 15 mL at 09/21/22 1000   Oral care mouth rinse, 15 mL, Mouth Rinse, PRN, [name], MD   oxyCODONE (Oxy IR/ROXICODONE) immediate release tablet 5 mg, 5 mg, Per Tube, Q6H, Noemi Chapel P, DO   oxyCODONE (Oxy IR/ROXICODONE) immediate release tablet 5-10 mg, 5-10 mg, Oral, Q3H PRN, Roddenberry, Myron G, PA-C   phenylephrine (NEO-SYNEPHRINE) 20mg /NS  268mL premix infusion, 0-100 mcg/min, Intravenous, Titrated, Gaye Pollack, MD, Stopped at 09/20/22 0735   QUEtiapine (SEROQUEL) tablet 25 mg, 25 mg, Per Tube, QHS, Noemi Chapel P, DO   sodium chloride flush (NS) 0.9 % injection 10-40 mL, 10-40 mL, Intracatheter, Q12H, [name], MD, 10 mL at 09/19/22 2220   sodium chloride flush (NS) 0.9 % injection 10-40 mL, 10-40 mL, Intracatheter, PRN, [name], MD   sodium chloride flush (NS) 0.9 % injection 3 mL, 3 mL, Intravenous, Q12H, Roddenberry, Myron G, PA-C, 3 mL at 09/21/22 0943   sodium chloride flush (NS) 0.9 % injection 3 mL, 3 mL, Intravenous, PRN, Roddenberry, Myron G, PA-C   sodium chloride HYPERTONIC 3 % nebulizer solution 4 mL, 4 mL, Nebulization, TID, Carlis Abbott, Laura P, DO   traMADol (ULTRAM) tablet 50-100 mg, 50-100 mg, Oral, Q4H PRN, Mal[facility] CBC    Component Value Date/Time   WBC 10.4 09/21/2022 0522   RBC 2.66 (L) 09/21/2022 0522   HGB 7.9 (L) 09/21/2022 0522   HGB 13.8 05/28/2020 0819   HCT 23.0 (L) 09/21/2022 0522   HCT 41.5 05/28/2020 0819   PLT 50 (L) 09/21/2022 0522   PLT 151 05/28/2020  0819   MCV 86.5 09/21/2022 0522   MCV 84 05/28/2020 0819   MCH 29.7 09/21/2022 0522   MCHC 34.3 09/21/2022 0522   RDW 15.4 09/21/2022 0522   RDW 12.8 05/28/2020 0819   LYMPHSABS 2.3 09/17/2022 1722   LYMPHSABS 1.7 05/28/2020 0819   MONOABS 1.1 (H) 09/17/2022 1722   EOSABS 0.2 09/17/2022 1722   EOSABS 0.2 05/28/2020 0819   BASOSABS 0.1 09/17/2022 1722   BASOSABS 0.1 05/28/2020 0819    CMP     Component Value Date/Time   NA 143 09/21/2022 0522   K 3.1 (L) 09/21/2022 0522   CL 106 09/21/2022 0522   CO2 28 09/21/2022 0522   GLUCOSE 147 (H) 09/21/2022 0522   BUN 43 (H) 09/21/2022 0522   CREATININE 1.99 (H) 09/21/2022 0522   CALCIUM 7.5 (L) 09/21/2022 0522   PROT 4.7 (L) 09/21/2022 0522   PROT 6.3 05/28/2020 0819   ALBUMIN 2.6 (L) 09/21/2022 0522   ALBUMIN 3.8 05/28/2020 0819    AST 22 09/21/2022 0522   ALT 9 09/21/2022 0522   ALKPHOS 32 (L) 09/21/2022 0522   BILITOT 0.9 09/21/2022 0522   BILITOT 0.6 05/28/2020 0819   GFRNONAA 34 (L) 09/21/2022 0522   GFRAA >60 05/05/2020 0430    glycosylated hemoglobin  Lipid Panel     Component Value Date/Time   CHOL 82 09/19/2022 0528   CHOL 146 05/28/2020 0819   TRIG 92 09/19/2022 0528   HDL 22 (L) 09/19/2022 0528   HDL 43 05/28/2020 0819   CHOLHDL 3.7 09/19/2022 0528   VLDL 18 09/19/2022 0528   LDLCALC 42 09/19/2022 0528   LDLCALC 87 05/28/2020 0819     Imaging I have reviewed images in epic and the results pertinent to this consultation are:  CT-scan of the brain COMPARISON:  09/19/2022 10:44 a.m.   FINDINGS: Brain: Redemonstrated hypodensity in the right MCA territory, including foci in the right precentral gyrus (series 3, image 30) and left caudate/corona radiata (series 3, image 23), as well as possibly the right cerebellum (series 3, image 10). Redemonstrated focus of hemorrhage associated with an anterior right frontal lobe infarct (series 3, image 23). No evidence of increasing intraparenchymal hemorrhage. No evidence of additional acute infarct, mass, mass effect, or midline shift. No hydrocephalus or extra-axial fluid collection.   Vascular: No definite hyperdense vessel.   Skull: Normal. Negative for fracture or focal lesion.   Sinuses/Orbits: No acute finding. Status post bilateral lens replacements.   Other: The mastoid air cells are well aerated.   IMPRESSION: Redemonstrated infarcts in the right MCA territory, including foci in the right precentral gyrus and left caudate/corona radiata, as well as a hemorrhagic focus in the anterior right frontal lobe. No evidence of increasing intraparenchymal hemorrhage.  CTA Head/Neck: 09/19/22  FINDINGS: CTA NECK FINDINGS   Aortic arch: The aortic arch, including the arch vessel origins, was incompletely imaged. The previously shown  aortic dissection is partially covered in the distal aspect of the arch. The dissection is again noted to extend into the included portion of the brachiocephalic artery and proximal right subclavian artery, with the false lumen now being opacified as opposed to having a thrombosed appearance on the prior CTA and with decreased narrowing of the true lumen. The dissection flap also slightly extends into the origin of the right common carotid artery.   Right carotid system: Patent without evidence of stenosis. Minimal atheromatous irregularity.   Left carotid system: Patent without evidence of stenosis or dissection, although the  common carotid origin was not imaged. Minimal atheromatous irregularity.   Vertebral arteries: Patent without evidence of a significant stenosis or dissection within limitations of dental streak artifact.   Skeleton: Mild cervical spondylosis.   Other neck: Thyroidectomy.  Right internal jugular venous catheter.   Upper chest: Partially visualized right larger than left pleural effusions with compressive atelectasis. Endotracheal tube terminating at the level of the clavicular heads. Partially visualized enteric tube.   Review of the MIP images confirms the above findings   CTA HEAD FINDINGS   Anterior circulation: The internal carotid arteries are patent from skull base to carotid termini with mild atherosclerotic plaque bilaterally not resulting in significant stenosis. There are broad-based 2 mm aneurysm is projecting laterally from the left cavernous ICA and right supraclinoid ICA. ACAs and MCAs are patent without evidence of a proximal branch occlusion or significant proximal stenosis. Mild generalized ectatic appearance of the intracranial ICAs, ACAs, and MCAs diffusely.   Posterior circulation: The intracranial vertebral arteries are patent to the basilar. Patent PICA, AICA, and SCA origins are seen bilaterally. The basilar artery is widely  patent. There is a large left posterior communicating artery with absence of the left P1 segment. Both PCAs are patent without evidence of a significant proximal stenosis. No aneurysm is identified.   Venous sinuses: Patent.   Anatomic variants: Fetal left PCA.   Review of the MIP images confirms the above findings   IMPRESSION: 1. No large vessel occlusion. 2. Partially visualized known aortic dissection extending into the brachiocephalic artery, proximal right subclavian artery, and origin of the right common carotid artery. 3. Mild intracranial and cervical carotid atherosclerosis without significant proximal stenosis. 4. Small bilateral ICA aneurysms. 5. Bilateral pleural effusions.  EEG: 09/20/22 Date: 09/19/2022 Duration: 24.24 mins   Patient history: 81 year old male with right gaze deviation left-sided weakness.  EEG to evaluate for seizure.   Level of alertness: lethargic    AEDs during EEG study: None   Technical aspects: This EEG study was done with scalp electrodes positioned according to the 10-20 International system of electrode placement. Electrical activity was reviewed with band pass filter of 1-70Hz , sensitivity of 7 uV/mm, display speed of 62mm/sec with a 60Hz  notched filter applied as appropriate. EEG data were recorded continuously and digitally stored.  Video monitoring was available and reviewed as appropriate.   Description: EEG showed continuous generalized predominantly 5-9Hz  theta-alpha activity admixed with intermittent generalized 2-3Hz  delta slowing. Hyperventilation and photic stimulation were not performed.      ABNORMALITY - Continuous slow, generalized   IMPRESSION: This study is suggestive of moderate to severe diffuse encephalopathy, nonspecific etiology. No seizures or epileptiform discharges were seen throughout the recording.  Assessment:  81 year old male with a history of AAA s/p repair, presenting with acute onset of severe left  worse than RLE weakness in conjunction with left flank/back pain resulting in collapse at home. STAT CT head on presentation was with no acute abnormality. 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 Patient is still intubated  and current stroke is secondary to current cardiac surgery. 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 Perioperative  s/p aortic arch repair  Recommendations:  Neuro checks per unit protocol EEG completed this am  Stat CT HEAD with any acute neurological changes Hold Antiplatelet  and anticoagulation MRI Brain needed to assess additional strokes not seen on CT SCAN . Its held at this timr secondary to epicardial pacing wires. This can be arrangedt later . Did discuss with patients family  -- Patient developed leg weakness following aortic arch repair for dissection and MRI scan shows multiple small embolic infarcts likely periprocedural post aortic repair.  He also had transient postop A-fib and rate is presently controlled on amiodarone drip. Recommend aspirin for stroke prevention if safe from surgical standpoint and after thrombocytopenia improves.  EEG shows generalized slowing without any epileptiform activity.     Aggressive risk factor modification.  MRI not possible due to pacing leads.  Extubate as tolerated per cardiothoracic surgery and critical care team.  Patient's prognosis from neurological standpoint seems pretty good as his exam seems pretty much nonfocal with only mild left-sided weakness.  Long discussion with patient's   daughter at the bedside and answered questions.  This patient is critically ill and at significant risk of neurological worsening, death and care requires constant monitoring of vital signs,  hemodynamics,respiratory and cardiac monitoring, extensive review of multiple databases, frequent neurological assessment, discussion with family, other specialists and medical decision making of high complexity.I have made any additions or clarifications directly to the above note.This critical care time does not reflect procedure time, or teaching time or supervisory time of PA/NP/Med Resident etc but could involve care discussion time.  I spent 30 minutes of neurocritical care time  in the care of  this patient.     Delia Heady, MD Medical Director [facility] Pager: [phone] 09/21/2022 12:46 PM

## 2022-09-21 NOTE — Progress Notes (Addendum)
Pt. attemping to pull out endotracheal tube with family presence at bedside. Reorientation, medicinal efforts, and diversion activities offered. Per Dr. Leafy Ro during evening rounds, verbal OK given to apply bilateral non-violent wrist restraints for agitation/restlessness. Bilateral wrist restraints applied at this time while family at bedside.  Length of Order: Daily Less Restrictive Interventions Attempted: Yes Health history reviewed prior to applying restraint: Yes Clinical Justification: Pulling lines/tubes, removal of equipment  Plan to Progress Out: Family participation, reality orientation, continue safety plan, interdisciplinary collaboration Discontinuation Criteria: No longer interfering with care Discontinuation Criteria Explained: Yes Family Notification: Family at bedside (son) Airway Clear with Spontaneous Respirations: Yes, on ventilator Circulation/Skin integrity: No signs of injury Emotional/Mental status" Agitated/restless, confused Range of Motion: Performed Food and Fluids: Tube feeding/TPN Elimination: Urethral catheter Patient's right, dignity, safety maintained: Yes Can Restraints be Less Restrictive or Discontinued?: No Skin Assessed Under All Medical Devices Prophylactic Dressings: Yes Soft Restraint Right Wrist: START/continued Soft Restraint Left Wrist: START Less Restrictive Interventions: Diversional activities, encourage family presence, pain/anxiety medication, reorient patient  Diversional activities: Music/Relaxation

## 2022-09-21 NOTE — Progress Notes (Signed)
Nutrition Follow-up  DOCUMENTATION CODES:   Not applicable  INTERVENTION:   Tube Feeding via OG:  Goal Regimen: Peptamen 1.5/Vital 1.5 at 55 ml/hr Increase to 30 ml/hr, titrate by 10 mL q 8 hours until goal rate of 55 ml/hr Pro-Source TF20 60 mL BID This provides 2140 kcals, 130 g of protein and 1003 mL of free water   NUTRITION DIAGNOSIS:   Inadequate oral intake related to acute illness as evidenced by NPO status.  Being addressed via TF   GOAL:   Patient will meet greater than or equal to 90% of their needs  Progressing  MONITOR:   Labs, Weight trends, Vent status  REASON FOR ASSESSMENT:   Ventilator    ASSESSMENT:   81 yo male admitted with type 1 aortic dissection requiring emergent repair. PMH includes HTN, HLD, bradycardia, AAA s/p stent graft  11/04 Type 1 aortic dissection to OR for repair,  massive EBL requiring multiple transfusions 11/06 CT head with 3 small right strokes, 1 with 35mm area of bleeding, neurology consulted  Pt remains on vent support, unable to wean from vent today.   Tolerating Vital/Peptamen 1.5 at 20 ml/hr  +BM, type 7, pt currently on scheduled bowel regimen. Need to monitor and adjust bowel regimen pending stool consistency and frequency  Remains on lasix gtt. Potassium 3.1, receiving supplementation   Labs: reviewed Meds: reviewed   Diet Order:   Diet Order     None       EDUCATION NEEDS:   Not appropriate for education at this time  Skin:  Skin Assessment: Skin Integrity Issues: Skin Integrity Issues:: Incisions, Other (Comment) Incisions: sternum, groin Other: laceration to lip  Last BM:  11/8  Height:   Ht Readings from Last 1 Encounters:  09/18/22 6' (1.829 m)    Weight:   Wt Readings from Last 1 Encounters:  09/21/22 92.7 kg     BMI:  Body mass index is 27.72 kg/m.  Estimated Nutritional Needs:   Kcal:  2000-2200 kcals  Protein:  115-130 g  Fluid:  1.8 L    Romelle Starcher MS, RDN,  LDN, CNSC Registered Dietitian 3 Clinical Nutrition RD Pager and On-Call Pager Number Located in [place]

## 2022-09-21 NOTE — Consult Note (Signed)
NAME:  James Estrada, MRN:  [id number], DOB:  1941-10-09, LOS: 4 ADMISSION DATE:  09/17/2022, CONSULTATION DATE:  11/8 REFERRING MD:  Bensimhon, CHIEF COMPLAINT:  encephalpathy, respiratory failure   History of Present Illness:  James Estrada is a 81 y/o gentleman with a history of AAA, GERD, HLD, HTN who presented with acute onset of weakness and pain across his chest and upper back that caused him to collapse on 11/4.  In the ED a code stroke was called.  He was was found to have an acute ascending aortic dissection with thrombosis of the false lumen with extension into the brachiocephalic artery with narrowing and into the proximal carotid artery.  CT scan also noted left retroperitoneal hemorrhage from the iliics to the left kidney and decreased perfusion to the left kidney.  He underwent emergency dissection repair with vascular graft on 11/4.  He was found to have embolic strokes postoperatively.  He continues to require mechanical ventilation due to encephalopathy and agitation. At baseline he is very active, walking every day.  PCCM was consulted for assistance with ventilator weaning.  Pertinent  Medical History  AAA status post endovascular repair in 2020 Hypertension Hyperlipidemia GERD Cataract extraction bilaterally  Significant Hospital Events: Including procedures, antibiotic start and stop dates in addition to other pertinent events   11/4 Ascending aortic dissection, s/p graft repair on bypass   Interim History / Subjective:    Objective   Blood pressure (!) 101/58, pulse (!) 58, temperature 98.1 F (36.7 C), temperature source Axillary, resp. rate (!) 24, height 6' (1.829 m), weight 92.7 kg, SpO2 99 %.    Vent Mode: CPAP;PSV FiO2 (%):  [40 %] 40 % Set Rate:  [12 bmp] 12 bmp Vt Set:  [620 mL] 620 mL PEEP:  [5 cmH20] 5 cmH20 Pressure Support:  [5 cmH20-10 cmH20] 5 cmH20   Intake/Output Summary (Last 24 hours) at 09/21/2022 0940 Last data filed at 09/21/2022  0900 Gross per 24 hour  Intake 1452.78 ml  Output 4025 ml  Net -2572.22 ml   Filed Weights   09/19/22 0500 09/20/22 0448 09/21/22 0500  Weight: 95.5 kg 92.9 kg 92.7 kg    Examination: General: Critically ill-appearing man lying in bed in no acute distress intubated, sedated on Precedex HENT: [place]/AT, eyes anicteric, endotracheal tube in place Lungs: Decreased R basilar breath sounds, CTA anteriorly.  Small volume of thick mucus.  Difficulty advancing [place] through the past few centimeters of his endotracheal tube. Cardiovascular: S1-S2, regular rate and rhythm Abdomen: Soft, nontender, nondistended Extremities: Pedal edema, no cyanosis Neuro: RASS -5 GU: foley with amber urine Derm: warm, dry, no erythema around RIJ   K+ 3.1 BUN 43 Cr 1.99 WBC 10.4 H/H 7.9/23 Platelets 50  [place] chest: pleural effusion, small adhesion  EKG: sinus brady, normal axis, normal intervals. Qtc 435. TWI in III, AVF> new since 11/5. CXR personally reviewed-right layering pleural effusion.  Right IJ CVC terminating in SVC.  Resolved Hospital Problem list     Assessment & Plan:  Acute ascending aortic dissection s/p graft repair Pericardial effusion -Postop care per cardiothoracic surgery -Changing postop pain control meds given AKI - Postop pacing wires, chest tubes with cardiothoracic surgery. -Cardiology planning TEE later in the week  Postop paroxysmal atrial fibrillation Acute HFpEF Periodic hypertension with agitation - Continue amiodarone -Diuresis - Telemetry monitoring  Acute respiratory failure with hypoxia requiring MV Mucus plugging R pleural effusion -con't diuresis -Adding hypertonic saline nebs. - LTVV - VAP prevention protocol -  Continue daily SAT and SBT.  Would like to optimize agitation as he mechanically appears to tolerate well for breathing.  Current RASS of -5 precludes extubation.  AKI, improving Left renal artery narrowing demonstrated on CT -change morphine  to fentanyl PRN -Continue oxycodone as needed - Strict I/O - Renally dose meds and avoid nephrotoxic meds - Maintain adequate renal perfusion.  Embolic strokes complicating aortic dissection repair -Appreciate stroke team's management. -Will require PT, OT, SLP  Hypokalemia -Repleted - Continue to monitor  Hyperglycemia -Continue detemir 12 units twice daily plus sliding scale insulin - Hold blood glucose less than 180  Thrombocytopenia, consumptive Acute blood loss anemia -Monitor for bleeding - Transfuse for hemoglobin less than 7 or hemodynamically significant bleeding.  Daughter, daughter-in-law, & wife updated throughout the day today.  Best Practice (right click and "Reselect all SmartList Selections" daily)   Diet/type: tubefeeds DVT prophylaxis: SCD GI prophylaxis: H2B Lines: Central line, Arterial Line, and yes and it is still needed Foley:  Yes, and it is still needed Code Status:  full code Last date of multidisciplinary goals of care discussion [full scope]  Labs   CBC: Recent Labs  Lab 09/17/22 1722 09/17/22 1743 09/18/22 2057 09/19/22 0528 09/19/22 1507 09/20/22 0400 09/20/22 0830 09/21/22 0522  WBC 13.3*   < > 19.6* 16.0* 20.1* 14.8*  --  10.4  NEUTROABS 9.5*  --   --   --   --   --   --   --   HGB 13.8   < > 7.6* 7.1* 7.8* 8.2* 8.2* 7.9*  HCT 43.0   < > 22.4* 21.1* 22.8* 23.6* 24.0* 23.0*  MCV 87.6   < > 84.5 84.7 84.8 85.2  --  86.5  PLT 115*   < > 68* 57* 58* 51*  --  50*   < > = values in this interval not displayed.    Basic Metabolic Panel: Recent Labs  Lab 09/19/22 0528 09/19/22 1507 09/20/22 0400 09/20/22 0830 09/20/22 1310 09/20/22 1806 09/21/22 0522  NA 140 141 142 141  --  142 143  K 3.8 3.4* 3.4* 3.9  --  3.3* 3.1*  CL 111 106 106  --   --  105 106  CO2 22 23 25   --   --  25 28  GLUCOSE 118* 158* 151*  --   --  175* 147*  BUN 27* 29* 30*  --   --  37* 43*  CREATININE 1.84* 2.11* 2.15*  --   --  2.23* 1.99*  CALCIUM  7.7* 7.8* 7.4*  --   --  7.4* 7.5*  MG 2.7* 2.5* 2.4  --  2.3  --  2.5*  PHOS  --   --   --   --  4.1  --  3.3   GFR: Estimated Creatinine Clearance: 33.6 mL/min (A) (by C-G formula based on SCr of 1.99 mg/dL (H)). Recent Labs  Lab 09/19/22 0528 09/19/22 1507 09/20/22 0400 09/21/22 0522  WBC 16.0* 20.1* 14.8* 10.4    Liver Function Tests: Recent Labs  Lab 09/17/22 1722 09/21/22 0522  AST 20 22  ALT 13 9  ALKPHOS 45 32*  BILITOT 0.8 0.9  PROT 5.6* 4.7*  ALBUMIN 3.4* 2.6*   No results for input(s): "LIPASE", "AMYLASE" in the last 168 hours. No results for input(s): "AMMONIA" in the last 168 hours.  ABG    Component Value Date/Time   PHART 7.479 (H) 09/20/2022 0830   PCO2ART 34.0 09/20/2022 0830  PO2ART 66 (L) 09/20/2022 0830   HCO3 25.2 09/20/2022 0830   TCO2 26 09/20/2022 0830   ACIDBASEDEF 1.0 09/18/2022 1629   O2SAT 94 09/20/2022 0830     Coagulation Profile: Recent Labs  Lab 09/17/22 1722 09/18/22 0220  INR 1.2 1.1    Cardiac Enzymes: No results for input(s): "CKTOTAL", "CKMB", "CKMBINDEX", "TROPONINI" in the last 168 hours.  HbA1C: Hgb A1c MFr Bld  Date/Time Value Ref Range Status  09/19/2022 03:07 PM 5.5 4.8 - 5.6 % Final    Comment:    (NOTE) Pre diabetes:          5.7%-6.4%  Diabetes:              >6.4%  Glycemic control for   <7.0% adults with diabetes     CBG: Recent Labs  Lab 09/20/22 0741 09/20/22 1112 09/20/22 1535 09/20/22 1915 09/21/22 0838  GLUCAP 142* 155* 132* 155* 128*    Review of Systems:   Unable to be obtained due to mental status  Past Medical History:  He,  has a past medical history of AAA (abdominal aortic aneurysm) ([place]), Arthritis, Bradycardia, Dysrhythmia, GERD (gastroesophageal reflux disease), Hemorrhoids, History of hiatal hernia, Hyperlipidemia, Hypertension, and Seasonal allergies.   Surgical History:   Past Surgical History:  Procedure Laterality Date   ABDOMINAL AORTIC ENDOVASCULAR STENT GRAFT  N/A 05/04/2020   Procedure: ABDOMINAL AORTIC ENDOVASCULAR STENT GRAFT;  Surgeon: [name], MD;  Location: [facility];  Service: Vascular;  Laterality: N/A;   CATARACT EXTRACTION Bilateral 2009   DIAGNOSTIC LAPAROSCOPY     laparoscopic hernia repair   EYE SURGERY Bilateral    cataract removal   HERNIA REPAIR Bilateral 1999, 2006   JOINT REPLACEMENT Right ~2018   hip replacement   pheochromocytoma  1993   PROSTATECTOMY N/A 05/15/2013   Procedure: PROSTATECTOMY RETROPUBIC; SIMPLE OPEN PROSTATECTOMY;  Surgeon: [name], MD;  Location: [facility];  Service: Urology;  Laterality: N/A;   TOTAL ELBOW REPLACEMENT Left    > 30 years ago   TOTAL HIP ARTHROPLASTY Right 09/08/2017   Procedure: RIGHT TOTAL HIP ARTHROPLASTY ANTERIOR APPROACH;  Surgeon: [name], MD;  Location: [facility];  Service: Orthopedics;  Laterality: Right;   ULTRASOUND GUIDANCE FOR VASCULAR ACCESS Bilateral 05/04/2020   Procedure: ULTRASOUND GUIDANCE FOR VASCULAR ACCESS;  Surgeon: [name], MD;  Location: [facility];  Service: Vascular;  Laterality: Bilateral;     Social History:   reports that he has never smoked. He has never used smokeless tobacco. He reports current alcohol use. He reports that he does not use drugs.   Family History:  His family history includes Aneurysm in his father; Coronary artery disease in his mother; Diabetes in his sister; Heart disease in his mother.   Allergies No Known Allergies   Home Medications  Prior to Admission medications   Not on File     Critical care time: 60 min       Julian Hy, DO 09/21/22 2:42 PM [facility]

## 2022-09-21 NOTE — Progress Notes (Addendum)
Advanced Heart Failure Rounding Note  PCP-Cardiologist: None   Subjective:    POD#4  Agitated overnight and pulled ETT. RT repositioned. Now w/ hand restraints.   Off NEO. BP variable, hypertensive at times w/ agitation.   Remains intubated. C/w mild left hemiparesis. Intermittently follows commands.   Head CT 11/6 showed small  acute cortical/subcortical infarcts within the right MCA vascular territory. 4 mm focus of hemorrhage associated with an infarct within the anterior right frontal lobe.   On lasix gtt at 4/hr. Diuresing well. 3.5 L in UOP yesterday.  Wt unchanged. SCr 1.84>>2.11>>2.15>>2.23>>1.99  K 3.1  On amio gtt 30/hr. NSR 70s.   WBC trending down 20>>14>>10K  Hgb 7.1>>7.8>>8.2>>7.9  Plts 51>>50K   Objective:   Weight Range: 92.9 kg Body mass index is 27.78 kg/m.   Vital Signs:   Temp:  [97.5 F (36.4 C)-98.8 F (37.1 C)] 98.4 F (36.9 C) (11/07 0400) Pulse Rate:  [79-81] 80 (11/07 0700) Resp:  [12-25] 16 (11/07 0700) BP: (74-144)/(48-68) 121/68 (11/07 0700) SpO2:  [87 %-100 %] 96 % (11/07 0715) Arterial Line BP: (100-157)/(33-62) 149/50 (11/07 0700) FiO2 (%):  [30 %-40 %] 30 % (11/07 0715) Weight:  [92.9 kg] 92.9 kg (11/07 0448) Last BM Date :  (PTA)  Weight change: Filed Weights   09/17/22 1843 09/19/22 0500 09/20/22 0448  Weight: 83 kg 95.5 kg 92.9 kg    Intake/Output:   Intake/Output Summary (Last 24 hours) at 09/20/2022 0737 Last data filed at 09/20/2022 0700 Gross per 24 hour  Intake 3138.93 ml  Output 4965 ml  Net -1826.07 ml      Physical Exam    General: intubated. No distress  HEENT: Normal + ETT  Neck: Supple. JVP not well visualized . Carotids 2+ bilat; no bruits. No lymphadenopathy or thyromegaly appreciated. Cor: PMI nondisplaced. Regular rate & rhythm. No rubs, gallops or murmurs. + CTs Lungs: intubated and clear Abdomen: Soft, nontender, nondistended. No hepatosplenomegaly. No bruits or masses. Good bowel  sounds. Extremities: No cyanosis, clubbing, rash, trace b/l LE edema GU: + foley  Neuro: intubated, + mild left hemiparesis, some agitation    Telemetry   NSR 70s   EKG    No new EKG to review   Labs    CBC Recent Labs    09/17/22 1722 09/17/22 1743 09/19/22 1507 09/20/22 0400  WBC 13.3*   < > 20.1* 14.8*  NEUTROABS 9.5*  --   --   --   HGB 13.8   < > 7.8* 8.2*  HCT 43.0   < > 22.8* 23.6*  MCV 87.6   < > 84.8 85.2  PLT 115*   < > 58* 51*   < > = values in this interval not displayed.   Basic Metabolic Panel Recent Labs    40/98/1110/05/06 1507 09/20/22 0400  NA 141 142  K 3.4* 3.4*  CL 106 106  CO2 23 25  GLUCOSE 158* 151*  BUN 29* 30*  CREATININE 2.11* 2.15*  CALCIUM 7.8* 7.4*  MG 2.5* 2.4   Liver Function Tests Recent Labs    09/17/22 1722  AST 20  ALT 13  ALKPHOS 45  BILITOT 0.8  PROT 5.6*  ALBUMIN 3.4*   No results for input(s): "LIPASE", "AMYLASE" in the last 72 hours. Cardiac Enzymes No results for input(s): "CKTOTAL", "CKMB", "CKMBINDEX", "TROPONINI" in the last 72 hours.  BNP: BNP (last 3 results) No results for input(s): "BNP" in the last 8760 hours.  ProBNP (last 3  results) No results for input(s): "PROBNP" in the last 8760 hours.   D-Dimer Recent Labs    09/18/22 0220  DDIMER >20.00*   Hemoglobin A1C Recent Labs    09/19/22 1507  HGBA1C 5.5   Fasting Lipid Panel Recent Labs    09/19/22 0528  CHOL 82  HDL 22*  LDLCALC 42  TRIG 92  CHOLHDL 3.7   Thyroid Function Tests No results for input(s): "TSH", "T4TOTAL", "T3FREE", "THYROIDAB" in the last 72 hours.  Invalid input(s): "FREET3"  Other results:   Imaging    CT HEAD WO CONTRAST (5MM)  Result Date: 09/19/2022 CLINICAL DATA:  Stroke, follow-up EXAM: CT HEAD WITHOUT CONTRAST TECHNIQUE: Contiguous axial images were obtained from the base of the skull through the vertex without intravenous contrast. RADIATION DOSE REDUCTION: This exam was performed according to the  departmental dose-optimization program which includes automated exposure control, adjustment of the mA and/or kV according to patient size and/or use of iterative reconstruction technique. COMPARISON:  09/19/2022 10:44 a.m. FINDINGS: Brain: Redemonstrated hypodensity in the right MCA territory, including foci in the right precentral gyrus (series 3, image 30) and left caudate/corona radiata (series 3, image 23), as well as possibly the right cerebellum (series 3, image 10). Redemonstrated focus of hemorrhage associated with an anterior right frontal lobe infarct (series 3, image 23). No evidence of increasing intraparenchymal hemorrhage. No evidence of additional acute infarct, mass, mass effect, or midline shift. No hydrocephalus or extra-axial fluid collection. Vascular: No definite hyperdense vessel. Skull: Normal. Negative for fracture or focal lesion. Sinuses/Orbits: No acute finding. Status post bilateral lens replacements. Other: The mastoid air cells are well aerated. IMPRESSION: Redemonstrated infarcts in the right MCA territory, including foci in the right precentral gyrus and left caudate/corona radiata, as well as a hemorrhagic focus in the anterior right frontal lobe. No evidence of increasing intraparenchymal hemorrhage. Electronically Signed   By: Merilyn Baba M.D.   On: 09/19/2022 21:35   CT ANGIO HEAD NECK W WO CM  Result Date: 09/19/2022 CLINICAL DATA:  Neuro deficit, acute, stroke suspected. Left-sided weakness. Known type A aortic dissection. EXAM: CT ANGIOGRAPHY HEAD AND NECK TECHNIQUE: Multidetector CT imaging of the head and neck was performed using the standard protocol during bolus administration of intravenous contrast. Multiplanar CT image reconstructions and MIPs were obtained to evaluate the vascular anatomy. Carotid stenosis measurements (when applicable) are obtained utilizing NASCET criteria, using the distal internal carotid diameter as the denominator. RADIATION DOSE REDUCTION:  This exam was performed according to the departmental dose-optimization program which includes automated exposure control, adjustment of the mA and/or kV according to patient size and/or use of iterative reconstruction technique. CONTRAST:  74mL OMNIPAQUE IOHEXOL 350 MG/ML SOLN COMPARISON:  CTA chest, abdomen, and pelvis 09/17/2022 FINDINGS: CTA NECK FINDINGS Aortic arch: The aortic arch, including the arch vessel origins, was incompletely imaged. The previously shown aortic dissection is partially covered in the distal aspect of the arch. The dissection is again noted to extend into the included portion of the brachiocephalic artery and proximal right subclavian artery, with the false lumen now being opacified as opposed to having a thrombosed appearance on the prior CTA and with decreased narrowing of the true lumen. The dissection flap also slightly extends into the origin of the right common carotid artery. Right carotid system: Patent without evidence of stenosis. Minimal atheromatous irregularity. Left carotid system: Patent without evidence of stenosis or dissection, although the common carotid origin was not imaged. Minimal atheromatous irregularity. Vertebral arteries: Patent without  evidence of a significant stenosis or dissection within limitations of dental streak artifact. Skeleton: Mild cervical spondylosis. Other neck: Thyroidectomy.  Right internal jugular venous catheter. Upper chest: Partially visualized right larger than left pleural effusions with compressive atelectasis. Endotracheal tube terminating at the level of the clavicular heads. Partially visualized enteric tube. Review of the MIP images confirms the above findings CTA HEAD FINDINGS Anterior circulation: The internal carotid arteries are patent from skull base to carotid termini with mild atherosclerotic plaque bilaterally not resulting in significant stenosis. There are broad-based 2 mm aneurysm is projecting laterally from the left  cavernous ICA and right supraclinoid ICA. ACAs and MCAs are patent without evidence of a proximal branch occlusion or significant proximal stenosis. Mild generalized ectatic appearance of the intracranial ICAs, ACAs, and MCAs diffusely. Posterior circulation: The intracranial vertebral arteries are patent to the basilar. Patent PICA, AICA, and SCA origins are seen bilaterally. The basilar artery is widely patent. There is a large left posterior communicating artery with absence of the left P1 segment. Both PCAs are patent without evidence of a significant proximal stenosis. No aneurysm is identified. Venous sinuses: Patent. Anatomic variants: Fetal left PCA. Review of the MIP images confirms the above findings IMPRESSION: 1. No large vessel occlusion. 2. Partially visualized known aortic dissection extending into the brachiocephalic artery, proximal right subclavian artery, and origin of the right common carotid artery. 3. Mild intracranial and cervical carotid atherosclerosis without significant proximal stenosis. 4. Small bilateral ICA aneurysms. 5. Bilateral pleural effusions. Electronically Signed   By: [name] M.D.   On: 09/19/2022 17:09   CT HEAD WO CONTRAST ( )  Result Date: 09/19/2022 CLINICAL DATA:  Neuro deficit, acute, stroke suspected. EXAM: CT HEAD WITHOUT CONTRAST TECHNIQUE: Contiguous axial images were obtained from the base of the skull through the vertex without intravenous contrast. RADIATION DOSE REDUCTION: This exam was performed according to the departmental dose-optimization program which includes automated exposure control, adjustment of the mA and/or kV according to patient size and/or use of iterative reconstruction technique. COMPARISON:  Noncontrast head CT 09/17/2022. FINDINGS: Brain: Mild generalized cerebral atrophy. New from the prior head CT of 09/17/2022, there are multiple small acute cortical/subcortical infarcts scattered within the right MCA territory, within the right  frontal and parietal lobes. Notably, an infarct involves the right precentral gyrus (series 3, image 30). There is a 4 mm focus of acute parenchymal hemorrhage associated with an infarct in the anterior right frontal lobe (series 3, image 25). Adjacent to this, there is a small curvilinear focus of hyperdensity along, also along the anterior right frontal lobe, which may reflect additional small volume acute hemorrhage or hyperdense thrombus within a distal right MCA vessel (series 6, image 19) (series 3, image 24). Additionally, there is a new acute infarct within the right corona radiata/caudate nucleus, measuring 1.9 x 1.5 x 2.2 cm (for instance as seen on series 3, image 23) (series 5, image 28). Background mild patchy and ill-defined hypoattenuation within the cerebral white matter, nonspecific but compatible with chronic small vessel disease. Tiny infarct within the right cerebellar hemisphere, not definitively present on the prior head CT of 09/17/2022 and possibly acute (series 3, image 7). No extra-axial fluid collection. No evidence of an intracranial mass. No midline shift. Vascular: Possible hyperdense thrombus within a distal right MCA vessel, as described above. Atherosclerotic calcifications. Skull: No fracture or aggressive osseous lesion. Sinuses/Orbits: No orbital mass or acute orbital finding. Mild mucosal thickening scattered within the bilateral ethmoid air cells. Impressions #1  and #2 called by telephone at the time of interpretation on 09/19/2022 at 10:41 am to provider [facility] , who verbally acknowledged these results. IMPRESSION: Interval development of patchy small acute cortical/subcortical infarcts within the right MCA vascular territory. 4 mm focus of hemorrhage associated with an infarct within the anterior right frontal lobe. Adjacent small curvilinear focus of hyperdensity, also along the anterior right frontal lobe, which may reflect additional small-volume acute hemorrhage  or hyperdense thrombus within a distal right MCA vessel. New acute infarct within the right corona radiata/basal ganglia, measuring 1.9 x 2.2 cm. Tiny infarct within the right cerebellar hemisphere, not definitively present on the prior head CT of 09/17/2022 and possibly acute. Background mild chronic small vessel changes within the cerebral white matter matter. Mild generalized cerebral atrophy. Electronically Signed   By: Jackey Loge D.O.   On: 09/19/2022 10:44     Medications:     Scheduled Medications:  sodium chloride   Intravenous Once   acetaminophen  1,000 mg Oral Q6H   Or   acetaminophen (TYLENOL) oral liquid 160 mg/5 mL  1,000 mg Per Tube Q6H   acetaminophen (TYLENOL) oral liquid 160 mg/5 mL  650 mg Per Tube Once   Or   acetaminophen  650 mg Rectal Once   amiodarone  150 mg Intravenous Once   bisacodyl  10 mg Oral Daily   Or   bisacodyl  10 mg Rectal Daily   Chlorhexidine Gluconate Cloth  6 each Topical Daily   docusate  200 mg Per Tube Daily   famotidine  20 mg Per Tube Daily   insulin aspart  0-24 Units Subcutaneous Q4H   insulin detemir  12 Units Subcutaneous BID   metoprolol tartrate  12.5 mg Oral BID   Or   metoprolol tartrate  12.5 mg Per Tube BID   mupirocin ointment  1 Application Nasal BID   mouth rinse  15 mL Mouth Rinse Q2H   potassium chloride  40 mEq Per Tube Once   sodium chloride flush  10-40 mL Intracatheter Q12H   sodium chloride flush  3 mL Intravenous Q12H    Infusions:  sodium chloride     sodium chloride     sodium chloride 10 mL/hr at 09/20/22 0700   amiodarone 30 mg/hr (09/20/22 0700)   dexmedetomidine (PRECEDEX) IV infusion 0.3 mcg/kg/hr (09/20/22 0700)   furosemide (LASIX) 200 mg in dextrose 5 % 100 mL (2 mg/mL) infusion 8 mg/hr (09/20/22 0700)   lactated ringers     lactated ringers 20 mL/hr at 09/20/22 0700   niCARDipine Stopped (09/19/22 1647)   phenylephrine (NEO-SYNEPHRINE) Adult infusion 5 mcg/min (09/20/22 0700)    PRN  Medications: sodium chloride, metoprolol tartrate, midazolam, morphine injection, ondansetron (ZOFRAN) IV, mouth rinse, oxyCODONE, sodium chloride flush, sodium chloride flush, traMADol    Patient Profile   81 y/o male w/ h/o HTN, HLD, AAA s/p stent graft admitted w/ Type 1 Aortic Dissection, s/p emergent repair.   Assessment/Plan   1. Type 1 Aortic Dissection  - s/p emergent repair 11/4 - management per CT surgery  - holding ASA w/ thrombocytopenia    2. Hypertension  - pressures have been labile, in setting of agitation   - parameters per CT surgery and Neuro  - plan is to try to extubate to help w/ agitation    3. HFpEF - Intra-op TEE EF 60-65%, RV ok - volume up, post large blood/ fluid resuscitation due to problem #1 + RBP  - Continue to  diurese w/ lasix gtt at 4/hr. Follow renal fx - GDMT limited by hypotension and AKI    4. ABLA/ Thrombocytopenia  - dissection w/ RBP + expected blood loss in OR - required multiple units of PRBC's, Plts, FFP, and Cryoprecipitate in OR - Hgb 7.9 today  - watch Plts, 68>>57>>51>>50K  - holding ASA    5. Post-Op PAF - amio gtt 30/hr  - NSR today, HR 70s  - keep K > 4.0 and Mg > 2.0    6. Pericardial Effusion  - mod by Intra-op TEE, no tamponade - CTs remain in place, 365 cc output yesterday  - plan TTE in several days     7. Acute Hypoxic Respiratory Failure - remains intubated  - CXR w/ left PTX + mild edema  - per CT surgery  - per CT surgery, will try to wean/extubate today     8. Rt Sided Strokes   - CT head 11/6 showed 3 small right-sided strokes 1 in the cerebellum 1 in the right basal ganglia and 1 in the frontal lobe with a 4 mm area of bleeding  - intermittently following commands  - neurology following  - routine EEG to rule out seizures. - hold off on antiplatelet/AC. - HbA1c 5.5,  LDL 42 - Keep SBP below [id number] systolic.    9. AKI  - B/l Scr ~1.0>>spiked to 1.90  - suspect ATN from hemorrhagic  shock/hypotension, now post resuscitation - SCr 2.11>>2.15>>2.23>>1.99 today  - nonoliguric, 3.5 in UOP yesterday  - follow w/ diuresis  - keep MAPs > 70   10. Hypokalemia - K 3.1 - Supp w/ KCl   Length of Stay: 3  Lyda Jester, PA-C  09/20/2022, 7:37 AM  Advanced Heart Failure Team Pager [phone] (M-F; 7a - 5p)  Please contact [facility] for night-coverage after hours (5p -7a ) and weekends on [email or web address]  Agree with above.   Remains intubated. Was very agitated last night/this am. Pulling at tube. (Was moving all 4). Now resedated. ETT repositioned.   Remains in NSR on IV amio. Diuresing well with low-dose IV lasix. Scr improving.   BP stable. PLTs remain 50k  General:  sitting up in bed. Intubated. Sedated.  HEENT: normal Neck: supple. no JVD. Carotids 2+ bilat; no bruits. No lymphadenopathy or thryomegaly appreciated. Cor: Sternal wound ok. CTs dry Regular rate & rhythm. No rubs, gallops or murmurs. Lungs: clear Abdomen: soft, nontender, nondistended. No hepatosplenomegaly. No bruits or masses. Good bowel sounds. Extremities: no cyanosis, clubbing, rash, edema Neuro: sedated in restraints for safety   Hemodynamically stable. Remains in NSR. AKI improving. Main issue now is agitation and vent wean. Will ask CCM to help with sedation regimen. Place Cor-trak for post-pyloric feeds.  Continue diuresis at least one more day.   CRITICAL CARE Performed by: Glori Bickers  Total critical care time: 35 minutes  Critical care time was exclusive of separately billable procedures and treating other patients.  Critical care was necessary to treat or prevent imminent or life-threatening deterioration.  Critical care was time spent personally by me (independent of midlevel providers or residents) on the following activities: development of treatment plan with patient and/or surrogate as well as nursing, discussions with consultants, evaluation of patient's response to  treatment, examination of patient, obtaining history from patient or surrogate, ordering and performing treatments and interventions, ordering and review of laboratory studies, ordering and review of radiographic studies, pulse oximetry and re-evaluation of patient's condition.  Glori Bickers, MD  10:51  AM

## 2022-09-22 ENCOUNTER — Inpatient Hospital Stay (HOSPITAL_COMMUNITY): Payer: Medicare Other

## 2022-09-22 ENCOUNTER — Encounter (HOSPITAL_COMMUNITY): Payer: Self-pay | Admitting: Thoracic Surgery (Cardiothoracic Vascular Surgery)

## 2022-09-22 ENCOUNTER — Other Ambulatory Visit: Payer: Self-pay

## 2022-09-22 DIAGNOSIS — I6310 Cerebral infarction due to embolism of unspecified precerebral artery: Secondary | ICD-10-CM | POA: Diagnosis not present

## 2022-09-22 DIAGNOSIS — Z9911 Dependence on respirator [ventilator] status: Secondary | ICD-10-CM

## 2022-09-22 DIAGNOSIS — G9341 Metabolic encephalopathy: Secondary | ICD-10-CM

## 2022-09-22 DIAGNOSIS — J9601 Acute respiratory failure with hypoxia: Secondary | ICD-10-CM

## 2022-09-22 DIAGNOSIS — Z9889 Other specified postprocedural states: Secondary | ICD-10-CM

## 2022-09-22 LAB — CBC
HCT: 23.3 % — ABNORMAL LOW (ref 39.0–52.0)
Hemoglobin: 7.6 g/dL — ABNORMAL LOW (ref 13.0–17.0)
MCH: 29.2 pg (ref 26.0–34.0)
MCHC: 32.6 g/dL (ref 30.0–36.0)
MCV: 89.6 fL (ref 80.0–100.0)
Platelets: 60 10*3/uL — ABNORMAL LOW (ref 150–400)
RBC: 2.6 MIL/uL — ABNORMAL LOW (ref 4.22–5.81)
RDW: 15.5 % (ref 11.5–15.5)
WBC: 9.4 10*3/uL (ref 4.0–10.5)
nRBC: 0 % (ref 0.0–0.2)

## 2022-09-22 LAB — COMPREHENSIVE METABOLIC PANEL
ALT: 7 U/L (ref 0–44)
AST: 17 U/L (ref 15–41)
Albumin: 2.6 g/dL — ABNORMAL LOW (ref 3.5–5.0)
Alkaline Phosphatase: 38 U/L (ref 38–126)
Anion gap: 9 (ref 5–15)
BUN: 50 mg/dL — ABNORMAL HIGH (ref 8–23)
CO2: 29 mmol/L (ref 22–32)
Calcium: 8.1 mg/dL — ABNORMAL LOW (ref 8.9–10.3)
Chloride: 109 mmol/L (ref 98–111)
Creatinine, Ser: 2.01 mg/dL — ABNORMAL HIGH (ref 0.61–1.24)
GFR, Estimated: 33 mL/min — ABNORMAL LOW (ref >60)
Glucose, Bld: 162 mg/dL — ABNORMAL HIGH (ref 70–99)
Potassium: 4.2 mmol/L (ref 3.5–5.1)
Sodium: 147 mmol/L — ABNORMAL HIGH (ref 135–145)
Total Bilirubin: 0.9 mg/dL (ref 0.3–1.2)
Total Protein: 5.1 g/dL — ABNORMAL LOW (ref 6.5–8.1)

## 2022-09-22 LAB — BASIC METABOLIC PANEL
Anion gap: 7 (ref 5–15)
BUN: 51 mg/dL — ABNORMAL HIGH (ref 8–23)
CO2: 28 mmol/L (ref 22–32)
Calcium: 7.8 mg/dL — ABNORMAL LOW (ref 8.9–10.3)
Chloride: 107 mmol/L (ref 98–111)
Creatinine, Ser: 1.73 mg/dL — ABNORMAL HIGH (ref 0.61–1.24)
GFR, Estimated: 40 mL/min — ABNORMAL LOW (ref >60)
Glucose, Bld: 132 mg/dL — ABNORMAL HIGH (ref 70–99)
Potassium: 3.3 mmol/L — ABNORMAL LOW (ref 3.5–5.1)
Sodium: 142 mmol/L (ref 135–145)

## 2022-09-22 LAB — GLUCOSE, CAPILLARY
Glucose-Capillary: 117 mg/dL — ABNORMAL HIGH (ref 70–99)
Glucose-Capillary: 159 mg/dL — ABNORMAL HIGH (ref 70–99)
Glucose-Capillary: 159 mg/dL — ABNORMAL HIGH (ref 70–99)
Glucose-Capillary: 146 mg/dL — ABNORMAL HIGH (ref 70–99)
Glucose-Capillary: 138 mg/dL — ABNORMAL HIGH (ref 70–99)
Glucose-Capillary: 105 mg/dL — ABNORMAL HIGH (ref 70–99)
Glucose-Capillary: 107 mg/dL — ABNORMAL HIGH (ref 70–99)

## 2022-09-22 MED ORDER — FAMOTIDINE IN NACL 20-0.9 MG/50ML-% IV SOLN
20.0000 mg | INTRAVENOUS | Status: DC
  Administered 2022-09-22 – 2022-09-23 (×2): 20 mg via INTRAVENOUS
  Filled 2022-09-22 (×2): qty 50

## 2022-09-22 MED ORDER — POTASSIUM CHLORIDE 20 MEQ PO PACK
40.0000 meq | PACK | Freq: Once | ORAL | Status: AC
  Administered 2022-09-22: 40 meq
  Filled 2022-09-22: qty 2

## 2022-09-22 MED ORDER — ACETAMINOPHEN 500 MG PO TABS
1000.0000 mg | ORAL_TABLET | Freq: Four times a day (QID) | ORAL | Status: AC
  Administered 2022-09-26 – 2022-09-27 (×2): 1000 mg via ORAL
  Filled 2022-09-22 (×3): qty 2

## 2022-09-22 MED ORDER — POTASSIUM CHLORIDE 10 MEQ/50ML IV SOLN
10.0000 meq | INTRAVENOUS | Status: AC
  Administered 2022-09-22 (×3): 10 meq via INTRAVENOUS
  Filled 2022-09-22 (×3): qty 50

## 2022-09-22 MED ORDER — ACETAMINOPHEN 160 MG/5ML PO SOLN
1000.0000 mg | Freq: Four times a day (QID) | ORAL | Status: AC
  Administered 2022-09-23 – 2022-09-28 (×17): 1000 mg
  Filled 2022-09-22 (×18): qty 40.6

## 2022-09-22 MED ORDER — QUETIAPINE FUMARATE 25 MG PO TABS
12.5000 mg | ORAL_TABLET | Freq: Every day | ORAL | Status: DC
  Administered 2022-09-23: 12.5 mg
  Filled 2022-09-22: qty 1

## 2022-09-22 MED ORDER — ACETAMINOPHEN 650 MG RE SUPP
650.0000 mg | RECTAL | Status: AC
  Administered 2022-09-22 – 2022-09-23 (×3): 650 mg via RECTAL
  Filled 2022-09-22 (×3): qty 1

## 2022-09-22 MED ORDER — FUROSEMIDE 10 MG/ML IJ SOLN
80.0000 mg | Freq: Once | INTRAMUSCULAR | Status: AC
  Administered 2022-09-22: 80 mg via INTRAVENOUS
  Filled 2022-09-22: qty 8

## 2022-09-22 NOTE — Progress Notes (Signed)
      [address]       [place] [address]             [phone]      Alert, tonguing tube  BP 134/66   Pulse 64   Temp 98.7 F (37.1 C) (Oral)   Resp 20   Ht 6' (1.829 m)   Wt 88.2 kg   SpO2 99%   BMI 26.37 kg/m   Intake/Output Summary (Last 24 hours) at 09/22/2022 1744 Last data filed at 09/22/2022 1600 Gross per 24 hour  Intake 1818.47 ml  Output 4785 ml  Net -2966.53 ml   CT HEAD WITHOUT CONTRAST   TECHNIQUE: Contiguous axial images were obtained from the base of the skull through the vertex without intravenous contrast.   RADIATION DOSE REDUCTION: This exam was performed according to the departmental dose-optimization program which includes automated exposure control, adjustment of the mA and/or kV according to patient size and/or use of iterative reconstruction technique.   COMPARISON:  CT head 09/19/2022   FINDINGS: Brain: Multiple small areas of infarct in the right MCA territory appear slightly more prominent. The infarct in the right external capsule and corona radiata appears larger more hypodense. The infarct in the right frontal convexity cortex appears slightly larger. There is a small adjacent cortical infarct which was not seen previously. Small hypodensity in the right parietal lobe appears more prominent and likely is also acute infarct. Small hypodensity right occipital parietal lobe is more prominent.   4 mm focus of hyperdense hemorrhage in the right anterolateral frontal lobe is unchanged. No new hemorrhage.   Generalized atrophy. Patchy white matter hypodensity bilaterally compatible chronic microvascular ischemia. Small hypodensity right cerebellum likely is a chronic infarct.   Vascular: Negative for hyperdense vessel   Skull: Negative   Sinuses/Orbits: Paranasal sinuses clear.  Bilateral cataract surgery   Other: None   IMPRESSION: Scattered areas of acute infarct in the right MCA territory appear more  prominent and slightly larger compared with 3 days ago. This is likely evolutionary change with edema in the infarct   4 mm focus of hemorrhage in the right anterolateral frontal lobe unchanged. No new hemorrhage.     Electronically Signed   By: Marlan Palau M.D.   On: 09/22/2022 16:07   Possible extubation this evening, defer to Dr. San Jetty C. Dorris Fetch, MD [facility] [phone]

## 2022-09-22 NOTE — Progress Notes (Signed)
eLink Physician-Brief Progress Note Patient Name: James Estrada DOB: 03-Dec-1940 MRN: [id number]   Date of Service  09/22/2022  HPI/Events of Note  Patient extubated now. NPO and no gastric tube. Nursing request to review medication  list for medications that can be changed to IV. Review of enteral route medications review reveals that only Pepcid can be changed to an IV formulation.  eICU Interventions  Will change Pepcid dose route to IV.      Intervention Category Major Interventions: Other:  Lenell Antu 09/22/2022, 8:53 PM

## 2022-09-22 NOTE — Progress Notes (Signed)
Pt was transported via ventilator to CT with no apparent complications. Pt is now in 2H01 and is currently stable at this time. 2H RT aware Pt is back in unit.

## 2022-09-22 NOTE — Progress Notes (Signed)
Reassessed multiple times today, gradually waking up. This evening more awake, family has had to intermittently calm him down from being too agitated. Strong cough, has been swallowing spontaneously. Will extubate to [place] this evening- has 3 family members at bedside currently. D/w RN & RT.  Steffanie Dunn, DO 09/22/22 6:32 PM [facility]

## 2022-09-22 NOTE — Op Note (Signed)
NAME: MAMADI, FERRARIS MEDICAL RECORD NO: [id number] ACCOUNT NO: [id number] DATE OF BIRTH: September 27, 1941 FACILITY: [facility] LOCATION: [facility] PHYSICIAN: Revonda Standard. Roxan Hockey, MD  Operative Report   DATE OF PROCEDURE: 09/17/2022  PREOPERATIVE DIAGNOSIS:  Type 1 aortic dissection.  POSTOPERATIVE DIAGNOSIS:  Type 1 aortic dissection.  PROCEDURE:   Median sternotomy.  Extracorporeal circulation. Hemiarch repair of type 1 dissection under moderate hypothermic circulatory arrest using 28 mm Hemashield graft. Retrograde cerebral perfusion Femoral arterial cannulation  SURGEON:  Remo Lipps C. Roxan Hockey, MD  ASSISTANT:  Enid Cutter, PA-C.  ANESTHESIA:  General.  FINDINGS:  Transesophageal echocardiography showed type 1 dissection, preserved left ventricular function, no significant aortic insufficiency. Post-bypass, preserved left ventricular function with no aortic insufficiency. Large ascending aneurysm/dissection with small leaks and bloody pericardial effusion. Tear in the mid ascending aorta.  Intima intact with no secondary tears in the arch.  Trileaflet normal-appearing aortic valve.  CLINICAL NOTE:  Mr. Tota is a 81 year old gentleman who presented to the Emergency Room on 09/17/2022 with sudden onset of bilateral leg weakness and chest and left upper back pain.  Workup showed a type 1 aortic dissection with a suspected thrombosed  false lumen.  A neurologic evaluation showed that he was alert and oriented with fluent speech, good strength in both upper extremities and severe left lower extremity weakness, although he could move the left leg.  He was advised to undergo emergent repair of the type 1 aortic dissection.   The indications, risks, benefits, and alternatives were discussed in detail with the patient.  He understood and accepted the risks and agreed to proceed.  DESCRIPTION OF PROCEDURE:  Mr. Amburgey was brought emergently to the operating room on 09/17/2022.  Anesthesia  placed an arterial blood pressure monitoring line and a Swan-Ganz catheter. He was anesthetized and intubated.  Transesophageal  echocardiography was performed.  Please see the separately dictated note for full details of that procedure by Dr. Smith Robert.  Intravenous antibiotics were administered.  Foley catheter was placed.  The chest, abdomen and legs were prepped and draped in the  usual sterile fashion.  A timeout was performed.  Because the dissection extended into the innominate artery with stenosis near the bifurcation, the decision was made to use femoral cannulation rather than right axillary cannulation.  An incision was made in the right groin and  carried through the skin and subcutaneous tissues.  Fascial sheath was incised and the right common femoral artery was identified at the bifurcation.  Vessel loops were placed around the common femoral artery as well as the profunda femoris and  superficial femoral artery.  A pursestring suture was placed in the common femoral artery. It was cannulated using the Seldinger technique and an 18-French arterial cannula was inserted.  This was connected to the bypass circuit.  5000 units of heparin  was administered during femoral cannulation.  The groin wound was packed. A median sternotomy was performed.  After achieving initial hemostasis, the remainder of the full heparin dose was given.  A sternal retractor was placed and was gradually opened.  After  confirming adequate anticoagulation, the pericardium was opened.  There was a bloody pericardial effusion.  There was a large aortic hematoma and hematoma extending onto the pulmonary artery.  There were small leaks from the adventitia.  A dual stage  venous cannula was placed via a pursestring suture in the right atrium.  Cardiopulmonary bypass was initiated.  Flows were maintained per protocol.  Cooling was begun with a target temperature of 24  degrees Celsius.  A left ventricular vent was placed  via  pursestring suture in the right superior pulmonary vein and a retrograde cardioplegia cannula was placed via pursestring suture in the right atrium.  A temperature probe was placed in the myocardial septum.  The aorta was crossclamped.  Cardiac  arrest was achieved with cold retrograde KBC blood cardioplegia and topical iced saline.  The calculated dose was administered.  There was a diastolic arrest and good septal cooling.  The aorta was transected.  There was a tear in the mid ascending  aorta.  There was partial thrombosis of the false lumen, but not as much as suggested on the CT. Proximally, the tear extended into the noncoronary sinus, but did not affect the left or right coronary sinuses. The ostia of both vessels were patent.  There  was some mild calcification adjacent to the right coronary ostium.  The aorta sized for a 28 mm Hemashield graft.  Fibrin glue was applied in the dissected noncoronary sinus.  The aortic valve was a trileaflet valve with normal leaflets.  An end-to-end  anastomosis was performed of the graft to the aorta at the sinotubular junction with a running 4-0 Prolene suture.  A Teflon felt strip was used to pledget the aortic side.  Additional cardioplegia was administered at 45 minutes after the initial dose rather than 60 minutes.  The patient subsequently reached 24 degrees Celsius, he was given steroids and propofol, placed in steep Trendelenburg position.  The head was packed in ice.  It should be  noted that a cardioplegia cannula had been placed into the SVC and carbon dioxide was being insufflated into the operative field.  The pump flow was stopped for moderate hypothermic circulatory arrest.  The aortic crossclamp was removed and the  patient was exsanguinated.  The arch was inspected.  The intima was intact throughout the arch.  The dissection did extend around the origin of the innominate artery, but there was good visualization into the artery once the aorta was  trimmed.  There were no secondary tears in the innominate.   Fibrin glue was applied into the media of the innominate as well as into the aorta.  A hemiarch repair was performed.  A Teflon felt strip was sewn to the aortic wall around the areas where the fibrin glue was then applied.  A second piece of the Hemashield graft was cut on a bevel  to match the size of the aorta and an end-to-end anastomosis was performed with a running 4-0 Prolene suture.  Retrograde cerebral perfusion was administered per protocol during the circulatory arrest time. There was backbleeding noted from both the innominate and left common carotid vessels.  At the completion of the anastomosis, the graft was deaired.  A crossclamp was applied and flow was resumed and warming was initiated.    The total circulatory arrest time was 39 minutes. Warming was relatively slow.  A graft-to-graft end-to-end anastomosis then was performed with a running 4-0 Prolene suture.  De-airing was performed and the crossclamp was removed from the aortic graft.  The total crossclamp time was 63 minutes. There was bleeding at all 3 anastomoses and bleeding sites were repaired with 4-0 Prolene pledgeted sutures. As rewarming progressed, once the patient had a reliable cardiac rhythm, the left ventricular vent was removed.  De-airing was again performed prior to doing so.  The retrograde cardioplegia cannula was removed as was the SVC catheter used for retrograde cerebral perfusion.  Epicardial pacing wires  placed on the right ventricle and right atrium.  When the patient had rewarmed to a core temperature of 37 degrees Celsius, he was weaned from cardiopulmonary bypass.  The total bypass time was 209 minutes.  The patient was hemodynamically stable, but had diffuse coagulopathy bleeding.  He was noted to have thrombocytopenia, packed red blood cells, platelets, fresh frozen plasma and cryoprecipitate were administered.  There was still coagulopathic bleeding and  finally factor VII was administered.  Prior to final closure of the chest, there appeared to be relatively good hemostasis taking into account the degree of coagulopathy.  The pericardium was reapproximated over the aortic graft. Two mediastinal drains were placed.  The sternum was closed with a combination of single and double heavy gauge stainless steel wires.  The pectoralis fascia, subcutaneous tissue and skin were closed in standard fashion.  All sponge, needle and instrument counts were correct at the end of the procedure.  The patient was taken from the operating room to the Surgical Intensive Care Unit, intubated and in critical condition.  Experienced assistance was necessary for this case due to surgical complexity.  Enid Cutter served as Licensed conveyancer providing exposure, retraction of delicate tissues, suctioning and suture management.     PAA D: 09/21/2022 5:55:57 pm T: 09/22/2022 2:23:00 am  JOB: [id number]

## 2022-09-22 NOTE — Progress Notes (Addendum)
Advanced Heart Failure Rounding Note  PCP-Cardiologist: None   Subjective:    S/p type 1 Aortic dissection repair. POD#5  Head CT 11/6 showed small  acute cortical/subcortical infarcts within the right MCA vascular territory. 4 mm focus of hemorrhage associated with an infarct within the anterior right frontal lobe.  Remains intubated. Moves extremities and follows commands when off sedation.   Diuresing well w/ lasix gtt, currently at 4/hr, 3.6L in UOP yesterday. Wt trending down   SCr 1.84>>2.11>>2.15>>2.23>>1.99>>1.73   K 3.3  On amio gtt 30/hr. NSR 70s.    Hgb 7.1>>7.8>>8.2>>7.9>7.6  Plts 51>>50>>60K   Objective:   Weight Range: 88.2 kg Body mass index is 26.37 kg/m.   Vital Signs:   Temp:  [97.7 F (36.5 C)-98.6 F (37 C)] 98.3 F (36.8 C) (11/09 0724) Pulse Rate:  [52-83] 60 (11/09 0700) Resp:  [12-37] 14 (11/09 0700) BP: (82-161)/(40-96) 114/59 (11/09 0700) SpO2:  [82 %-100 %] 99 % (11/09 0700) Arterial Line BP: (99-210)/(38-108) 145/41 (11/09 0700) FiO2 (%):  [40 %] 40 % (11/09 0400) Weight:  [88.2 kg] 88.2 kg (11/09 0500) Last BM Date : 09/21/22  Weight change: Filed Weights   09/20/22 0448 09/21/22 0500 09/22/22 0500  Weight: 92.9 kg 92.7 kg 88.2 kg    Intake/Output:   Intake/Output Summary (Last 24 hours) at 09/22/2022 0736 Last data filed at 09/22/2022 0700 Gross per 24 hour  Intake 1544.25 ml  Output 3965 ml  Net -2420.75 ml      Physical Exam    General: intubated. No distress, waking up from sedation  HEENT: Normal + ETT  Neck: Supple. JVP not well visualized . Carotids 2+ bilat; no bruits. No lymphadenopathy or thyromegaly appreciated. Cor: PMI nondisplaced. Regular rate & rhythm. No rubs, gallops or murmurs. + CTs Lungs: intubated and CTAB  Abdomen: Soft, nontender, nondistended. No hepatosplenomegaly. No bruits or masses. Good bowel sounds. Extremities: No cyanosis, clubbing, rash, trace b/l LE edema GU: + foley  Neuro:  intubated, moves all 4 extremities    Telemetry   NSR 60s   EKG    No new EKG to review   Labs    CBC Recent Labs    09/21/22 0522 09/22/22 0422  WBC 10.4 9.4  HGB 7.9* 7.6*  HCT 23.0* 23.3*  MCV 86.5 89.6  PLT 50* 60*   Basic Metabolic Panel Recent Labs    09/21/22 0522 09/21/22 1758 09/22/22 0422  NA 143  --  142  K 3.1*  --  3.3*  CL 106  --  107  CO2 28  --  28  GLUCOSE 147*  --  132*  BUN 43*  --  51*  CREATININE 1.99*  --  1.73*  CALCIUM 7.5*  --  7.8*  MG 2.5* 2.6*  --   PHOS 3.3 3.6  --    Liver Function Tests Recent Labs    09/21/22 0522  AST 22  ALT 9  ALKPHOS 32*  BILITOT 0.9  PROT 4.7*  ALBUMIN 2.6*   No results for input(s): "LIPASE", "AMYLASE" in the last 72 hours. Cardiac Enzymes No results for input(s): "CKTOTAL", "CKMB", "CKMBINDEX", "TROPONINI" in the last 72 hours.  BNP: BNP (last 3 results) No results for input(s): "BNP" in the last 8760 hours.  ProBNP (last 3 results) No results for input(s): "PROBNP" in the last 8760 hours.   D-Dimer No results for input(s): "DDIMER" in the last 72 hours.  Hemoglobin A1C Recent Labs    09/19/22 1507  HGBA1C 5.5   Fasting Lipid Panel No results for input(s): "CHOL", "HDL", "LDLCALC", "TRIG", "CHOLHDL", "LDLDIRECT" in the last 72 hours.  Thyroid Function Tests No results for input(s): "TSH", "T4TOTAL", "T3FREE", "THYROIDAB" in the last 72 hours.  Invalid input(s): "FREET3"  Other results:   Imaging    No results found.   Medications:     Scheduled Medications:  acetaminophen  1,000 mg Oral Q6H   Or   acetaminophen (TYLENOL) oral liquid 160 mg/5 mL  1,000 mg Per Tube Q6H   acetaminophen (TYLENOL) oral liquid 160 mg/5 mL  650 mg Per Tube Once   Or   acetaminophen  650 mg Rectal Once   amiodarone  150 mg Intravenous Once   bisacodyl  10 mg Oral Daily   Or   bisacodyl  10 mg Rectal Daily   Chlorhexidine Gluconate Cloth  6 each Topical Daily   clonazePAM  0.25 mg  Per Tube BID   docusate  200 mg Per Tube Daily   famotidine  20 mg Per Tube Daily   feeding supplement (PROSource TF20)  60 mL Per Tube BID   insulin aspart  0-24 Units Subcutaneous Q4H   insulin detemir  12 Units Subcutaneous BID   levalbuterol  0.63 mg Nebulization TID   metoprolol tartrate  12.5 mg Oral BID   Or   metoprolol tartrate  12.5 mg Per Tube BID   mupirocin ointment  1 Application Nasal BID   mouth rinse  15 mL Mouth Rinse Q2H   oxyCODONE  5 mg Per Tube Q6H   QUEtiapine  25 mg Per Tube QHS   sodium chloride flush  10-40 mL Intracatheter Q12H   sodium chloride flush  3 mL Intravenous Q12H   sodium chloride HYPERTONIC  4 mL Nebulization TID    Infusions:  sodium chloride     sodium chloride 10 mL/hr at 09/22/22 0700   amiodarone 30 mg/hr (09/22/22 0700)   dexmedetomidine (PRECEDEX) IV infusion Stopped (09/21/22 1053)   feeding supplement (VITAL 1.5 CAL) 30 mL/hr at 09/22/22 0700   furosemide (LASIX) 200 mg in dextrose 5 % 100 mL (2 mg/mL) infusion 4 mg/hr (09/22/22 0700)   lactated ringers     lactated ringers Stopped (09/20/22 1403)   niCARDipine Stopped (09/19/22 1647)   phenylephrine (NEO-SYNEPHRINE) Adult infusion Stopped (09/20/22 0735)    PRN Medications: sodium chloride, fentaNYL (SUBLIMAZE) injection, metoprolol tartrate, ondansetron (ZOFRAN) IV, mouth rinse, oxyCODONE, sodium chloride flush, sodium chloride flush, traMADol    Patient Profile   81 y/o male w/ h/o HTN, HLD, AAA s/p stent graft admitted w/ Type 1 Aortic Dissection, s/p emergent repair.   Assessment/Plan   1. Type 1 Aortic Dissection  - s/p emergent repair 11/4 - management per CT surgery  - holding ASA w/ thrombocytopenia    2. Hypertension  - pressures have been labile, in setting of agitation   - parameters per CT surgery and Neuro  - plan is to try to extubate today to help w/ agitation    3. HFpEF - Intra-op TEE EF 60-65%, RV ok - volume up, post large blood/ fluid  resuscitation due to problem #1 + RBP. Improving w/ diuresis   - Continue to diurese today, 80 mg IV x 1  - GDMT limited by hypotension and AKI    4. ABLA/ Thrombocytopenia  - dissection w/ RBP + expected blood loss in OR - required multiple units of PRBC's, Plts, FFP, and Cryoprecipitate in OR - Hgb 7.6 today  -  watch Plts, 68>>57>>51>>50>>60K  - holding ASA    5. Post-Op PAF - amio gtt 30/hr  - NSR today, HR 70s  - keep K > 4.0 and Mg > 2.0    6. Pericardial Effusion  - mod by Intra-op TEE, no tamponade - hemodynamics stable - continue diuresis      7. Acute Hypoxic Respiratory Failure - remains intubated  - CXR w/ left PTX + mild edema  - per CT surgery  - per CT surgery, will try to extubate today     8. Rt Sided Strokes   - CT head 11/6 showed 3 small right-sided strokes 1 in the cerebellum 1 in the right basal ganglia and 1 in the frontal lobe with a 4 mm area of bleeding  - following commands  - neurology following  - routine EEG to rule out seizures. - hold off on antiplatelet/AC. - HbA1c 5.5,  LDL 42 - Keep SBP below 150s    9. AKI  - B/l Scr ~1.0>>spiked to 1.90  - suspect ATN from hemorrhagic shock/hypotension, now post resuscitation - SCr 2.11>>2.15>>2.23>>1.99>>1.73 today  - nonoliguric, 3.6 in UOP yesterday  - follow w/ diuresis  - keep MAPs > 70   10. Hypokalemia - K 3.3 - Supp w/ KCl   Length of Stay: 5  Brittainy Simmons, PA-C  09/22/2022, 7:36 AM  Advanced Heart Failure Team Pager [phone] (M-F; 7a - 5p)  Please contact [facility] for night-coverage after hours (5p -7a ) and weekends on [email or web address]  Agree with above.   Sleepy this am but was more awake overnight. Moving all 4 though weak on left. Weaning vent. Diuresing well. Renal function improving.   General:  Intubated sedated  HEENT: normal + ETT Neck: supple.  JVP 7  Carotids 2+ bilat; no bruits.  Cor: Sternal wound ok . Regular rate & rhythm. No rubs, gallops or  murmurs. Lungs: dull at bases R>L  Abdomen: soft, nontender, nondistended. No hepatosplenomegaly. No bruits or masses. Good bowel sounds. Extremities: no cyanosis, clubbing, rash, edema Neuro: intubated sedated  Diuresing well. BP ok. No further AF. Tolerating vent wean.  Will continue diuresis. Hopefully can be extubated today. D/w CCM at bedside  CRITICAL CARE Performed by: Arvilla Meres  Total critical care time: 35 minutes  Critical care time was exclusive of separately billable procedures and treating other patients.  Critical care was necessary to treat or prevent imminent or life-threatening deterioration.  Critical care was time spent personally by me (independent of midlevel providers or residents) on the following activities: development of treatment plan with patient and/or surrogate as well as nursing, discussions with consultants, evaluation of patient's response to treatment, examination of patient, obtaining history from patient or surrogate, ordering and performing treatments and interventions, ordering and review of laboratory studies, ordering and review of radiographic studies, pulse oximetry and re-evaluation of patient's condition.  Arvilla Meres, MD  3:08 PM

## 2022-09-22 NOTE — Progress Notes (Signed)
NAME:  James Estrada, MRN:  [id number], DOB:  [birth year], LOS: 5 ADMISSION DATE:  09/17/2022, CONSULTATION DATE:  11/8 REFERRING MD:  Bensimhon, CHIEF COMPLAINT:  encephalpathy, respiratory failure   History of Present Illness:  James Estrada is a 81 y/o gentleman with a history of AAA, GERD, HLD, HTN who presented with acute onset of weakness and pain across his chest and upper back that caused him to collapse on 11/4.  In the ED a code stroke was called.  He was was found to have an acute ascending aortic dissection with thrombosis of the false lumen with extension into the brachiocephalic artery with narrowing and into the proximal carotid artery.  CT scan also noted left retroperitoneal hemorrhage from the iliics to the left kidney and decreased perfusion to the left kidney.  He underwent emergency dissection repair with vascular graft on 11/4.  He was found to have embolic strokes postoperatively.  He continues to require mechanical ventilation due to encephalopathy and agitation. At baseline he is very active, walking every day.  PCCM was consulted for assistance with ventilator weaning.  Pertinent  Medical History  AAA status post endovascular repair in 2020 Hypertension Hyperlipidemia GERD Cataract extraction bilaterally  Significant Hospital Events: Including procedures, antibiotic start and stop dates in addition to other pertinent events   11/4 Ascending aortic dissection, s/p graft repair on bypass 11/8 oral sedatives started, off precedex  Interim History / Subjective:  No acute events overnight, has been off IV sedation since yesterday, not needing PRNs.  Objective   Blood pressure (!) 101/54, pulse (!) 58, temperature 98.3 F (36.8 C), temperature source Oral, resp. rate 12, height 6' (1.829 m), weight 88.2 kg, SpO2 99 %. CVP:  [11 mmHg] 11 mmHg  Vent Mode: PRVC FiO2 (%):  [40 %] 40 % Set Rate:  [12 bmp] 12 bmp Vt Set:  [620 mL] 620 mL PEEP:  [5 cmH20] 5  cmH20 Pressure Support:  [5 cmH20] 5 cmH20 Plateau Pressure:  [10 cmH20-16 cmH20] 10 cmH20   Intake/Output Summary (Last 24 hours) at 09/22/2022 0726 Last data filed at 09/22/2022 0600 Gross per 24 hour  Intake 1485.63 ml  Output 3965 ml  Net -2479.37 ml    Filed Weights   09/20/22 0448 09/21/22 0500 09/22/22 0500  Weight: 92.9 kg 92.7 kg 88.2 kg   I/O -2.5L , net -6L for admission  Examination: General: critically ill appearing man lying in bed in NAD HENT: [place]/AT, eyes anicteric Lungs: breathing comfortably on 5/5, CTAB Cardiovascular: S1S2, RRR Abdomen: soft, NT Extremities: improving pedal edema Neuro: RASS -2, moving all extremities, but not following commands. Swallowing spontaneously GU: foley with amber urine Derm: warm, dry, no diffuse rashes  K+ 3.3 BUN 51 Cr 1.73 WBC 9.4 H/H 7.6/23.3 Platelets 60  CXR personally reviewed- R layering effusion  Resolved Hospital Problem list     Assessment & Plan:  Acute ascending aortic dissection s/p graft repair Pericardial effusion -post-op care per TCTS -Changing postop pain control meds given AKI- fentanyl and oxycodone preferred to prevent accumulation - Postop pacing wires, chest tubes with cardiothoracic surgery.  Postop paroxysmal atrial fibrillation Acute HFpEF Periodic hypertension with agitation - Amiodarone --con't diuresis -monitor on tele  Acute respiratory failure with hypoxia requiring MV Mucus plugging R pleural effusion -con't diureiss -con't hypertonic saline nebs - LTVV -daily SAT & SBT; hopeful for extubation today -VAP prevention protocol  AKI, improving Left renal artery narrowing demonstrated on CT -fentanyl, oxycodone PRN -strict I/Os - renally  dose meds, avoid nephrotoxic meds - maintain adequate renal perfusion  Embolic strokes complicating aortic dissection repair -appreciate stroke team's management- repeat head CT today -PT, OT, SLP  Hypokalemia,  resolved -monitor  Hyperglycemia -Continue detemir 12 units twice daily plus sliding scale insulin - goal blood glucose less than 180  Thrombocytopenia, consumptive vs due to critical illness Acute blood loss anemia -monitor -transfuse for Hb <7 or hemodynamically significant bleeding  Daughter &  wife updated today.  Best Practice (right click and "Reselect all SmartList Selections" daily)   Diet/type: tubefeeds DVT prophylaxis: SCD GI prophylaxis: H2B Lines: Central line, Arterial Line, and yes and it is still needed Foley:  Yes, and it is still needed Code Status:  full code Last date of multidisciplinary goals of care discussion [full scope]  Labs   CBC: Recent Labs  Lab 09/17/22 1722 09/17/22 1743 09/19/22 0528 09/19/22 1507 09/20/22 0400 09/20/22 0830 09/21/22 0522 09/22/22 0422  WBC 13.3*   < > 16.0* 20.1* 14.8*  --  10.4 9.4  NEUTROABS 9.5*  --   --   --   --   --   --   --   HGB 13.8   < > 7.1* 7.8* 8.2* 8.2* 7.9* 7.6*  HCT 43.0   < > 21.1* 22.8* 23.6* 24.0* 23.0* 23.3*  MCV 87.6   < > 84.7 84.8 85.2  --  86.5 89.6  PLT 115*   < > 57* 58* 51*  --  50* 60*   < > = values in this interval not displayed.     Basic Metabolic Panel: Recent Labs  Lab 09/19/22 1507 09/20/22 0400 09/20/22 0830 09/20/22 1310 09/20/22 1806 09/21/22 0522 09/21/22 1758 09/22/22 0422  NA 141 142 141  --  142 143  --  142  K 3.4* 3.4* 3.9  --  3.3* 3.1*  --  3.3*  CL 106 106  --   --  105 106  --  107  CO2 23 25  --   --  25 28  --  28  GLUCOSE 158* 151*  --   --  175* 147*  --  132*  BUN 29* 30*  --   --  37* 43*  --  51*  CREATININE 2.11* 2.15*  --   --  2.23* 1.99*  --  1.73*  CALCIUM 7.8* 7.4*  --   --  7.4* 7.5*  --  7.8*  MG 2.5* 2.4  --  2.3  --  2.5* 2.6*  --   PHOS  --   --   --  4.1  --  3.3 3.6  --     GFR: Estimated Creatinine Clearance: 38.6 mL/min (A) (by C-G formula based on SCr of 1.73 mg/dL (H)). Recent Labs  Lab 09/19/22 1507 09/20/22 0400  09/21/22 0522 09/22/22 0422  WBC 20.1* 14.8* 10.4 9.4     This patient is critically ill with multiple organ system failure which requires frequent high complexity decision making, assessment, support, evaluation, and titration of therapies. This was completed through the application of advanced monitoring technologies and extensive interpretation of multiple databases. During this encounter critical care time was devoted to patient care services described in this note for 40 minutes.   Steffanie Dunn, DO 09/22/22 1:01 PM [facility]

## 2022-09-22 NOTE — Progress Notes (Signed)
5 Days Post-Op Procedure(s) (LRB): REPAIR OF ACUTE ASCENDING THORACIC AORTIC DISSECTION USING 28 MM HEMASHIELD PLATINUM VASCULAR GRAFT (N/A) Subjective: Intubated, sedated  Objective: Vital signs in last 24 hours: Temp:  [97.7 F (36.5 C)-98.6 F (37 C)] 98.3 F (36.8 C) (11/09 0724) Pulse Rate:  [52-83] 60 (11/09 0700) Cardiac Rhythm: Atrial paced;Normal sinus rhythm (11/08 2000) Resp:  [12-37] 14 (11/09 0700) BP: (82-161)/(40-96) 114/59 (11/09 0700) SpO2:  [82 %-100 %] 97 % (11/09 0750) Arterial Line BP: (99-210)/(38-108) 145/41 (11/09 0700) FiO2 (%):  [40 %] 40 % (11/09 0750) Weight:  [88.2 kg] 88.2 kg (11/09 0500)  Hemodynamic parameters for last 24 hours: CVP:  [11 mmHg] 11 mmHg  Intake/Output from previous day: 11/08 0701 - 11/09 0700 In: 1544.3 [I.V.:694.3; NG/GT:696.7; IV Piggyback:153.3] Out: 4696 [Urine:3635; Emesis/NG output:250; Chest Tube:80] Intake/Output this shift: No intake/output data recorded.  General appearance: intubated Neurologic: moving all 4 R>L, UE> LE Heart: regular rate and rhythm Lungs: clear anteriorly Abdomen: normal findings: soft, non-tender Extremities: well perfused  Lab Results: Recent Labs    09/21/22 0522 09/22/22 0422  WBC 10.4 9.4  HGB 7.9* 7.6*  HCT 23.0* 23.3*  PLT 50* 60*   BMET:  Recent Labs    09/21/22 0522 09/22/22 0422  NA 143 142  K 3.1* 3.3*  CL 106 107  CO2 28 28  GLUCOSE 147* 132*  BUN 43* 51*  CREATININE 1.99* 1.73*  CALCIUM 7.5* 7.8*    PT/INR: No results for input(s): "LABPROT", "INR" in the last 72 hours. ABG    Component Value Date/Time   PHART 7.479 (H) 09/20/2022 0830   HCO3 25.2 09/20/2022 0830   TCO2 26 09/20/2022 0830   ACIDBASEDEF 1.0 09/18/2022 1629   O2SAT 94 09/20/2022 0830   CBG (last 3)  Recent Labs    09/21/22 2324 09/22/22 0310 09/22/22 0721  GLUCAP 120* 117* 159*    Assessment/Plan: S/P Procedure(s) (LRB): REPAIR OF ACUTE ASCENDING THORACIC AORTIC DISSECTION USING  28 MM HEMASHIELD PLATINUM VASCULAR GRAFT (N/A) POD # 5  NEURO- right sided CVA. Left hemiparesis, moving LUE more, still less with LLE than other extremities  Sedated this AM CV- hemodynamically stable  On Lopressor RESP- VDRF, hopefully can extubate today  CCM managing RENAL- creatinine improved c/w resolving AKI  Hypokalemia- supplement K  5L negative over past 2 days- BUN rising, stop Lasix drip   Will order intermittent Lasix as needed ENDO- CBG mildly elevated  Continue Levemir + SSI GI- tolerating TF  Pepcid for stress ulcer prophylaxis Anemia secondary to ABL- stable, monitor Thrombocytopenia persists- continue to monitor ID- afebrile, WBC normal- not on antibiotics at present [facility] Dfor DVT prophylaxis  LOS: 5 days    James Estrada 09/22/2022

## 2022-09-22 NOTE — Progress Notes (Signed)
Neurology Progress Note   S:// Patients  daughter is in the room. He is still intubated appears to be doing better today.  He is weaning on the ventilator and plans are to extubate later today.  He is arousable and follows only occasional Commands and has spontaneous movements on the right but does have significant left hemiparesis. Unable to obtain MRI BRAN. Due to  pacing wires  O:// Current vital signs: BP 128/60   Pulse 66   Temp 98.7 F (37.1 C) (Oral)   Resp (!) 23   Ht 6' (1.829 m)   Wt 88.2 kg   SpO2 97%   BMI 26.37 kg/m  Vital signs in last 24 hours: Temp:  [97.7 F (36.5 C)-98.7 F (37.1 C)] 98.7 F (37.1 C) (11/09 1131) Pulse Rate:  [54-71] 66 (11/09 1400) Resp:  [12-24] 23 (11/09 1400) BP: (93-156)/(44-77) 128/60 (11/09 1400) SpO2:  [82 %-100 %] 97 % (11/09 1400) Arterial Line BP: (99-210)/(38-73) 179/55 (11/09 1400) FiO2 (%):  [40 %] 40 % (11/09 1336) Weight:  [88.2 kg] 88.2 kg (11/09 0500)  NEURO:  Patient is sedated on propofol and intubated  Eyes are closed.  Opens eyes to voice and noxious stimulation. He grimaced to noxious stimulation Does not follow commands Pupils equal round and reactive,Ocular cephalic reflex intact Face appears symmetrical Positive cough/gag reflex Motor: Moves all extremity spontaneously but. Left side much less then right side  Withdraws lower extremities to noxious stimulation Toes are upgoing  Gait : not tested    Medications  Current Facility-Administered Medications:    0.9 %  sodium chloride infusion, 250 mL, Intravenous, Continuous, Roddenberry, Myron G, PA-C   0.9 %  sodium chloride infusion, , Intravenous, PRN, [name], MD, Stopped at 09/22/22 0857   acetaminophen (TYLENOL) tablet 1,000 mg, 1,000 mg, Oral, Q6H, 1,000 mg at 09/21/22 0534 **OR** acetaminophen (TYLENOL) 160 MG/5ML solution 1,000 mg, 1,000 mg, Per Tube, Q6H, Roddenberry, Myron G, PA-C, 1,000 mg at 09/22/22 1201   acetaminophen (TYLENOL)  160 MG/5ML solution 650 mg, 650 mg, Per Tube, Once **OR** acetaminophen (TYLENOL) suppository 650 mg, 650 mg, Rectal, Once, Roddenberry, Myron G, PA-C   amiodarone (NEXTERONE) 1.8 mg/mL load via infusion 150 mg, 150 mg, Intravenous, Once **FOLLOWED BY** [EXPIRED] amiodarone (NEXTERONE PREMIX) 360-4.14 MG/200ML-% (1.8 mg/mL) IV infusion, 60 mg/hr, Intravenous, Continuous, Stopped at 09/18/22 0340 **FOLLOWED BY** amiodarone (NEXTERONE PREMIX) 360-4.14 MG/200ML-% (1.8 mg/mL) IV infusion, 30 mg/hr, Intravenous, Continuous, Roddenberry, Myron G, PA-C, Last Rate: 16.67 mL/hr at 09/22/22 1400, 30 mg/hr at 09/22/22 1400   bisacodyl (DULCOLAX) EC tablet 10 mg, 10 mg, Oral, Daily **OR** bisacodyl (DULCOLAX) suppository 10 mg, 10 mg, Rectal, Daily, Roddenberry, Myron G, PA-C, 10 mg at 09/22/22 0900   Chlorhexidine Gluconate Cloth 2 % PADS 6 each, 6 each, Topical, Daily, [name], MD, 6 each at 09/22/22 1403   clonazePAM (KLONOPIN) disintegrating tablet 0.25 mg, 0.25 mg, Per Tube, BID, Noemi Chapel P, DO, 0.25 mg at 09/22/22 0900   docusate (COLACE) 50 MG/5ML liquid 200 mg, 200 mg, Per Tube, Daily, Roddenberry, Myron G, PA-C, 200 mg at 09/22/22 0900   famotidine (PEPCID) tablet 20 mg, 20 mg, Per Tube, Daily, [name], MD, 20 mg at 09/22/22 0900   feeding supplement (PROSource TF20) liquid 60 mL, 60 mL, Per Tube, BID, Clark, Laura P, DO, 60 mL at 09/22/22 0900   feeding supplement (VITAL 1.5 CAL) liquid 1,000 mL, 1,000 mL, Per Tube, Continuous, [name], MD, Last Rate:  50 mL/hr at 09/22/22 1400, Infusion Verify at 09/22/22 1400   fentaNYL (SUBLIMAZE) injection 25-50 mcg, 25-50 mcg, Intravenous, Q2H PRN, Steffanie Dunn, DO, 50 mcg at 09/21/22 1556   insulin aspart (novoLOG) injection 0-24 Units, 0-24 Units, Subcutaneous, Q4H, Loreli Slot, MD, 2 Units at 09/22/22 1201   insulin detemir (LEVEMIR) injection 12 Units, 12 Units, Subcutaneous, BID, Loreli Slot, MD,  12 Units at 09/22/22 0900   lactated ringers infusion, , Intravenous, Continuous, Roddenberry, Myron G, PA-C   lactated ringers infusion, , Intravenous, Continuous, Roddenberry, Myron G, PA-C, Stopped at 09/20/22 1403   levalbuterol (XOPENEX) nebulizer solution 0.63 mg, 0.63 mg, Nebulization, TID, Loreli Slot, MD, 0.63 mg at 09/22/22 1335   metoprolol tartrate (LOPRESSOR) tablet 12.5 mg, 12.5 mg, Oral, BID, 12.5 mg at 09/19/22 2219 **OR** metoprolol tartrate (LOPRESSOR) 25 mg/10 mL oral suspension 12.5 mg, 12.5 mg, Per Tube, BID, Roddenberry, Myron G, PA-C, 12.5 mg at 09/20/22 0942   metoprolol tartrate (LOPRESSOR) injection 2.5-5 mg, 2.5-5 mg, Intravenous, Q2H PRN, Roddenberry, Myron G, PA-C   mupirocin ointment (BACTROBAN) 2 % 1 Application, 1 Application, Nasal, BID, Loreli Slot, MD, 1 Application at 09/22/22 0901   nicardipine (CARDENE) 20mg  in 0.86% saline IV infusion (0.1 mg/ml), 3-15 mg/hr, Intravenous, Continuous, , MD, Stopped at 09/19/22 1647   ondansetron (ZOFRAN) injection 4 mg, 4 mg, Intravenous, Q6H PRN, Roddenberry, Myron G, PA-C   Oral care mouth rinse, 15 mL, Mouth Rinse, Q2H, 04-21-1969, MD, 15 mL at 09/22/22 1311   Oral care mouth rinse, 15 mL, Mouth Rinse, PRN, 13/09/23, MD   oxyCODONE (Oxy IR/ROXICODONE) immediate release tablet 5-10 mg, 5-10 mg, Oral, Q3H PRN, Roddenberry, Myron G, PA-C, 10 mg at 09/21/22 1705   phenylephrine (NEO-SYNEPHRINE) 20mg /NS 13/08/23 premix infusion, 0-100 mcg/min, Intravenous, Titrated, , MD, Stopped at 09/20/22 0735   QUEtiapine (SEROQUEL) tablet 12.5 mg, 12.5 mg, Per Tube, QHS, Alleen Borne P, DO   sodium chloride flush (NS) 0.9 % injection 10-40 mL, 10-40 mL, Intracatheter, Q12H, Karie Fetch, MD, 10 mL at 09/22/22 0900   sodium chloride flush (NS) 0.9 % injection 10-40 mL, 10-40 mL, Intracatheter, PRN, 13/09/23, MD   sodium chloride flush (NS)  0.9 % injection 3 mL, 3 mL, Intravenous, Q12H, Roddenberry, Myron G, PA-C, 3 mL at 09/22/22 0901   sodium chloride flush (NS) 0.9 % injection 3 mL, 3 mL, Intravenous, PRN, Roddenberry, Myron G, PA-C   sodium chloride HYPERTONIC 3 % nebulizer solution 4 mL, 4 mL, Nebulization, TID, 09-07-1971 P, DO, 4 mL at 09/22/22 1335   traMADol (ULTRAM) tablet 50-100 mg, 50-100 mg, Oral, Q4H PRN, Karie Fetch Labs CBC    Component Value Date/Time   WBC 9.4 09/22/2022 0422   RBC 2.60 (L) 09/22/2022 0422   HGB 7.6 (L) 09/22/2022 0422   HGB 13.8 05/28/2020 0819   HCT 23.3 (L) 09/22/2022 0422   HCT 41.5 05/28/2020 0819   PLT 60 (L) 09/22/2022 0422   PLT 151 05/28/2020 0819   MCV 89.6 09/22/2022 0422   MCV 84 05/28/2020 0819   MCH 29.2 09/22/2022 0422   MCHC 32.6 09/22/2022 0422   RDW 15.5 09/22/2022 0422   RDW 12.8 05/28/2020 0819   LYMPHSABS 2.3 09/17/2022 1722   LYMPHSABS 1.7 05/28/2020 0819   MONOABS 1.1 (H) 09/17/2022 1722   EOSABS 0.2 09/17/2022 1722   EOSABS 0.2 05/28/2020 0819   BASOSABS 0.1 09/17/2022 1722  BASOSABS 0.1 05/28/2020 0819    CMP     Component Value Date/Time   NA 147 (H) 09/22/2022 0927   K 4.2 09/22/2022 0927   CL 109 09/22/2022 0927   CO2 29 09/22/2022 0927   GLUCOSE 162 (H) 09/22/2022 0927   BUN 50 (H) 09/22/2022 0927   CREATININE 2.01 (H) 09/22/2022 0927   CALCIUM 8.1 (L) 09/22/2022 0927   PROT 5.1 (L) 09/22/2022 0927   PROT 6.3 05/28/2020 0819   ALBUMIN 2.6 (L) 09/22/2022 0927   ALBUMIN 3.8 05/28/2020 0819   AST 17 09/22/2022 0927   ALT 7 09/22/2022 0927   ALKPHOS 38 09/22/2022 0927   BILITOT 0.9 09/22/2022 0927   BILITOT 0.6 05/28/2020 0819   GFRNONAA 33 (L) 09/22/2022 0927   GFRAA >60 05/05/2020 0430    glycosylated hemoglobin  Lipid Panel     Component Value Date/Time   CHOL 82 09/19/2022 0528   CHOL 146 05/28/2020 0819   TRIG 92 09/19/2022 0528   HDL 22 (L) 09/19/2022 0528   HDL 43 05/28/2020 0819   CHOLHDL 3.7 09/19/2022  0528   VLDL 18 09/19/2022 0528   LDLCALC 42 09/19/2022 0528   LDLCALC 87 05/28/2020 0819     Imaging I have reviewed images in epic and the results pertinent to this consultation are:  CT-scan of the brain COMPARISON:  09/19/2022 10:44 a.m.   FINDINGS: Brain: Redemonstrated hypodensity in the right MCA territory, including foci in the right precentral gyrus (series 3, image 30) and left caudate/corona radiata (series 3, image 23), as well as possibly the right cerebellum (series 3, image 10). Redemonstrated focus of hemorrhage associated with an anterior right frontal lobe infarct (series 3, image 23). No evidence of increasing intraparenchymal hemorrhage. No evidence of additional acute infarct, mass, mass effect, or midline shift. No hydrocephalus or extra-axial fluid collection.   Vascular: No definite hyperdense vessel.   Skull: Normal. Negative for fracture or focal lesion.   Sinuses/Orbits: No acute finding. Status post bilateral lens replacements.   Other: The mastoid air cells are well aerated.   IMPRESSION: Redemonstrated infarcts in the right MCA territory, including foci in the right precentral gyrus and left caudate/corona radiata, as well as a hemorrhagic focus in the anterior right frontal lobe. No evidence of increasing intraparenchymal hemorrhage.  CTA Head/Neck: 09/19/22  FINDINGS: CTA NECK FINDINGS   Aortic arch: The aortic arch, including the arch vessel origins, was incompletely imaged. The previously shown aortic dissection is partially covered in the distal aspect of the arch. The dissection is again noted to extend into the included portion of the brachiocephalic artery and proximal right subclavian artery, with the false lumen now being opacified as opposed to having a thrombosed appearance on the prior CTA and with decreased narrowing of the true lumen. The dissection flap also slightly extends into the origin of the right common carotid  artery.   Right carotid system: Patent without evidence of stenosis. Minimal atheromatous irregularity.   Left carotid system: Patent without evidence of stenosis or dissection, although the common carotid origin was not imaged. Minimal atheromatous irregularity.   Vertebral arteries: Patent without evidence of a significant stenosis or dissection within limitations of dental streak artifact.   Skeleton: Mild cervical spondylosis.   Other neck: Thyroidectomy.  Right internal jugular venous catheter.   Upper chest: Partially visualized right larger than left pleural effusions with compressive atelectasis. Endotracheal tube terminating at the level of the clavicular heads. Partially visualized enteric tube.   Review  of the MIP images confirms the above findings   CTA HEAD FINDINGS   Anterior circulation: The internal carotid arteries are patent from skull base to carotid termini with mild atherosclerotic plaque bilaterally not resulting in significant stenosis. There are broad-based 2 mm aneurysm is projecting laterally from the left cavernous ICA and right supraclinoid ICA. ACAs and MCAs are patent without evidence of a proximal branch occlusion or significant proximal stenosis. Mild generalized ectatic appearance of the intracranial ICAs, ACAs, and MCAs diffusely.   Posterior circulation: The intracranial vertebral arteries are patent to the basilar. Patent PICA, AICA, and SCA origins are seen bilaterally. The basilar artery is widely patent. There is a large left posterior communicating artery with absence of the left P1 segment. Both PCAs are patent without evidence of a significant proximal stenosis. No aneurysm is identified.   Venous sinuses: Patent.   Anatomic variants: Fetal left PCA.   Review of the MIP images confirms the above findings   IMPRESSION: 1. No large vessel occlusion. 2. Partially visualized known aortic dissection extending into  the brachiocephalic artery, proximal right subclavian artery, and origin of the right common carotid artery. 3. Mild intracranial and cervical carotid atherosclerosis without significant proximal stenosis. 4. Small bilateral ICA aneurysms. 5. Bilateral pleural effusions.  EEG: 09/20/22 Date: 09/19/2022 Duration: 24.24 mins   Patient history: 81 year old male with right gaze deviation left-sided weakness.  EEG to evaluate for seizure.   Level of alertness: lethargic    AEDs during EEG study: None   Technical aspects: This EEG study was done with scalp electrodes positioned according to the 10-20 International system of electrode placement. Electrical activity was reviewed with band pass filter of 1-70Hz , sensitivity of 7 uV/mm, display speed of 47mm/sec with a 60Hz  notched filter applied as appropriate. EEG data were recorded continuously and digitally stored.  Video monitoring was available and reviewed as appropriate.   Description: EEG showed continuous generalized predominantly 5-9Hz  theta-alpha activity admixed with intermittent generalized 2-3Hz  delta slowing. Hyperventilation and photic stimulation were not performed.      ABNORMALITY - Continuous slow, generalized   IMPRESSION: This study is suggestive of moderate to severe diffuse encephalopathy, nonspecific etiology. No seizures or epileptiform discharges were seen throughout the recording.  Assessment:  81 year old male with a history of AAA s/p repair, presenting with acute onset of severe left worse than RLE weakness in conjunction with left flank/back pain resulting in collapse at home. STAT CT head on presentation was with no acute abnormality. 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  He also developed transient postop atrial fibrillation but reverted to sinus rhythm after starting  amiodarone drip.  Patient is still intubated  and current stroke is secondary to current cardiac surgery. 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 Perioperative  s/p aortic arch repair and transient A-fib Recommendations:  Neuro checks per unit protocol EEG completed this am  Stat CT HEAD with any acute neurological changes Hold Antiplatelet  and anticoagulation MRI Brain needed to assess additional strokes not seen on CT SCAN . Its held at this timr secondary to epicardial pacing wires. This can be arrangedt later . Did discuss with patients family  -- Patient remains intubated for respiratory failure but seems like he is doing better and may get a trial of  extubation later today.  Continues to minimize sedation.  Recommend repeat CT scan of the head without contrast as patient's left hemiparesis appears more pronounced today.  Extubate as tolerated per cardiothoracic surgery and critical care team.     Long discussion with patient's   daughter at the bedside and answered questions.  Discussed with Dr. Ernest Mallick critical care MD.  This patient is critically ill and at significant risk of neurological worsening, death and care requires constant monitoring of vital signs, hemodynamics,respiratory and cardiac monitoring, extensive review of multiple databases, frequent neurological assessment, discussion with family, other specialists and medical decision making of high complexity.I have made any additions or clarifications directly to the above note.This critical care time does not reflect procedure time, or teaching time or supervisory time of PA/NP/Med Resident etc but could involve care discussion time.  I spent 30 minutes of neurocritical care time  in the care of  this patient.     Antony Contras, MD Medical Director [facility] Pager: [phone] 09/22/2022 2:04 PM

## 2022-09-22 NOTE — Procedures (Signed)
Extubation Procedure Note  Patient Details:   Name: James Estrada DOB: 12/21/1940 MRN: [id number]   Airway Documentation:  + cuff leak test prior to extubation. Vent end date: 09/22/22 Vent end time: 1820   Evaluation  O2 sats: stable throughout Complications: No apparent complications Patient did tolerate procedure well. Bilateral Breath Sounds: Diminished, Clear   No pt does not speak.  No stridor noted, no distress noted, sat 99% currently    Jennette Kettle 09/22/2022, 6:22 PM

## 2022-09-23 ENCOUNTER — Inpatient Hospital Stay (HOSPITAL_COMMUNITY): Payer: Medicare Other

## 2022-09-23 ENCOUNTER — Inpatient Hospital Stay: Payer: Self-pay

## 2022-09-23 DIAGNOSIS — I5030 Unspecified diastolic (congestive) heart failure: Secondary | ICD-10-CM | POA: Diagnosis not present

## 2022-09-23 DIAGNOSIS — Z9889 Other specified postprocedural states: Secondary | ICD-10-CM

## 2022-09-23 DIAGNOSIS — I110 Hypertensive heart disease with heart failure: Secondary | ICD-10-CM | POA: Diagnosis not present

## 2022-09-23 DIAGNOSIS — I480 Paroxysmal atrial fibrillation: Secondary | ICD-10-CM | POA: Diagnosis not present

## 2022-09-23 LAB — GLUCOSE, CAPILLARY
Glucose-Capillary: 98 mg/dL (ref 70–99)
Glucose-Capillary: 128 mg/dL — ABNORMAL HIGH (ref 70–99)
Glucose-Capillary: 119 mg/dL — ABNORMAL HIGH (ref 70–99)
Glucose-Capillary: 165 mg/dL — ABNORMAL HIGH (ref 70–99)
Glucose-Capillary: 153 mg/dL — ABNORMAL HIGH (ref 70–99)
Glucose-Capillary: 162 mg/dL — ABNORMAL HIGH (ref 70–99)

## 2022-09-23 LAB — CBC
HCT: 26 % — ABNORMAL LOW (ref 39.0–52.0)
Hemoglobin: 8.3 g/dL — ABNORMAL LOW (ref 13.0–17.0)
MCH: 28.9 pg (ref 26.0–34.0)
MCHC: 31.9 g/dL (ref 30.0–36.0)
MCV: 90.6 fL (ref 80.0–100.0)
Platelets: 78 10*3/uL — ABNORMAL LOW (ref 150–400)
RBC: 2.87 MIL/uL — ABNORMAL LOW (ref 4.22–5.81)
RDW: 15.3 % (ref 11.5–15.5)
WBC: 14 10*3/uL — ABNORMAL HIGH (ref 4.0–10.5)
nRBC: 0 % (ref 0.0–0.2)

## 2022-09-23 LAB — BASIC METABOLIC PANEL
Anion gap: 11 (ref 5–15)
BUN: 43 mg/dL — ABNORMAL HIGH (ref 8–23)
CO2: 30 mmol/L (ref 22–32)
Calcium: 8.6 mg/dL — ABNORMAL LOW (ref 8.9–10.3)
Chloride: 108 mmol/L (ref 98–111)
Creatinine, Ser: 1.44 mg/dL — ABNORMAL HIGH (ref 0.61–1.24)
GFR, Estimated: 50 mL/min — ABNORMAL LOW (ref >60)
Glucose, Bld: 121 mg/dL — ABNORMAL HIGH (ref 70–99)
Potassium: 4 mmol/L (ref 3.5–5.1)
Sodium: 149 mmol/L — ABNORMAL HIGH (ref 135–145)

## 2022-09-23 LAB — CULTURE, RESPIRATORY W GRAM STAIN

## 2022-09-23 MED ORDER — ASPIRIN 81 MG PO CHEW
81.0000 mg | CHEWABLE_TABLET | Freq: Every day | ORAL | Status: DC
  Administered 2022-09-23 – 2022-09-30 (×8): 81 mg
  Filled 2022-09-23 (×8): qty 1

## 2022-09-23 MED ORDER — PROSOURCE TF20 ENFIT COMPATIBL EN LIQD
60.0000 mL | Freq: Every day | ENTERAL | Status: DC
  Administered 2022-09-24 – 2022-09-29 (×6): 60 mL
  Filled 2022-09-23 (×6): qty 60

## 2022-09-23 MED ORDER — AMIODARONE HCL 200 MG PO TABS
200.0000 mg | ORAL_TABLET | Freq: Two times a day (BID) | ORAL | Status: DC
  Administered 2022-09-23 – 2022-09-26 (×8): 200 mg
  Filled 2022-09-23 (×8): qty 1

## 2022-09-23 MED ORDER — ORAL CARE MOUTH RINSE: 15.0000 mL | OROMUCOSAL | Status: DC | PRN

## 2022-09-23 MED ORDER — FREE WATER
200.0000 mL | Freq: Four times a day (QID) | Status: DC
  Administered 2022-09-23 – 2022-09-24 (×4): 200 mL

## 2022-09-23 MED ORDER — ASPIRIN 81 MG PO CHEW: 81.0000 mg | CHEWABLE_TABLET | Freq: Every day | ORAL | Status: DC

## 2022-09-23 NOTE — Progress Notes (Signed)
Patient ID: James Estrada, male   DOB: 10-18-1941, 81 y.o.   MRN: [id number] TCTS Evening Rounds:  Hemodynamically stable   Sats 97% on 4L.  UO ok  Not following commands.

## 2022-09-23 NOTE — Procedures (Signed)
Cortrak  Tube Type:  Cortrak - 43 inches Tube Location:  Left nare Initial Placement:  Stomach Secured by: Bridle Technique Used to Measure Tube Placement:  Marking at nare/corner of mouth Cortrak Secured At:  84 cm   Cortrak Tube Team Note:  Consult received to place a Cortrak feeding tube.   X-ray is required, abdominal x-ray has been ordered by the Cortrak team. Please confirm tube placement before using the Cortrak tube.   If the tube becomes dislodged please keep the tube and contact the Cortrak team at [email or web address] for replacement.  If after hours and replacement cannot be delayed, place a NG tube and confirm placement with an abdominal x-ray.    Betsey Holiday MS, RD, LDN Please refer to [facility] for RD and/or RD on-call/weekend/after hours pager

## 2022-09-23 NOTE — Progress Notes (Signed)
Neurology Progress Note   S:// Patient was extubated yesterday and seems to be breathing all right.  He is speaking daily with few words not following commands consistently.He still has significant left hemiplegia.  CT scan of the head done yesterday shows expected evolutionary changes in the right cerebral infarct without any new infarct or hemorrhage Unable to obtain MRI BRAN. Due to  pacing wires  O:// Current vital signs: BP 130/67   Pulse (!) 58   Temp 98.5 F (36.9 C) (Oral)   Resp (!) 23   Ht 6' (1.829 m)   Wt 82.8 kg   SpO2 (!) 88%   BMI 24.76 kg/m  Vital signs in last 24 hours: Temp:  [98.2 F (36.8 C)-99.1 F (37.3 C)] 98.5 F (36.9 C) (11/10 0802) Pulse Rate:  [49-74] 58 (11/10 1100) Resp:  [15-31] 23 (11/10 1100) BP: (111-157)/(58-95) 130/67 (11/10 1045) SpO2:  [86 %-100 %] 88 % (11/10 1100) Arterial Line BP: (129-179)/(50-74) 152/62 (11/10 1100) FiO2 (%):  [40 %] 40 % (11/09 1552) Weight:  [82.8 kg] 82.8 kg (11/10 0500)  NEURO:  Patient is awake but not very interactive.  He speaks only few words and short sentences will not follow commands consistently. Pupils equal round and reactive,Ocular cephalic reflex intact Face appears symmetrical Positive cough/gag reflex Motor: Moves all extremity spontaneously but. Left side much less then right side  Withdraws lower extremities to noxious stimulation Toes are upgoing  Gait : not tested    Medications  Current Facility-Administered Medications:    0.9 %  sodium chloride infusion, 250 mL, Intravenous, Continuous, Roddenberry, Myron G, PA-C   0.9 %  sodium chloride infusion, , Intravenous, PRN, [name], MD, Last Rate: 10 mL/hr at 09/23/22 1100, Infusion Verify at 09/23/22 1100   acetaminophen (TYLENOL) tablet 1,000 mg, 1,000 mg, Oral, Q6H **OR** acetaminophen (TYLENOL) 160 MG/5ML solution 1,000 mg, 1,000 mg, Per Tube, Q6H, Kaleen Mask, RPH   acetaminophen (TYLENOL) 160 MG/5ML solution 650 mg,  650 mg, Per Tube, Once **OR** acetaminophen (TYLENOL) suppository 650 mg, 650 mg, Rectal, Once, Roddenberry, Myron G, PA-C   acetaminophen (TYLENOL) suppository 650 mg, 650 mg, Rectal, Q4H, Kaleen Mask, RPH, 650 mg at 09/23/22 0819   amiodarone (NEXTERONE) 1.8 mg/mL load via infusion 150 mg, 150 mg, Intravenous, Once **FOLLOWED BY** [EXPIRED] amiodarone (NEXTERONE PREMIX) 360-4.14 MG/200ML-% (1.8 mg/mL) IV infusion, 60 mg/hr, Intravenous, Continuous, Stopped at 09/18/22 0340 **FOLLOWED BY** amiodarone (NEXTERONE PREMIX) 360-4.14 MG/200ML-% (1.8 mg/mL) IV infusion, 30 mg/hr, Intravenous, Continuous, Roddenberry, Myron G, PA-C, Last Rate: 16.67 mL/hr at 09/23/22 1100, 30 mg/hr at 09/23/22 1100   bisacodyl (DULCOLAX) EC tablet 10 mg, 10 mg, Oral, Daily **OR** bisacodyl (DULCOLAX) suppository 10 mg, 10 mg, Rectal, Daily, Roddenberry, Myron G, PA-C, 10 mg at 09/22/22 0900   Chlorhexidine Gluconate Cloth 2 % PADS 6 each, 6 each, Topical, Daily, [name], MD, 6 each at 09/23/22 [id number]   clonazePAM (KLONOPIN) disintegrating tablet 0.25 mg, 0.25 mg, Per Tube, BID, Noemi Chapel P, DO, 0.25 mg at 09/22/22 0900   docusate (COLACE) 50 MG/5ML liquid 200 mg, 200 mg, Per Tube, Daily, Roddenberry, Myron G, PA-C, 200 mg at 09/22/22 0900   famotidine (PEPCID) IVPB 20 mg premix, 20 mg, Intravenous, Q24H, Anders Simmonds, MD, Stopped at 09/22/22 2307   feeding supplement (PROSource TF20) liquid 60 mL, 60 mL, Per Tube, BID, Clark, Laura P, DO, 60 mL at 09/22/22 0900   feeding supplement (VITAL 1.5 CAL) liquid 1,000 mL, 1,000  mL, Per Tube, Continuous, [name], MD, Stopped at 09/22/22 1815   fentaNYL (SUBLIMAZE) injection 25-50 mcg, 25-50 mcg, Intravenous, Q2H PRN, Julian Hy, DO, 50 mcg at 09/21/22 1556   insulin aspart (novoLOG) injection 0-24 Units, 0-24 Units, Subcutaneous, Q4H, [name], MD, 2 Units at 09/23/22 0818   lactated ringers infusion, , Intravenous, Continuous,  Roddenberry, Myron G, PA-C   lactated ringers infusion, , Intravenous, Continuous, Roddenberry, Myron G, PA-C, Stopped at 09/20/22 1403   levalbuterol (XOPENEX) nebulizer solution 0.63 mg, 0.63 mg, Nebulization, TID, [name], MD, 0.63 mg at 09/23/22 0736   metoprolol tartrate (LOPRESSOR) tablet 12.5 mg, 12.5 mg, Oral, BID, 12.5 mg at 09/19/22 2219 **OR** metoprolol tartrate (LOPRESSOR) 25 mg/10 mL oral suspension 12.5 mg, 12.5 mg, Per Tube, BID, Roddenberry, Myron G, PA-C, 12.5 mg at 09/20/22 0942   metoprolol tartrate (LOPRESSOR) injection 2.5-5 mg, 2.5-5 mg, Intravenous, Q2H PRN, Roddenberry, Myron G, PA-C   ondansetron (ZOFRAN) injection 4 mg, 4 mg, Intravenous, Q6H PRN, Roddenberry, Myron G, PA-C   Oral care mouth rinse, 15 mL, Mouth Rinse, PRN, [name], MD   Oral care mouth rinse, 15 mL, Mouth Rinse, PRN, [name], MD   oxyCODONE (Oxy IR/ROXICODONE) immediate release tablet 5-10 mg, 5-10 mg, Oral, Q3H PRN, Roddenberry, Myron G, PA-C, 10 mg at 09/21/22 1705   QUEtiapine (SEROQUEL) tablet 12.5 mg, 12.5 mg, Per Tube, QHS, Noemi Chapel P, DO   sodium chloride flush (NS) 0.9 % injection 10-40 mL, 10-40 mL, Intracatheter, Q12H, [name], MD, 10 mL at 09/23/22 0953   sodium chloride flush (NS) 0.9 % injection 10-40 mL, 10-40 mL, Intracatheter, PRN, [name], MD   sodium chloride flush (NS) 0.9 % injection 3 mL, 3 mL, Intravenous, Q12H, Roddenberry, Myron G, PA-C, 3 mL at 09/23/22 0953   sodium chloride flush (NS) 0.9 % injection 3 mL, 3 mL, Intravenous, PRN, Roddenberry, Myron G, PA-C   sodium chloride HYPERTONIC 3 % nebulizer solution 4 mL, 4 mL, Nebulization, TID, Noemi Chapel P, DO, 4 mL at 09/23/22 0736   traMADol (ULTRAM) tablet 50-100 mg, 50-100 mg, Oral, Q4H PRN, Antony Odea, PA-C Labs CBC    Component Value Date/Time   WBC 14.0 (H) 09/23/2022 0418   RBC 2.87 (L) 09/23/2022 0418   HGB 8.3 (L) 09/23/2022 0418   HGB  13.8 05/28/2020 0819   HCT 26.0 (L) 09/23/2022 0418   HCT 41.5 05/28/2020 0819   PLT 78 (L) 09/23/2022 0418   PLT 151 05/28/2020 0819   MCV 90.6 09/23/2022 0418   MCV 84 05/28/2020 0819   MCH 28.9 09/23/2022 0418   MCHC 31.9 09/23/2022 0418   RDW 15.3 09/23/2022 0418   RDW 12.8 05/28/2020 0819   LYMPHSABS 2.3 09/17/2022 1722   LYMPHSABS 1.7 05/28/2020 0819   MONOABS 1.1 (H) 09/17/2022 1722   EOSABS 0.2 09/17/2022 1722   EOSABS 0.2 05/28/2020 0819   BASOSABS 0.1 09/17/2022 1722   BASOSABS 0.1 05/28/2020 0819    CMP     Component Value Date/Time   NA 149 (H) 09/23/2022 0418   K 4.0 09/23/2022 0418   CL 108 09/23/2022 0418   CO2 30 09/23/2022 0418   GLUCOSE 121 (H) 09/23/2022 0418   BUN 43 (H) 09/23/2022 0418   CREATININE 1.44 (H) 09/23/2022 0418   CALCIUM 8.6 (L) 09/23/2022 0418   PROT 5.1 (L) 09/22/2022 0927   PROT 6.3 05/28/2020 0819   ALBUMIN 2.6 (L) 09/22/2022 [id number]  ALBUMIN 3.8 05/28/2020 0819   AST 17 09/22/2022 0927   ALT 7 09/22/2022 0927   ALKPHOS 38 09/22/2022 0927   BILITOT 0.9 09/22/2022 0927   BILITOT 0.6 05/28/2020 0819   GFRNONAA 50 (L) 09/23/2022 0418   GFRAA >60 05/05/2020 0430    glycosylated hemoglobin  Lipid Panel     Component Value Date/Time   CHOL 82 09/19/2022 0528   CHOL 146 05/28/2020 0819   TRIG 92 09/19/2022 0528   HDL 22 (L) 09/19/2022 0528   HDL 43 05/28/2020 0819   CHOLHDL 3.7 09/19/2022 0528   VLDL 18 09/19/2022 0528   LDLCALC 42 09/19/2022 0528   LDLCALC 87 05/28/2020 0819     Imaging I have reviewed images in epic and the results pertinent to this consultation are:  CT-scan of the brain COMPARISON:  09/19/2022 10:44 a.m.   FINDINGS: Brain: Redemonstrated hypodensity in the right MCA territory, including foci in the right precentral gyrus (series 3, image 30) and left caudate/corona radiata (series 3, image 23), as well as possibly the right cerebellum (series 3, image 10). Redemonstrated focus of hemorrhage  associated with an anterior right frontal lobe infarct (series 3, image 23). No evidence of increasing intraparenchymal hemorrhage. No evidence of additional acute infarct, mass, mass effect, or midline shift. No hydrocephalus or extra-axial fluid collection.   Vascular: No definite hyperdense vessel.   Skull: Normal. Negative for fracture or focal lesion.   Sinuses/Orbits: No acute finding. Status post bilateral lens replacements.   Other: The mastoid air cells are well aerated.   IMPRESSION: Redemonstrated infarcts in the right MCA territory, including foci in the right precentral gyrus and left caudate/corona radiata, as well as a hemorrhagic focus in the anterior right frontal lobe. No evidence of increasing intraparenchymal hemorrhage.  CTA Head/Neck: 09/19/22  FINDINGS: CTA NECK FINDINGS   Aortic arch: The aortic arch, including the arch vessel origins, was incompletely imaged. The previously shown aortic dissection is partially covered in the distal aspect of the arch. The dissection is again noted to extend into the included portion of the brachiocephalic artery and proximal right subclavian artery, with the false lumen now being opacified as opposed to having a thrombosed appearance on the prior CTA and with decreased narrowing of the true lumen. The dissection flap also slightly extends into the origin of the right common carotid artery.   Right carotid system: Patent without evidence of stenosis. Minimal atheromatous irregularity.   Left carotid system: Patent without evidence of stenosis or dissection, although the common carotid origin was not imaged. Minimal atheromatous irregularity.   Vertebral arteries: Patent without evidence of a significant stenosis or dissection within limitations of dental streak artifact.   Skeleton: Mild cervical spondylosis.   Other neck: Thyroidectomy.  Right internal jugular venous catheter.   Upper chest: Partially  visualized right larger than left pleural effusions with compressive atelectasis. Endotracheal tube terminating at the level of the clavicular heads. Partially visualized enteric tube.   Review of the MIP images confirms the above findings   CTA HEAD FINDINGS   Anterior circulation: The internal carotid arteries are patent from skull base to carotid termini with mild atherosclerotic plaque bilaterally not resulting in significant stenosis. There are broad-based 2 mm aneurysm is projecting laterally from the left cavernous ICA and right supraclinoid ICA. ACAs and MCAs are patent without evidence of a proximal branch occlusion or significant proximal stenosis. Mild generalized ectatic appearance of the intracranial ICAs, ACAs, and MCAs diffusely.   Posterior circulation: The  intracranial vertebral arteries are patent to the basilar. Patent PICA, AICA, and SCA origins are seen bilaterally. The basilar artery is widely patent. There is a large left posterior communicating artery with absence of the left P1 segment. Both PCAs are patent without evidence of a significant proximal stenosis. No aneurysm is identified.   Venous sinuses: Patent.   Anatomic variants: Fetal left PCA.   Review of the MIP images confirms the above findings   IMPRESSION: 1. No large vessel occlusion. 2. Partially visualized known aortic dissection extending into the brachiocephalic artery, proximal right subclavian artery, and origin of the right common carotid artery. 3. Mild intracranial and cervical carotid atherosclerosis without significant proximal stenosis. 4. Small bilateral ICA aneurysms. 5. Bilateral pleural effusions.  EEG: 09/20/22 Date: 09/19/2022 Duration: 24.24 mins   Patient history: 81 year old male with right gaze deviation left-sided weakness.  EEG to evaluate for seizure.   Level of alertness: lethargic    AEDs during EEG study: None   Technical aspects: This EEG study was done  with scalp electrodes positioned according to the 10-20 International system of electrode placement. Electrical activity was reviewed with band pass filter of 1-70Hz , sensitivity of 7 uV/mm, display speed of 83mm/sec with a 60Hz  notched filter applied as appropriate. EEG data were recorded continuously and digitally stored.  Video monitoring was available and reviewed as appropriate.   Description: EEG showed continuous generalized predominantly 5-9Hz  theta-alpha activity admixed with intermittent generalized 2-3Hz  delta slowing. Hyperventilation and photic stimulation were not performed.      ABNORMALITY - Continuous slow, generalized   IMPRESSION: This study is suggestive of moderate to severe diffuse encephalopathy, nonspecific etiology. No seizures or epileptiform discharges were seen throughout the recording.  Assessment:  81 year old male with a history of AAA s/p repair, presenting with acute onset of severe left worse than RLE weakness in conjunction with left flank/back pain resulting in collapse at home. STAT CT head on presentation was with no acute abnormality. 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  He also developed transient postop atrial fibrillation but reverted to sinus rhythm after starting amiodarone drip.  Patient is still intubated  and current stroke is secondary to current cardiac surgery. 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 Perioperative  s/p aortic arch repair and transient A-fib   Patient has been extubated but remains poorly responsive and is on mild sedation.  Continue to minimize sedation.   Mobilize in bed as tolerated.  Physical Occupational Therapy and speech therapy consults.  Consider starting aspirin if okay with  cardiothoracic surgery.  This patient is critically ill and at significant risk of neurological worsening, death and care requires constant monitoring of vital signs, hemodynamics,respiratory and cardiac monitoring, extensive review of multiple databases, frequent neurological assessment, discussion with family, other specialists and medical decision making of high complexity.I have made any additions or clarifications directly to the above note.This critical care time does not reflect procedure time, or teaching time or supervisory time of PA/NP/Med Resident etc but could involve care discussion time.  I spent 30 minutes of neurocritical care time  in the care of  this patient.     78, MD Medical Director [facility] Pager: [phone] 09/23/2022 11:23 AM

## 2022-09-23 NOTE — Progress Notes (Signed)
Advanced Heart Failure Rounding Note  PCP-Cardiologist: None   Subjective:    POD 6 S/p type 1 Aortic dissection repair. POD#5  Head CT 11/6 showed small  acute cortical/subcortical infarcts within the right MCA vascular territory. 4 mm focus of hemorrhage associated with an infarct within the anterior right frontal lobe.  11/9 Extubated. On 4 liters.    Verbal earlier today but nonverbal for me. He is not following commands.   Objective:   Weight Range: 82.8 kg Body mass index is 24.76 kg/m.   Vital Signs:   Temp:  [98.2 F (36.8 C)-99.1 F (37.3 C)] 98.9 F (37.2 C) (11/10 0300) Pulse Rate:  [49-74] 62 (11/10 0615) Resp:  [15-31] 21 (11/10 0615) BP: (111-147)/(54-95) 135/72 (11/10 0615) SpO2:  [86 %-100 %] 99 % (11/10 0615) Arterial Line BP: (129-179)/(43-74) 137/59 (11/10 0615) FiO2 (%):  [40 %] 40 % (11/09 1552) Weight:  [82.8 kg] 82.8 kg (11/10 0500) Last BM Date : 09/21/22  Weight change: Filed Weights   09/21/22 0500 09/22/22 0500 09/23/22 0500  Weight: 92.7 kg 88.2 kg 82.8 kg    Intake/Output:   Intake/Output Summary (Last 24 hours) at 09/23/2022 0754 Last data filed at 09/23/2022 0600 Gross per 24 hour  Intake 1385.26 ml  Output 4020 ml  Net -2634.74 ml      Physical Exam   CVP 3 General: . No resp difficulty HEENT: normal Neck: supple. no JVD. Carotids 2+ bilat; no bruits. No lymphadenopathy or thryomegaly appreciated. Cor: PMI nondisplaced. Regular rate & rhythm. No rubs, gallops or murmurs. Lungs: clear on 4 liters Abdomen: soft, nontender, nondistended. No hepatosplenomegaly. No bruits or masses. Good bowel sounds. Extremities: no cyanosis, clubbing, rash, edema Neuro: alert , cranial nerves grossly intact. moves all 4 extremities .  He does not follow commands.   Telemetry   NSR 60s   EKG    No new EKG to review   Labs    CBC Recent Labs    09/22/22 0422 09/23/22 0418  WBC 9.4 14.0*  HGB 7.6* 8.3*  HCT 23.3* 26.0*   MCV 89.6 90.6  PLT 60* 78*   Basic Metabolic Panel Recent Labs    09/21/22 0522 09/21/22 1758 09/22/22 0422 09/22/22 0927 09/23/22 0418  NA 143  --    < > 147* 149*  K 3.1*  --    < > 4.2 4.0  CL 106  --    < > 109 108  CO2 28  --    < > 29 30  GLUCOSE 147*  --    < > 162* 121*  BUN 43*  --    < > 50* 43*  CREATININE 1.99*  --    < > 2.01* 1.44*  CALCIUM 7.5*  --    < > 8.1* 8.6*  MG 2.5* 2.6*  --   --   --   PHOS 3.3 3.6  --   --   --    < > = values in this interval not displayed.   Liver Function Tests Recent Labs    09/21/22 0522 09/22/22 0927  AST 22 17  ALT 9 7  ALKPHOS 32* 38  BILITOT 0.9 0.9  PROT 4.7* 5.1*  ALBUMIN 2.6* 2.6*   No results for input(s): "LIPASE", "AMYLASE" in the last 72 hours. Cardiac Enzymes No results for input(s): "CKTOTAL", "CKMB", "CKMBINDEX", "TROPONINI" in the last 72 hours.  BNP: BNP (last 3 results) No results for input(s): "BNP" in the last  8760 hours.  ProBNP (last 3 results) No results for input(s): "PROBNP" in the last 8760 hours.   D-Dimer No results for input(s): "DDIMER" in the last 72 hours.  Hemoglobin A1C No results for input(s): "HGBA1C" in the last 72 hours.  Fasting Lipid Panel No results for input(s): "CHOL", "HDL", "LDLCALC", "TRIG", "CHOLHDL", "LDLDIRECT" in the last 72 hours.  Thyroid Function Tests No results for input(s): "TSH", "T4TOTAL", "T3FREE", "THYROIDAB" in the last 72 hours.  Invalid input(s): "FREET3"  Other results:   Imaging    CT HEAD WO CONTRAST (5MM)  Result Date: 09/22/2022 CLINICAL DATA:  Stroke. EXAM: CT HEAD WITHOUT CONTRAST TECHNIQUE: Contiguous axial images were obtained from the base of the skull through the vertex without intravenous contrast. RADIATION DOSE REDUCTION: This exam was performed according to the departmental dose-optimization program which includes automated exposure control, adjustment of the mA and/or kV according to patient size and/or use of iterative  reconstruction technique. COMPARISON:  CT head 09/19/2022 FINDINGS: Brain: Multiple small areas of infarct in the right MCA territory appear slightly more prominent. The infarct in the right external capsule and corona radiata appears larger more hypodense. The infarct in the right frontal convexity cortex appears slightly larger. There is a small adjacent cortical infarct which was not seen previously. Small hypodensity in the right parietal lobe appears more prominent and likely is also acute infarct. Small hypodensity right occipital parietal lobe is more prominent. 4 mm focus of hyperdense hemorrhage in the right anterolateral frontal lobe is unchanged. No new hemorrhage. Generalized atrophy. Patchy white matter hypodensity bilaterally compatible chronic microvascular ischemia. Small hypodensity right cerebellum likely is a chronic infarct. Vascular: Negative for hyperdense vessel Skull: Negative Sinuses/Orbits: Paranasal sinuses clear.  Bilateral cataract surgery Other: None IMPRESSION: Scattered areas of acute infarct in the right MCA territory appear more prominent and slightly larger compared with 3 days ago. This is likely evolutionary change with edema in the infarct 4 mm focus of hemorrhage in the right anterolateral frontal lobe unchanged. No new hemorrhage. Electronically Signed   By: Franchot Gallo M.D.   On: 09/22/2022 16:07     Medications:     Scheduled Medications:  acetaminophen  1,000 mg Oral Q6H   Or   acetaminophen (TYLENOL) oral liquid 160 mg/5 mL  1,000 mg Per Tube Q6H   acetaminophen (TYLENOL) oral liquid 160 mg/5 mL  650 mg Per Tube Once   Or   acetaminophen  650 mg Rectal Once   acetaminophen  650 mg Rectal Q4H   amiodarone  150 mg Intravenous Once   bisacodyl  10 mg Oral Daily   Or   bisacodyl  10 mg Rectal Daily   Chlorhexidine Gluconate Cloth  6 each Topical Daily   clonazePAM  0.25 mg Per Tube BID   docusate  200 mg Per Tube Daily   feeding supplement (PROSource  TF20)  60 mL Per Tube BID   insulin aspart  0-24 Units Subcutaneous Q4H   levalbuterol  0.63 mg Nebulization TID   metoprolol tartrate  12.5 mg Oral BID   Or   metoprolol tartrate  12.5 mg Per Tube BID   mupirocin ointment  1 Application Nasal BID   QUEtiapine  12.5 mg Per Tube QHS   sodium chloride flush  10-40 mL Intracatheter Q12H   sodium chloride flush  3 mL Intravenous Q12H   sodium chloride HYPERTONIC  4 mL Nebulization TID    Infusions:  sodium chloride     sodium chloride 10  mL/hr at 09/23/22 0600   amiodarone 30 mg/hr (09/23/22 0600)   famotidine (PEPCID) IV Stopped (09/22/22 2307)   feeding supplement (VITAL 1.5 CAL) Stopped (09/22/22 1815)   lactated ringers     lactated ringers Stopped (09/20/22 1403)   niCARDipine Stopped (09/19/22 1647)   phenylephrine (NEO-SYNEPHRINE) Adult infusion Stopped (09/20/22 0735)    PRN Medications: sodium chloride, fentaNYL (SUBLIMAZE) injection, metoprolol tartrate, ondansetron (ZOFRAN) IV, mouth rinse, oxyCODONE, sodium chloride flush, sodium chloride flush, traMADol    Patient Profile   82 y/o male w/ h/o HTN, HLD, AAA s/p stent graft admitted w/ Type 1 Aortic Dissection, s/p emergent repair.   Assessment/Plan   1. Type 1 Aortic Dissection  - s/p emergent repair 11/4 - management per CT surgery  - holding ASA w/ thrombocytopenia    2. Hypertension  -Elevated. Allowing SBP 150s per Neurology.  - On metoprolol - parameters per CT surgery and Neuro    3. HFpEF - Intra-op TEE EF 60-65%, RV ok - As expected volume up, post large blood/ fluid resuscitation due to problem #1 + RBP.  - CVP down to 3 today. Hold  diuretics.   - GDMT limited by hypotension and AKI   4. ABLA/ Thrombocytopenia  - dissection w/ RBP + expected blood loss in OR - required multiple units of PRBC's, Plts, FFP, and Cryoprecipitate in OR - Hgb 8.3  today  - watch Plts, 78K - holding ASA    5. Post-Op PAF - amio gtt 30/hr  - On amio drip.  Could  switch po amio once Cortrak placed.  - keep K > 4.0 and Mg > 2.0    6. Pericardial Effusion  - mod by Intra-op TEE, no tamponade - hemodynamics stable - continue diuresis      7. Acute Hypoxic Respiratory Failure - Extubated 11/9 to 4 liters [place].    8. Rt Sided Strokes   - CT head 11/6 showed 3 small right-sided strokes 1 in the cerebellum 1 in the right basal ganglia and 1 in the frontal lobe with a 4 mm area of bleeding  - following commands  - neurology following  -  hold off on antiplatelet/AC.Platelets trending up 78.  - HbA1c 5.5,  LDL 42 - Keep SBP below 150s    9. AKI  - B/l Scr ~1.0>>spiked to 1.90  - suspect ATN from hemorrhagic shock/hypotension, now post resuscitation - Resolving.   10. Hypokalemia - Stable.   Consult PT/OT/Speech . Placing Cortrak   Length of Stay: 6  Amritha Yorke, NP  09/23/2022, 7:54 AM  Advanced Heart Failure Team Pager [phone] (M-F; 7a - 5p)  Please contact [facility] for night-coverage after hours (5p -7a ) and weekends on [email or web address]

## 2022-09-23 NOTE — Progress Notes (Signed)
NAME:  James Estrada, MRN:  [id number], DOB:  [birth year], LOS: 6 ADMISSION DATE:  09/17/2022, CONSULTATION DATE:  11/8 REFERRING MD:  Bensimhon, CHIEF COMPLAINT:  encephalpathy, respiratory failure   History of Present Illness:  James Estrada is a 81 y/o gentleman with a history of AAA, GERD, HLD, HTN who presented with acute onset of weakness and pain across his chest and upper back that caused him to collapse on 11/4.  In the ED a code stroke was called.  He was was found to have an acute ascending aortic dissection with thrombosis of the false lumen with extension into the brachiocephalic artery with narrowing and into the proximal carotid artery.  CT scan also noted left retroperitoneal hemorrhage from the iliics to the left kidney and decreased perfusion to the left kidney.  He underwent emergency dissection repair with vascular graft on 11/4.  He was found to have embolic strokes postoperatively.  He continues to require mechanical ventilation due to encephalopathy and agitation. At baseline he is very active, walking every day.  PCCM was consulted for assistance with ventilator weaning.  Pertinent  Medical History  AAA status post endovascular repair in 2020 Hypertension Hyperlipidemia GERD Cataract extraction bilaterally  Significant Hospital Events: Including procedures, antibiotic start and stop dates in addition to other pertinent events   11/4 Ascending aortic dissection, s/p graft repair on bypass 11/8 oral sedatives started, off precedex 11/7 small ICA strokes 11/9 extubated Interim History / Subjective:  Tolerated extubation without distress. Starting to say a few words and follows commands intermittently.  Objective   Blood pressure (!) 151/79, pulse 64, temperature 98.3 F (36.8 C), temperature source Axillary, resp. rate (!) 21, height 6' (1.829 m), weight 82.8 kg, SpO2 99 %. CVP:  [3 mmHg-13 mmHg] 3 mmHg  FiO2 (%):  [40 %] 40 %   Intake/Output Summary (Last 24  hours) at 09/23/2022 1343 Last data filed at 09/23/2022 1100 Gross per 24 hour  Intake 797.28 ml  Output 1935 ml  Net -1137.72 ml    Filed Weights   09/21/22 0500 09/22/22 0500 09/23/22 0500  Weight: 92.7 kg 88.2 kg 82.8 kg   Examination: General: critically ill appearing man lying in bed in NAD HENT: [place]/AT, eyes anicteric Lungs: breathing comfortably, chest clear bilaterally Cardiovascular: S1S2, RRR Abdomen: soft, NT Extremities: improving pedal edema Neuro: Awake, somnolent speech somewhat dysarthric.  Follows commands intermittently.  Some right leg weakness. GU: foley with amber urine Derm: warm, dry, no diffuse rashes  Ancillary tests personally reviewed:  Creatinine is improving to 1.44 Mild leukocytosis 14.0 Platelet count improving 78 X-ray shows bilateral small pleural effusions and atelectasis.  Assessment & Plan:  Acute ascending aortic dissection s/p graft repair Pericardial effusion -post-op care per TCTS -Changing postop pain control meds given AKI- fentanyl and oxycodone preferred to prevent accumulation - Postop pacing wires, chest tubes with cardiothoracic surgery. -Hold chemical prophylaxis due to thrombocytopenia  Postop paroxysmal atrial fibrillation Acute HFpEF Periodic hypertension with agitation - Amiodarone --con't diuresis -monitor on tele  Acute respiratory failure with hypoxia now extubated Mucus plugging R pleural effusion -con't diureiss - Encourage incentive spirometry  AKI, improving Left renal artery narrowing demonstrated on CT -Allow autoregulation.  Embolic strokes complicating aortic dissection repair -appreciate stroke team's management-  Will need stroke rehabilitation -Obtain MRI if possible once pacing wires removed. -PT, OT, SLP  Hyperglycemia now well controlled -Continue detemir 12 units twice daily plus sliding scale insulin - goal blood glucose less than 180  Thrombocytopenia,  consumptive vs due to critical  illness and recent aortic surgery. Acute blood loss anemia -monitor -transfuse for Hb <7 or hemodynamically significant bleeding  Daughter &  wife updated today.  Best Practice (right click and "Reselect all SmartList Selections" daily)   Diet/type: tubefeeds core track placed today DVT prophylaxis: SCD, start chemical prophylaxis once platelet count greater than 80 GI prophylaxis: H2B Lines: Central line, Arterial Line, and No longer needed.  Order written to d/c  Foley:  removal ordered  Code Status:  full code Last date of multidisciplinary goals of care discussion [full scope]  Lynnell Catalan, MD [facility] ICU Physician [facility]  Pager: [phone] Mobile: [phone] After hours: [phone].

## 2022-09-23 NOTE — Evaluation (Signed)
Occupational Therapy Evaluation Patient Details Name: James Estrada MRN: [id number] DOB: Dec 04, 1940 Today's Date: 09/23/2022   History of Present Illness Pt is a 81 y/o M admitted to [facility] on 09/17/22 with BLE weakness, chest + back pain, admitted with CT angio shows type dissection with thrombosed false lumen. Repair of type 1 aortic dissection performed 11/4. Head CT 11/6 acute infarcts within R MCA involvement, including R cerebellum, basal ganglia, frontal lobe. ETT 11/4-11/9. PMH AAA s/p endovascular repair (2020), HTN, HLD, GERD.   Clinical Impression   Pt was independent and very active prior to admission. He lives with his wife, but due to multiple stairs in home, family plans for him to go to his daughter's home upon discharge. Pt presents with generalized weakness, L hemiplegia, L inattention, decreased sitting balance and impaired cognition. Pt is dependent in all ADLs and requires 2 person assist for mobility. Pt much more interactive, smiling, repeating 'hi' and following simple commands inconsistently when seated EOB. Pt with stable VS throughout session on 4L 02. He will be an excellent intensive rehab candidate when medically stable.      Recommendations for follow up therapy are one component of a multi-disciplinary discharge planning process, led by the attending physician.  Recommendations may be updated based on patient status, additional functional criteria and insurance authorization.   Follow Up Recommendations  Acute inpatient rehab (3hours/day)    Assistance Recommended at Discharge Frequent or constant Supervision/Assistance  Patient can return home with the following Two people to help with walking and/or transfers;Two people to help with bathing/dressing/bathroom;Assistance with cooking/housework;Assistance with feeding;Direct supervision/assist for medications management;Direct supervision/assist for financial management;Assist for transportation;Help with stairs or  ramp for entrance    Functional Status Assessment  Patient has had a recent decline in their functional status and demonstrates the ability to make significant improvements in function in a reasonable and predictable amount of time.  Equipment Recommendations  Other (comment) (defer to next venue)    Recommendations for Other Services       Precautions / Restrictions Precautions Precautions: Fall;Other (comment);Sternal Restrictions Weight Bearing Restrictions: No      Mobility Bed Mobility Overal bed mobility: Needs Assistance Bed Mobility: Sidelying to Sit, Sit to Supine, Rolling Rolling: Max assist Sidelying to sit: Max assist, +2 for physical assistance   Sit to supine: Max assist, +2 for physical assistance   General bed mobility comments: minor attempts to roll to R but did not successfully weight shift entirely; maxA+2 supine<>sit with pt demonstrating initial attempt to activation RLE. good trunk control observed sitting EOB and long sitting, see sitting balance notes for more detail    Transfers Overall transfer level: Needs assistance Equipment used: 2 person hand held assist Transfers: Sit to/from Stand Sit to Stand: Max assist, +2 physical assistance           General transfer comment: three trials sit to stand max+2 clearing buttock but unable to achieve full upright position; pt self corrected one time with attempt at forward gaze to daughter. LLE dependent on maxA to prevent complete buckling.      Balance Overall balance assessment: Needs assistance   Sitting balance-Leahy Scale: Fair Sitting balance - Comments: various sitting positions attempted to assess righting reactions; pt modA at beginning of session to prevent leaning R/L or posterior, but ultimately progressing to min guard-minA and demonsrated recovery when leaning outside BOS in all directions. command following inconsistent and delayed when cues for upright posture Postural control:  Posterior lean, Right lateral  lean, Left lateral lean Standing balance support: Bilateral upper extremity supported, During functional activity Standing balance-Leahy Scale: Zero                             ADL either performed or assessed with clinical judgement   ADL                                         General ADL Comments: can bring R hand to face     Vision         Perception     Praxis      Pertinent Vitals/Pain Pain Assessment Pain Assessment: Faces Faces Pain Scale: No hurt     Hand Dominance Right   Extremity/Trunk Assessment Upper Extremity Assessment Upper Extremity Assessment: LUE deficits/detail LUE Deficits / Details: no voluntary or reflexive movement noted LUE Coordination: decreased fine motor;decreased gross motor   Lower Extremity Assessment Lower Extremity Assessment: Defer to PT evaluation   Cervical / Trunk Assessment Cervical / Trunk Assessment: Other exceptions (weakness)   Communication Communication Communication: Expressive difficulties   Cognition Arousal/Alertness: Awake/alert Behavior During Therapy: Flat affect Overall Cognitive Status: Impaired/Different from baseline                                 General Comments: keeping eyes closed initially, alert/awake once sitting EOB and mantaining eye contact on R-side and smiling in response to therapist's conversation. verbalized "ok", "hi", and clearing throat, otherwise no verbalizations. does not appear to track past midline with L intattention     General Comments       Exercises     Shoulder Instructions      Home Living Family/patient expects to be discharged to:: Private residence Living Arrangements: Spouse/significant other Available Help at Discharge: Family;Available 24 hours/day Type of Home: House Home Access: Stairs to enter [employer] of Steps: 3   Home Layout: Two level;Able to live on main level with  bedroom/bathroom     Bathroom Shower/Tub: Chief Strategy Officer: Standard     Home Equipment: None   Additional Comments: pt lives in multilevel home; daughter Marisue Ivan) states pt would likely d/c to her home so he could live on one level and she can provide 24/7 assist as needed (home set-up of daughter's home; she works in Research officer, political party and has flexible hours)      Prior Functioning/Environment Prior Level of Function : Independent/Modified Independent;Working/employed;Driving             Mobility Comments: 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 retired Cytogeneticist, served in [place] ADLs Comments: Independent without DME. Wife does majority of household tasks; has housekeeper        OT Problem List: Decreased strength;Decreased activity tolerance;Impaired balance (sitting and/or standing);Impaired vision/perception;Decreased coordination;Decreased cognition;Decreased safety awareness;Decreased knowledge of use of DME or AE;Decreased knowledge of precautions;Cardiopulmonary status limiting activity;Impaired sensation;Impaired tone;Impaired UE functional use      OT Treatment/Interventions: Self-care/ADL training;Therapeutic activities;Patient/family education;Balance training;DME and/or AE instruction;Neuromuscular education;Cognitive remediation/compensation;Visual/perceptual remediation/compensation    OT Goals(Current goals can be found in the care plan section) Acute Rehab OT Goals OT Goal Formulation: With family Time For Goal Achievement: 10/07/22 Potential  to Achieve Goals: Good ADL Goals Pt Will Perform Grooming: with min assist;sitting Additional ADL Goal #1: Pt will follow one step commands with 75% accuracy. Additional ADL Goal #2: Pt will perform bed mobility with mod assist in preparation for ADLs. Additional ADL Goal #3: Pt will turn  head 45 degrees toward L to locate visual target with multimodal cues 3/5 trials. Additional ADL Goal #4: Pt will demonstrate fair sitting balance as a precursor to ADLs.  OT Frequency: Min 3X/week    Co-evaluation PT/OT/SLP Co-Evaluation/Treatment: Yes Reason for Co-Treatment: Complexity of the patient's impairments (multi-system involvement)   OT goals addressed during session: ADL's and self-care;Strengthening/ROM      AM-PAC OT "6 Clicks" Daily Activity     Outcome Measure Help from another person eating meals?: Total Help from another person taking care of personal grooming?: Total Help from another person toileting, which includes using toliet, bedpan, or urinal?: Total Help from another person bathing (including washing, rinsing, drying)?: Total Help from another person to put on and taking off regular upper body clothing?: Total Help from another person to put on and taking off regular lower body clothing?: Total 6 Click Score: 6   End of Session Equipment Utilized During Treatment: Gait belt;Oxygen (4L) Nurse Communication: Mobility status  Activity Tolerance: Patient tolerated treatment well Patient left: in bed;with call bell/phone within reach;with family/visitor present;with nursing/sitter in room  OT Visit Diagnosis: Unsteadiness on feet (R26.81);Muscle weakness (generalized) (M62.81);Hemiplegia and hemiparesis;Other symptoms and signs involving cognitive function Hemiplegia - Right/Left: Left Hemiplegia - dominant/non-dominant: Non-Dominant Hemiplegia - caused by: Cerebral infarction                Time: 2197-5883 OT Time Calculation (min): 42 min Charges:  OT General Charges $OT Visit: 1 Visit OT Evaluation $OT Eval High Complexity: 1 High  Berna Spare, OTR/L Acute Rehabilitation Services Office: [phone]  Evern Bio 09/23/2022, 1:52 PM

## 2022-09-23 NOTE — Progress Notes (Signed)
6 Days Post-Op Procedure(s) (LRB): REPAIR OF ACUTE ASCENDING THORACIC AORTIC DISSECTION USING 28 MM HEMASHIELD PLATINUM VASCULAR GRAFT (N/A) Subjective: Extubated, briefly opens eyes and follows simple commands, no verbal response to questions   Objective: Vital signs in last 24 hours: Temp:  [98.2 F (36.8 C)-99.1 F (37.3 C)] 98.9 F (37.2 C) (11/10 0300) Pulse Rate:  [49-74] 62 (11/10 0615) Cardiac Rhythm: Normal sinus rhythm;Atrial paced (11/09 2000) Resp:  [15-31] 21 (11/10 0615) BP: (111-147)/(54-95) 135/72 (11/10 0615) SpO2:  [86 %-100 %] 99 % (11/10 0615) Arterial Line BP: (129-179)/(43-74) 137/59 (11/10 0615) FiO2 (%):  [40 %] 40 % (11/09 1552) Weight:  [82.8 kg] 82.8 kg (11/10 0500)  Hemodynamic parameters for last 24 hours: CVP:  [3 mmHg-13 mmHg] 6 mmHg  Intake/Output from previous day: 11/09 0701 - 11/10 0700 In: 1385.3 [I.V.:422.8; NG/GT:762.5; IV Piggyback:200] Out: 4020 [Urine:4020] Intake/Output this shift: No intake/output data recorded.  General appearance: alert, cooperative, and no distress Neurologic: intact Heart: regular rate and rhythm Lungs: diminished breath sounds bibasilar Abdomen: normal findings: soft, non-tender Wound: clean and dry  Lab Results: Recent Labs    09/22/22 0422 09/23/22 0418  WBC 9.4 14.0*  HGB 7.6* 8.3*  HCT 23.3* 26.0*  PLT 60* 78*   BMET:  Recent Labs    09/22/22 0927 09/23/22 0418  NA 147* 149*  K 4.2 4.0  CL 109 108  CO2 29 30  GLUCOSE 162* 121*  BUN 50* 43*  CREATININE 2.01* 1.44*  CALCIUM 8.1* 8.6*    PT/INR: No results for input(s): "LABPROT", "INR" in the last 72 hours. ABG    Component Value Date/Time   PHART 7.479 (H) 09/20/2022 0830   HCO3 25.2 09/20/2022 0830   TCO2 26 09/20/2022 0830   ACIDBASEDEF 1.0 09/18/2022 1629   O2SAT 94 09/20/2022 0830   CBG (last 3)  Recent Labs    09/22/22 2241 09/22/22 2324 09/23/22 0316  GLUCAP 107* 105* 98    Assessment/Plan: S/P Procedure(s)  (LRB): REPAIR OF ACUTE ASCENDING THORACIC AORTIC DISSECTION USING 28 MM HEMASHIELD PLATINUM VASCULAR GRAFT (N/A) - NEURO- R MCA infarcts with left hemiparesis  Extubated yesterday  No verbal response to questions, no change left hemiparesis CV- in SR in 60s on IV amiodarone  Lopressor for BP control RESP- extubated yesterday  Good oxygenation and breathing comfortably RENAL- creatinine improved  Hypernatremic ENDO- CBG well controlled  Will stop levemir until talking PO GI- TF stopped post extubation  Hopefully mental status will allow oral intake Thrombocytopenia- PLT up slightly Anemia secondary to ABL- monitor PT, OT  LOS: 6 days    Loreli Slot 09/23/2022

## 2022-09-23 NOTE — Evaluation (Signed)
Physical Therapy Evaluation Patient Details Name: James Estrada MRN: [id number] DOB: [birth year] Today's Date: 09/23/2022  History of Present Illness  Pt is a 81 y/o M admitted to [facility] on 09/17/22 with BLE weakness, chest + back pain, admitted with CT angio shows type dissection with thrombosed false lumen. Repair of type 1 aortic dissection performed 11/4. Head CT 11/6 acute infarcts within R MCA involvement, including R cerebellum, basal ganglia, frontal lobe. ETT 11/4-11/9. PMH AAA s/p endovascular repair (2020), HTN, HLD, GERD.   Clinical Impression  Pt presents with an overall decrease in functional mobility secondary to above. PTA, pt was independent with all mobility and self care, exercising multiple days of the week. Educ family on sternal precautions, to be addressed as pt mobility progresses. Today, pt able to perform AROM of RLE and RUE with cues and increased time. Visual tracking on R to midline only, no demonstration of crossing to L side. Bed mobility and sit to stand transfer max+2. Pt with inconsistent response during treatment, occasional 1 word answers and would follows commands 30-50% of the time, maintained attention throughout a majority of the session.  Pt would benefit from continued acute PT services to maximize functional mobility and independence prior to d/c to AIR level therapies when medically appropriate.        Recommendations for follow up therapy are one component of a multi-disciplinary discharge planning process, led by the attending physician.  Recommendations may be updated based on patient status, additional functional criteria and insurance authorization.  Follow Up Recommendations Acute inpatient rehab (3hours/day)      Assistance Recommended at Discharge Frequent or constant Supervision/Assistance  Patient can return home with the following  Two people to help with walking and/or transfers;Two people to help with bathing/dressing/bathroom;Assistance  with cooking/housework;Assistance with feeding;Assist for transportation;Help with stairs or ramp for entrance    Equipment Recommendations Other (comment) (TBD)  Recommendations for Other Services  Rehab consult;Other (comment) (when medically ready)    Functional Status Assessment Patient has had a recent decline in their functional status and demonstrates the ability to make significant improvements in function in a reasonable and predictable amount of time.     Precautions / Restrictions Precautions Precautions: Fall;Other (comment);Sternal Precaution Booklet Issued: No Precaution Comments: R IJ, art line Restrictions Weight Bearing Restrictions: No      Mobility  Bed Mobility Overal bed mobility: Needs Assistance Bed Mobility: Sidelying to Sit, Sit to Supine, Rolling Rolling: Max assist Sidelying to sit: Max assist, +2 for physical assistance   Sit to supine: Max assist, +2 for physical assistance   General bed mobility comments: minor attempts to roll to R but did not successfully weight shift entirely; maxA+2 supine<>sit with pt demonstrating initial attempt to activation RLE. good trunk control observed sitting EOB and long sitting, see sitting balance notes for more detail    Transfers Overall transfer level: Needs assistance   Transfers: Sit to/from Stand Sit to Stand: Max assist, +2 physical assistance           General transfer comment: three trials sit to stand max+2 clearing buttock but unable to achieve full upright position; pt self corrected one time with attempt at forward gaze to daughter. LLE dependent on maxA to prevent complete buckling.    Ambulation/Gait                  Stairs            Wheelchair Mobility    Modified Rankin (Stroke Patients  Only) Modified Rankin (Stroke Patients Only) Pre-Morbid Rankin Score: No symptoms Modified Rankin: Severe disability     Balance Overall balance assessment: Needs  assistance Sitting-balance support: Single extremity supported, Bilateral upper extremity supported, No upper extremity supported, Feet supported, Feet unsupported Sitting balance-Leahy Scale: Fair Sitting balance - Comments: various sitting positions attempted to assess righting reactions; pt modA at beginning of session to prevent leaning R/L or posterior, but ultimatley progresses to min guard-minA and demonsrated recovery when leaning outside BOS in all directions. command following inconsistent and delayed when cues for upright posture Postural control: Posterior lean, Right lateral lean, Left lateral lean Standing balance support: Bilateral upper extremity supported, During functional activity Standing balance-Leahy Scale: Zero Standing balance comment: pt unable to maintain standing position without maxA+2                             Pertinent Vitals/Pain Pain Assessment Faces Pain Scale: No hurt Pain Intervention(s): Monitored during session    Home Living Family/patient expects to be discharged to:: Private residence Living Arrangements: Spouse/significant other Available Help at Discharge: Family;Available 24 hours/day Type of Home: House Home Access: Stairs to enter   [employer] of Steps: 3   Home Layout: Two level;Able to live on main level with bedroom/bathroom Home Equipment: None Additional Comments: pt lives in multilevel home; daughter Kathlee Nations) states pt would likely d/c to her home so he could live on one level and she can provide 24/7 assist as needed (home set-up of daughter's home; she works in Personal assistant and has flexible hours)    Prior Function Prior Level of Function : Independent/Modified Independent;Working/employed;Driving             Mobility Comments: 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 retired English as a second language teacher,  served in [place] ADLs Comments: Independent without DME. Wife does majority of household tasks; has housekeeper     Journalist, newspaper        Extremity/Trunk Assessment   Upper Extremity Assessment Upper Extremity Assessment: Generalized weakness;LUE deficits/detail LUE Deficits / Details: no intitiation demonstrated on L UE and trace contraction not exhibited with PROM    Lower Extremity Assessment Lower Extremity Assessment: Generalized weakness;LLE deficits/detail;RLE deficits/detail RLE Deficits / Details: inconsistently performed knee ext AROM when cued with increased time LLE Deficits / Details: minimal AROM present in bed upon entry, did not observe any volitional movement during session       Communication   Communication: Expressive difficulties  Cognition Arousal/Alertness: Awake/alert Behavior During Therapy: Flat affect Overall Cognitive Status: Impaired/Different from baseline                                 General Comments: keeping eyes closed initially, alert/awake once sitting EOB and mantaining eye contact on R-side and smiling in response to therapist's conversation. verbalized "ok", "hi", and clearing throat, otherwise no verbalizations. does not appear to track past midline with L intattention        General Comments General comments (skin integrity, edema, etc.): VSS during mobility on 4L [place] with sustained HR low 60s-high 70s    Exercises     Assessment/Plan    PT Assessment Patient needs continued PT services  PT Problem List Decreased strength;Decreased range of motion;Decreased activity tolerance;Decreased balance;Decreased mobility;Decreased coordination;Cardiopulmonary status limiting activity;Decreased knowledge  of precautions;Decreased safety awareness;Decreased knowledge of use of DME;Decreased cognition       PT Treatment Interventions DME instruction;Balance training;Gait training;Stair training;Cognitive remediation;Functional  mobility training;Patient/family education;Therapeutic activities;Therapeutic exercise;Neuromuscular re-education    PT Goals (Current goals can be found in the Care Plan section)  Acute Rehab PT Goals Patient Stated Goal: none stated due to expressive difficulties PT Goal Formulation: With patient Time For Goal Achievement: 10/07/22 Potential to Achieve Goals: Fair    Frequency Min 4X/week     Co-evaluation PT/OT/SLP Co-Evaluation/Treatment: Yes Reason for Co-Treatment: Complexity of the patient's impairments (multi-system involvement);Necessary to address cognition/behavior during functional activity;For patient/therapist safety;To address functional/ADL transfers PT goals addressed during session: Balance;Mobility/safety with mobility;Strengthening/ROM         AM-PAC PT "6 Clicks" Mobility  Outcome Measure Help needed turning from your back to your side while in a flat bed without using bedrails?: A Lot Help needed moving from lying on your back to sitting on the side of a flat bed without using bedrails?: Total Help needed moving to and from a bed to a chair (including a wheelchair)?: Total Help needed standing up from a chair using your arms (e.g., wheelchair or bedside chair)?: Total Help needed to walk in hospital room?: Total Help needed climbing 3-5 steps with a railing? : Total 6 Click Score: 7    End of Session Equipment Utilized During Treatment: Gait belt;Oxygen Activity Tolerance: Patient limited by fatigue;Patient tolerated treatment well Patient left: in bed;with call bell/phone within reach;with nursing/sitter in room;with family/visitor present;with restraints reapplied Nurse Communication: Mobility status;Other (comment) (Chair position in bed) PT Visit Diagnosis: Other abnormalities of gait and mobility (R26.89);Muscle weakness (generalized) (M62.81);Other symptoms and signs involving the nervous system (R29.898)    Time: 2130-8657 PT Time Calculation (min)  (ACUTE ONLY): 43 min   Charges:   PT Evaluation $PT Eval High Complexity: 1 High PT Treatments $Neuromuscular Re-education: 8-22 mins        Alric Ran, SPT   [place] Maureen Delatte 09/23/2022, 10:06 AM

## 2022-09-23 NOTE — Progress Notes (Signed)
? ?  Inpatient Rehab Admissions Coordinator : ? ?Per therapy recommendations, patient was screened for CIR candidacy by Linden Tagliaferro RN MSN.  At this time patient appears to be a potential candidate for CIR. I will place a rehab consult per protocol for full assessment. Please call me with any questions. ? ?Janaysia Mcleroy RN MSN ?Admissions Coordinator ?[phone] ?  ?

## 2022-09-23 NOTE — Progress Notes (Signed)
Nutrition Follow-up  DOCUMENTATION CODES:   Not applicable  INTERVENTION:   Tube Feeding via Cortrak:  Vital/Peptamen 1.5 at 55 ml/hr Pro-source TF20 60 mL daily This provides 118 g of protein, 2060 kcals, 1003 mL of free water  Add Free Water 200 mL q 6 hours; total free water with TF and FWF is 1800 mL. May require further increase to correct free water deficit.   NUTRITION DIAGNOSIS:   Inadequate oral intake related to acute illness as evidenced by NPO status.  Being addressed via TF   GOAL:   Patient will meet greater than or equal to 90% of their needs  Being addressed  MONITOR:   Labs, Weight trends, Vent status  REASON FOR ASSESSMENT:   Ventilator    ASSESSMENT:   81 yo male admitted with type 1 aortic dissection requiring emergent repair. PMH includes HTN, HLD, bradycardia, AAA s/p stent graft  11/04 Type 1 aortic dissection to OR for repair,  massive EBL requiring multiple transfusions 11/06 CT head with 3 small right strokes, 1 with 31mm area of bleeding, neurology consulted 11/09 Extubated  11/10 Cortrak placed  Pt not following commands. Not communicating at present.  NPO, Cortrak placed today. Noted possible plan for CIR  Hypernatremia with sodium trending up. No free water flush of MIVF currently. Plan to add free water today  Noted pt receiving tylenol solution per tube every 6 hours; if pt with diarrhea, this can be a contributor given high osmolality.   Labs: sodium 149 (H), Creatinine 1.44, BUN 43 Meds: ss novolog, colace, dulcolax   Diet Order:   Diet Order     None       EDUCATION NEEDS:   Not appropriate for education at this time  Skin:  Skin Assessment: Skin Integrity Issues: Skin Integrity Issues:: Incisions, Other (Comment) Incisions: sternum, groin Other: laceration to lip  Last BM:  11/9  Height:   Ht Readings from Last 1 Encounters:  09/18/22 6' (1.829 m)    Weight:   Wt Readings from Last 1 Encounters:   09/23/22 82.8 kg    BMI:  Body mass index is 24.76 kg/m.  Estimated Nutritional Needs:   Kcal:  2000-2200 kcals  Protein:  100-115 g  Fluid:  1.8 L    Romelle Starcher MS, RDN, LDN, CNSC Registered Dietitian 3 Clinical Nutrition RD Pager and On-Call Pager Number Located in [place]

## 2022-09-24 ENCOUNTER — Inpatient Hospital Stay (HOSPITAL_COMMUNITY): Payer: Medicare Other

## 2022-09-24 DIAGNOSIS — I63411 Cerebral infarction due to embolism of right middle cerebral artery: Secondary | ICD-10-CM

## 2022-09-24 DIAGNOSIS — Z9889 Other specified postprocedural states: Secondary | ICD-10-CM | POA: Diagnosis not present

## 2022-09-24 LAB — GLUCOSE, CAPILLARY
Glucose-Capillary: 167 mg/dL — ABNORMAL HIGH (ref 70–99)
Glucose-Capillary: 169 mg/dL — ABNORMAL HIGH (ref 70–99)
Glucose-Capillary: 167 mg/dL — ABNORMAL HIGH (ref 70–99)
Glucose-Capillary: 172 mg/dL — ABNORMAL HIGH (ref 70–99)
Glucose-Capillary: 149 mg/dL — ABNORMAL HIGH (ref 70–99)

## 2022-09-24 LAB — BASIC METABOLIC PANEL
Anion gap: 6 (ref 5–15)
BUN: 40 mg/dL — ABNORMAL HIGH (ref 8–23)
CO2: 30 mmol/L (ref 22–32)
Calcium: 8.2 mg/dL — ABNORMAL LOW (ref 8.9–10.3)
Chloride: 113 mmol/L — ABNORMAL HIGH (ref 98–111)
Creatinine, Ser: 1.3 mg/dL — ABNORMAL HIGH (ref 0.61–1.24)
GFR, Estimated: 56 mL/min — ABNORMAL LOW (ref >60)
Glucose, Bld: 161 mg/dL — ABNORMAL HIGH (ref 70–99)
Potassium: 3.8 mmol/L (ref 3.5–5.1)
Sodium: 149 mmol/L — ABNORMAL HIGH (ref 135–145)

## 2022-09-24 LAB — CBC
HCT: 26.6 % — ABNORMAL LOW (ref 39.0–52.0)
Hemoglobin: 8 g/dL — ABNORMAL LOW (ref 13.0–17.0)
MCH: 28.7 pg (ref 26.0–34.0)
MCHC: 30.1 g/dL (ref 30.0–36.0)
MCV: 95.3 fL (ref 80.0–100.0)
Platelets: 118 10*3/uL — ABNORMAL LOW (ref 150–400)
RBC: 2.79 MIL/uL — ABNORMAL LOW (ref 4.22–5.81)
RDW: 15.3 % (ref 11.5–15.5)
WBC: 14.4 10*3/uL — ABNORMAL HIGH (ref 4.0–10.5)
nRBC: 0.1 % (ref 0.0–0.2)

## 2022-09-24 MED ORDER — LEVALBUTEROL HCL 0.63 MG/3ML IN NEBU
0.6300 mg | INHALATION_SOLUTION | Freq: Two times a day (BID) | RESPIRATORY_TRACT | Status: DC
  Administered 2022-09-24 – 2022-09-25 (×2): 0.63 mg via RESPIRATORY_TRACT
  Filled 2022-09-24 (×2): qty 3

## 2022-09-24 MED ORDER — SODIUM CHLORIDE 0.9% FLUSH
10.0000 mL | Freq: Two times a day (BID) | INTRAVENOUS | Status: DC
  Administered 2022-09-24 – 2022-09-30 (×12): 10 mL

## 2022-09-24 MED ORDER — FREE WATER
200.0000 mL | Status: DC
  Administered 2022-09-24 – 2022-09-29 (×30): 200 mL

## 2022-09-24 MED ORDER — SODIUM CHLORIDE 0.9% FLUSH: 10.0000 mL | INTRAVENOUS | Status: DC | PRN

## 2022-09-24 NOTE — Progress Notes (Signed)
[facility] IP rehab admissions - I stopped by to see patient, but staff were busy with him.  I called and talked to his wife.  I explained inpatient rehab and will bring brochures to wife tomorrow.  Wife would like CIR.  Patient has [employer] and we will need their authorization.  I will have my partners follow up on Monday for plans.  Call me for questions.  [phone]

## 2022-09-24 NOTE — Progress Notes (Addendum)
STROKE TEAM PROGRESS NOTE   ATTENDING NOTE: I reviewed above note and agree with the assessment and plan. Pt was seen and examined.   81 year old male with history of hypertension, hyperlipidemia, AAA status post stent graft admitted for bilateral lower extremity weakness left more than right, left flank and back pain causing collapse.  CT no acute abnormality.  CTA aorta are showed type I aortic dissection with retroperitoneal bleeding.  Status post hemiarch repair.  CT head and neck no LVO, but aortic dissection extending to brachiocephalic artery, right subclavian artery and right CCA origin.  CT repeat showed right MCA small infarct with hemorrhagic transformation at right frontal. Found to have a postop A-fib, put him on amiodarone, reversed to normal rhythm.  However, per RN, patient still in and out of A-fib on telemetry.  EF 60 to 65%, LDL 42, A1c 5.5.  Creatinine 1.44-1.30.  WBC 14.4, hemoglobin 8.0, platelet 118.  Tmax 100.9  On exam, daughter and son-in-law are at the bedside. Pt is sleeping and hard to arouse. He is not open eyes on voice and not answer questions or following commands. With forced eye opening, pt eyes midline, not tracking bilaterally, not consistently blinking to visual threat bilaterally. No significant facial droop. Tongue protrusion not cooperative. Spontaneously moving b/l UE purposeful and against gravity, withdraw to pain at RLE but not at LLE. Sensation, coordination and gait not tested.   Etiology for patient embolic stroke could be due to aortic dissection extending to right CCA, postcardiac/aortic procedure, or intermittent A-fib not on AC.  Currently MRI pending.  We will try to capture A-fib on EKG and decide on anticoagulation if needed.  Continue aspirin 81 for now.  No statin needed given LDL at goal. Will follow.  For detailed assessment and plan, please refer to above/below as I have made changes wherever appropriate.   I had long discussion with daughter  and son-in-law at bedside, updated pt current condition, treatment plan and potential prognosis, and answered all the questions.  They expressed understanding and appreciation.    Marvel Plan, MD PhD Stroke Neurology 09/24/2022 3:07 PM  This patient is critically ill due to stroke, aortic dissection, status post aortic arch repair, PAF, fever and at significant risk of neurological worsening, death form recurrent stroke, hemorrhagic transformation, heart failure, sepsis. This patient's care requires constant monitoring of vital signs, hemodynamics, respiratory and cardiac monitoring, review of multiple databases, neurological assessment, discussion with family, other specialists and medical decision making of high complexity. I spent 40 minutes of neurocritical care time in the care of this patient.     INTERVAL HISTORY His wife and son are at the bedside. Patient remains extubated. Per patient's wife, patient has spoken a few words this morning. He still has significant left hemiplegia.  Atrial fibrillation on cardiac monitor during assessment this morning. Per bedside RN, patient goes in and out of atrial fibrillation frequently. Requested EKG with suspected atrial fibrillation.   Vitals:   09/24/22 0700 09/24/22 0759 09/24/22 0800 09/24/22 0902  BP: 114/72 133/68 133/68   Pulse: 62 70 68   Resp: (!) 25 (!) 24 (!) 21   Temp:  (!) 100.9 F (38.3 C)    TempSrc:  Oral    SpO2: 97% 97% 98% 97%  Weight:      Height:       CBC:  Recent Labs  Lab 09/17/22 1722 09/17/22 1743 09/23/22 0418 09/24/22 0419  WBC 13.3*   < > 14.0* 14.4*  NEUTROABS 9.5*  --   --   --  HGB 13.8   < > 8.3* 8.0*  HCT 43.0   < > 26.0* 26.6*  MCV 87.6   < > 90.6 95.3  PLT 115*   < > 78* 118*   < > = values in this interval not displayed.   Basic Metabolic Panel:  Recent Labs  Lab 09/21/22 0522 09/21/22 1758 09/22/22 0422 09/23/22 0418 09/24/22 0419  NA 143  --    < > 149* 149*  K 3.1*  --    < >  4.0 3.8  CL 106  --    < > 108 113*  CO2 28  --    < > 30 30  GLUCOSE 147*  --    < > 121* 161*  BUN 43*  --    < > 43* 40*  CREATININE 1.99*  --    < > 1.44* 1.30*  CALCIUM 7.5*  --    < > 8.6* 8.2*  MG 2.5* 2.6*  --   --   --   PHOS 3.3 3.6  --   --   --    < > = values in this interval not displayed.   Lipid Panel:  Recent Labs  Lab 09/19/22 0528  CHOL 82  TRIG 92  HDL 22*  CHOLHDL 3.7  VLDL 18  LDLCALC 42   HgbA1c:  Recent Labs  Lab 09/19/22 1507  HGBA1C 5.5   Urine Drug Screen: No results for input(s): "LABOPIA", "COCAINSCRNUR", "LABBENZ", "AMPHETMU", "THCU", "LABBARB" in the last 168 hours.  Alcohol Level  Recent Labs  Lab 09/17/22 1733  ETH <10   IMAGING past 24 hours DG Abd Portable 1V  Result Date: 09/23/2022 CLINICAL DATA:  Feeding tube placement EXAM: PORTABLE ABDOMEN - 1 VIEW COMPARISON:  None Available. FINDINGS: Non weighted enteric feeding tube is positioned with tip below the diaphragm, overlying the lumbar spine, in the vicinity of the gastric antrum or pylorus. Nonobstructive pattern of included bowel gas. Cardiomegaly. IMPRESSION: Non weighted enteric feeding tube is positioned with tip below the diaphragm, overlying the lumbar spine, in the vicinity of the gastric antrum or pylorus. Consider advancement if post pyloric positioning is desired. Electronically Signed   By: Delanna Ahmadi M.D.   On: 09/23/2022 12:11   [place] EKG SITE RITE  Result Date: 09/23/2022 If Site Rite image not attached, placement could not be confirmed due to current cardiac rhythm.   PHYSICAL EXAM General: Ill appearing male sitting up in ICU bed HEENT: Head is atraumatic, central line in place right neck, secured with tegaderm.  Cardiovascular: No pedal edema, irregular rate and rhythm on cardiac monitor.   Mental status: Patient is awake but is not interactive with examiner. Once during examination, he mumbles with hypophonic, incomprehensible speech. He does not follow  commands. Does not answer orientation questions.  Cranial Nerves: PERRL 67mm/brisk. Patient does not track examiner throughout, oculocephalic reflex is intact, does not blink to threat on the left, inconsistent blink to threat on the right. Cough is intact.  Motor/sensory: No movement of LUE, minimal response to noxious stimuli to the LLE, spontaneous and antigravity movement of the RLE and RUE Gait: Deferred  ASSESSMENT/PLAN [name] is a 81 y.o. male with history of AAA s/p repair, presenting with acute onset of severe left worse than RLE weakness in conjunction with left flank/back pain resulting in collapse at home. STAT CT head on presentation was with no acute abnormality. He is now status post hemiarch repair of acute ascending  thoracic aortic dissection (type 1 aortic dissection) with Hemashield graft using moderate hypothermic circulatory arrest .  He also developed transient postop atrial fibrillation but reverted to sinus rhythm after starting amiodarone drip.  Patient was extubated 11/9 and current stroke is secondary to current cardiac surgery.   Stroke:  scattered areas of acute infarction in the right MCA territory likely occurred perioperatively s/p aortic arch repair and transient Afib Type 1 Aortic Dissection CT head 11/6:  - Interval development of patchy small acute cortical/subcortical infarcts within the right MCA vascular territory. 4 mm focus of hemorrhage associated with an infarct within the anterior right frontal lobe. Adjacent small curvilinear focus of hyperdensity, also along the anterior right frontal lobe, which may reflect additional small-volume acute hemorrhage or hyperdense thrombus within a distal right MCA vessel. - New acute infarct within the right corona radiata/basal ganglia, measuring 1.9 x 2.2 cm. - Tiny infarct within the right cerebellar hemisphere, not definitively present on the prior head CT of 09/17/2022 and possibly acute. - Background mild  chronic small vessel changes within the cerebral white matter matter. - Mild generalized cerebral atrophy.  CT head repeat 1/6: Redemonstrated infarcts in the right MCA territory, including foci in the right precentral gyrus and left caudate/corona radiata, as well as a hemorrhagic focus in the anterior right frontal lobe. No evidence of increasing intraparenchymal hemorrhage. CT Head repeat 11/9: Scattered areas of acute infarct in the right MCA territory appear more prominent and slightly larger compared with 3 days ago. This is likely evolutionary change with edema in the infarct 4 mm focus of hemorrhage in the right anterolateral frontal lobe unchanged. No new hemorrhage. CTA head & neck  1. No large vessel occlusion. 2. Partially visualized known aortic dissection extending into the brachiocephalic artery, proximal right subclavian artery, and origin of the right common carotid artery. 3. Mild intracranial and cervical carotid atherosclerosis without significant proximal stenosis. 4. Small bilateral ICA aneurysms. 5. Bilateral pleural effusions. MRI  pending  TEE intraoperative: LVEF 60-65%, no LVH. No left atrial appendage thrombus. No atrial level shunt. There is evidence of dissection in the ascending aorta and aortic arch.  EEG 11/6: This study is suggestive of moderate to severe diffuse encephalopathy, nonspecific etiology. No seizures or epileptiform discharges were seen throughout the recording.  LDL 42 HgbA1c 5.5 VTE prophylaxis - SCDs    There are no active orders of the following types: Diet, Nourishments.   No antithrombotic prior to admission, now on aspirin 81 mg daily. With evidence of Atrial fibrillation recurrence, will consider full dose AC.  Therapy recommendations:  CIR Disposition:  Pending   Hypertension Home meds:  None  Stable Permissive hypertension (OK if < 220/120) but gradually normalize in 5-7 days Long-term BP goal normotensive  Hyperlipidemia Home  meds:  none reported LDL 42, goal < 70 High intensity statin not indicated as patient is at goal  Diabetes type II Controlled Home meds:  None HgbA1c 5.5, goal < 7.0 CBGs Recent Labs    09/23/22 2259 09/24/22 0310 09/24/22 0804  GLUCAP 162* 167* 169*    SSI  Other Stroke Risk Factors Advanced Age >/= 61   Other Active Problems Type 1 aortic dissection s/p repair   Hospital day # 7  Stevi Toberman, AGACNP-BC [facility] Pager: [phone]  To contact Stroke Continuity provider, please refer to [email or web address] After hours, contact General Neurology

## 2022-09-24 NOTE — Progress Notes (Signed)
7 Days Post-Op Procedure(s) (LRB): REPAIR OF ACUTE ASCENDING THORACIC AORTIC DISSECTION USING 28 MM HEMASHIELD PLATINUM VASCULAR GRAFT (N/A) Subjective: He has been hemodynamically stable in sinus rhythm 60's on amio per tube.  Per nurse, not moving left side. Not following commands.  Objective: Vital signs in last 24 hours: Temp:  [97.7 F (36.5 C)-100.9 F (38.3 C)] 99.1 F (37.3 C) (11/11 1117) Pulse Rate:  [59-123] 64 (11/11 1200) Cardiac Rhythm: Normal sinus rhythm (11/11 0800) Resp:  [17-37] 22 (11/11 1200) BP: (114-154)/(54-79) 117/74 (11/11 1200) SpO2:  [60 %-99 %] 95 % (11/11 1200) Weight:  [84.3 kg] 84.3 kg (11/11 0500)  Hemodynamic parameters for last 24 hours:    Intake/Output from previous day: 11/10 0701 - 11/11 0700 In: 1107 [I.V.:384.5; NG/GT:672.5; IV Piggyback:50] Out: 915 [Urine:915] Intake/Output this shift: Total I/O In: 577.4 [I.V.:47.4; NG/GT:530] Out: 900 [Urine:900]  General appearance: awake, looking around room Neurologic: not following commands or talking but smiled at me. Wife  says he said a few words to them yesterday. Heart: regular rate and rhythm, S1, S2 normal, no murmur Lungs: clear to auscultation bilaterally Abdomen: soft, non-tender; bowel sounds normal Extremities: no edema  Lab Results: Recent Labs    09/23/22 0418 09/24/22 0419  WBC 14.0* 14.4*  HGB 8.3* 8.0*  HCT 26.0* 26.6*  PLT 78* 118*   BMET:  Recent Labs    09/23/22 0418 09/24/22 0419  NA 149* 149*  K 4.0 3.8  CL 108 113*  CO2 30 30  GLUCOSE 121* 161*  BUN 43* 40*  CREATININE 1.44* 1.30*  CALCIUM 8.6* 8.2*    PT/INR: No results for input(s): "LABPROT", "INR" in the last 72 hours. ABG    Component Value Date/Time   PHART 7.479 (H) 09/20/2022 0830   HCO3 25.2 09/20/2022 0830   TCO2 26 09/20/2022 0830   ACIDBASEDEF 1.0 09/18/2022 1629   O2SAT 94 09/20/2022 0830   CBG (last 3)  Recent Labs    09/24/22 0310 09/24/22 0804 09/24/22 1114  GLUCAP  167* 169* 167*    Assessment/Plan: S/P Procedure(s) (LRB): REPAIR OF ACUTE ASCENDING THORACIC AORTIC DISSECTION USING 28 MM HEMASHIELD PLATINUM VASCULAR GRAFT (N/A)  POD 7 Hemodynamically stable in sinus rhythm. Continue amio and lopressor.  Right cerebral infarct with left hemiplegia: neuro following.  Continue tube feeds.  DC sleeve and pacing wires since PICC is in.  Hopefully to CIR.  LOS: 7 days    Alleen Borne 09/24/2022

## 2022-09-24 NOTE — Progress Notes (Signed)
Peripherally Inserted Central Catheter Placement  The IV Nurse has discussed with the patient and/or persons authorized to consent for the patient, the purpose of this procedure and the potential benefits and risks involved with this procedure.  The benefits include less needle sticks, lab draws from the catheter, and the patient may be discharged home with the catheter. Risks include, but not limited to, infection, bleeding, blood clot (thrombus formation), and puncture of an artery; nerve damage and irregular heartbeat and possibility to perform a PICC exchange if needed/ordered by physician.  Alternatives to this procedure were also discussed.  Bard Power PICC patient education guide, fact sheet on infection prevention and patient information card has been provided to patient /or left at bedside.  Wife at bedside signed consent due to pt disoriented.  PICC Placement Documentation  PICC Double Lumen 09/24/22 Right Basilic 40 cm 0 cm (Active)  Indication for Insertion or Continuance of Line Prolonged intravenous therapies 09/24/22 1039  Exposed Catheter (cm) 0 cm 09/24/22 1039  Site Assessment Clean, Dry, Intact 09/24/22 1039  Lumen #1 Status Flushed;Saline locked;Blood return noted 09/24/22 1039  Lumen #2 Status Flushed;Saline locked;Blood return noted 09/24/22 1039  Dressing Type Transparent;Securing device 09/24/22 1039  Dressing Status Antimicrobial disc in place;Clean, Dry, Intact 09/24/22 1039  Safety Lock Not Applicable 09/24/22 1039  Line Care Connections checked and tightened 09/24/22 1039  Line Adjustment (NICU/IV Team Only) No 09/24/22 1039  Dressing Intervention New dressing 09/24/22 1039  Dressing Change Due 10/01/22 09/24/22 1039       Elliot Dally 09/24/2022, 10:41 AM

## 2022-09-25 DIAGNOSIS — I4891 Unspecified atrial fibrillation: Secondary | ICD-10-CM

## 2022-09-25 DIAGNOSIS — Z9889 Other specified postprocedural states: Secondary | ICD-10-CM | POA: Diagnosis not present

## 2022-09-25 DIAGNOSIS — I6340 Cerebral infarction due to embolism of unspecified cerebral artery: Secondary | ICD-10-CM

## 2022-09-25 DIAGNOSIS — I7103 Dissection of thoracoabdominal aorta: Secondary | ICD-10-CM

## 2022-09-25 DIAGNOSIS — R58 Hemorrhage, not elsewhere classified: Secondary | ICD-10-CM

## 2022-09-25 LAB — GLUCOSE, CAPILLARY
Glucose-Capillary: 130 mg/dL — ABNORMAL HIGH (ref 70–99)
Glucose-Capillary: 145 mg/dL — ABNORMAL HIGH (ref 70–99)
Glucose-Capillary: 154 mg/dL — ABNORMAL HIGH (ref 70–99)
Glucose-Capillary: 158 mg/dL — ABNORMAL HIGH (ref 70–99)
Glucose-Capillary: 160 mg/dL — ABNORMAL HIGH (ref 70–99)
Glucose-Capillary: 166 mg/dL — ABNORMAL HIGH (ref 70–99)
Glucose-Capillary: 171 mg/dL — ABNORMAL HIGH (ref 70–99)

## 2022-09-25 MED ORDER — PEPTAMEN 1.5 CAL PO LIQD
1000.0000 mL | ORAL | Status: DC
  Administered 2022-09-25 – 2022-09-27 (×2): 1000 mL
  Filled 2022-09-25 (×7): qty 1000

## 2022-09-25 MED ORDER — LEVALBUTEROL HCL 0.63 MG/3ML IN NEBU: 0.6300 mg | INHALATION_SOLUTION | Freq: Four times a day (QID) | RESPIRATORY_TRACT | Status: DC | PRN

## 2022-09-25 NOTE — Progress Notes (Signed)
8 Days Post-Op Procedure(s) (LRB): REPAIR OF ACUTE ASCENDING THORACIC AORTIC DISSECTION USING 28 MM HEMASHIELD PLATINUM VASCULAR GRAFT (N/A) Subjective:  Had a stable night. Wife is in with him now. Reportedly saying more words. He had MRI overnight showing numerous bilateral foci of acute ischemia R>L in MCA and PCA territories as well as cerebellum.  Objective: Vital signs in last 24 hours: Temp:  [98.2 F (36.8 C)-98.6 F (37 C)] 98.6 F (37 C) (11/12 0735) Pulse Rate:  [62-70] 69 (11/12 1100) Cardiac Rhythm: Normal sinus rhythm (11/12 0730) Resp:  [13-31] 19 (11/12 1100) BP: (112-160)/(51-118) 160/72 (11/12 1100) SpO2:  [92 %-100 %] 94 % (11/12 1100) Weight:  [81.4 kg] 81.4 kg (11/12 0400)  Hemodynamic parameters for last 24 hours:    Intake/Output from previous day: 11/11 0701 - 11/12 0700 In: 1960.9 [I.V.:47.4; NG/GT:1913.6] Out: 2150 [Urine:2150] Intake/Output this shift: Total I/O In: 636.5 [NG/GT:636.5] Out: 400 [Urine:400]  General appearance: calm, repeats my short question. Squeezed right hand to command Neurologic: left sided weakness Heart: regular rate and rhythm Lungs: clear to auscultation bilaterally Abdomen: soft, non-tender; bowel sounds normal Extremities: extremities normal, no edema. Wound: incisions healing well.  Lab Results: Recent Labs    09/23/22 0418 09/24/22 0419  WBC 14.0* 14.4*  HGB 8.3* 8.0*  HCT 26.0* 26.6*  PLT 78* 118*   BMET:  Recent Labs    09/23/22 0418 09/24/22 0419  NA 149* 149*  K 4.0 3.8  CL 108 113*  CO2 30 30  GLUCOSE 121* 161*  BUN 43* 40*  CREATININE 1.44* 1.30*  CALCIUM 8.6* 8.2*    PT/INR: No results for input(s): "LABPROT", "INR" in the last 72 hours. ABG    Component Value Date/Time   PHART 7.479 (H) 09/20/2022 0830   HCO3 25.2 09/20/2022 0830   TCO2 26 09/20/2022 0830   ACIDBASEDEF 1.0 09/18/2022 1629   O2SAT 94 09/20/2022 0830   CBG (last 3)  Recent Labs    09/25/22 0017 09/25/22 0532  09/25/22 0738  GLUCAP 130* 160* 145*    Assessment/Plan: S/P Procedure(s) (LRB): REPAIR OF ACUTE ASCENDING THORACIC AORTIC DISSECTION USING 28 MM HEMASHIELD PLATINUM VASCULAR GRAFT (N/A)  POD 8.  Hemodynamically stable in sinus rhythm on amio and Lopressor.  Bilateral cerebral infarcts with left hemiplegia.   Continue tube feeds for nutrition.  Try to mobilize.  CIR eval at some point.  Discussed status and plans with wife at bedside.   LOS: 8 days    Alleen Borne 09/25/2022

## 2022-09-25 NOTE — Progress Notes (Signed)
Patient ID: James Estrada, male   DOB: [birth year], 81 y.o.   MRN: [id number] T[facility] Evening Rounds:  Hemodynamically stable in sinus rhythm.   Sats 97% on 2L.  UO good.   Stable day.

## 2022-09-25 NOTE — Progress Notes (Addendum)
STROKE TEAM PROGRESS NOTE   ATTENDING NOTE: I reviewed above note and agree with the assessment and plan. Pt was seen and examined.   Daughter and RN are at the bedside. Pt lying in bed, eyes spontaneously open, right gaze preference, not cross midline. Blinking to visual threat bilaterally, mild right facial droop. Able to repeat simple sentences but no spontaneous speech, able to tell me his first name but not last name or his daughters name, told me he is 81 years old, no significant dysarthria but anomia and not quite following commands. Left UE and LE still weak, not against gravity. RUE no drift and RLE strong withdraw to pain.  MRI done overnight showed embolic shower, more at right hemisphere, but also seen at left MCA/ACA and bilateral cerebellum pulmonary infarcts.  Stroke etiology could be aortic dissection extending to right CCA and right subclavian artery versus cardiac/aortic procedure related versus postop A-fib.  Currently on aspirin 81.  So far no recurrent A-fib found, but need close monitoring, if recurrent, may consider anticoagulation.  No statin needed given LDL at goal.  PT/OT/speech recommend CIR.  For detailed assessment and plan, please refer to above/below as I have made changes wherever appropriate.   Neurology will sign off. Please call with questions. Pt will follow up with stroke clinic NP at [facility] in about 4 weeks. Thanks for the consult.   Rosalin Hawking, MD PhD Stroke Neurology 09/25/2022 3:05 PM    INTERVAL HISTORY His family is at the bedside. He is aphasic, not following commands. Per wife he has said a few words this morning He still has significant hemiplegia on the left side, withdraws to pain in all 4 extremities.  No new neurological events overnight.   Vitals:   09/25/22 0700 09/25/22 0735 09/25/22 0800 09/25/22 0852  BP: 137/62  122/85   Pulse: 63  62   Resp: (!) 22  (!) 23   Temp:  98.6 F (37 C)    TempSrc:  Oral    SpO2: 97%  98% 97%   Weight:      Height:       CBC:  Recent Labs  Lab 09/23/22 0418 09/24/22 0419  WBC 14.0* 14.4*  HGB 8.3* 8.0*  HCT 26.0* 26.6*  MCV 90.6 95.3  PLT 78* 118*    Basic Metabolic Panel:  Recent Labs  Lab 09/21/22 0522 09/21/22 1758 09/22/22 0422 09/23/22 0418 09/24/22 0419  NA 143  --    < > 149* 149*  K 3.1*  --    < > 4.0 3.8  CL 106  --    < > 108 113*  CO2 28  --    < > 30 30  GLUCOSE 147*  --    < > 121* 161*  BUN 43*  --    < > 43* 40*  CREATININE 1.99*  --    < > 1.44* 1.30*  CALCIUM 7.5*  --    < > 8.6* 8.2*  MG 2.5* 2.6*  --   --   --   PHOS 3.3 3.6  --   --   --    < > = values in this interval not displayed.    Lipid Panel:  Recent Labs  Lab 09/19/22 0528  CHOL 82  TRIG 92  HDL 22*  CHOLHDL 3.7  VLDL 18  LDLCALC 42    HgbA1c:  Recent Labs  Lab 09/19/22 1507  HGBA1C 5.5    Urine Drug Screen: No  results for input(s): "LABOPIA", "COCAINSCRNUR", "LABBENZ", "AMPHETMU", "THCU", "LABBARB" in the last 168 hours.  Alcohol Level  No results for input(s): "ETH" in the last 168 hours.  IMAGING past 24 hours MR BRAIN WO CONTRAST  Result Date: 09/25/2022 CLINICAL DATA:  Stroke EXAM: MRI HEAD WITHOUT CONTRAST TECHNIQUE: Multiplanar, multiecho pulse sequences of the brain and surrounding structures were obtained without intravenous contrast. COMPARISON:  None Available. FINDINGS: Brain: There are numerous bilateral foci of acute ischemia, greater in the right hemisphere, but affecting bilateral MCA and PCA territories as well as the cerebellum. There is associated cytotoxic edema. Multiple chronic microhemorrhages in a predominantly peripheral distribution. There is multifocal hyperintense T2-weighted signal within the white matter. Generalized volume loss. The midline structures are normal. Vascular: Major flow voids are preserved. Skull and upper cervical spine: Normal calvarium and skull base. Visualized upper cervical spine and soft tissues are normal.  Sinuses/Orbits:No paranasal sinus fluid levels or advanced mucosal thickening. No mastoid or middle ear effusion. Normal orbits. IMPRESSION: 1. Numerous bilateral foci of acute ischemia, greater in the right hemisphere, but affecting bilateral MCA and PCA territories as well as the cerebellum. Pattern is most consistent with a cardiac or proximal aortic embolic source. 2. Multiple chronic microhemorrhages in a predominantly peripheral distribution, possibly cerebral amyloid angiopathy. Electronically Signed   By: Deatra Robinson M.D.   On: 09/25/2022 01:41    PHYSICAL EXAM General: Ill appearing male sitting up in ICU bed HEENT: Head is atraumatic, central line in place right neck, secured with tegaderm.  Cardiovascular: No pedal edema, irregular rate and rhythm on cardiac monitor.   Mental status: Patient is awake but is not interactive with examiner. He does not follow commands. Does not answer orientation questions.  Cranial Nerves: PERRL 22mm/brisk. Patient does not track examiner throughout, oculocephalic reflex is intact, does not blink to threat on the left, inconsistent blink to threat on the right. Cough is intact.  Motor/sensory: No movement of LUE, minimal response to noxious stimuli to the LLE, spontaneous and antigravity movement of the RLE and RUE Gait: Deferred  ASSESSMENT/PLAN Mr. PEARL BERLINGER is a 81 y.o. male with history of AAA s/p repair, presenting with acute onset of severe left worse than RLE weakness in conjunction with left flank/back pain resulting in collapse at home. STAT CT head on presentation was with no acute abnormality. 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  He also developed transient postop atrial fibrillation but reverted to sinus rhythm after starting amiodarone drip.  Patient was extubated 11/9 and current stroke is  secondary to current cardiac surgery.   Stroke:  scattered areas of acute infarction in the right MCA territory could be due to aortic dissection extending to right CCA, postcardiac/aortic procedure, or postop A-fib.  CT head 11/6:  - Interval development of patchy small acute cortical/subcortical infarcts within the right MCA vascular territory. 4 mm focus of hemorrhage associated with an infarct within the anterior right frontal lobe. Adjacent small curvilinear focus of hyperdensity, also along the anterior right frontal lobe, which may reflect additional small-volume acute hemorrhage or hyperdense thrombus within a distal right MCA vessel. - New acute infarct within the right corona radiata/basal ganglia, measuring 1.9 x 2.2 cm. - Tiny infarct within the right cerebellar hemisphere, not definitively present on the prior head CT of 09/17/2022 and possibly acute. - Background mild chronic small vessel changes within the  cerebral white matter matter. - Mild generalized cerebral atrophy.  CT head repeat 1/6: Redemonstrated infarcts in the right MCA territory, including foci in the right precentral gyrus and left caudate/corona radiata, as well as a hemorrhagic focus in the anterior right frontal lobe. No evidence of increasing intraparenchymal hemorrhage. CT Head repeat 11/9: Scattered areas of acute infarct in the right MCA territory appear more prominent and slightly larger compared with 3 days ago. This is likely evolutionary change with edema in the infarct 4 mm focus of hemorrhage in the right anterolateral frontal lobe unchanged. No new hemorrhage. CTA head & neck  1. No large vessel occlusion. 2. Partially visualized known aortic dissection extending into the brachiocephalic artery, proximal right subclavian artery, and origin of the right common carotid artery. 3. Mild intracranial and cervical carotid atherosclerosis without significant proximal stenosis. 4. Small bilateral ICA  aneurysms. 5. Bilateral pleural effusions. MRI  multiple bilateral MCA/PCA and cerebellum infarcts TEE intraoperative: LVEF 60-65%, no LVH. No left atrial appendage thrombus. No atrial level shunt. There is evidence of dissection in the ascending aorta and aortic arch.  EEG 11/6: This study is suggestive of moderate to severe diffuse encephalopathy, nonspecific etiology. No seizures or epileptiform discharges were seen throughout the recording.  LDL 42 HgbA1c 5.5 VTE prophylaxis - SCDs No antithrombotic prior to admission, now on aspirin 81 mg daily. If evidence of Atrial fibrillation recurrence, will need to consider AC.  Therapy recommendations:  CIR Disposition:  Pending   Hypertension Home meds:  None  Stable Permissive hypertension (OK if < 220/120) but gradually normalize in 5-7 days Long-term BP goal normotensive  Hyperlipidemia Home meds:  none reported LDL 42, goal < 70 High intensity statin not indicated as patient is at goal  Other Stroke Risk Factors Advanced Age >/= 85   Other Active Problems Type 1 aortic dissection s/p repair   Hospital day # 8 Beulah Gandy DNP, ACNPC-AG     To contact Stroke Continuity provider, please refer to [email or web address] After hours, contact General Neurology

## 2022-09-25 NOTE — PMR Pre-admission (Signed)
PMR Admission Coordinator Pre-Admission Assessment  Patient: James Estrada is an 81 y.o., male MRN: [id number] DOB: 15-Nov-1940 Height: 6' (182.9 cm) Weight: 87.4 kg  Insurance Information HMO: yes    PPO:      PCP:      IPA:      80/20:      OTHER:  PRIMARY: BCBS Medicare      Policy#: [id number]      Subscriber: self CM Name: Festus Barren      Phone#: [phone]     Fax#: [phone] Pre-Cert#: TBD   approved for 7 days   Employer: Retired Benefits:  Phone #: [phone]     Name: 11/13 Eff. Date: 11/14/21     Deduct: none      Out of Pocket Max: $3950      Life Max: none CIR: $335 co pay per day days 1 until 5     SNF: no co pay per day days 1 until 20; $196 co pay per day days 21 until 60, no c co pay days 61 until 100 Outpatient: $10 per visit     Co-Pay: visits per medical neccesity Home Health: 100%      Co-Pay: visits per medical necessity DME: 80%     Co-Pay: 20% Providers: in network  SECONDARY:       Policy#:      Phone#:   Development worker, community:       Phone#:   The Engineer, petroleum" for patients in Inpatient Rehabilitation Facilities with attached "Privacy Act [place] Records" was provided and verbally reviewed with: Patient and Family  Emergency Contact Information Contact Information     Name Relation Home Work Mobile   [place] Spouse [phone]  [phone]   Herbie Drape Daughter   [phone]   Krishay, Allert   [phone]      Current Medical History  Patient Admitting Diagnosis: Aortic dissection, postop CVA  History of Present Illness: 81 year old male with history of known AAA  s/p stent graft, arthritis, bradycardia, HLD and HTN presented on 09/17/22. Sudden onset of bilateral leg weakness and collapse with chest and left upper back pain. CT angio shows Type 1 AAA dissection.   Emergent repair on 09/17/22. Postoperative CT head on 11/6 showed 3 small right sided CVAs in cerebellum, right basal ganglia  and frontal lobe with a 4 mm are of bleeding. CTA right carotid with dissection. Extubated on 09/22/22.   Heart Failure team consulted. Post operative PAF treated with IV amio and transitioned to PO. Felt no anticoagulation due to hemorrhagic component of Stroke. Pericardial effusion with repeat limited  echo 11/14 with trivial effusion. HTN treated with losartan and lopressor.   Developed sepsis on 11/17 that responded to IVF and antibiotics. Cultures negative. Wbcs as high as 51.8. Felt due to urinary retention and antibiotics narrowed to Zosyn. Foley placed 11/17, and began Flomax. Scr became elevated with sepsis that responded to Lasix.   Complete NIHSS TOTAL: 5  Patient's medical record from [facility] has been reviewed by the rehabilitation admission coordinator and physician.  Past Medical History  Past Medical History:  Diagnosis Date   AAA (abdominal aortic aneurysm) ([place])    last u/s done 07/18/17    Arthritis    Bradycardia    Dysrhythmia    frequent PAC, for 20 years   GERD (gastroesophageal reflux disease)    occ, OTC   Hemorrhoids    History of hiatal hernia    Hyperlipidemia  Hypertension    Seasonal allergies    Has the patient had major surgery during 100 days prior to admission? Yes  Family History   family history includes Aneurysm in his father; Coronary artery disease in his mother; Diabetes in his sister; Heart disease in his mother.  Current Medications  Current Facility-Administered Medications:    0.9 %  sodium chloride infusion, 250 mL, Intravenous, PRN, Loreli Slot, MD, Last Rate: 10 mL/hr at 10/01/22 0836, Restarted at 10/01/22 0836   amiodarone (PACERONE) tablet 200 mg, 200 mg, Oral, BID, Loreli Slot, MD, 200 mg at 10/04/22 1122   aspirin chewable tablet 81 mg, 81 mg, Oral, Daily, Mosetta Anis, RPH, 81 mg at 10/04/22 1122   atorvastatin (LIPITOR) tablet 40 mg, 40 mg, Oral, Daily, Loreli Slot, MD, 40 mg at 10/04/22  1122   Chlorhexidine Gluconate Cloth 2 % PADS 6 each, 6 each, Topical, Daily, Loreli Slot, MD, 6 each at 10/03/22 0909   [facility], Patient & Family Education, , Does not apply, Once, Loreli Slot, MD   feeding supplement (ENSURE ENLIVE / ENSURE PLUS) liquid 237 mL, 237 mL, Oral, BID BM, Loreli Slot, MD, 237 mL at 10/04/22 1122   lactated ringers infusion, , Intravenous, Continuous, Icard, Bradley L, DO, Stopped at 10/03/22 0855   levalbuterol (XOPENEX) nebulizer solution 0.63 mg, 0.63 mg, Nebulization, Q6H PRN, Loreli Slot, MD   metoprolol tartrate (LOPRESSOR) tablet 12.5 mg, 12.5 mg, Oral, BID, 12.5 mg at 10/04/22 1122 **OR** [DISCONTINUED] metoprolol tartrate (LOPRESSOR) 25 mg/10 mL oral suspension 25 mg, 25 mg, Per Tube, BID, Loreli Slot, MD, 25 mg at 09/29/22 0950   ondansetron (ZOFRAN) injection 4 mg, 4 mg, Intravenous, Q6H PRN, Loreli Slot, MD   Oral care mouth rinse, 15 mL, Mouth Rinse, PRN, Loreli Slot, MD   piperacillin-tazobactam (ZOSYN) IVPB 3.375 g, 3.375 g, Intravenous, Q8H, Bensimhon, Bevelyn Buckles, MD, Last Rate: 12.5 mL/hr at 10/04/22 0520, 3.375 g at 10/04/22 0520   sodium chloride flush (NS) 0.9 % injection 3 mL, 3 mL, Intravenous, Q12H, Loreli Slot, MD, 3 mL at 10/02/22 0957   sodium chloride flush (NS) 0.9 % injection 3 mL, 3 mL, Intravenous, PRN, Loreli Slot, MD   tamsulosin [facility]) capsule 0.4 mg, 0.4 mg, Oral, Daily, Lightfoot, Harrell O, MD, 0.4 mg at 10/04/22 1122   traMADol (ULTRAM) tablet 50-100 mg, 50-100 mg, Oral, Q4H PRN, Loreli Slot, MD, 50 mg at 09/26/22 1622  Patients Current Diet:  Diet Order             DIET DYS 2 Room service appropriate? Yes with Assist; Fluid consistency: Thin  Diet effective now                  Precautions / Restrictions Precautions Precautions: Fall, Other (comment), Sternal Precaution Booklet Issued: No Precaution  Comments: fecal incontinence, L inattention Restrictions Weight Bearing Restrictions: Yes RUE Weight Bearing:  (sternal precautions) LUE Weight Bearing:  (sternal precautions) Other Position/Activity Restrictions: sternal precautions   Has the patient had 2 or more falls or a fall with injury in the past year? No  Prior Activity Level Community (5-7x/wk): Worked out daily, was very active.  Prior Functional Level Self Care: Did the patient need help bathing, dressing, using the toilet or eating? Independent  Indoor Mobility: Did the patient need assistance with walking from room to room (with or without device)? Independent  Stairs: Did the patient  need assistance with internal or external stairs (with or without device)? Independent  Functional Cognition: Did the patient need help planning regular tasks such as shopping or remembering to take medications? Independent  Patient Information Are you of Hispanic, Latino/a,or Spanish origin?: A. No, not of Hispanic, Latino/a, or Spanish origin What is your race?: A. White Do you need or want an interpreter to communicate with a doctor or health care staff?: 0. No  Patient's Response To:  Health Literacy and Transportation Is the patient able to respond to health literacy and transportation needs?: Yes Health Literacy - How often do you need to have someone help you when you read instructions, pamphlets, or other written material from your doctor or pharmacy?: Never In the past 12 months, has lack of transportation kept you from medical appointments or from getting medications?: No In the past 12 months, has lack of transportation kept you from meetings, work, or from getting things needed for daily living?: No  Development worker, international aid / [place] Devices/Equipment: None Home Equipment: None  Prior Device Use: Indicate devices/aids used by the patient prior to current illness, exacerbation or injury? None of the  above  Current Functional Level Cognition  Arousal/Alertness: Awake/alert Overall Cognitive Status: Difficult to assess Difficult to assess due to: Impaired communication Current Attention Level: Sustained Orientation Level: Oriented to person, Oriented to situation Following Commands: Follows one step commands inconsistently, Follows one step commands with increased time Safety/Judgement: Decreased awareness of safety, Decreased awareness of deficits General Comments: pt demonstrating improving ability to engage with therapist.  After introduction, pt voicing "I'm [name]".  Pt able to voice spouse and grandson via picture on L side, but requires increased time.  He is scanning more towards L side and holding his L UE with his R, indicating improved L attention.  FOllows some 1 step commands but remains inconsistent. No carryover with sternal precautions Family reports he is brushing his teeth using R Hand and he was singing lyrics of songs upon exit. Poor motor planning still. Attention: Focused, Sustained Focused Attention: Appears intact Sustained Attention: Impaired Sustained Attention Impairment: Verbal basic, Functional basic Memory: Impaired Memory Impairment: Retrieval deficit Awareness: Impaired Awareness Impairment: Intellectual impairment    Extremity Assessment (includes Sensation/Coordination)  Upper Extremity Assessment: LUE deficits/detail, RUE deficits/detail RUE Deficits / Details: Seemingly WFL. LUE Deficits / Details: L sensory motor deficits, moving through ~50% of range against gravity. poor prioprioception coupled with L inattention LUE Sensation: decreased proprioception LUE Coordination: decreased fine motor, decreased gross motor  Lower Extremity Assessment: Defer to PT evaluation RLE Deficits / Details: inconsistently performed knee ext AROM when cued with increased time LLE Deficits / Details: minimal AROM present in bed upon entry, did not observe any  volitional movement during session    ADLs  Overall ADL's : Needs assistance/impaired Grooming: Minimal assistance, Sitting, Wash/dry face Grooming Details (indicate cue type and reason): washing face with R UE, hand over hand to engage L UE Lower Body Dressing: Total assistance, +2 for physical assistance, +2 for safety/equipment, Sit to/from stand Lower Body Dressing Details (indicate cue type and reason): assist to don socks, +2 in standing Toilet Transfer: Maximal assistance, +2 for physical assistance, +2 for safety/equipment Toilet Transfer Details (indicate cue type and reason): simulated bed to chair, mod assist +2 to stand but requires max assist to sequence, motor plan to pivot to recliner . Toileting- Clothing Manipulation and Hygiene: Maximal assistance, +2 for safety/equipment, +2 for physical assistance Toileting - Clothing Manipulation Details (  indicate cue type and reason): incontinent upon standing, max A +2 for standing balance and hygiene Functional mobility during ADLs: Moderate assistance, Maximal assistance, +2 for physical assistance, +2 for safety/equipment, Cueing for sequencing General ADL Comments: session limited by incontinent BM. R field occulsion glasses donned with some improvement noted in midline gaze    Mobility  Overal bed mobility: Needs Assistance Bed Mobility: Rolling, Sidelying to Sit, Sit to Supine Rolling: Min assist Sidelying to sit: Min assist, +2 for physical assistance, +2 for safety/equipment Supine to sit: Max assist, +2 for physical assistance Sit to supine: Max assist, +2 for physical assistance, +2 for safety/equipment, HOB elevated General bed mobility comments: min assist for rolling and seqeucing sidelying to sitting. cueing for technique and increased time required to process.    Transfers  Overall transfer level: Needs assistance Equipment used: Rolling walker (2 wheels), 2 person hand held assist Transfers: Sit to/from Stand, Bed to  chair/wheelchair/BSC Sit to Stand: Mod assist, +2 physical assistance, +2 safety/equipment Bed to/from chair/wheelchair/BSC transfer type:: Step pivot Stand pivot transfers: Max assist, +2 physical assistance, +2 safety/equipment Squat pivot transfers: Mod assist, +2 physical assistance, +2 safety/equipment Step pivot transfers: Max assist, +2 physical assistance, +2 safety/equipment General transfer comment: pt standing at EOB mulitple times with mod assist +2, utilized RW and bilateral hand held assist but difficulty sequencing/motor planning stepping with LEs, max asisst +2 to weightshift and advance LEs and maintain balance to reach chair with bil HHA. L knee instability noted, needing blocking intermittently, but much less frequent buckling and no hyperextension noted today    Ambulation / Gait / Stairs / Wheelchair Mobility  Ambulation/Gait Ambulation/Gait assistance: Max assist, +2 physical assistance, +2 safety/equipment Gait Distance (Feet): 1 Feet Assistive device: 2 person hand held assist Gait Pattern/deviations: Step-to pattern, Decreased stride length, Decreased weight shift to right, Decreased weight shift to left, Knees buckling, Trunk flexed, Leaning posteriorly General Gait Details: max asisst +2 to weightshift and advance LEs and maintain balance to reach chair with bil HHA. L knee instability noted, needing blocking intermittently, but much less frequent buckling and no hyperextension noted today Gait velocity: reduced Gait velocity interpretation: <1.31 ft/sec, indicative of household ambulator Pre-gait activities: ACE wrap applied to L knee to prevent hyperextension, success noted. Practiced lateral weight shifting with noted L quads activation intermittently when shifting to L and VCs provided to stand up tall or straighten knees and less knee block provided. Practiced stepping anterior and posterior with L foot 1x, cuing and providing maxAx2 to weight shift to R and move L  leg. Practiced stepping to R to [facility] 1-2 steps each foot, maxAx2 to weight shift, prevent posterior LOB, and manage L leg.    Posture / Balance Dynamic Sitting Balance Sitting balance - Comments: min guard to close supervision, needs cues to place hands on lap instead of pushing through bed Balance Overall balance assessment: Needs assistance Sitting-balance support: Single extremity supported, Bilateral upper extremity supported, No upper extremity supported, Feet supported Sitting balance-Leahy Scale: Fair Sitting balance - Comments: min guard to close supervision, needs cues to place hands on lap instead of pushing through bed Postural control: Right lateral lean, Posterior lean Standing balance support: Bilateral upper extremity supported, During functional activity Standing balance-Leahy Scale: Poor Standing balance comment: relies on BUE and external support    Special needs/care consideration  Fall precautions due to decreased safety awareness    Previous Home Environment  Living Arrangements: Spouse/significant other  Lives With: Spouse Available Help at  Discharge: Family, Available 24 hours/day Type of Home: House Home Layout: Two level, Able to live on main level with bedroom/bathroom Home Access: Stairs to enter Entrance Stairs-Number of Steps: 3 Bathroom Shower/Tub: Optometrist: Yes [place]: No Additional Comments: pt lives in multilevel home; daughter Kathlee Nations) states pt would likely d/c to her home so he could live on one level and she can provide 24/7 assist as needed (home set-up of daughter's home; she works in Personal assistant and has flexible hours)  Discharge Living Setting Plans for Discharge Living Setting: House, Lives with (comment) (Plans to go home with daughter.) Type of Home at Discharge: House Discharge Home Layout: Two level, Able to live on main level with bedroom/bathroom Alternate Level  Stairs-Number of Steps: Flight (Can stay on main level.) Discharge Home Access: Stairs to enter Entrance Stairs-Number of Steps: 3 steps front entry and 1 step at garage entry with no rails. Discharge Bathroom Shower/Tub: Tub/shower unit, Curtain Discharge Bathroom Toilet: Standard Discharge Bathroom Accessibility: No (Wife Not sure if walker can get into bathroom) Does the patient have any problems obtaining your medications?: No  Social/Family/Support Systems Patient Roles: Spouse, Parent (Has wife, daughter, son-in-law and 3 grand children) Contact Information: Traver Fazzini - wife - [phone] Anticipated Caregiver: Wife and daughter Anticipated Caregiver's Contact Information: Herbie Drape - dtr [phone] Ability/Limitations of Caregiver: Wife and Dtr can assist.  Dtr can work from home if needed. Caregiver Availability: 24/7 Discharge Plan Discussed with Primary Caregiver: Yes Is Caregiver In Agreement with Plan?: Yes Does Caregiver/Family have Issues with Lodging/Transportation while Pt is in Rehab?: No  Goals Patient/Family Goal for Rehab: PT/OT/SLP supervision goals Expected length of stay: 12 days Pt/Family Agrees to Admission and willing to participate: Yes Program Orientation Provided & Reviewed with Pt/Caregiver Including Roles  & Responsibilities: Yes  Decrease burden of Care through IP rehab admission: N/A  Possible need for SNF placement upon discharge: Not planned  Patient Condition: I have reviewed medical records from [facility], spoken with CM, and spouse. I discussed via phone for inpatient rehabilitation assessment.  Patient will benefit from ongoing PT, OT, and SLP, can actively participate in 3 hours of therapy a day 5 days of the week, and can make measurable gains during the admission.  Patient will also benefit from the coordinated team approach during an Inpatient Acute Rehabilitation admission.  The patient will receive intensive therapy as  well as Rehabilitation physician, nursing, social worker, and care management interventions.  Due to bladder management, bowel management, safety, skin/wound care, disease management, medication administration, pain management, and patient education the patient requires 24 hour a day rehabilitation nursing.  The patient is currently mod to max assist with mobility and basic ADLs.  Discharge setting and therapy post discharge at home with home health is anticipated.  Patient has agreed to participate in the Acute Inpatient Rehabilitation Program and will admit today.  Preadmission Screen Completed [id number], Audelia Acton, 10/04/2022 11:56 AM ______________________________________________________________________   Discussed status with Dr. Dagoberto Ligas on 10/04/22 at 1209 and received approval for admission today.  Admission Coordinator:  Cleatrice Burke, RN, time 1209 Date 10/04/22   Assessment/Plan: Diagnosis: Does the need for close, 24 hr/day Medical supervision in concert with the patient's rehab needs make it unreasonable for this patient to be served in a less intensive setting? Yes Co-Morbidities requiring supervision/potential complications: AAA dissection, post op Afib with RVR, 3 R brain strokes; sepsis with UTI- WBC down form 51 to  16k Due to bladder management, bowel management, safety, skin/wound care, disease management, medication administration, pain management, and patient education, does the patient require 24 hr/day rehab nursing? Yes Does the patient require coordinated care of a physician, rehab nurse, PT, OT, and SLP to address physical and functional deficits in the context of the above medical diagnosis(es)? Yes Addressing deficits in the following areas: balance, endurance, locomotion, strength, transferring, bowel/bladder control, bathing, dressing, feeding, grooming, toileting, cognition, speech, language, and swallowing Can the patient actively participate in an  intensive therapy program of at least 3 hrs of therapy 5 days a week? Yes The potential for patient to make measurable gains while on inpatient rehab is good Anticipated functional outcomes upon discharge from inpatient rehab: supervision PT, supervision OT, supervision SLP Estimated rehab length of stay to reach the above functional goals is: 12-14 days Anticipated discharge destination: Home 10. Overall Rehab/Functional Prognosis: good   MD Signature:

## 2022-09-25 NOTE — Progress Notes (Signed)
Advanced Heart Failure Rounding Note  PCP-Cardiologist: None   Subjective:    POD 8 S/p type 1 Aortic dissection repair.  Head CT 11/6 showed small  acute cortical/subcortical infarcts within the right MCA vascular territory. 4 mm focus of hemorrhage associated with an infarct within the anterior right frontal lobe.  11/9 Extubated.  Sitting up in bed. Awake. Will follow some commands. Moves all 4. Weak on left.     Objective:   Weight Range: 81.4 kg Body mass index is 24.34 kg/m.   Vital Signs:   Temp:  [98.2 F (36.8 C)-99.1 F (37.3 C)] 98.6 F (37 C) (11/12 0735) Pulse Rate:  [62-70] 62 (11/12 0800) Resp:  [13-31] 23 (11/12 0800) BP: (112-144)/(51-85) 122/85 (11/12 0800) SpO2:  [92 %-100 %] 97 % (11/12 0852) Weight:  [81.4 kg] 81.4 kg (11/12 0400) Last BM Date : 09/23/22  Weight change: Filed Weights   09/23/22 0500 09/24/22 0500 09/25/22 0400  Weight: 82.8 kg 84.3 kg 81.4 kg    Intake/Output:   Intake/Output Summary (Last 24 hours) at 09/25/2022 1009 Last data filed at 09/25/2022 0800 Gross per 24 hour  Intake 1995.93 ml  Output 1350 ml  Net 645.93 ml       Physical Exam   General:  Sitting up in bed. Weak appearing. No resp difficulty HEENT: normal + Cor-trak Neck: supple. no JVD. Carotids 2+ bilat; no bruits. No lymphadenopathy or thryomegaly appreciated. Cor: PMI nondisplaced. Regular rate & rhythm. No rubs, gallops or murmurs. Lungs: clear Abdomen: soft, nontender, nondistended. No hepatosplenomegaly. No bruits or masses. Good bowel sounds. Extremities: no cyanosis, clubbing, rash, edema Neuro: will folow some commands but not relaibly. Weak on left   Telemetry   NSR 60s   Labs    CBC Recent Labs    09/23/22 0418 09/24/22 0419  WBC 14.0* 14.4*  HGB 8.3* 8.0*  HCT 26.0* 26.6*  MCV 90.6 95.3  PLT 78* 118*    Basic Metabolic Panel Recent Labs    78/29/56 0418 09/24/22 0419  NA 149* 149*  K 4.0 3.8  CL 108 113*  CO2 30  30  GLUCOSE 121* 161*  BUN 43* 40*  CREATININE 1.44* 1.30*  CALCIUM 8.6* 8.2*    Liver Function Tests No results for input(s): "AST", "ALT", "ALKPHOS", "BILITOT", "PROT", "ALBUMIN" in the last 72 hours.  No results for input(s): "LIPASE", "AMYLASE" in the last 72 hours. Cardiac Enzymes No results for input(s): "CKTOTAL", "CKMB", "CKMBINDEX", "TROPONINI" in the last 72 hours.  BNP: BNP (last 3 results) No results for input(s): "BNP" in the last 8760 hours.  ProBNP (last 3 results) No results for input(s): "PROBNP" in the last 8760 hours.   D-Dimer No results for input(s): "DDIMER" in the last 72 hours.  Hemoglobin A1C No results for input(s): "HGBA1C" in the last 72 hours.  Fasting Lipid Panel No results for input(s): "CHOL", "HDL", "LDLCALC", "TRIG", "CHOLHDL", "LDLDIRECT" in the last 72 hours.  Thyroid Function Tests No results for input(s): "TSH", "T4TOTAL", "T3FREE", "THYROIDAB" in the last 72 hours.  Invalid input(s): "FREET3"  Other results:   Imaging    MR BRAIN WO CONTRAST  Result Date: 09/25/2022 CLINICAL DATA:  Stroke EXAM: MRI HEAD WITHOUT CONTRAST TECHNIQUE: Multiplanar, multiecho pulse sequences of the brain and surrounding structures were obtained without intravenous contrast. COMPARISON:  None Available. FINDINGS: Brain: There are numerous bilateral foci of acute ischemia, greater in the right hemisphere, but affecting bilateral MCA and PCA territories as well as the  cerebellum. There is associated cytotoxic edema. Multiple chronic microhemorrhages in a predominantly peripheral distribution. There is multifocal hyperintense T2-weighted signal within the white matter. Generalized volume loss. The midline structures are normal. Vascular: Major flow voids are preserved. Skull and upper cervical spine: Normal calvarium and skull base. Visualized upper cervical spine and soft tissues are normal. Sinuses/Orbits:No paranasal sinus fluid levels or advanced mucosal  thickening. No mastoid or middle ear effusion. Normal orbits. IMPRESSION: 1. Numerous bilateral foci of acute ischemia, greater in the right hemisphere, but affecting bilateral MCA and PCA territories as well as the cerebellum. Pattern is most consistent with a cardiac or proximal aortic embolic source. 2. Multiple chronic microhemorrhages in a predominantly peripheral distribution, possibly cerebral amyloid angiopathy. Electronically Signed   By: Ulyses Jarred M.D.   On: 09/25/2022 01:41     Medications:     Scheduled Medications:  acetaminophen  1,000 mg Oral Q6H   Or   acetaminophen (TYLENOL) oral liquid 160 mg/5 mL  1,000 mg Per Tube Q6H   acetaminophen (TYLENOL) oral liquid 160 mg/5 mL  650 mg Per Tube Once   Or   acetaminophen  650 mg Rectal Once   amiodarone  200 mg Per Tube BID   aspirin  81 mg Per Tube Daily   bisacodyl  10 mg Oral Daily   Or   bisacodyl  10 mg Rectal Daily   Chlorhexidine Gluconate Cloth  6 each Topical Daily   docusate  200 mg Per Tube Daily   feeding supplement (PROSource TF20)  60 mL Per Tube Daily   free water  200 mL Per Tube Q4H   insulin aspart  0-24 Units Subcutaneous Q4H   metoprolol tartrate  12.5 mg Oral BID   Or   metoprolol tartrate  12.5 mg Per Tube BID   sodium chloride flush  10-40 mL Intracatheter Q12H   sodium chloride flush  10-40 mL Intracatheter Q12H   sodium chloride flush  3 mL Intravenous Q12H    Infusions:  sodium chloride     sodium chloride Stopped (09/24/22 1043)   feeding supplement (PEPTAMEN 1.5 CAL)     lactated ringers     lactated ringers Stopped (09/20/22 1403)    PRN Medications: sodium chloride, levalbuterol, metoprolol tartrate, ondansetron (ZOFRAN) IV, mouth rinse, mouth rinse, sodium chloride flush, sodium chloride flush, sodium chloride flush, traMADol    Patient Profile   81 y/o male w/ h/o HTN, HLD, AAA s/p stent graft admitted w/ Type 1 Aortic Dissection, s/p emergent repair.   Assessment/Plan   1.  Type 1 Aortic Dissection  - s/p emergent repair 11/4 - management per CT surgery  - holding ASA w/ thrombocytopenia    2. Hypertension  - Stable. Avoid hypotension   3. HFpEF - Intra-op TEE EF 60-65%, RV ok - As expected volume up, post large blood/ fluid resuscitation due to problem #1 + RBP.  - CVP low. Holding diuretics  4. ABLA/ Thrombocytopenia  - dissection w/ RBP + expected blood loss in OR - required multiple units of PRBC's, Plts, FFP, and Cryoprecipitate in OR - No CBC today. Recheck in am    5. Post-Op PAF - in NSR. Continue po amio - no AC with hemorrhagic component of stroke   6. Pericardial Effusion  - mod by Intra-op TEE, no tamponade - hemodynamics stable - wlil repeat limited echo this week   7. Acute Hypoxic Respiratory Failure - Extubated 11/9 to 4 liters [place].    8. Rt Sided  Strokes   - CT head 11/6 showed 3 small right-sided strokes 1 in the cerebellum 1 in the right basal ganglia and 1 in the frontal lobe with a 4 mm area of bleeding  - MRI last night stable - CTA - R carotid involved with dissection   9. AKI  - B/l Scr ~1.0>>spiked to 1.90  - suspect ATN from hemorrhagic shock/hypotension, now post resuscitation - Resolving. Scr 1.3 yesterday. Recheck today  10. Hypokalemia - Stable.   11. Hypernatremia  - Continue FW  Continue Neuro/PT/OT support.   Agree with CIR, if he qualifies. Should be ready soon.   Length of Stay: Advance, MD  09/25/2022, 10:09 AM  Advanced Heart Failure Team Pager [phone] (M-F; 7a - 5p)  Please contact [facility] for night-coverage after hours (5p -7a ) and weekends on [email or web address]

## 2022-09-26 DIAGNOSIS — I4891 Unspecified atrial fibrillation: Secondary | ICD-10-CM | POA: Diagnosis not present

## 2022-09-26 DIAGNOSIS — I6340 Cerebral infarction due to embolism of unspecified cerebral artery: Secondary | ICD-10-CM | POA: Diagnosis not present

## 2022-09-26 DIAGNOSIS — R58 Hemorrhage, not elsewhere classified: Secondary | ICD-10-CM | POA: Diagnosis not present

## 2022-09-26 DIAGNOSIS — I7103 Dissection of thoracoabdominal aorta: Secondary | ICD-10-CM | POA: Diagnosis not present

## 2022-09-26 LAB — BASIC METABOLIC PANEL
Anion gap: 7 (ref 5–15)
BUN: 39 mg/dL — ABNORMAL HIGH (ref 8–23)
CO2: 28 mmol/L (ref 22–32)
Calcium: 9.1 mg/dL (ref 8.9–10.3)
Chloride: 118 mmol/L — ABNORMAL HIGH (ref 98–111)
Creatinine, Ser: 1.14 mg/dL (ref 0.61–1.24)
GFR, Estimated: >60 mL/min (ref >60)
Glucose, Bld: 165 mg/dL — ABNORMAL HIGH (ref 70–99)
Potassium: 4.1 mmol/L (ref 3.5–5.1)
Sodium: 153 mmol/L — ABNORMAL HIGH (ref 135–145)

## 2022-09-26 LAB — GLUCOSE, CAPILLARY
Glucose-Capillary: 154 mg/dL — ABNORMAL HIGH (ref 70–99)
Glucose-Capillary: 166 mg/dL — ABNORMAL HIGH (ref 70–99)
Glucose-Capillary: 162 mg/dL — ABNORMAL HIGH (ref 70–99)
Glucose-Capillary: 158 mg/dL — ABNORMAL HIGH (ref 70–99)
Glucose-Capillary: 169 mg/dL — ABNORMAL HIGH (ref 70–99)

## 2022-09-26 NOTE — Progress Notes (Signed)
[address]       [place] [address]             [phone]      Sleeping currently Met with wife and daughter Was answering some questions appropriately and some not earlier  Revonda Standard. Roxan Hockey, MD [facility] [phone]

## 2022-09-26 NOTE — Progress Notes (Signed)
Advanced Heart Failure Rounding Note  PCP-Cardiologist: None   Subjective:    Sitting up in chair. Able to follow some commands but not reliably. Weak on left.   Rhythm and BP stable    Objective:   Weight Range: 81.6 kg Body mass index is 24.4 kg/m.   Vital Signs:   Temp:  [97.5 F (36.4 C)-98.8 F (37.1 C)] 97.9 F (36.6 C) (11/13 0700) Pulse Rate:  [62-71] 68 (11/13 0800) Resp:  [16-29] 23 (11/13 0800) BP: (133-163)/(59-81) 163/81 (11/13 0800) SpO2:  [89 %-100 %] 93 % (11/13 0800) Weight:  [81.6 kg] 81.6 kg (11/13 0500) Last BM Date : 09/23/22  Weight change: Filed Weights   09/24/22 0500 09/25/22 0400 09/26/22 0500  Weight: 84.3 kg 81.4 kg 81.6 kg    Intake/Output:   Intake/Output Summary (Last 24 hours) at 09/26/2022 0954 Last data filed at 09/26/2022 0800 Gross per 24 hour  Intake 1936.5 ml  Output 1300 ml  Net 636.5 ml       Physical Exam   General:  Weak appearing. No resp difficulty HEENT: normal + cor-trak Neck: supple. no JVD. Carotids 2+ bilat; no bruits. No lymphadenopathy or thryomegaly appreciated. Cor: PMI nondisplaced. Regular rate & rhythm. No rubs, gallops or murmurs. Lungs: clear Abdomen: soft, nontender, nondistended. No hepatosplenomegaly. No bruits or masses. Good bowel sounds. Extremities: no cyanosis, clubbing, rash, edema Neuro: alert follows some commands. Weak on left    Telemetry   NSR 60-70s Personally reviewed  Labs    CBC Recent Labs    09/24/22 0419  WBC 14.4*  HGB 8.0*  HCT 26.6*  MCV 95.3  PLT 118*    Basic Metabolic Panel Recent Labs    09/24/22 0419  NA 149*  K 3.8  CL 113*  CO2 30  GLUCOSE 161*  BUN 40*  CREATININE 1.30*  CALCIUM 8.2*    Liver Function Tests No results for input(s): "AST", "ALT", "ALKPHOS", "BILITOT", "PROT", "ALBUMIN" in the last 72 hours.  No results for input(s): "LIPASE", "AMYLASE" in the last 72 hours. Cardiac Enzymes No results for input(s): "CKTOTAL",  "CKMB", "CKMBINDEX", "TROPONINI" in the last 72 hours.  BNP: BNP (last 3 results) No results for input(s): "BNP" in the last 8760 hours.  ProBNP (last 3 results) No results for input(s): "PROBNP" in the last 8760 hours.   D-Dimer No results for input(s): "DDIMER" in the last 72 hours.  Hemoglobin A1C No results for input(s): "HGBA1C" in the last 72 hours.  Fasting Lipid Panel No results for input(s): "CHOL", "HDL", "LDLCALC", "TRIG", "CHOLHDL", "LDLDIRECT" in the last 72 hours.  Thyroid Function Tests No results for input(s): "TSH", "T4TOTAL", "T3FREE", "THYROIDAB" in the last 72 hours.  Invalid input(s): "FREET3"  Other results:   Imaging    No results found.   Medications:     Scheduled Medications:  acetaminophen  1,000 mg Oral Q6H   Or   acetaminophen (TYLENOL) oral liquid 160 mg/5 mL  1,000 mg Per Tube Q6H   amiodarone  200 mg Per Tube BID   aspirin  81 mg Per Tube Daily   Chlorhexidine Gluconate Cloth  6 each Topical Daily   feeding supplement (PROSource TF20)  60 mL Per Tube Daily   free water  200 mL Per Tube Q4H   insulin aspart  0-24 Units Subcutaneous Q4H   metoprolol tartrate  12.5 mg Oral BID   Or   metoprolol tartrate  12.5 mg Per Tube BID   sodium chloride flush  10-40 mL Intracatheter Q12H   sodium chloride flush  10-40 mL Intracatheter Q12H   sodium chloride flush  3 mL Intravenous Q12H    Infusions:  sodium chloride     sodium chloride Stopped (09/24/22 1043)   feeding supplement (PEPTAMEN 1.5 CAL) 55 mL/hr at 09/26/22 0800   lactated ringers     lactated ringers Stopped (09/20/22 1403)    PRN Medications: sodium chloride, levalbuterol, metoprolol tartrate, ondansetron (ZOFRAN) IV, mouth rinse, mouth rinse, sodium chloride flush, sodium chloride flush, sodium chloride flush, traMADol    Patient Profile   81 y/o male w/ h/o HTN, HLD, AAA s/p stent graft admitted w/ Type 1 Aortic Dissection, s/p emergent repair.   Assessment/Plan    1. Type 1 Aortic Dissection  - s/p emergent repair 11/4 - management per CT surgery    2. Hypertension  - Stable. Avoid hypotension - Letting BP run a little with recent CVA   3. HFpEF - Intra-op TEE EF 60-65%, RV ok - CVP low. Holding diuretics  4. ABLA/ Thrombocytopenia  - dissection w/ RBP + expected blood loss in OR - required multiple units of PRBC's, Plts, FFP, and Cryoprecipitate in OR   5. Post-Op PAF - in NSR. Continue po amio - no AC with hemorrhagic component of stroke   6. Pericardial Effusion  - mod by Intra-op TEE, no tamponade - hemodynamics stable - wlil repeat limited echo this week   7. Acute Hypoxic Respiratory Failure - Extubated 11/9 to 4 liters [place].    8. Rt Sided Strokes   - CT head 11/6 showed 3 small right-sided strokes 1 in the cerebellum 1 in the right basal ganglia and 1 in the frontal lobe with a 4 mm area of bleeding  - MRI last night stable - CTA - R carotid involved with dissection   9. AKI  - B/l Scr ~1.0>>spiked to 1.90  - suspect ATN from hemorrhagic shock/hypotension, now post resuscitation - Resolved  10. Hypokalemia - Stable.  - Needs BMET today  11. Hypernatremia  - Continue FW - BMET today  Continue Neuro/PT/OT support.   Agree with CIR, if he qualifies. He is ready to go there from my standpoint.   Length of Stay: 9  Arvilla Meres, MD  09/26/2022, 9:54 AM  Advanced Heart Failure Team Pager [phone] (M-F; 7a - 5p)  Please contact [facility] for night-coverage after hours (5p -7a ) and weekends on [email or web address]

## 2022-09-26 NOTE — Progress Notes (Signed)
9 Days Post-Op Procedure(s) (LRB): REPAIR OF ACUTE ASCENDING THORACIC AORTIC DISSECTION USING 28 MM HEMASHIELD PLATINUM VASCULAR GRAFT (N/A) Subjective: Sleeping but easily awakened  Objective: Vital signs in last 24 hours: Temp:  [97.5 F (36.4 C)-98.8 F (37.1 C)] 97.9 F (36.6 C) (11/13 0700) Pulse Rate:  [62-71] 67 (11/13 0700) Resp:  [16-30] 21 (11/13 0700) BP: (122-160)/(59-118) 143/79 (11/13 0700) SpO2:  [89 %-100 %] 89 % (11/13 0700) Weight:  [81.6 kg] 81.6 kg (11/13 0500)  Hemodynamic parameters for last 24 hours:    Intake/Output from previous day: 11/12 0701 - 11/13 0700 In: 2191.5 [NG/GT:2191.5] Out: 1300 [Urine:1300] Intake/Output this shift: No intake/output data recorded.  General appearance: alert, cooperative, and no distress Neurologic: left sided weakness Heart: regular rate and rhythm Lungs: diminished breath sounds bibasilar Wound: clean and dry  Lab Results: Recent Labs    09/24/22 0419  WBC 14.4*  HGB 8.0*  HCT 26.6*  PLT 118*   BMET:  Recent Labs    09/24/22 0419  NA 149*  K 3.8  CL 113*  CO2 30  GLUCOSE 161*  BUN 40*  CREATININE 1.30*  CALCIUM 8.2*    PT/INR: No results for input(s): "LABPROT", "INR" in the last 72 hours. ABG    Component Value Date/Time   PHART 7.479 (H) 09/20/2022 0830   HCO3 25.2 09/20/2022 0830   TCO2 26 09/20/2022 0830   ACIDBASEDEF 1.0 09/18/2022 1629   O2SAT 94 09/20/2022 0830   CBG (last 3)  Recent Labs    09/25/22 1941 09/25/22 2342 09/26/22 0328  GLUCAP 166* 171* 154*    Assessment/Plan: S/P Procedure(s) (LRB): REPAIR OF ACUTE ASCENDING THORACIC AORTIC DISSECTION USING 28 MM HEMASHIELD PLATINUM VASCULAR GRAFT (N/A) - NEURO-  exam essentially unchanged. Responsive and follows simple commands. Left sided weakness. Moving left hand more, but not following commands with it.  CV- in Sr on amiodarone  BP a little high to support brain perfusion  RESP- good oxygenation  Recheck CXR in  AM  RENAL- no labs this AM  Repeat BMET in AM  ENDO- CBG mildly elevated  Continue SSI  GI= tolerating TF.   ID- no signs of infection  Continue PT/OT   LOS: 9 days    Loreli Slot 09/26/2022

## 2022-09-26 NOTE — Progress Notes (Signed)
  Inpatient Rehabilitation Admissions Coordinator   I will begin Auth with [facility] Medicare for possible Cir admit.  Ottie Glazier, RN, MSN Rehab Admissions Coordinator [phone] 09/26/2022 1:20 PM

## 2022-09-26 NOTE — Progress Notes (Signed)
Occupational Therapy Treatment Patient Details Name: James Estrada MRN: [id number] DOB: 25-Jun-1941 Today's Date: 09/26/2022   History of present illness Pt is a 81 y/o M admitted to [facility] on 09/17/22 with BLE weakness, chest + back pain, admitted with CT angio shows type dissection with thrombosed false lumen. Repair of type 1 aortic dissection performed 11/4. Head CT 11/6 acute infarcts within R MCA involvement, including R cerebellum, basal ganglia, frontal lobe. ETT 11/4-11/9. PMH AAA s/p endovascular repair (2020), HTN, HLD, GERD.   OT comments  James Estrada is making great progress, pt seen with PT to safely progress OOB. Upon arrival pt noted to be moving his LUE against gravity to itch his face, however he did not move L hemibody to command. He required max  +2 to transfer to sitting EOB and progressed to min G sitting balance. Pt used his non-dominant hand to wash his face and brush teeth with mod A for cues, initiation and support. Attempted to stand with max A +2, pt unable to get fully upright. He then was transferred from bed>chair via total A +2 squat pivot. Pt followed some commands and vocalized appropriately 5 times this date with 3-5 second delay for processing. OT to continue to follow acutely, potentially plan for R field occulusion glasses next session to encourage midline gaze.    Recommendations for follow up therapy are one component of a multi-disciplinary discharge planning process, led by the attending physician.  Recommendations may be updated based on patient status, additional functional criteria and insurance authorization.    Follow Up Recommendations  Acute inpatient rehab (3hours/day)     Assistance Recommended at Discharge Frequent or constant Supervision/Assistance  Patient can return home with the following  Two people to help with walking and/or transfers;Two people to help with bathing/dressing/bathroom;Assistance with cooking/housework;Assistance with  feeding;Direct supervision/assist for medications management;Direct supervision/assist for financial management;Assist for transportation;Help with stairs or ramp for entrance   Equipment Recommendations  Other (comment)       Precautions / Restrictions Precautions Precautions: Fall;Other (comment);Sternal Precaution Booklet Issued: No Precaution Comments: Fecal collection system, cortrak Restrictions Weight Bearing Restrictions: No       Mobility Bed Mobility Overal bed mobility: Needs Assistance Bed Mobility: Rolling, Sidelying to Sit Rolling: Max assist Sidelying to sit: Max assist, +2 for physical assistance   Sit to supine: Max assist, +2 for physical assistance   General bed mobility comments: roll to L max assist, max assist +2 for full upright position; pt able to scoot hips short distances mod ind, required modA+2 for scooting back in recliner    Transfers Overall transfer level: Needs assistance Equipment used: 2 person hand held assist Transfers: Sit to/from Stand, Bed to chair/wheelchair/BSC Sit to Stand: Max assist, +2 physical assistance   Squat pivot transfers: Total assist, +2 physical assistance, +2 safety/equipment       General transfer comment: unable to stand fully upright despite max +2 effort. total A +2 squat pivot from EOB>drop arm chair     Balance Overall balance assessment: Needs assistance Sitting-balance support: Single extremity supported, Bilateral upper extremity supported, No upper extremity supported, Feet supported, Feet unsupported Sitting balance-Leahy Scale: Fair Sitting balance - Comments: minA progressing to min guard during prolonged sitting EOB while brushing teeth; pt able to maintain upright position without demonstrating swaying outside BOS; pt with favor to lean on RUE while static sitting   Standing balance support: Bilateral upper extremity supported, During functional activity Standing balance-Leahy Scale: Zero  ADL either performed or assessed with clinical judgement   ADL Overall ADL's : Needs assistance/impaired     Grooming: Moderate assistance;Sitting;Oral care;Wash/dry face Grooming Details (indicate cue type and reason): mod A to wash face at bed level, mod A to complete oral care with yonker in sitting                 Toilet Transfer: Total assistance;+2 for physical assistance;+2 for safety/equipment;Squat-pivot;BSC/3in1 Toilet Transfer Details (indicate cue type and reason): simulated from bed>chair         Functional mobility during ADLs: Maximal assistance;Total assistance;+2 for physical assistance;+2 for safety/equipment General ADL Comments: excellent progress with LUE involvement, command following, verbalizations. actively participated in grooming tasks    Extremity/Trunk Assessment Upper Extremity Assessment Upper Extremity Assessment: LUE deficits/detail LUE Deficits / Details: moving spontaneously against gravity, not to command. noted to reach towards noted upon arrival. LUE Coordination: decreased fine motor;decreased gross motor   Lower Extremity Assessment Lower Extremity Assessment: Defer to PT evaluation        Vision   Vision Assessment?: Vision impaired- to be further tested in functional context Additional Comments: R gaze, crossed midline x1 for 2-3 seconds. Benefits from blocking. DIfficult to assess inattention vs cut. Plan to try occlusion glassses   Perception     Praxis      Cognition Arousal/Alertness: Awake/alert Behavior During Therapy: Flat affect Overall Cognitive Status: Impaired/Different from baseline Area of Impairment: Attention, Following commands, Safety/judgement, Problem solving                   Current Attention Level: Sustained   Following Commands: Follows one step commands with increased time Safety/Judgement: Decreased awareness of safety   Problem Solving: Slow processing,  Decreased initiation, Requires verbal cues, Requires tactile cues General Comments: 3-5 second daley in processing before following commands of providing one word responses. benefits from low stim/non distracting environment.        Exercises      Shoulder Instructions       General Comments VSS on RA, family present and supportve    Pertinent Vitals/ Pain       Pain Assessment Pain Assessment: Faces Faces Pain Scale: No hurt Pain Intervention(s): Monitored during session  Home Living                                          Prior Functioning/Environment              Frequency  Min 3X/week        Progress Toward Goals  OT Goals(current goals can now be found in the care plan section)  Progress towards OT goals: Progressing toward goals  Acute Rehab OT Goals OT Goal Formulation: With family Time For Goal Achievement: 10/07/22 Potential to Achieve Goals: Good ADL Goals Pt Will Perform Grooming: with min assist;sitting Additional ADL Goal #1: Pt will follow one step commands with 75% accuracy. Additional ADL Goal #2: Pt will perform bed mobility with mod assist in preparation for ADLs. Additional ADL Goal #3: Pt will turn head 45 degrees toward L to locate visual target with multimodal cues 3/5 trials. Additional ADL Goal #4: Pt will demonstrate fair sitting balance as a precursor to ADLs.  Plan Discharge plan remains appropriate    Co-evaluation    PT/OT/SLP Co-Evaluation/Treatment: Yes Reason for Co-Treatment: Complexity of the patient's impairments (multi-system involvement);Necessary to address  cognition/behavior during functional activity;To address functional/ADL transfers PT goals addressed during session: Mobility/safety with mobility;Balance;Strengthening/ROM OT goals addressed during session: ADL's and self-care      AM-PAC OT "6 Clicks" Daily Activity     Outcome Measure   Help from another person eating meals?: Total Help  from another person taking care of personal grooming?: A Lot Help from another person toileting, which includes using toliet, bedpan, or urinal?: A Lot Help from another person bathing (including washing, rinsing, drying)?: A Lot Help from another person to put on and taking off regular upper body clothing?: A Lot Help from another person to put on and taking off regular lower body clothing?: Total 6 Click Score: 10    End of Session Equipment Utilized During Treatment: Gait belt  OT Visit Diagnosis: Unsteadiness on feet (R26.81);Muscle weakness (generalized) (M62.81);Hemiplegia and hemiparesis;Other symptoms and signs involving cognitive function Hemiplegia - Right/Left: Left Hemiplegia - dominant/non-dominant: Non-Dominant Hemiplegia - caused by: Cerebral infarction   Activity Tolerance Patient tolerated treatment well   Patient Left in chair;with call bell/phone within reach;with chair alarm set;with family/visitor present   Nurse Communication Mobility status        Time: 6767-2094 OT Time Calculation (min): 39 min  Charges: OT General Charges $OT Visit: 1 Visit OT Treatments $Self Care/Home Management : 8-22 mins    Donia Pounds 09/26/2022, 12:14 PM

## 2022-09-26 NOTE — Progress Notes (Signed)
Physical Therapy Treatment Patient Details Name: James Estrada MRN: [id number] DOB: [birth year] Today's Date: 09/26/2022   History of Present Illness Pt is a 81 y/o M admitted to [facility] on 09/17/22 with BLE weakness, chest + back pain, admitted with CT angio shows type dissection with thrombosed false lumen. Repair of type 1 aortic dissection performed 11/4. Head CT 11/6 acute infarcts within R MCA involvement, including R cerebellum, basal ganglia, frontal lobe. ETT 11/4-11/9. PMH AAA s/p endovascular repair (2020), HTN, HLD, GERD.    PT Comments    Pt steady progressing with mobility. Today's session focused on sitting balance and transfers. Pt able to prolong sit EOB with no LOB. Three trials sit to stand maxA+2, able to clear buttock but did not achieve full upright position. Squat pivot to chair maxA+2, tactile cues to educate pt on how to support themselves on therapists UE during transfer to maintain sternal precautions. Pt remains limited by generalized weakness, decreased activity tolerance, and impaired balance strategies/postural reactions. Continue to recommend acute PT services to maximize functional mobility and independence prior to d/c to AIR level therapies when medically appropriate.    Recommendations for follow up therapy are one component of a multi-disciplinary discharge planning process, led by the attending physician.  Recommendations may be updated based on patient status, additional functional criteria and insurance authorization.  Follow Up Recommendations  Acute inpatient rehab (3hours/day)     Assistance Recommended at Discharge    Patient can return home with the following Two people to help with walking and/or transfers;Two people to help with bathing/dressing/bathroom;Assistance with cooking/housework;Assistance with feeding;Assist for transportation;Help with stairs or ramp for entrance   Equipment Recommendations  Other (comment) (TBD)    Recommendations for  Other Services Rehab consult     Precautions / Restrictions Precautions Precautions: Fall;Other (comment);Sternal Precaution Booklet Issued: No Precaution Comments: Fecal collection system, cortrak Restrictions Weight Bearing Restrictions: No     Mobility  Bed Mobility Overal bed mobility: Needs Assistance Bed Mobility: Rolling, Sidelying to Sit Rolling: Max assist Sidelying to sit: Max assist, +2 for physical assistance       General bed mobility comments: roll to L max assist, max assist +2 for full upright position; pt able to scoot hips short distances mod ind, required modA+2 for scooting back in recliner    Transfers Overall transfer level: Needs assistance Equipment used: 2 person hand held assist Transfers: Sit to/from Stand, Bed to chair/wheelchair/BSC Sit to Stand: Max assist, +2 physical assistance     Squat pivot transfers: Max assist, +2 physical assistance     General transfer comment: three trials sit to stand max+2 clearing buttock but unable to achieve full upright position, improved engagament of LLE and LUE during session today although deficits still present; squat pivot from bed > chair with three stops along the way    Ambulation/Gait                   Stairs             Wheelchair Mobility    Modified Rankin (Stroke Patients Only) Modified Rankin (Stroke Patients Only) Pre-Morbid Rankin Score: No symptoms Modified Rankin: Severe disability     Balance Overall balance assessment: Needs assistance Sitting-balance support: Single extremity supported, Bilateral upper extremity supported, No upper extremity supported, Feet supported, Feet unsupported Sitting balance-Leahy Scale: Fair Sitting balance - Comments: minA progressing to min guard during prolonged sitting EOB while brushing teeth; pt able to maintain upright position without demonstrating swaying  outside BOS; pt with favor to lean on RUE while static sitting Postural  control: Right lateral lean   Standing balance-Leahy Scale: Zero Standing balance comment: pt unable to maintain standing position without maxA+2                            Cognition Arousal/Alertness: Awake/alert Behavior During Therapy: Flat affect Overall Cognitive Status: Impaired/Different from baseline Area of Impairment: Attention, Following commands, Safety/judgement, Problem solving                   Current Attention Level: Sustained   Following Commands: Follows one step commands with increased time Safety/Judgement: Decreased awareness of safety   Problem Solving: Slow processing, Decreased initiation, Requires verbal cues, Requires tactile cues General Comments: able to keep eyes open once engaging in mobility and sitting EOB, improved ability to cross midline to L side temporarily, 3-5 second daley in processing before following commands of providing one word responses. oriented to self.        Exercises      General Comments General comments (skin integrity, edema, etc.): VSS on RA; daughter and wife present during session      Pertinent Vitals/Pain Pain Assessment Faces Pain Scale: No hurt    Home Living                          Prior Function            PT Goals (current goals can now be found in the care plan section) Acute Rehab PT Goals Patient Stated Goal: none stated due to expressive difficulties PT Goal Formulation: With patient Time For Goal Achievement: 10/07/22 Potential to Achieve Goals: Fair Progress towards PT goals: Progressing toward goals    Frequency    Min 4X/week      PT Plan Current plan remains appropriate    Co-evaluation PT/OT/SLP Co-Evaluation/Treatment: Yes Reason for Co-Treatment: Complexity of the patient's impairments (multi-system involvement);To address functional/ADL transfers;For patient/therapist safety PT goals addressed during session: Mobility/safety with  mobility;Balance;Strengthening/ROM        AM-PAC PT "6 Clicks" Mobility   Outcome Measure  Help needed turning from your back to your side while in a flat bed without using bedrails?: A Lot Help needed moving from lying on your back to sitting on the side of a flat bed without using bedrails?: Total Help needed moving to and from a bed to a chair (including a wheelchair)?: Total Help needed standing up from a chair using your arms (e.g., wheelchair or bedside chair)?: Total Help needed to walk in hospital room?: Total Help needed climbing 3-5 steps with a railing? : Total 6 Click Score: 7    End of Session Equipment Utilized During Treatment: Gait belt Activity Tolerance: Patient limited by fatigue;Patient tolerated treatment well Patient left: with call bell/phone within reach;with family/visitor present;in chair;with chair alarm set Nurse Communication: Mobility status;Other (comment) (Chair pad under pt) PT Visit Diagnosis: Other abnormalities of gait and mobility (R26.89);Muscle weakness (generalized) (M62.81);Other symptoms and signs involving the nervous system (R29.898)     Time: [id number] PT Time Calculation (min) (ACUTE ONLY): 39 min  Charges:  $Therapeutic Activity: 23-37 mins                     Chipper Oman, SPT    Glenmoore Macauley Mossberg 09/26/2022, 10:08 AM

## 2022-09-27 ENCOUNTER — Inpatient Hospital Stay (HOSPITAL_COMMUNITY): Payer: Medicare Other

## 2022-09-27 DIAGNOSIS — I4891 Unspecified atrial fibrillation: Secondary | ICD-10-CM | POA: Diagnosis not present

## 2022-09-27 DIAGNOSIS — I6340 Cerebral infarction due to embolism of unspecified cerebral artery: Secondary | ICD-10-CM | POA: Diagnosis not present

## 2022-09-27 DIAGNOSIS — R58 Hemorrhage, not elsewhere classified: Secondary | ICD-10-CM | POA: Diagnosis not present

## 2022-09-27 DIAGNOSIS — I7103 Dissection of thoracoabdominal aorta: Secondary | ICD-10-CM | POA: Diagnosis not present

## 2022-09-27 DIAGNOSIS — I3139 Other pericardial effusion (noninflammatory): Secondary | ICD-10-CM

## 2022-09-27 LAB — GLUCOSE, CAPILLARY
Glucose-Capillary: 107 mg/dL — ABNORMAL HIGH (ref 70–99)
Glucose-Capillary: 140 mg/dL — ABNORMAL HIGH (ref 70–99)
Glucose-Capillary: 152 mg/dL — ABNORMAL HIGH (ref 70–99)
Glucose-Capillary: 158 mg/dL — ABNORMAL HIGH (ref 70–99)
Glucose-Capillary: 165 mg/dL — ABNORMAL HIGH (ref 70–99)
Glucose-Capillary: 165 mg/dL — ABNORMAL HIGH (ref 70–99)
Glucose-Capillary: 167 mg/dL — ABNORMAL HIGH (ref 70–99)

## 2022-09-27 LAB — CBC
HCT: 30.5 % — ABNORMAL LOW (ref 39.0–52.0)
Hemoglobin: 9 g/dL — ABNORMAL LOW (ref 13.0–17.0)
MCH: 27.9 pg (ref 26.0–34.0)
MCHC: 29.5 g/dL — ABNORMAL LOW (ref 30.0–36.0)
MCV: 94.4 fL (ref 80.0–100.0)
Platelets: 249 10*3/uL (ref 150–400)
RBC: 3.23 MIL/uL — ABNORMAL LOW (ref 4.22–5.81)
RDW: 14.9 % (ref 11.5–15.5)
WBC: 19.2 10*3/uL — ABNORMAL HIGH (ref 4.0–10.5)
nRBC: 0.1 % (ref 0.0–0.2)

## 2022-09-27 LAB — BASIC METABOLIC PANEL
Anion gap: 7 (ref 5–15)
BUN: 41 mg/dL — ABNORMAL HIGH (ref 8–23)
CO2: 28 mmol/L (ref 22–32)
Calcium: 8.7 mg/dL — ABNORMAL LOW (ref 8.9–10.3)
Chloride: 116 mmol/L — ABNORMAL HIGH (ref 98–111)
Creatinine, Ser: 1.07 mg/dL (ref 0.61–1.24)
GFR, Estimated: >60 mL/min (ref >60)
Glucose, Bld: 157 mg/dL — ABNORMAL HIGH (ref 70–99)
Potassium: 3.8 mmol/L (ref 3.5–5.1)
Sodium: 151 mmol/L — ABNORMAL HIGH (ref 135–145)

## 2022-09-27 LAB — ECHOCARDIOGRAM LIMITED
AV Mean grad: 4.7 mmHg
AV Peak grad: 8.8 mmHg
Ao pk vel: 1.48 m/s
Height: 72 in
S' Lateral: 3.3 cm
Weight: 2885.38 oz

## 2022-09-27 MED ORDER — ACETAMINOPHEN 160 MG/5ML PO SOLN
ORAL | Status: AC
  Filled 2022-09-27: qty 20.3

## 2022-09-27 MED ORDER — AMIODARONE HCL 200 MG PO TABS
200.0000 mg | ORAL_TABLET | Freq: Every day | ORAL | Status: DC
  Administered 2022-09-27: 200 mg
  Filled 2022-09-27: qty 1

## 2022-09-27 MED ORDER — LOSARTAN POTASSIUM 25 MG PO TABS: 25.0000 mg | ORAL_TABLET | Freq: Every day | ORAL | Status: DC

## 2022-09-27 MED ORDER — AMIODARONE LOAD VIA INFUSION
150.0000 mg | Freq: Once | INTRAVENOUS | Status: AC
  Administered 2022-09-27: 150 mg via INTRAVENOUS
  Filled 2022-09-27: qty 83.34

## 2022-09-27 MED ORDER — POTASSIUM CHLORIDE 20 MEQ PO PACK
40.0000 meq | PACK | Freq: Once | ORAL | Status: AC
  Administered 2022-09-27: 40 meq via ORAL
  Filled 2022-09-27: qty 2

## 2022-09-27 MED ORDER — AMIODARONE HCL IN DEXTROSE 360-4.14 MG/200ML-% IV SOLN
60.0000 mg/h | INTRAVENOUS | Status: AC
  Administered 2022-09-27 (×2): 60 mg/h via INTRAVENOUS
  Filled 2022-09-27 (×2): qty 200

## 2022-09-27 MED ORDER — AMIODARONE HCL IN DEXTROSE 360-4.14 MG/200ML-% IV SOLN
30.0000 mg/h | INTRAVENOUS | Status: AC
  Administered 2022-09-27 – 2022-09-28 (×3): 30 mg/h via INTRAVENOUS
  Filled 2022-09-27 (×3): qty 200

## 2022-09-27 MED ORDER — LOSARTAN POTASSIUM 50 MG PO TABS
50.0000 mg | ORAL_TABLET | Freq: Every day | ORAL | Status: DC
  Administered 2022-09-27 – 2022-09-30 (×4): 50 mg via ORAL
  Filled 2022-09-27 (×4): qty 1

## 2022-09-27 NOTE — Progress Notes (Signed)
10 Days Post-Op Procedure(s) (LRB): REPAIR OF ACUTE ASCENDING THORACIC AORTIC DISSECTION USING 28 MM HEMASHIELD PLATINUM VASCULAR GRAFT (N/A) Subjective: Alert, denies pain, answers questions intermittently  Objective: Vital signs in last 24 hours: Temp:  [97.9 F (36.6 C)-98.9 F (37.2 C)] 98.7 F (37.1 C) (11/13 2300) Pulse Rate:  [60-129] 63 (11/14 0700) Cardiac Rhythm: Sinus bradycardia;Normal sinus rhythm (11/14 0400) Resp:  [17-28] 25 (11/14 0700) BP: (130-169)/(53-125) 155/74 (11/14 0700) SpO2:  [81 %-98 %] 94 % (11/14 0700) Weight:  [81.8 kg] 81.8 kg (11/14 0500)  Hemodynamic parameters for last 24 hours:    Intake/Output from previous day: 11/13 0701 - 11/14 0700 In: 1543 [I.V.:23; NG/GT:1520] Out: 1275 [Urine:1275] Intake/Output this shift: No intake/output data recorded.  General appearance: alert, cooperative, and no distress Neurologic: left sided weakness Heart: regular rate and rhythm Lungs: diminished breath sounds bibasilar Abdomen: normal findings: soft, non-tender Wound: clean and dry  Lab Results: Recent Labs    09/27/22 0530  WBC 19.2*  HGB 9.0*  HCT 30.5*  PLT 249   BMET:  Recent Labs    09/26/22 1047 09/27/22 0530  NA 153* 151*  K 4.1 3.8  CL 118* 116*  CO2 28 28  GLUCOSE 165* 157*  BUN 39* 41*  CREATININE 1.14 1.07  CALCIUM 9.1 8.7*    PT/INR: No results for input(s): "LABPROT", "INR" in the last 72 hours. ABG    Component Value Date/Time   PHART 7.479 (H) 09/20/2022 0830   HCO3 25.2 09/20/2022 0830   TCO2 26 09/20/2022 0830   ACIDBASEDEF 1.0 09/18/2022 1629   O2SAT 94 09/20/2022 0830   CBG (last 3)  Recent Labs    09/26/22 1919 09/26/22 2347 09/27/22 0302  GLUCAP 169* 152* 167*    Assessment/Plan: S/P Procedure(s) (LRB): REPAIR OF ACUTE ASCENDING THORACIC AORTIC DISSECTION USING 28 MM HEMASHIELD PLATINUM VASCULAR GRAFT (N/A) - NEURO- R MCA CVA- moving left arm more than yesterday, when asked to grip he extends  arm, but doesn't actually grip.  Left leg still weakest but is moving more spontaneously.  Some expressive aphasia Continue PT/OT/ Speech  CV- in SR,  hypertension- metoprolol + Cozaar  RESP- CXR shows some improvement in right effusion and LLL atelectasis from prior film  RENAL- hypernatremia- on free water  ENDO- CBG moderately elevated- continue SSI  GI- tolerating TF.  Leukocytosis- WBC up to 19K- no apparent source. Monitor, culture if spikes.   LOS: 10 days    James Estrada 09/27/2022

## 2022-09-27 NOTE — Evaluation (Signed)
Speech Language Pathology Evaluation Patient Details Name: James Estrada MRN: [id number] DOB: 06-15-1941 Today's Date: 09/27/2022 Time: 1000-1029 SLP Time Calculation (min) (ACUTE ONLY): 29 min  Problem List:  Patient Active Problem List   Diagnosis Date Noted   S/P aortic dissection repair 09/18/2022   Status post surgery 09/17/2022   Aneurysm of left internal iliac artery ([place]) 05/22/2022   Colon cancer screening 05/22/2022   Essential hypertension 05/22/2022   Hemorrhoids 05/22/2022   Inguinal hernia 05/22/2022   Irritable bowel syndrome 05/22/2022   Mixed hyperlipidemia 05/22/2022   Pure hypercholesterolemia 05/22/2022   Thrombocytopenia ([place]) 05/22/2022   Allergic rhinitis 05/22/2022   AAA (abdominal aortic aneurysm) without rupture ([place]) 05/04/2020   Trochanteric bursitis, right hip 08/20/2018   History of right hip replacement 08/20/2018   Status post total replacement of right hip 09/08/2017   Pain of right hip joint 08/07/2017   Unilateral primary osteoarthritis, right hip 08/07/2017   BPH (benign prostatic hypertrophy) with urinary obstruction 05/15/2013   Past Medical History:  Past Medical History:  Diagnosis Date   AAA (abdominal aortic aneurysm) ([place])    last u/s done 07/18/17    Arthritis    Bradycardia    Dysrhythmia    frequent PAC, for 20 years   GERD (gastroesophageal reflux disease)    occ, OTC   Hemorrhoids    History of hiatal hernia    Hyperlipidemia    Hypertension    Seasonal allergies    Past Surgical History:  Past Surgical History:  Procedure Laterality Date   ABDOMINAL AORTIC ENDOVASCULAR STENT GRAFT N/A 05/04/2020   Procedure: ABDOMINAL AORTIC ENDOVASCULAR STENT GRAFT;  Surgeon: [name], MD;  Location: [place];  Service: Vascular;  Laterality: N/A;   CATARACT EXTRACTION Bilateral 2009   DIAGNOSTIC LAPAROSCOPY     laparoscopic hernia repair   EYE SURGERY Bilateral    cataract removal   HERNIA REPAIR Bilateral 1999, 2006    JOINT REPLACEMENT Right ~2018   hip replacement   pheochromocytoma  1993   PROSTATECTOMY N/A 05/15/2013   Procedure: PROSTATECTOMY RETROPUBIC; SIMPLE OPEN PROSTATECTOMY;  Surgeon: [name], MD;  Location: [facility];  Service: Urology;  Laterality: N/A;   REPAIR OF ACUTE ASCENDING THORACIC AORTIC DISSECTION N/A 09/17/2022   Procedure: REPAIR OF ACUTE ASCENDING THORACIC AORTIC DISSECTION USING 28 MM HEMASHIELD PLATINUM VASCULAR GRAFT;  Surgeon: [name], MD;  Location: [place];  Service: Vascular;  Laterality: N/A;  Median sternotomy   TOTAL ELBOW REPLACEMENT Left    > 30 years ago   TOTAL HIP ARTHROPLASTY Right 09/08/2017   Procedure: RIGHT TOTAL HIP ARTHROPLASTY ANTERIOR APPROACH;  Surgeon: [name], MD;  Location: [facility];  Service: Orthopedics;  Laterality: Right;   ULTRASOUND GUIDANCE FOR VASCULAR ACCESS Bilateral 05/04/2020   Procedure: ULTRASOUND GUIDANCE FOR VASCULAR ACCESS;  Surgeon: [name], MD;  [facility];  Service: Vascular;  Laterality: Bilateral;   HPI:  Pt is a 81 y/o M admitted to [facility] on 09/17/22 with type 1 aortic dissection requiring emergent repair. Head CT 11/6 acute infarcts within R MCA involvement, including R cerebellum, basal ganglia, frontal lobe. ETT 11/4-11/9. PMH AAA s/p endovascular repair (2020), HTN, HLD, GERD.   Assessment / Plan / Recommendation Clinical Impression  Mr Noor presents with primary deficits in initiation and attention at this time.  Demonstrates significant delays in response time (motor and verbal), with impaired sustained attention. He is oriented to person.  He had difficulty following one-step commands,  with either severe delay or no response.  He was able to repeat single words, recite the DOW, and name a few common objects in the room. There were no errored responses, only an absence of responses. He answered some questions related to comfort, but did not answer biographical questions about self/family.  He  demonstrated left neglect. He will benefit from SLP tx for cognition/language both while on acute care and hopefully on AIR - D/W Mrs. Trombly.  Will follow.    SLP Assessment  SLP Recommendation/Assessment: Patient needs continued Speech Lanaguage Pathology Services SLP Visit Diagnosis: Cognitive communication deficit (R41.841)    Recommendations for follow up therapy are one component of a multi-disciplinary discharge planning process, led by the attending physician.  Recommendations may be updated based on patient status, additional functional criteria and insurance authorization.    Follow Up Recommendations  Acute inpatient rehab (3hours/day)    Assistance Recommended at Discharge  Frequent or constant Supervision/Assistance  Functional Status Assessment Patient has had a recent decline in their functional status and demonstrates the ability to make significant improvements in function in a reasonable and predictable amount of time.  Frequency and Duration min 2x/week  2 weeks      SLP Evaluation Cognition  Overall Cognitive Status: Impaired/Different from baseline Arousal/Alertness: Awake/alert Orientation Level: Oriented to person Attention: Focused;Sustained Focused Attention: Appears intact Sustained Attention: Impaired Sustained Attention Impairment: Verbal basic;Functional basic Memory: Impaired Memory Impairment: Retrieval deficit Awareness: Impaired Awareness Impairment: Intellectual impairment       Comprehension  Auditory Comprehension Overall Auditory Comprehension: Impaired Commands: Impaired One Step Basic Commands: 50-74% accurate Conversation: Simple Visual Recognition/Discrimination Discrimination: Not tested Reading Comprehension Reading Status:  (did not read aloud despite cues)    Expression Expression Primary Mode of Expression: Verbal Verbal Expression Overall Verbal Expression: Impaired Initiation: Impaired Automatic Speech: Day of  week Level of Generative/Spontaneous Verbalization: Sentence Repetition: No impairment Naming: Impairment Responsive: Not tested Confrontation: Within functional limits Pragmatics: Impairment Impairments: Monotone Interfering Components: Attention Written Expression Dominant Hand: Left   Oral / Motor  Oral Motor/Sensory Function Overall Oral Motor/Sensory Function: Within functional limits Motor Speech Overall Motor Speech: Appears within functional limits for tasks assessed            Blenda Mounts Laurice 09/27/2022, 10:53 AM Marchelle Folks L. Samson Frederic, MA CCC/SLP Clinical Specialist - Acute Care SLP Acute Rehabilitation Services Office number [phone]

## 2022-09-27 NOTE — Progress Notes (Signed)
Physical Therapy Treatment Patient Details Name: James Estrada MRN: [id number] DOB: 1941/03/08 Today's Date: 09/27/2022   History of Present Illness Pt is a 81 y/o M admitted to [facility] on 09/17/22 with BLE weakness, chest + back pain, admitted with CT angio shows type dissection with thrombosed false lumen. Repair of type 1 aortic dissection performed 11/4. Head CT 11/6 acute infarcts within R MCA involvement, including R cerebellum, basal ganglia, frontal lobe. ETT 11/4-11/9. PMH AAA s/p endovascular repair (2020), HTN, HLD, GERD.    PT Comments    Pt progressing with mobility, second session today with focus on sit to stand transfer and standing balance during dependent pericare. Four sit to stands, pt demonstrating ability to follow commands with improved AROM of LUE during session. Fluctuating min-maxA+2 for transfer and standing balance, +3 for pericare. Pt remains limited by generalized weakness, decreased activity tolerance, and impaired balance strategies/postural reactions. Continue to recommend acute PT services to maximize functional mobility and independence prior to d/c to AIR level therapies.    Recommendations for follow up therapy are one component of a multi-disciplinary discharge planning process, led by the attending physician.  Recommendations may be updated based on patient status, additional functional criteria and insurance authorization.  Follow Up Recommendations  Acute inpatient rehab (3hours/day)     Assistance Recommended at Discharge Frequent or constant Supervision/Assistance  Patient can return home with the following Two people to help with walking and/or transfers;Two people to help with bathing/dressing/bathroom;Assistance with cooking/housework;Assistance with feeding;Assist for transportation;Help with stairs or ramp for entrance   Equipment Recommendations  Other (comment) (TBD)    Recommendations for Other Services       Precautions / Restrictions  Precautions Precautions: Fall;Other (comment);Sternal Precaution Booklet Issued: No Precaution Comments: Cortrak, fecal incontinence Restrictions Weight Bearing Restrictions: No     Mobility  Bed Mobility Overal bed mobility: Needs Assistance Bed Mobility: Supine to Sit         General bed mobility comments: pt sitting up in recliner upon arrival    Transfers Overall transfer level: Needs assistance Equipment used: 2 person hand held assist Transfers: Sit to/from Stand Sit to Stand: Mod assist, Max assist, +2 physical assistance, +2 safety/equipment, Min assist         General transfer comment: four trial sit to stand during dependent pericare with improved ability to engage LUE as session progressed; fluctuated from min-max+2, blocking of L knee required during static standing, +3 for pericare help    Ambulation/Gait                   Stairs             Wheelchair Mobility    Modified Rankin (Stroke Patients Only) Modified Rankin (Stroke Patients Only) Pre-Morbid Rankin Score: No symptoms Modified Rankin: Severe disability     Balance Overall balance assessment: Needs assistance Sitting-balance support: Single extremity supported, Bilateral upper extremity supported, Feet supported, Feet unsupported Sitting balance-Leahy Scale: Fair Sitting balance - Comments: min guard sitting up in recliner with inconsitent abiltiy to follow multi step commands for weight shifting associated with pericare Postural control: Right lateral lean Standing balance support: Bilateral upper extremity supported, During functional activity, Single extremity supported Standing balance-Leahy Scale: Poor Standing balance comment: 2 HHA most of the time with intermittent removal of 1 UE to find chair arm or therapist arm to support self when suspected nervousness about falls  Cognition Arousal/Alertness: Awake/alert Behavior During  Therapy: Flat affect Overall Cognitive Status: Impaired/Different from baseline Area of Impairment: Attention, Following commands, Safety/judgement, Problem solving                   Current Attention Level: Sustained   Following Commands: Follows one step commands with increased time Safety/Judgement: Decreased awareness of safety   Problem Solving: Slow processing, Decreased initiation, Requires verbal cues, Requires tactile cues General Comments: Improved appropriate initiatation of L UE during sit to stand while performing total pericare; pt with correct 1-2 word answers with 3-5 second delay, still demonstrating L sided inattention        Exercises      General Comments General comments (skin integrity, edema, etc.): VSS on RA      Pertinent Vitals/Pain Pain Assessment Faces Pain Scale: Hurts a little bit Pain Location: Site of primofit removal Pain Descriptors / Indicators: Discomfort Pain Intervention(s): Monitored during session    Home Living     Available Help at Discharge: Family;Available 24 hours/day Type of Home: House                  Prior Function            PT Goals (current goals can now be found in the care plan section) Acute Rehab PT Goals Patient Stated Goal: none stated due to expressive difficulties PT Goal Formulation: With patient Time For Goal Achievement: 10/07/22 Potential to Achieve Goals: Fair Progress towards PT goals: Progressing toward goals    Frequency    Min 4X/week      PT Plan Current plan remains appropriate    Co-evaluation              AM-PAC PT "6 Clicks" Mobility   Outcome Measure  Help needed turning from your back to your side while in a flat bed without using bedrails?: A Little Help needed moving from lying on your back to sitting on the side of a flat bed without using bedrails?: A Little Help needed moving to and from a bed to a chair (including a wheelchair)?: Total Help needed  standing up from a chair using your arms (e.g., wheelchair or bedside chair)?: Total Help needed to walk in hospital room?: Total Help needed climbing 3-5 steps with a railing? : Total 6 Click Score: 10    End of Session Equipment Utilized During Treatment: Gait belt Activity Tolerance: Patient limited by fatigue;Patient tolerated treatment well Patient left: with call bell/phone within reach;with family/visitor present;in chair;with chair alarm set Nurse Communication: Mobility status;Other (comment) PT Visit Diagnosis: Other abnormalities of gait and mobility (R26.89);Muscle weakness (generalized) (M62.81);Other symptoms and signs involving the nervous system (R29.898)     Time: 5427-0623 PT Time Calculation (min) (ACUTE ONLY): 18 min  Charges:  $Therapeutic Activity: 8-22 mins                    Alric Ran, SPT    James Estrada 09/27/2022, 12:38 PM

## 2022-09-27 NOTE — Progress Notes (Signed)
Physical Therapy Treatment Patient Details Name: James Estrada MRN: [id number] DOB: 1941-04-26 Today's Date: 09/27/2022   History of Present Illness Pt is a 81 y/o M admitted to [facility] on 09/17/22 with BLE weakness, chest + back pain, admitted with CT angio shows type dissection with thrombosed false lumen. Repair of type 1 aortic dissection performed 11/4. Head CT 11/6 acute infarcts within R MCA involvement, including R cerebellum, basal ganglia, frontal lobe. ETT 11/4-11/9. PMH AAA s/p endovascular repair (2020), HTN, HLD, GERD.    PT Comments    Pt steady progressing with mobility. Today's session focused on bed mobility and transfers. Improved ability for sit to stand tranfers modA+2, requiring up to maxA+2 on subsequent trials. When cued, pt visually able to cross midline to L, unable to maintain for more than a few seconds. Continues to demonstrate 3-5 second delay before verbal answers. Pt remains limited by L inattention generalized weakness, decreased activity tolerance, and impaired balance strategies/postural reactions. Continue to recommend acute PT services to maximize functional mobility and independence prior to d/c to AIR level therapies.    Recommendations for follow up therapy are one component of a multi-disciplinary discharge planning process, led by the attending physician.  Recommendations may be updated based on patient status, additional functional criteria and insurance authorization.  Follow Up Recommendations  Acute inpatient rehab (3hours/day)     Assistance Recommended at Discharge Frequent or constant Supervision/Assistance  Patient can return home with the following Two people to help with walking and/or transfers;Two people to help with bathing/dressing/bathroom;Assistance with cooking/housework;Assistance with feeding;Assist for transportation;Help with stairs or ramp for entrance   Equipment Recommendations  Other (comment) (TBD)    Recommendations for  Other Services       Precautions / Restrictions Precautions Precautions: Fall;Other (comment);Sternal Precaution Booklet Issued: No Precaution Comments: Cortrak, fecal incontinence Restrictions Weight Bearing Restrictions: No     Mobility  Bed Mobility Overal bed mobility: Needs Assistance Bed Mobility: Supine to Sit     Supine to sit: Mod assist, Min assist     General bed mobility comments: minA for pt supine to long sit position, modA necessary to get legs and hips scooting EOB; pt initiates AROM but unable to obtain large gains of movement while maintaining sternal precautions    Transfers   Equipment used: 2 person hand held assist Transfers: Sit to/from Stand, Bed to chair/wheelchair/BSC Sit to Stand: Mod assist, Max assist, +2 physical assistance, +2 safety/equipment     Squat pivot transfers: +2 physical assistance, +2 safety/equipment, Max assist     General transfer comment: initial sit to stand modA+2 where pt obtained full upright position for ~ 25 sec before needing to take a seated rest break; second sit to stand maxA+2 with poor standing endurance during total pericare; squat pivot maxA+2 with pt attempting to help by stepping with R LE    Ambulation/Gait                   Stairs             Wheelchair Mobility    Modified Rankin (Stroke Patients Only) Modified Rankin (Stroke Patients Only) Pre-Morbid Rankin Score: No symptoms Modified Rankin: Severe disability     Balance Overall balance assessment: Needs assistance Sitting-balance support: Single extremity supported, Bilateral upper extremity supported, No upper extremity supported, Feet supported, Feet unsupported Sitting balance-Leahy Scale: Fair Sitting balance - Comments: min guard during entirety of sitting EOB, heavy favor of leaving on RUE; demonstrating righting reactions while  weight shifting during dynamic sitting balance Postural control: Right lateral lean Standing  balance support: Bilateral upper extremity supported, During functional activity Standing balance-Leahy Scale: Zero Standing balance comment: pt unable to maintain standing position without maxA+2                            Cognition Arousal/Alertness: Awake/alert Behavior During Therapy: Flat affect Overall Cognitive Status: Impaired/Different from baseline Area of Impairment: Attention, Following commands, Safety/judgement, Problem solving                   Current Attention Level: Sustained   Following Commands: Follows one step commands with increased time Safety/Judgement: Decreased awareness of safety   Problem Solving: Slow processing, Decreased initiation, Requires verbal cues, Requires tactile cues General Comments: Oriented to self with increased cuing, able to repeat back wifes name. Word finding difficulties for daughter-in-law, but recognized name with increased facial expression when verbalized to him. Continue to allow for a 3-5 second delay before pt replies with 1-2 word answers. improved ability to cross midline to L side but does not maintain for greater than a few seconds before heavy favor of R side again        Exercises      General Comments General comments (skin integrity, edema, etc.): VSS on RA; spouse, daughter, and daughter in law present      Pertinent Vitals/Pain Pain Assessment Faces Pain Scale: No hurt    Home Living                          Prior Function            PT Goals (current goals can now be found in the care plan section) Acute Rehab PT Goals Patient Stated Goal: none stated due to expressive difficulties PT Goal Formulation: With patient Time For Goal Achievement: 10/07/22 Potential to Achieve Goals: Fair Progress towards PT goals: Progressing toward goals    Frequency    Min 4X/week      PT Plan Current plan remains appropriate    Co-evaluation              AM-PAC PT "6 Clicks"  Mobility   Outcome Measure  Help needed turning from your back to your side while in a flat bed without using bedrails?: A Lot Help needed moving from lying on your back to sitting on the side of a flat bed without using bedrails?: A Lot Help needed moving to and from a bed to a chair (including a wheelchair)?: Total Help needed standing up from a chair using your arms (e.g., wheelchair or bedside chair)?: Total Help needed to walk in hospital room?: Total Help needed climbing 3-5 steps with a railing? : Total 6 Click Score: 8    End of Session Equipment Utilized During Treatment: Gait belt Activity Tolerance: Patient limited by fatigue;Patient tolerated treatment well Patient left: with call bell/phone within reach;with family/visitor present;in chair Nurse Communication: Mobility status;Other (comment) (Assist with fecal incontinence) PT Visit Diagnosis: Other abnormalities of gait and mobility (R26.89);Muscle weakness (generalized) (M62.81);Other symptoms and signs involving the nervous system (R29.898)     Time: [id number] PT Time Calculation (min) (ACUTE ONLY): 29 min  Charges:  $Therapeutic Activity: 23-37 mins                    Chipper Oman, SPT    [facility] 09/27/2022, 8:55 AM

## 2022-09-27 NOTE — Progress Notes (Signed)
Pt went into a fib RVR. Discussed with Dr. Shirlee Latch will stop PO amio and bolus and start amio gtt.   Alen Bleacher, AGACNP-BC  Advanced Heart Failure Team

## 2022-09-27 NOTE — Evaluation (Signed)
Clinical/Bedside Swallow Evaluation Patient Details  Name: James Estrada MRN: [id number] Date of Birth: 04-May-1941  Today's Date: 09/27/2022 Time: SLP Start Time (ACUTE ONLY): 0945 SLP Stop Time (ACUTE ONLY): 1000 SLP Time Calculation (min) (ACUTE ONLY): 15 min  Past Medical History:  Past Medical History:  Diagnosis Date   AAA (abdominal aortic aneurysm) ([place])    last u/s done 07/18/17    Arthritis    Bradycardia    Dysrhythmia    frequent PAC, for 20 years   GERD (gastroesophageal reflux disease)    occ, OTC   Hemorrhoids    History of hiatal hernia    Hyperlipidemia    Hypertension    Seasonal allergies    Past Surgical History:  Past Surgical History:  Procedure Laterality Date   ABDOMINAL AORTIC ENDOVASCULAR STENT GRAFT N/A 05/04/2020   Procedure: ABDOMINAL AORTIC ENDOVASCULAR STENT GRAFT;  Surgeon: James Posner, MD;  Location: [facility];  Service: Vascular;  Laterality: N/A;   CATARACT EXTRACTION Bilateral 2009   DIAGNOSTIC LAPAROSCOPY     laparoscopic hernia repair   EYE SURGERY Bilateral    cataract removal   HERNIA REPAIR Bilateral 1999, 2006   JOINT REPLACEMENT Right ~2018   hip replacement   pheochromocytoma  1993   PROSTATECTOMY N/A 05/15/2013   Procedure: PROSTATECTOMY RETROPUBIC; SIMPLE OPEN PROSTATECTOMY;  Surgeon: James Amass, MD;  Location: [facility];  Service: Urology;  Laterality: N/A;   REPAIR OF ACUTE ASCENDING THORACIC AORTIC DISSECTION N/A 09/17/2022   Procedure: REPAIR OF ACUTE ASCENDING THORACIC AORTIC DISSECTION USING 28 MM HEMASHIELD PLATINUM VASCULAR GRAFT;  Surgeon: James Nakayama, MD;  Location: [facility];  Service: Vascular;  Laterality: N/A;  Median sternotomy   TOTAL ELBOW REPLACEMENT Left    > 30 years ago   TOTAL HIP ARTHROPLASTY Right 09/08/2017   Procedure: RIGHT TOTAL HIP ARTHROPLASTY ANTERIOR APPROACH;  Surgeon: James Rossetti, MD;  Location: [facility];  Service: Orthopedics;  Laterality: Right;   ULTRASOUND GUIDANCE FOR  VASCULAR ACCESS Bilateral 05/04/2020   Procedure: ULTRASOUND GUIDANCE FOR VASCULAR ACCESS;  Surgeon: James Posner, MD;  Location: [facility] OR;  Service: Vascular;  Laterality: Bilateral;   HPI:  Pt is a 81 y/o M admitted to [facility] on 09/17/22 with type 1 aortic dissection requiring emergent repair. Head CT 11/6 acute infarcts within R MCA involvement, including R cerebellum, basal ganglia, frontal lobe. ETT 11/4-11/9. PMH AAA s/p endovascular repair (2020), HTN, HLD, GERD.    Assessment / Plan / Recommendation  Clinical Impression  James Estrada participated in clinical swallowing assessment, demonstrating potential for dysphagia due to CVA and prolonged intubation.  Response time for command-following was delayed, and on occasion he needed visual model to execute.  Oral mechanism exam was WNL. Pt accepted sips of water, ice chips, and bites of applesauce with good oral attention, thorough oral manipulation with no oral residue post-swallow. There was consistent throat-clearing after sips of water, no overt coughing.  Recommend allowing sips of water and ice chips today; proceed with instrumental swallow (likely next day) to evaluate physiology of swallow.  D/W James Estrada and RN.  SLP will follow. SLP Visit Diagnosis: Dysphagia, oropharyngeal phase (R13.12)    Aspiration Risk    tba   Diet Recommendation   NPO - may have sips/chips  Medication Administration: Via alternative means    Other  Recommendations Oral Care Recommendations: Oral care QID    Recommendations for follow up therapy are one component of a multi-disciplinary discharge planning process,  led by the attending physician.  Recommendations may be updated based on patient status, additional functional criteria and insurance authorization.  Follow up Recommendations Acute inpatient rehab (3hours/day)        Swallow Study   General Date of Onset: 09/17/22 HPI: Pt is a 81 y/o M admitted to [facility] on 09/17/22 with type 1 aortic dissection  requiring emergent repair. Head CT 11/6 acute infarcts within R MCA involvement, including R cerebellum, basal ganglia, frontal lobe. ETT 11/4-11/9. PMH AAA s/p endovascular repair (2020), HTN, HLD, GERD. Type of Study: Bedside Swallow Evaluation Previous Swallow Assessment: no Diet Prior to this Study: NPO;NG Tube Temperature Spikes Noted: No Respiratory Status: Room air History of Recent Intubation: Yes Length of Intubations (days): 5 days Date extubated: 09/22/22 Behavior/Cognition: Alert Oral Cavity Assessment: Within Functional Limits Oral Cavity - Dentition: Adequate natural dentition Self-Feeding Abilities: Needs assist Patient Positioning: Upright in chair Baseline Vocal Quality: Normal Volitional Cough: Strong Volitional Swallow: Able to elicit    Oral/Motor/Sensory Function Overall Oral Motor/Sensory Function: Within functional limits   Ice Chips Ice chips: Within functional limits   Thin Liquid Thin Liquid: Impaired Presentation: Spoon;Cup;Self Fed Pharyngeal  Phase Impairments: Throat Clearing - Delayed    Nectar Thick Nectar Thick Liquid: Not tested   Honey Thick Honey Thick Liquid: Not tested   Puree Puree: Within functional limits   Solid     Solid: Not tested      James Estrada 09/27/2022,10:38 AM  James Folks L. Samson Frederic, MA CCC/SLP Clinical Specialist - Acute Care SLP Acute Rehabilitation Services Office number [phone]

## 2022-09-27 NOTE — Progress Notes (Signed)
TCTS Progress Note: S/p dissection repair.   AF  w RVR for 2.5 hours today. Bolus amio followed by 60 then now transitioning to 30 gtt. . Converted back.   Otherwise stable. Dobhoff in. Patient moving more today.      Latest Ref Rng & Units 09/27/2022    5:30 AM 09/24/2022    4:19 AM 09/23/2022    4:18 AM  CBC  WBC 4.0 - 10.5 K/uL 19.2  14.4  14.0   Hemoglobin 13.0 - 17.0 g/dL 9.0  8.0  8.3   Hematocrit 39.0 - 52.0 % 30.5  26.6  26.0   Platelets 150 - 400 K/uL 249  118  78        Latest Ref Rng & Units 09/27/2022    5:30 AM 09/26/2022   10:47 AM 09/24/2022    4:19 AM  CMP  Glucose 70 - 99 mg/dL 675  449  201   BUN 8 - 23 mg/dL 41  39  40   Creatinine 0.61 - 1.24 mg/dL 0.07  1.21  9.75   Sodium 135 - 145 mmol/L 151  153  149   Potassium 3.5 - 5.1 mmol/L 3.8  4.1  3.8   Chloride 98 - 111 mmol/L 116  118  113   CO2 22 - 32 mmol/L 28  28  30    Calcium 8.9 - 10.3 mg/dL 8.7  9.1  8.2     ABG    Component Value Date/Time   PHART 7.479 (H) 09/20/2022 0830   PCO2ART 34.0 09/20/2022 0830   PO2ART 66 (L) 09/20/2022 0830   HCO3 25.2 09/20/2022 0830   TCO2 26 09/20/2022 0830   ACIDBASEDEF 1.0 09/18/2022 1629   O2SAT 94 09/20/2022 0830

## 2022-09-27 NOTE — Progress Notes (Signed)
Advanced Heart Failure Rounding Note  PCP-Cardiologist: None   Subjective:    Sitting up in bed.  Follows intermittent commands. Wil answer some questions. Rhythm stable. No CP or SOB. SBP 150-160s persistently.    Objective:   Weight Range: 81.6 kg Body mass index is 24.4 kg/m.   Vital Signs:   Temp:  [97.9 F (36.6 C)-98.9 F (37.2 C)] 98.7 F (37.1 C) (11/13 2300) Pulse Rate:  [60-129] 65 (11/14 0400) Resp:  [17-28] 21 (11/14 0400) BP: (130-169)/(53-98) 155/68 (11/14 0400) SpO2:  [81 %-98 %] 96 % (11/14 0400) Last BM Date : 09/26/22  Weight change: Filed Weights   09/24/22 0500 09/25/22 0400 09/26/22 0500  Weight: 84.3 kg 81.4 kg 81.6 kg    Intake/Output:   Intake/Output Summary (Last 24 hours) at 09/27/2022 0531 Last data filed at 09/27/2022 0400 Gross per 24 hour  Intake 1488 ml  Output 1275 ml  Net 213 ml       Physical Exam   General:  Weak appearing. No resp difficulty HEENT: normal + cor-trak Neck: supple. no JVD. Carotids 2+ bilat; no bruits. No lymphadenopathy or thryomegaly appreciated. Cor: PMI nondisplaced. Regular rate & rhythm. No rubs, gallops or murmurs. Lungs: clear Abdomen: soft, nontender, nondistended. No hepatosplenomegaly. No bruits or masses. Good bowel sounds. Extremities: no cyanosis, clubbing, rash, edema Neuro: alert & orientedx3, cranial nerves grossly intact. moves all 4 extremities w/o difficulty. Affect pleasant Neuro: alert follows some commands. Weak on left    Telemetry   NSR 60s Personally reviewed   Labs    CBC No results for input(s): "WBC", "NEUTROABS", "HGB", "HCT", "MCV", "PLT" in the last 72 hours.  Basic Metabolic Panel Recent Labs    09/26/22 1047  NA 153*  K 4.1  CL 118*  CO2 28  GLUCOSE 165*  BUN 39*  CREATININE 1.14  CALCIUM 9.1    Liver Function Tests No results for input(s): "AST", "ALT", "ALKPHOS", "BILITOT", "PROT", "ALBUMIN" in the last 72 hours.  No results for input(s):  "LIPASE", "AMYLASE" in the last 72 hours. Cardiac Enzymes No results for input(s): "CKTOTAL", "CKMB", "CKMBINDEX", "TROPONINI" in the last 72 hours.  BNP: BNP (last 3 results) No results for input(s): "BNP" in the last 8760 hours.  ProBNP (last 3 results) No results for input(s): "PROBNP" in the last 8760 hours.   D-Dimer No results for input(s): "DDIMER" in the last 72 hours.  Hemoglobin A1C No results for input(s): "HGBA1C" in the last 72 hours.  Fasting Lipid Panel No results for input(s): "CHOL", "HDL", "LDLCALC", "TRIG", "CHOLHDL", "LDLDIRECT" in the last 72 hours.  Thyroid Function Tests No results for input(s): "TSH", "T4TOTAL", "T3FREE", "THYROIDAB" in the last 72 hours.  Invalid input(s): "FREET3"  Other results:   Imaging    No results found.   Medications:     Scheduled Medications:  acetaminophen  1,000 mg Oral Q6H   Or   acetaminophen (TYLENOL) oral liquid 160 mg/5 mL  1,000 mg Per Tube Q6H   acetaminophen       amiodarone  200 mg Per Tube BID   aspirin  81 mg Per Tube Daily   Chlorhexidine Gluconate Cloth  6 each Topical Daily   feeding supplement (PROSource TF20)  60 mL Per Tube Daily   free water  200 mL Per Tube Q4H   insulin aspart  0-24 Units Subcutaneous Q4H   metoprolol tartrate  12.5 mg Oral BID   Or   metoprolol tartrate  12.5 mg Per  Tube BID   sodium chloride flush  10-40 mL Intracatheter Q12H   sodium chloride flush  10-40 mL Intracatheter Q12H   sodium chloride flush  3 mL Intravenous Q12H    Infusions:  sodium chloride     sodium chloride Stopped (09/24/22 1043)   feeding supplement (PEPTAMEN 1.5 CAL) 1,000 mL (09/27/22 0409)   lactated ringers     lactated ringers Stopped (09/20/22 1403)    PRN Medications: sodium chloride, acetaminophen, levalbuterol, metoprolol tartrate, ondansetron (ZOFRAN) IV, mouth rinse, mouth rinse, sodium chloride flush, sodium chloride flush, sodium chloride flush, traMADol    Patient Profile    81 y/o male w/ h/o HTN, HLD, AAA s/p stent graft admitted w/ Type 1 Aortic Dissection, s/p emergent repair.   Assessment/Plan   1. Type 1 Aortic Dissection  - s/p emergent repair 11/4 - management per CT surgery    2. Hypertension  - BP climbing slowly. Had been letting BP run due to stroke.  - Now persistently 150-160. Wil add losartan 25 daily   3. HFpEF - Intra-op TEE EF 60-65%, RV ok - CVP low. Holding diuretics  4. ABLA/ Thrombocytopenia  - dissection w/ RBP + expected blood loss in OR - required multiple units of PRBC's, Plts, FFP, and Cryoprecipitate in OR - Hgb stable   5. Post-Op PAF - in NSR. Continue po amio - no AC with hemorrhagic component of stroke   6. Pericardial Effusion  - mod by Intra-op TEE, no tamponade - hemodynamics stable - repeat limited echo today   7. Acute Hypoxic Respiratory Failure - Extubated 11/9 to 4 liters [place].    8. Rt Sided Strokes   - CT head 11/6 showed 3 small right-sided strokes 1 in the cerebellum 1 in the right basal ganglia and 1 in the frontal lobe with a 4 mm area of bleeding  - MRI last night stable - CTA - R carotid involved with dissection   9. AKI  - B/l Scr ~1.0>>spiked to 1.90  - suspect ATN from hemorrhagic shock/hypotension, now post resuscitation - Resolved  10. Hypokalemia - Stable.  - Needs BMET today  11. Hypernatremia  - Na 153 yesterday. Increase FW - BMET today  Continue Neuro/PT/OT support.   For CIR when bed approved and bed available  Length of Stay: 10  Arvilla Meres, MD  09/27/2022, 5:31 AM  Advanced Heart Failure Team Pager [phone] (M-F; 7a - 5p)  Please contact [facility] for night-coverage after hours (5p -7a ) and weekends on [email or web address]

## 2022-09-27 NOTE — Progress Notes (Incomplete)
Echocardiogram 2D Echocardiogram has been performed.  James Estrada 09/27/2022, 2:47 PM

## 2022-09-27 NOTE — Progress Notes (Signed)
Inpatient Rehabilitation Admissions Coordinator   I met with patient and his wife at bedside. I have insurance approval when patient is medically ready to discharge to CIR. Noted increased WBC and now on amio gtt. I will follow.  Danne Baxter, RN, MSN Rehab Admissions Coordinator [phone] 09/27/2022 11:10 AM

## 2022-09-28 DIAGNOSIS — Z9889 Other specified postprocedural states: Secondary | ICD-10-CM | POA: Diagnosis not present

## 2022-09-28 LAB — BASIC METABOLIC PANEL
Anion gap: 7 (ref 5–15)
BUN: 37 mg/dL — ABNORMAL HIGH (ref 8–23)
CO2: 26 mmol/L (ref 22–32)
Calcium: 8.5 mg/dL — ABNORMAL LOW (ref 8.9–10.3)
Chloride: 115 mmol/L — ABNORMAL HIGH (ref 98–111)
Creatinine, Ser: 0.94 mg/dL (ref 0.61–1.24)
GFR, Estimated: >60 mL/min (ref >60)
Glucose, Bld: 195 mg/dL — ABNORMAL HIGH (ref 70–99)
Potassium: 3.7 mmol/L (ref 3.5–5.1)
Sodium: 148 mmol/L — ABNORMAL HIGH (ref 135–145)

## 2022-09-28 LAB — COMPREHENSIVE METABOLIC PANEL
ALT: 25 U/L (ref 0–44)
AST: 29 U/L (ref 15–41)
Albumin: 2.4 g/dL — ABNORMAL LOW (ref 3.5–5.0)
Alkaline Phosphatase: 62 U/L (ref 38–126)
Anion gap: 6 (ref 5–15)
BUN: 35 mg/dL — ABNORMAL HIGH (ref 8–23)
CO2: 26 mmol/L (ref 22–32)
Calcium: 8.6 mg/dL — ABNORMAL LOW (ref 8.9–10.3)
Chloride: 118 mmol/L — ABNORMAL HIGH (ref 98–111)
Creatinine, Ser: 1 mg/dL (ref 0.61–1.24)
GFR, Estimated: >60 mL/min (ref >60)
Glucose, Bld: 215 mg/dL — ABNORMAL HIGH (ref 70–99)
Potassium: 4 mmol/L (ref 3.5–5.1)
Sodium: 150 mmol/L — ABNORMAL HIGH (ref 135–145)
Total Bilirubin: 0.6 mg/dL (ref 0.3–1.2)
Total Protein: 5.5 g/dL — ABNORMAL LOW (ref 6.5–8.1)

## 2022-09-28 LAB — CBC
HCT: 28.6 % — ABNORMAL LOW (ref 39.0–52.0)
Hemoglobin: 9 g/dL — ABNORMAL LOW (ref 13.0–17.0)
MCH: 29.1 pg (ref 26.0–34.0)
MCHC: 31.5 g/dL (ref 30.0–36.0)
MCV: 92.6 fL (ref 80.0–100.0)
Platelets: 281 10*3/uL (ref 150–400)
RBC: 3.09 MIL/uL — ABNORMAL LOW (ref 4.22–5.81)
RDW: 15.1 % (ref 11.5–15.5)
WBC: 20.4 10*3/uL — ABNORMAL HIGH (ref 4.0–10.5)
nRBC: 0.1 % (ref 0.0–0.2)

## 2022-09-28 LAB — URINALYSIS, ROUTINE W REFLEX MICROSCOPIC
Bacteria, UA: NONE SEEN
Bilirubin Urine: NEGATIVE
Glucose, UA: NEGATIVE mg/dL
Ketones, ur: NEGATIVE mg/dL
Leukocytes,Ua: NEGATIVE
Nitrite: NEGATIVE
Protein, ur: NEGATIVE mg/dL
Specific Gravity, Urine: 1.024 (ref 1.005–1.030)
pH: 5 (ref 5.0–8.0)

## 2022-09-28 LAB — GLUCOSE, CAPILLARY
Glucose-Capillary: 137 mg/dL — ABNORMAL HIGH (ref 70–99)
Glucose-Capillary: 164 mg/dL — ABNORMAL HIGH (ref 70–99)
Glucose-Capillary: 148 mg/dL — ABNORMAL HIGH (ref 70–99)
Glucose-Capillary: 146 mg/dL — ABNORMAL HIGH (ref 70–99)
Glucose-Capillary: 125 mg/dL — ABNORMAL HIGH (ref 70–99)
Glucose-Capillary: 150 mg/dL — ABNORMAL HIGH (ref 70–99)

## 2022-09-28 MED ORDER — NUTRISOURCE FIBER PO PACK
1.0000 | PACK | Freq: Two times a day (BID) | ORAL | Status: DC
  Administered 2022-09-28 – 2022-09-29 (×4): 1
  Filled 2022-09-28 (×4): qty 1

## 2022-09-28 MED ORDER — POTASSIUM CHLORIDE 20 MEQ PO PACK
40.0000 meq | PACK | Freq: Once | ORAL | Status: AC
  Administered 2022-09-28: 40 meq via ORAL
  Filled 2022-09-28: qty 2

## 2022-09-28 NOTE — Progress Notes (Signed)
Occupational Therapy Treatment Patient Details Name: James Estrada MRN: [id number] DOB: 09/02/1941 Today's Date: 09/28/2022   History of present illness Pt is a 81 y/o M admitted to [facility] on 09/17/22 with BLE weakness, chest + back pain, admitted with CT angio shows type dissection with thrombosed false lumen. Repair of type 1 aortic dissection performed 11/4. Head CT 11/6 acute infarcts within R MCA involvement, including R cerebellum, basal ganglia, frontal lobe. ETT 11/4-11/9. PMH AAA s/p endovascular repair (2020), HTN, HLD, GERD.   OT comments  [name] is making steady progress, he was seen with PT to safely progress functional mobility and tolerance. Overall he was mod A+2 for all aspects of mobility this date and benefits from simple and direct one step commands for all tasks. R field occlusion glasses donned to encourage midline visual attention. [name] remains incontinent and required max A +2 for hygiene in standing and tolerated ~3 minutes of supported standing with LLE blocked. OT to continue to follow acutely. POC remains appropriate.    Recommendations for follow up therapy are one component of a multi-disciplinary discharge planning process, led by the attending physician.  Recommendations may be updated based on patient status, additional functional criteria and insurance authorization.    Follow Up Recommendations  Acute inpatient rehab (3hours/day)     Assistance Recommended at Discharge Frequent or constant Supervision/Assistance  Patient can return home with the following  Two people to help with walking and/or transfers;Two people to help with bathing/dressing/bathroom;Assistance with cooking/housework;Assistance with feeding;Direct supervision/assist for medications management;Direct supervision/assist for financial management;Assist for transportation;Help with stairs or ramp for entrance   Equipment Recommendations  Other (comment)       Precautions / Restrictions  Precautions Precautions: Fall;Other (comment);Sternal Precaution Booklet Issued: No Precaution Comments: [place], fecal incontinence Restrictions Weight Bearing Restrictions: No       Mobility Bed Mobility Overal bed mobility: Needs Assistance Bed Mobility: Supine to Sit     Supine to sit: Max assist, +2 for physical assistance     General bed mobility comments: going to L hemi side - cues and assist for hooking LLE    Transfers Overall transfer level: Needs assistance Equipment used: 2 person hand held assist Transfers: Sit to/from Stand Sit to Stand: Mod assist, +2 physical assistance, +2 safety/equipment   Squat pivot transfers: Mod assist, +2 physical assistance, +2 safety/equipment       General transfer comment: once static standing pt progresses to min A. requires assist for LLE blocking, movement and weight shift to pivot to his stronger R side     Balance Overall balance assessment: Needs assistance Sitting-balance support: Single extremity supported, Bilateral upper extremity supported, Feet supported, Feet unsupported Sitting balance-Leahy Scale: Fair     Standing balance support: During functional activity Standing balance-Leahy Scale: Fair Standing balance comment: able to statically sit unsupported with hands on his knees.                           ADL either performed or assessed with clinical judgement   ADL Overall ADL's : Needs assistance/impaired     Grooming: Minimal assistance;Set up;Sitting Grooming Details (indicate cue type and reason): washed hands 2x. cues to involve L hand                 Toilet Transfer: Moderate assistance;+2 for safety/equipment;+2 for physical assistance Toilet Transfer Details (indicate cue type and reason): simulated bed>chair Toileting- Clothing Manipulation and Hygiene: Maximal assistance;+2 for safety/equipment;+2  for physical assistance Toileting - Clothing Manipulation Details (indicate cue  type and reason): incontinent upon standing, max A +2 for standing balance and hygiene     Functional mobility during ADLs: Moderate assistance;+2 for physical assistance;+2 for safety/equipment General ADL Comments: session limited by incontinent BM. R field occulsion glasses donned with some improvement noted in midline gaze    Extremity/Trunk Assessment Upper Extremity Assessment Upper Extremity Assessment: LUE deficits/detail;RUE deficits/detail RUE Deficits / Details: Seemingly WFL. LUE Deficits / Details: L sensory motor deficits, moving through ~50% of range against gravity. poor prioprioception coupled with L inattention LUE Sensation: decreased proprioception LUE Coordination: decreased fine motor;decreased gross motor   Lower Extremity Assessment Lower Extremity Assessment: Defer to PT evaluation        Vision   Vision Assessment?: Vision impaired- to be further tested in functional context Additional Comments: crossing midline with cues, continues to have difficulty with sustaining midline and L visual attention. R field occlusion glasses donned          Cognition Arousal/Alertness: Awake/alert Behavior During Therapy: Flat affect Overall Cognitive Status: Difficult to assess                                 General Comments: Cognition ultimately difficult to assess due to apparent aphasia. Able to state name and daughters name. Answers yes/no appropriately most of the time when given increased time. Follows most direct verbal cues.              General Comments VSS on RA - incontinent BM    Pertinent Vitals/ Pain       Pain Assessment Pain Assessment: Faces Faces Pain Scale: No hurt Pain Intervention(s): Monitored during session   Frequency  Min 3X/week        Progress Toward Goals  OT Goals(current goals can now be found in the care plan section)  Progress towards OT goals: Progressing toward goals  Acute Rehab OT Goals OT Goal  Formulation: With family Time For Goal Achievement: 10/07/22 Potential to Achieve Goals: Good ADL Goals Pt Will Perform Grooming: with min assist;sitting Additional ADL Goal #1: Pt will follow one step commands with 75% accuracy. Additional ADL Goal #2: Pt will perform bed mobility with mod assist in preparation for ADLs. Additional ADL Goal #3: Pt will turn head 45 degrees toward L to locate visual target with multimodal cues 3/5 trials. Additional ADL Goal #4: Pt will demonstrate fair sitting balance as a precursor to ADLs.  Plan Discharge plan remains appropriate    Co-evaluation    PT/OT/SLP Co-Evaluation/Treatment: Yes Reason for Co-Treatment: Complexity of the patient's impairments (multi-system involvement);For patient/therapist safety;To address functional/ADL transfers   OT goals addressed during session: ADL's and self-care      AM-PAC OT "6 Clicks" Daily Activity     Outcome Measure   Help from another person eating meals?: Total Help from another person taking care of personal grooming?: A Lot Help from another person toileting, which includes using toliet, bedpan, or urinal?: A Lot Help from another person bathing (including washing, rinsing, drying)?: A Lot Help from another person to put on and taking off regular upper body clothing?: A Lot Help from another person to put on and taking off regular lower body clothing?: Total 6 Click Score: 10    End of Session Equipment Utilized During Treatment: Gait belt  OT Visit Diagnosis: Unsteadiness on feet (R26.81);Muscle weakness (generalized) (M62.81);Hemiplegia and hemiparesis;Other symptoms  and signs involving cognitive function Hemiplegia - Right/Left: Left Hemiplegia - caused by: Cerebral infarction   Activity Tolerance Patient tolerated treatment well   Patient Left in chair;with call bell/phone within reach   Nurse Communication Mobility status        Time: 7026-3785 OT Time Calculation (min): 32  min  Charges: OT General Charges $OT Visit: 1 Visit OT Treatments $Self Care/Home Management : 8-22 mins    Donia Pounds 09/28/2022, 10:05 AM

## 2022-09-28 NOTE — Progress Notes (Signed)
Advanced Heart Failure Rounding Note  PCP-Cardiologist: None   Subjective:    Had AF yesterday. IV amio restarted. Now back in NSR.   Awake follows some commands. No CP or SOB   SBP still 150-160 after losartan added  Objective:   Weight Range: 83.5 kg Body mass index is 24.97 kg/m.   Vital Signs:   Temp:  [97.5 F (36.4 C)-98.3 F (36.8 C)] 97.6 F (36.4 C) (11/15 0348) Pulse Rate:  [53-147] 67 (11/15 0600) Resp:  [13-27] 20 (11/15 0600) BP: (107-171)/(57-96) 163/65 (11/15 0600) SpO2:  [84 %-100 %] 95 % (11/15 0600) Weight:  [83.5 kg] 83.5 kg (11/15 0500) Last BM Date : 09/27/22  Weight change: Filed Weights   09/26/22 0500 09/27/22 0500 09/28/22 0500  Weight: 81.6 kg 81.8 kg 83.5 kg    Intake/Output:   Intake/Output Summary (Last 24 hours) at 09/28/2022 0729 Last data filed at 09/28/2022 0600 Gross per 24 hour  Intake 1773.88 ml  Output 700 ml  Net 1073.88 ml       Physical Exam   General:  Weak appearing. No resp difficulty HEENT: normal + cor-trak Neck: supple. no JVD. Carotids 2+ bilat; no bruits. No lymphadenopathy or thryomegaly appreciated. Cor: PMI nondisplaced. Regular rate & rhythm. No rubs, gallops or murmurs. Lungs: clear Abdomen: soft, nontender, nondistended. No hepatosplenomegaly. No bruits or masses. Good bowel sounds. Extremities: no cyanosis, clubbing, rash, edema Neuro: alert intermittently follows command. Weak on left    Telemetry   NSR 50-60s Personally reviewed   Labs    CBC Recent Labs    09/27/22 0530 09/28/22 0500  WBC 19.2* 20.4*  HGB 9.0* 9.0*  HCT 30.5* 28.6*  MCV 94.4 92.6  PLT 249 [id number]    Basic Metabolic Panel Recent Labs    09/27/22 0530 09/28/22 0500  NA 151* 148*  K 3.8 3.7  CL 116* 115*  CO2 28 26  GLUCOSE 157* 195*  BUN 41* 37*  CREATININE 1.07 0.94  CALCIUM 8.7* 8.5*    Liver Function Tests No results for input(s): "AST", "ALT", "ALKPHOS", "BILITOT", "PROT", "ALBUMIN" in the last  72 hours.  No results for input(s): "LIPASE", "AMYLASE" in the last 72 hours. Cardiac Enzymes No results for input(s): "CKTOTAL", "CKMB", "CKMBINDEX", "TROPONINI" in the last 72 hours.  BNP: BNP (last 3 results) No results for input(s): "BNP" in the last 8760 hours.  ProBNP (last 3 results) No results for input(s): "PROBNP" in the last 8760 hours.   D-Dimer No results for input(s): "DDIMER" in the last 72 hours.  Hemoglobin A1C No results for input(s): "HGBA1C" in the last 72 hours.  Fasting Lipid Panel No results for input(s): "CHOL", "HDL", "LDLCALC", "TRIG", "CHOLHDL", "LDLDIRECT" in the last 72 hours.  Thyroid Function Tests No results for input(s): "TSH", "T4TOTAL", "T3FREE", "THYROIDAB" in the last 72 hours.  Invalid input(s): "FREET3"  Other results:   Imaging    ECHOCARDIOGRAM LIMITED  Result Date: 09/27/2022    ECHOCARDIOGRAM LIMITED REPORT   Patient Name:   JERIME GONYA Date of Exam: 09/27/2022 Medical Rec #:  [id number]        Height:       72.0 in Accession #:    [id number]       Weight:       180.3 lb Date of Birth:  1941/04/10       BSA:          2.039 m Patient Age:    81 years  BP:           155/74 mmHg Patient Gender: M                HR:           73 bpm. Exam Location:  Inpatient Procedure: Limited Echo and Limited Color Doppler Indications:    Pericardial effusion I31.3  History:        Patient has prior history of Echocardiogram examinations, most                 recent 09/18/2022. Arrythmias:Bradycardia; Risk                 Factors:Hypertension and Dyslipidemia.  Sonographer:    Ronny Flurry Referring Phys: [place]  1. Left ventricular ejection fraction, by estimation, is 50 to 55%. The left ventricle has low normal function.  2. There is no evidence of cardiac tamponade.  3. Trivial mitral valve regurgitation.  4. Aortic valve regurgitation is trivial.  5. Aortic root/ascending aorta has been repaired/replaced.  6.  The inferior vena cava is normal in size with greater than 50% respiratory variability, suggesting right atrial pressure of 3 mmHg. Conclusion(s)/Recommendation(s): Limited echo for effusion. Very minimal pericardial effusion seen posterior to LV. FINDINGS  Left Ventricle: Left ventricular ejection fraction, by estimation, is 50 to 55%. The left ventricle has low normal function. Abnormal (paradoxical) septal motion consistent with post-operative status. Pericardium: Trivial pericardial effusion is present. The pericardial effusion is posterior to the left ventricle. There is no evidence of cardiac tamponade. Mitral Valve: Trivial mitral valve regurgitation. Aortic Valve: Aortic valve regurgitation is trivial. Aortic valve mean gradient measures 4.7 mmHg. Aortic valve peak gradient measures 8.8 mmHg. Aorta: The aortic root/ascending aorta has been repaired/replaced. Venous: The inferior vena cava is normal in size with greater than 50% respiratory variability, suggesting right atrial pressure of 3 mmHg. LEFT VENTRICLE PLAX 2D LVIDd:         4.60 cm LVIDs:         3.30 cm LV PW:         1.20 cm LV IVS:        1.10 cm LVOT diam:     2.30 cm LVOT Area:     4.15 cm  IVC IVC diam: 1.10 cm LEFT ATRIUM         Index LA diam:    3.80 cm 1.86 cm/m  AORTIC VALVE AV Vmax:      148.33 cm/s AV Vmean:     101.367 cm/s AV VTI:       0.272 m AV Peak Grad: 8.8 mmHg AV Mean Grad: 4.7 mmHg  AORTA Ao Root diam: 3.70 cm Ao Asc diam:  3.00 cm  SHUNTS Systemic Diam: 2.30 cm Buford Dresser MD Electronically signed by Buford Dresser MD Signature Date/Time: 09/27/2022/5:17:34 PM    Final      Medications:     Scheduled Medications:  acetaminophen  1,000 mg Oral Q6H   Or   acetaminophen (TYLENOL) oral liquid 160 mg/5 mL  1,000 mg Per Tube Q6H   aspirin  81 mg Per Tube Daily   Chlorhexidine Gluconate Cloth  6 each Topical Daily   feeding supplement (PROSource TF20)  60 mL Per Tube Daily   free water  200 mL Per  Tube Q4H   insulin aspart  0-24 Units Subcutaneous Q4H   losartan  50 mg Oral Daily   metoprolol tartrate  12.5 mg Oral BID  Or   metoprolol tartrate  12.5 mg Per Tube BID   sodium chloride flush  10-40 mL Intracatheter Q12H   sodium chloride flush  10-40 mL Intracatheter Q12H   sodium chloride flush  3 mL Intravenous Q12H    Infusions:  sodium chloride     sodium chloride Stopped (09/24/22 1043)   amiodarone 30 mg/hr (09/28/22 0600)   feeding supplement (PEPTAMEN 1.5 CAL) 55 mL/hr at 09/28/22 0600   lactated ringers     lactated ringers Stopped (09/20/22 1403)    PRN Medications: sodium chloride, levalbuterol, metoprolol tartrate, ondansetron (ZOFRAN) IV, mouth rinse, mouth rinse, sodium chloride flush, sodium chloride flush, sodium chloride flush, traMADol    Patient Profile   81 y/o male w/ h/o HTN, HLD, AAA s/p stent graft admitted w/ Type 1 Aortic Dissection, s/p emergent repair.   Assessment/Plan   1. Type 1 Aortic Dissection  - s/p emergent repair 11/4 - management per CT surgery    2. Hypertension  - BP climbing slowly. Had been letting BP run due to stroke.  - Now persistently 150-160. Wil increase losartan to 50 daily   3. HFpEF - Intra-op TEE EF 60-65%, RV ok - volume status ok off diuretics  4. ABLA/ Thrombocytopenia  - dissection w/ RBP + expected blood loss in OR - required multiple units of PRBC's, Plts, FFP, and Cryoprecipitate in OR - Hgb stable   5. Post-Op PAF - had recurrent AF on 11/14. Now back in NSR on IV amio  - wil continue IV amio load one more day. Then switch back to amio 200 bid - no AC with hemorrhagic component of stroke   6. Pericardial Effusion  - mod by Intra-op TEE, no tamponade - hemodynamics stable - repeat limited echo today   7. Acute Hypoxic Respiratory Failure - Extubated 11/9 to 4 liters [place].    8. Rt Sided Strokes   - CT head 11/6 showed 3 small right-sided strokes 1 in the cerebellum 1 in the right basal  ganglia and 1 in the frontal lobe with a 4 mm area of bleeding  - MRI last night stable - CTA - R carotid involved with dissection   9. AKI  - B/l Scr ~1.0>>spiked to 1.90  - suspect ATN from hemorrhagic shock/hypotension, now post resuscitation - Resolved  10. Hypokalemia - K 3.7 today. - wil supp  11. Hypernatremia  - Na 148 - Continue FW  Continue Neuro/PT/OT support.   Pending CIR.   Length of Stay: 80  Arvilla Meres, MD  09/28/2022, 7:29 AM  Advanced Heart Failure Team Pager [phone] (M-F; 7a - 5p)  Please contact [facility] for night-coverage after hours (5p -7a ) and weekends on [email or web address]

## 2022-09-28 NOTE — Progress Notes (Signed)
Speech Language Pathology Treatment: Dysphagia;Cognitive-Linquistic  Patient Details Name: James Estrada MRN: [id number] DOB: 09/09/1941 Today's Date: 09/28/2022 Time: 7591-6384 SLP Time Calculation (min) (ACUTE ONLY): 16 min  Assessment / Plan / Recommendation Clinical Impression  Pt upright in chair following PT/OT session. His responses were delayed, requiring repetition, additional time and intermittent dysfluency. Pt attempted to make needs known in short phrase level with cues given for clarity and appropriate context. He was oriented to hospital with phonemic cues.  Hand over hand assist provided during consumption of cup sips water and applesauce. He continues to exhibit concern for inadequate airway protection. Prior to intake there were reflexive throat clears and following water immediate coughing. Oral manipulation, transit and onset of swallow appeared adequate. Recommend instrumental swallow evaluation with plan to schedule MBS 11/16 (radiology unable to accommodate today). He may continue to have ice chips following oral care.    HPI HPI: Pt is a 81 y/o M admitted to [facility] on 09/17/22 with type 1 aortic dissection requiring emergent repair. Head CT 11/6 acute infarcts within R MCA involvement, including R cerebellum, basal ganglia, frontal lobe. ETT 11/4-11/9. PMH AAA s/p endovascular repair (2020), HTN, HLD, GERD.      SLP Plan  MBS      Recommendations for follow up therapy are one component of a multi-disciplinary discharge planning process, led by the attending physician.  Recommendations may be updated based on patient status, additional functional criteria and insurance authorization.    Recommendations  Diet recommendations: NPO Medication Administration: Via alternative means                Oral Care Recommendations: Oral care QID Follow Up Recommendations: Acute inpatient rehab (3hours/day) Plan: MBS           Royce Macadamia  09/28/2022,  11:35 AM

## 2022-09-28 NOTE — Progress Notes (Signed)
Physical Therapy Treatment Patient Details Name: James Estrada MRN: [id number] DOB: [birth year] Today's Date: 09/28/2022   History of Present Illness Pt is a 81 y/o M admitted to [facility] on 09/17/22 with BLE weakness, chest + back pain, admitted with CT angio shows type dissection with thrombosed false lumen. Repair of type 1 aortic dissection performed 11/4. Head CT 11/6 acute infarcts within R MCA involvement, including R cerebellum, basal ganglia, frontal lobe. ETT 11/4-11/9. PMH AAA s/p endovascular repair (2020), HTN, HLD, GERD.    PT Comments    Pt progressing with mobility. Today's session focused on transfers and visually crossing midline due to L inattention. 3-4 sit to stands modA+2, minA+2 once fully upright, dependent for pericare. Max cues for initiating LLE step during transfer. Pt provided correct answer to questions ~50% of the time, allowing for up to 5 seconds for delayed processing. Pt remains limited by generalized weakness, decreased activity tolerance, and impaired balance strategies/postural reactions. Continue to recommend acute PT services to maximize functional mobility and independence prior to d/c to AIR level therapies.    Recommendations for follow up therapy are one component of a multi-disciplinary discharge planning process, led by the attending physician.  Recommendations may be updated based on patient status, additional functional criteria and insurance authorization.  Follow Up Recommendations  Acute inpatient rehab (3hours/day)     Assistance Recommended at Discharge Frequent or constant Supervision/Assistance  Patient can return home with the following Two people to help with walking and/or transfers;Two people to help with bathing/dressing/bathroom;Assistance with cooking/housework;Assistance with feeding;Assist for transportation;Help with stairs or ramp for entrance   Equipment Recommendations  Other (comment) (TBD)    Recommendations for Other  Services       Precautions / Restrictions Precautions Precautions: Fall;Other (comment);Sternal Precaution Booklet Issued: No Precaution Comments: [place], fecal incontinence Restrictions Weight Bearing Restrictions: No     Mobility  Bed Mobility Overal bed mobility: Needs Assistance Bed Mobility: Supine to Sit     Supine to sit: Max assist, +2 for physical assistance     General bed mobility comments: going to L hemi side - cues and assist for hooking LLE    Transfers Overall transfer level: Needs assistance Equipment used: 2 person hand held assist Transfers: Sit to/from Stand Sit to Stand: Mod assist, +2 physical assistance, +2 safety/equipment   Step pivot transfers: Mod assist, +2 physical assistance, +2 safety/equipment       General transfer comment: once static standing pt progresses to min A. requires assist for LLE blocking, movement and weight shift to pivot to his stronger R side; L knee kyperextension observed from supervising therapist; 3-4 sit to stands during session for pericare    Ambulation/Gait                   Stairs             Wheelchair Mobility    Modified Rankin (Stroke Patients Only) Modified Rankin (Stroke Patients Only) Pre-Morbid Rankin Score: No symptoms Modified Rankin: Severe disability     Balance Overall balance assessment: Needs assistance Sitting-balance support: Single extremity supported, Bilateral upper extremity supported, Feet supported, Feet unsupported Sitting balance-Leahy Scale: Fair Sitting balance - Comments: min guard sitting EOB; able to statically sit unsupported with hands on his knees.   Standing balance support: During functional activity, Bilateral upper extremity supported Standing balance-Leahy Scale: Poor Standing balance comment: standing with 2 HHA assist for bouts of time 30-90 sec during pericare  Cognition Arousal/Alertness:  Awake/alert Behavior During Therapy: Flat affect Overall Cognitive Status: Difficult to assess                                 General Comments: Cognition ultimately difficult to assess due to apparent aphasia. Able to state name and daughters name. Answers yes/no appropriately most of the time when given increased time. Follows most direct verbal cues.        Exercises      General Comments General comments (skin integrity, edema, etc.): VSS on RA      Pertinent Vitals/Pain Pain Assessment Faces Pain Scale: No hurt Pain Intervention(s): Monitored during session    Home Living                          Prior Function            PT Goals (current goals can now be found in the care plan section) Acute Rehab PT Goals Patient Stated Goal: none stated due to expressive difficulties PT Goal Formulation: With patient Time For Goal Achievement: 10/07/22 Potential to Achieve Goals: Fair Progress towards PT goals: Progressing toward goals    Frequency    Min 4X/week      PT Plan Current plan remains appropriate    Co-evaluation PT/OT/SLP Co-Evaluation/Treatment: Yes Reason for Co-Treatment: Complexity of the patient's impairments (multi-system involvement);For patient/therapist safety;To address functional/ADL transfers PT goals addressed during session: Mobility/safety with mobility;Balance;Strengthening/ROM OT goals addressed during session: ADL's and self-care      AM-PAC PT "6 Clicks" Mobility   Outcome Measure  Help needed turning from your back to your side while in a flat bed without using bedrails?: A Lot Help needed moving from lying on your back to sitting on the side of a flat bed without using bedrails?: A Lot Help needed moving to and from a bed to a chair (including a wheelchair)?: Total Help needed standing up from a chair using your arms (e.g., wheelchair or bedside chair)?: Total Help needed to walk in hospital room?:  Total Help needed climbing 3-5 steps with a railing? : Total 6 Click Score: 8    End of Session Equipment Utilized During Treatment: Gait belt Activity Tolerance: Patient limited by fatigue;Patient tolerated treatment well Patient left: with call bell/phone within reach;with family/visitor present;in chair;with chair alarm set;Other (comment) (With speech therapist present) Nurse Communication: Mobility status PT Visit Diagnosis: Other abnormalities of gait and mobility (R26.89);Muscle weakness (generalized) (M62.81);Other symptoms and signs involving the nervous system (K53.976)     Time: 7341-9379 PT Time Calculation (min) (ACUTE ONLY): 33 min  Charges:  $Therapeutic Activity: 8-22 mins                    Alric Ran, SPT    [place] Kieron Kantner 09/28/2022, 11:03 AM

## 2022-09-28 NOTE — Progress Notes (Signed)
CT surgery PM rounds  Patient resting comfortably had stable day Maintaining sinus rhythm Urinalysis checked because of elevated white count is clean Patient evaluated by speech therapy and modified barium swallow is planned, continue tube feeds for now  Blood pressure (!) 158/96, pulse 70, temperature 97.7 F (36.5 C), temperature source Axillary, resp. rate (!) 22, height 6' (1.829 m), weight 83.5 kg, SpO2 96 %.

## 2022-09-28 NOTE — Progress Notes (Signed)
11 Days Post-Op Procedure(s) (LRB): REPAIR OF ACUTE ASCENDING THORACIC AORTIC DISSECTION USING 28 MM HEMASHIELD PLATINUM VASCULAR GRAFT (N/A) Subjective: Denies pain  Objective: Vital signs in last 24 hours: Temp:  [97.5 F (36.4 C)-98.3 F (36.8 C)] 98.3 F (36.8 C) (11/15 0730) Pulse Rate:  [53-147] 67 (11/15 0600) Cardiac Rhythm: Normal sinus rhythm (11/15 0400) Resp:  [13-27] 20 (11/15 0600) BP: (107-171)/(57-96) 163/65 (11/15 0600) SpO2:  [84 %-100 %] 95 % (11/15 0600) Weight:  [83.5 kg] 83.5 kg (11/15 0500)  Hemodynamic parameters for last 24 hours:    Intake/Output from previous day: 11/14 0701 - 11/15 0700 In: 1773.9 [I.V.:508.9; NG/GT:1265] Out: 700 [Urine:700] Intake/Output this shift: No intake/output data recorded.  General appearance: alert, cooperative, and no distress Neurologic: right sided weakness Heart: regular rate and rhythm Lungs: diminished breath sounds bibasilar Abdomen: normal findings: soft, non-tender Wound: clean and dry  Lab Results: Recent Labs    09/27/22 0530 09/28/22 0500  WBC 19.2* 20.4*  HGB 9.0* 9.0*  HCT 30.5* 28.6*  PLT 249 281   BMET:  Recent Labs    09/27/22 0530 09/28/22 0500  NA 151* 148*  K 3.8 3.7  CL 116* 115*  CO2 28 26  GLUCOSE 157* 195*  BUN 41* 37*  CREATININE 1.07 0.94  CALCIUM 8.7* 8.5*    PT/INR: No results for input(s): "LABPROT", "INR" in the last 72 hours. ABG    Component Value Date/Time   PHART 7.479 (H) 09/20/2022 0830   HCO3 25.2 09/20/2022 0830   TCO2 26 09/20/2022 0830   ACIDBASEDEF 1.0 09/18/2022 1629   O2SAT 94 09/20/2022 0830   CBG (last 3)  Recent Labs    09/27/22 2352 09/28/22 0340 09/28/22 0729  GLUCAP 165* 137* 164*    Assessment/Plan: S/P Procedure(s) (LRB): REPAIR OF ACUTE ASCENDING THORACIC AORTIC DISSECTION USING 28 MM HEMASHIELD PLATINUM VASCULAR GRAFT (N/A) NEURO- alert this AM, still with left sided weakness but was able to grip weakly and move left toes to  command which is improved  Continune PT/OT/Speech CV- in SR now, was back in A fib last night requiring restarting IV amiodarone  HTN- on lopressor and Cozaar RESP- continue IS RENAL- creatinine normal- resolved ATN  Hypernatremia better  Supplement K  Appears euvolemic off diuretics ENDO- CBG moderately elevated GI- tolerating TF ID- WBC remains elevated without a clear source  Will straight cath for UA  Monitor for fevers  Plan for CIR once rhythm stabilized  LOS: 11 days    Loreli Slot 09/28/2022

## 2022-09-29 ENCOUNTER — Inpatient Hospital Stay (HOSPITAL_COMMUNITY): Payer: Medicare Other

## 2022-09-29 DIAGNOSIS — Z9889 Other specified postprocedural states: Secondary | ICD-10-CM | POA: Diagnosis not present

## 2022-09-29 LAB — BASIC METABOLIC PANEL
Anion gap: 10 (ref 5–15)
BUN: 34 mg/dL — ABNORMAL HIGH (ref 8–23)
CO2: 24 mmol/L (ref 22–32)
Calcium: 8.3 mg/dL — ABNORMAL LOW (ref 8.9–10.3)
Chloride: 114 mmol/L — ABNORMAL HIGH (ref 98–111)
Creatinine, Ser: 0.97 mg/dL (ref 0.61–1.24)
GFR, Estimated: >60 mL/min (ref >60)
Glucose, Bld: 187 mg/dL — ABNORMAL HIGH (ref 70–99)
Potassium: 3.6 mmol/L (ref 3.5–5.1)
Sodium: 148 mmol/L — ABNORMAL HIGH (ref 135–145)

## 2022-09-29 LAB — GLUCOSE, CAPILLARY
Glucose-Capillary: 143 mg/dL — ABNORMAL HIGH (ref 70–99)
Glucose-Capillary: 163 mg/dL — ABNORMAL HIGH (ref 70–99)
Glucose-Capillary: 119 mg/dL — ABNORMAL HIGH (ref 70–99)
Glucose-Capillary: 174 mg/dL — ABNORMAL HIGH (ref 70–99)
Glucose-Capillary: 174 mg/dL — ABNORMAL HIGH (ref 70–99)
Glucose-Capillary: 133 mg/dL — ABNORMAL HIGH (ref 70–99)

## 2022-09-29 LAB — CBC
HCT: 28.6 % — ABNORMAL LOW (ref 39.0–52.0)
Hemoglobin: 8.9 g/dL — ABNORMAL LOW (ref 13.0–17.0)
MCH: 28.7 pg (ref 26.0–34.0)
MCHC: 31.1 g/dL (ref 30.0–36.0)
MCV: 92.3 fL (ref 80.0–100.0)
Platelets: 296 10*3/uL (ref 150–400)
RBC: 3.1 MIL/uL — ABNORMAL LOW (ref 4.22–5.81)
RDW: 15.2 % (ref 11.5–15.5)
WBC: 25.7 10*3/uL — ABNORMAL HIGH (ref 4.0–10.5)
nRBC: 0.1 % (ref 0.0–0.2)

## 2022-09-29 LAB — PROCALCITONIN: Procalcitonin: <0.1 ng/mL

## 2022-09-29 MED ORDER — METOPROLOL TARTRATE 25 MG/10 ML ORAL SUSPENSION
25.0000 mg | Freq: Two times a day (BID) | ORAL | Status: DC
  Administered 2022-09-29: 25 mg
  Filled 2022-09-29: qty 10

## 2022-09-29 MED ORDER — POTASSIUM CHLORIDE 20 MEQ PO PACK
20.0000 meq | PACK | Freq: Once | ORAL | Status: AC
  Administered 2022-09-29: 20 meq via ORAL
  Filled 2022-09-29: qty 1

## 2022-09-29 MED ORDER — SPIRONOLACTONE 12.5 MG HALF TABLET
12.5000 mg | ORAL_TABLET | Freq: Every day | ORAL | Status: DC
  Administered 2022-09-29 – 2022-09-30 (×2): 12.5 mg via ORAL
  Filled 2022-09-29 (×2): qty 1

## 2022-09-29 MED ORDER — METOPROLOL TARTRATE 25 MG PO TABS
25.0000 mg | ORAL_TABLET | Freq: Two times a day (BID) | ORAL | Status: DC
  Administered 2022-09-29 – 2022-09-30 (×3): 25 mg via ORAL
  Filled 2022-09-29 (×3): qty 1

## 2022-09-29 MED ORDER — SODIUM CHLORIDE 0.9 % IV SOLN
2.0000 g | Freq: Three times a day (TID) | INTRAVENOUS | Status: DC
  Administered 2022-09-29 – 2022-09-30 (×4): 2 g via INTRAVENOUS
  Filled 2022-09-29 (×4): qty 12.5

## 2022-09-29 MED ORDER — ATORVASTATIN CALCIUM 40 MG PO TABS
40.0000 mg | ORAL_TABLET | Freq: Every day | ORAL | Status: DC
  Administered 2022-09-29: 40 mg
  Filled 2022-09-29: qty 1

## 2022-09-29 MED ORDER — SODIUM CHLORIDE 0.9 % IV SOLN: 1.0000 g | Freq: Two times a day (BID) | INTRAVENOUS | Status: DC

## 2022-09-29 MED ORDER — AMIODARONE HCL 200 MG PO TABS
400.0000 mg | ORAL_TABLET | Freq: Two times a day (BID) | ORAL | Status: DC
  Administered 2022-09-29 (×2): 400 mg
  Filled 2022-09-29 (×2): qty 2

## 2022-09-29 NOTE — TOC Initial Note (Signed)
Transition of Care [facility]) - Initial/Assessment Note    Patient Details  Name: James Estrada MRN: [id number] Date of Birth: Mar 31, 1941  Transition of Care [facility]) CM/SW Contact:    Erenest Rasher, RN Phone Number: [phone] 09/29/2022, 4:53 PM  Clinical Narrative:                  HF TOC CM spoke to pt and wife at bedside. States pt was independent PTA. Explained IP rehab will set up any DME or HH needed after his rehab at [facility].    Expected Discharge Plan: IP Rehab Facility Barriers to Discharge: Continued Medical Work up   Patient Goals and CMS Choice   CMS Medicare.gov Compare Post Acute Care list provided to:: Patient Represenative (must comment) (wife-Betty)    Expected Discharge Plan and Services Expected Discharge Plan: [place]   Discharge Planning Services: CM Consult   Living arrangements for the past 2 months: Single Family Home                                      Prior Living Arrangements/Services Living arrangements for the past 2 months: Single Family Home Lives with:: Spouse Patient language and need for interpreter reviewed:: Yes Do you feel safe going back to the place where you live?: Yes      Need for Family Participation in Patient Care: Yes (Comment) Care giver support system in place?: Yes (comment)   Criminal Activity/Legal Involvement Pertinent to Current Situation/Hospitalization: No - Comment as needed  Activities of Daily Living Home Assistive Devices/Equipment: None ADL Screening (condition at time of admission) Patient's cognitive ability adequate to safely complete daily activities?: Yes Is the patient deaf or have difficulty hearing?: No Does the patient have difficulty seeing, even when wearing glasses/contacts?: No Does the patient have difficulty concentrating, remembering, or making decisions?: No Patient able to express need for assistance with ADLs?: Yes Does the patient have difficulty dressing or  bathing?: No Independently performs ADLs?: Yes (appropriate for developmental age) Does the patient have difficulty walking or climbing stairs?: No Weakness of Legs: None Weakness of Arms/Hands: None  Permission Sought/Granted Permission sought to share information with : Case Manager, Family Supports, PCP Permission granted to share information with : Yes, Verbal Permission Granted  Share Information with NAME: Tysin Rarey     Permission granted to share info w Relationship: wife  Permission granted to share info w Contact Information: [phone]  Emotional Assessment Appearance:: Appears stated age Attitude/Demeanor/Rapport: Gracious Affect (typically observed): Accepting Orientation: : Oriented to Self   Psych Involvement: No (comment)  Admission diagnosis:  Left leg weakness [R29.898] Dissection of thoracoabdominal aorta (HCC) [I71.03] Retroperitoneal bleeding [R58] Status post surgery [Z98.890] S/P aortic dissection repair [id number] Patient Active Problem List   Diagnosis Date Noted   S/P aortic dissection repair 09/18/2022   Status post surgery 09/17/2022   Aneurysm of left internal iliac artery ([place]) 05/22/2022   Colon cancer screening 05/22/2022   Essential hypertension 05/22/2022   Hemorrhoids 05/22/2022   Inguinal hernia 05/22/2022   Irritable bowel syndrome 05/22/2022   Mixed hyperlipidemia 05/22/2022   Pure hypercholesterolemia 05/22/2022   Thrombocytopenia ([place]) 05/22/2022   Allergic rhinitis 05/22/2022   AAA (abdominal aortic aneurysm) without rupture ([place]) 05/04/2020   Trochanteric bursitis, right hip 08/20/2018   History of right hip replacement 08/20/2018   Status post total replacement of right hip 09/08/2017  Pain of right hip joint 08/07/2017   Unilateral primary osteoarthritis, right hip 08/07/2017   BPH (benign prostatic hypertrophy) with urinary obstruction 05/15/2013   PCP:  Renford Dills, MD Pharmacy:   [facility] [phone] -  [place], [place] - [address] [facility] [address] [place] [address] Phone: [phone] Fax: [phone]     Social Determinants of Health (SDOH) Interventions    Readmission Risk Interventions    05/05/2020   11:45 AM  Readmission Risk Prevention Plan  Post Dischage Appt Complete  Medication Screening Complete  Transportation Screening Complete

## 2022-09-29 NOTE — Progress Notes (Signed)
     [address]       [place] [address]             [phone]       EVENING ROUNDS  POD #12 dissection repair complicated with CVA Stable day on RA No significant change neuro today

## 2022-09-29 NOTE — Progress Notes (Signed)
12 Days Post-Op Procedure(s) (LRB): REPAIR OF ACUTE ASCENDING THORACIC AORTIC DISSECTION USING 28 MM HEMASHIELD PLATINUM VASCULAR GRAFT (N/A) Subjective: Alert this AM  Objective: Vital signs in last 24 hours: Temp:  [97.7 F (36.5 C)-98.4 F (36.9 C)] 98.3 F (36.8 C) (11/16 0739) Pulse Rate:  [58-71] 69 (11/16 0700) Cardiac Rhythm: Normal sinus rhythm (11/16 0400) Resp:  [17-24] 21 (11/16 0700) BP: (139-164)/(69-111) 158/71 (11/16 0700) SpO2:  [93 %-96 %] 95 % (11/16 0700) Weight:  [84.3 kg] 84.3 kg (11/16 0454)  Hemodynamic parameters for last 24 hours:    Intake/Output from previous day: 11/15 0701 - 11/16 0700 In: 1664 [I.V.:399; NG/GT:1265] Out: 850 [Urine:850] Intake/Output this shift: No intake/output data recorded.  General appearance: alert, cooperative, and no distress Neurologic: left sided weakness Heart: regular rate and rhythm Lungs: diminished breath sounds bibasilar Abdomen: normal findings: soft, non-tender Wound: clean and dry  Lab Results: Recent Labs    09/28/22 0500 09/29/22 0504  WBC 20.4* 25.7*  HGB 9.0* 8.9*  HCT 28.6* 28.6*  PLT 281 296   BMET:  Recent Labs    09/28/22 1141 09/29/22 0504  NA 150* 148*  K 4.0 3.6  CL 118* 114*  CO2 26 24  GLUCOSE 215* 187*  BUN 35* 34*  CREATININE 1.00 0.97  CALCIUM 8.6* 8.3*    PT/INR: No results for input(s): "LABPROT", "INR" in the last 72 hours. ABG    Component Value Date/Time   PHART 7.479 (H) 09/20/2022 0830   HCO3 25.2 09/20/2022 0830   TCO2 26 09/20/2022 0830   ACIDBASEDEF 1.0 09/18/2022 1629   O2SAT 94 09/20/2022 0830   CBG (last 3)  Recent Labs    09/28/22 2314 09/29/22 0304 09/29/22 0736  GLUCAP 150* 143* 163*    Assessment/Plan: S/P Procedure(s) (LRB): REPAIR OF ACUTE ASCENDING THORACIC AORTIC DISSECTION USING 28 MM HEMASHIELD PLATINUM VASCULAR GRAFT (N/A) - NEURO- R MCA infarcts with left hemiparesis-   Moving left side more but still significant  deficit  Continue PT/OT  For MBS today Postop atrial fibrillation on amiodarone and lopressor  Change amiodarone to per tube Hypertension- still hypertensive on Lopressor and Cozaar  HR is better will increase metoprolol to 25 BID RESP- CXR shows atelectasis, but minimal O2 requirement ID_ WBC still climbing with no obvious source. No fever. Late for steroids.  UA- negative  Will start Maxipime for presumed HCAP RENAL- creatinine normal  Hypernatremia- continue free water GI/ Nutrition- Protein calorie malnutrition- tolerating TF Deconditioning   LOS: 12 days    Loreli Slot 09/29/2022

## 2022-09-29 NOTE — Consult Note (Signed)
Modified Barium Swallow Progress Note  Patient Details  Name: James Estrada MRN: [id number] Date of Birth: 10-30-1941  Today's Date: 09/29/2022  Modified Barium Swallow completed.  Full report located under Chart Review in the Imaging Section.  Brief recommendations include the following:  Clinical Impression  Pt presents with mild oropharyngeal dysphagia primarily d/t impaired cognition impacting swallow initiation, mastication with pills/regular consistency with prolonged oral manipulation/propulsion.  Compensatory strategies and volume control assisting with propulsion of bolus; piecemeal swallowing noted with regular consistencies and PAS 2 (entered airway and ejected out) noted with mixed consistency during study.  Pt exhibited mild vallecular/pyriform residue with mixed consistency with subsequent swallow once initiated cleared both cavities to insignificant amount.  No aspiration noted throughout study, but pt is at risk d/t cognitive status.  Recommend initiating a Dysphagia 2(minced) diet with thin liquids given small amounts and FULL supervision during meals/snacks.   Medications crushed in puree d/t pt masticating barium tablet during study prior to swallowing and inattention impacted overall swallow function.  ST will f/u for diet tolerance and progression of diet/cognitive tx during acute stay.   Swallow Evaluation Recommendations       SLP Diet Recommendations: Dysphagia 2 (Fine chop) solids;Thin liquid   Liquid Administration via: Cup;Straw;Other (Comment) (small sips)   Medication Administration: Crushed with puree   Supervision: Staff to assist with self feeding;Full supervision/cueing for compensatory strategies;Full assist for feeding   Compensations: Slow rate;Small sips/bites;Minimize environmental distractions;Multiple dry swallows after each bite/sip   Postural Changes: Seated upright at 90 degrees   Oral Care Recommendations: Oral care BID;Staff/trained  caregiver to provide oral care        Tressie Stalker, M.S., CCC-SLP 09/29/2022,2:30 PM

## 2022-09-29 NOTE — Progress Notes (Signed)
  Inpatient Rehabilitation Admissions Coordinator   I await medical readiness to admit to CIR. We can take patients with cortrak. Noted for MBS today.  Ottie Glazier, RN, MSN Rehab Admissions Coordinator [phone] 09/29/2022 12:46 PM

## 2022-09-29 NOTE — Progress Notes (Signed)
Physical Therapy Treatment Patient Details Name: James Estrada MRN: [id number] DOB: 03/09/1941 Today's Date: 09/29/2022   History of Present Illness Pt is a 81 y/o M admitted to [facility] on 09/17/22 with BLE weakness, chest + back pain, admitted with CT angio shows type dissection with thrombosed false lumen. Repair of type 1 aortic dissection performed 11/4. Head CT 11/6 acute infarcts within R MCA involvement, including R cerebellum, basal ganglia, frontal lobe. ETT 11/4-11/9. PMH AAA s/p endovascular repair (2020), HTN, HLD, GERD.    PT Comments    Pt is making gradual progress towards his PT goals. Focused session on encouraging L attention through provided multi-modal cues to track or attend to that side, including using a mirror in front of him while sitting EOB. However, pt's lack of ability to maintain attention > 5 sec along with his apraxia impact his ability to follow commands to attend to his L limbs in the mirror or find numbers written on the mirror. Shifted focus then to gait training with pt requiring modAx2 to transfer to stand. An ACE wrap applied to cross the posterior aspect of his knee was successful in preventing L knee hyperextension today, but he continues to display L knee buckling. He did demonstrate L quads initiation and improved knee stability in standing intermittently though. He is currently unable to sequence/motor plan how to take steps, even with cues being provided by therapist or by family member this session, therefore pt is requiring maxAx2 for stand pivot or attempts to step along EOB. Will continue to follow acutely. Current recommendations remain appropriate.     Recommendations for follow up therapy are one component of a multi-disciplinary discharge planning process, led by the attending physician.  Recommendations may be updated based on patient status, additional functional criteria and insurance authorization.  Follow Up Recommendations  Acute inpatient  rehab (3hours/day)     Assistance Recommended at Discharge Frequent or constant Supervision/Assistance  Patient can return home with the following Two people to help with walking and/or transfers;Two people to help with bathing/dressing/bathroom;Assistance with cooking/housework;Assistance with feeding;Assist for transportation;Help with stairs or ramp for entrance   Equipment Recommendations  Other (comment) (TBD)    Recommendations for Other Services       Precautions / Restrictions Precautions Precautions: Fall;Other (comment);Sternal Precaution Booklet Issued: No Precaution Comments: Cortrak (pulled it prior to session 11/16), fecal incontinence, L inattention Restrictions Weight Bearing Restrictions: No     Mobility  Bed Mobility Overal bed mobility: Needs Assistance Bed Mobility: Rolling, Sidelying to Sit, Sit to Supine Rolling: Max assist Sidelying to sit: Max assist, +2 for safety/equipment, +2 for physical assistance, HOB elevated   Sit to supine: Max assist, +2 for physical assistance, +2 for safety/equipment, HOB elevated   General bed mobility comments: Cues provided to reach for rails to roll either direction, needing assistance to flex either knee and direct pt to rotate head and body to roll, maxA with poor motor planning/initiation by pt. MaxAx2 bring legs off EOB and to ascend trunk, with pt pushing through R UE slightly and kicking R leg off EOB when cued to sit up R EOB. MaxAx2 to direct trunk and lift legs back onto bed surface with return to supine.    Transfers Overall transfer level: Needs assistance Equipment used: Rolling walker (2 wheels), 2 person hand held assist Transfers: Sit to/from Stand Sit to Stand: Mod assist, +2 physical assistance, +2 safety/equipment Stand pivot transfers: Max assist, +2 physical assistance, +2 safety/equipment Step pivot transfers: Max  assist, +2 physical assistance, +2 safety/equipment       General transfer comment:  ACE wrap applied to L knee to prevent hyperextension, success noted. ModAx2 to steady pt and assist pt to power up to stand with cues at buttocks to extend hips and L knee block provided. Pt coming to stand 2x from EOB, 1x to RW and 1x to bil HHA. More difficulty noted with HHA, but unsure if due to pt have BM and therefore resulting in him leaning more posteriorly. MaxAx2 to shift wt bil and advance each leg, providing L knee block during stance to step to R to [facility]. Only able to take 1--2 steps each then transitioned to a pivot to bring buttocks closer to [facility] before sitting.    Ambulation/Gait Ambulation/Gait assistance: Max assist, +2 physical assistance, +2 safety/equipment     Gait Pattern/deviations: Step-to pattern, Decreased stride length, Decreased weight shift to right, Decreased weight shift to left, Knee flexed in stance - left, Knees buckling, Trunk flexed, Leaning posteriorly     Pre-gait activities: ACE wrap applied to L knee to prevent hyperextension, success noted. Practiced lateral weight shifting with noted L quads activation intermittently when shifting to L and VCs provided to stand up tall or straighten knees and less knee block provided. Practiced stepping anterior and posterior with L foot 1x, cuing and providing maxAx2 to weight shift to R and move L leg. Practiced stepping to R to [facility] 1-2 steps each foot, maxAx2 to weight shift, prevent posterior LOB, and manage L leg.     Stairs             Wheelchair Mobility    Modified Rankin (Stroke Patients Only) Modified Rankin (Stroke Patients Only) Pre-Morbid Rankin Score: No symptoms Modified Rankin: Severe disability     Balance Overall balance assessment: Needs assistance Sitting-balance support: Single extremity supported, Bilateral upper extremity supported, No upper extremity supported, Feet supported Sitting balance-Leahy Scale: Fair Sitting balance - Comments: Initially leaning to R and using UE support,  min guard-minA. Progressed to bil hands on lap and min guard with intermittent minA to prevent LOB Postural control: Right lateral lean, Posterior lean Standing balance support: Bilateral upper extremity supported, During functional activity Standing balance-Leahy Scale: Poor Standing balance comment: Pt standing x2 bouts of ~1.5-2.5 min durations each for gait training, mod-maxAx2 depending on extent of L lateral and posterior lean.                            Cognition Arousal/Alertness: Awake/alert Behavior During Therapy: Flat affect Overall Cognitive Status: Difficult to assess Area of Impairment: Following commands, Safety/judgement, Awareness, Problem solving, Attention, Memory                   Current Attention Level: Sustained Memory: Decreased short-term memory Following Commands: Follows one step commands inconsistently, Follows one step commands with increased time Safety/Judgement: Decreased awareness of safety, Decreased awareness of deficits Awareness: Intellectual Problem Solving: Slow processing, Decreased initiation, Difficulty sequencing, Requires verbal cues, Requires tactile cues General Comments: Cognition ultimately difficult to assess due to apparent aphasia. Able to state his first name repeatedly and the month of his birthday, but had difficulty with second step, like stating last name or exact date of birthday, needing prompting to eventually state his last name. L inattention, even when using occlusion glasses and mirror. Pt only following simple commands ~40% of time with delay. Pt maintains attention to task or therapist for  up to ~3-5 sec at a time. Perseverating on scooting forward on EOB. Crossed midline to track therapist 3x, but could not sustain > 1-2 sec. Pt with noted ideomotor and conceptual apraxia, having difficulty motor planning movements when cued and inability to comprehend how to use marker on mirror.        Exercises Other  Exercises Other Exercises: attempted to improve L attention/scanning through using mirror and cuing pt to attend to L limbs seen in mirror or find numbers written on mirror, but unsuccessful due to poor attention span    General Comments General comments (skin integrity, edema, etc.): VSS on RA; educated pt's wife and daughter on use of occlusion glasses, having pt track them successfully to the L, giving simple commands and giving pt enough time to process and respond      Pertinent Vitals/Pain Pain Assessment Pain Assessment: Faces Faces Pain Scale: Hurts a little bit Pain Location: generalized grimacing spontaneously throughout Pain Descriptors / Indicators: Grimacing Pain Intervention(s): Monitored during session, Limited activity within patient's tolerance, Repositioned    Home Living                          Prior Function            PT Goals (current goals can now be found in the care plan section) Acute Rehab PT Goals Patient Stated Goal: did not state, but family desires for pt to improve PT Goal Formulation: With patient/family Time For Goal Achievement: 10/07/22 Potential to Achieve Goals: Fair Progress towards PT goals: Progressing toward goals    Frequency    Min 4X/week      PT Plan Current plan remains appropriate    Co-evaluation              AM-PAC PT "6 Clicks" Mobility   Outcome Measure  Help needed turning from your back to your side while in a flat bed without using bedrails?: A Lot Help needed moving from lying on your back to sitting on the side of a flat bed without using bedrails?: Total Help needed moving to and from a bed to a chair (including a wheelchair)?: Total Help needed standing up from a chair using your arms (e.g., wheelchair or bedside chair)?: Total Help needed to walk in hospital room?: Total Help needed climbing 3-5 steps with a railing? : Total 6 Click Score: 7    End of Session Equipment Utilized During  Treatment: Gait belt;Other (comment) (ACE wrap L knee, mirror) Activity Tolerance: Patient tolerated treatment well Patient left: in bed;with call bell/phone within reach;with bed alarm set;with SCD's reapplied Nurse Communication: Mobility status PT Visit Diagnosis: Other abnormalities of gait and mobility (R26.89);Muscle weakness (generalized) (M62.81);Other symptoms and signs involving the nervous system (R29.898);Unsteadiness on feet (R26.81);Difficulty in walking, not elsewhere classified (R26.2);Hemiplegia and hemiparesis Hemiplegia - Right/Left: Left Hemiplegia - dominant/non-dominant: Dominant Hemiplegia - caused by: Cerebral infarction     Time: 1126-1205 PT Time Calculation (min) (ACUTE ONLY): 39 min  Charges:  $Gait Training: 8-22 mins $Therapeutic Activity: 8-22 mins $Neuromuscular Re-education: 8-22 mins                     Moishe Spice, PT, DPT Acute Rehabilitation Services  Office: [place] 09/29/2022, 12:47 PM

## 2022-09-29 NOTE — Progress Notes (Addendum)
Advanced Heart Failure Rounding Note  PCP-Cardiologist: None   Subjective:    Remains in NSR this morning.   WBCs continue to trend up, 20>>26K. AF. UA negative. CXR 11/14 w/ bibasilar atelectasis vs infiltrate. PCT pending.   C/w tube feeds. SLP following. Planning modified barium swallow study today.   Na 150>>148.   BPs remain elevated, 150s systolic.   Awake and responsive. No distress.   Objective:   Weight Range: 84.3 kg Body mass index is 25.21 kg/m.   Vital Signs:   Temp:  [97.7 F (36.5 C)-98.4 F (36.9 C)] 98.3 F (36.8 C) (11/16 0305) Pulse Rate:  [58-71] 69 (11/16 0700) Resp:  [17-24] 21 (11/16 0700) BP: (139-164)/(69-111) 158/71 (11/16 0700) SpO2:  [93 %-96 %] 95 % (11/16 0700) Weight:  [84.3 kg] 84.3 kg (11/16 0454) Last BM Date : 09/28/22  Weight change: Filed Weights   09/27/22 0500 09/28/22 0500 09/29/22 0454  Weight: 81.8 kg 83.5 kg 84.3 kg    Intake/Output:   Intake/Output Summary (Last 24 hours) at 09/29/2022 0730 Last data filed at 09/29/2022 0600 Gross per 24 hour  Intake 1663.96 ml  Output 850 ml  Net 813.96 ml      Physical Exam   General:  fatigued appearing elderly male. No resp difficulty HEENT: normal + cor-trak Neck: supple.  JVD not elevated. Carotids 2+ bilat; no bruits. No lymphadenopathy or thryomegaly appreciated. Cor: PMI nondisplaced. Regular rate & rhythm. No rubs, gallops or murmurs. Lungs: decreased BS at the bases bilaterally  Abdomen: soft, nontender, nondistended. No hepatosplenomegaly. No bruits or masses. Good bowel sounds. Extremities: no cyanosis, clubbing, rash, no edema + SCDs  Neuro: alert intermittently follows command. Weak on left    Telemetry   NSR 70s Personally reviewed   Labs    CBC Recent Labs    09/28/22 0500 09/29/22 0504  WBC 20.4* 25.7*  HGB 9.0* 8.9*  HCT 28.6* 28.6*  MCV 92.6 92.3  PLT 281 296   Basic Metabolic Panel Recent Labs    11/91/47 1141 09/29/22 0504   NA 150* 148*  K 4.0 3.6  CL 118* 114*  CO2 26 24  GLUCOSE 215* 187*  BUN 35* 34*  CREATININE 1.00 0.97  CALCIUM 8.6* 8.3*   Liver Function Tests Recent Labs    09/28/22 1141  AST 29  ALT 25  ALKPHOS 62  BILITOT 0.6  PROT 5.5*  ALBUMIN 2.4*   No results for input(s): "LIPASE", "AMYLASE" in the last 72 hours. Cardiac Enzymes No results for input(s): "CKTOTAL", "CKMB", "CKMBINDEX", "TROPONINI" in the last 72 hours.  BNP: BNP (last 3 results) No results for input(s): "BNP" in the last 8760 hours.  ProBNP (last 3 results) No results for input(s): "PROBNP" in the last 8760 hours.   D-Dimer No results for input(s): "DDIMER" in the last 72 hours.  Hemoglobin A1C No results for input(s): "HGBA1C" in the last 72 hours.  Fasting Lipid Panel No results for input(s): "CHOL", "HDL", "LDLCALC", "TRIG", "CHOLHDL", "LDLDIRECT" in the last 72 hours.  Thyroid Function Tests No results for input(s): "TSH", "T4TOTAL", "T3FREE", "THYROIDAB" in the last 72 hours.  Invalid input(s): "FREET3"  Other results:   Imaging    No results found.   Medications:     Scheduled Medications:  aspirin  81 mg Per Tube Daily   Chlorhexidine Gluconate Cloth  6 each Topical Daily   feeding supplement (PROSource TF20)  60 mL Per Tube Daily   fiber  1 packet Per  Tube BID   free water  200 mL Per Tube Q4H   insulin aspart  0-24 Units Subcutaneous Q4H   losartan  50 mg Oral Daily   metoprolol tartrate  12.5 mg Oral BID   Or   metoprolol tartrate  12.5 mg Per Tube BID   sodium chloride flush  10-40 mL Intracatheter Q12H   sodium chloride flush  10-40 mL Intracatheter Q12H   sodium chloride flush  3 mL Intravenous Q12H    Infusions:  sodium chloride     sodium chloride Stopped (09/24/22 1043)   amiodarone 30 mg/hr (09/29/22 0600)   feeding supplement (PEPTAMEN 1.5 CAL) 55 mL/hr at 09/29/22 0600   lactated ringers     lactated ringers Stopped (09/20/22 1403)    PRN  Medications: sodium chloride, levalbuterol, metoprolol tartrate, ondansetron (ZOFRAN) IV, mouth rinse, mouth rinse, sodium chloride flush, sodium chloride flush, sodium chloride flush, traMADol    Patient Profile   81 y/o male w/ h/o HTN, HLD, AAA s/p stent graft admitted w/ Type 1 Aortic Dissection, s/p emergent repair.   Assessment/Plan   1. Type 1 Aortic Dissection  - s/p emergent repair 11/4 - management per CT surgery    2. Hypertension  - SBPs persistently 150-160.  - Continue losartan 50 daily - Wt trending up, add spiro 12.5 mg daily  - Continue Metoprolol. May need switch to Coreg    3. HFpEF - Intra-op TEE EF 60-65%, RV ok - volume status ok off diuretics  4. ABLA/ Thrombocytopenia  - dissection w/ RBP + expected blood loss in OR - required multiple units of PRBC's, Plts, FFP, and Cryoprecipitate in OR - Hgb stable   5. Post-Op PAF - had recurrent AF on 11/14. Now back in NSR on IV amio  - wil continue IV amio load Then switch back to amio 200 bid - no AC with hemorrhagic component of stroke   6. Pericardial Effusion  - mod by Intra-op TEE, no tamponade - hemodynamics stable - repeat limited echo 11/14 minimal effusion    7. Acute Hypoxic Respiratory Failure - Extubated 11/9 to 4 liters [place].    8. Rt Sided Strokes   - CT head 11/6 showed 3 small right-sided strokes 1 in the cerebellum 1 in the right basal ganglia and 1 in the frontal lobe with a 4 mm area of bleeding  - MRI 11/11 stable - CTA - R carotid involved with dissection   9. AKI  - B/l Scr ~1.0>>spiked to 1.90  - suspect ATN from hemorrhagic shock/hypotension, now post resuscitation - Resolved  10. Hypokalemia - K 3.6 today. - supp PRN   11. Hypernatremia  - Na 148 - Continue FW  12. Leukocytosis  - WBC 20>>25K. AF. UA negative - CXR 11/14 w/ atelectasis vs PNA, check PCT   Insurance approved CIR   Length of Stay: [address] PA-C  09/29/2022, 7:30 AM  Advanced Heart  Failure Team Pager [phone] (M-F; 7a - 5p)  Please contact [facility] for night-coverage after hours (5p -7a ) and weekends on [email or web address]  Patient seen and examined with the above-signed Advanced Practice Provider and/or Housestaff. I personally reviewed laboratory data, imaging studies and relevant notes. I independently examined the patient and formulated the important aspects of the plan. I have edited the note to reflect any of my changes or salient points. I have personally discussed the plan with the patient and/or family.  Continues to be more interactive and follows commands.  Pulled out Cor-trak. Has repeat swallow study today. SBP remains elevated  Back in NSR on IV amio   WBC rising. PCT & UA negative  General:  Weak appearing. No resp difficulty HEENT: normal Neck: supple. no JVD. Carotids 2+ bilat; no bruits. No lymphadenopathy or thryomegaly appreciated. Cor: PMI nondisplaced. Regular rate & rhythm. No rubs, gallops or murmurs. Lungs: clear Abdomen: soft, nontender, nondistended. No hepatosplenomegaly. No bruits or masses. Good bowel sounds. Extremities: no cyanosis, clubbing, rash, edema Neuro: alert following some commands L side weak  Continues to improve. Maintaining NSR. Switch to po amio once Cor-trak back in.   BP up. Add spiro.   Suspect leukocytosis related to atx. Continue IS. Remove PICC.   Continue PT/OT  Arvilla Meres, MD  5:17 PM

## 2022-09-30 ENCOUNTER — Inpatient Hospital Stay (HOSPITAL_COMMUNITY): Payer: Medicare Other

## 2022-09-30 DIAGNOSIS — A419 Sepsis, unspecified organism: Secondary | ICD-10-CM | POA: Diagnosis not present

## 2022-09-30 DIAGNOSIS — R6520 Severe sepsis without septic shock: Secondary | ICD-10-CM | POA: Diagnosis not present

## 2022-09-30 DIAGNOSIS — Z9889 Other specified postprocedural states: Secondary | ICD-10-CM | POA: Diagnosis not present

## 2022-09-30 DIAGNOSIS — N179 Acute kidney failure, unspecified: Secondary | ICD-10-CM

## 2022-09-30 LAB — BASIC METABOLIC PANEL
Anion gap: 17 — ABNORMAL HIGH (ref 5–15)
Anion gap: 13 (ref 5–15)
BUN: 81 mg/dL — ABNORMAL HIGH (ref 8–23)
BUN: 94 mg/dL — ABNORMAL HIGH (ref 8–23)
CO2: 17 mmol/L — ABNORMAL LOW (ref 22–32)
CO2: 17 mmol/L — ABNORMAL LOW (ref 22–32)
Calcium: 8.6 mg/dL — ABNORMAL LOW (ref 8.9–10.3)
Calcium: 8.1 mg/dL — ABNORMAL LOW (ref 8.9–10.3)
Chloride: 110 mmol/L (ref 98–111)
Chloride: 107 mmol/L (ref 98–111)
Creatinine, Ser: 3.19 mg/dL — ABNORMAL HIGH (ref 0.61–1.24)
Creatinine, Ser: 3.88 mg/dL — ABNORMAL HIGH (ref 0.61–1.24)
GFR, Estimated: 19 mL/min — ABNORMAL LOW (ref >60)
GFR, Estimated: 15 mL/min — ABNORMAL LOW (ref >60)
Glucose, Bld: 155 mg/dL — ABNORMAL HIGH (ref 70–99)
Glucose, Bld: 142 mg/dL — ABNORMAL HIGH (ref 70–99)
Potassium: 5.5 mmol/L — ABNORMAL HIGH (ref 3.5–5.1)
Potassium: 5.6 mmol/L — ABNORMAL HIGH (ref 3.5–5.1)
Sodium: 144 mmol/L (ref 135–145)
Sodium: 137 mmol/L (ref 135–145)

## 2022-09-30 LAB — CBC
HCT: 31.1 % — ABNORMAL LOW (ref 39.0–52.0)
HCT: 29.5 % — ABNORMAL LOW (ref 39.0–52.0)
Hemoglobin: 9.4 g/dL — ABNORMAL LOW (ref 13.0–17.0)
Hemoglobin: 9.2 g/dL — ABNORMAL LOW (ref 13.0–17.0)
MCH: 28.1 pg (ref 26.0–34.0)
MCH: 28.7 pg (ref 26.0–34.0)
MCHC: 30.2 g/dL (ref 30.0–36.0)
MCHC: 31.2 g/dL (ref 30.0–36.0)
MCV: 93.1 fL (ref 80.0–100.0)
MCV: 91.9 fL (ref 80.0–100.0)
Platelets: 363 10*3/uL (ref 150–400)
Platelets: 274 10*3/uL (ref 150–400)
RBC: 3.34 MIL/uL — ABNORMAL LOW (ref 4.22–5.81)
RBC: 3.21 MIL/uL — ABNORMAL LOW (ref 4.22–5.81)
RDW: 15.8 % — ABNORMAL HIGH (ref 11.5–15.5)
RDW: 15.7 % — ABNORMAL HIGH (ref 11.5–15.5)
WBC: 51.8 10*3/uL (ref 4.0–10.5)
WBC: 42.5 10*3/uL — ABNORMAL HIGH (ref 4.0–10.5)
nRBC: 0 % (ref 0.0–0.2)
nRBC: 0 % (ref 0.0–0.2)

## 2022-09-30 LAB — GLUCOSE, CAPILLARY
Glucose-Capillary: 129 mg/dL — ABNORMAL HIGH (ref 70–99)
Glucose-Capillary: 140 mg/dL — ABNORMAL HIGH (ref 70–99)
Glucose-Capillary: 164 mg/dL — ABNORMAL HIGH (ref 70–99)
Glucose-Capillary: 126 mg/dL — ABNORMAL HIGH (ref 70–99)
Glucose-Capillary: 114 mg/dL — ABNORMAL HIGH (ref 70–99)

## 2022-09-30 LAB — LACTIC ACID, PLASMA
Lactic Acid, Venous: 1.4 mmol/L (ref 0.5–1.9)
Lactic Acid, Venous: 2 mmol/L (ref 0.5–1.9)

## 2022-09-30 MED ORDER — AMIODARONE HCL 200 MG PO TABS
400.0000 mg | ORAL_TABLET | Freq: Two times a day (BID) | ORAL | Status: DC
  Administered 2022-09-30 – 2022-10-02 (×6): 400 mg via ORAL
  Filled 2022-09-30 (×6): qty 2

## 2022-09-30 MED ORDER — SODIUM CHLORIDE 0.9 % IV SOLN: 250.0000 mL | INTRAVENOUS | Status: DC | PRN

## 2022-09-30 MED ORDER — SODIUM CHLORIDE 0.9% FLUSH: 3.0000 mL | INTRAVENOUS | Status: DC | PRN

## 2022-09-30 MED ORDER — ~~LOC~~ CARDIAC SURGERY, PATIENT & FAMILY EDUCATION: Freq: Once | Status: DC

## 2022-09-30 MED ORDER — SODIUM ZIRCONIUM CYCLOSILICATE 10 G PO PACK
10.0000 g | PACK | Freq: Once | ORAL | Status: AC
  Administered 2022-09-30: 10 g via ORAL
  Filled 2022-09-30: qty 1

## 2022-09-30 MED ORDER — ATORVASTATIN CALCIUM 40 MG PO TABS
40.0000 mg | ORAL_TABLET | Freq: Every day | ORAL | Status: DC
  Administered 2022-09-30 – 2022-10-04 (×5): 40 mg via ORAL
  Filled 2022-09-30 (×5): qty 1

## 2022-09-30 MED ORDER — VANCOMYCIN VARIABLE DOSE PER UNSTABLE RENAL FUNCTION (PHARMACIST DOSING): Status: DC

## 2022-09-30 MED ORDER — INSULIN ASPART 100 UNIT/ML IJ SOLN
0.0000 [IU] | Freq: Three times a day (TID) | INTRAMUSCULAR | Status: DC
  Administered 2022-09-30: 3 [IU] via SUBCUTANEOUS
  Administered 2022-09-30 – 2022-10-03 (×2): 2 [IU] via SUBCUTANEOUS

## 2022-09-30 MED ORDER — SODIUM CHLORIDE 0.9% FLUSH
3.0000 mL | Freq: Two times a day (BID) | INTRAVENOUS | Status: DC
  Administered 2022-09-30 – 2022-10-02 (×3): 3 mL via INTRAVENOUS

## 2022-09-30 MED ORDER — ENSURE ENLIVE PO LIQD
237.0000 mL | Freq: Two times a day (BID) | ORAL | Status: DC
  Administered 2022-09-30: 237 mL via ORAL

## 2022-09-30 MED ORDER — ENSURE ENLIVE PO LIQD
237.0000 mL | Freq: Two times a day (BID) | ORAL | Status: DC
  Administered 2022-09-30 – 2022-10-04 (×8): 237 mL via ORAL

## 2022-09-30 MED ORDER — FIDAXOMICIN 200 MG PO TABS
200.0000 mg | ORAL_TABLET | Freq: Two times a day (BID) | ORAL | Status: DC
  Administered 2022-09-30 – 2022-10-01 (×3): 200 mg via ORAL
  Filled 2022-09-30 (×4): qty 1

## 2022-09-30 MED ORDER — PIPERACILLIN-TAZOBACTAM IN DEX 2-0.25 GM/50ML IV SOLN
2.2500 g | Freq: Four times a day (QID) | INTRAVENOUS | Status: DC
  Administered 2022-09-30 – 2022-10-01 (×2): 2.25 g via INTRAVENOUS
  Filled 2022-09-30 (×4): qty 50

## 2022-09-30 MED ORDER — VANCOMYCIN HCL 1500 MG/300ML IV SOLN
1500.0000 mg | Freq: Once | INTRAVENOUS | Status: AC
  Administered 2022-09-30: 1500 mg via INTRAVENOUS
  Filled 2022-09-30: qty 300

## 2022-09-30 MED ORDER — INSULIN ASPART 100 UNIT/ML IJ SOLN: 0.0000 [IU] | Freq: Every day | INTRAMUSCULAR | Status: DC

## 2022-09-30 NOTE — Progress Notes (Signed)
Inpatient Rehab Admissions Coordinator:   Spoke with Carollee Herter at  [facility] to let her know patient has not been admitted and will likely not admit until Monday the earliest.  Rehab Admissons Coordinator [place], PT, GCS [phone]

## 2022-09-30 NOTE — Discharge Instructions (Signed)

## 2022-09-30 NOTE — Progress Notes (Addendum)
Advanced Heart Failure Rounding Note  PCP-Cardiologist: None   Subjective:    Maintaining NSR. BP much improved.   Passed swallow study. RN reports he has been eating most of his food.    No CP or dyspnea.   Pt approved for CIR.   Objective:   Weight Range: 87.4 kg Body mass index is 26.13 kg/m.   Vital Signs:   Temp:  [97.9 F (36.6 C)-98.8 F (37.1 C)] 97.9 F (36.6 C) (11/17 0326) Pulse Rate:  [62-86] 74 (11/17 0600) Resp:  [21-36] 27 (11/17 0600) BP: (116-158)/(60-120) 118/72 (11/17 0600) SpO2:  [92 %-100 %] 94 % (11/17 0600) Weight:  [87.4 kg] 87.4 kg (11/17 0416) Last BM Date : 09/29/22  Weight change: Filed Weights   09/28/22 0500 09/29/22 0454 09/30/22 0416  Weight: 83.5 kg 84.3 kg 87.4 kg    Intake/Output:   Intake/Output Summary (Last 24 hours) at 09/30/2022 0715 Last data filed at 09/30/2022 0300 Gross per 24 hour  Intake 1346.42 ml  Output 350 ml  Net 996.42 ml      Physical Exam   General:  Well appearing elderly male. No respiratory difficulty HEENT: normal Neck: supple. no JVD. Carotids 2+ bilat; no bruits. No lymphadenopathy or thyromegaly appreciated. Cor: PMI nondisplaced. Regular rate & rhythm. No rubs, gallops or murmurs. Lungs: decreased BS at the bases bilaterally. No wheezing  Abdomen: soft, nontender, nondistended. No hepatosplenomegaly. No bruits or masses. Good bowel sounds. Extremities: no cyanosis, clubbing, rash, edema + SCDS Neuro: alert & oriented. Weak on left. Affect pleasant.   Telemetry   NSR 70s Personally reviewed   Labs    CBC Recent Labs    09/28/22 0500 09/29/22 0504  WBC 20.4* 25.7*  HGB 9.0* 8.9*  HCT 28.6* 28.6*  MCV 92.6 92.3  PLT 281 296   Basic Metabolic Panel Recent Labs    97/02/63 1141 09/29/22 0504  NA 150* 148*  K 4.0 3.6  CL 118* 114*  CO2 26 24  GLUCOSE 215* 187*  BUN 35* 34*  CREATININE 1.00 0.97  CALCIUM 8.6* 8.3*   Liver Function Tests Recent Labs     09/28/22 1141  AST 29  ALT 25  ALKPHOS 62  BILITOT 0.6  PROT 5.5*  ALBUMIN 2.4*   No results for input(s): "LIPASE", "AMYLASE" in the last 72 hours. Cardiac Enzymes No results for input(s): "CKTOTAL", "CKMB", "CKMBINDEX", "TROPONINI" in the last 72 hours.  BNP: BNP (last 3 results) No results for input(s): "BNP" in the last 8760 hours.  ProBNP (last 3 results) No results for input(s): "PROBNP" in the last 8760 hours.   D-Dimer No results for input(s): "DDIMER" in the last 72 hours.  Hemoglobin A1C No results for input(s): "HGBA1C" in the last 72 hours.  Fasting Lipid Panel No results for input(s): "CHOL", "HDL", "LDLCALC", "TRIG", "CHOLHDL", "LDLDIRECT" in the last 72 hours.  Thyroid Function Tests No results for input(s): "TSH", "T4TOTAL", "T3FREE", "THYROIDAB" in the last 72 hours.  Invalid input(s): "FREET3"  Other results:   Imaging    DG Swallowing Func-Speech Pathology  Result Date: 09/29/2022 Table formatting from the original result was not included. Objective Swallowing Evaluation: Type of Study: MBS-Modified Barium Swallow Study  Patient Details Name: SALVATOR MUHL MRN: [id number] Date of Birth: 09/28/1941 Today's Date: 09/29/2022 Time: SLP Start Time (ACUTE ONLY): 1256 -SLP Stop Time (ACUTE ONLY): 1311 SLP Time Calculation (min) (ACUTE ONLY): 15 min Past Medical History: Past Medical History: Diagnosis Date  AAA (abdominal aortic  aneurysm) ([place])   last u/s done 07/18/17   Arthritis   Bradycardia   Dysrhythmia   frequent PAC, for 20 years  GERD (gastroesophageal reflux disease)   occ, OTC  Hemorrhoids   History of hiatal hernia   Hyperlipidemia   Hypertension   Seasonal allergies  Past Surgical History: Past Surgical History: Procedure Laterality Date  ABDOMINAL AORTIC ENDOVASCULAR STENT GRAFT N/A 05/04/2020  Procedure: ABDOMINAL AORTIC ENDOVASCULAR STENT GRAFT;  Surgeon: [name], MD;  Location: [facility];  Service: Vascular;  Laterality: N/A;  CATARACT  EXTRACTION Bilateral 2009  DIAGNOSTIC LAPAROSCOPY    laparoscopic hernia repair  EYE SURGERY Bilateral   cataract removal  HERNIA REPAIR Bilateral 1999, 2006  JOINT REPLACEMENT Right ~2018  hip replacement  pheochromocytoma  1993  PROSTATECTOMY N/A 05/15/2013  Procedure: PROSTATECTOMY RETROPUBIC; SIMPLE OPEN PROSTATECTOMY;  Surgeon: [name], MD;  Location: [facility];  Service: Urology;  Laterality: N/A;  REPAIR OF ACUTE ASCENDING THORACIC AORTIC DISSECTION N/A 09/17/2022  Procedure: REPAIR OF ACUTE ASCENDING THORACIC AORTIC DISSECTION USING 28 MM HEMASHIELD PLATINUM VASCULAR GRAFT;  Surgeon: [name], MD;  Location: [facility];  Service: Vascular;  Laterality: N/A;  Median sternotomy  TOTAL ELBOW REPLACEMENT Left   > 30 years ago  TOTAL HIP ARTHROPLASTY Right 09/08/2017  Procedure: RIGHT TOTAL HIP ARTHROPLASTY ANTERIOR APPROACH;  Surgeon: [name], MD;  Location: [facility];  Service: Orthopedics;  Laterality: Right;  ULTRASOUND GUIDANCE FOR VASCULAR ACCESS Bilateral 05/04/2020  Procedure: ULTRASOUND GUIDANCE FOR VASCULAR ACCESS;  Surgeon: [name], MD;  Location: [facility];  Service: Vascular;  Laterality: Bilateral; HPI: Pt is a 81 y/o M admitted to [facility] on 09/17/22 with type 1 aortic dissection requiring emergent repair. Head CT 11/6 acute infarcts within R MCA involvement, including R cerebellum, basal ganglia, frontal lobe. ETT 11/4-11/9. PMH AAA s/p endovascular repair (2020), HTN, HLD, GERD; BSE completed on 09/27/22 with recommendation for ice chips/sips of water, but NPO status prior to objective assessment being completed.  MBS generated.  Subjective: Pt stated DOB with mod cueing from SLP;Nursing informed SLP pt removed his Cortrak earlier this morning.  Recommendations for follow up therapy are one component of a multi-disciplinary discharge planning process, led by the attending physician.  Recommendations may be updated based on patient status, additional functional criteria and  insurance authorization. Assessment / Plan / Recommendation   09/29/2022   1:00 PM Clinical Impressions Clinical Impression Pt presents with mild oropharyngeal dysphagia primarily d/t impaired cognition impacting swallow initiation and mastication with pills/regular consistency with prolonged oral manipulation/propulsion and swallow initiation at valleculae/pyriform sinuses intermittently.  Compensatory strategies including repetitive swallows prn and and volume control assisting with propulsion of bolus; piecemeal swallowing viewed with regular consistencies and PAS 2 (entered airway and ejected out above level of vocal cords) observed with mixed consistency during study.  Pt exhibited mild vallecular/pyriform residue with mixed consistency with subsequent swallow once initiated cleared both cavities to insignificant amount.  No aspiration noted throughout study, but pt is at mild-mod risk d/t cognitive status if FULL precautions are not implemented during all PO intake.  Recommend initiating a conservative diet of Dysphagia 2(minced) with thin liquids given in small amounts and FULL supervision during all meals/snacks.   Medications crushed in puree d/t pt masticating barium tablet during study prior to swallowing and inattention impacting overall swallow function.  ST will f/u for diet tolerance and progression of diet/cognitive tx during acute stay. SLP Visit Diagnosis Dysphagia, oropharyngeal phase (R13.12) Impact  on safety and function Mild aspiration risk;Moderate aspiration risk     09/29/2022   1:00 PM Treatment Recommendations Treatment Recommendations Therapy as outlined in treatment plan below     09/29/2022   1:00 PM Prognosis Prognosis for Safe Diet Advancement Good Barriers to Reach Goals Cognitive deficits   09/29/2022   1:00 PM Diet Recommendations SLP Diet Recommendations Dysphagia 2 (Fine chop) solids;Thin liquid Liquid Administration via Cup;Straw;Other (Comment) Medication Administration Crushed  with puree Compensations Slow rate;Small sips/bites;Minimize environmental distractions;Multiple dry swallows after each bite/sip Postural Changes Seated upright at 90 degrees     09/29/2022   1:00 PM Other Recommendations Oral Care Recommendations Oral care BID;Staff/trained caregiver to provide oral care Follow Up Recommendations Acute inpatient rehab (3hours/day) Functional Status Assessment Patient has had a recent decline in their functional status and demonstrates the ability to make significant improvements in function in a reasonable and predictable amount of time.   09/29/2022   1:00 PM Frequency and Duration  Speech Therapy Frequency (ACUTE ONLY) min 2x/week Treatment Duration 1 week     09/29/2022   1:00 PM Oral Phase Oral Phase Impaired Oral - Thin Teaspoon Holding of bolus Oral - Thin Cup Holding of bolus Oral - Thin Straw Holding of bolus Oral - Puree Holding of bolus;Reduced posterior propulsion Oral - Mech Soft Reduced posterior propulsion;Holding of bolus Oral - Regular Reduced posterior propulsion;Holding of bolus Oral - Multi-Consistency Reduced posterior propulsion;Holding of bolus Oral - Pill Impaired mastication;Holding of bolus;Reduced posterior propulsion Oral Phase - Comment Oral holding noted overall with decreased oral propulsion with mech soft/regular consistencies    09/29/2022   1:00 PM Pharyngeal Phase Pharyngeal Phase Impaired Pharyngeal- Nectar Teaspoon NT Pharyngeal- Nectar Cup [facility] Pharyngeal Material does not enter airway Pharyngeal- Nectar Straw NT Pharyngeal- Thin Teaspoon Delayed swallow initiation-vallecula Pharyngeal Material does not enter airway Pharyngeal- Thin Cup Delayed swallow initiation-vallecula;Delayed swallow initiation-pyriform sinuses Pharyngeal Material does not enter airway Pharyngeal- Thin Straw Delayed swallow initiation-pyriform sinuses Pharyngeal Material does not enter airway Pharyngeal- Puree WFL Pharyngeal Material does not enter airway Pharyngeal-  Mechanical Soft Delayed swallow initiation-vallecula;Delayed swallow initiation-pyriform sinuses Pharyngeal Material does not enter airway Pharyngeal- Regular Delayed swallow initiation-vallecula;Delayed swallow initiation-pyriform sinuses;Pharyngeal residue - valleculae;Pharyngeal residue - pyriform Pharyngeal Material does not enter airway Pharyngeal- Multi-consistency Delayed swallow initiation-vallecula;Pharyngeal residue - valleculae Pharyngeal Material enters airway, remains ABOVE vocal cords then ejected out Pharyngeal- Pill Delayed swallow initiation-vallecula Pharyngeal Material does not enter airway Pharyngeal Comment Pt with delay to level of valleculae/pyriform sinuses with mechanical soft/regular consistencies and with multi-consistency, material entered airway above cords and ejected out    09/29/2022   1:00 PM Cervical Esophageal Phase  Cervical Esophageal Phase [facility] Elvina Sidle, M.S., CCC-SLP 09/29/2022, 2:23 PM                     DG Chest Port 1 View  Result Date: 09/29/2022 CLINICAL DATA:  Recent CABG.  Follow-up stroke.  Atelectasis. EXAM: PORTABLE CHEST 1 VIEW COMPARISON:  AP chest 09/27/2022, 09/23/2022, 05/04/2020 FINDINGS: Enteric tube again descends below the diaphragm with the tip excluded by collimation. Right upper extremity PICC tip overlies the superior vena cava/right atrial junction, unchanged. Status post median sternotomy. Cardiac silhouette and mediastinal contours are within normal limits. Improved aeration of the bilateral lung bases with persistent mild heterogeneous airspace opacities. Probable small left-greater-than-right pleural effusions, unchanged to mildly improved from prior. No pneumothorax. IMPRESSION: 1. Improved aeration of the bilateral lung bases with persistent mild heterogeneous airspace opacities that  may represent atelectasis versus pneumonia. 2. Probable small left-greater-than-right pleural effusions, unchanged to mildly improved from prior. Electronically  Signed   By: Yvonne Kendall M.D.   On: 09/29/2022 08:37     Medications:     Scheduled Medications:  amiodarone  400 mg Per Tube BID   aspirin  81 mg Per Tube Daily   atorvastatin  40 mg Per Tube Daily   Chlorhexidine Gluconate Cloth  6 each Topical Daily   feeding supplement (PROSource TF20)  60 mL Per Tube Daily   fiber  1 packet Per Tube BID   free water  200 mL Per Tube Q4H   insulin aspart  0-24 Units Subcutaneous Q4H   losartan  50 mg Oral Daily   metoprolol tartrate  25 mg Oral BID   Or   metoprolol tartrate  25 mg Per Tube BID   sodium chloride flush  10-40 mL Intracatheter Q12H   sodium chloride flush  10-40 mL Intracatheter Q12H   sodium chloride flush  3 mL Intravenous Q12H   spironolactone  12.5 mg Oral Daily    Infusions:  sodium chloride     sodium chloride Stopped (09/30/22 0253)   ceFEPime (MAXIPIME) IV Stopped (09/30/22 0125)   feeding supplement (PEPTAMEN 1.5 CAL) 55 mL/hr at 09/29/22 1800   lactated ringers     lactated ringers Stopped (09/20/22 1403)    PRN Medications: sodium chloride, levalbuterol, metoprolol tartrate, ondansetron (ZOFRAN) IV, mouth rinse, mouth rinse, sodium chloride flush, sodium chloride flush, sodium chloride flush, traMADol    Patient Profile   80 y/o male w/ h/o HTN, HLD, AAA s/p stent graft admitted w/ Type 1 Aortic Dissection, s/p emergent repair.   Assessment/Plan   1. Type 1 Aortic Dissection  - s/p emergent repair 11/4 - management per CT surgery    2. Hypertension  - better controlled - continue losartan 50 mg - continue spiro 12.5 mg  - continue Lopressor 25 mg bid    3. HFpEF - Intra-op TEE EF 60-65%, RV ok - volume status ok off diuretics  4. ABLA/ Thrombocytopenia  - dissection w/ RBP + expected blood loss in OR - required multiple units of PRBC's, Plts, FFP, and Cryoprecipitate in OR - Hgb stable   5. Post-Op PAF - had recurrent AF on 11/14. Now back in NSR on IV amio  - continue PO amio 400  bid - no AC with hemorrhagic component of stroke   6. Pericardial Effusion  - mod by Intra-op TEE, no tamponade - hemodynamics stable - repeat limited echo 11/14 w/ trivial effusion    7. Acute Hypoxic Respiratory Failure - Extubated 11/9  - stable    8. Rt Sided Strokes   - CT head 11/6 showed 3 small right-sided strokes 1 in the cerebellum 1 in the right basal ganglia and 1 in the frontal lobe with a 4 mm area of bleeding  - MRI last night stable - CTA - R carotid involved with dissection   9. AKI  - B/l Scr ~1.0>>spiked to 1.90  - suspect ATN from hemorrhagic shock/hypotension, now post resuscitation - Resolved  10. Hypokalemia - continue spiro  - check BMP   11. Hypernatremia  - Na 148 yesterday  - repeat BMP   CT surgery to d/c to CIR   Length of Stay: [address] PA-C  09/30/2022, 7:15 AM  Advanced Heart Failure Team Pager [phone] (M-F; 7a - 5p)  Please contact [facility] for night-coverage after hours (  5p -7a ) and weekends on [email or web address]  Patient seen and examined with the above-signed Advanced Practice Provider and/or Housestaff. I personally reviewed laboratory data, imaging studies and relevant notes. I independently examined the patient and formulated the important aspects of the plan. I have edited the note to reflect any of my changes or salient points. I have personally discussed the plan with the patient and/or family.  More interactive. Passed swallow study. Remains in NSR. BP improved. No CBC this am  General: Sitting up in bed. No resp difficulty HEENT: normal Neck: supple. no JVD. Carotids 2+ bilat; no bruits. No lymphadenopathy or thryomegaly appreciated. Cor: PMI nondisplaced. Regular rate & rhythm. No rubs, gallops or murmurs. Lungs: clear Abdomen: soft, nontender, nondistended. No hepatosplenomegaly. No bruits or masses. Good bowel sounds. Extremities: no cyanosis, clubbing, rash, edema Neuro: alert following intermittent  commands weak on left   Remains in NSR. BP improved. Passed swallowing study. Needs repeat labs today (suspect inaccurate)  If labs stable. Ok for d/c to CIR.   Arvilla Meres, MD  2:11 PM

## 2022-09-30 NOTE — Progress Notes (Signed)
NAME:  James Estrada, MRN:  [id number], DOB:  02-22-1941, LOS: 87 ADMISSION DATE:  09/17/2022, CONSULTATION DATE:  11/8 REFERRING MD:  Bensimhon, CHIEF COMPLAINT:  encephalpathy, respiratory failure   History of Present Illness:  James Estrada is a 81 y/o gentleman with a history of AAA, GERD, HLD, HTN who presented with acute onset of weakness and pain across his chest and upper back that caused him to collapse on 11/4.  In the ED a code stroke was called.  He was was found to have an acute ascending aortic dissection with thrombosis of the false lumen with extension into the brachiocephalic artery with narrowing and into the proximal carotid artery.  CT scan also noted left retroperitoneal hemorrhage from the iliics to the left kidney and decreased perfusion to the left kidney.  He underwent emergency dissection repair with vascular graft on 11/4.  He was found to have embolic strokes postoperatively.  He continues to require mechanical ventilation due to encephalopathy and agitation. At baseline he is very active, walking every day.  PCCM was consulted for assistance with ventilator weaning.  Pertinent  Medical History  AAA status post endovascular repair in 2020 Hypertension Hyperlipidemia GERD Cataract extraction bilaterally  Significant Hospital Events: Including procedures, antibiotic start and stop dates in addition to other pertinent events   11/4 Ascending aortic dissection, s/p graft repair on bypass 11/8 oral sedatives started, off precedex 11/7 small ICA strokes 11/9 extubated 11/17 transferred back to the intensive care unit  Interim History / Subjective:    Asked to see patient after being moved from intensive care unit.  White count increasing to 54,000.  Has diarrhea and worsening renal failure.   Objective   Blood pressure 121/69, pulse 73, temperature 98.4 F (36.9 C), temperature source Oral, resp. rate (!) 25, height 6' (1.829 m), weight 87.4 kg, SpO2 96 %.         Intake/Output Summary (Last 24 hours) at 09/30/2022 1720 Last data filed at 09/30/2022 1639 Gross per 24 hour  Intake 699.53 ml  Output 650 ml  Net 49.53 ml   Filed Weights   09/28/22 0500 09/29/22 0454 09/30/22 0416  Weight: 83.5 kg 84.3 kg 87.4 kg   Examination: General: Elderly male resting comfortably in bed HENT: NCAT, tracking appropriately Lungs: Diminished breath sounds bilaterally Cardiovascular: Regular rate rhythm, S1-S2 Abdomen: Mildly distended Extremities: No significant edema Neuro: Alert following commands GU: Deferred Derm: w no rash  Ancillary tests personally reviewed:  Serum creatinine 3.88  Assessment & Plan:   Sepsis Diarrhea Concern for C. difficile Leukocytosis, leukemoid reaction, white blood cell count 54,000 Plan: Transferred to the in intensive care unit 1 L lactated Ringer's Hold diuretics and blood pressure medications Start empiric antibiotics, Zosyn, vancomycin and Dificid C. difficile testing approved pending Enteric precautions  Acute ascending aortic dissection s/p graft repair Pericardial effusion -Postop care per T CTS Appears to be improving.  Postop paroxysmal atrial fibrillation Acute HFpEF Periodic hypertension with agitation - Holding diuresis Continue amiodarone Continue metoprolol 25 mg twice daily  Acute respiratory failure with hypoxia now extubated Mucus plugging R pleural effusion Plan: Holding diuresis Continue mobility, I-S and flutter  AKI, improving Left renal artery narrowing demonstrated on CT -Plan: Continue to follow urine output Follow electrolytes If kidney function continues to worsen will consider nephrology consultation  Embolic strokes complicating aortic dissection repair -appreciate stroke team's management-  Will need stroke rehabilitation PT OT SLP  Hyperglycemia now well controlled -Continue detemir 12 units twice daily  plus sliding scale insulin Goal blood glucose  140-180  Thrombocytopenia, consumptive vs due to critical illness and recent aortic surgery. Acute blood loss anemia -monitor Transfuse hemoglobin less than 7   Best Practice (right click and "Reselect all SmartList Selections" daily)   Diet/type: tubefeeds core track placed today DVT prophylaxis: SCD, start chemical prophylaxis once platelet count greater than 80 GI prophylaxis: H2B Lines: Central line, Arterial Line, and No longer needed.  Order written to d/c  Foley:  removal ordered  Code Status:  full code Last date of multidisciplinary goals of care discussion [full scope]   This patient is critically ill with multiple organ system failure; which, requires frequent high complexity decision making, assessment, support, evaluation, and titration of therapies. This was completed through the application of advanced monitoring technologies and extensive interpretation of multiple databases. During this encounter critical care time was devoted to patient care services described in this note for 36 minutes.  Josephine Igo, DO [facility] 09/30/2022 5:20 PM

## 2022-09-30 NOTE — Progress Notes (Signed)
Pharmacy Antibiotic Note  James Estrada is a 81 y.o. male admitted on 09/17/2022 with sepsis.  Pharmacy has been consulted for vancomycin and zosyn dosing.  Plan: Vancomycin 1500 mg IV x 1 now, check random level in the AM for subsequent dosing. Zosyn 2.25g IV q 6 hrs for now based on renal function. Monitor renal fx, cultures and clinical course.  Height: 6' (182.9 cm) Weight: 87.4 kg (192 lb 10.9 oz) IBW/kg (Calculated) : 77.6  Temp (24hrs), Avg:98.4 F (36.9 C), Min:97.9 F (36.6 C), Max:98.8 F (37.1 C)  Recent Labs  Lab 09/27/22 0530 09/28/22 0500 09/28/22 1141 09/29/22 0504 09/30/22 0855 09/30/22 1422  WBC 19.2* 20.4*  --  25.7* 51.8* 42.5*  CREATININE 1.07 0.94 1.00 0.97 3.19* 3.88*    Estimated Creatinine Clearance: 17.2 mL/min (A) (by C-G formula based on SCr of 3.88 mg/dL (H)).    No Known Allergies  Antimicrobials this admission: Cefepime 11/16 >> 11/17 Zosyn 11/17>> Vanc 11/17 >>  Dose adjustments this admission:  Microbiology results: 11/8 trach asp - enterobacter cloacaes ensitive to cefepime, bactrim, cipro, imipenem  Thank you for allowing pharmacy to be a part of this patient's care.  Reece Leader, Colon Flattery, BCCP Clinical Pharmacist  09/30/2022 4:22 PM   [facility] pharmacy phone numbers are listed on [email or web address]

## 2022-09-30 NOTE — Progress Notes (Addendum)
Was notified of abnormal labs results.   WBC spiked to 52K, SCr 0.97>>3.19. K 5.5   Verified x 2. Repeat WBC 43K, Scr 3.88, K 5.6   Pt awake/ alert. No distress. BP currently ok. Has had frequent diarrhea.  D/w CCM. Concern for C-diff  Check STAT KUB Order C-diff panel  Blood Cx   Start empiric abx   Give 1L LR bolus  Stop losartan and spiro w/ AKI/Hyperkalemia  Lolkelma 10 g x 1   Wilmar Prabhakar, PA-C

## 2022-09-30 NOTE — Progress Notes (Signed)
Nutrition Follow-up  DOCUMENTATION CODES:   Not applicable  INTERVENTION:   Magic cup TID with meals, each supplement provides 290 kcal and 9 grams of protein  Ensure Enlive po BID, each supplement provides 350 kcal and 20 grams of protein. Scheduled for AM and as evening snack  Feeding Assistance at meal times  NUTRITION DIAGNOSIS:   Inadequate oral intake related to acute illness as evidenced by NPO status.  Being addressed via diet, supplements  GOAL:   Patient will meet greater than or equal to 90% of their needs  Progressing  MONITOR:   PO intake, Supplement acceptance, Skin, Labs  REASON FOR ASSESSMENT:   Ventilator    ASSESSMENT:   81 yo male admitted with type 1 aortic dissection requiring emergent repair. PMH includes HTN, HLD, bradycardia, AAA s/p stent graft  11/04 Type 1 aortic dissection to OR for repair,  massive EBL requiring multiple transfusions 11/06 CT head with 3 small right strokes, 1 with 28mm area of bleeding, neurology consulted 11/09 Extubated  11/10 Cortrak placed, TF restarted 11/16 MBS with diet advanced Dysphagia 2, Thins 11/17 Cortrak out  Noted plan for CIR  Worsening renal function this AM, significant bmp in WBC. Labs redrawn to confirm  Creatinine jumped to 3.88 from 0.97 in 24 hours. BUN up to 94. WBC also went from 20s to 40-50s Afebrile  Diet advanced to Dysphagia 2, Thins yesterday after noon. Pt ate 75% at dinner meal last night. Eating lunch with assistance from Wife today, eating Magic Cup well,   TF orders discontinued by MD, Cortrak removed by pt   Noted mild hyperkalemia, lokelma ordered. Oliguric based on UOP but unsure of accuracy.  Noted pt has had some frequent diarrhea, none today.  Labs: potassium 5.6 (H), Creatinine 3.88, BUN 94, WBC 42.5 Meds: ss novolog   Diet Order:   Diet Order             DIET DYS 2 Room service appropriate? Yes with Assist; Fluid consistency: Thin  Diet effective now                    EDUCATION NEEDS:   Education needs have been addressed  Skin:  Skin Assessment: Skin Integrity Issues: Skin Integrity Issues:: Incisions, Other (Comment) Incisions: sternum, groin Other: laceration to lip  Last BM:  11/16 Type 6  Height:   Ht Readings from Last 1 Encounters:  09/18/22 6' (1.829 m)    Weight:   Wt Readings from Last 1 Encounters:  09/30/22 87.4 kg    BMI:  Body mass index is 26.13 kg/m.  Estimated Nutritional Needs:   Kcal:  2000-2200 kcals  Protein:  100-115 g  Fluid:  1.8 L   Romelle Starcher MS, RDN, LDN, CNSC Registered Dietitian 3 Clinical Nutrition RD Pager and On-Call Pager Number Located in [place]

## 2022-09-30 NOTE — Progress Notes (Signed)
Speech Language Pathology Treatment: Dysphagia  Patient Details Name: James Estrada MRN: [id number] DOB: [birth year] Today's Date: 09/30/2022 Time: 1607-3710 SLP Time Calculation (min) (ACUTE ONLY): 20 min  Assessment / Plan / Recommendation Clinical Impression  Pt seen for dysphagia therapy with wife present and feeding pt lunch. Yesterday's MBS results and recommendations reviewed and therapist able to provide verbal education and feedback/demonstration during session. Prolonged mastication and transit impacted by cognitive deficits requiring verbal cues and additional time for swallow onset consistent with MBS results. He did not follow cues to open mouth for oral inspection of oral cavity and independently used tongue to check/remove material from buccal cavity. Reflexive throat clears noted inconsistently however no episodes of cough or wet vocal quality. Majority of session consisted of feedback/education/recommendations for wife including observation of swallow onset, controlling rate, volume, positioning, swallow solids prior to giving sips (penetrated with mixed consistencies during MBS). Recommend continue Dys 2 texture, thin liquids, crush meds, full supervision and dysphagia, cognitive intervention.    HPI HPI: Pt is a 81 y/o M admitted to [facility] on 09/17/22 with type 1 aortic dissection requiring emergent repair. Head CT 11/6 acute infarcts within R MCA involvement, including R cerebellum, basal ganglia, frontal lobe. ETT 11/4-11/9. PMH AAA s/p endovascular repair (2020), HTN, HLD, GERD; BSE completed on 09/27/22 with recommendation for ice chips/sips of water, but NPO status prior to objective assessment being completed.  MBS generated.      SLP Plan  Continue with current plan of care      Recommendations for follow up therapy are one component of a multi-disciplinary discharge planning process, led by the attending physician.  Recommendations may be updated based on patient status,  additional functional criteria and insurance authorization.    Recommendations  Diet recommendations: Dysphagia 2 (fine chop);Thin liquid Liquids provided via: Straw;Cup Medication Administration: Crushed with puree Supervision: Staff to assist with self feeding Compensations: Minimize environmental distractions;Slow rate;Small sips/bites;Lingual sweep for clearance of pocketing Postural Changes and/or Swallow Maneuvers: Seated upright 90 degrees                Oral Care Recommendations: Oral care BID Follow Up Recommendations: Acute inpatient rehab (3hours/day) Assistance recommended at discharge: Frequent or constant Supervision/Assistance SLP Visit Diagnosis: Dysphagia, oropharyngeal phase (R13.12) Plan: Continue with current plan of care           Royce Macadamia  09/30/2022, 12:46 PM

## 2022-09-30 NOTE — Discharge Summary (Addendum)
[address]       [place] [address]             [phone]    Physician Discharge Summary  Patient ID: James Estrada MRN: [id number] DOB/AGE: Mar 24, 1941 81 y.o.  Admit date: 09/17/2022 Discharge date: 10/04/2022  Admission Diagnoses:  Patient Active Problem List   Diagnosis Date Noted   Status post surgery 09/17/2022   Aneurysm of left internal iliac artery ([place]) 05/22/2022   Colon cancer screening 05/22/2022   Essential hypertension 05/22/2022   Hemorrhoids 05/22/2022   Inguinal hernia 05/22/2022   Irritable bowel syndrome 05/22/2022   Mixed hyperlipidemia 05/22/2022   Pure hypercholesterolemia 05/22/2022   Thrombocytopenia ([place]) 05/22/2022   Allergic rhinitis 05/22/2022   AAA (abdominal aortic aneurysm) without rupture ([place]) 05/04/2020   Trochanteric bursitis, right hip 08/20/2018   History of right hip replacement 08/20/2018   Status post total replacement of right hip 09/08/2017   Pain of right hip joint 08/07/2017   Unilateral primary osteoarthritis, right hip 08/07/2017   BPH (benign prostatic hypertrophy) with urinary obstruction 05/15/2013   Discharge Diagnoses:  Patient Active Problem List   Diagnosis Date Noted   Sepsis with acute renal failure without septic shock ([place]) 09/30/2022   S/P aortic dissection repair 09/18/2022   Status post surgery 09/17/2022   Aneurysm of left internal iliac artery ([place]) 05/22/2022   Colon cancer screening 05/22/2022   Essential hypertension 05/22/2022   Hemorrhoids 05/22/2022   Inguinal hernia 05/22/2022   Irritable bowel syndrome 05/22/2022   Mixed hyperlipidemia 05/22/2022   Pure hypercholesterolemia 05/22/2022   Thrombocytopenia ([place]) 05/22/2022   Allergic rhinitis 05/22/2022   AAA (abdominal aortic aneurysm) without rupture ([place]) 05/04/2020   Trochanteric bursitis, right hip 08/20/2018   History of right hip replacement 08/20/2018   Status post total replacement of right hip 09/08/2017    Pain of right hip joint 08/07/2017   Unilateral primary osteoarthritis, right hip 08/07/2017   BPH (benign prostatic hypertrophy) with urinary obstruction 05/15/2013   Discharged Condition: good  History of Present Illness: 81 yo man with a history of hypertension, hyperlipidemia and AAA s/p stent graft.  Presented after collapse due to sudden bilateral weakness and chest and upper back pain.  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  Majority of the false lumen is thrombosed.  Also evidence of a retroperitoneal bleed.   Needs emergent repair of type 1 dissection.   I discussed the general nature of the procedure, including the need for general anesthesia, the use of cardiopulmonary bypass, the use of hypothermic circulatory arrest, the incisions to be used, and the use of drainage tubes postoperatively with [name] and his family. I informed them of the indications, risks, benefits and alternatives.  They understand the risks include, but are not limited to death, stroke, MI, DVT/PE, bleeding, possible need for transfusion, infections, cardiac arrhythmias as well as other organ system dysfunction including respiratory, renal, or GI complications.    He accept the risks and agrees to proceed.     BP was in 24Q systolic on right arm, when moved to left side was 130.   Hospital Course: [name] was prepared and taken emergently to the operating room where the type a aortic dissection was repaired utilizing a 30 mm straight Hemashield graft under deep hypothermic circulatory arrest.  He was transfused with multiple blood products perioperatively for expected and severe coagulopathy.  Following the procedure, he was transferred  to the surgical ICU in stable condition on milrinone, Neo-Synephrine, and amiodarone.  His cardiac rhythm and hemodynamics remained stable.  He was left on mechanical ventilation until the morning of the first postoperative day.  He was  weaned using standard protocols and extubated.  He was able to move all extremities and was following simple commands by the morning of the first postoperative day.  Unfortunately, patient was not able to follow commands equally on right and left side of his upper extremities.  He was unable to move his lower extremities.  Neurology consult was obtained and followed patient closely.  He developed hypertension and was started on Cardene. He was hypokalemic and supplemented accordingly.  The patient exhibited further reduction in movement of his left side.  Head CT scan was obtained and showed evidence of acute infarcts.  He was transfused additional units of packed cells for blood loss anemia.  He was volume overloaded and started on a lasix drip.  Advanced heart failure team was consulted and monitored patient closely.  The patient became hypotensive and Cardene was discontinued.  He required initiation of Neo-synephrine.  He had some Atrial Fibrillation and was treated with Amiodarone drip.  He converted to NSR.  The patient underwent EEG which did not show evidence of seizures.  The patient continued to show evidence of left hemiparesis.  He was weaned and extubated on 09/22/2022.  He was able to follow simple commands but he did provide verbal responses to questions.  He underwent Cortrak placement for nutrition.  He underwent MRI which confirmed numerous bilateral foci of acute ischemia R>L in MCA and PCA territories as well as cerebellum.  The patient was evaluated by PT/OT who recommended inpatient rehab.  He became more responsive and is following simple commands.  He was hypernatremic and treated with free water.  The patient's CXR showed improvement of previous right pleural effusion and LLL atelectasis.  The patient is more alert.  He underwent SLP evaluation who recommended Modified Barium Swallow.  This showed a mild to moderate aspiration risk.  He was started on a dysphagia 2 diet.   The patient  developed leukocytosis and was started on Maxipime for presumed HCAP.  The patient pulled out his feeding tube.  He was closely monitored for adequate oral intake.   The patient was maintaining NSR and was felt stable for transfer to the progressive care unit on 09/30/2022. He was later transferred back to the ICU on 11/17 as WBC increased to 54,000 (already on Vancomycin and Zosyn), worsening renal failure (creatinine increased to 3.88). He was found to have urinary retention. Foley was placed with about 2L removed. C dif was checked because of worsening diarrhea. Creatinine was decreased to 1.55 and WBC was decreased to 20,800 on 11/19. He was felt surgically stable for transfer back to the floor for further convalescence.  The patient continued to make progress.  He was in NSR and his Amiodarone was decreased to 200 mg BID.  He received a dose of IV lasix and diuresed 5L which resulted in a slight increase in his creatinine level to 1.81.  This improved without intervention, with level decreasing to 1.31.  He will be transitioned to an oral regimen of Augmentin for 10 days.  He has a foley in place, this can be removed in the next 24-48 hours.  He is on flomax.  His surgical incisions are healing without evidence of infection.  He is medically stable for discharge to CIR today.  Consults: pulmonary/intensive care, neurology, and AHF  Significant Diagnostic Studies:   CTA CHEST FINDINGS:   Cardiovascular: New from prior exam is an acute type A thoracic aortic dissection extending throughout the course of the ascending aorta. There is small foci of extraluminal contrast adjacent to the ascending aorta suggestive of active hemorrhage. Small hemopericardium. The false lumen appears thrombosed. The dissection involves the origin of the brachiocephalic artery which is severely narrowed. Dissection involves the origin of the left common carotid artery. The left subclavian artery arises from the true  lumen. The true lumen is greater than 50% narrowed involving the descending thoracic aorta. There is no central pulmonary embolus. Coronary artery calcifications are seen.   Mediastinum/Nodes: Stranding in the anterior mediastinum related to aortic dissection. There is no obvious bulky adenopathy. The esophagus is decompressed.   Lungs/Pleura: Hypoventilatory atelectasis dependently. No pleural fluid. Trachea and central airways are patent.   Musculoskeletal: There are no acute or suspicious osseous abnormalities.   Review of the MIP images confirms the above findings.   CTA ABDOMEN AND PELVIS FINDINGS   VASCULAR   Aorta: Endovascular repair prior abdominal aortic aneurysm. The stent graft is patent. There is increased size of the excluded aneurysm sac currently 4.5 cm, previously 3.5 cm. There are tiny foci of high density in the aneurysm sac series 7, image 204, that were not definitively seen on prior. The proximal aspect of the stent originates just below the renal arteries. The distal aspect of the stents in the common iliac arteries. There is moderate left retroperitoneal hemorrhage outside the aneurysm sac, but no definite extraluminal active bleeding. This retroperitoneal hemorrhage extends to the anterior aspect of the left psoas muscle as well as medial to the left kidney.   Celiac: Arises from the true lumen, no stenosis. Dissection does not involve the celiac vessels. Similar ectasia of the celiac artery at 12 mm.   SMA: Arises from the true lumen. No dissection involves the SMA. The distal branches are patent.   Renals: The right renal artery arises from the true lumen and is patent. There is narrowing at the origin of the left renal artery with diminished left renal perfusion. Stranding is seen adjacent to the proximal left renal artery which is irregular.   IMA: Excluded by the graft, with reconstitution distally.   Inflow: The right iliac limb terminates  in the common iliac artery. The diameter of the distal common iliac artery beyond the graft measures 2.3 cm, previously 2.4 cm. 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 Distal arteries including into the right lower extremity are patent. There is no dissection involvement.   The left iliac limb terminates in the common iliac artery. The diameter of the distal common iliac artery beyond the graft measures 2.5 cm, previously 2.6 cm. Aneurysm of the internal iliac artery measures 3.2 cm, previously 3.1 cm. The distal arteries are patent without dissection component. There is no evidence of active extravasation involving the iliac arteries as source of left retroperitoneal hemorrhage.   Veins: Not well assessed on this arterial phase exam.   Review of the MIP images confirms the above findings.   NON-VASCULAR   Hepatobiliary: Similar low-density liver lesions, incompletely characterized on this arterial phase exam but stable from prior. Unremarkable gallbladder.   Pancreas: No ductal dilatation or inflammation.   Spleen: Normal in size. Surgical clip is seen adjacent to the spleen.   Adrenals/Urinary Tract: No adrenal nodules. Clips adjacent to the left  adrenal gland. There is a small amount of hemorrhage medial to the left kidney. Decreased perfusion to the left kidney. No hydronephrosis of either kidney. Unremarkable urinary bladder.   Stomach/Bowel: Small hiatal hernia. No bowel obstruction or inflammation. Mild sigmoid diverticulosis without diverticulitis.   Lymphatic: No bulky adenopathy.   Reproductive: Imminent prostate.   Other: There is left retroperitoneal hemorrhage note extends from the level of the left mid kidney to the iliac bifurcation. There is no active extravasation within the area of hemorrhage, although source may be the left iliac system. This hemorrhage abuts the anterior aspect of the left iliopsoas muscle.  Probable fat containing bilateral inguinal hernias. Tiny fat containing umbilical hernia.   Musculoskeletal: Degenerative disc disease in the lumbar spine. Right hip arthroplasty. No acute osseous findings.   Review of the MIP images confirms the above findings.   IMPRESSION: 1. Acute type A thoracic aortic dissection, thrombosis of the false lumen. There is active extravasation adjacent to the ascending aorta as well as small volume hemopericardium. Origin cardiothoracic surgery consultation is needed. 2. Dissection extends into the brachiocephalic artery causing severe narrowing at the origin. Dissection extends into the left common carotid artery origin. 3. Prior stent graft repair of abdominal aortic aneurysm. The residual aneurysm sac has increased in size, faint internal hyperdensities may represent endoleak. 4. Moderate amount of left retroperitoneal hemorrhage extending from the iliac vasculature to the level of the left kidney. There is no active extravasation within this retroperitoneal hemorrhage. Source is uncertain, but may represent the left iliac landing zone. 5. Bilateral internal artery aneurysms, essentially stable. 6. Decreased perfusion of the left kidney, narrowing at the origin of the left renal artery.   Critical Value/emergent preliminary results were discussed by telephone at the time of the exam on 09/17/2022 at 6:09 pm to provider Garnette Gunner , who verbally acknowledged these results.     Electronically Signed   By: Keith Rake M.D.   On: 09/17/2022 18:42  MRI BRAIN:   FINDINGS: Brain: There are numerous bilateral foci of acute ischemia, greater in the right hemisphere, but affecting bilateral MCA and PCA territories as well as the cerebellum. There is associated cytotoxic edema. Multiple chronic microhemorrhages in a predominantly peripheral distribution. There is multifocal hyperintense T2-weighted signal within the white matter.  Generalized volume loss. The midline structures are normal.   Vascular: Major flow voids are preserved.   Skull and upper cervical spine: Normal calvarium and skull base. Visualized upper cervical spine and soft tissues are normal.   Sinuses/Orbits:No paranasal sinus fluid levels or advanced mucosal thickening. No mastoid or middle ear effusion. Normal orbits.   IMPRESSION: 1. Numerous bilateral foci of acute ischemia, greater in the right hemisphere, but affecting bilateral MCA and PCA territories as well as the cerebellum. Pattern is most consistent with a cardiac or proximal aortic embolic source. 2. Multiple chronic microhemorrhages in a predominantly peripheral distribution, possibly cerebral amyloid angiopathy.     Electronically Signed   By: Ulyses Jarred M.D.   On: 09/25/2022 01:41  Treatments: surgery:    NAME: LEAH, SKORA MEDICAL RECORD NO: [id number] ACCOUNT NO: [id number] DATE OF BIRTH: 02/23/1941 FACILITY: [facility] LOCATION: [facility] PHYSICIAN: Revonda Standard. Roxan Hockey, MD   Operative Report    DATE OF PROCEDURE: 09/17/2022   PREOPERATIVE DIAGNOSIS:  Type 1 aortic dissection.   POSTOPERATIVE DIAGNOSIS:  Type 1 aortic dissection.   PROCEDURE:   Median sternotomy.  Extracorporeal circulation. Hemiarch repair of type 1 dissection under moderate hypothermic circulatory arrest  using 28 mm Hemashield graft. Retrograde cerebral perfusion Femoral arterial cannulation   SURGEON:  Remo Lipps C. Roxan Hockey, MD   ASSISTANT:  Enid Cutter, PA-C.     Discharge Exam: Blood pressure 115/61, pulse 68, temperature 98 F (36.7 C), temperature source Oral, resp. rate 17, height 6' (1.829 m), weight 87.4 kg, SpO2 98 %. General appearance: alert, cooperative, and no distress Heart: regular rate and rhythm Lungs: clear to auscultation bilaterally Abdomen: soft, non-tender; bowel sounds normal; no masses,  no organomegaly Extremities: extremities normal, atraumatic, no  cyanosis or edema Wound: clean and dry Neuro:improving, continue Left extremities weakness >R  Discharge Medications:  The patient has been discharged on:   1.Beta Blocker:  Yes [ X  ]                              No   [   ]                              If No, reason:  2.Ace Inhibitor/ARB: Yes [  ]                                     No  [  x  ]                                     If No, reason: recent stroke, needs higher BP  3.Statin:   Yes [ X  ]                  No  [   ]                  If No, reason:  4.Ecasa:  Yes  [ X  ]                  No   [   ]                  If No, reason:  Patient had ACS upon admission: No   Plavix/P2Y12 inhibitor: Yes [   ]                                      No  [  X ]     Discharge Instructions     Amb Referral to Cardiac Rehabilitation   Complete by: As directed    Diagnosis: Other   After initial evaluation and assessments completed: Virtual Based Care may be provided alone or in conjunction with Phase 2 Cardiac Rehab based on patient barriers.: Yes   Intensive Cardiac Rehabilitation (ICR) [place] location only OR Traditional Cardiac Rehabilitation (TCR) *If criteria for ICR are not met will enroll in TCR [facility] only): Yes      Allergies as of 10/04/2022   No Known Allergies      Medication List     TAKE these medications    amiodarone 200 MG tablet Commonly known as: PACERONE Take 1 tablet (200 mg total) by mouth 2 (two) times daily. X 7 days, then decrease to 200 mg daily   amoxicillin-clavulanate 875-125 MG tablet Commonly  known as: AUGMENTIN Take 1 tablet by mouth 2 (two) times daily. X 10 days, please crush all meds in applesauce   aspirin 81 MG chewable tablet Chew 1 tablet (81 mg total) by mouth daily.   atorvastatin 40 MG tablet Commonly known as: LIPITOR Take 1 tablet (40 mg total) by mouth daily.   feeding supplement Liqd Take 237 mLs by mouth 2 (two) times daily between meals.   metoprolol tartrate 25  MG tablet Commonly known as: LOPRESSOR Take 0.5 tablets (12.5 mg total) by mouth 2 (two) times daily.   tamsulosin 0.4 MG Caps capsule Commonly known as: FLOMAX Take 1 capsule (0.4 mg total) by mouth daily.   traMADol 50 MG tablet Commonly known as: ULTRAM Take 1 tablet (50 mg total) by mouth every 4 (four) hours as needed for moderate pain.        Follow-up Information     [facility]. Schedule an appointment as soon as possible for a visit in 1 month(s).   Specialty: Neurology Why: stroke clinic Contact information: [place] [phone]        [name], MD Follow up on 10/25/2022.   Specialty: Cardiothoracic Surgery Why: Appointment is at 12:00, please get CXR at 11:30 at [facility] located on first floor of our office building Contact information: [place] [address] [phone]                 Signed:  Ellwood Handler, PA-C  10/04/2022, 10:35 AM

## 2022-09-30 NOTE — Progress Notes (Signed)
13 Days Post-Op Procedure(s) (LRB): REPAIR OF ACUTE ASCENDING THORACIC AORTIC DISSECTION USING 28 MM HEMASHIELD PLATINUM VASCULAR GRAFT (N/A) Subjective: Awake and alert, fidgety, denies pain  Objective: Vital signs in last 24 hours: Temp:  [97.9 F (36.6 C)-98.8 F (37.1 C)] 98.3 F (36.8 C) (11/17 0730) Pulse Rate:  [62-86] 74 (11/17 0600) Cardiac Rhythm: Normal sinus rhythm (11/16 2000) Resp:  [21-36] 27 (11/17 0600) BP: (116-158)/(60-120) 118/72 (11/17 0600) SpO2:  [92 %-100 %] 94 % (11/17 0600) Weight:  [87.4 kg] 87.4 kg (11/17 0416)  Hemodynamic parameters for last 24 hours:    Intake/Output from previous day: 11/16 0701 - 11/17 0700 In: 1346.4 [P.O.:480; I.V.:89.7; NG/GT:475; IV Piggyback:301.7] Out: 350 [Urine:350] Intake/Output this shift: No intake/output data recorded.  General appearance: alert, cooperative, and no distress Neurologic: left sided weakness Heart: regular rate and rhythm Lungs: diminished breath sounds bibasilar Abdomen: normal findings: soft, non-tender Wound: clean and dry  Lab Results: Recent Labs    09/28/22 0500 09/29/22 0504  WBC 20.4* 25.7*  HGB 9.0* 8.9*  HCT 28.6* 28.6*  PLT 281 296   BMET:  Recent Labs    09/28/22 1141 09/29/22 0504  NA 150* 148*  K 4.0 3.6  CL 118* 114*  CO2 26 24  GLUCOSE 215* 187*  BUN 35* 34*  CREATININE 1.00 0.97  CALCIUM 8.6* 8.3*    PT/INR: No results for input(s): "LABPROT", "INR" in the last 72 hours. ABG    Component Value Date/Time   PHART 7.479 (H) 09/20/2022 0830   HCO3 25.2 09/20/2022 0830   TCO2 26 09/20/2022 0830   ACIDBASEDEF 1.0 09/18/2022 1629   O2SAT 94 09/20/2022 0830   CBG (last 3)  Recent Labs    09/29/22 2305 09/30/22 0325 09/30/22 0728  GLUCAP 133* 129* 140*    Assessment/Plan: S/P Procedure(s) (LRB): REPAIR OF ACUTE ASCENDING THORACIC AORTIC DISSECTION USING 28 MM HEMASHIELD PLATINUM VASCULAR GRAFT (N/A) - NEURO- no change in exam CV- in SR on amiodarone  and Lopressor  BP better controlled RESP- good sat on RA ID- started on Maxipime for elevated WBC, presumed HCAP with no other apparent source. Day 2/5. WBC pending this AM RENAL- resolved AKI, AM labs pending this AM ENDO- CBG well controlled- change to [facility] and HS GI/ nutrition- Ate ~75% of meals yesterday  Monitor closely for PO intake after pulling feeding tube Deconditioning- PT/OT Plan CIR maybe next week Will transfer to progressive if bed available   LOS: 13 days    Loreli Slot 09/30/2022

## 2022-09-30 NOTE — Progress Notes (Signed)
Inpatient Rehab Admissions Coordinator:  Await medical clearance to admit to CIR. Will continue to follow.   Wolfgang Phoenix, MS, CCC-SLP Admissions Coordinator [phone]

## 2022-09-30 NOTE — Progress Notes (Signed)
eLink Physician-Brief Progress Note Patient Name: James Estrada DOB: 1941-08-05 MRN: [id number]   Date of Service  09/30/2022  HPI/Events of Note  Notified of lactate at 2.0 <-- 1.4.   On camera assessment, BP 112/52, HR 64, RR 22, O2 sats 94%. Pt is not on pressors.   CXR with pleural effusions.   eICU Interventions  Recheck lactate with AM labs.      Intervention Category Intermediate Interventions: Other:  Larinda Buttery 09/30/2022, 11:17 PM

## 2022-10-01 DIAGNOSIS — A419 Sepsis, unspecified organism: Secondary | ICD-10-CM | POA: Diagnosis not present

## 2022-10-01 DIAGNOSIS — Z9889 Other specified postprocedural states: Secondary | ICD-10-CM | POA: Diagnosis not present

## 2022-10-01 LAB — GLUCOSE, CAPILLARY
Glucose-Capillary: 106 mg/dL — ABNORMAL HIGH (ref 70–99)
Glucose-Capillary: 98 mg/dL (ref 70–99)
Glucose-Capillary: 113 mg/dL — ABNORMAL HIGH (ref 70–99)
Glucose-Capillary: 92 mg/dL (ref 70–99)
Glucose-Capillary: 157 mg/dL — ABNORMAL HIGH (ref 70–99)

## 2022-10-01 LAB — CBC
HCT: 27 % — ABNORMAL LOW (ref 39.0–52.0)
HCT: 26.1 % — ABNORMAL LOW (ref 39.0–52.0)
Hemoglobin: 8.7 g/dL — ABNORMAL LOW (ref 13.0–17.0)
Hemoglobin: 8.2 g/dL — ABNORMAL LOW (ref 13.0–17.0)
MCH: 29.1 pg (ref 26.0–34.0)
MCH: 28.7 pg (ref 26.0–34.0)
MCHC: 32.2 g/dL (ref 30.0–36.0)
MCHC: 31.4 g/dL (ref 30.0–36.0)
MCV: 90.3 fL (ref 80.0–100.0)
MCV: 91.3 fL (ref 80.0–100.0)
Platelets: 247 10*3/uL (ref 150–400)
Platelets: 269 10*3/uL (ref 150–400)
RBC: 2.99 MIL/uL — ABNORMAL LOW (ref 4.22–5.81)
RBC: 2.86 MIL/uL — ABNORMAL LOW (ref 4.22–5.81)
RDW: 15.6 % — ABNORMAL HIGH (ref 11.5–15.5)
RDW: 15.4 % (ref 11.5–15.5)
WBC: 29.6 10*3/uL — ABNORMAL HIGH (ref 4.0–10.5)
WBC: 26.3 10*3/uL — ABNORMAL HIGH (ref 4.0–10.5)
nRBC: 0 % (ref 0.0–0.2)
nRBC: 0 % (ref 0.0–0.2)

## 2022-10-01 LAB — BASIC METABOLIC PANEL
Anion gap: 12 (ref 5–15)
Anion gap: 8 (ref 5–15)
BUN: 74 mg/dL — ABNORMAL HIGH (ref 8–23)
BUN: 58 mg/dL — ABNORMAL HIGH (ref 8–23)
CO2: 21 mmol/L — ABNORMAL LOW (ref 22–32)
CO2: 21 mmol/L — ABNORMAL LOW (ref 22–32)
Calcium: 8.3 mg/dL — ABNORMAL LOW (ref 8.9–10.3)
Calcium: 8.1 mg/dL — ABNORMAL LOW (ref 8.9–10.3)
Chloride: 109 mmol/L (ref 98–111)
Chloride: 110 mmol/L (ref 98–111)
Creatinine, Ser: 2.64 mg/dL — ABNORMAL HIGH (ref 0.61–1.24)
Creatinine, Ser: 1.79 mg/dL — ABNORMAL HIGH (ref 0.61–1.24)
GFR, Estimated: 24 mL/min — ABNORMAL LOW (ref >60)
GFR, Estimated: 38 mL/min — ABNORMAL LOW (ref >60)
Glucose, Bld: 128 mg/dL — ABNORMAL HIGH (ref 70–99)
Glucose, Bld: 118 mg/dL — ABNORMAL HIGH (ref 70–99)
Potassium: 4.3 mmol/L (ref 3.5–5.1)
Potassium: 4.3 mmol/L (ref 3.5–5.1)
Sodium: 142 mmol/L (ref 135–145)
Sodium: 139 mmol/L (ref 135–145)

## 2022-10-01 LAB — VANCOMYCIN, RANDOM: Vancomycin Rm: 13 ug/mL

## 2022-10-01 LAB — LACTIC ACID, PLASMA: Lactic Acid, Venous: 1 mmol/L (ref 0.5–1.9)

## 2022-10-01 MED ORDER — LACTATED RINGERS IV SOLN: INTRAVENOUS | Status: DC

## 2022-10-01 MED ORDER — ASPIRIN 81 MG PO CHEW
81.0000 mg | CHEWABLE_TABLET | Freq: Every day | ORAL | Status: DC
  Administered 2022-10-01 – 2022-10-04 (×4): 81 mg via ORAL
  Filled 2022-10-01 (×4): qty 1

## 2022-10-01 MED ORDER — METOPROLOL TARTRATE 12.5 MG HALF TABLET
12.5000 mg | ORAL_TABLET | Freq: Two times a day (BID) | ORAL | Status: DC
  Administered 2022-10-01 – 2022-10-04 (×7): 12.5 mg via ORAL
  Filled 2022-10-01 (×7): qty 1

## 2022-10-01 MED ORDER — VANCOMYCIN HCL IN DEXTROSE 1-5 GM/200ML-% IV SOLN
1000.0000 mg | Freq: Once | INTRAVENOUS | Status: AC
  Administered 2022-10-01: 1000 mg via INTRAVENOUS
  Filled 2022-10-01: qty 200

## 2022-10-01 MED ORDER — PIPERACILLIN-TAZOBACTAM 3.375 G IVPB
3.3750 g | Freq: Three times a day (TID) | INTRAVENOUS | Status: DC
  Administered 2022-10-01 – 2022-10-02 (×3): 3.375 g via INTRAVENOUS
  Filled 2022-10-01 (×3): qty 50

## 2022-10-01 MED ORDER — TAMSULOSIN HCL 0.4 MG PO CAPS
0.4000 mg | ORAL_CAPSULE | Freq: Every day | ORAL | Status: DC
  Administered 2022-10-01 – 2022-10-04 (×4): 0.4 mg via ORAL
  Filled 2022-10-01 (×4): qty 1

## 2022-10-01 NOTE — Progress Notes (Signed)
EVENING ROUNDS NOTE :     [address]       [place] [address]             [phone]                 14 Days Post-Op Procedure(s) (LRB): REPAIR OF ACUTE ASCENDING THORACIC AORTIC DISSECTION USING 28 MM HEMASHIELD PLATINUM VASCULAR GRAFT (N/A)   Total Length of Stay:  LOS: 14 days  Events:   Stable day Floor tomorrow    BP (!) 107/54   Pulse 71   Temp 97.6 F (36.4 C)   Resp (!) 23   Ht 6' (1.829 m)   Wt 87.1 kg   SpO2 92%   BMI 26.04 kg/m          sodium chloride 10 mL/hr at 10/01/22 0700   lactated ringers 100 mL/hr at 10/01/22 0838   piperacillin-tazobactam (ZOSYN)  IV 3.375 g (10/01/22 1125)    I/O last 3 completed shifts: In: 832.5 [P.O.:240; I.V.:92.4; IV Piggyback:500.1] Out: 5400 [Urine:5400]      Latest Ref Rng & Units 10/01/2022    5:45 AM 09/30/2022    2:22 PM 09/30/2022    8:55 AM  CBC  WBC 4.0 - 10.5 K/uL 29.6  42.5  51.8   Hemoglobin 13.0 - 17.0 g/dL 8.7  9.2  9.4   Hematocrit 39.0 - 52.0 % 27.0  29.5  31.1   Platelets 150 - 400 K/uL 247  274  363        Latest Ref Rng & Units 10/01/2022    5:45 AM 09/30/2022    2:22 PM 09/30/2022    8:55 AM  BMP  Glucose 70 - 99 mg/dL 149  702  637   BUN 8 - 23 mg/dL 74  94  81   Creatinine 0.61 - 1.24 mg/dL 8.58  8.50  2.77   Sodium 135 - 145 mmol/L 142  137  144   Potassium 3.5 - 5.1 mmol/L 4.3  5.6  5.5   Chloride 98 - 111 mmol/L 109  107  110   CO2 22 - 32 mmol/L 21  17  17    Calcium 8.9 - 10.3 mg/dL 8.3  8.1  8.6     ABG    Component Value Date/Time   PHART 7.479 (H) 09/20/2022 0830   PCO2ART 34.0 09/20/2022 0830   PO2ART 66 (L) 09/20/2022 0830   HCO3 25.2 09/20/2022 0830   TCO2 26 09/20/2022 0830   ACIDBASEDEF 1.0 09/18/2022 1629   O2SAT 94 09/20/2022 0830       Adante Courington, MD 10/01/2022 6:02 PM

## 2022-10-01 NOTE — Progress Notes (Addendum)
[address]       [place] [address]             [phone]                 14 Days Post-Op Procedure(s) (LRB): REPAIR OF ACUTE ASCENDING THORACIC AORTIC DISSECTION USING 28 MM HEMASHIELD PLATINUM VASCULAR GRAFT (N/A)   Events: Transferred to the ICU overnight for AKI _______________________________________________________________ Vitals: BP (!) 124/56   Pulse 61   Temp 98 F (36.7 C) (Axillary)   Resp (!) 23   Ht 6' (1.829 m)   Wt 87.1 kg   SpO2 95%   BMI 26.04 kg/m  Filed Weights   09/29/22 0454 09/30/22 0416 10/01/22 0500  Weight: 84.3 kg 87.4 kg 87.1 kg     - Neuro: alert NAD  - Cardiovascular: Sinus  Drips: none.      - Pulm: EWOB    ABG    Component Value Date/Time   PHART 7.479 (H) 09/20/2022 0830   PCO2ART 34.0 09/20/2022 0830   PO2ART 66 (L) 09/20/2022 0830   HCO3 25.2 09/20/2022 0830   TCO2 26 09/20/2022 0830   ACIDBASEDEF 1.0 09/18/2022 1629   O2SAT 94 09/20/2022 0830    - Abd: ND - Extremity: trace edema  .Intake/Output      11/17 0701 11/18 0700 11/18 0701 11/19 0700   P.O.     I.V. (mL/kg) 77.7 (0.9)    NG/GT     IV Piggyback 400    Total Intake(mL/kg) 477.7 (5.5)    Urine (mL/kg/hr) 5400 (2.6)    Stool 0    Total Output 5400    Net -4922.3         Stool Occurrence 1 x       _______________________________________________________________ Labs:    Latest Ref Rng & Units 10/01/2022    5:45 AM 09/30/2022    2:22 PM 09/30/2022    8:55 AM  CBC  WBC 4.0 - 10.5 K/uL 29.6  42.5  51.8   Hemoglobin 13.0 - 17.0 g/dL 8.7  9.2  9.4   Hematocrit 39.0 - 52.0 % 27.0  29.5  31.1   Platelets 150 - 400 K/uL 247  274  363       Latest Ref Rng & Units 10/01/2022    5:45 AM 09/30/2022    2:22 PM 09/30/2022    8:55 AM  CMP  Glucose 70 - 99 mg/dL 914  782  956   BUN 8 - 23 mg/dL 74  94  81   Creatinine 0.61 - 1.24 mg/dL 2.13  0.86  5.78   Sodium 135 - 145 mmol/L 142  137  144   Potassium 3.5 - 5.1 mmol/L  4.3  5.6  5.5   Chloride 98 - 111 mmol/L 109  107  110   CO2 22 - 32 mmol/L 21  17  17    Calcium 8.9 - 10.3 mg/dL 8.3  8.1  8.6     CXR: -  _______________________________________________________________  Assessment and Plan: POD 14 s/p type A repair, CVA, concern for C-diff, AKI  Neuro: pain controlled CV: on asp, amio, metop, and lipitor Pulm: IS, ambulation Renal: creat trending down.  Receiving IV fluid.  Urinary retention, 2.5L out with foley placement.  Adding flow max.  Will continue to follow GI: on diet Heme: stable ID: concern for C-diff, on vanc, zosyn, and dificid Endo: SSI Dispo: continue ICU care for creat   James Estrada  [name] 10/01/2022 8:32 AM

## 2022-10-01 NOTE — Progress Notes (Addendum)
Advanced Heart Failure Rounding Note  PCP-Cardiologist: None   Subjective:    Developed sepsis and AKI yesterday. Concern for c.diff.   On vanc/mero/dificid. AF. WBC coming down. SCr improved. No further diarrhea this am  Denies CP or SOB. In NSR  Had urinary retention -> Foley placed 2L out    Objective:   Weight Range: 87.1 kg Body mass index is 26.04 kg/m.   Vital Signs:   Temp:  [98 F (36.7 C)-98.5 F (36.9 C)] 98 F (36.7 C) (11/17 1900) Pulse Rate:  [59-79] 61 (11/18 0700) Resp:  [14-25] 23 (11/18 0700) BP: (104-126)/(52-69) 124/56 (11/18 0700) SpO2:  [88 %-98 %] 95 % (11/18 0700) Weight:  [87.1 kg] 87.1 kg (11/18 0500) Last BM Date : 09/30/22  Weight change: Filed Weights   09/29/22 0454 09/30/22 0416 10/01/22 0500  Weight: 84.3 kg 87.4 kg 87.1 kg    Intake/Output:   Intake/Output Summary (Last 24 hours) at 10/01/2022 0821 Last data filed at 10/01/2022 0700 Gross per 24 hour  Intake 477.67 ml  Output 5400 ml  Net -4922.33 ml       Physical Exam   General:  Sitting up in bed No resp difficulty HEENT: normal Neck: supple. no JVD. Carotids 2+ bilat; no bruits. No lymphadenopathy or thryomegaly appreciated. Cor: PMI nondisplaced. Regular rate & rhythm. No rubs, gallops or murmurs. Lungs: clear decreased at left base Abdomen: soft, nontender, nondistended. No hepatosplenomegaly. No bruits or masses. Good bowel sounds. Extremities: no cyanosis, clubbing, rash, edema Neuro: alert follows commands intermittently spech improved Weak on left   Telemetry   NSR 60-70s Personally reviewed   Labs    CBC Recent Labs    09/30/22 1422 10/01/22 0545  WBC 42.5* 29.6*  HGB 9.2* 8.7*  HCT 29.5* 27.0*  MCV 91.9 90.3  PLT 274 247    Basic Metabolic Panel Recent Labs    30/16/01 1422 10/01/22 0545  NA 137 142  K 5.6* 4.3  CL 107 109  CO2 17* 21*  GLUCOSE 142* 128*  BUN 94* 74*  CREATININE 3.88* 2.64*  CALCIUM 8.1* 8.3*    Liver  Function Tests Recent Labs    09/28/22 1141  AST 29  ALT 25  ALKPHOS 62  BILITOT 0.6  PROT 5.5*  ALBUMIN 2.4*    No results for input(s): "LIPASE", "AMYLASE" in the last 72 hours. Cardiac Enzymes No results for input(s): "CKTOTAL", "CKMB", "CKMBINDEX", "TROPONINI" in the last 72 hours.  BNP: BNP (last 3 results) No results for input(s): "BNP" in the last 8760 hours.  ProBNP (last 3 results) No results for input(s): "PROBNP" in the last 8760 hours.   D-Dimer No results for input(s): "DDIMER" in the last 72 hours.  Hemoglobin A1C No results for input(s): "HGBA1C" in the last 72 hours.  Fasting Lipid Panel No results for input(s): "CHOL", "HDL", "LDLCALC", "TRIG", "CHOLHDL", "LDLDIRECT" in the last 72 hours.  Thyroid Function Tests No results for input(s): "TSH", "T4TOTAL", "T3FREE", "THYROIDAB" in the last 72 hours.  Invalid input(s): "FREET3"  Other results:   Imaging    DG Abd 1 View  Result Date: 09/30/2022 CLINICAL DATA:  Abdominal pain and distension EXAM: ABDOMEN - 1 VIEW COMPARISON:  Chest radiograph 1 day prior. FINDINGS: Oral contrast the colon and rectum. Barium swallow 1 day prior. No dilated is of large or small bowel. Normal transit of ingested contrast. Abdominal aorta bi-iliac stent graft noted.  RIGHT hip prosthetic. IMPRESSION: No evidence of bowel obstruction. Electronically Signed  By: Suzy Bouchard M.D.   On: 09/30/2022 16:14     Medications:     Scheduled Medications:  amiodarone  400 mg Oral BID   aspirin  81 mg Oral Daily   atorvastatin  40 mg Oral Daily   Chlorhexidine Gluconate Cloth  6 each Topical Daily   [facility], Patient & Family Education   Does not apply Once   feeding supplement  237 mL Oral BID BM   fidaxomicin  200 mg Oral BID   insulin aspart  0-15 Units Subcutaneous TID WC   insulin aspart  0-5 Units Subcutaneous QHS   metoprolol tartrate  12.5 mg Oral BID   sodium chloride flush  3 mL Intravenous  Q12H   vancomycin variable dose per unstable renal function (pharmacist dosing)   Does not apply See admin instructions    Infusions:  sodium chloride 10 mL/hr at 10/01/22 0700   lactated ringers     piperacillin-tazobactam (ZOSYN)  IV     vancomycin      PRN Medications: sodium chloride, levalbuterol, ondansetron (ZOFRAN) IV, mouth rinse, sodium chloride flush, traMADol    Patient Profile   81 y/o male w/ h/o HTN, HLD, AAA s/p stent graft admitted w/ Type 1 Aortic Dissection, s/p emergent repair.   Assessment/Plan    1. Sepsis/possible c.diff - has responded to IVF/abx/dificid - cx pending - need stool for c.diff - appreciate CCM management   2. Type 1 Aortic Dissection  - s/p emergent repair 11/4 -- Intra-op TEE EF 60-65%, RV ok   3. Hypertension  - losartan 50 mg/spiro 12.5/lopressor 25 bid on hold with sepsis   4. Post-Op PAF - had recurrent AF on 11/14. Now back in NSR on IV amio  - continue PO amio 400 bid - no AC with hemorrhagic component of stroke   5. Pericardial Effusion  - repeat limited echo 11/14 w/ trivial effusion    6. Acute Hypoxic Respiratory Failure - Extubated 11/9  - stable    7. Post-op CVA  - CT head 11/6 showed 3 small right-sided strokes 1 in the cerebellum 1 in the right basal ganglia and 1 in the frontal lobe with a 4 mm area of bleeding  - CTA - R carotid involved with dissection - to CIR when recovers from sepsis - PT/OT following. Passed swallow study   8. AKI  - Due to sepsis. - Scr improved with resuscitation  9. Urinary retention - Foley placed 11/17 - Start flomax   Length of Stay: [place], MD  10/01/2022, 8:21 AM  Advanced Heart Failure Team Pager [phone] (M-F; 7a - 5p)  Please contact [facility] for night-coverage after hours (5p -7a ) and weekends on [email or web address]

## 2022-10-01 NOTE — Progress Notes (Addendum)
NAME:  James Estrada, MRN:  [id number], DOB:  Aug 03, 1941, LOS: 58 ADMISSION DATE:  09/17/2022, CONSULTATION DATE:  11/8 REFERRING MD:  Bensimhon, CHIEF COMPLAINT:  encephalpathy, respiratory failure   History of Present Illness:  James Estrada is a 81 y/o gentleman with a history of AAA, GERD, HLD, HTN who presented with acute onset of weakness and pain across his chest and upper back that caused him to collapse on 11/4.  In the ED a code stroke was called.  He was was found to have an acute ascending aortic dissection with thrombosis of the false lumen with extension into the brachiocephalic artery with narrowing and into the proximal carotid artery.  CT scan also noted left retroperitoneal hemorrhage from the iliics to the left kidney and decreased perfusion to the left kidney.  He underwent emergency dissection repair with vascular graft on 11/4.  He was found to have embolic strokes postoperatively.  He continues to require mechanical ventilation due to encephalopathy and agitation. At baseline he is very active, walking every day.  PCCM was consulted for assistance with ventilator weaning.  Pertinent  Medical History  AAA status post endovascular repair in 2020 Hypertension Hyperlipidemia GERD Cataract extraction bilaterally  Significant Hospital Events: Including procedures, antibiotic start and stop dates in addition to other pertinent events   11/4 Ascending aortic dissection, s/p graft repair on bypass 11/8 oral sedatives started, off precedex 11/7 small ICA strokes 11/9 extubated 11/17 transferred back to the intensive care unit  Interim History / Subjective:    Patient has not had any diarrhea overnight.  He feels better.  Belly less distended.   Objective   Blood pressure (!) 124/56, pulse 61, temperature 98 F (36.7 C), temperature source Axillary, resp. rate (!) 23, height 6' (1.829 m), weight 87.1 kg, SpO2 95 %.        Intake/Output Summary (Last 24 hours) at  10/01/2022 0711 Last data filed at 10/01/2022 0700 Gross per 24 hour  Intake 477.67 ml  Output 5400 ml  Net -4922.33 ml   Filed Weights   09/29/22 0454 09/30/22 0416 10/01/22 0500  Weight: 84.3 kg 87.4 kg 87.1 kg   Examination: General: Elderly male resting comfortably in bed HENT: NCAT, tracking appropriately Lungs: Diminished sounds bilaterally no crackles no wheeze Cardiovascular: Regular rate rhythm, S1-S2 Abdomen: Mildly distended, soft soft no tenderness to palpation Extremities: No significant edema Neuro: Alert oriented following commands GU: Deferred Derm: No rash  Ancillary tests personally reviewed:  Serum creatinine 3.88, decreasing to 2.5  Assessment & Plan:   Sepsis Diarrhea Concern for C. difficile Leukocytosis, leukemoid reaction, white blood cell count 54,000, improving now decreasing Plan: Start lactated Ringer's 150 cc an hour for 1 more liter. Hold diuretics again today hold blood pressure medications Continue empiric antibiotic coverage with Zosyn vancomycin and Dificid C. difficile testing is still pending if he has another stool we need to make sure to send it. Continue enteric precautions Recheck BMP this evening   Acute ascending aortic dissection s/p graft repair Pericardial effusion -Postop care per T CTS  Postop paroxysmal atrial fibrillation Acute HFpEF Periodic hypertension with agitation - Hold diuresis, continue amiodarone, continue metoprolol 25 mg twice daily  Acute respiratory failure with hypoxia now extubated Mucus plugging R pleural effusion Plan: Mobility, I-S and flutter  AKI, improving Left renal artery narrowing demonstrated on CT -Plan: Follow urine output, electrolytes Appears to be improving, no need for nephrology consultation at this time  Embolic strokes complicating aortic dissection repair -  appreciate stroke team's management-  Will need stroke rehabilitation PT OT SLP  Hyperglycemia now well  controlled -Continue detemir 12 units twice daily plus sliding scale insulin Goal blood glucose 140-180  Thrombocytopenia, consumptive vs due to critical illness and recent aortic surgery. Acute blood loss anemia -monitor Transfuse for hemoglobin less than 7   Best Practice (right click and "Reselect all SmartList Selections" daily)   Diet/type: tubefeeds core track placed today DVT prophylaxis: SCD, start chemical prophylaxis once platelet count greater than 80 GI prophylaxis: H2B Lines: Central line, Arterial Line, and No longer needed.  Order written to d/c  Foley:  removal ordered  Code Status:  full code Last date of multidisciplinary goals of care discussion [full scope]   This patient is critically ill with multiple organ system failure; which, requires frequent high complexity decision making, assessment, support, evaluation, and titration of therapies. This was completed through the application of advanced monitoring technologies and extensive interpretation of multiple databases. During this encounter critical care time was devoted to patient care services described in this note for 32 minutes.  Josephine Igo, DO [facility] 10/01/2022 7:11 AM

## 2022-10-01 NOTE — Progress Notes (Signed)
Pharmacy Antibiotic Note  James Estrada is a 81 y.o. male admitted on 09/17/2022 with sepsis.  Pharmacy has been consulted for vancomycin and zosyn dosing. C diff treatment and testing started as well. Cr improved overnight. Vancomycin random is 13 mcg/ml ~12h after load suggesting clearance.  Plan: Vancomycin 1000mg  IV x1 - check VR in am Adjust Zosyn to 3.375g IV EI q8h Watch Cr, if improved tomorrow will schedule vancomycin   Height: 6' (182.9 cm) Weight: 87.1 kg (192 lb 0.3 oz) IBW/kg (Calculated) : 77.6  Temp (24hrs), Avg:98.3 F (36.8 C), Min:98 F (36.7 C), Max:98.5 F (36.9 C)  Recent Labs  Lab 09/28/22 0500 09/28/22 1141 09/29/22 0504 09/30/22 0855 09/30/22 1422 09/30/22 1610 09/30/22 2215 10/01/22 0545  WBC 20.4*  --  25.7* 51.8* 42.5*  --   --  29.6*  CREATININE 0.94 1.00 0.97 3.19* 3.88*  --   --  2.64*  LATICACIDVEN  --   --   --   --   --  1.4 2.0* 1.0  VANCORANDOM  --   --   --   --   --   --   --  13     Estimated Creatinine Clearance: 25.3 mL/min (A) (by C-G formula based on SCr of 2.64 mg/dL (H)).    No Known Allergies  Antimicrobials this admission: Cefepime 11/16 >> 11/17 Zosyn 11/17>> Vanc 11/17 >>  Dose adjustments this admission:  Microbiology results: 11/8 trach asp - enterobacter cloacaes ensitive to cefepime, bactrim, cipro, imipenem   13/8, PharmD, BCPS, [facility] Clinical Pharmacist [phone] Please check AMION for all [facility] Pharmacy numbers 10/01/2022

## 2022-10-02 DIAGNOSIS — A419 Sepsis, unspecified organism: Secondary | ICD-10-CM | POA: Diagnosis not present

## 2022-10-02 DIAGNOSIS — Z9889 Other specified postprocedural states: Secondary | ICD-10-CM | POA: Diagnosis not present

## 2022-10-02 LAB — BASIC METABOLIC PANEL
Anion gap: 11 (ref 5–15)
Anion gap: 9 (ref 5–15)
BUN: 44 mg/dL — ABNORMAL HIGH (ref 8–23)
BUN: 38 mg/dL — ABNORMAL HIGH (ref 8–23)
CO2: 23 mmol/L (ref 22–32)
CO2: 26 mmol/L (ref 22–32)
Calcium: 8.1 mg/dL — ABNORMAL LOW (ref 8.9–10.3)
Calcium: 8.1 mg/dL — ABNORMAL LOW (ref 8.9–10.3)
Chloride: 106 mmol/L (ref 98–111)
Chloride: 101 mmol/L (ref 98–111)
Creatinine, Ser: 1.55 mg/dL — ABNORMAL HIGH (ref 0.61–1.24)
Creatinine, Ser: 1.81 mg/dL — ABNORMAL HIGH (ref 0.61–1.24)
GFR, Estimated: 46 mL/min — ABNORMAL LOW (ref >60)
GFR, Estimated: 38 mL/min — ABNORMAL LOW (ref >60)
Glucose, Bld: 124 mg/dL — ABNORMAL HIGH (ref 70–99)
Glucose, Bld: 135 mg/dL — ABNORMAL HIGH (ref 70–99)
Potassium: 4.1 mmol/L (ref 3.5–5.1)
Potassium: 4.1 mmol/L (ref 3.5–5.1)
Sodium: 140 mmol/L (ref 135–145)
Sodium: 136 mmol/L (ref 135–145)

## 2022-10-02 LAB — CBC
HCT: 24.7 % — ABNORMAL LOW (ref 39.0–52.0)
HCT: 28.2 % — ABNORMAL LOW (ref 39.0–52.0)
Hemoglobin: 7.9 g/dL — ABNORMAL LOW (ref 13.0–17.0)
Hemoglobin: 8.8 g/dL — ABNORMAL LOW (ref 13.0–17.0)
MCH: 28.6 pg (ref 26.0–34.0)
MCH: 28.3 pg (ref 26.0–34.0)
MCHC: 32 g/dL (ref 30.0–36.0)
MCHC: 31.2 g/dL (ref 30.0–36.0)
MCV: 89.5 fL (ref 80.0–100.0)
MCV: 90.7 fL (ref 80.0–100.0)
Platelets: 250 10*3/uL (ref 150–400)
Platelets: 314 10*3/uL (ref 150–400)
RBC: 2.76 MIL/uL — ABNORMAL LOW (ref 4.22–5.81)
RBC: 3.11 MIL/uL — ABNORMAL LOW (ref 4.22–5.81)
RDW: 15.1 % (ref 11.5–15.5)
RDW: 15.1 % (ref 11.5–15.5)
WBC: 20.8 10*3/uL — ABNORMAL HIGH (ref 4.0–10.5)
WBC: 20.5 10*3/uL — ABNORMAL HIGH (ref 4.0–10.5)
nRBC: 0 % (ref 0.0–0.2)
nRBC: 0 % (ref 0.0–0.2)

## 2022-10-02 LAB — GLUCOSE, CAPILLARY
Glucose-Capillary: 116 mg/dL — ABNORMAL HIGH (ref 70–99)
Glucose-Capillary: 119 mg/dL — ABNORMAL HIGH (ref 70–99)
Glucose-Capillary: 97 mg/dL (ref 70–99)
Glucose-Capillary: 141 mg/dL — ABNORMAL HIGH (ref 70–99)

## 2022-10-02 LAB — VANCOMYCIN, RANDOM: Vancomycin Rm: 9 ug/mL

## 2022-10-02 MED ORDER — PIPERACILLIN-TAZOBACTAM 3.375 G IVPB
3.3750 g | Freq: Three times a day (TID) | INTRAVENOUS | Status: DC
  Administered 2022-10-02 – 2022-10-04 (×6): 3.375 g via INTRAVENOUS
  Filled 2022-10-02 (×6): qty 50

## 2022-10-02 MED ORDER — VANCOMYCIN HCL 1250 MG/250ML IV SOLN
1250.0000 mg | INTRAVENOUS | Status: DC
  Filled 2022-10-02: qty 250

## 2022-10-02 MED ORDER — SODIUM CHLORIDE 0.9 % IV SOLN
1.0000 g | Freq: Every day | INTRAVENOUS | Status: DC
  Filled 2022-10-02: qty 10

## 2022-10-02 MED ORDER — POTASSIUM CHLORIDE CRYS ER 20 MEQ PO TBCR
20.0000 meq | EXTENDED_RELEASE_TABLET | Freq: Once | ORAL | Status: AC
  Administered 2022-10-02: 20 meq via ORAL
  Filled 2022-10-02: qty 1

## 2022-10-02 MED ORDER — FUROSEMIDE 10 MG/ML IJ SOLN
40.0000 mg | Freq: Once | INTRAMUSCULAR | Status: AC
  Administered 2022-10-02: 40 mg via INTRAVENOUS
  Filled 2022-10-02: qty 4

## 2022-10-02 NOTE — Progress Notes (Signed)
      [address]       [place] [address]             [phone]                 15 Days Post-Op Procedure(s) (LRB): REPAIR OF ACUTE ASCENDING THORACIC AORTIC DISSECTION USING 28 MM HEMASHIELD PLATINUM VASCULAR GRAFT (N/A)   Events: No events _______________________________________________________________ Vitals: BP (!) 126/51   Pulse 70   Temp 98.6 F (37 C) (Oral)   Resp (!) 23   Ht 6' (1.829 m)   Wt 87.1 kg   SpO2 95%   BMI 26.04 kg/m  Filed Weights   09/30/22 0416 10/01/22 0500 10/02/22 0500  Weight: 87.4 kg 87.1 kg 87.1 kg     - Neuro: alert NAD  - Cardiovascular: Sinus  Drips: none.      - Pulm: EWOB    ABG    Component Value Date/Time   PHART 7.479 (H) 09/20/2022 0830   PCO2ART 34.0 09/20/2022 0830   PO2ART 66 (L) 09/20/2022 0830   HCO3 25.2 09/20/2022 0830   TCO2 26 09/20/2022 0830   ACIDBASEDEF 1.0 09/18/2022 1629   O2SAT 94 09/20/2022 0830    - Abd: ND - Extremity: trace edema  .Intake/Output      11/18 0701 11/19 0700 11/19 0701 11/20 0700   P.O. 720    I.V. (mL/kg) 499.8 (5.7)    IV Piggyback 137.4 12.4   Total Intake(mL/kg) 1357.3 (15.6) 12.4 (0.1)   Urine (mL/kg/hr) 4500 (2.2)    Stool     Total Output 4500    Net -3142.7 +12.4           _______________________________________________________________ Labs:    Latest Ref Rng & Units 10/02/2022    5:59 AM 10/01/2022    5:57 PM 10/01/2022    5:45 AM  CBC  WBC 4.0 - 10.5 K/uL 20.8  26.3  29.6   Hemoglobin 13.0 - 17.0 g/dL 7.9  8.2  8.7   Hematocrit 39.0 - 52.0 % 24.7  26.1  27.0   Platelets 150 - 400 K/uL 250  269  247       Latest Ref Rng & Units 10/02/2022    5:59 AM 10/01/2022    5:57 PM 10/01/2022    5:45 AM  CMP  Glucose 70 - 99 mg/dL 626  948  546   BUN 8 - 23 mg/dL 44  58  74   Creatinine 0.61 - 1.24 mg/dL 2.70  3.50  0.93   Sodium 135 - 145 mmol/L 140  139  142   Potassium 3.5 - 5.1 mmol/L 4.1  4.3  4.3   Chloride 98 - 111 mmol/L 106   110  109   CO2 22 - 32 mmol/L 23  21  21    Calcium 8.9 - 10.3 mg/dL 8.1  8.1  8.3     CXR: -  _______________________________________________________________  Assessment and Plan: POD 15 s/p type A repair, CVA, concern for C-diff, AKI  Neuro: pain controlled CV: on asp, amio, metop, and lipitor Pulm: IS, ambulation Renal: creat trending down.  Receiving IV fluid.  Urinary retention, 2.5L out with foley placement.  Adding flow max.  Will continue to follow GI: on diet.  No more diarrhea Heme: stable ID: concern for C-diff, on vanc, zosyn, and dificid Endo: SSI Dispo: floor today   10/02/2022 8:38 AM

## 2022-10-02 NOTE — Progress Notes (Signed)
Advanced Heart Failure Rounding Note  PCP-Cardiologist: None   Subjective:    Developed sepsis and AKI yesterday on 11/17. Also had urinary retention and Foley placed  Treated empirically for sepsis and c.diff.   Now much improved. Continue to improve from his stroke. Following coomands   Objective:   Weight Range: 87.1 kg Body mass index is 26.04 kg/m.   Vital Signs:   Temp:  [97.6 F (36.4 C)-98.6 F (37 C)] 98.6 F (37 C) (11/19 0635) Pulse Rate:  [60-78] 70 (11/19 0800) Resp:  [16-25] 23 (11/19 0800) BP: (93-130)/(43-62) 126/51 (11/19 0800) SpO2:  [89 %-96 %] 95 % (11/19 0800) Weight:  [87.1 kg] 87.1 kg (11/19 0500) Last BM Date : 09/30/22  Weight change: Filed Weights   09/30/22 0416 10/01/22 0500 10/02/22 0500  Weight: 87.4 kg 87.1 kg 87.1 kg    Intake/Output:   Intake/Output Summary (Last 24 hours) at 10/02/2022 0932 Last data filed at 10/02/2022 0800 Gross per 24 hour  Intake 1369.7 ml  Output 4500 ml  Net -3130.3 ml       Physical Exam   General:  Sitting up. No resp difficulty HEENT: normal Neck: supple. no JVD. Carotids 2+ bilat; no bruits. No lymphadenopathy or thryomegaly appreciated. Cor: PMI nondisplaced. Regular rate & rhythm. No rubs, gallops or murmurs. Lungs: clear Abdomen: soft, nontender, nondistended. No hepatosplenomegaly. No bruits or masses. Good bowel sounds. Extremities: no cyanosis, clubbing, rash, edema Neuro: alert + aphasia (expressive and receptive) weak on left   Telemetry   NSR 60-70s Personally reviewed   Labs    CBC Recent Labs    10/01/22 1757 10/02/22 0559  WBC 26.3* 20.8*  HGB 8.2* 7.9*  HCT 26.1* 24.7*  MCV 91.3 89.5  PLT 269 250    Basic Metabolic Panel Recent Labs    16/96/78 1757 10/02/22 0559  NA 139 140  K 4.3 4.1  CL 110 106  CO2 21* 23  GLUCOSE 118* 124*  BUN 58* 44*  CREATININE 1.79* 1.55*  CALCIUM 8.1* 8.1*    Liver Function Tests No results for input(s): "AST",  "ALT", "ALKPHOS", "BILITOT", "PROT", "ALBUMIN" in the last 72 hours.  No results for input(s): "LIPASE", "AMYLASE" in the last 72 hours. Cardiac Enzymes No results for input(s): "CKTOTAL", "CKMB", "CKMBINDEX", "TROPONINI" in the last 72 hours.  BNP: BNP (last 3 results) No results for input(s): "BNP" in the last 8760 hours.  ProBNP (last 3 results) No results for input(s): "PROBNP" in the last 8760 hours.   D-Dimer No results for input(s): "DDIMER" in the last 72 hours.  Hemoglobin A1C No results for input(s): "HGBA1C" in the last 72 hours.  Fasting Lipid Panel No results for input(s): "CHOL", "HDL", "LDLCALC", "TRIG", "CHOLHDL", "LDLDIRECT" in the last 72 hours.  Thyroid Function Tests No results for input(s): "TSH", "T4TOTAL", "T3FREE", "THYROIDAB" in the last 72 hours.  Invalid input(s): "FREET3"  Other results:   Imaging    No results found.   Medications:     Scheduled Medications:  amiodarone  400 mg Oral BID   aspirin  81 mg Oral Daily   atorvastatin  40 mg Oral Daily   Chlorhexidine Gluconate Cloth  6 each Topical Daily   [facility], Patient & Family Education   Does not apply Once   feeding supplement  237 mL Oral BID BM   insulin aspart  0-15 Units Subcutaneous TID WC   insulin aspart  0-5 Units Subcutaneous QHS   metoprolol tartrate  12.5 mg Oral BID   sodium chloride flush  3 mL Intravenous Q12H   tamsulosin  0.4 mg Oral Daily    Infusions:  sodium chloride 10 mL/hr at 10/01/22 0836   cefTRIAXone (ROCEPHIN)  IV     lactated ringers Stopped (10/02/22 0612)    PRN Medications: sodium chloride, levalbuterol, ondansetron (ZOFRAN) IV, mouth rinse, sodium chloride flush, traMADol    Patient Profile   81 y/o male w/ h/o HTN, HLD, AAA s/p stent graft admitted w/ Type 1 Aortic Dissection, s/p emergent repair.   Assessment/Plan   1. Sepsis/possible c.diff - has responded to IVF/abx/dificid - cx negative - suspect major issue  was urinary retention  - will narrow abx to zosyn  2. Type 1 Aortic Dissection  - s/p emergent repair 11/4 -- Intra-op TEE EF 60-65%, RV ok   3. Hypertension  - losartan 50 mg/spiro 12.5/lopressor 25 bid on hold with sepsis - lopressor restarted. Can restart others as BP requires   4. Post-Op PAF - had recurrent AF on 11/14. Now back in NSR on IV amio  - continue PO amio 400 bid - no AC with hemorrhagic component of stroke   5. Pericardial Effusion  - repeat limited echo 11/14 w/ trivial effusion    6. Acute Hypoxic Respiratory Failure - Extubated 11/9  - stable  - volume up 12 pounds with IVF resuscitation -> give lasix 40IV today   7. Post-op CVA  - CT head 11/6 showed 3 small right-sided strokes 1 in the cerebellum 1 in the right basal ganglia and 1 in the frontal lobe with a 4 mm area of bleeding  - CTA - R carotid involved with dissection - to CIR this week  - PT/OT following. Passed swallow study   8. AKI  - Due to sepsis/urinary retention - Scr improved with resuscitation. Now 1.5  9. Urinary retention - Foley placed 11/17 - Flomax started - Will need voiding trials    Length of Stay: [place], MD  10/02/2022, 9:32 AM  Advanced Heart Failure Team Pager [phone] (M-F; 7a - 5p)  Please contact [facility] for night-coverage after hours (5p -7a ) and weekends on [email or web address]

## 2022-10-02 NOTE — Progress Notes (Signed)
Pharmacy Antibiotic Note  James Estrada is a 81 y.o. male admitted on 09/17/2022 with sepsis.  Pharmacy has been consulted for vancomycin and zosyn dosing. C diff treatment and testing started as well.  Cr has stabilized ~1.5 mg/dl, vancomycin random is 9 mcg/ml indicating good vancomycin clearance.  Plan: Schedule vancomycin 1250mg  IV q24h - est AUC 496 Adjust Zosyn to 3.375g IV EI q8h   Height: 6' (182.9 cm) Weight: 87.1 kg (192 lb 0.3 oz) IBW/kg (Calculated) : 77.6  Temp (24hrs), Avg:98.1 F (36.7 C), Min:97.6 F (36.4 C), Max:98.6 F (37 C)  Recent Labs  Lab 09/30/22 0855 09/30/22 1422 09/30/22 1610 09/30/22 2215 10/01/22 0545 10/01/22 1757 10/02/22 0559  WBC 51.8* 42.5*  --   --  29.6* 26.3* 20.8*  CREATININE 3.19* 3.88*  --   --  2.64* 1.79* 1.55*  LATICACIDVEN  --   --  1.4 2.0* 1.0  --   --   VANCORANDOM  --   --   --   --  13  --  9     Estimated Creatinine Clearance: 43.1 mL/min (A) (by C-G formula based on SCr of 1.55 mg/dL (H)).    No Known Allergies  Antimicrobials this admission: Cefepime 11/16 >> 11/17 Zosyn 11/17>> Vanc 11/17 >>  Dose adjustments this admission:  Microbiology results: 11/8 trach asp - enterobacter cloacaes ensitive to cefepime, bactrim, cipro, imipenem   13/8, PharmD, BCPS, [facility] Clinical Pharmacist [phone] Please check AMION for all [facility] numbers 10/02/2022

## 2022-10-02 NOTE — Progress Notes (Signed)
   10/02/22 1423  Vitals  Temp 98.8 F (37.1 C)  Temp Source Oral  BP 120/63  MAP (mmHg) 78  BP Location Left Arm  BP Method Automatic  Patient Position (if appropriate) Lying  Pulse Rate 69  Pulse Rate Source Monitor  ECG Heart Rate 70  Resp 17  Level of Consciousness  Level of Consciousness Alert  MEWS COLOR  MEWS Score Color Green  Oxygen Therapy  SpO2 96 %  O2 Device Room Air  Pain Assessment  Pain Scale 0-10  Pain Score 0  Glasgow Coma Scale  Eye Opening 4  Best Verbal Response (NON-intubated) 4  Best Motor Response 6  Glasgow Coma Scale Score 14  MEWS Score  MEWS Temp 0  MEWS Systolic 0  MEWS Pulse 0  MEWS RR 0  MEWS LOC 0  MEWS Score 0   Pt admitted to 4E Room 1 CCMD notified VSS Daughter and Wife at bedside Call bell within reach

## 2022-10-02 NOTE — Progress Notes (Incomplete)
Transfer to different bed (stand step pivot and sit) requires 2 person assist.

## 2022-10-02 NOTE — Progress Notes (Signed)
NAME:  James Estrada, MRN:  [id number], DOB:  01/05/1941, LOS: 15 ADMISSION DATE:  09/17/2022, CONSULTATION DATE:  11/8 REFERRING MD:  Bensimhon, CHIEF COMPLAINT:  encephalpathy, respiratory failure   History of Present Illness:  [name] is a 81 y/o gentleman with a history of AAA, GERD, HLD, HTN who presented with acute onset of weakness and pain across his chest and upper back that caused him to collapse on 11/4.  In the ED a code stroke was called.  He was was found to have an acute ascending aortic dissection with thrombosis of the false lumen with extension into the brachiocephalic artery with narrowing and into the proximal carotid artery.  CT scan also noted left retroperitoneal hemorrhage from the iliics to the left kidney and decreased perfusion to the left kidney.  He underwent emergency dissection repair with vascular graft on 11/4.  He was found to have embolic strokes postoperatively.  He continues to require mechanical ventilation due to encephalopathy and agitation. At baseline he is very active, walking every day.  PCCM was consulted for assistance with ventilator weaning.  Pertinent  Medical History  AAA status post endovascular repair in 2020 Hypertension Hyperlipidemia GERD Cataract extraction bilaterally  Significant Hospital Events: Including procedures, antibiotic start and stop dates in addition to other pertinent events   11/4 Ascending aortic dissection, s/p graft repair on bypass 11/8 oral sedatives started, off precedex 11/7 small ICA strokes 11/9 extubated 11/17 transferred back to the intensive care unit  Interim History / Subjective:   Kidney function improving.  Advancing diet.  Patient feeling better   Objective   Blood pressure (!) 122/57, pulse 67, temperature 98.6 F (37 C), temperature source Oral, resp. rate 18, height 6' (1.829 m), weight 87.1 kg, SpO2 93 %.        Intake/Output Summary (Last 24 hours) at 10/02/2022 0746 Last data filed  at 10/02/2022 0700 Gross per 24 hour  Intake 1357.26 ml  Output 4500 ml  Net -3142.74 ml   Filed Weights   09/30/22 0416 10/01/22 0500 10/02/22 0500  Weight: 87.4 kg 87.1 kg 87.1 kg   Examination: General: Elderly male resting comfortably in bed HENT: NCAT, tracking appropriately Lungs: Bilateral diminished breath sounds no crackles no wheeze Cardiovascular: Regular rhythm, S1-S2 Abdomen: Soft nontender nondistended Extremities: No significant edema Neuro: Alert oriented following commands GU: Deferred Derm: No rash  Ancillary tests personally reviewed:  Serum creatinine 3.88, decreasing to 2.5  Assessment & Plan:   Bladder Outlet Obstruction  Acute renal failure 2/2 above  Sepsis Diarrhea Concern for C. difficile Leukocytosis, leukemoid reaction, white blood cell count 54,000, improving now decreasing Plan: I think less likely that he has C. difficile. Can take off of enteric precautions Cover for urinary tract infection Stop Zosyn vancomycin and Dificid Complete ceftriaxone for 3 more days  Acute ascending aortic dissection s/p graft repair Pericardial effusion -Postop care per cardiothoracic surgery  Postop paroxysmal atrial fibrillation Acute HFpEF Periodic hypertension with agitation - Holding diuresis, continue amiodarone continue metoprolol 12.5 mg twice daily  Acute respiratory failure with hypoxia now extubated Mucus plugging R pleural effusion Plan: Mobility, I-S and flutter  Left renal artery narrowing demonstrated on CT -Plan: Outpatient follow-up regarding renal artery narrowing  Embolic strokes complicating aortic dissection repair -appreciate stroke team's management-  Will need stroke rehabilitation PT OT SLP  Hyperglycemia now well controlled -Continue detemir 12 units twice daily plus sliding scale insulin Goal blood glucose 140-180  Thrombocytopenia, consumptive vs due to critical  illness and recent aortic surgery. Acute blood loss  anemia -monitor Monitor for any sign of bleeding, transfusion threshold for hemoglobin less than 7.   Best Practice (right click and "Reselect all SmartList Selections" daily)   Diet/type: tubefeeds core track placed today DVT prophylaxis: SCD, start chemical prophylaxis once platelet count greater than 80 GI prophylaxis: H2B Lines: Central line, Arterial Line, and No longer needed.  Order written to d/c  Foley:  removal ordered  Code Status:  full code Last date of multidisciplinary goals of care discussion [full scope]  This patient is critically ill with multiple organ system failure; which, requires frequent high complexity decision making, assessment, support, evaluation, and titration of therapies. This was completed through the application of advanced monitoring technologies and extensive interpretation of multiple databases. During this encounter critical care time was devoted to patient care services described in this note for 32 minutes.   Garner Nash, DO [facility] 10/02/2022 7:46 AM

## 2022-10-03 DIAGNOSIS — Z9889 Other specified postprocedural states: Secondary | ICD-10-CM | POA: Diagnosis not present

## 2022-10-03 LAB — GLUCOSE, CAPILLARY: Glucose-Capillary: 126 mg/dL — ABNORMAL HIGH (ref 70–99)

## 2022-10-03 MED ORDER — AMIODARONE HCL 200 MG PO TABS
200.0000 mg | ORAL_TABLET | Freq: Two times a day (BID) | ORAL | Status: DC
  Administered 2022-10-03 – 2022-10-04 (×3): 200 mg via ORAL
  Filled 2022-10-03 (×3): qty 1

## 2022-10-03 NOTE — Plan of Care (Signed)

## 2022-10-03 NOTE — Progress Notes (Signed)
Speech Language Pathology Treatment: Cognitive-Linquistic  Patient Details Name: James Estrada MRN: [id number] DOB: [birth year] Today's Date: 10/03/2022 Time: 1137-1208 SLP Time Calculation (min) (ACUTE ONLY): 31 min  Assessment / Plan / Recommendation Clinical Impression  Now that [name] is communicating more, it was possible to complete a more thorough cognitive-communicative assessment. Today it is clear he is aphasic, and likely right brain dominant for language (he is left handed).  He demonstrates significant comprehension deficits, requiring written single word and short-sentence cues to follow commands like "close your eyes" or "show me your thumb." He was unable to discriminate among basic objects around the room.  He named 1/5 basic objects and required written cue and verbal model to produce word.  Spontaneous speech is fluent and littered with functor words and fillers but not words that carry much semantic meaning.  There are paraphasias (eg., caterpillar for calendar) and poor recognition. Repetition is intact - it will be useful, along with preserved reading, to facilitate understanding.  Discussed with Mrs. Camino ways to help with understanding (introducing topic before beginning to talk, discussing topics that are more familiar, avoiding complex conversations, enhancing communication with gesture/pictures to help comprehension.) Provided her with information about the National Aph[employer] as a resource.  James Estrada will benefit from intensive aphasia therapy in AIR.     HPI HPI: Pt is a 81 y/o M admitted [facility] on 09/17/22 with type 1 aortic dissection requiring emergent repair. Head CT 11/6 acute infarcts within R MCA involvement, including R cerebellum, basal ganglia, frontal lobe. ETT 11/4-11/9. PMH AAA s/p endovascular repair (2020), HTN, HLD, GERD; BSE completed on 09/27/22 with recommendation for ice chips/sips of water, but NPO status prior to objective  assessment being completed.      SLP Plan  Continue with current plan of care      Recommendations for follow up therapy are one component of a multi-disciplinary discharge planning process, led by the attending physician.  Recommendations may be updated based on patient status, additional functional criteria and insurance authorization.    Recommendations  Diet recommendations: Dysphagia 2 (fine chop);Thin liquid Liquids provided via: Straw;Cup Medication Administration: Crushed with puree Supervision: Staff to assist with self feeding Compensations: Minimize environmental distractions;Slow rate;Small sips/bites;Lingual sweep for clearance of pocketing Postural Changes and/or Swallow Maneuvers: Seated upright 90 degrees                Oral Care Recommendations: Oral care BID Follow Up Recommendations: Acute inpatient rehab (3hours/day) Assistance recommended at discharge: Frequent or constant Supervision/Assistance SLP Visit Diagnosis: Aphasia (R47.01) Plan: Continue with current plan of care        James Wingate L. Samson Frederic, MA CCC/SLP Clinical Specialist - Acute Care SLP Acute Rehabilitation Services Office number [phone]    Blenda Mounts Laurice  10/03/2022, 12:15 PM

## 2022-10-03 NOTE — Progress Notes (Signed)
Physical Therapy Treatment Patient Details Name: James Estrada MRN: [id number] DOB: January 05, 1941 Today's Date: 10/03/2022   History of Present Illness Pt is a 81 y/o M admitted to [facility] on 09/17/22 with BLE weakness, chest + back pain, admitted with CT angio shows type dissection with thrombosed false lumen. Repair of type 1 aortic dissection performed 11/4. Head CT 11/6 acute infarcts within R MCA involvement, including R cerebellum, basal ganglia, frontal lobe. ETT 11/4-11/9. PMH AAA s/p endovascular repair (2020), HTN, HLD, GERD.    PT Comments    Pt is continuing to demonstrate good progress towards his therapy goals. He is demonstrating improved communication skills, but still needs extra time to process and find his words, sometime parroting after therapist or family member though. Pt also able to scan and maintain eye contact to his L and attend to his L limbs today. He continues to demonstrate apraxia that impacts his ability to take steps at EOB. He is currently requiring modAx2 for transfers to stand and maxAx2 for stand step transfers, demonstrating improved L knee stability. Will continue to follow acutely. Current recommendations remain appropriate.    Recommendations for follow up therapy are one component of a multi-disciplinary discharge planning process, led by the attending physician.  Recommendations may be updated based on patient status, additional functional criteria and insurance authorization.  Follow Up Recommendations  Acute inpatient rehab (3hours/day)     Assistance Recommended at Discharge Frequent or constant Supervision/Assistance  Patient can return home with the following Two people to help with walking and/or transfers;Two people to help with bathing/dressing/bathroom;Assistance with cooking/housework;Assistance with feeding;Assist for transportation;Help with stairs or ramp for entrance   Equipment Recommendations  Other (comment) (TBD)    Recommendations  for Other Services       Precautions / Restrictions Precautions Precautions: Fall;Other (comment);Sternal Precaution Booklet Issued: No Precaution Comments: fecal incontinence, L inattention Restrictions Weight Bearing Restrictions: Yes Other Position/Activity Restrictions: sternal precautions     Mobility  Bed Mobility Overal bed mobility: Needs Assistance Bed Mobility: Rolling, Sidelying to Sit, Sit to Supine Rolling: Min assist Sidelying to sit: Min assist, +2 for physical assistance, +2 for safety/equipment       General bed mobility comments: min assist for rolling and seqeucing sidelying to sitting. cueing for technique and increased time required to process.    Transfers Overall transfer level: Needs assistance Equipment used: Rolling walker (2 wheels), 2 person hand held assist Transfers: Sit to/from Stand, Bed to chair/wheelchair/BSC Sit to Stand: Mod assist, +2 physical assistance, +2 safety/equipment   Step pivot transfers: Max assist, +2 physical assistance, +2 safety/equipment       General transfer comment: pt standing at EOB mulitple times with mod assist +2, utilized RW and bilateral hand held assist but difficulty sequencing/motor planning stepping with LEs, max asisst +2 to weightshift and advance LEs and maintain balance to reach chair with bil HHA. L knee instability noted, needing blocking intermittently, but much less frequent buckling and no hyperextension noted today    Ambulation/Gait Ambulation/Gait assistance: Max assist, +2 physical assistance, +2 safety/equipment Gait Distance (Feet): 1 Feet Assistive device: 2 person hand held assist Gait Pattern/deviations: Step-to pattern, Decreased stride length, Decreased weight shift to right, Decreased weight shift to left, Knees buckling, Trunk flexed, Leaning posteriorly Gait velocity: reduced Gait velocity interpretation: <1.31 ft/sec, indicative of household ambulator   General Gait Details: max  asisst +2 to weightshift and advance LEs and maintain balance to reach chair with bil HHA. L knee instability noted,  needing blocking intermittently, but much less frequent buckling and no hyperextension noted today   Stairs             Wheelchair Mobility    Modified Rankin (Stroke Patients Only) Modified Rankin (Stroke Patients Only) Pre-Morbid Rankin Score: No symptoms Modified Rankin: Severe disability     Balance Overall balance assessment: Needs assistance Sitting-balance support: Single extremity supported, Bilateral upper extremity supported, No upper extremity supported, Feet supported Sitting balance-Leahy Scale: Fair Sitting balance - Comments: min guard to close supervision, needs cues to place hands on lap instead of pushing through bed   Standing balance support: Bilateral upper extremity supported, During functional activity Standing balance-Leahy Scale: Poor Standing balance comment: relies on BUE and external support                            Cognition Arousal/Alertness: Awake/alert Behavior During Therapy: Flat affect Overall Cognitive Status: Difficult to assess Area of Impairment: Following commands, Safety/judgement, Awareness, Problem solving, Attention, Memory                   Current Attention Level: Sustained Memory: Decreased recall of precautions, Decreased short-term memory Following Commands: Follows one step commands inconsistently, Follows one step commands with increased time Safety/Judgement: Decreased awareness of safety, Decreased awareness of deficits Awareness: Intellectual Problem Solving: Slow processing, Decreased initiation, Difficulty sequencing, Requires verbal cues, Requires tactile cues General Comments: pt demonstrating improving ability to engage with therapist.  After introduction, pt voicing "I'm James Estrada".  Pt able to voice spouse and grandson via picture on L side, but requires increased time.  He is  scanning more towards L side and holding his L UE with his R, indicating improved L attention.  FOllows some 1 step commands but remains inconsistent. No carryover with sternal precautions Family reports he is brushing his teeth using R Hand and he was singing lyrics of songs upon exit. Poor motor planning still.        Exercises      General Comments General comments (skin integrity, edema, etc.): VSS on RA, family at side and supportive      Pertinent Vitals/Pain Pain Assessment Pain Assessment: Faces Faces Pain Scale: No hurt Pain Intervention(s): Monitored during session    Home Living                          Prior Function            PT Goals (current goals can now be found in the care plan section) Acute Rehab PT Goals Patient Stated Goal: agreeable to session PT Goal Formulation: With patient/family Time For Goal Achievement: 10/07/22 Potential to Achieve Goals: Fair Progress towards PT goals: Progressing toward goals    Frequency    Min 4X/week      PT Plan Current plan remains appropriate    Co-evaluation PT/OT/SLP Co-Evaluation/Treatment: Yes Reason for Co-Treatment: Complexity of the patient's impairments (multi-system involvement);Necessary to address cognition/behavior during functional activity;For patient/therapist safety;To address functional/ADL transfers PT goals addressed during session: Mobility/safety with mobility;Proper use of DME;Balance OT goals addressed during session: ADL's and self-care      AM-PAC PT "6 Clicks" Mobility   Outcome Measure  Help needed turning from your back to your side while in a flat bed without using bedrails?: A Lot (mod cues) Help needed moving from lying on your back to sitting on the side of a flat bed without  using bedrails?: Total Help needed moving to and from a bed to a chair (including a wheelchair)?: Total Help needed standing up from a chair using your arms (e.g., wheelchair or bedside  chair)?: Total Help needed to walk in hospital room?: Total Help needed climbing 3-5 steps with a railing? : Total 6 Click Score: 7    End of Session Equipment Utilized During Treatment: Gait belt Activity Tolerance: Patient tolerated treatment well Patient left: in chair;with call bell/phone within reach;with chair alarm set;with family/visitor present   PT Visit Diagnosis: Other abnormalities of gait and mobility (R26.89);Muscle weakness (generalized) (M62.81);Other symptoms and signs involving the nervous system (R29.898);Unsteadiness on feet (R26.81);Difficulty in walking, not elsewhere classified (R26.2);Hemiplegia and hemiparesis Hemiplegia - Right/Left: Left Hemiplegia - dominant/non-dominant: Dominant Hemiplegia - caused by: Cerebral infarction     Time: [id number] PT Time Calculation (min) (ACUTE ONLY): 28 min  Charges:  $Therapeutic Activity: 8-22 mins                     Moishe Spice, PT, DPT Acute Rehabilitation Services  Office: [place] 10/03/2022, 1:19 PM

## 2022-10-03 NOTE — Progress Notes (Addendum)
      [address]       [place] [address]             [phone]      16 Days Post-Op Procedure(s) (LRB): REPAIR OF ACUTE ASCENDING THORACIC AORTIC DISSECTION USING 28 MM HEMASHIELD PLATINUM VASCULAR GRAFT (N/A)  Subjective:  Patient sitting up in bed eating breakfast. Moving all extremities, left is weaker than right.  No further diarrhea.  Objective: Vital signs in last 24 hours: Temp:  [97.4 F (36.3 C)-98.8 F (37.1 C)] 98.2 F (36.8 C) (11/20 0325) Pulse Rate:  [64-70] 70 (11/20 0500) Cardiac Rhythm: Normal sinus rhythm (11/19 1903) Resp:  [16-34] 17 (11/20 0500) BP: (104-133)/(51-63) 119/56 (11/20 0325) SpO2:  [94 %-97 %] 97 % (11/20 0500) Weight:  [65 kg] 65 kg (11/20 0500)  Intake/Output from previous day: 11/19 0701 - 11/20 0700 In: 1090.3 [P.O.:240; I.V.:720.6; IV Piggyback:129.7] Out: 5000 [Urine:5000]  General appearance: alert, cooperative, and no distress Heart: regular rate and rhythm Lungs: clear to auscultation bilaterally Abdomen: soft, non-tender; bowel sounds normal; no masses,  no organomegaly Extremities: extremities normal, atraumatic, no cyanosis or edema Wound: clean, healing without evidence of infection Neuro: Left Extremity weakness, some mild aphasia  Lab Results: Recent Labs    10/02/22 0559 10/02/22 1739  WBC 20.8* 20.5*  HGB 7.9* 8.8*  HCT 24.7* 28.2*  PLT 250 314   BMET:  Recent Labs    10/02/22 0559 10/02/22 1739  NA 140 136  K 4.1 4.1  CL 106 101  CO2 23 26  GLUCOSE 124* 135*  BUN 44* 38*  CREATININE 1.55* 1.81*  CALCIUM 8.1* 8.1*    PT/INR: No results for input(s): "LABPROT", "INR" in the last 72 hours. ABG    Component Value Date/Time   PHART 7.479 (H) 09/20/2022 0830   HCO3 25.2 09/20/2022 0830   TCO2 26 09/20/2022 0830   ACIDBASEDEF 1.0 09/18/2022 1629   O2SAT 94 09/20/2022 0830   CBG (last 3)  Recent Labs    10/02/22 1710 10/02/22 2116 10/03/22 0642  GLUCAP 97 141* 126*     Assessment/Plan: S/P Procedure(s) (LRB): REPAIR OF ACUTE ASCENDING THORACIC AORTIC DISSECTION USING 28 MM HEMASHIELD PLATINUM VASCULAR GRAFT (N/A)  CV- Post operative A. Fib, currently in NSR- on Lopressor, Amiodarone Pulm- off oxygen, no acute issues,  Renal- AKI due to development of urinary retention over the weekend- foley is back in place.. flomax has also been initiated ID- leukocytosis up to 51.8, started on empiric ABX.Marland Kitchen down to 20.5 today on Zosyn GI- diarrhea resolved Dysphagia- working with SLP, continue current diet will advance per SLP recommendations Deconditioning- post stroke, Aortic Dissection repair.. continue PT/OT, SLP... will require CIR at discharge, hopefully this week   LOS: 16 days    Lowella Dandy, PA-C 10/03/2022  Patient seen and examined, agree with above He diuresed 5L yesterday after IV Lasix Creatinine up slightly from 1.5 to 1.8 Leave Foley in place today Will decrease amiodarone to 200 BID Recognized his daughter and called her by name immediately which is a first Moving left arm more but still weak  Viviann Spare C. Dorris Fetch, MD [facility] [phone]  Probably ready for CIR later this week if continues to improve

## 2022-10-03 NOTE — Progress Notes (Signed)
Inpatient Rehabilitation Admissions Coordinator   I met with patient and wife at bedside. I await medical readiness to admit to CIR. I will update [employer] on his progress after I receive updated therapy notes today.  Danne Baxter, RN, MSN Rehab Admissions Coordinator [phone] 10/03/2022 10:59 AM

## 2022-10-03 NOTE — Progress Notes (Signed)
Pt having increased moments of frustration with Physical and Verbal limitations  Attempted multiple times this evening to ambulate on his own, Pt family, NT and RN assisted him back to the bed from the chair, and back in the bed when the pt would try to ambulate again.  Pt was not combative and has been pleasant this shift. Pt expressed the desire to ambulate on his own and understood that it will take time.  Will continue to monitor and update

## 2022-10-03 NOTE — Care Management Important Message (Signed)
Important Message  Patient Details  Name: James Estrada MRN: [id number] Date of Birth: October 09, 1941   Medicare Important Message Given:        Renie Ora 10/03/2022, 10:22 AM

## 2022-10-03 NOTE — Progress Notes (Addendum)
Occupational Therapy Treatment Patient Details Name: James Estrada MRN: [id number] DOB: 12/27/1940 Today's Date: 10/03/2022   History of present illness Pt is a 81 y/o M admitted to [facility] on 09/17/22 with BLE weakness, chest + back pain, admitted with CT angio shows type dissection with thrombosed false lumen. Repair of type 1 aortic dissection performed 11/4. Head CT 11/6 acute infarcts within R MCA involvement, including R cerebellum, basal ganglia, frontal lobe. ETT 11/4-11/9. PMH AAA s/p endovascular repair (2020), HTN, HLD, GERD.   OT comments  Patient progressing well towards OT goals.  Patient continues to be limited by impaired communication/cognition, L sided weakness, L inattention, balance, and motor planning. He requires cueing throughout session to follow sternal precautions.  He complete bed mobility with min assist +2, sit to stand at EOB with mod assist +2 (preference to RW) but step pivots to recliner with max assist +2 for weightlifting, balance and progression of LEs. He demonstrates improved attention to L side today, but continues with preference to utilize R UE during functional tasks.  Requires total assist for LB ADLs.  Midline visual attention maintained and scanning to L side today, further visual assessment required. Continues to require 1 step commands with increased time, but requires full attention to follow. Will follow acutely, continue to recommend AIR at dc.    Recommendations for follow up therapy are one component of a multi-disciplinary discharge planning process, led by the attending physician.  Recommendations may be updated based on patient status, additional functional criteria and insurance authorization.    Follow Up Recommendations  Acute inpatient rehab (3hours/day)     Assistance Recommended at Discharge Frequent or constant Supervision/Assistance  Patient can return home with the following  Two people to help with walking and/or transfers;Two people  to help with bathing/dressing/bathroom;Assistance with cooking/housework;Assistance with feeding;Direct supervision/assist for medications management;Direct supervision/assist for financial management;Assist for transportation;Help with stairs or ramp for entrance   Equipment Recommendations  Other (comment) (defer)    Recommendations for Other Services      Precautions / Restrictions Precautions Precautions: Fall;Other (comment);Sternal Precaution Booklet Issued: No Precaution Comments: fecal incontinence, L inattention Restrictions Weight Bearing Restrictions: Yes Other Position/Activity Restrictions: sternal precautions       Mobility Bed Mobility Overal bed mobility: Needs Assistance Bed Mobility: Rolling, Sidelying to Sit, Sit to Supine Rolling: Min assist Sidelying to sit: Min assist, +2 for physical assistance, +2 for safety/equipment       General bed mobility comments: min assist for rolling and seqeucing sidelying to sitting. cueing for technique and increased time required to process.    Transfers Overall transfer level: Needs assistance Equipment used: Rolling walker (2 wheels), 2 person hand held assist Transfers: Sit to/from Stand, Bed to chair/wheelchair/BSC Sit to Stand: Mod assist, +2 physical assistance, +2 safety/equipment Stand pivot transfers: Max assist, +2 physical assistance, +2 safety/equipment   Step pivot transfers: Max assist, +2 physical assistance, +2 safety/equipment     General transfer comment: pt standing at EOB mulitple times with mod assist +2, utilized RW and bilateral hand held assist but difficulty sequencing/motor planning stepping with LEs, max asisst +2 to weightshift and advance LEs and maintain balance to reach chair.     Balance Overall balance assessment: Needs assistance Sitting-balance support: Single extremity supported, Bilateral upper extremity supported, No upper extremity supported, Feet supported Sitting balance-Leahy  Scale: Fair Sitting balance - Comments: min guard to close supervision   Standing balance support: Bilateral upper extremity supported, During functional activity Standing balance-Leahy Scale:  Poor Standing balance comment: relies on BUE and external support                           ADL either performed or assessed with clinical judgement   ADL Overall ADL's : Needs assistance/impaired     Grooming: Minimal assistance;Sitting;Wash/dry face Grooming Details (indicate cue type and reason): washing face with R UE, hand over hand to engage L UE             Lower Body Dressing: Total assistance;+2 for physical assistance;+2 for safety/equipment;Sit to/from stand Lower Body Dressing Details (indicate cue type and reason): assist to don socks, +2 in standing Toilet Transfer: Maximal assistance;+2 for physical assistance;+2 for safety/equipment Toilet Transfer Details (indicate cue type and reason): simulated bed to chair, mod assist +2 to stand but requires max assist to sequence, motor plan to pivot to recliner .         Functional mobility during ADLs: Moderate assistance;Maximal assistance;+2 for physical assistance;+2 for safety/equipment;Cueing for sequencing      Extremity/Trunk Assessment              Vision   Additional Comments: crossing midline to locate therapist and family member, some decreased attention and will need further assessment   Perception     Praxis      Cognition Arousal/Alertness: Awake/alert Behavior During Therapy: Flat affect Overall Cognitive Status: Difficult to assess Area of Impairment: Following commands, Safety/judgement, Awareness, Problem solving, Attention, Memory                   Current Attention Level: Sustained Memory: Decreased recall of precautions, Decreased short-term memory Following Commands: Follows one step commands inconsistently, Follows one step commands with increased time Safety/Judgement:  Decreased awareness of safety, Decreased awareness of deficits Awareness: Intellectual Problem Solving: Slow processing, Decreased initiation, Difficulty sequencing, Requires verbal cues, Requires tactile cues General Comments: pt demonstrating improving ability to engage with therapist.  After introduction, pt voicing "I'm [name]".  Pt able to voice spouse and grandson via picture on L side, but requires increased time.  He is scanning more towards L side.  FOllows some 1 step commands but remains inconsistent. No carryover with sternal precautions Family reports he is brushing his teeth using R Hand and he was singing lyrics of songs upon exit.        Exercises      Shoulder Instructions       General Comments VSS on RA, family at side and supportive    Pertinent Vitals/ Pain       Pain Assessment Pain Assessment: Faces Faces Pain Scale: No hurt  Home Living                                          Prior Functioning/Environment              Frequency  Min 3X/week        Progress Toward Goals  OT Goals(current goals can now be found in the care plan section)  Progress towards OT goals: Progressing toward goals  Acute Rehab OT Goals OT Goal Formulation: With family Time For Goal Achievement: 10/07/22 Potential to Achieve Goals: Good  Plan Discharge plan remains appropriate;Frequency remains appropriate    Co-evaluation    PT/OT/SLP Co-Evaluation/Treatment: Yes Reason for Co-Treatment: Complexity of the patient's impairments (multi-system involvement)  OT goals addressed during session: ADL's and self-care      AM-PAC OT "6 Clicks" Daily Activity     Outcome Measure   Help from another person eating meals?: A Lot Help from another person taking care of personal grooming?: A Lot Help from another person toileting, which includes using toliet, bedpan, or urinal?: A Lot Help from another person bathing (including washing, rinsing,  drying)?: A Lot Help from another person to put on and taking off regular upper body clothing?: A Lot Help from another person to put on and taking off regular lower body clothing?: Total 6 Click Score: 11    End of Session Equipment Utilized During Treatment: Gait belt;Rolling walker (2 wheels)  OT Visit Diagnosis: Unsteadiness on feet (R26.81);Muscle weakness (generalized) (M62.81);Hemiplegia and hemiparesis;Other symptoms and signs involving cognitive function Hemiplegia - Right/Left: Left Hemiplegia - dominant/non-dominant: Non-Dominant Hemiplegia - caused by: Cerebral infarction   Activity Tolerance Patient tolerated treatment well   Patient Left in chair;with call bell/phone within reach;with chair alarm set;with family/visitor present   Nurse Communication Mobility status        Time: 2841-3244 OT Time Calculation (min): 29 min  Charges: OT General Charges $OT Visit: 1 Visit OT Treatments $Self Care/Home Management : 8-22 mins  Barry Brunner, OT Acute Rehabilitation Services Office [phone]   Chancy Milroy 10/03/2022, 12:12 PM

## 2022-10-03 NOTE — Progress Notes (Addendum)
Advanced Heart Failure Rounding Note  PCP-Cardiologist: None   Subjective:    Developed sepsis and AKI on 11/17. Also had urinary retention and Foley placed. Treated empirically for sepsis and c.diff.   Yesterday abx narrowed, Vancomycin and Dificid stopped as felt C-diff less likely. Zosyn continued. Today, WBC unchanged past 24 hrs, 20K. AF. SCr back up, 1.55>>1.81.   C/w foley. Sitting up in bed eating breakfast. No complaints. Continues to improve slowly from stroke.   Today's wt not accurate. Will ask RN to re weigh.     Objective:   Weight Range: 65 kg Body mass index is 19.45 kg/m.   Vital Signs:   Temp:  [97.4 F (36.3 C)-98.8 F (37.1 C)] 98.2 F (36.8 C) (11/20 0325) Pulse Rate:  [64-70] 70 (11/20 0500) Resp:  [16-34] 17 (11/20 0500) BP: (104-133)/(51-63) 119/56 (11/20 0325) SpO2:  [94 %-97 %] 97 % (11/20 0500) Weight:  [65 kg] 65 kg (11/20 0500) Last BM Date : 09/30/22  Weight change: Filed Weights   10/01/22 0500 10/02/22 0500 10/03/22 0500  Weight: 87.1 kg 87.1 kg 65 kg    Intake/Output:   Intake/Output Summary (Last 24 hours) at 10/03/2022 0711 Last data filed at 10/03/2022 0600 Gross per 24 hour  Intake 1090.28 ml  Output 5000 ml  Net -3909.72 ml      Physical Exam   General:  Well appearing, elderly male. No respiratory difficulty HEENT: normal Neck: supple. no JVD. Carotids 2+ bilat; no bruits. No lymphadenopathy or thyromegaly appreciated. Cor: PMI nondisplaced. Regular rate & rhythm. No rubs, gallops or murmurs. Sternotomy site ok. Lungs: decreased BS at the bases, no wheezing  Abdomen: soft, nontender, nondistended. No hepatosplenomegaly. No bruits or masses. Good bowel sounds. Extremities: no cyanosis, clubbing, rash, edema Neuro: alert & oriented x 3, cranial nerves grossly intact. moves all 4 extremities w/o difficulty. Affect pleasant. Alert. Moves all 4 ext, weak on left side, + expressive and receptive aphasia    Telemetry    NSR 70s Personally reviewed   Labs    CBC Recent Labs    10/02/22 0559 10/02/22 1739  WBC 20.8* 20.5*  HGB 7.9* 8.8*  HCT 24.7* 28.2*  MCV 89.5 90.7  PLT 250 314   Basic Metabolic Panel Recent Labs    63/01/60 0559 10/02/22 1739  NA 140 136  K 4.1 4.1  CL 106 101  CO2 23 26  GLUCOSE 124* 135*  BUN 44* 38*  CREATININE 1.55* 1.81*  CALCIUM 8.1* 8.1*   Liver Function Tests No results for input(s): "AST", "ALT", "ALKPHOS", "BILITOT", "PROT", "ALBUMIN" in the last 72 hours.  No results for input(s): "LIPASE", "AMYLASE" in the last 72 hours. Cardiac Enzymes No results for input(s): "CKTOTAL", "CKMB", "CKMBINDEX", "TROPONINI" in the last 72 hours.  BNP: BNP (last 3 results) No results for input(s): "BNP" in the last 8760 hours.  ProBNP (last 3 results) No results for input(s): "PROBNP" in the last 8760 hours.   D-Dimer No results for input(s): "DDIMER" in the last 72 hours.  Hemoglobin A1C No results for input(s): "HGBA1C" in the last 72 hours.  Fasting Lipid Panel No results for input(s): "CHOL", "HDL", "LDLCALC", "TRIG", "CHOLHDL", "LDLDIRECT" in the last 72 hours.  Thyroid Function Tests No results for input(s): "TSH", "T4TOTAL", "T3FREE", "THYROIDAB" in the last 72 hours.  Invalid input(s): "FREET3"  Other results:   Imaging    No results found.   Medications:     Scheduled Medications:  amiodarone  400 mg  Oral BID   aspirin  81 mg Oral Daily   atorvastatin  40 mg Oral Daily   Chlorhexidine Gluconate Cloth  6 each Topical Daily   [facility], Patient & Family Education   Does not apply Once   feeding supplement  237 mL Oral BID BM   insulin aspart  0-15 Units Subcutaneous TID WC   insulin aspart  0-5 Units Subcutaneous QHS   metoprolol tartrate  12.5 mg Oral BID   sodium chloride flush  3 mL Intravenous Q12H   tamsulosin  0.4 mg Oral Daily    Infusions:  sodium chloride 10 mL/hr at 10/01/22 0836   lactated ringers  100 mL/hr at 10/02/22 2303   piperacillin-tazobactam (ZOSYN)  IV 3.375 g (10/03/22 0541)    PRN Medications: sodium chloride, levalbuterol, ondansetron (ZOFRAN) IV, mouth rinse, sodium chloride flush, traMADol    Patient Profile   81 y/o male w/ h/o HTN, HLD, AAA s/p stent graft admitted w/ Type 1 Aortic Dissection, s/p emergent repair.   Assessment/Plan   1. Sepsis/possible c.diff - has responded to IVF/abx/dificid - cx negative - suspect major issue was urinary retention  - abx narrowed to zosyn - watch WBCs, if no downtrend may need to broaded abx   2. Type 1 Aortic Dissection  - s/p emergent repair 11/4 - Intra-op TEE EF 60-65%, RV ok   3. Hypertension  - losartan 50 mg/spiro 12.5/lopressor 25 bid on hold with sepsis/AKI  - lopressor restarted. Can restart others as BP requires   4. Post-Op PAF - had recurrent AF on 11/14. Now back in NSR on IV amio  - continue PO amio 400 bid - no AC with hemorrhagic component of stroke   5. Pericardial Effusion  - repeat limited echo 11/14 w/ trivial effusion    6. Acute Hypoxic Respiratory Failure - Extubated 11/9  - stable    7. Post-op CVA  - CT head 11/6 showed 3 small right-sided strokes 1 in the cerebellum 1 in the right basal ganglia and 1 in the frontal lobe with a 4 mm area of bleeding  - CTA - R carotid involved with dissection - to CIR this week  - PT/OT following. Passed swallow study   8. AKI  - Due to sepsis/urinary retention - Scr improved with resuscitation, but up overnight, 1.5>>1.8  - follow BMP   9. Urinary retention - Foley placed 11/17 - Flomax started - Will need voiding trials   Scr up after dose of IV Lasix yesterday, 1.5>>1.8. Today's wt not accurate, charted 192>>143 lb. Have asked RN to re-weigh. Hold diuretics for now.    Length of Stay: [address] PA-C  10/03/2022, 7:11 AM  Advanced Heart Failure Team Pager [phone] (M-F; 7a - 5p)  Please contact [facility] for  night-coverage after hours (5p -7a ) and weekends on [email or web address]  Patient seen and examined with the above-signed Advanced Practice Provider and/or Housestaff. I personally reviewed laboratory data, imaging studies and relevant notes. I independently examined the patient and formulated the important aspects of the plan. I have edited the note to reflect any of my changes or salient points. I have personally discussed the plan with the patient and/or family.  Still with aphasia. Denies CP or SOB, Remains in NSR. On abx. Afebrile  General:  Sitting in chair No resp difficulty HEENT: normal Neck: supple. no JVD. Carotids 2+ bilat; no bruits. No lymphadenopathy or thryomegaly appreciated. Cor: PMI nondisplaced. Regular rate & rhythm.  No rubs, gallops or murmurs. Lungs: clear Abdomen: soft, nontender, nondistended. No hepatosplenomegaly. No bruits or masses. Good bowel sounds. Extremities: no cyanosis, clubbing, rash, edema Neuro: alert follows some commands + aphasia. Left weak  Stable from cardiac perspective. Continue current regimen. To CIR once IV abx off.  Glori Bickers, MD  5:39 PM

## 2022-10-04 ENCOUNTER — Other Ambulatory Visit: Payer: Self-pay

## 2022-10-04 ENCOUNTER — Encounter (HOSPITAL_COMMUNITY): Payer: Self-pay | Admitting: Physical Medicine & Rehabilitation

## 2022-10-04 ENCOUNTER — Inpatient Hospital Stay (HOSPITAL_COMMUNITY)
Admission: RE | Admit: 2022-10-04 | Discharge: 2022-10-20 | DRG: 056 | Disposition: A | Discharge status: Home Health | Payer: Medicare Other | Source: Intra-hospital | Attending: Physical Medicine & Rehabilitation | Admitting: Physical Medicine & Rehabilitation

## 2022-10-04 DIAGNOSIS — R488 Other symbolic dysfunctions: Secondary | ICD-10-CM | POA: Diagnosis present

## 2022-10-04 DIAGNOSIS — R451 Restlessness and agitation: Secondary | ICD-10-CM | POA: Diagnosis present

## 2022-10-04 DIAGNOSIS — Z8249 Family history of ischemic heart disease and other diseases of the circulatory system: Secondary | ICD-10-CM | POA: Diagnosis not present

## 2022-10-04 DIAGNOSIS — I701 Atherosclerosis of renal artery: Secondary | ICD-10-CM | POA: Diagnosis not present

## 2022-10-04 DIAGNOSIS — I69320 Aphasia following cerebral infarction: Secondary | ICD-10-CM | POA: Diagnosis not present

## 2022-10-04 DIAGNOSIS — I7100 Dissection of unspecified site of aorta: Secondary | ICD-10-CM | POA: Diagnosis present

## 2022-10-04 DIAGNOSIS — Z9842 Cataract extraction status, left eye: Secondary | ICD-10-CM

## 2022-10-04 DIAGNOSIS — I63521 Cerebral infarction due to unspecified occlusion or stenosis of right anterior cerebral artery: Secondary | ICD-10-CM

## 2022-10-04 DIAGNOSIS — Z23 Encounter for immunization: Secondary | ICD-10-CM | POA: Diagnosis not present

## 2022-10-04 DIAGNOSIS — D62 Acute posthemorrhagic anemia: Secondary | ICD-10-CM | POA: Diagnosis not present

## 2022-10-04 DIAGNOSIS — E785 Hyperlipidemia, unspecified: Secondary | ICD-10-CM | POA: Diagnosis not present

## 2022-10-04 DIAGNOSIS — I110 Hypertensive heart disease with heart failure: Secondary | ICD-10-CM | POA: Diagnosis present

## 2022-10-04 DIAGNOSIS — I69392 Facial weakness following cerebral infarction: Secondary | ICD-10-CM | POA: Diagnosis not present

## 2022-10-04 DIAGNOSIS — Z833 Family history of diabetes mellitus: Secondary | ICD-10-CM | POA: Diagnosis not present

## 2022-10-04 DIAGNOSIS — D72823 Leukemoid reaction: Secondary | ICD-10-CM | POA: Diagnosis present

## 2022-10-04 DIAGNOSIS — N179 Acute kidney failure, unspecified: Secondary | ICD-10-CM | POA: Diagnosis not present

## 2022-10-04 DIAGNOSIS — I480 Paroxysmal atrial fibrillation: Secondary | ICD-10-CM | POA: Diagnosis not present

## 2022-10-04 DIAGNOSIS — Z9841 Cataract extraction status, right eye: Secondary | ICD-10-CM

## 2022-10-04 DIAGNOSIS — D696 Thrombocytopenia, unspecified: Secondary | ICD-10-CM | POA: Diagnosis not present

## 2022-10-04 DIAGNOSIS — I6601 Occlusion and stenosis of right middle cerebral artery: Secondary | ICD-10-CM | POA: Diagnosis not present

## 2022-10-04 DIAGNOSIS — I639 Cerebral infarction, unspecified: Secondary | ICD-10-CM | POA: Diagnosis present

## 2022-10-04 DIAGNOSIS — Z741 Need for assistance with personal care: Secondary | ICD-10-CM | POA: Diagnosis present

## 2022-10-04 DIAGNOSIS — E876 Hypokalemia: Secondary | ICD-10-CM | POA: Diagnosis not present

## 2022-10-04 DIAGNOSIS — K5900 Constipation, unspecified: Secondary | ICD-10-CM | POA: Diagnosis not present

## 2022-10-04 DIAGNOSIS — I63031 Cerebral infarction due to thrombosis of right carotid artery: Secondary | ICD-10-CM | POA: Diagnosis not present

## 2022-10-04 DIAGNOSIS — I10 Essential (primary) hypertension: Secondary | ICD-10-CM | POA: Diagnosis not present

## 2022-10-04 DIAGNOSIS — I69354 Hemiplegia and hemiparesis following cerebral infarction affecting left non-dominant side: Principal | ICD-10-CM

## 2022-10-04 DIAGNOSIS — F419 Anxiety disorder, unspecified: Secondary | ICD-10-CM | POA: Diagnosis present

## 2022-10-04 DIAGNOSIS — Z79899 Other long term (current) drug therapy: Secondary | ICD-10-CM

## 2022-10-04 DIAGNOSIS — I6319 Cerebral infarction due to embolism of other precerebral artery: Secondary | ICD-10-CM

## 2022-10-04 DIAGNOSIS — R410 Disorientation, unspecified: Secondary | ICD-10-CM | POA: Diagnosis present

## 2022-10-04 DIAGNOSIS — N170 Acute kidney failure with tubular necrosis: Secondary | ICD-10-CM | POA: Diagnosis present

## 2022-10-04 DIAGNOSIS — Z96641 Presence of right artificial hip joint: Secondary | ICD-10-CM | POA: Diagnosis present

## 2022-10-04 DIAGNOSIS — I5032 Chronic diastolic (congestive) heart failure: Secondary | ICD-10-CM | POA: Diagnosis present

## 2022-10-04 DIAGNOSIS — R339 Retention of urine, unspecified: Secondary | ICD-10-CM | POA: Diagnosis not present

## 2022-10-04 DIAGNOSIS — R1310 Dysphagia, unspecified: Secondary | ICD-10-CM | POA: Diagnosis present

## 2022-10-04 DIAGNOSIS — K219 Gastro-esophageal reflux disease without esophagitis: Secondary | ICD-10-CM | POA: Diagnosis not present

## 2022-10-04 DIAGNOSIS — I69391 Dysphagia following cerebral infarction: Secondary | ICD-10-CM

## 2022-10-04 DIAGNOSIS — I69390 Apraxia following cerebral infarction: Secondary | ICD-10-CM

## 2022-10-04 DIAGNOSIS — R58 Hemorrhage, not elsewhere classified: Secondary | ICD-10-CM | POA: Diagnosis not present

## 2022-10-04 HISTORY — DX: Cerebral infarction, unspecified: I63.9

## 2022-10-04 LAB — BASIC METABOLIC PANEL
Anion gap: 9 (ref 5–15)
BUN: 28 mg/dL — ABNORMAL HIGH (ref 8–23)
CO2: 22 mmol/L (ref 22–32)
Calcium: 8.2 mg/dL — ABNORMAL LOW (ref 8.9–10.3)
Chloride: 107 mmol/L (ref 98–111)
Creatinine, Ser: 1.37 mg/dL — ABNORMAL HIGH (ref 0.61–1.24)
GFR, Estimated: 53 mL/min — ABNORMAL LOW (ref >60)
Glucose, Bld: 121 mg/dL — ABNORMAL HIGH (ref 70–99)
Potassium: 4 mmol/L (ref 3.5–5.1)
Sodium: 138 mmol/L (ref 135–145)

## 2022-10-04 LAB — CBC
HCT: 26 % — ABNORMAL LOW (ref 39.0–52.0)
Hemoglobin: 8.2 g/dL — ABNORMAL LOW (ref 13.0–17.0)
MCH: 28.3 pg (ref 26.0–34.0)
MCHC: 31.5 g/dL (ref 30.0–36.0)
MCV: 89.7 fL (ref 80.0–100.0)
Platelets: 312 10*3/uL (ref 150–400)
RBC: 2.9 MIL/uL — ABNORMAL LOW (ref 4.22–5.81)
RDW: 14.7 % (ref 11.5–15.5)
WBC: 16.4 10*3/uL — ABNORMAL HIGH (ref 4.0–10.5)
nRBC: 0 % (ref 0.0–0.2)

## 2022-10-04 MED ORDER — ENSURE ENLIVE PO LIQD
237.0000 mL | Freq: Two times a day (BID) | ORAL | Status: DC
  Administered 2022-10-04 – 2022-10-20 (×20): 237 mL via ORAL
  Filled 2022-10-04: qty 237

## 2022-10-04 MED ORDER — TAMSULOSIN HCL 0.4 MG PO CAPS
0.4000 mg | ORAL_CAPSULE | Freq: Every day | ORAL | Status: DC
  Administered 2022-10-05: 0.4 mg via ORAL
  Filled 2022-10-04 (×2): qty 1

## 2022-10-04 MED ORDER — ALUM & MAG HYDROXIDE-SIMETH 200-200-20 MG/5ML PO SUSP: 30.0000 mL | ORAL | Status: DC | PRN

## 2022-10-04 MED ORDER — AMIODARONE HCL 200 MG PO TABS: 200.0000 mg | ORAL_TABLET | Freq: Two times a day (BID) | ORAL | Status: DC

## 2022-10-04 MED ORDER — TAMSULOSIN HCL 0.4 MG PO CAPS: 0.4000 mg | ORAL_CAPSULE | Freq: Every day | ORAL | Status: DC

## 2022-10-04 MED ORDER — SODIUM CHLORIDE 0.9% FLUSH
3.0000 mL | Freq: Two times a day (BID) | INTRAVENOUS | Status: DC
  Administered 2022-10-04 – 2022-10-05 (×3): 3 mL via INTRAVENOUS

## 2022-10-04 MED ORDER — METOPROLOL TARTRATE 25 MG PO TABS: 12.5000 mg | ORAL_TABLET | Freq: Two times a day (BID) | ORAL | 3 refills | Status: DC

## 2022-10-04 MED ORDER — AMIODARONE HCL 200 MG PO TABS
200.0000 mg | ORAL_TABLET | Freq: Every day | ORAL | Status: DC
  Administered 2022-10-11 – 2022-10-19 (×9): 200 mg via ORAL
  Filled 2022-10-04 (×9): qty 1

## 2022-10-04 MED ORDER — ASPIRIN 81 MG PO CHEW: 81.0000 mg | CHEWABLE_TABLET | Freq: Every day | ORAL | Status: DC

## 2022-10-04 MED ORDER — AMOXICILLIN-POT CLAVULANATE 875-125 MG PO TABS
1.0000 | ORAL_TABLET | Freq: Two times a day (BID) | ORAL | Status: AC
  Administered 2022-10-04 – 2022-10-14 (×20): 1 via ORAL
  Filled 2022-10-04 (×20): qty 1

## 2022-10-04 MED ORDER — FLEET ENEMA 7-19 GM/118ML RE ENEM: 1.0000 | ENEMA | Freq: Once | RECTAL | Status: DC | PRN

## 2022-10-04 MED ORDER — POLYETHYLENE GLYCOL 3350 17 G PO PACK
17.0000 g | PACK | Freq: Every day | ORAL | Status: DC | PRN
  Administered 2022-10-16: 17 g via ORAL
  Filled 2022-10-04: qty 1

## 2022-10-04 MED ORDER — GUAIFENESIN-DM 100-10 MG/5ML PO SYRP: 5.0000 mL | ORAL_SOLUTION | Freq: Four times a day (QID) | ORAL | Status: DC | PRN

## 2022-10-04 MED ORDER — METOPROLOL TARTRATE 12.5 MG HALF TABLET
12.5000 mg | ORAL_TABLET | Freq: Two times a day (BID) | ORAL | Status: DC
  Administered 2022-10-04 – 2022-10-20 (×32): 12.5 mg via ORAL
  Filled 2022-10-04 (×32): qty 1

## 2022-10-04 MED ORDER — DIPHENHYDRAMINE HCL 12.5 MG/5ML PO ELIX: 12.5000 mg | ORAL_SOLUTION | Freq: Four times a day (QID) | ORAL | Status: DC | PRN

## 2022-10-04 MED ORDER — PIPERACILLIN-TAZOBACTAM 3.375 G IVPB: 3.3750 g | Freq: Three times a day (TID) | INTRAVENOUS | Status: DC

## 2022-10-04 MED ORDER — ENSURE ENLIVE PO LIQD: 237.0000 mL | Freq: Two times a day (BID) | ORAL | 12 refills | Status: DC

## 2022-10-04 MED ORDER — ASPIRIN 81 MG PO CHEW
81.0000 mg | CHEWABLE_TABLET | Freq: Every day | ORAL | Status: DC
  Administered 2022-10-05 – 2022-10-20 (×16): 81 mg via ORAL
  Filled 2022-10-04 (×16): qty 1

## 2022-10-04 MED ORDER — AMOXICILLIN-POT CLAVULANATE 875-125 MG PO TABS: 1.0000 | ORAL_TABLET | Freq: Two times a day (BID) | ORAL | Status: DC

## 2022-10-04 MED ORDER — ATORVASTATIN CALCIUM 40 MG PO TABS
40.0000 mg | ORAL_TABLET | Freq: Every day | ORAL | Status: DC
  Administered 2022-10-05 – 2022-10-20 (×16): 40 mg via ORAL
  Filled 2022-10-04 (×16): qty 1

## 2022-10-04 MED ORDER — PROCHLORPERAZINE MALEATE 5 MG PO TABS: 5.0000 mg | ORAL_TABLET | Freq: Four times a day (QID) | ORAL | Status: DC | PRN

## 2022-10-04 MED ORDER — SODIUM CHLORIDE 0.9% FLUSH: 3.0000 mL | INTRAVENOUS | Status: DC | PRN

## 2022-10-04 MED ORDER — PROCHLORPERAZINE 25 MG RE SUPP: 12.5000 mg | Freq: Four times a day (QID) | RECTAL | Status: DC | PRN

## 2022-10-04 MED ORDER — TRAMADOL HCL 50 MG PO TABS: 50.0000 mg | ORAL_TABLET | ORAL | 0 refills | Status: DC | PRN

## 2022-10-04 MED ORDER — ATORVASTATIN CALCIUM 40 MG PO TABS: 40.0000 mg | ORAL_TABLET | Freq: Every day | ORAL | 3 refills | Status: DC

## 2022-10-04 MED ORDER — ACETAMINOPHEN 325 MG PO TABS
325.0000 mg | ORAL_TABLET | ORAL | Status: DC | PRN
  Administered 2022-10-09 – 2022-10-12 (×2): 650 mg via ORAL
  Filled 2022-10-04 (×3): qty 2

## 2022-10-04 MED ORDER — CHLORHEXIDINE GLUCONATE CLOTH 2 % EX PADS
6.0000 | MEDICATED_PAD | Freq: Every day | CUTANEOUS | Status: DC
  Administered 2022-10-05: 6 via TOPICAL

## 2022-10-04 MED ORDER — TRAZODONE HCL 50 MG PO TABS
25.0000 mg | ORAL_TABLET | Freq: Every evening | ORAL | Status: DC | PRN
  Administered 2022-10-04 – 2022-10-19 (×11): 50 mg via ORAL
  Filled 2022-10-04 (×10): qty 1

## 2022-10-04 MED ORDER — AMIODARONE HCL 200 MG PO TABS
200.0000 mg | ORAL_TABLET | Freq: Two times a day (BID) | ORAL | Status: AC
  Administered 2022-10-04 – 2022-10-11 (×14): 200 mg via ORAL
  Filled 2022-10-04 (×14): qty 1

## 2022-10-04 MED ORDER — SORBITOL 70 % SOLN
30.0000 mL | Freq: Every day | Status: DC | PRN
  Administered 2022-10-10: 30 mL via ORAL
  Filled 2022-10-04: qty 30

## 2022-10-04 MED ORDER — PROCHLORPERAZINE EDISYLATE 10 MG/2ML IJ SOLN: 5.0000 mg | Freq: Four times a day (QID) | INTRAMUSCULAR | Status: DC | PRN

## 2022-10-04 MED ORDER — SODIUM CHLORIDE 0.9 % IV SOLN: 250.0000 mL | INTRAVENOUS | Status: DC | PRN

## 2022-10-04 MED FILL — Heparin Sodium (Porcine) Inj 1000 Unit/ML: INTRAMUSCULAR | Qty: 10 | Status: AC

## 2022-10-04 MED FILL — Mannitol IV Soln 20%: INTRAVENOUS | Qty: 500 | Status: AC

## 2022-10-04 MED FILL — Sodium Chloride IV Soln 0.9%: INTRAVENOUS | Qty: 3000 | Status: AC

## 2022-10-04 MED FILL — Heparin Sodium (Porcine) Inj 1000 Unit/ML: INTRAMUSCULAR | Qty: 30 | Status: AC

## 2022-10-04 MED FILL — Sodium Bicarbonate IV Soln 8.4%: INTRAVENOUS | Qty: 150 | Status: AC

## 2022-10-04 MED FILL — Calcium Chloride Inj 10%: INTRAVENOUS | Qty: 10 | Status: AC

## 2022-10-04 MED FILL — Electrolyte-R (PH 7.4) Solution: INTRAVENOUS | Qty: 6000 | Status: AC

## 2022-10-04 NOTE — Progress Notes (Signed)
Inpatient Rehabilitation Admission Medication Review by a Pharmacist  A complete drug regimen review was completed for this patient to identify any potential clinically significant medication issues.  High Risk Drug Classes Is patient taking? Indication by Medication  Antipsychotic Yes Compazine-nausea  Anticoagulant No   Antibiotic Yes, by IV route Zosyn IV-sepsis  Opioid No   Antiplatelet Yes ASA-CVA  Hypoglycemics/insulin No   Vasoactive Medication Yes Metoprolol-HTN  Chemotherapy No   Other Yes Miralax-constipation Sorbitol-constipation Trazadone-insomnia Amiodarone-afib Atorvastatin-HLD Tamsulosin-unrinary retention Acetaminophen-pain     Type of Medication Issue Identified Description of Issue Recommendation(s)  Drug Interaction(s) (clinically significant)     Duplicate Therapy     Allergy     No Medication Administration End Date     Incorrect Dose     Additional Drug Therapy Needed     Significant med changes from prior encounter (inform family/care partners about these prior to discharge).    Other       Clinically significant medication issues were identified that warrant physician communication and completion of prescribed/recommended actions by midnight of the next day:  No  Name of provider notified for urgent issues identified:   Provider Method of Notification:     Pharmacist comments:   Time spent performing this drug regimen review (minutes):  20   Dwayne A. Jeanella Craze, PharmD, BCPS, FNKF Clinical Pharmacist [place] Please utilize Amion for appropriate phone number to reach the unit pharmacist [facility])  10/04/2022 1:09 PM  Updated by: Dixie Dials, Pharm.D., BCPS Clinical Pharmacist  **Pharmacist phone directory can be found on [email or web address] listed under [facility].  10/04/2022 5:10 PM

## 2022-10-04 NOTE — H&P (Signed)
Physical Medicine and Rehabilitation Admission H&P     CC: Functional deficits secondary to right embolic shower   HPI: James Estrada si a 81 year old L handed male who presented from home to the emergency department via EMS on 09/17/2022 with acute onset of bilateral leg weakness and left upper back pain.  The patient's medical history is significant for multiple joint replacements and abdominal aortic aneurysm status post stent graft placement July, 2021.  Neurology was consulted.  Stat CT examination of the head revealed no acute abnormality.  CT dissection protocol revealed acute aortic dissection with retroperitoneal blood adjacent to the left psoas muscle.  Emergent aortic dissection repair with effort to spare the artery supplying the spinal cord emergently necessary and the patient was admitted by Dr. Roxan Hockey.  The patient was counseled regarding emergent need of repair of type I dissection.  He underwent placement of 30 mm Hemashield graft by Dr. Roxan Hockey on 11/5 and was transferred to the ICU.  Rapid wean protocol initiated.  Hypertension hypokalemia addressed.  Elevated serum creatinine consistent with acute kidney injury.  No Lovenox given secondary to thrombocytopenia.  SCDs for DVT prophylaxis.  Advanced heart failure team consulted 11/6.  Remained on Cardene infusion for blood pressure control.  Aspirin and beta-blocker ordered.  Lasix infusion for diuresis.  Postoperative paroxysmal atrial fibrillation noted and placed on amiodarone infusion.  Neurology continued to follow the patient due to extremity weakness.  CT head performed 11/6 revealed 3 small right-sided strokes 1 in the cerebellum and 1 in the right basal ganglia as well as 1 in the frontal lobe with a 4 mm area of bleeding.  CTA without large vessel occlusion.  He exhibited left-sided weakness.  EEG performed.  Hyperglycemia treated and resolved.  Tube feedings initiated 11/7. Exhibited some agitation and was left  intubated.  PCCM consulted on 11/8 for assistance with ventilator weaning.  He was extubated on 11/9.  Lopressor started for blood pressure control.  MRI brain performed 11/11 which showed numerous bilateral foci of acute ischemia right greater than left and MCA and PCA territories as well as the cerebellum.  Neurology continued to follow the patient and recommends aspirin 81 mg daily and if atrial fibrillation recurs to consider anticoagulation.  No statin needed given that LDL is at goal.  Diuretics held 11/12.  He remained in normal sinus rhythm on amiodarone.  AKI likely secondary to ATN from hemorrhagic shock/hypotension continued to improve.  Blood pressure slowly climbed and permissive hypertension allowed until 11/14.  Losartan 25 mg daily then added.  Later that day, he went into atrial fibrillation with rapid ventricular response and oral amiodarone was discontinued and bolused and started on amiodarone infusion.  He converted back into sinus rhythm.  Core track tube placed.  Hemoglobin remained stable.  White blood cell count trended upward and urinalysis was negative.  Chest x-ray with bibasilar atelectasis versus infiltrate.  Treated with vancomycin and Zosyn.  Was found to have urinary retention and a Foley catheter was placed.  He experienced diarrhea and C. difficile toxin was negative.  Metoprolol continued 12.5 mg BID. Losartan and spironolactone started and discontinued. Modified barium swallow completed and p.o. intake initiated.  He is currently tolerating dysphagia 2 diet with thin liquids.  Surgical incisions are healing without signs of infection.  He is now on amiodarone 200 mg orally twice daily.  He will transition to Augmentin for 10 days.The patient requires inpatient physical medicine and rehabilitation evaluations and  treatment secondary to dysfunction due to embolic stroke.   Per wife and daughter, pt just finished therapy and is exhausted- doesn't know when had LBM, but denies  constipation. Has foley-  Denies pain, but then kept perseverating on saying "4/5/6"- so wasn't clear if had pain. Per family, has also been distressed/agitated, trying to keep getting out of bed- slightly better today, but was bad yesterday.       Review of Systems  Unable to perform ROS: Language (limited due to aphasia)        Past Medical History:  Diagnosis Date   AAA (abdominal aortic aneurysm) ([place])      last u/s done 07/18/17    Arthritis     Bradycardia     Dysrhythmia      frequent PAC, for 20 years   GERD (gastroesophageal reflux disease)      occ, OTC   Hemorrhoids     History of hiatal hernia     Hyperlipidemia     Hypertension     Seasonal allergies           Past Surgical History:  Procedure Laterality Date   ABDOMINAL AORTIC ENDOVASCULAR STENT GRAFT N/A 05/04/2020    Procedure: ABDOMINAL AORTIC ENDOVASCULAR STENT GRAFT;  Surgeon: [name], MD;  Location: [place];  Service: Vascular;  Laterality: N/A;   CATARACT EXTRACTION Bilateral 2009   DIAGNOSTIC LAPAROSCOPY        laparoscopic hernia repair   EYE SURGERY Bilateral      cataract removal   HERNIA REPAIR Bilateral 1999, 2006   JOINT REPLACEMENT Right ~2018    hip replacement   pheochromocytoma   1993   PROSTATECTOMY N/A 05/15/2013    Procedure: PROSTATECTOMY RETROPUBIC; SIMPLE OPEN PROSTATECTOMY;  Surgeon: [name], MD;  Location: [facility];  Service: Urology;  Laterality: N/A;   REPAIR OF ACUTE ASCENDING THORACIC AORTIC DISSECTION N/A 09/17/2022    Procedure: REPAIR OF ACUTE ASCENDING THORACIC AORTIC DISSECTION USING 28 MM HEMASHIELD PLATINUM VASCULAR GRAFT;  Surgeon: [name], MD;  Location: [place];  Service: Vascular;  Laterality: N/A;  Median sternotomy   TOTAL ELBOW REPLACEMENT Left      > 30 years ago   TOTAL HIP ARTHROPLASTY Right 09/08/2017    Procedure: RIGHT TOTAL HIP ARTHROPLASTY ANTERIOR APPROACH;  Surgeon: [name], MD;  Location: [facility];  Service: Orthopedics;   Laterality: Right;   ULTRASOUND GUIDANCE FOR VASCULAR ACCESS Bilateral 05/04/2020    Procedure: ULTRASOUND GUIDANCE FOR VASCULAR ACCESS;  Surgeon: [name], MD;  Location[facility];  Service: Vascular;  Laterality: Bilateral;         Family History  Problem Relation Age of Onset   Heart disease Mother     Coronary artery disease Mother     Aneurysm Father     Diabetes Sister      Social History:  reports that he has never smoked. He has never used smokeless tobacco. He reports current alcohol use. He reports that he does not use drugs. Allergies: No Known Allergies No medications prior to admission.          Home: Home Living Family/patient expects to be discharged to:: Private residence Living Arrangements: Spouse/significant other Available Help at Discharge: Family, Available 24 hours/day Type of Home: House Home Access: Stairs to enter [employer] of Steps: 3 Home Layout: Two level, Able to live on main level with bedroom/bathroom Bathroom Shower/Tub: Chiropodist: Standard Bathroom Accessibility: Yes Home  Equipment: None Additional Comments: pt lives in multilevel home; daughter Marisue Ivan) states pt would likely d/c to her home so he could live on one level and she can provide 24/7 assist as needed (home set-up of daughter's home; she works in Research officer, political party and has flexible hours)  Lives With: Spouse   Functional History: Prior Function Prior Level of Function : Independent/Modified Independent, Working/employed, Art gallery manager Comments: 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 retired Cytogeneticist, served in [place] ADLs Comments: Independent without DME. Wife does majority of household tasks; has housekeeper   Functional Status:  Mobility: Bed Mobility Overal bed mobility: Needs Assistance Bed Mobility: Rolling,  Sidelying to Sit, Sit to Supine Rolling: Min assist Sidelying to sit: Min assist, +2 for physical assistance, +2 for safety/equipment Supine to sit: Max assist, +2 for physical assistance Sit to supine: Max assist, +2 for physical assistance, +2 for safety/equipment, HOB elevated General bed mobility comments: min assist for rolling and seqeucing sidelying to sitting. cueing for technique and increased time required to process. Transfers Overall transfer level: Needs assistance Equipment used: Rolling walker (2 wheels), 2 person hand held assist Transfers: Sit to/from Stand, Bed to chair/wheelchair/BSC Sit to Stand: Mod assist, +2 physical assistance, +2 safety/equipment Bed to/from chair/wheelchair/BSC transfer type:: Step pivot Stand pivot transfers: Max assist, +2 physical assistance, +2 safety/equipment Squat pivot transfers: Mod assist, +2 physical assistance, +2 safety/equipment Step pivot transfers: Max assist, +2 physical assistance, +2 safety/equipment General transfer comment: pt standing at EOB mulitple times with mod assist +2, utilized RW and bilateral hand held assist but difficulty sequencing/motor planning stepping with LEs, max asisst +2 to weightshift and advance LEs and maintain balance to reach chair with bil HHA. L knee instability noted, needing blocking intermittently, but much less frequent buckling and no hyperextension noted today Ambulation/Gait Ambulation/Gait assistance: Max assist, +2 physical assistance, +2 safety/equipment Gait Distance (Feet): 1 Feet Assistive device: 2 person hand held assist Gait Pattern/deviations: Step-to pattern, Decreased stride length, Decreased weight shift to right, Decreased weight shift to left, Knees buckling, Trunk flexed, Leaning posteriorly General Gait Details: max asisst +2 to weightshift and advance LEs and maintain balance to reach chair with bil HHA. L knee instability noted, needing blocking intermittently, but much less  frequent buckling and no hyperextension noted today Gait velocity: reduced Gait velocity interpretation: <1.31 ft/sec, indicative of household ambulator Pre-gait activities: ACE wrap applied to L knee to prevent hyperextension, success noted. Practiced lateral weight shifting with noted L quads activation intermittently when shifting to L and VCs provided to stand up tall or straighten knees and less knee block provided. Practiced stepping anterior and posterior with L foot 1x, cuing and providing maxAx2 to weight shift to R and move L leg. Practiced stepping to R to [facility] 1-2 steps each foot, maxAx2 to weight shift, prevent posterior LOB, and manage L leg.   ADL: ADL Overall ADL's : Needs assistance/impaired Grooming: Minimal assistance, Sitting, Wash/dry face Grooming Details (indicate cue type and reason): washing face with R UE, hand over hand to engage L UE Lower Body Dressing: Total assistance, +2 for physical assistance, +2 for safety/equipment, Sit to/from stand Lower Body Dressing Details (indicate cue type and reason): assist to don socks, +2 in standing Toilet Transfer: Maximal assistance, +2 for physical assistance, +2 for safety/equipment Toilet Transfer Details (indicate cue type and reason): simulated bed to chair, mod assist +2 to stand  but requires max assist to sequence, motor plan to pivot to recliner . Toileting- Clothing Manipulation and Hygiene: Maximal assistance, +2 for safety/equipment, +2 for physical assistance Toileting - Clothing Manipulation Details (indicate cue type and reason): incontinent upon standing, max A +2 for standing balance and hygiene Functional mobility during ADLs: Moderate assistance, Maximal assistance, +2 for physical assistance, +2 for safety/equipment, Cueing for sequencing General ADL Comments: session limited by incontinent BM. R field occulsion glasses donned with some improvement noted in midline gaze   Cognition: Cognition Overall Cognitive  Status: Difficult to assess Arousal/Alertness: Awake/alert Orientation Level: Oriented X4 Attention: Focused, Sustained Focused Attention: Appears intact Sustained Attention: Impaired Sustained Attention Impairment: Verbal basic, Functional basic Memory: Impaired Memory Impairment: Retrieval deficit Awareness: Impaired Awareness Impairment: Intellectual impairment Cognition Arousal/Alertness: Awake/alert Behavior During Therapy: Flat affect Overall Cognitive Status: Difficult to assess Area of Impairment: Following commands, Safety/judgement, Awareness, Problem solving, Attention, Memory Current Attention Level: Sustained Memory: Decreased recall of precautions, Decreased short-term memory Following Commands: Follows one step commands inconsistently, Follows one step commands with increased time Safety/Judgement: Decreased awareness of safety, Decreased awareness of deficits Awareness: Intellectual Problem Solving: Slow processing, Decreased initiation, Difficulty sequencing, Requires verbal cues, Requires tactile cues General Comments: pt demonstrating improving ability to engage with therapist.  After introduction, pt voicing "I'm [name]".  Pt able to voice spouse and grandson via picture on L side, but requires increased time.  He is scanning more towards L side and holding his L UE with his R, indicating improved L attention.  FOllows some 1 step commands but remains inconsistent. No carryover with sternal precautions Family reports he is brushing his teeth using R Hand and he was singing lyrics of songs upon exit. Poor motor planning still. Difficult to assess due to: Impaired communication   Physical Exam: Blood pressure 122/62, pulse 66, temperature 98.1 F (36.7 C), temperature source Oral, resp. rate 17, height 6' (1.829 m), weight 87.4 kg, SpO2 98 %. Physical Exam Vitals and nursing note reviewed. Exam conducted with a chaperone present.  Constitutional:      General: He is  not in acute distress.    Comments: Pt sitting up in bedside chair- kept closing eyes; difficult to participate in exam, daughter and wife at bedside, NAD  HENT:     Head: Normocephalic and atraumatic.     Comments: L facial droop- mild Tongue midline- but furrowed Sensation decrease don L side of face    Right Ear: External ear normal.     Left Ear: External ear normal.     Nose: Nose normal. No congestion.     Mouth/Throat:     Mouth: Mucous membranes are dry.     Pharynx: Oropharynx is clear. No oropharyngeal exudate.  Eyes:     General:        Right eye: No discharge.        Left eye: No discharge.     Pupils: Pupils are equal, round, and reactive to light.     Comments: R gaze preference; difficulty looking left  Cardiovascular:     Rate and Rhythm: Normal rate and regular rhythm.     Heart sounds: Normal heart sounds. No murmur heard.    No gallop.  Pulmonary:     Effort: Pulmonary effort is normal. No respiratory distress.     Breath sounds: Normal breath sounds. No stridor. No wheezing or rales.  Abdominal:     General: Bowel sounds are normal. There is no distension.  Palpations: Abdomen is soft.     Tenderness: There is no abdominal tenderness.  Genitourinary:    Comments: Has condom cath in place- medium amber urine Musculoskeletal:     Cervical back: Neck supple. No tenderness.     Right lower leg: No edema.     Left lower leg: No edema.     Comments: Difficulty following commands LUE biceps 4/5; grip 3+/5-  RUE 5-/5 in same muscles LLE- DF 4/5 and PF 4/5 RLE 5/5 in DF and PF  Skin:    General: Skin is warm and dry.     Comments: IV R forearm= wrapped in kerlex - looks OK  Neurological:     Mental Status: He is alert.     Comments: Pt could only repeat what I said, except perseverating on "4/5/6" when asked where he was.  Very perseverative during motor exam- couldn't move onto next task even 1-2 minutes later Decreased to L touch on L side Can repeat  what is asked L inattention/mild-moderate neglect  Psychiatric:     Comments: Flat, sleepy        Lab Results Last 48 Hours        Results for orders placed or performed during the hospital encounter of 09/17/22 (from the past 48 hour(s))  Glucose, capillary     Status: Abnormal    Collection Time: 10/02/22 11:33 AM  Result Value Ref Range    Glucose-Capillary 119 (H) 70 - 99 mg/dL      Comment: Glucose reference range applies only to samples taken after fasting for at least 8 hours.  Glucose, capillary     Status: None    Collection Time: 10/02/22  5:10 PM  Result Value Ref Range    Glucose-Capillary 97 70 - 99 mg/dL      Comment: Glucose reference range applies only to samples taken after fasting for at least 8 hours.  Basic metabolic panel     Status: Abnormal    Collection Time: 10/02/22  5:39 PM  Result Value Ref Range    Sodium 136 135 - 145 mmol/L    Potassium 4.1 3.5 - 5.1 mmol/L    Chloride 101 98 - 111 mmol/L    CO2 26 22 - 32 mmol/L    Glucose, Bld 135 (H) 70 - 99 mg/dL      Comment: Glucose reference range applies only to samples taken after fasting for at least 8 hours.    BUN 38 (H) 8 - 23 mg/dL    Creatinine, Ser 1.81 (H) 0.61 - 1.24 mg/dL    Calcium 8.1 (L) 8.9 - 10.3 mg/dL    GFR, Estimated 38 (L) >60 mL/min      Comment: (NOTE) Calculated using the CKD-EPI Creatinine Equation (2021)      Anion gap 9 5 - 15      Comment: Performed at [facility] [address], [place], [place] [address]  CBC     Status: Abnormal    Collection Time: 10/02/22  5:39 PM  Result Value Ref Range    WBC 20.5 (H) 4.0 - 10.5 K/uL    RBC 3.11 (L) 4.22 - 5.81 MIL/uL    Hemoglobin 8.8 (L) 13.0 - 17.0 g/dL    HCT 28.2 (L) 39.0 - 52.0 %    MCV 90.7 80.0 - 100.0 fL    MCH 28.3 26.0 - 34.0 pg    MCHC 31.2 30.0 - 36.0 g/dL    RDW 15.1 11.5 - 15.5 %  Platelets 314 150 - 400 K/uL    nRBC 0.0 0.0 - 0.2 %      Comment: Performed at [facility], [address],  [place], [place] [address]  Glucose, capillary     Status: Abnormal    Collection Time: 10/02/22  9:16 PM  Result Value Ref Range    Glucose-Capillary 141 (H) 70 - 99 mg/dL      Comment: Glucose reference range applies only to samples taken after fasting for at least 8 hours.  Glucose, capillary     Status: Abnormal    Collection Time: 10/03/22  6:42 AM  Result Value Ref Range    Glucose-Capillary 126 (H) 70 - 99 mg/dL      Comment: Glucose reference range applies only to samples taken after fasting for at least 8 hours.  Basic metabolic panel     Status: Abnormal    Collection Time: 10/04/22 12:52 AM  Result Value Ref Range    Sodium 138 135 - 145 mmol/L    Potassium 4.0 3.5 - 5.1 mmol/L    Chloride 107 98 - 111 mmol/L    CO2 22 22 - 32 mmol/L    Glucose, Bld 121 (H) 70 - 99 mg/dL      Comment: Glucose reference range applies only to samples taken after fasting for at least 8 hours.    BUN 28 (H) 8 - 23 mg/dL    Creatinine, Ser 1.91 (H) 0.61 - 1.24 mg/dL    Calcium 8.2 (L) 8.9 - 10.3 mg/dL    GFR, Estimated 53 (L) >60 mL/min      Comment: (NOTE) Calculated using the CKD-EPI Creatinine Equation (2021)      Anion gap 9 5 - 15      Comment: Performed at [facility], [address], [place], [place] [address]  CBC     Status: Abnormal    Collection Time: 10/04/22 12:52 AM  Result Value Ref Range    WBC 16.4 (H) 4.0 - 10.5 K/uL    RBC 2.90 (L) 4.22 - 5.81 MIL/uL    Hemoglobin 8.2 (L) 13.0 - 17.0 g/dL    HCT 56.2 (L) 13.0 - 52.0 %    MCV 89.7 80.0 - 100.0 fL    MCH 28.3 26.0 - 34.0 pg    MCHC 31.5 30.0 - 36.0 g/dL    RDW 86.5 78.4 - 69.6 %    Platelets 312 150 - 400 K/uL    nRBC 0.0 0.0 - 0.2 %      Comment: Performed at [facility], [address] [place], [place] [address]      Imaging Results (Last 48 hours)  No results found.         Blood pressure 122/62, pulse 66, temperature 98.1 F (36.7 C), temperature source Oral, resp. rate 17, height 6' (1.829 m),  weight 87.4 kg, SpO2 98 %.   Medical Problem List and Plan: 1. Functional deficits secondary to R brain strokes x3- frontal, BG and cerebellum with L hemiparesis and aphasia s/p Aortic dissection             -patient may  shower             -ELOS/Goals: 12-14 days supervision- needs PT, OT and SLP- might have language center on R side 2.  Antithrombotics: -DVT/anticoagulation: If atrial fibrillation recurs, may need to consider [facility]             -antiplatelet therapy:  aspirin 81 mg daily             SCDs and TEDs; consider starting Lovenox 3. Pain Management: Tylenol as needed 4. Mood/Behavior/Sleep: LCSW to evaluate and provide emotional support             -antipsychotic agents: n/a 5. Neuropsych/cognition: This patient is not capable of making decisions on his own behalf. 6. Skin/Wound Care: Routine skin care checks  7. Fluids/Electrolytes/Nutrition: Strict Is and Os and follow-up chemistries             -daily weight             -continue dysphagia 2 diet/thins; SLP eval             -continue feeding supplement 8: Heart failure with pEF (see meds below)             -monitor Mg and K             -daily weight (weight range 65kg)             -follow-up with Dr. Haroldine Laws 9: Leukocytosis/leukemoid reaction: Zosyn to finish 11/21 -starting Augmentin BID for 10 days             -follow-up CBC 10: Post-op atrial fibrillation:  -continue amiodarone 200 mg BID for 7 days then 200 mg daily             -if recurs, consider AC 11: Left renal artery stenosis: follow-up as outpatient 12: Acute blood loss anemia: follow-up CBC 13: Hyperlipidemia: continue Lipitor 40 mg daily 14: GERD/HH: no meds (Pepcid discontinued) 15: Hypertension: montior TID and prn             -continue Lopressor 12.5 mg BID             -restart losartan 50 mg daily, Spironolactone 12.5 mg as BP allows 16: Acute urinary retention:             -has foley cath             -continue Flomax 0.4 mg daily 17: Acute kidney  injury: serum creatinine improving; follow-up BMP 18. Agitation/confusion/Negative mood- per family- didn't see myself- might need Seroquel vs Depakote to help with calming pt down   I have personally performed a face to face diagnostic evaluation of this patient and formulated the key components of the plan.  Additionally, I have personally reviewed laboratory data, imaging studies, as well as relevant notes and concur with the physician assistant's documentation above.   The patient's status has not changed from the original H&P.  Any changes in documentation from the acute care chart have been noted above.       Barbie Banner, PA-C 10/04/2022

## 2022-10-04 NOTE — Progress Notes (Signed)
Inpatient Rehabilitation  Patient information reviewed and entered into eRehab system by Tarius Stangelo M. Eleanor Dimichele, M.A., CCC/SLP, PPS Coordinator.  Information including medical coding, functional ability and quality indicators will be reviewed and updated through discharge.    

## 2022-10-04 NOTE — Progress Notes (Signed)
James Heys, MD  Physician CASE MANAGEMENT   PMR Pre-admission    Signed   Date of Service: 09/25/2022 12:17 PM  Related encounter: ED to Hosp-Admission (Current) from 09/17/2022 in [facility] 4E CV SURGICAL PROGRESSIVE CARE   Signed      Show:Clear all [x] Written[x] Templated[] Copied  Added by: [x] Cristina Gong, RN[x] Logue, Evalee Mutton, RN[x] James Heys, MD  [] Hover for details PMR Admission Coordinator Pre-Admission Assessment   Patient: James Estrada is an 81 y.o., male MRN: [id number] DOB: 09-04-1941 Height: 6' (182.9 cm) Weight: 87.4 kg   Insurance Information HMO: yes    PPO:      PCP:      IPA:      80/20:      OTHER:  PRIMARY: BCBS Medicare      Policy#: [id number]      Subscriber: self CM Name: Festus Barren      Phone#: [phone]     Fax#: [phone] Pre-Cert#: TBD   approved for 7 days   Employer: Retired Benefits:  Phone #: [phone]     Name: 11/13 Eff. Date: 11/14/21     Deduct: none      Out of Pocket Max: $3950      Life Max: none CIR: $335 co pay per day days 1 until 5     SNF: no co pay per day days 1 until 20; $196 co pay per day days 21 until 60, no c co pay days 61 until 100 Outpatient: $10 per visit     Co-Pay: visits per medical neccesity Home Health: 100%      Co-Pay: visits per medical necessity DME: 80%     Co-Pay: 20% Providers: in network  SECONDARY:       Policy#:      Phone#:    Development worker, community:       Phone#:    The Engineer, petroleum" for patients in Inpatient Rehabilitation Facilities with attached "Privacy Act [place] Records" was provided and verbally reviewed with: Patient and Family   Emergency Contact Information Contact Information       Name Relation Home Work Mobile    Wilkinson Spouse [phone]   [phone]    Herbie Drape Daughter     [phone]    Airrion, Vose     [phone]         Current Medical History  Patient Admitting Diagnosis: Aortic  dissection, postop CVA   History of Present Illness: 81 year old male with history of known AAA  s/p stent graft, arthritis, bradycardia, HLD and HTN presented on 09/17/22. Sudden onset of bilateral leg weakness and collapse with chest and left upper back pain. CT angio shows Type 1 AAA dissection.    Emergent repair on 09/17/22. Postoperative CT head on 11/6 showed 3 small right sided CVAs in cerebellum, right basal ganglia and frontal lobe with a 4 mm are of bleeding. CTA right carotid with dissection. Extubated on 09/22/22.    Heart Failure team consulted. Post operative PAF treated with IV amio and transitioned to PO. Felt no anticoagulation due to hemorrhagic component of Stroke. Pericardial effusion with repeat limited  echo 11/14 with trivial effusion. HTN treated with losartan and lopressor.    Developed sepsis on 11/17 that responded to IVF and antibiotics. Cultures negative. Wbcs as high as 51.8. Felt due to urinary retention and antibiotics narrowed to Zosyn. Foley placed 11/17, and began Flomax. Scr became elevated with sepsis that responded to Lasix.   Complete NIHSS TOTAL: 5  Patient's medical record from [facility] has been reviewed by the rehabilitation admission coordinator and physician.   Past Medical History      Past Medical History:  Diagnosis Date   AAA (abdominal aortic aneurysm) (HCC)      last u/s done 07/18/17    Arthritis     Bradycardia     Dysrhythmia      frequent PAC, for 20 years   GERD (gastroesophageal reflux disease)      occ, OTC   Hemorrhoids     History of hiatal hernia     Hyperlipidemia     Hypertension     Seasonal allergies      Has the patient had major surgery during 100 days prior to admission? Yes   Family History   family history includes Aneurysm in his father; Coronary artery disease in his mother; Diabetes in his sister; Heart disease in his mother.   Current Medications   Current Facility-Administered Medications:    0.9 %   sodium chloride infusion, 250 mL, Intravenous, PRN, Loreli Slot, MD, Last Rate: 10 mL/hr at 10/01/22 0836, Restarted at 10/01/22 0836   amiodarone (PACERONE) tablet 200 mg, 200 mg, Oral, BID, Loreli Slot, MD, 200 mg at 10/04/22 1122   aspirin chewable tablet 81 mg, 81 mg, Oral, Daily, Mosetta Anis, RPH, 81 mg at 10/04/22 1122   atorvastatin (LIPITOR) tablet 40 mg, 40 mg, Oral, Daily, Loreli Slot, MD, 40 mg at 10/04/22 1122   Chlorhexidine Gluconate Cloth 2 % PADS 6 each, 6 each, Topical, Daily, Loreli Slot, MD, 6 each at 10/03/22 0909   [facility], Patient & Family Education, , Does not apply, Once, Loreli Slot, MD   feeding supplement (ENSURE ENLIVE / ENSURE PLUS) liquid 237 mL, 237 mL, Oral, BID BM, Loreli Slot, MD, 237 mL at 10/04/22 1122   lactated ringers infusion, , Intravenous, Continuous, Icard, Bradley L, DO, Stopped at 10/03/22 0855   levalbuterol (XOPENEX) nebulizer solution 0.63 mg, 0.63 mg, Nebulization, Q6H PRN, Loreli Slot, MD   metoprolol tartrate (LOPRESSOR) tablet 12.5 mg, 12.5 mg, Oral, BID, 12.5 mg at 10/04/22 1122 **OR** [DISCONTINUED] metoprolol tartrate (LOPRESSOR) 25 mg/10 mL oral suspension 25 mg, 25 mg, Per Tube, BID, Loreli Slot, MD, 25 mg at 09/29/22 0950   ondansetron (ZOFRAN) injection 4 mg, 4 mg, Intravenous, Q6H PRN, Loreli Slot, MD   Oral care mouth rinse, 15 mL, Mouth Rinse, PRN, Loreli Slot, MD   piperacillin-tazobactam (ZOSYN) IVPB 3.375 g, 3.375 g, Intravenous, Q8H, Bensimhon, Bevelyn Buckles, MD, Last Rate: 12.5 mL/hr at 10/04/22 0520, 3.375 g at 10/04/22 0520   sodium chloride flush (NS) 0.9 % injection 3 mL, 3 mL, Intravenous, Q12H, Loreli Slot, MD, 3 mL at 10/02/22 0957   sodium chloride flush (NS) 0.9 % injection 3 mL, 3 mL, Intravenous, PRN, Loreli Slot, MD   tamsulosin [facility]) capsule 0.4 mg, 0.4 mg, Oral, Daily, Lightfoot,  Harrell O, MD, 0.4 mg at 10/04/22 1122   traMADol (ULTRAM) tablet 50-100 mg, 50-100 mg, Oral, Q4H PRN, Loreli Slot, MD, 50 mg at 09/26/22 1622   Patients Current Diet:  Diet Order                  DIET DYS 2 Room service appropriate? Yes with Assist; Fluid consistency: Thin  Diet effective now  Precautions / Restrictions Precautions Precautions: Fall, Other (comment), Sternal Precaution Booklet Issued: No Precaution Comments: fecal incontinence, L inattention Restrictions Weight Bearing Restrictions: Yes RUE Weight Bearing:  (sternal precautions) LUE Weight Bearing:  (sternal precautions) Other Position/Activity Restrictions: sternal precautions    Has the patient had 2 or more falls or a fall with injury in the past year? No   Prior Activity Level Community (5-7x/wk): Worked out daily, was very active.   Prior Functional Level Self Care: Did the patient need help bathing, dressing, using the toilet or eating? Independent   Indoor Mobility: Did the patient need assistance with walking from room to room (with or without device)? Independent   Stairs: Did the patient need assistance with internal or external stairs (with or without device)? Independent   Functional Cognition: Did the patient need help planning regular tasks such as shopping or remembering to take medications? Independent   Patient Information Are you of Hispanic, Latino/a,or Spanish origin?: A. No, not of Hispanic, Latino/a, or Spanish origin What is your race?: A. White Do you need or want an interpreter to communicate with a doctor or health care staff?: 0. No   Patient's Response To:  Health Literacy and Transportation Is the patient able to respond to health literacy and transportation needs?: Yes Health Literacy - How often do you need to have someone help you when you read instructions, pamphlets, or other written material from your doctor or pharmacy?: Never In the  past 12 months, has lack of transportation kept you from medical appointments or from getting medications?: No In the past 12 months, has lack of transportation kept you from meetings, work, or from getting things needed for daily living?: No   Development worker, international aid / [place] Devices/Equipment: None Home Equipment: None   Prior Device Use: Indicate devices/aids used by the patient prior to current illness, exacerbation or injury? None of the above   Current Functional Level Cognition   Arousal/Alertness: Awake/alert Overall Cognitive Status: Difficult to assess Difficult to assess due to: Impaired communication Current Attention Level: Sustained Orientation Level: Oriented to person, Oriented to situation Following Commands: Follows one step commands inconsistently, Follows one step commands with increased time Safety/Judgement: Decreased awareness of safety, Decreased awareness of deficits General Comments: pt demonstrating improving ability to engage with therapist.  After introduction, pt voicing "I'm [name]".  Pt able to voice spouse and grandson via picture on L side, but requires increased time.  He is scanning more towards L side and holding his L UE with his R, indicating improved L attention.  FOllows some 1 step commands but remains inconsistent. No carryover with sternal precautions Family reports he is brushing his teeth using R Hand and he was singing lyrics of songs upon exit. Poor motor planning still. Attention: Focused, Sustained Focused Attention: Appears intact Sustained Attention: Impaired Sustained Attention Impairment: Verbal basic, Functional basic Memory: Impaired Memory Impairment: Retrieval deficit Awareness: Impaired Awareness Impairment: Intellectual impairment    Extremity Assessment (includes Sensation/Coordination)   Upper Extremity Assessment: LUE deficits/detail, RUE deficits/detail RUE Deficits / Details: Seemingly WFL. LUE Deficits  / Details: L sensory motor deficits, moving through ~50% of range against gravity. poor prioprioception coupled with L inattention LUE Sensation: decreased proprioception LUE Coordination: decreased fine motor, decreased gross motor  Lower Extremity Assessment: Defer to PT evaluation RLE Deficits / Details: inconsistently performed knee ext AROM when cued with increased time LLE Deficits / Details: minimal AROM present in bed upon entry, did not observe any volitional  movement during session     ADLs   Overall ADL's : Needs assistance/impaired Grooming: Minimal assistance, Sitting, Wash/dry face Grooming Details (indicate cue type and reason): washing face with R UE, hand over hand to engage L UE Lower Body Dressing: Total assistance, +2 for physical assistance, +2 for safety/equipment, Sit to/from stand Lower Body Dressing Details (indicate cue type and reason): assist to don socks, +2 in standing Toilet Transfer: Maximal assistance, +2 for physical assistance, +2 for safety/equipment Toilet Transfer Details (indicate cue type and reason): simulated bed to chair, mod assist +2 to stand but requires max assist to sequence, motor plan to pivot to recliner . Toileting- Clothing Manipulation and Hygiene: Maximal assistance, +2 for safety/equipment, +2 for physical assistance Toileting - Clothing Manipulation Details (indicate cue type and reason): incontinent upon standing, max A +2 for standing balance and hygiene Functional mobility during ADLs: Moderate assistance, Maximal assistance, +2 for physical assistance, +2 for safety/equipment, Cueing for sequencing General ADL Comments: session limited by incontinent BM. R field occulsion glasses donned with some improvement noted in midline gaze     Mobility   Overal bed mobility: Needs Assistance Bed Mobility: Rolling, Sidelying to Sit, Sit to Supine Rolling: Min assist Sidelying to sit: Min assist, +2 for physical assistance, +2 for  safety/equipment Supine to sit: Max assist, +2 for physical assistance Sit to supine: Max assist, +2 for physical assistance, +2 for safety/equipment, HOB elevated General bed mobility comments: min assist for rolling and seqeucing sidelying to sitting. cueing for technique and increased time required to process.     Transfers   Overall transfer level: Needs assistance Equipment used: Rolling walker (2 wheels), 2 person hand held assist Transfers: Sit to/from Stand, Bed to chair/wheelchair/BSC Sit to Stand: Mod assist, +2 physical assistance, +2 safety/equipment Bed to/from chair/wheelchair/BSC transfer type:: Step pivot Stand pivot transfers: Max assist, +2 physical assistance, +2 safety/equipment Squat pivot transfers: Mod assist, +2 physical assistance, +2 safety/equipment Step pivot transfers: Max assist, +2 physical assistance, +2 safety/equipment General transfer comment: pt standing at EOB mulitple times with mod assist +2, utilized RW and bilateral hand held assist but difficulty sequencing/motor planning stepping with LEs, max asisst +2 to weightshift and advance LEs and maintain balance to reach chair with bil HHA. L knee instability noted, needing blocking intermittently, but much less frequent buckling and no hyperextension noted today     Ambulation / Gait / Stairs / Wheelchair Mobility   Ambulation/Gait Ambulation/Gait assistance: Max assist, +2 physical assistance, +2 safety/equipment Gait Distance (Feet): 1 Feet Assistive device: 2 person hand held assist Gait Pattern/deviations: Step-to pattern, Decreased stride length, Decreased weight shift to right, Decreased weight shift to left, Knees buckling, Trunk flexed, Leaning posteriorly General Gait Details: max asisst +2 to weightshift and advance LEs and maintain balance to reach chair with bil HHA. L knee instability noted, needing blocking intermittently, but much less frequent buckling and no hyperextension noted today Gait  velocity: reduced Gait velocity interpretation: <1.31 ft/sec, indicative of household ambulator Pre-gait activities: ACE wrap applied to L knee to prevent hyperextension, success noted. Practiced lateral weight shifting with noted L quads activation intermittently when shifting to L and VCs provided to stand up tall or straighten knees and less knee block provided. Practiced stepping anterior and posterior with L foot 1x, cuing and providing maxAx2 to weight shift to R and move L leg. Practiced stepping to R to [facility] 1-2 steps each foot, maxAx2 to weight shift, prevent posterior LOB, and manage L leg.  Posture / Balance Dynamic Sitting Balance Sitting balance - Comments: min guard to close supervision, needs cues to place hands on lap instead of pushing through bed Balance Overall balance assessment: Needs assistance Sitting-balance support: Single extremity supported, Bilateral upper extremity supported, No upper extremity supported, Feet supported Sitting balance-Leahy Scale: Fair Sitting balance - Comments: min guard to close supervision, needs cues to place hands on lap instead of pushing through bed Postural control: Right lateral lean, Posterior lean Standing balance support: Bilateral upper extremity supported, During functional activity Standing balance-Leahy Scale: Poor Standing balance comment: relies on BUE and external support     Special needs/care consideration  Fall precautions due to decreased safety awareness      Previous Home Environment  Living Arrangements: Spouse/significant other  Lives With: Spouse Available Help at Discharge: Family, Available 24 hours/day Type of Home: House Home Layout: Two level, Able to live on main level with bedroom/bathroom Home Access: Stairs to enter Entrance Stairs-Number of Steps: 3 Bathroom Shower/Tub: Optometrist: Yes Home Care Services: No Additional Comments: pt lives in  multilevel home; daughter Kathlee Nations) states pt would likely d/c to her home so he could live on one level and she can provide 24/7 assist as needed (home set-up of daughter's home; she works in Personal assistant and has flexible hours)   Discharge Living Setting Plans for Discharge Living Setting: House, Lives with (comment) (Plans to go home with daughter.) Type of Home at Discharge: House Discharge Home Layout: Two level, Able to live on main level with bedroom/bathroom Alternate Level Stairs-Number of Steps: Flight (Can stay on main level.) Discharge Home Access: Stairs to enter Entrance Stairs-Number of Steps: 3 steps front entry and 1 step at garage entry with no rails. Discharge Bathroom Shower/Tub: Tub/shower unit, Curtain Discharge Bathroom Toilet: Standard Discharge Bathroom Accessibility: No (Wife Not sure if walker can get into bathroom) Does the patient have any problems obtaining your medications?: No   Social/Family/Support Systems Patient Roles: Spouse, Parent (Has wife, daughter, son-in-law and 3 grand children) Contact Information: Daylyn Navedo - wife - [phone] Anticipated Caregiver: Wife and daughter Anticipated Caregiver's Contact Information: Herbie Drape - dtr [phone] Ability/Limitations of Caregiver: Wife and Dtr can assist.  Dtr can work from home if needed. Caregiver Availability: 24/7 Discharge Plan Discussed with Primary Caregiver: Yes Is Caregiver In Agreement with Plan?: Yes Does Caregiver/Family have Issues with Lodging/Transportation while Pt is in Rehab?: No   Goals Patient/Family Goal for Rehab: PT/OT/SLP supervision goals Expected length of stay: 12 days Pt/Family Agrees to Admission and willing to participate: Yes Program Orientation Provided & Reviewed with Pt/Caregiver Including Roles  & Responsibilities: Yes   Decrease burden of Care through IP rehab admission: N/A   Possible need for SNF placement upon discharge: Not planned   Patient  Condition: I have reviewed medical records from [facility], spoken with CM, and spouse. I discussed via phone for inpatient rehabilitation assessment.  Patient will benefit from ongoing PT, OT, and SLP, can actively participate in 3 hours of therapy a day 5 days of the week, and can make measurable gains during the admission.  Patient will also benefit from the coordinated team approach during an Inpatient Acute Rehabilitation admission.  The patient will receive intensive therapy as well as Rehabilitation physician, nursing, social worker, and care management interventions.  Due to bladder management, bowel management, safety, skin/wound care, disease management, medication administration, pain management, and patient education the patient requires 24 hour a day  rehabilitation nursing.  The patient is currently mod to max assist with mobility and basic ADLs.  Discharge setting and therapy post discharge at home with home health is anticipated.  Patient has agreed to participate in the Acute Inpatient Rehabilitation Program and will admit today.   Preadmission Screen Completed [id number], Audelia Acton, 10/04/2022 11:56 AM ______________________________________________________________________   Discussed status with Dr. Dagoberto Ligas on 10/04/22 at 1209 and received approval for admission today.   Admission Coordinator:  Cleatrice Burke, RN, time 1209 Date 10/04/22    Assessment/Plan: Diagnosis: Does the need for close, 24 hr/day Medical supervision in concert with the patient's rehab needs make it unreasonable for this patient to be served in a less intensive setting? Yes Co-Morbidities requiring supervision/potential complications: AAA dissection, post op Afib with RVR, 3 R brain strokes; sepsis with UTI- WBC down form 51 to 16k Due to bladder management, bowel management, safety, skin/wound care, disease management, medication administration, pain management, and patient education, does the patient  require 24 hr/day rehab nursing? Yes Does the patient require coordinated care of a physician, rehab nurse, PT, OT, and SLP to address physical and functional deficits in the context of the above medical diagnosis(es)? Yes Addressing deficits in the following areas: balance, endurance, locomotion, strength, transferring, bowel/bladder control, bathing, dressing, feeding, grooming, toileting, cognition, speech, language, and swallowing Can the patient actively participate in an intensive therapy program of at least 3 hrs of therapy 5 days a week? Yes The potential for patient to make measurable gains while on inpatient rehab is good Anticipated functional outcomes upon discharge from inpatient rehab: supervision PT, supervision OT, supervision SLP Estimated rehab length of stay to reach the above functional goals is: 12-14 days Anticipated discharge destination: Home 10. Overall Rehab/Functional Prognosis: good     MD Signature:           Revision History

## 2022-10-04 NOTE — Progress Notes (Signed)
Physical Therapy Treatment Patient Details Name: James Estrada MRN: [id number] DOB: 1941/04/28 Today's Date: 10/04/2022   History of Present Illness Pt is a 81 y/o M admitted to [facility] on 09/17/22 with BLE weakness, chest + back pain, admitted with CT angio shows type dissection with thrombosed false lumen. Repair of type 1 aortic dissection performed 11/4. Head CT 11/6 acute infarcts within R MCA involvement, including R cerebellum, basal ganglia, frontal lobe. ETT 11/4-11/9. PMH AAA s/p endovascular repair (2020), HTN, HLD, GERD.    PT Comments    Pt is continuing to make great progress with mobility, now demonstrating some progress with sequencing to ambulate short distances (up to ~4 ft) within the room with a RW and mod-maxAx2 today. He benefits from visual demonstration and repeated verbal and tactile cues to initiate sequencing all tasks. Pt fatigues quickly, evident by his increasing trunk and L knee flexion, instability, and SOB as time in standing progressed. Pt's family present and very helpful in engaging pt today. While his L attention has improved, he still does needs assistance to attend to his L hand to place and keep it on the RW grip. Will continue to follow acutely. Current recommendations remain appropriate.     Recommendations for follow up therapy are one component of a multi-disciplinary discharge planning process, led by the attending physician.  Recommendations may be updated based on patient status, additional functional criteria and insurance authorization.  Follow Up Recommendations  Acute inpatient rehab (3hours/day)     Assistance Recommended at Discharge Frequent or constant Supervision/Assistance  Patient can return home with the following Two people to help with walking and/or transfers;Two people to help with bathing/dressing/bathroom;Assistance with cooking/housework;Assistance with feeding;Assist for transportation;Help with stairs or ramp for entrance    Equipment Recommendations  Other (comment) (TBD)    Recommendations for Other Services       Precautions / Restrictions Precautions Precautions: Fall;Other (comment);Sternal Precaution Booklet Issued: No Precaution Comments: L inattention Restrictions Weight Bearing Restrictions: Yes Other Position/Activity Restrictions: sternal precautions     Mobility  Bed Mobility Overal bed mobility: Needs Assistance Bed Mobility: Rolling, Sidelying to Sit Rolling: Mod assist Sidelying to sit: Mod assist, HOB elevated       General bed mobility comments: Daughter demonstrating roll to pt and pt needing verbal and tactile cues to sequence how to roll as pt had difficulty problem solving without cues, modA. ModA to bring legs off EOB with cues to "kick" them off and to ascend trunk, needing assistance to release R UE from rail to reduce UE utilization.    Transfers Overall transfer level: Needs assistance Equipment used: Rolling walker (2 wheels) Transfers: Sit to/from Stand, Bed to chair/wheelchair/BSC Sit to Stand: Mod assist   Step pivot transfers: Max assist, +2 physical assistance, +2 safety/equipment, Mod assist       General transfer comment: Needs repeated reminders to keep hands on lap or not push through UEs to stand, modA to power up to stand, extend hips, and steady pt with cues for L UE placement on RW. Mod-maxAx2 to steady pt, cue for weight shifting bil, and cue/assist L leg to step anterior/posterior during swing and extend knee during stance. Assistance provided by pt's daughter on pt's R for steadying pt during transfer/gait bouts.    Ambulation/Gait Ambulation/Gait assistance: Max assist, +2 physical assistance, +2 safety/equipment, Mod assist Gait Distance (Feet): 4 Feet (x3 bouts of ~1 ft > ~2 ft > ~4 ft) Assistive device: Rolling walker (2 wheels) Gait Pattern/deviations: Step-to  pattern, Decreased stride length, Decreased weight shift to right, Decreased weight  shift to left, Trunk flexed, Leaning posteriorly, Knee flexed in stance - left, Decreased step length - right, Decreased step length - left, Decreased stance time - left Gait velocity: reduced Gait velocity interpretation: <1.31 ft/sec, indicative of household ambulator   General Gait Details: Pt needing max verbal and tactile cues to weight shift bil, advance L foot during swing, extend L knee during stance, and keep L UE on RW when ambulating. Mod-maxAx2 to ambulate with +2 assistance provided by pt's daughter on pt's R for steadying pt during transfer/gait bouts. Pt flexing trunk and L leg more as he fatigued. Needed L hand taped to RW to keep it on grip of RW throguhout   Stairs             Wheelchair Mobility    Modified Rankin (Stroke Patients Only) Modified Rankin (Stroke Patients Only) Pre-Morbid Rankin Score: No symptoms Modified Rankin: Severe disability     Balance Overall balance assessment: Needs assistance Sitting-balance support: Single extremity supported, Bilateral upper extremity supported, No upper extremity supported, Feet supported Sitting balance-Leahy Scale: Fair Sitting balance - Comments: min guard to close supervision, needs cues to place hands on lap instead of pushing through bed   Standing balance support: Bilateral upper extremity supported, During functional activity Standing balance-Leahy Scale: Poor Standing balance comment: relies on BUE and external support                            Cognition Arousal/Alertness: Awake/alert Behavior During Therapy: Flat affect Overall Cognitive Status: Difficult to assess Area of Impairment: Following commands, Safety/judgement, Awareness, Problem solving, Attention, Memory                   Current Attention Level: Sustained Memory: Decreased recall of precautions, Decreased short-term memory Following Commands: Follows one step commands inconsistently, Follows one step commands with  increased time Safety/Judgement: Decreased awareness of safety, Decreased awareness of deficits Awareness: Intellectual Problem Solving: Slow processing, Decreased initiation, Difficulty sequencing, Requires verbal cues, Requires tactile cues General Comments: Demonstrating improved sequencing with multi-modal cues today. Unable to follow more than one step command at a time though. Poor attention to L hand placement on RW. Needs increased time to process and respond to cues or to questions. Poor recall and compliance with sternal precautions, needing repeated reminders        Exercises      General Comments General comments (skin integrity, edema, etc.): VSS on RA; family present and supportive      Pertinent Vitals/Pain Pain Assessment Pain Assessment: Faces Faces Pain Scale: No hurt Pain Intervention(s): Monitored during session    Home Living                          Prior Function            PT Goals (current goals can now be found in the care plan section) Acute Rehab PT Goals Patient Stated Goal: agreeable to wanting to walk PT Goal Formulation: With patient/family Time For Goal Achievement: 10/07/22 Potential to Achieve Goals: Fair Progress towards PT goals: Progressing toward goals    Frequency    Min 4X/week      PT Plan Current plan remains appropriate    Co-evaluation              AM-PAC PT "6 Clicks" Mobility  Outcome Measure  Help needed turning from your back to your side while in a flat bed without using bedrails?: A Lot Help needed moving from lying on your back to sitting on the side of a flat bed without using bedrails?: A Lot Help needed moving to and from a bed to a chair (including a wheelchair)?: Total Help needed standing up from a chair using your arms (e.g., wheelchair or bedside chair)?: A Lot Help needed to walk in hospital room?: Total Help needed climbing 3-5 steps with a railing? : Total 6 Click Score: 9     End of Session Equipment Utilized During Treatment: Gait belt Activity Tolerance: Patient tolerated treatment well Patient left: in chair;with call bell/phone within reach;with chair alarm set;with family/visitor present Nurse Communication: Mobility status;Need for lift equipment (to NT) PT Visit Diagnosis: Other abnormalities of gait and mobility (R26.89);Muscle weakness (generalized) (M62.81);Other symptoms and signs involving the nervous system (R29.898);Unsteadiness on feet (R26.81);Difficulty in walking, not elsewhere classified (R26.2);Hemiplegia and hemiparesis Hemiplegia - Right/Left: Left Hemiplegia - dominant/non-dominant: Dominant Hemiplegia - caused by: Cerebral infarction     Time: 1250-1322 PT Time Calculation (min) (ACUTE ONLY): 32 min  Charges:  $Gait Training: 8-22 mins $Therapeutic Activity: 8-22 mins                     Moishe Spice, PT, DPT Acute Rehabilitation Services  Office: [place] 10/04/2022, 1:41 PM

## 2022-10-04 NOTE — Progress Notes (Signed)
CARDIAC REHAB PHASE I     Pt resting in bed with family at bedside. Post [facility] education including site care, restrictions, IS use, sternal precautions, risk factors, heart healthy diet and CRP2 reviewed. All question and concerns addressed. Pt and family interested in CRP2 after inpatient rehab completed.  Will send referral to [facility]. Plan for CIR today.    [id number]  Woodroe Chen, RN BSN 10/04/2022 10:17 AM

## 2022-10-04 NOTE — Progress Notes (Signed)
Inpatient Rehabilitation Admissions Coordinator   I have insurance approval to admit to Cir today. I have alerted acute team and TOC to make the arrangements to admit today.  Ottie Glazier, RN, MSN Rehab Admissions Coordinator [phone] 10/04/2022 10:28 AM

## 2022-10-04 NOTE — H&P (Signed)
Physical Medicine and Rehabilitation Admission H&P    CC: Functional deficits secondary to right embolic shower  HPI: James Estrada si a 81 year old L handed male who presented from home to the emergency department via EMS on 09/17/2022 with acute onset of bilateral leg weakness and left upper back pain.  The patient's medical history is significant for multiple joint replacements and abdominal aortic aneurysm status post stent graft placement July, 2021.  Neurology was consulted.  Stat CT examination of the head revealed no acute abnormality.  CT dissection protocol revealed acute aortic dissection with retroperitoneal blood adjacent to the left psoas muscle.  Emergent aortic dissection repair with effort to spare the artery supplying the spinal cord emergently necessary and the patient was admitted by Dr. Dorris Fetch.  The patient was counseled regarding emergent need of repair of type I dissection.  He underwent placement of 30 mm Hemashield graft by Dr. Dorris Fetch on 11/5 and was transferred to the ICU.  Rapid wean protocol initiated.  Hypertension hypokalemia addressed.  Elevated serum creatinine consistent with acute kidney injury.  No Lovenox given secondary to thrombocytopenia.  SCDs for DVT prophylaxis.  Advanced heart failure team consulted 11/6.  Remained on Cardene infusion for blood pressure control.  Aspirin and beta-blocker ordered.  Lasix infusion for diuresis.  Postoperative paroxysmal atrial fibrillation noted and placed on amiodarone infusion.  Neurology continued to follow the patient due to extremity weakness.  CT head performed 11/6 revealed 3 small right-sided strokes 1 in the cerebellum and 1 in the right basal ganglia as well as 1 in the frontal lobe with a 4 mm area of bleeding.  CTA without large vessel occlusion.  He exhibited left-sided weakness.  EEG performed.  Hyperglycemia treated and resolved.  Tube feedings initiated 11/7. Exhibited some agitation and was left  intubated.  PCCM consulted on 11/8 for assistance with ventilator weaning.  He was extubated on 11/9.  Lopressor started for blood pressure control.  MRI brain performed 11/11 which showed numerous bilateral foci of acute ischemia right greater than left and MCA and PCA territories as well as the cerebellum.  Neurology continued to follow the patient and recommends aspirin 81 mg daily and if atrial fibrillation recurs to consider anticoagulation.  No statin needed given that LDL is at goal.  Diuretics held 11/12.  He remained in normal sinus rhythm on amiodarone.  AKI likely secondary to ATN from hemorrhagic shock/hypotension continued to improve.  Blood pressure slowly climbed and permissive hypertension allowed until 11/14.  Losartan 25 mg daily then added.  Later that day, he went into atrial fibrillation with rapid ventricular response and oral amiodarone was discontinued and bolused and started on amiodarone infusion.  He converted back into sinus rhythm.  Core track tube placed.  Hemoglobin remained stable.  White blood cell count trended upward and urinalysis was negative.  Chest x-ray with bibasilar atelectasis versus infiltrate.  Treated with vancomycin and Zosyn.  Was found to have urinary retention and a Foley catheter was placed.  He experienced diarrhea and C. difficile toxin was negative.  Metoprolol continued 12.5 mg BID. Losartan and spironolactone started and discontinued. Modified barium swallow completed and p.o. intake initiated.  He is currently tolerating dysphagia 2 diet with thin liquids.  Surgical incisions are healing without signs of infection.  He is now on amiodarone 200 mg orally twice daily.  He will transition to Augmentin for 10 days.The patient requires inpatient physical medicine and rehabilitation evaluations and treatment secondary to dysfunction  due to embolic stroke.  Per wife and daughter, pt just finished therapy and is exhausted- doesn't know when had LBM, but denies  constipation. Has foley-  Denies pain, but then kept perseverating on saying "4/5/6"- so wasn't clear if had pain. Per family, has also been distressed/agitated, trying to keep getting out of bed- slightly better today, but was bad yesterday.    Review of Systems  Unable to perform ROS: Language (limited due to aphasia)   Past Medical History:  Diagnosis Date   AAA (abdominal aortic aneurysm) ([place])    last u/s done 07/18/17    Arthritis    Bradycardia    Dysrhythmia    frequent PAC, for 20 years   GERD (gastroesophageal reflux disease)    occ, OTC   Hemorrhoids    History of hiatal hernia    Hyperlipidemia    Hypertension    Seasonal allergies    Past Surgical History:  Procedure Laterality Date   ABDOMINAL AORTIC ENDOVASCULAR STENT GRAFT N/A 05/04/2020   Procedure: ABDOMINAL AORTIC ENDOVASCULAR STENT GRAFT;  Surgeon: [name], MD;  Location: [facility];  Service: Vascular;  Laterality: N/A;   CATARACT EXTRACTION Bilateral 2009   DIAGNOSTIC LAPAROSCOPY     laparoscopic hernia repair   EYE SURGERY Bilateral    cataract removal   HERNIA REPAIR Bilateral 1999, 2006   JOINT REPLACEMENT Right ~2018   hip replacement   pheochromocytoma  1993   PROSTATECTOMY N/A 05/15/2013   Procedure: PROSTATECTOMY RETROPUBIC; SIMPLE OPEN PROSTATECTOMY;  Surgeon: [name], MD;  Location: [facility];  Service: Urology;  Laterality: N/A;   REPAIR OF ACUTE ASCENDING THORACIC AORTIC DISSECTION N/A 09/17/2022   Procedure: REPAIR OF ACUTE ASCENDING THORACIC AORTIC DISSECTION USING 28 MM HEMASHIELD PLATINUM VASCULAR GRAFT;  Surgeon: [name], MD;  Location: [facility];  Service: Vascular;  Laterality: N/A;  Median sternotomy   TOTAL ELBOW REPLACEMENT Left    > 30 years ago   TOTAL HIP ARTHROPLASTY Right 09/08/2017   Procedure: RIGHT TOTAL HIP ARTHROPLASTY ANTERIOR APPROACH;  Surgeon: [name], MD;  Location: [facility];  Service: Orthopedics;  Laterality: Right;   ULTRASOUND GUIDANCE  FOR VASCULAR ACCESS Bilateral 05/04/2020   Procedure: ULTRASOUND GUIDANCE FOR VASCULAR ACCESS;  Surgeon: [name], MD;  Location: [facility];  Service: Vascular;  Laterality: Bilateral;   Family History  Problem Relation Age of Onset   Heart disease Mother    Coronary artery disease Mother    Aneurysm Father    Diabetes Sister    Social History:  reports that he has never smoked. He has never used smokeless tobacco. He reports current alcohol use. He reports that he does not use drugs. Allergies: No Known Allergies No medications prior to admission.      Home: Home Living Family/patient expects to be discharged to:: Private residence Living Arrangements: Spouse/significant other Available Help at Discharge: Family, Available 24 hours/day Type of Home: House Home Access: Stairs to enter Technical brewer of Steps: 3 Home Layout: Two level, Able to live on main level with bedroom/bathroom Bathroom Shower/Tub: Chiropodist: Standard Bathroom Accessibility: Yes Home Equipment: None Additional Comments: pt lives in multilevel home; daughter Kathlee Nations) states pt would likely d/c to her home so he could live on one level and she can provide 24/7 assist as needed (home set-up of daughter's home; she works in Personal assistant and has flexible hours)  Lives With: Spouse   Functional History: Prior Function Prior Level of  Function : Independent/Modified Independent, Working/employed, Driving Mobility Comments: 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 retired English as a second language teacher, served in [place] ADLs Comments: Independent without DME. Wife does majority of household tasks; has housekeeper  Functional Status:  Mobility: Bed Mobility Overal bed mobility: Needs Assistance Bed Mobility: Rolling, Sidelying to Sit, Sit to Supine Rolling: Min assist Sidelying to  sit: Min assist, +2 for physical assistance, +2 for safety/equipment Supine to sit: Max assist, +2 for physical assistance Sit to supine: Max assist, +2 for physical assistance, +2 for safety/equipment, HOB elevated General bed mobility comments: min assist for rolling and seqeucing sidelying to sitting. cueing for technique and increased time required to process. Transfers Overall transfer level: Needs assistance Equipment used: Rolling walker (2 wheels), 2 person hand held assist Transfers: Sit to/from Stand, Bed to chair/wheelchair/BSC Sit to Stand: Mod assist, +2 physical assistance, +2 safety/equipment Bed to/from chair/wheelchair/BSC transfer type:: Step pivot Stand pivot transfers: Max assist, +2 physical assistance, +2 safety/equipment Squat pivot transfers: Mod assist, +2 physical assistance, +2 safety/equipment Step pivot transfers: Max assist, +2 physical assistance, +2 safety/equipment General transfer comment: pt standing at EOB mulitple times with mod assist +2, utilized RW and bilateral hand held assist but difficulty sequencing/motor planning stepping with LEs, max asisst +2 to weightshift and advance LEs and maintain balance to reach chair with bil HHA. L knee instability noted, needing blocking intermittently, but much less frequent buckling and no hyperextension noted today Ambulation/Gait Ambulation/Gait assistance: Max assist, +2 physical assistance, +2 safety/equipment Gait Distance (Feet): 1 Feet Assistive device: 2 person hand held assist Gait Pattern/deviations: Step-to pattern, Decreased stride length, Decreased weight shift to right, Decreased weight shift to left, Knees buckling, Trunk flexed, Leaning posteriorly General Gait Details: max asisst +2 to weightshift and advance LEs and maintain balance to reach chair with bil HHA. L knee instability noted, needing blocking intermittently, but much less frequent buckling and no hyperextension noted today Gait velocity:  reduced Gait velocity interpretation: <1.31 ft/sec, indicative of household ambulator Pre-gait activities: ACE wrap applied to L knee to prevent hyperextension, success noted. Practiced lateral weight shifting with noted L quads activation intermittently when shifting to L and VCs provided to stand up tall or straighten knees and less knee block provided. Practiced stepping anterior and posterior with L foot 1x, cuing and providing maxAx2 to weight shift to R and move L leg. Practiced stepping to R to [facility] 1-2 steps each foot, maxAx2 to weight shift, prevent posterior LOB, and manage L leg.    ADL: ADL Overall ADL's : Needs assistance/impaired Grooming: Minimal assistance, Sitting, Wash/dry face Grooming Details (indicate cue type and reason): washing face with R UE, hand over hand to engage L UE Lower Body Dressing: Total assistance, +2 for physical assistance, +2 for safety/equipment, Sit to/from stand Lower Body Dressing Details (indicate cue type and reason): assist to don socks, +2 in standing Toilet Transfer: Maximal assistance, +2 for physical assistance, +2 for safety/equipment Toilet Transfer Details (indicate cue type and reason): simulated bed to chair, mod assist +2 to stand but requires max assist to sequence, motor plan to pivot to recliner . Toileting- Clothing Manipulation and Hygiene: Maximal assistance, +2 for safety/equipment, +2 for physical assistance Toileting - Clothing Manipulation Details (indicate cue type and reason): incontinent upon standing, max A +2 for standing balance and hygiene Functional mobility during ADLs: Moderate assistance, Maximal assistance, +2 for physical assistance, +2  for safety/equipment, Cueing for sequencing General ADL Comments: session limited by incontinent BM. R field occulsion glasses donned with some improvement noted in midline gaze  Cognition: Cognition Overall Cognitive Status: Difficult to assess Arousal/Alertness:  Awake/alert Orientation Level: Oriented X4 Attention: Focused, Sustained Focused Attention: Appears intact Sustained Attention: Impaired Sustained Attention Impairment: Verbal basic, Functional basic Memory: Impaired Memory Impairment: Retrieval deficit Awareness: Impaired Awareness Impairment: Intellectual impairment Cognition Arousal/Alertness: Awake/alert Behavior During Therapy: Flat affect Overall Cognitive Status: Difficult to assess Area of Impairment: Following commands, Safety/judgement, Awareness, Problem solving, Attention, Memory Current Attention Level: Sustained Memory: Decreased recall of precautions, Decreased short-term memory Following Commands: Follows one step commands inconsistently, Follows one step commands with increased time Safety/Judgement: Decreased awareness of safety, Decreased awareness of deficits Awareness: Intellectual Problem Solving: Slow processing, Decreased initiation, Difficulty sequencing, Requires verbal cues, Requires tactile cues General Comments: pt demonstrating improving ability to engage with therapist.  After introduction, pt voicing "I'm [name]".  Pt able to voice spouse and grandson via picture on L side, but requires increased time.  He is scanning more towards L side and holding his L UE with his R, indicating improved L attention.  FOllows some 1 step commands but remains inconsistent. No carryover with sternal precautions Family reports he is brushing his teeth using R Hand and he was singing lyrics of songs upon exit. Poor motor planning still. Difficult to assess due to: Impaired communication  Physical Exam: Blood pressure 122/62, pulse 66, temperature 98.1 F (36.7 C), temperature source Oral, resp. rate 17, height 6' (1.829 m), weight 87.4 kg, SpO2 98 %. Physical Exam Vitals and nursing note reviewed. Exam conducted with a chaperone present.  Constitutional:      General: He is not in acute distress.    Comments: Pt sitting up  in bedside chair- kept closing eyes; difficult to participate in exam, daughter and wife at bedside, NAD  HENT:     Head: Normocephalic and atraumatic.     Comments: L facial droop- mild Tongue midline- but furrowed Sensation decrease don L side of face    Right Ear: External ear normal.     Left Ear: External ear normal.     Nose: Nose normal. No congestion.     Mouth/Throat:     Mouth: Mucous membranes are dry.     Pharynx: Oropharynx is clear. No oropharyngeal exudate.  Eyes:     General:        Right eye: No discharge.        Left eye: No discharge.     Pupils: Pupils are equal, round, and reactive to light.     Comments: R gaze preference; difficulty looking left  Cardiovascular:     Rate and Rhythm: Normal rate and regular rhythm.     Heart sounds: Normal heart sounds. No murmur heard.    No gallop.  Pulmonary:     Effort: Pulmonary effort is normal. No respiratory distress.     Breath sounds: Normal breath sounds. No stridor. No wheezing or rales.  Abdominal:     General: Bowel sounds are normal. There is no distension.     Palpations: Abdomen is soft.     Tenderness: There is no abdominal tenderness.  Genitourinary:    Comments: Has condom cath in place- medium amber urine Musculoskeletal:     Cervical back: Neck supple. No tenderness.     Right lower leg: No edema.     Left lower leg: No edema.     Comments:  Difficulty following commands LUE biceps 4/5; grip 3+/5-  RUE 5-/5 in same muscles LLE- DF 4/5 and PF 4/5 RLE 5/5 in DF and PF  Skin:    General: Skin is warm and dry.     Comments: IV R forearm= wrapped in kerlex - looks OK  Neurological:     Mental Status: He is alert.     Comments: Pt could only repeat what I said, except perseverating on "4/5/6" when asked where he was.  Very perseverative during motor exam- couldn't move onto next task even 1-2 minutes later Decreased to L touch on L side Can repeat what is asked L inattention/mild-moderate neglect   Psychiatric:     Comments: Flat, sleepy     Results for orders placed or performed during the hospital encounter of 09/17/22 (from the past 48 hour(s))  Glucose, capillary     Status: Abnormal   Collection Time: 10/02/22 11:33 AM  Result Value Ref Range   Glucose-Capillary 119 (H) 70 - 99 mg/dL    Comment: Glucose reference range applies only to samples taken after fasting for at least 8 hours.  Glucose, capillary     Status: None   Collection Time: 10/02/22  5:10 PM  Result Value Ref Range   Glucose-Capillary 97 70 - 99 mg/dL    Comment: Glucose reference range applies only to samples taken after fasting for at least 8 hours.  Basic metabolic panel     Status: Abnormal   Collection Time: 10/02/22  5:39 PM  Result Value Ref Range   Sodium 136 135 - 145 mmol/L   Potassium 4.1 3.5 - 5.1 mmol/L   Chloride 101 98 - 111 mmol/L   CO2 26 22 - 32 mmol/L   Glucose, Bld 135 (H) 70 - 99 mg/dL    Comment: Glucose reference range applies only to samples taken after fasting for at least 8 hours.   BUN 38 (H) 8 - 23 mg/dL   Creatinine, Ser 1.81 (H) 0.61 - 1.24 mg/dL   Calcium 8.1 (L) 8.9 - 10.3 mg/dL   GFR, Estimated 38 (L) >60 mL/min    Comment: (NOTE) Calculated using the CKD-EPI Creatinine Equation (2021)    Anion gap 9 5 - 15    Comment: Performed at [facility] [address], [place], [place] [address]  CBC     Status: Abnormal   Collection Time: 10/02/22  5:39 PM  Result Value Ref Range   WBC 20.5 (H) 4.0 - 10.5 K/uL   RBC 3.11 (L) 4.22 - 5.81 MIL/uL   Hemoglobin 8.8 (L) 13.0 - 17.0 g/dL   HCT 28.2 (L) 39.0 - 52.0 %   MCV 90.7 80.0 - 100.0 fL   MCH 28.3 26.0 - 34.0 pg   MCHC 31.2 30.0 - 36.0 g/dL   RDW 15.1 11.5 - 15.5 %   Platelets 314 150 - 400 K/uL   nRBC 0.0 0.0 - 0.2 %    Comment: Performed at [facility], [place] [address], [place], [place] [address]  Glucose, capillary     Status: Abnormal   Collection Time: 10/02/22  9:16 PM  Result Value Ref Range    Glucose-Capillary 141 (H) 70 - 99 mg/dL    Comment: Glucose reference range applies only to samples taken after fasting for at least 8 hours.  Glucose, capillary     Status: Abnormal   Collection Time: 10/03/22  6:42 AM  Result Value Ref Range   Glucose-Capillary 126 (H)  70 - 99 mg/dL    Comment: Glucose reference range applies only to samples taken after fasting for at least 8 hours.  Basic metabolic panel     Status: Abnormal   Collection Time: 10/04/22 12:52 AM  Result Value Ref Range   Sodium 138 135 - 145 mmol/L   Potassium 4.0 3.5 - 5.1 mmol/L   Chloride 107 98 - 111 mmol/L   CO2 22 22 - 32 mmol/L   Glucose, Bld 121 (H) 70 - 99 mg/dL    Comment: Glucose reference range applies only to samples taken after fasting for at least 8 hours.   BUN 28 (H) 8 - 23 mg/dL   Creatinine, Ser 1.37 (H) 0.61 - 1.24 mg/dL   Calcium 8.2 (L) 8.9 - 10.3 mg/dL   GFR, Estimated 53 (L) >60 mL/min    Comment: (NOTE) Calculated using the CKD-EPI Creatinine Equation (2021)    Anion gap 9 5 - 15    Comment: Performed at [facility] [address] [place], [place] [address]  CBC     Status: Abnormal   Collection Time: 10/04/22 12:52 AM  Result Value Ref Range   WBC 16.4 (H) 4.0 - 10.5 K/uL   RBC 2.90 (L) 4.22 - 5.81 MIL/uL   Hemoglobin 8.2 (L) 13.0 - 17.0 g/dL   HCT 26.0 (L) 39.0 - 52.0 %   MCV 89.7 80.0 - 100.0 fL   MCH 28.3 26.0 - 34.0 pg   MCHC 31.5 30.0 - 36.0 g/dL   RDW 14.7 11.5 - 15.5 %   Platelets 312 150 - 400 K/uL   nRBC 0.0 0.0 - 0.2 %    Comment: Performed at [facility], [place] [address], [place], [place] [address]   No results found.    Blood pressure 122/62, pulse 66, temperature 98.1 F (36.7 C), temperature source Oral, resp. rate 17, height 6' (1.829 m), weight 87.4 kg, SpO2 98 %.  Medical Problem List and Plan: 1. Functional deficits secondary to R brain strokes x3- frontal, BG and cerebellum with L hemiparesis and aphasia s/p Aortic dissection  -patient may   shower  -ELOS/Goals: 12-14 days supervision- needs PT, OT and SLP- might have language center on R side 2.  Antithrombotics: -DVT/anticoagulation: If atrial fibrillation recurs, may need to consider AC  -antiplatelet therapy: aspirin 81 mg daily  SCDs and TEDs; consider starting Lovenox 3. Pain Management: Tylenol as needed 4. Mood/Behavior/Sleep: LCSW to evaluate and provide emotional support  -antipsychotic agents: n/a 5. Neuropsych/cognition: This patient is not capable of making decisions on his own behalf. 6. Skin/Wound Care: Routine skin care checks  7. Fluids/Electrolytes/Nutrition: Strict Is and Os and follow-up chemistries  -daily weight  -continue dysphagia 2 diet/thins; SLP eval  -continue feeding supplement 8: Heart failure with pEF (see meds below)  -monitor Mg and K  -daily weight (weight range 65kg)  -follow-up with Dr. Haroldine Laws 9: Leukocytosis/leukemoid reaction: Zosyn to finish 11/21 -starting Augmentin BID for 10 days  -follow-up CBC 10: Post-op atrial fibrillation:  -continue amiodarone 200 mg BID for 7 days then 200 mg daily  -if recurs, consider AC 11: Left renal artery stenosis: follow-up as outpatient 12: Acute blood loss anemia: follow-up CBC 13: Hyperlipidemia: continue Lipitor 40 mg daily 14: GERD/HH: no meds (Pepcid discontinued) 15: Hypertension: montior TID and prn  -continue Lopressor 12.5 mg BID  -restart losartan 50 mg daily, Spironolactone 12.5 mg as BP allows 16: Acute urinary retention:  -has foley cath  -continue  Flomax 0.4 mg daily 17: Acute kidney injury: serum creatinine improving; follow-up BMP 18. Agitation/confusion/Negative mood- per family- didn't see myself- might need Seroquel vs Depakote to help with calming pt down  I have personally performed a face to face diagnostic evaluation of this patient and formulated the key components of the plan.  Additionally, I have personally reviewed laboratory data, imaging studies, as well as  relevant notes and concur with the physician assistant's documentation above.   The patient's status has not changed from the original H&P.  Any changes in documentation from the acute care chart have been noted above.     Barbie Banner, PA-C 10/04/2022

## 2022-10-04 NOTE — Progress Notes (Addendum)
      [address]       [place] [address]             [phone]      17 Days Post-Op Procedure(s) (LRB): REPAIR OF ACUTE ASCENDING THORACIC AORTIC DISSECTION USING 28 MM HEMASHIELD PLATINUM VASCULAR GRAFT (N/A)  Subjective:  Patient without complaints.    Objective: Vital signs in last 24 hours: Temp:  [97 F (36.1 C)-98.8 F (37.1 C)] 98 F (36.7 C) (11/21 0718) Pulse Rate:  [60-94] 68 (11/21 0718) Cardiac Rhythm: Normal sinus rhythm (11/20 1900) Resp:  [15-21] 17 (11/21 0718) BP: (115-132)/(58-69) 115/61 (11/21 0718) SpO2:  [95 %-100 %] 98 % (11/21 0718) Weight:  [81.3 kg-87.4 kg] 87.4 kg (11/21 0421)  Intake/Output from previous day: 11/20 0701 - 11/21 0700 In: 1054 [P.O.:720; I.V.:263.1; IV Piggyback:70.9] Out: 5850 [Urine:5850]  General appearance: alert, cooperative, and no distress Heart: regular rate and rhythm Lungs: clear to auscultation bilaterally Abdomen: soft, non-tender; bowel sounds normal; no masses,  no organomegaly Extremities: extremities normal, atraumatic, no cyanosis or edema Wound: clean and dry Neuro:improving, continue Left extremities weakness >R  Lab Results: Recent Labs    10/02/22 1739 10/04/22 0052  WBC 20.5* 16.4*  HGB 8.8* 8.2*  HCT 28.2* 26.0*  PLT 314 312   BMET:  Recent Labs    10/02/22 1739 10/04/22 0052  NA 136 138  K 4.1 4.0  CL 101 107  CO2 26 22  GLUCOSE 135* 121*  BUN 38* 28*  CREATININE 1.81* 1.37*  CALCIUM 8.1* 8.2*    PT/INR: No results for input(s): "LABPROT", "INR" in the last 72 hours. ABG    Component Value Date/Time   PHART 7.479 (H) 09/20/2022 0830   HCO3 25.2 09/20/2022 0830   TCO2 26 09/20/2022 0830   ACIDBASEDEF 1.0 09/18/2022 1629   O2SAT 94 09/20/2022 0830   CBG (last 3)  Recent Labs    10/02/22 1710 10/02/22 2116 10/03/22 0642  GLUCAP 97 141* 126*    Assessment/Plan: S/P Procedure(s) (LRB): REPAIR OF ACUTE ASCENDING THORACIC AORTIC DISSECTION USING 28 MM  HEMASHIELD PLATINUM VASCULAR GRAFT (N/A)  CV- maintaining NSR on Lopressor, Amiodarone reduced to 200 mg BID yesterday Pulm- off oxygen, no acute issues Renal- AKI, improving down to 1.30, on flomax, foley remains in place.. will discuss management with Dr. Dorris Fetch Dysphagia- continue SLP, currently on dysphagia 2 diet Deconditioning- working with PT/OT, SLP Dispo- patient doing well, continues to improve, I think he is ready for d/c to CIR, will confirm with Dr. Dorris Fetch and discuss plan for foley   LOS: 17 days    Lowella Dandy, PA-C 10/04/2022 Patient seen and examined, agree with assessment and plan as outlined above  Viviann Spare C. Dorris Fetch, MD [facility] [phone]

## 2022-10-05 DIAGNOSIS — I63521 Cerebral infarction due to unspecified occlusion or stenosis of right anterior cerebral artery: Secondary | ICD-10-CM | POA: Diagnosis not present

## 2022-10-05 LAB — CBC WITH DIFFERENTIAL/PLATELET
Abs Immature Granulocytes: 0.71 10*3/uL — ABNORMAL HIGH (ref 0.00–0.07)
Basophils Absolute: 0.1 10*3/uL (ref 0.0–0.1)
Basophils Relative: 1 %
Eosinophils Absolute: 0.2 10*3/uL (ref 0.0–0.5)
Eosinophils Relative: 1 %
HCT: 25.9 % — ABNORMAL LOW (ref 39.0–52.0)
Hemoglobin: 8.3 g/dL — ABNORMAL LOW (ref 13.0–17.0)
Immature Granulocytes: 5 %
Lymphocytes Relative: 12 %
Lymphs Abs: 1.6 10*3/uL (ref 0.7–4.0)
MCH: 28.3 pg (ref 26.0–34.0)
MCHC: 32 g/dL (ref 30.0–36.0)
MCV: 88.4 fL (ref 80.0–100.0)
Monocytes Absolute: 1.6 10*3/uL — ABNORMAL HIGH (ref 0.1–1.0)
Monocytes Relative: 12 %
Neutro Abs: 9.7 10*3/uL — ABNORMAL HIGH (ref 1.7–7.7)
Neutrophils Relative %: 69 %
Platelets: 310 10*3/uL (ref 150–400)
RBC: 2.93 MIL/uL — ABNORMAL LOW (ref 4.22–5.81)
RDW: 14.7 % (ref 11.5–15.5)
WBC: 13.9 10*3/uL — ABNORMAL HIGH (ref 4.0–10.5)
nRBC: 0 % (ref 0.0–0.2)

## 2022-10-05 LAB — CULTURE, BLOOD (ROUTINE X 2)
Culture: NO GROWTH
Culture: NO GROWTH
Special Requests: ADEQUATE

## 2022-10-05 LAB — COMPREHENSIVE METABOLIC PANEL
ALT: 22 U/L (ref 0–44)
AST: 21 U/L (ref 15–41)
Albumin: 2.2 g/dL — ABNORMAL LOW (ref 3.5–5.0)
Alkaline Phosphatase: 53 U/L (ref 38–126)
Anion gap: 7 (ref 5–15)
BUN: 18 mg/dL (ref 8–23)
CO2: 23 mmol/L (ref 22–32)
Calcium: 8.1 mg/dL — ABNORMAL LOW (ref 8.9–10.3)
Chloride: 107 mmol/L (ref 98–111)
Creatinine, Ser: 1.08 mg/dL (ref 0.61–1.24)
GFR, Estimated: >60 mL/min (ref >60)
Glucose, Bld: 120 mg/dL — ABNORMAL HIGH (ref 70–99)
Potassium: 3.9 mmol/L (ref 3.5–5.1)
Sodium: 137 mmol/L (ref 135–145)
Total Bilirubin: 0.8 mg/dL (ref 0.3–1.2)
Total Protein: 5.2 g/dL — ABNORMAL LOW (ref 6.5–8.1)

## 2022-10-05 LAB — MAGNESIUM: Magnesium: 2 mg/dL (ref 1.7–2.4)

## 2022-10-05 MED ORDER — LIDOCAINE HCL URETHRAL/MUCOSAL 2 % EX GEL
CUTANEOUS | Status: DC | PRN
  Administered 2022-10-05: 6 via TOPICAL
  Administered 2022-10-06: 1 via TOPICAL
  Administered 2022-10-06 – 2022-10-07 (×4): 6 via TOPICAL
  Filled 2022-10-05 (×7): qty 6

## 2022-10-05 NOTE — Evaluation (Signed)
Physical Therapy Assessment and Plan  Patient Details  Name: James Estrada MRN: [id number] Date of Birth: January 14, 1941  PT Diagnosis: Difficulty walking, Dizziness and giddiness, Hemiparesis non-dominant, Impaired cognition, Impaired sensation, and Muscle weakness Rehab Potential: Good ELOS: 14-18 days   Today's Date: 10/05/2022 PT Individual Time: 1302-1408 PT Individual Time Calculation (min): 66 min    Hospital Problem: Principal Problem:   CVA (cerebral vascular accident) [facility])   Past Medical History:  Past Medical History:  Diagnosis Date   AAA (abdominal aortic aneurysm) ([place])    last u/s done 07/18/17    Arthritis    Bradycardia    Dysrhythmia    frequent PAC, for 20 years   GERD (gastroesophageal reflux disease)    occ, OTC   Hemorrhoids    History of hiatal hernia    Hyperlipidemia    Hypertension    Seasonal allergies    Past Surgical History:  Past Surgical History:  Procedure Laterality Date   ABDOMINAL AORTIC ENDOVASCULAR STENT GRAFT N/A 05/04/2020   Procedure: ABDOMINAL AORTIC ENDOVASCULAR STENT GRAFT;  Surgeon: [name], MD;  Location: [place];  Service: Vascular;  Laterality: N/A;   CATARACT EXTRACTION Bilateral 2009   DIAGNOSTIC LAPAROSCOPY     laparoscopic hernia repair   EYE SURGERY Bilateral    cataract removal   HERNIA REPAIR Bilateral 1999, 2006   JOINT REPLACEMENT Right ~2018   hip replacement   pheochromocytoma  1993   PROSTATECTOMY N/A 05/15/2013   Procedure: PROSTATECTOMY RETROPUBIC; SIMPLE OPEN PROSTATECTOMY;  Surgeon: [name], MD;  Location: [facility];  Service: Urology;  Laterality: N/A;   REPAIR OF ACUTE ASCENDING THORACIC AORTIC DISSECTION N/A 09/17/2022   Procedure: REPAIR OF ACUTE ASCENDING THORACIC AORTIC DISSECTION USING 28 MM HEMASHIELD PLATINUM VASCULAR GRAFT;  Surgeon: [name], MD;  Location: [place];  Service: Vascular;  Laterality: N/A;  Median sternotomy   TOTAL ELBOW REPLACEMENT Left    > 30 years ago    TOTAL HIP ARTHROPLASTY Right 09/08/2017   Procedure: RIGHT TOTAL HIP ARTHROPLASTY ANTERIOR APPROACH;  Surgeon: [name], MD;  Location: [facility];  Service: Orthopedics;  Laterality: Right;   ULTRASOUND GUIDANCE FOR VASCULAR ACCESS Bilateral 05/04/2020   Procedure: ULTRASOUND GUIDANCE FOR VASCULAR ACCESS;  Surgeon: [name], MD;  Location: [facility] OR;  Service: Vascular;  Laterality: Bilateral;    Assessment & Plan Clinical Impression: Patient is a 81 y.o. L handed male who presented from home to the emergency department via EMS on 09/17/2022 with acute onset of bilateral leg weakness and left upper back pain.  The patient's medical history is significant for multiple joint replacements and abdominal aortic aneurysm status post stent graft placement July, 2021.  Neurology was consulted.  Stat CT examination of the head revealed no acute abnormality.  CT dissection protocol revealed acute aortic dissection with retroperitoneal blood adjacent to the left psoas muscle.  Emergent aortic dissection repair with effort to spare the artery supplying the spinal cord emergently necessary and the patient was admitted by Dr. Roxan Hockey.  The patient was counseled regarding emergent need of repair of type I dissection.  He underwent placement of 30 mm Hemashield graft by Dr. Roxan Hockey on 11/5 and was transferred to the ICU.  Rapid wean protocol initiated.  Hypertension hypokalemia addressed.  Elevated serum creatinine consistent with acute kidney injury.  No Lovenox given secondary to thrombocytopenia.  SCDs for DVT prophylaxis.  Advanced heart failure team consulted 11/6.  Remained on Cardene infusion for blood  pressure control.  Aspirin and beta-blocker ordered.  Lasix infusion for diuresis.  Postoperative paroxysmal atrial fibrillation noted and placed on amiodarone infusion.  Neurology continued to follow the patient due to extremity weakness.  CT head performed 11/6 revealed 3 small right-sided strokes 1  in the cerebellum and 1 in the right basal ganglia as well as 1 in the frontal lobe with a 4 mm area of bleeding.  CTA without large vessel occlusion.  He exhibited left-sided weakness.  EEG performed.  Hyperglycemia treated and resolved.  Tube feedings initiated 11/7. Exhibited some agitation and was left intubated.  PCCM consulted on 11/8 for assistance with ventilator weaning.  He was extubated on 11/9.  Lopressor started for blood pressure control.  MRI brain performed 11/11 which showed numerous bilateral foci of acute ischemia right greater than left and MCA and PCA territories as well as the cerebellum.  Neurology continued to follow the patient and recommends aspirin 81 mg daily and if atrial fibrillation recurs to consider anticoagulation.  No statin needed given that LDL is at goal.  Diuretics held 11/12.  He remained in normal sinus rhythm on amiodarone.  AKI likely secondary to ATN from hemorrhagic shock/hypotension continued to improve.  Blood pressure slowly climbed and permissive hypertension allowed until 11/14.  Losartan 25 mg daily then added.  Later that day, he went into atrial fibrillation with rapid ventricular response and oral amiodarone was discontinued and bolused and started on amiodarone infusion.  He converted back into sinus rhythm.  Core track tube placed.  Hemoglobin remained stable.  White blood cell count trended upward and urinalysis was negative.  Chest x-ray with bibasilar atelectasis versus infiltrate.  Treated with vancomycin and Zosyn.  Was found to have urinary retention and a Foley catheter was placed.  He experienced diarrhea and C. difficile toxin was negative.  Metoprolol continued 12.5 mg BID. Losartan and spironolactone started and discontinued. Modified barium swallow completed and p.o. intake initiated.  He is currently tolerating dysphagia 2 diet with thin liquids.  Surgical incisions are healing without signs of infection.  He is now on amiodarone 200 mg orally  twice daily.  He will transition to Augmentin for 10 days.The patient requires inpatient physical medicine and rehabilitation evaluations and treatment secondary to dysfunction due to embolic stroke.  Patient transferred to CIR on 10/04/2022 .   Patient currently requires max assist with mobility secondary to muscle weakness, decreased cardiorespiratoy endurance, impaired timing and sequencing, unbalanced muscle activation, and decreased coordination, decreased attention to left and decreased motor planning, decreased initiation, decreased attention, decreased awareness, decreased problem solving, decreased safety awareness, decreased memory, and delayed processing, and decreased standing balance, decreased postural control, decreased balance strategies, and difficulty maintaining precautions.  Prior to hospitalization, patient was independent  with mobility and lived with Spouse in a House home.  Home access is 3Stairs to enter.  Patient will benefit from skilled PT intervention to maximize safe functional mobility, minimize fall risk, and decrease caregiver burden for planned discharge home with 24 hour assist.  Anticipate patient will benefit from follow up [facility] at discharge.  PT - End of Session Activity Tolerance: Tolerates < 10 min activity with changes in vital signs Endurance Deficit: Yes PT Assessment Rehab Potential (ACUTE/IP ONLY): Good PT Barriers to Discharge: [place] home environment;Home environment access/layout;Incontinence;Wound Care;Insurance for SNF coverage;Weight;Weight bearing restrictions;Behavior PT Patient demonstrates impairments in the following area(s): Balance;Behavior;Endurance;Motor;Perception;Safety;Sensory;Skin Integrity PT Transfers Functional Problem(s): Bed Mobility;Bed to Chair;Car;Furniture PT Locomotion Functional Problem(s): Ambulation;Wheelchair Mobility;Stairs PT Plan PT  Intensity: Minimum of 1-2 x/day ,45 to 90 minutes PT Frequency: 5 out of 7 days PT  Duration Estimated Length of Stay: 14-18 days PT Treatment/Interventions: Ambulation/gait training;Community reintegration;DME/adaptive equipment instruction;Neuromuscular re-education;Psychosocial support;Stair training;UE/LE Strength taining/ROM;Wheelchair propulsion/positioning;UE/LE Coordination activities;Therapeutic Activities;Skin care/wound management;Pain management;Discharge planning;Balance/vestibular training;Cognitive remediation/compensation;Disease management/prevention;Patient/family education;Functional mobility training;Splinting/orthotics;Therapeutic Exercise;Visual/perceptual remediation/compensation PT Recommendation Recommendations for Other Services: Therapeutic Recreation consult Therapeutic Recreation Interventions: Stress management;Kitchen group Follow Up Recommendations: Home health PT Patient destination: Home Equipment Recommended: To be determined   PT Evaluation Precautions/Restrictions Precautions Precautions: Sternal;Fall Precaution Comments: L inattention Restrictions Other Position/Activity Restrictions: sternal precautions - keep hands close to body with mobility General   Vital SignsTherapy Vitals Temp: 98 F (36.7 C) Temp Source: Oral Pulse Rate: 71 Resp: 18 BP: 130/64 Patient Position (if appropriate): Lying Oxygen Therapy SpO2: 93 % O2 Device: Room Air Pain Pain Assessment Pain Scale: 0-10 Pain Score: 0-No pain Pain Interference Pain Interference Pain Effect on Sleep: 0. Does not apply - I have not had any pain or hurting in the past 5 days Pain Interference with Therapy Activities: 1. Rarely or not at all;0. Does not apply - I have not received rehabilitationtherapy in the past 5 days Pain Interference with Day-to-Day Activities: 1. Rarely or not at all Home Living/Prior [place]: Spouse/significant other Available Help at Discharge: Family;Available 24 hours/day Type of Home: House Home Access:  Stairs to enter [employer] of Steps: 3 Home Layout: Two level;Able to live on main level with bedroom/bathroom Bathroom Shower/Tub: Chiropodist: Standard Additional Comments: pt lives in multilevel home; dtr Kathlee Nations) states pt would likely d/c to her home so he could live on one level and she can provide 24/7 assist as needed (home set-up of daughter's home; she works in Personal assistant and has flexible hours)  Lives With: Spouse Prior Function Level of Independence: Independent with basic ADLs;Independent with gait;Independent with transfers  Able to Take Stairs?: Yes Driving: Yes Vocation: Retired Vision/Perception  Vision - History Ability to See in Adequate Light: 1 Impaired Vision - Assessment Additional Comments: unable to follow verbal instructions/ cues; did not track L or turn head to L when requested verbally Perception Perception: Impaired Inattention/Neglect: Does not attend to left visual field;Does not attend to left side of body (requires visual/ tactile cues) Praxis Praxis: Impaired Praxis Impairment Details: Motor planning;Initiation  Cognition Overall Cognitive Status: Impaired/Different from baseline Arousal/Alertness: Awake/alert Orientation Level: Oriented to person Attention: Focused;Sustained Focused Attention: Impaired Focused Attention Impairment: Functional basic Sustained Attention: Impaired Memory: Impaired Awareness: Impaired Problem Solving: Impaired Executive Function: Sequencing;Decision Making;Initiating Sequencing: Impaired Decision Making: Impaired Initiating: Impaired Behaviors: Impulsive Safety/Judgment: Impaired Sensation Sensation Light Touch: Impaired by gross assessment Proprioception: Impaired by gross assessment Coordination Gross Motor Movements are Fluid and Coordinated: No Fine Motor Movements are Fluid and Coordinated: No Coordination and Movement Description: decreased motor control of LUE, limited  grasp Heel Shin Test: repeats instructions, but unable to perform following verbal cues and also following verbal and visual cues Motor  Motor Motor: Other (comment) Motor - Skilled Clinical Observations: mild L hemipareisis with uncoordinated movements, decreased motor planning   Trunk/Postural Assessment  Cervical Assessment Cervical Assessment: Exceptions to [facility] (slightly forward head) Thoracic Assessment Thoracic Assessment: Exceptions to [facility] (rounded shoulders) Lumbar Assessment Lumbar Assessment: Exceptions to [facility] (posterior pelvic tilt in sitting) Postural Control Postural Control: Deficits on evaluation (unable to correct balance shifts)  Balance Balance Balance Assessed: Yes Static Sitting Balance Static Sitting - Level of Assistance: 5: Stand by  assistance Dynamic Sitting Balance Dynamic Sitting - Level of Assistance: 4: Min assist Static Standing Balance Static Standing - Level of Assistance: 3: Mod assist;2: Max assist (not within sternal precautions) Dynamic Standing Balance Dynamic Standing - Level of Assistance: 2: Max assist Extremity Assessment      RLE Assessment General Strength Comments: unable to follow instructions for MMT, functionally RLE strength is grossly WFL LLE Assessment General Strength Comments: unable to follow instructions for MMT, functionally LLE strength is grossly 4/5  Care Tool Care Tool Bed Mobility Roll left and right activity   Roll left and right assist level: Minimal Assistance - Patient > 75%    Sit to lying activity   Sit to lying assist level: Maximal Assistance - Patient 25 - 49%    Lying to sitting on side of bed activity   Lying to sitting on side of bed assist level: the ability to move from lying on the back to sitting on the side of the bed with no back support.: Moderate Assistance - Patient 50 - 74%     Care Tool Transfers Sit to stand transfer   Sit to stand assist level: Moderate Assistance - Patient 50 - 74%     Chair/bed transfer   Chair/bed transfer assist level: Maximal Assistance - Patient 25 - 49%     Toilet transfer   Assist Level: Total Assistance - Patient < 25% (stedy)    Scientist, product/process development transfer activity did not occur: Safety/medical concerns (unable to follow instructions at time of eval)        Care Tool Locomotion Ambulation Ambulation activity did not occur: Safety/medical concerns (dizziness and mild diaphoresis on standing)        Walk 10 feet activity Walk 10 feet activity did not occur: Safety/medical concerns       Walk 50 feet with 2 turns activity Walk 50 feet with 2 turns activity did not occur: Safety/medical concerns      Walk 150 feet activity Walk 150 feet activity did not occur: Safety/medical concerns      Walk 10 feet on uneven surfaces activity Walk 10 feet on uneven surfaces activity did not occur: Safety/medical concerns      Stairs Stair activity did not occur: Safety/medical concerns        Walk up/down 1 step activity Walk up/down 1 step or curb (drop down) activity did not occur: Safety/medical concerns      Walk up/down 4 steps activity Walk up/down 4 steps activity did not occur: Safety/medical concerns      Walk up/down 12 steps activity Walk up/down 12 steps activity did not occur: Safety/medical concerns      Pick up small objects from floor Pick up small object from the floor (from standing position) activity did not occur: Safety/medical concerns      Wheelchair Is the patient using a wheelchair?: Yes Type of Wheelchair: Manual   Wheelchair assist level: Dependent - Patient 0% Max wheelchair distance: 35 ft  Wheel 50 feet with 2 turns activity Wheelchair 50 feet with 2 turns activity did not occur: Safety/medical concerns    Wheel 150 feet activity Wheelchair 150 feet activity did not occur: Safety/medical concerns      Refer to Care Plan for Long Term Goals  SHORT TERM GOAL WEEK 1 PT Short Term Goal 1 (Week 1): Pt will  perform bed mobility with improved initiation, understanding of vc and overall MinA. PT Short Term Goal 2 (Week 1): Pt will perform sit<>stand with  overall Min/ModA while adhering to sternal precautions. PT Short Term Goal 3 (Week 1): Pt will perform stand pivot transfers with overall ModA requring <50% assist with manual weight shift facilitation. PT Short Term Goal 4 (Week 1): Pt will initiate gait training. PT Short Term Goal 5 (Week 1): Pt will initiate step training.  Recommendations for other services: Surveyor, mining group, Stress management, and Outing/community reintegration  Skilled Therapeutic Intervention Mobility Bed Mobility Bed Mobility: Supine to Sit;Sit to Supine Supine to Sit: Minimal Assistance - Patient > 75%;Moderate Assistance - Patient 50-74% Sit to Supine: Minimal Assistance - Patient > 75%;Moderate Assistance - Patient 50-74% Transfers Transfers: Sit to Stand;Stand to Sit;Stand Pivot Transfers Sit to Stand: Minimal Assistance - Patient > 75% Stand to Sit: Moderate Assistance - Patient 50-74%;Maximal Assistance - Patient 25-49% Stand Pivot Transfers: Moderate Assistance - Patient 50 - 74%;Maximal Assistance - Patient 25 - 49% Stand Pivot Transfer Details: Visual cues for safe use of DME/AE;Visual cues/gestures for precautions/safety;Verbal cues for sequencing;Visual cues/gestures for sequencing;Verbal cues for safe use of DME/AE;Verbal cues for gait pattern;Manual facilitation for weight shifting;Manual facilitation for placement;Tactile cues for weight shifting;Tactile cues for sequencing;Tactile cues for placement Transfer (Assistive device): 1 person hand held assist Locomotion  Gait Ambulation: No Gait Gait: No Stairs / Additional Locomotion Stairs: No Wheelchair Mobility Wheelchair Mobility: No  Skilled Intervention: PT Evaluation completed; see above for results. PT educated patient in roles of PT vs OT, PT POC, rehab potential, rehab  goals, and discharge recommendations along with recommendation for follow-up rehabilitation services. Individual treatment initiated:  Patient seated upright in recliner upon PT arrival. Dtr in law present. Patient alert and agreeable to PT session. No pain complaint at start of session.  Therapeutic Activity: Bed mobility and transfers performed as above. Pt demos difficulty with verbal instructions/ cues and initiating requests for sitting/ standing. Impulsive in initiating stance throughout session with requests for other movements. Some lightheadedness endorsed with rise to stand.   Toilet transfer performed with ModA +2 d/t requirement manual weight shift to facilitate step sequencing with multimodal cueing. Pt continent of bowel and logged I/O. MaxA to maintain stance during pericare with +2 for hygiene.   Unable to initiate ambulation d/t inability to follow instructions as well as reduced activity tolerance and lightheadedness.  Unable to follow verbal instructions for formal testing of strength and unable to provide history, PLOF, and home environment. Some improvement with addition of visual demonstration.   Patient seated in w/c  at end of session with brakes locked, belt alarm set, and all needs within reach. Dtr-in-law present and providing supervision. Oriented to time and time of next therapy session with OT in 30 min,    Discharge Criteria: Patient will be discharged from PT if patient refuses treatment 3 consecutive times without medical reason, if treatment goals not met, if there is a change in medical status, if patient makes no progress towards goals or if patient is discharged from hospital.  The above assessment, treatment plan, treatment alternatives and goals were discussed and mutually agreed upon: by patient and by family  Alger Simons PT, DPT, CSRS 10/05/2022, 6:02 PM

## 2022-10-05 NOTE — Progress Notes (Addendum)
Occupational Therapy Session Note  Patient Details  Name: James Estrada MRN: [id number] Date of Birth: 05-22-1941  Today's Date: 10/05/2022 OT Individual Time: 4765-4650 OT Individual Time Calculation (min): 30 min    Short Term Goals: Week 1:  OT Short Term Goal 1 (Week 1): Pt will demonstrate improved L side attention to wash LUE with min cues vs mod cues to attend to arm. OT Short Term Goal 2 (Week 1): Pt will demonstrate improved motor planning to don shirt with min A OT Short Term Goal 3 (Week 1): Pt will demonstrate improved postural control to hold static stand with min A or less. OT Short Term Goal 4 (Week 1): Pt will demonstrate improved L hand function to be able to wash under R arm and hold onto washcloth.  Skilled Therapeutic Interventions/Progress Updates:  Pt received for skilled OT session seated in w/c with daughter-in-law present. Session focused on functional transfers in preparation for ADL tasks.  Pt dependent for transportation from room<>day room. In day room, OT re-educates pt on sternal precautions with verbal/visual demonstration, no carry-over of information noted at this time, as pt continuously attempted to push/pull despite heavy verbal/tactile cueing. Pt performs STS transfer in day room with Min A x 2 to avoid breaking precautions and address decreased initiation/motor planning. Pt unable to perform pivot onto therapy mat, abruptly sitting back in w/c. Fatigue and heavy breathing noted with vitals taken/reading within normal ranges, rest provided as needed. In pt room, pt requires increased time and Min A x2 to perform w/c>EOB>sup, once again, demonstrating an inability to motor plan movements, with trials of multimodal cues needed to complete transfer. OT encouraging awareness towards L-side throughout movements with minimal compliance from patient. Pt observed to be most successful with tactile cues, simple verbal instruction/gestures, and when asked to perform  familiar tasks such as laying down on his bed.   Pt remained supine in bed with all immediate needs meet and bed alarm activated at end of session. Pt continues to be appropriate for skilled OT intervention to address promote further functional independence.      Therapy/Group: Individual Therapy  Lou Cal, OTR/L, MSOT   10/05/2022, 2:26 PM

## 2022-10-05 NOTE — Progress Notes (Signed)
Inpatient Rehabilitation Care Coordinator Assessment and Plan Patient Details  Name: James Estrada MRN: [id number] Date of Birth: 07-09-1941  Today's Date: 10/05/2022  Hospital Problems: Principal Problem:   CVA (cerebral vascular accident) [facility])  Past Medical History:  Past Medical History:  Diagnosis Date   AAA (abdominal aortic aneurysm) (HCC)    last u/s done 07/18/17    Arthritis    Bradycardia    Dysrhythmia    frequent PAC, for 20 years   GERD (gastroesophageal reflux disease)    occ, OTC   Hemorrhoids    History of hiatal hernia    Hyperlipidemia    Hypertension    Seasonal allergies    Past Surgical History:  Past Surgical History:  Procedure Laterality Date   ABDOMINAL AORTIC ENDOVASCULAR STENT GRAFT N/A 05/04/2020   Procedure: ABDOMINAL AORTIC ENDOVASCULAR STENT GRAFT;  Surgeon: Larina Earthly, MD;  Location: [facility] OR;  Service: Vascular;  Laterality: N/A;   CATARACT EXTRACTION Bilateral 2009   DIAGNOSTIC LAPAROSCOPY     laparoscopic hernia repair   EYE SURGERY Bilateral    cataract removal   HERNIA REPAIR Bilateral 1999, 2006   JOINT REPLACEMENT Right ~2018   hip replacement   pheochromocytoma  1993   PROSTATECTOMY N/A 05/15/2013   Procedure: PROSTATECTOMY RETROPUBIC; SIMPLE OPEN PROSTATECTOMY;  Surgeon: Valetta Fuller, MD;  Location: [facility] ORS;  Service: Urology;  Laterality: N/A;   REPAIR OF ACUTE ASCENDING THORACIC AORTIC DISSECTION N/A 09/17/2022   Procedure: REPAIR OF ACUTE ASCENDING THORACIC AORTIC DISSECTION USING 28 MM HEMASHIELD PLATINUM VASCULAR GRAFT;  Surgeon: Loreli Slot, MD;  Location: [facility] OR;  Service: Vascular;  Laterality: N/A;  Median sternotomy   TOTAL ELBOW REPLACEMENT Left    > 30 years ago   TOTAL HIP ARTHROPLASTY Right 09/08/2017   Procedure: RIGHT TOTAL HIP ARTHROPLASTY ANTERIOR APPROACH;  Surgeon: Kathryne Hitch, MD;  Location: [facility];  Service: Orthopedics;  Laterality: Right;   ULTRASOUND GUIDANCE FOR VASCULAR ACCESS  Bilateral 05/04/2020   Procedure: ULTRASOUND GUIDANCE FOR VASCULAR ACCESS;  Surgeon: Larina Earthly, MD;  Location: [facility] OR;  Service: Vascular;  Laterality: Bilateral;   Social History:  reports that he has never smoked. He has never used smokeless tobacco. He reports current alcohol use. He reports that he does not use drugs.  Family / Support Systems Spouse/Significant Other: Lanora Manis Children: Dossie Arbour Anticipated Caregiver: spouse and daughter. Ability/Limitations of Caregiver: Daughter shares she can work from home. Spouse potenially hiring [facility] agency Caregiver Availability: 24/7 Family Dynamics: support form spouse and daughter  Social History Preferred language: English Religion: Statistician - How often do you need to have someone help you when you read instructions, pamphlets, or other written material from your doctor or pharmacy?: Never   Abuse/Neglect Abuse/Neglect Assessment Can Be Completed: Unable to assess, patient is non-responsive or altered mental status  Patient response to: Social Isolation - How often do you feel lonely or isolated from those around you?: Never  Emotional Status Recent Psychosocial Issues: coping Psychiatric History: n/a Substance Abuse History: n/a  Patient / Family Perceptions, Expectations & Goals Pt/Family understanding of illness & functional limitations: yes Premorbid pt/family roles/activities: Active, independent and working Anticipated changes in roles/activities/participation: Family anticpates assisting and providing supervision at d/c. Spouse potentially will hire HC. Pt/family expectations/goals: Supervision  Community Resources Premorbid Home Care/DME Agencies: None Transportation available at discharge: Daughter able to transport Is the patient able to respond to transportation needs?: Yes In the past 12 months, has  lack of transportation kept you from medical appointments or from getting medications?: No In  the past 12 months, has lack of transportation kept you from meetings, work, or from getting things needed for daily living?: No Resource referrals recommended: Neuropsychology (coping)  Discharge Planning Living Arrangements: Spouse/significant other Support Systems: Spouse/significant other, Children, Home care staff Type of Residence: Private residence (Daughter home (one level)) Insurance Resources: Media planner (specify) (BCBS MEDICARE) Surveyor, quantity Resources: Employment, Garment/textile technologist Screen Referred: No Living Expenses: Banker Management: Patient, Spouse Does the patient have any problems obtaining your medications?: No Home Management: Independent Patient/Family Preliminary Plans: Spouse and daughter able to help manage cogntive tasks Care Coordinator Barriers to Discharge: Decreased caregiver support, Lack of/limited family support, Home environment access/layout Care Coordinator Barriers to Discharge Comments: Patient lives in multilevel home. Patient discharging to daughters home. Care Coordinator Anticipated Follow Up Needs: HH/OP Expected length of stay: 12 Days  Clinical Impression Sw spoke with patient spouse, Lanora Manis introduced self, and explained role. Spouse concerned about the care needs of the patient at home. They anticipate discharging home with daughter who lives in a one level home. Daughter works in Research officer, political party and able to assist at home but spouse considering hiring [facility] resources to help assist them in the home. SW will leave resources for spouse in room. No additional questions or concerns currently.   Andria Rhein 10/05/2022, 3:44 PM

## 2022-10-05 NOTE — Progress Notes (Signed)
Inpatient Rehabilitation Center Individual Statement of Services  Patient Name:  James Estrada  Date:  10/05/2022  Welcome to the Inpatient Rehabilitation Center.  Our goal is to provide you with an individualized program based on your diagnosis and situation, designed to meet your specific needs.  With this comprehensive rehabilitation program, you will be expected to participate in at least 3 hours of rehabilitation therapies Monday-Friday, with modified therapy programming on the weekends.  Your rehabilitation program will include the following services:  Physical Therapy (PT), Occupational Therapy (OT), Speech Therapy (ST), 24 hour per day rehabilitation nursing, Therapeutic Recreaction (TR), Neuropsychology, Care Coordinator, Rehabilitation Medicine, Nutrition Services, Pharmacy Services, and Other  Weekly team conferences will be held on Wednesdays to discuss your progress.  Your Inpatient Rehabilitation Care Coordinator will talk with you frequently to get your input and to update you on team discussions.  Team conferences with you and your family in attendance may also be held.  Expected length of stay: 12 Days  Overall anticipated outcome:  Supervision Level  Depending on your progress and recovery, your program may change. Your Inpatient Rehabilitation Care Coordinator will coordinate services and will keep you informed of any changes. Your Inpatient Rehabilitation Care Coordinator's name and contact numbers are listed  below.  The following services may also be recommended but are not provided by the Inpatient Rehabilitation Center:   Home Health Rehabiltiation Services Outpatient Rehabilitation Services    Arrangements will be made to provide these services after discharge if needed.  Arrangements include referral to agencies that provide these services.  Your insurance has been verified to be:   BCBS MEDICARE Your primary doctor is:  Renford Dills, MD  Pertinent  information will be shared with your doctor and your insurance company.  Inpatient Rehabilitation Care Coordinator:  Lavera Guise, [place] [phone] or [phone]  Information discussed with and copy given to patient by: Andria Rhein, 10/05/2022, 9:06 AM

## 2022-10-05 NOTE — Evaluation (Addendum)
Speech Language Pathology Assessment and Plan  Patient Details  Name: James Estrada MRN: [id number] Date of Birth: 30-Mar-1941  SLP Diagnosis: Aphasia;Speech and Language deficits;Dysphagia;Cognitive Impairments  Rehab Potential: Good ELOS: ~3 weeks    Today's Date: 10/05/2022 SLP Individual Time: 0865-7846 SLP Individual Time Calculation (min): 60 min   Hospital Problem: Principal Problem:   CVA (cerebral vascular accident) [facility])  Past Medical History:  Past Medical History:  Diagnosis Date   AAA (abdominal aortic aneurysm) ([place])    last u/s done 07/18/17    Arthritis    Bradycardia    Dysrhythmia    frequent PAC, for 20 years   GERD (gastroesophageal reflux disease)    occ, OTC   Hemorrhoids    History of hiatal hernia    Hyperlipidemia    Hypertension    Seasonal allergies    Past Surgical History:  Past Surgical History:  Procedure Laterality Date   ABDOMINAL AORTIC ENDOVASCULAR STENT GRAFT N/A 05/04/2020   Procedure: ABDOMINAL AORTIC ENDOVASCULAR STENT GRAFT;  Surgeon: [name], MD;  Location: [place];  Service: Vascular;  Laterality: N/A;   CATARACT EXTRACTION Bilateral 2009   DIAGNOSTIC LAPAROSCOPY     laparoscopic hernia repair   EYE SURGERY Bilateral    cataract removal   HERNIA REPAIR Bilateral 1999, 2006   JOINT REPLACEMENT Right ~2018   hip replacement   pheochromocytoma  1993   PROSTATECTOMY N/A 05/15/2013   Procedure: PROSTATECTOMY RETROPUBIC; SIMPLE OPEN PROSTATECTOMY;  Surgeon: [name], MD;  Location: [facility];  Service: Urology;  Laterality: N/A;   REPAIR OF ACUTE ASCENDING THORACIC AORTIC DISSECTION N/A 09/17/2022   Procedure: REPAIR OF ACUTE ASCENDING THORACIC AORTIC DISSECTION USING 28 MM HEMASHIELD PLATINUM VASCULAR GRAFT;  Surgeon: [name], MD;  Location: [place];  Service: Vascular;  Laterality: N/A;  Median sternotomy   TOTAL ELBOW REPLACEMENT Left    > 30 years ago   TOTAL HIP ARTHROPLASTY Right 09/08/2017   Procedure:  RIGHT TOTAL HIP ARTHROPLASTY ANTERIOR APPROACH;  Surgeon: [name], MD;  Location: [facility];  Service: Orthopedics;  Laterality: Right;   ULTRASOUND GUIDANCE FOR VASCULAR ACCESS Bilateral 05/04/2020   Procedure: ULTRASOUND GUIDANCE FOR VASCULAR ACCESS;  Surgeon: [name], MD;  Location: [facility] OR;  Service: Vascular;  Laterality: Bilateral;    Assessment / Plan / Recommendation Clinical Impression Patient Admitting Diagnosis: Aortic dissection, postop CVA  History of Present Illness: 81 year old male with history of known AAA  s/p stent graft, arthritis, bradycardia, HLD and HTN presented on 09/17/22. Sudden onset of bilateral leg weakness and collapse with chest and left upper back pain. CT angio shows Type 1 AAA dissection. Emergent repair on 09/17/22. Postoperative CT head on 11/6 showed 3 small right sided CVAs in cerebellum, right basal ganglia and frontal lobe with a 4 mm are of bleeding. CTA right carotid with dissection. Extubated on 09/22/22.  Heart Failure team consulted. Post operative PAF treated with IV amio and transitioned to PO. Felt no anticoagulation due to hemorrhagic component of Stroke. Pericardial effusion with repeat limited echo 11/14 with trivial effusion. HTN treated with losartan and lopressor.  Developed sepsis on 11/17 that responded to IVF and antibiotics. Cultures negative. Wbcs as high as 51.8. Felt due to urinary retention and antibiotics narrowed to Zosyn. Foley placed 11/17, and began Flomax. Scr became elevated with sepsis that responded to Lasix. Complete NIHSS TOTAL: 5  SLP consulted to complete clinical swallow and speech and language evaluation in the  setting of acute R CVA. Pt encountered awake/alert and lying semi-reclined in bed. Agreeable to evaluation at bedside. Participatory with cues. Pt's wife present.  Per formal and informal assessment measures, pt presents with functional deficits in verbal expression + auditory comprehension c/b non-fluent  aphasia, most consistent with transcortical motor subtype (moderate to severe impairment), oral apraxia/motor planning (mild-moderate), deglutition (mild), and cognition (difficult to fully assess due to language deficits).   At this time, pt benefits from mod-max assistance for intermittently following non-contextual one-step directives (~40% of the time); improved command following within functional context.  Yes/no responses are inaccurate at times, with pt exhibiting a "yes" bias. Difficulty with responsive and confrontation naming tasks noted, as well as with reading single word choices. Unable to write name. Cognitively, pt exhibited decreased sustained attention, orientation (only oriented to self), recall, and problem-solving deficits and was unable to compensate for current deficits despite max assistance. Pt does attempt verbalizations; however, appears more automatic in nature with decreased MLU noted. Repetition is a strength of pt's. Written choice prompts did not appear significantly helpful this date.  Per CSE, pt exhibits prolonged oral manipulation and mastication with decreased mandibular ROM when consuming Dysphagia 2 textures. Consumed thin liquids via cup and straw with intermittent seemingly delayed swallow initiation. Aforementioned observations appear consistent with MBSS findings on 09/29/2022. Intermittent throat clearing noted, post-prandially, with no change in vocal quality appreciated. Per MBSS results from 11/16, trace-transient penetration observed with mixed consistencies. Pt benefited from min verbal cues to clear oral cavity prior to liquid rinse. ? If throat clearing was a result of trace-transient penetration noted during MBSS. Will continue to monitor.  Prior to admission, pt was did not exhibit any speech nor language deficits, and was independent and living with his wife.  Given assessment findings, recommend skilled ST interventions to maximize pt's independence,  quality of life, and decrease caregiver burden. Recommend continuation of current diet textures (Dysphagia 2 and thin liquids with full supervision and upright positioning). Results and recommendations were reviewed with pt and pt's wife who verbalized understanding and agreement with proposed ST POC. Please see below for objective findings.  MS Aphasia Screening Test Naming: 6/10 Automatic Speech: 8/10 Repetition: 10/10 Yes/No Responses: 10/20 Following Instructions: 0/10 Reading: 0/10   Skilled Therapeutic Interventions          CSE + formal and informal speech-language evaluation administered to include MS Aphasia Screening Test. Please see above for details.   SLP Assessment  Patient will need skilled Speech Lanaguage Pathology Services during CIR admission    Recommendations  SLP Diet Recommendations: Dysphagia 2 (Fine chop);Thin Liquid Administration via: Cup;Straw Medication Administration: Crushed with puree Supervision: Staff to assist with self feeding;Full supervision/cueing for compensatory strategies Compensations: Minimize environmental distractions;Slow rate;Small sips/bites;Lingual sweep for clearance of pocketing Postural Changes and/or Swallow Maneuvers: Seated upright 90 degrees;Upright 30-60 min after meal Oral Care Recommendations: Oral care BID Patient destination: Home Follow up Recommendations: Home Health SLP;Outpatient SLP;24 hour supervision/assistance Equipment Recommended: None recommended by SLP    SLP Frequency 3 to 5 out of 7 days   SLP Duration  SLP Intensity  SLP Treatment/Interventions ~3 weeks  Minumum of 1-2 x/day, 30 to 90 minutes  Functional tasks;Patient/family education;Speech/Language facilitation;Dysphagia/aspiration precaution training    Pain Pain Assessment Pain Scale: Faces Pain Score: 0-No pain Faces Pain Scale: No hurt  Prior Functioning Cognitive/Linguistic Baseline: Within functional limits Type of Home: House  Lives  With: Spouse Available Help at Discharge: Family;Available 24 hours/day Vocation: Retired  SLP Evaluation Cognition Overall Cognitive Status: Impaired/Different from baseline Arousal/Alertness: Awake/alert Orientation Level: Oriented to person Attention: Focused;Sustained Focused Attention: Impaired Focused Attention Impairment: Verbal basic;Functional basic Sustained Attention: Impaired Sustained Attention Impairment: Verbal basic;Functional basic Memory: Impaired Memory Impairment: Retrieval deficit Awareness: Impaired Awareness Impairment: Intellectual impairment Problem Solving: Impaired Problem Solving Impairment: Verbal basic;Functional basic Behaviors: Impulsive Safety/Judgment: Impaired  Comprehension Auditory Comprehension Overall Auditory Comprehension: Impaired Yes/No Questions: Impaired Basic Biographical Questions: 26-50% accurate Basic Immediate Environment Questions: 50-74% accurate Commands: Impaired One Step Basic Commands: 25-49% accurate (~40%) Two Step Basic Commands: 0-24% accurate (Pt unable) Conversation: Simple Interfering Components: Attention;Motor planning EffectiveTechniques: Extra processing time;Repetition;Visual/Gestural cues Visual Recognition/Discrimination Discrimination: Not tested Reading Comprehension Reading Status: Impaired Word level: Impaired Interfering Components: Attention;Left neglect/inattention Effective Techniques: Verbal cueing Expression Expression Primary Mode of Expression: Verbal Verbal Expression Overall Verbal Expression: Impaired Initiation: Impaired Automatic Speech: Day of week;Counting (required cueing) Level of Generative/Spontaneous Verbalization: Sentence Repetition: No impairment Naming: Impairment Responsive: 26-50% accurate Confrontation: Impaired Divergent: Not tested Pragmatics: Impairment Impairments: Monotone Interfering Components: Attention Effective Techniques: Sentence completion;Phonemic  cues Non-Verbal Means of Communication: Not applicable Written Expression Dominant Hand: Left Written Expression: Unable to assess (comment) (Pt unable to write name) Oral Motor Oral Motor/Sensory Function Overall Oral Motor/Sensory Function:  (unable to fully assess due to oral apraxia) Facial ROM: Within Functional Limits Facial Symmetry: Within Functional Limits Lingual ROM: Within Functional Limits Lingual Symmetry: Within Functional Limits Motor Speech Overall Motor Speech: Appears within functional limits for tasks assessed  Care Tool Care Tool Cognition Ability to hear (with hearing aid or hearing appliances if normally used Ability to hear (with hearing aid or hearing appliances if normally used): 0. Adequate - no difficulty in normal conservation, social interaction, listening to TV   Expression of Ideas and Wants Expression of Ideas and Wants: 2. Frequent difficulty - frequently exhibits difficulty with expressing needs and ideas   Understanding Verbal and Non-Verbal Content Understanding Verbal and Non-Verbal Content: 2. Sometimes understands - understands only basic conversations or simple, direct phrases. Frequently requires cues to understand  Memory/Recall Ability Memory/Recall Ability : None of the above were recalled    Bedside Swallowing Assessment General Date of Onset: 09/17/22 Previous Swallow Assessment: BSE 09/27/22 with NPO status recommended d/t overt s/sx of aspiration; MBSS on 11/16 - recs for D2 and thin liquids Diet Prior to this Study: Dysphagia 2 (chopped);Thin liquids Temperature Spikes Noted: No Respiratory Status: Room air History of Recent Intubation: Yes Length of Intubations (days): 5 days Date extubated: 09/22/22 Behavior/Cognition: Alert;Cooperative;Pleasant mood;Confused;Requires cueing Oral Cavity - Dentition: Adequate natural dentition Self-Feeding Abilities: Able to feed self;Needs assist;Needs set up Patient Positioning: Upright in  bed Baseline Vocal Quality: Normal Volitional Cough: Weak Volitional Swallow: Able to elicit  Ice Chips Ice chips: Not tested Thin Liquid Thin Liquid: Impaired Presentation: Cup;Straw Pharyngeal  Phase Impairments: Throat Clearing - Delayed Nectar Thick Nectar Thick Liquid: Not tested Honey Thick Honey Thick Liquid: Not tested Puree Puree: Within functional limits Presentation: Self Fed;Spoon (with assistance) Solid Solid: Within functional limits (D2) Presentation: Self Fed BSE Assessment Risk for Aspiration Impact on safety and function: Mild aspiration risk;Moderate aspiration risk Other Related Risk Factors: Cognitive impairment;Prolonged intubation  Short Term Goals: Week 1: SLP Short Term Goal 1 (Week 1): Pt will answer basic and semi-complex yes/no questions with 90% accuracy given Min A. SLP Short Term Goal 2 (Week 1): Pt will follow one-step commands on 80% of trials given Mod A multinodal cues. SLP Short Term Goal 3 (Week  1): Pt will match object or picture to word with 90% accuracy given Min A. SLP Short Term Goal 4 (Week 1): Pt will participate in responsive naming tasks with 90% given Mod A multimodal cues. SLP Short Term Goal 5 (Week 1): Pt will effectively communicate wants, needs, and ideas for ~80% of session with Mod A. SLP Short Term Goal 6 (Week 1): Pt will participate in therapeutic trials of Dysphagia 3 textures and thin liquids with minimal s/sx concerning for aspiration with Min A.  Refer to Care Plan for Long Term Goals  Recommendations for other services: None   Discharge Criteria: Patient will be discharged from SLP if patient refuses treatment 3 consecutive times without medical reason, if treatment goals not met, if there is a change in medical status, if patient makes no progress towards goals or if patient is discharged from hospital.  The above assessment, treatment plan, treatment alternatives and goals were discussed and mutually agreed upon:  by patient and by family  Romelle Starcher A Tola Meas 10/05/2022, 1:08 PM

## 2022-10-05 NOTE — Patient Care Conference (Signed)
Inpatient RehabilitationTeam Conference and Plan of Care Update Date: 10/05/2022   Time: 10:20 AM    Patient Name: James Estrada      Medical Record Number: [id number]  Date of Birth: [birth year] Sex: Male         Room/Bed: 4W12C/4W12C-02 Payor Info: Payor: [employer]ICARE / Plan: [employer] MEDICARE / Product Type: *No Product type* /    Admit Date/Time:  10/04/2022  4:08 PM  Primary Diagnosis:  CVA (cerebral vascular accident) [facility])  Hospital Problems: Principal Problem:   CVA (cerebral vascular accident) [facility])    Expected Discharge Date: Expected Discharge Date:  (ELOS ~3 wks, reconf)  Team Members Present: Physician leading conference: Dr. Fanny Dance Social Worker Present: Lavera Guise, BSW Nurse Present: Chana Bode, RN PT Present: Ralph Leyden, PT OT Present: Primitivo Gauze, OT SLP Present: Eilene Ghazi, Christ Kick, SLP PPS Coordinator present : Fae Pippin, SLP     Current Status/Progress Goal Weekly Team Focus  Bowel/Bladder   Incontinent x 2. Currently has 16 fr. foley in place. Increase output/frequency urine.   Work towards d/c foley cather. Return to baseline with bladder and bowel.   Assist with toileting needs prn. Maintain peri and foley care Qhs and PRN.    Swallow/Nutrition/ Hydration               ADL's   eval pending            Mobility   Eval pending today 11/22           Communication                Safety/Cognition/ Behavioral Observations  eval pending            Pain   Endorses no pain. Decrease sensation. FACE score for pain.   Continue to maintain pain level at "0"   Assess Qshift and PRN    Skin   Incsion to sternum and right groin area. Sternum precautions.   Maintain skin integrity and continue to prevent infections  Assess Qshift and PRN      Discharge Planning:  Evals pending   Team Discussion: Patient with aphasia, perseveration, right gaze preference and lower  extremity weakness post CVA/aortic dissection with hx of HF, HTN, Afib on amiodarone, and psoas muscle hematoma and IV abx for leukocytosis.   Patient on target to meet rehab goals: yes, currently needs max assist to ambulate up to 4' with sternal precautions. Other evals pending.  *See Care Plan and progress notes for long and short-term goals.   Revisions to Treatment Plan:  Foley removal; voiding trial on flomax   Teaching Needs: Safety, medications, secondary risk management, dietary modificiation, transfers, toileting, etc.  Current Barriers to Discharge: Decreased caregiver support  Possible Resolutions to Barriers: Family education     Medical Summary Current Status: CVA, aortic dissection, Afib, heart failure, leukoctytosis, aki, agitation  Barriers to Discharge: Behavior/Mood;Medical stability  Barriers to Discharge Comments: CVA, aortic dissection, Afib, heart failure, leukoctytosis, aki, agitation Possible Resolutions to [employer] Focus: monitor BP, consider seroquel, follow weight, continue modified diet   Continued Need for Acute Rehabilitation Level of Care: The patient requires daily medical management by a physician with specialized training in physical medicine and rehabilitation for the following reasons: Direction of a multidisciplinary physical rehabilitation program to maximize functional independence : Yes Medical management of patient stability for increased activity during participation in an intensive rehabilitation regime.: Yes Analysis of laboratory values and/or radiology reports with  any subsequent need for medication adjustment and/or medical intervention. : Yes   I attest that I was present, lead the team conference, and concur with the assessment and plan of the team.   Dorien Chihuahua B 10/05/2022, 3:17 PM

## 2022-10-05 NOTE — Progress Notes (Addendum)
PROGRESS NOTE   Subjective/Complaints:  No acute complaints. No events overnight. Patient has significant receptive and expressive aphasia. Per wife, no concerns today.  ROS: Limited d/t aphasia  Objective:   No results found. Recent Labs    10/04/22 0052 10/05/22 0618  WBC 16.4* 13.9*  HGB 8.2* 8.3*  HCT 26.0* 25.9*  PLT 312 310   Recent Labs    10/04/22 0052 10/05/22 0618  NA 138 137  K 4.0 3.9  CL 107 107  CO2 22 23  GLUCOSE 121* 120*  BUN 28* 18  CREATININE 1.37* 1.08  CALCIUM 8.2* 8.1*    Intake/Output Summary (Last 24 hours) at 10/05/2022 1224 Last data filed at 10/05/2022 0848 Gross per 24 hour  Intake 723 ml  Output 5250 ml  Net -4527 ml        Physical Exam: Vital Signs Blood pressure 136/69, pulse 72, temperature 98.4 F (36.9 C), temperature source Oral, resp. rate 15, weight 77.9 kg, SpO2 95 %. Physical Exam Vitals and nursing note reviewed. Exam conducted with a chaperone present.  Constitutional:      General: He is not in acute distress.    HENT:     Head: Normocephalic and atraumatic. + L facial droop- mild   Eyes:     Pupils: Pupils are equal, round, and reactive to light.     Comments: R gaze preference; difficulty looking left  Cardiovascular:     Rate and Rhythm: Normal rate and regular rhythm. +Murmur  Pulmonary:     Effort: Pulmonary effort is normal. No respiratory distress.     Breath sounds: Normal breath sounds. No stridor. No wheezing or rales.  Abdominal:     General: Bowel sounds are normal. There is no distension.     Palpations: Abdomen is soft.     Tenderness: There is no abdominal tenderness.   Musculoskeletal:  4/5 L upper and lower extremity throughout 5/5 R upper and lower extremity throughout  Skin:    General: Skin is warm and dry. No lesions  Neurological:     Mental Status: Follows 0/3 verbal commands, 1/3 visual cues. Oriented to self only.    Language: + Echololalia + perseveration.  Fluent language.    CN exam: + L facial droop. + L face sensory deficit. Otherwise intact.   Psychiatric:     Comments: Flat        Assessment/Plan: 1. Functional deficits which require 3+ hours per day of interdisciplinary therapy in a comprehensive inpatient rehab setting. Physiatrist is providing close team supervision and 24 hour management of active medical problems listed below. Physiatrist and rehab team continue to assess barriers to discharge/monitor patient progress toward functional and medical goals  Care Tool:  Bathing              Bathing assist       Upper Body Dressing/Undressing Upper body dressing        Upper body assist      Lower Body Dressing/Undressing Lower body dressing            Lower body assist       Toileting Toileting    Toileting assist  Transfers Chair/bed transfer  Transfers assist           Locomotion Ambulation   Ambulation assist              Walk 10 feet activity   Assist           Walk 50 feet activity   Assist           Walk 150 feet activity   Assist           Walk 10 feet on uneven surface  activity   Assist           Wheelchair     Assist               Wheelchair 50 feet with 2 turns activity    Assist            Wheelchair 150 feet activity     Assist          Blood pressure 136/69, pulse 72, temperature 98.4 F (36.9 C), temperature source Oral, resp. rate 15, weight 77.9 kg, SpO2 95 %.    Medical Problem List and Plan: 1. Functional deficits secondary to R brain strokes x3- frontal, BG and cerebellum with L hemiparesis and aphasia s/p Aortic dissection             -patient may  shower             -ELOS/Goals: 12-14 days supervision- needs PT, OT and SLP- might have language center on R side 2.  Antithrombotics: -DVT/anticoagulation: If atrial fibrillation recurs, may need  to consider AC             -antiplatelet therapy: aspirin 81 mg daily             SCDs and TEDs; consider starting Lovenox 3. Pain Management: Tylenol as needed 4. Mood/Behavior/Sleep: LCSW to evaluate and provide emotional support             -antipsychotic agents: n/a 5. Neuropsych/cognition: This patient is not capable of making decisions on his own behalf. 6. Skin/Wound Care: Routine skin care checks  7. Fluids/Electrolytes/Nutrition: Strict Is and Os and follow-up chemistries             -daily weight             -continue dysphagia 2 diet/thins; SLP eval             -continue feeding supplement   8: Heart failure with pEF (see meds below)             -monitor Mg and K             -daily weight (weight range 65kg)             -follow-up with Dr. Sherlynn Stalls Weights   10/05/22 0451  Weight: 77.9 kg    - monitor for fluctuations; does not appear fluid up, may be inaccurate reading  9: Leukocytosis/leukemoid reaction: Zosyn to finish 11/21 -starting Augmentin BID for 10 days             -follow-up CBC - downtrending   10: Post-op atrial fibrillation:  -continue amiodarone 200 mg BID for 7 days then 200 mg daily             -if recurs, consider AC  11: Left renal artery stenosis: follow-up as outpatient 12: Acute blood loss anemia: follow-up CBC  - stable 13: Hyperlipidemia: continue Lipitor 40 mg daily 14: GERD/HH: no meds (  Pepcid discontinued) 15: Hypertension: montior TID and prn             -continue Lopressor 12.5 mg BID             -restart losartan 50 mg daily, Spironolactone 12.5 mg as BP allows 16: Acute urinary retention:             -has foley cath - Dced on admission, sarting PVRs Q6H for voiding trial             -continue Flomax 0.4 mg daily  17: Acute kidney injury: serum creatinine improving; follow-up BMP  - stable  18. Agitation/confusion/Negative mood- per family- didn't see myself- might need Seroquel vs Depakote to help with calming pt down -  No issues overnight  LOS: 1 days A FACE TO FACE EVALUATION WAS PERFORMED  Gertie Gowda 10/05/2022, 12:24 PM

## 2022-10-05 NOTE — Plan of Care (Signed)
  Problem: RH Swallowing Goal: LTG Patient will consume least restrictive diet using compensatory strategies with assistance (SLP) Description: LTG:  Patient will consume least restrictive diet using compensatory strategies with assistance (SLP) Flowsheets (Taken 10/05/2022 1310) LTG: Pt Patient will consume least restrictive diet using compensatory strategies with assistance of (SLP): Supervision Goal: LTG Pt will demonstrate functional change in swallow as evidenced by bedside/clinical objective assessment (SLP) Description: LTG: Patient will demonstrate functional change in swallow as evidenced by bedside/clinical objective assessment (SLP) Flowsheets (Taken 10/05/2022 1310) LTG: Patient will demonstrate functional change in swallow as evidenced by bedside/clinical objective assessment: Oropharyngeal swallow   Problem: RH Comprehension Communication Goal: LTG Patient will comprehend basic/complex auditory (SLP) Description: LTG: Patient will comprehend basic/complex auditory information with cues (SLP). Flowsheets (Taken 10/05/2022 1310) LTG: Patient will comprehend: Basic auditory information LTG: Patient will comprehend auditory information with cueing (SLP):  Minimal Assistance - Patient > 75%  Moderate Assistance - Patient 50 - 74%   Problem: RH Expression Communication Goal: LTG Patient will express needs/wants via multi-modal(SLP) Description: LTG:  Patient will express needs/wants via multi-modal communication (gestures/written, etc) with cues (SLP) Flowsheets (Taken 10/05/2022 1310) LTG: Patient will express needs/wants via multimodal communication (gestures/written, etc) with cueing (SLP): Minimal Assistance - Patient > 75% Goal: LTG Patient will verbally express basic/complex needs(SLP) Description: LTG:  Patient will verbally express basic/complex needs, wants or ideas with cues  (SLP) Flowsheets (Taken 10/05/2022 1310) LTG: Patient will verbally express basic/complex needs,  wants or ideas (SLP): Minimal Assistance - Patient > 75% Goal: LTG Patient will increase word finding of common (SLP) Description: LTG:  Patient will increase word finding of common objects/daily info/abstract thoughts with cues using compensatory strategies (SLP). Flowsheets (Taken 10/05/2022 1310) LTG: Patient will increase word finding of common (SLP):  Minimal Assistance - Patient > 75%  Moderate Assistance - Patient 50 - 74% Patient will use compensatory strategies to increase word finding of: Common objects

## 2022-10-05 NOTE — Evaluation (Signed)
Occupational Therapy Assessment and Plan  Patient Details  Name: James Estrada MRN: [id number] Date of Birth: 1941/02/21  OT Diagnosis: apraxia, cognitive deficits, disturbance of vision, and hemiplegia affecting dominant side Rehab Potential: Rehab Potential (ACUTE ONLY): Good ELOS: 3 weeks   Today's Date: 10/05/2022 OT Individual Time: 1030-1130 OT Individual Time Calculation (min): 60 min     Hospital Problem: Principal Problem:   CVA (cerebral vascular accident) [facility])   Past Medical History:  Past Medical History:  Diagnosis Date   AAA (abdominal aortic aneurysm) ([place])    last u/s done 07/18/17    Arthritis    Bradycardia    Dysrhythmia    frequent PAC, for 20 years   GERD (gastroesophageal reflux disease)    occ, OTC   Hemorrhoids    History of hiatal hernia    Hyperlipidemia    Hypertension    Seasonal allergies    Past Surgical History:  Past Surgical History:  Procedure Laterality Date   ABDOMINAL AORTIC ENDOVASCULAR STENT GRAFT N/A 05/04/2020   Procedure: ABDOMINAL AORTIC ENDOVASCULAR STENT GRAFT;  Surgeon: [name], MD;  Location: [place];  Service: Vascular;  Laterality: N/A;   CATARACT EXTRACTION Bilateral 2009   DIAGNOSTIC LAPAROSCOPY     laparoscopic hernia repair   EYE SURGERY Bilateral    cataract removal   HERNIA REPAIR Bilateral 1999, 2006   JOINT REPLACEMENT Right ~2018   hip replacement   pheochromocytoma  1993   PROSTATECTOMY N/A 05/15/2013   Procedure: PROSTATECTOMY RETROPUBIC; SIMPLE OPEN PROSTATECTOMY;  Surgeon: [name], MD;  Location: [facility];  Service: Urology;  Laterality: N/A;   REPAIR OF ACUTE ASCENDING THORACIC AORTIC DISSECTION N/A 09/17/2022   Procedure: REPAIR OF ACUTE ASCENDING THORACIC AORTIC DISSECTION USING 28 MM HEMASHIELD PLATINUM VASCULAR GRAFT;  Surgeon: [name], MD;  Location: [place];  Service: Vascular;  Laterality: N/A;  Median sternotomy   TOTAL ELBOW REPLACEMENT Left    > 30 years ago   TOTAL HIP  ARTHROPLASTY Right 09/08/2017   Procedure: RIGHT TOTAL HIP ARTHROPLASTY ANTERIOR APPROACH;  Surgeon: [name], MD;  Location: [facility];  Service: Orthopedics;  Laterality: Right;   ULTRASOUND GUIDANCE FOR VASCULAR ACCESS Bilateral 05/04/2020   Procedure: ULTRASOUND GUIDANCE FOR VASCULAR ACCESS;  Surgeon: [name], MD;  Location: [facility] OR;  Service: Vascular;  Laterality: Bilateral;    Assessment & Plan Clinical Impression:  [name] si a 81 year old L handed male who presented from home to the emergency department via EMS on 09/17/2022 with acute onset of bilateral leg weakness and left upper back pain.  The patient's medical history is significant for multiple joint replacements and abdominal aortic aneurysm status post stent graft placement July, 2021.  Neurology was consulted.  Stat CT examination of the head revealed no acute abnormality.  CT dissection protocol revealed acute aortic dissection with retroperitoneal blood adjacent to the left psoas muscle.  Emergent aortic dissection repair with effort to spare the artery supplying the spinal cord emergently necessary and the patient was admitted by Dr. Roxan Hockey.  The patient was counseled regarding emergent need of repair of type I dissection.  He underwent placement of 30 mm Hemashield graft by Dr. Roxan Hockey on 11/5 and was transferred to the ICU.  Rapid wean protocol initiated.  Hypertension hypokalemia addressed.  Elevated serum creatinine consistent with acute kidney injury.  No Lovenox given secondary to thrombocytopenia.  SCDs for DVT prophylaxis.  Advanced heart failure team consulted 11/6.  Remained  on Cardene infusion for blood pressure control.  Aspirin and beta-blocker ordered.  Lasix infusion for diuresis.  Postoperative paroxysmal atrial fibrillation noted and placed on amiodarone infusion.  Neurology continued to follow the patient due to extremity weakness.  CT head performed 11/6 revealed 3 small right-sided strokes  1 in the cerebellum and 1 in the right basal ganglia as well as 1 in the frontal lobe with a 4 mm area of bleeding.  CTA without large vessel occlusion.  He exhibited left-sided weakness.  EEG performed.  Hyperglycemia treated and resolved.  Tube feedings initiated 11/7. Exhibited some agitation and was left intubated.  PCCM consulted on 11/8 for assistance with ventilator weaning.  He was extubated on 11/9.  Lopressor started for blood pressure control.  MRI brain performed 11/11 which showed numerous bilateral foci of acute ischemia right greater than left and MCA and PCA territories as well as the cerebellum.  Neurology continued to follow the patient and recommends aspirin 81 mg daily and if atrial fibrillation recurs to consider anticoagulation.  No statin needed given that LDL is at goal.  Diuretics held 11/12.  He remained in normal sinus rhythm on amiodarone.  AKI likely secondary to ATN from hemorrhagic shock/hypotension continued to improve.  Blood pressure slowly climbed and permissive hypertension allowed until 11/14.  Losartan 25 mg daily then added.  Later that day, he went into atrial fibrillation with rapid ventricular response and oral amiodarone was discontinued and bolused and started on amiodarone infusion.  He converted back into sinus rhythm.  Core track tube placed.  Hemoglobin remained stable.  White blood cell count trended upward and urinalysis was negative.  Chest x-ray with bibasilar atelectasis versus infiltrate.  Treated with vancomycin and Zosyn.  Was found to have urinary retention and a Foley catheter was placed.  He experienced diarrhea and C. difficile toxin was negative.  Metoprolol continued 12.5 mg BID. Losartan and spironolactone started and discontinued. Modified barium swallow completed and p.o. intake initiated.  He is currently tolerating dysphagia 2 diet with thin liquids.  Surgical incisions are healing without signs of infection.  He is now on amiodarone 200 mg orally  twice daily.  He will transition to Augmentin for 10 days.The patient requires inpatient physical medicine and rehabilitation evaluations and treatment secondary to dysfunction due to embolic stroke.   Patient transferred to CIR on 10/04/2022 .    Patient currently requires max with basic self-care skills secondary to decreased cardiorespiratoy endurance, motor apraxia, decreased visual perceptual skills and decreased visual motor skills, decreased attention to left, decreased attention, decreased awareness, decreased problem solving, decreased safety awareness, and decreased memory, and decreased sitting balance, decreased standing balance, decreased postural control, and hemiplegia.  Prior to hospitalization, patient could complete ADLS with independent .  Patient will benefit from skilled intervention to increase independence with basic self-care skills prior to discharge home with care partner.  Anticipate patient will require minimal physical assistance and follow up home health.  OT - End of Session Activity Tolerance: Tolerates 30+ min activity without fatigue OT Assessment Rehab Potential (ACUTE ONLY): Good OT Patient demonstrates impairments in the following area(s): Balance;Cognition;Endurance;Motor;Perception;Safety;Vision OT Basic ADL's Functional Problem(s): Eating;Grooming;Bathing;Dressing;Toileting OT Transfers Functional Problem(s): Toilet;Tub/Shower OT Additional Impairment(s): Fuctional Use of Upper Extremity OT Plan OT Intensity: Minimum of 1-2 x/day, 45 to 90 minutes OT Frequency: 5 out of 7 days OT Duration/Estimated Length of Stay: 3 weeks OT Treatment/Interventions: Balance/vestibular training;Cognitive remediation/compensation;DME/adaptive equipment instruction;Discharge planning;Functional mobility training;Neuromuscular re-education;Psychosocial support;Patient/family education;Self Care/advanced ADL retraining;UE/LE Strength  taining/ROM;Therapeutic Exercise;Therapeutic  Activities;UE/LE Coordination activities;Visual/perceptual remediation/compensation OT Self Feeding Anticipated Outcome(s): independent OT Basic Self-Care Anticipated Outcome(s): CGA overall OT Toileting Anticipated Outcome(s): CGA OT Bathroom Transfers Anticipated Outcome(s): CGA to toilet, min to tub/sh OT Recommendation Patient destination: Home Follow Up Recommendations: Home health OT Equipment Recommended: To be determined   OT Evaluation Precautions/Restrictions  Precautions Precautions: Sternal;Fall Restrictions Other Position/Activity Restrictions: sternal precautions  Pain Pain Assessment Pain Scale: Faces Pain Score: 0-No pain Faces Pain Scale: No hurt Home Living/Prior Functioning Home Living Family/patient expects to be discharged to:: Private residence Living Arrangements: Spouse/significant other Available Help at Discharge: Family, Available 24 hours/day Type of Home: House Home Access: Stairs to enter Technical brewer of Steps: 3 Home Layout: Two level, Able to live on main level with bedroom/bathroom Bathroom Shower/Tub: Chiropodist: Standard Additional Comments: pt lives in multilevel home; daughter Kathlee Nations) states pt would likely d/c to her home so he could live on one level and she can provide 24/7 assist as needed (home set-up of daughter's home; she works in Personal assistant and has flexible hours)  Lives With: Spouse Prior Function Level of Independence: Independent with basic ADLs, Independent with gait, Independent with transfers  Able to Take Stairs?: Yes Driving: Yes Vocation: Retired Surveyor, mining Baseline Vision/History: 1 Wears glasses (reading only) Ability to See in Adequate Light: 1 Impaired Patient Visual Report: Other (comment) (pt unable to express self) Vision Assessment?: Vision impaired- to be further tested in functional context Additional Comments: attempted visual assessment but unable to follow directions. Per  observation, pt able to track to L and did not demonstrate a R gaze preference. Perception  Perception: Impaired Inattention/Neglect: Does not attend to left visual field;Does not attend to left side of body (can attend with cues, does try to use L hand) Praxis Praxis: Impaired Praxis Impairment Details: Motor planning Cognition Cognition Overall Cognitive Status: Impaired/Different from baseline Arousal/Alertness: Awake/alert Orientation Level: Nonverbal/unable to assess Memory: Impaired Memory Impairment: Retrieval deficit Attention: Focused;Sustained Focused Attention: Impaired Focused Attention Impairment: Verbal basic;Functional basic Sustained Attention: Impaired Sustained Attention Impairment: Verbal basic;Functional basic Awareness: Impaired Awareness Impairment: Intellectual impairment Problem Solving: Impaired Problem Solving Impairment: Verbal basic;Functional basic Behaviors: Impulsive Safety/Judgment: Impaired Brief Interview for Mental Status (BIMS) Repetition of Three Words (First Attempt): 3 Temporal Orientation: Year: Nonsensical Temporal Orientation: Month: Nonsensical Temporal Orientation: Day: Nonsensical Recall: "Sock": No, could not recall Recall: "Blue": No, could not recall Recall: "Bed": No, could not recall BIMS Summary Score: 3 Sensation Sensation Light Touch: Impaired by gross assessment Hot/Cold: Not tested Proprioception: Impaired by gross assessment Stereognosis: Impaired by gross assessment Coordination Gross Motor Movements are Fluid and Coordinated: No Fine Motor Movements are Fluid and Coordinated: No Coordination and Movement Description: decreased motor control of LUE, limited grasp Motor  Motor Motor: Hemiplegia  Trunk/Postural Assessment  Postural Control Postural Control: Deficits on evaluation (posterior lean in standing, fair in sitting)  Balance Static Sitting Balance Static Sitting - Level of Assistance: 5: Stand by  assistance Dynamic Sitting Balance Dynamic Sitting - Level of Assistance: 4: Min assist Static Standing Balance Static Standing - Level of Assistance: 1: +2 Total assist Dynamic Standing Balance Dynamic Standing - Level of Assistance: Not tested (comment) Extremity/Trunk Assessment RUE Assessment RUE Assessment: Within Functional Limits LUE Assessment Passive Range of Motion (PROM) Comments: WFL Active Range of Motion (AROM) Comments: sh flexion to 120, finger flex and ext 25 % of AROM General Strength Comments: shoulder 3-/5, elbow flex and ext 3+/5, grasp 2-/5 LUE Body System:  Neuro Brunstrum levels for arm and hand: Arm;Hand Brunstrum level for arm: Stage III Synergy is performed voluntarily Brunstrum level for hand: Stage III Synergies performed voluntarily  Care Tool Care Tool Self Care Eating   Eating Assist Level: Moderate Assistance - Patient 50 - 74%    Oral Care    Oral Care Assist Level: Moderate Assistance - Patient 50 - 74%    Bathing   Body parts bathed by patient: Chest;Abdomen;Face;Left upper leg;Right upper leg;Left arm Body parts bathed by helper: Right arm;Front perineal area;Buttocks;Right lower leg;Left lower leg   Assist Level: Moderate Assistance - Patient 50 - 74%    Upper Body Dressing(including orthotics)   What is the patient wearing?: Pull over shirt   Assist Level: Maximal Assistance - Patient 25 - 49%    Lower Body Dressing (excluding footwear)   What is the patient wearing?: Incontinence brief;Pants Assist for lower body dressing: Maximal Assistance - Patient 25 - 49%    Putting on/Taking off footwear   What is the patient wearing?: Ted hose;Shoes Assist for footwear: Dependent - Patient 0%       Care Tool Toileting Toileting activity   Assist for toileting: 2 Helpers     Care Tool Bed Mobility Roll left and right activity   Roll left and right assist level: Minimal Assistance - Patient > 75%    Sit to lying activity         Lying to sitting on side of bed activity   Lying to sitting on side of bed assist level: the ability to move from lying on the back to sitting on the side of the bed with no back support.: Moderate Assistance - Patient 50 - 74%     Care Tool Transfers Sit to stand transfer   Sit to stand assist level: Moderate Assistance - Patient 50 - 74%    Chair/bed transfer   Chair/bed transfer assist level: Dependent - mechanical lift     Toilet transfer   Assist Level: Dependent - Patient 0% (stedy)     Care Tool Cognition  Expression of Ideas and Wants Expression of Ideas and Wants: 2. Frequent difficulty - frequently exhibits difficulty with expressing needs and ideas  Understanding Verbal and Non-Verbal Content Understanding Verbal and Non-Verbal Content: 2. Sometimes understands - understands only basic conversations or simple, direct phrases. Frequently requires cues to understand   Memory/Recall Ability Memory/Recall Ability : None of the above were recalled   Refer to Care Plan for Long Term Goals  SHORT TERM GOAL WEEK 1 OT Short Term Goal 1 (Week 1): Pt will demonstrate improved L side attention to wash LUE with min cues vs mod cues to attend to arm. OT Short Term Goal 2 (Week 1): Pt will demonstrate improved motor planning to don shirt with min A OT Short Term Goal 3 (Week 1): Pt will demonstrate improved postural control to hold static stand with min A or less. OT Short Term Goal 4 (Week 1): Pt will demonstrate improved L hand function to be able to wash under R arm and hold onto washcloth.  Recommendations for other services: None    Skilled Therapeutic Intervention ADL ADL Eating: Moderate assistance Grooming: Moderate assistance Upper Body Bathing: Minimal assistance;Moderate cueing Where Assessed-Upper Body Bathing: Edge of bed Lower Body Bathing: Moderate cueing;Maximal assistance Where Assessed-Lower Body Bathing: Edge of bed Upper Body Dressing: Moderate  assistance;Moderate cueing Where Assessed-Upper Body Dressing: Edge of bed Lower Body Dressing: Maximal assistance;Moderate cueing Where Assessed-Lower Body Dressing:  Edge of bed Toileting: Dependent Where Assessed-Toileting: Bedside Commode Toilet Transfer: Other (comment) Toilet Transfer Method: Other (comment) (stedy) Toilet Transfer Equipment: Bedside commode Mobility  Transfers Sit to Stand: Moderate Assistance - Patient 50-74% Stand to Sit: Moderate Assistance - Patient 50-74% Pt seen for initial evaluation and initiation of ADL training. Education on role of OT, ELOS, OT POC, discussed pt's goals with pt and his spouse.  Pt has global aphasia so his understanding is limited.  Pt able to follow some directions more easily with concrete tasks.  For ex, he was able to participate in MMT and follow cues for bed mobility.  But other tasks such as following directions for vision assessment pt unable to follow.  He did demonstrate L inattention and apraxia along with decrease LUE motor control which required him to need max A with self care.  Pt completed ADLs from EOB and used stedy to transfer to recliner.   OF NOTE, PT IS ON STERNAL PRECAUTIONS BUT WAS CONSTANTLY TRYING TO PUSH UP WITH HIS HANDS. HIS WIFE SAID HE HAS BEEN DOING THIS, HIS APHASIA MAKE ADHERENCE TO THESE PRECAUTIONS CHALLENGING. Pt in recliner with belt alarm on and wife in room with him.   Discharge Criteria: Patient will be discharged from OT if patient refuses treatment 3 consecutive times without medical reason, if treatment goals not met, if there is a change in medical status, if patient makes no progress towards goals or if patient is discharged from hospital.  The above assessment, treatment plan, treatment alternatives and goals were discussed and mutually agreed upon: by patient and by family  Shaniya Tashiro 10/05/2022, 1:14 PM

## 2022-10-05 NOTE — Plan of Care (Signed)
  Problem: RH Balance Goal: LTG Patient will maintain dynamic standing with ADLs (OT) Description: LTG:  Patient will maintain dynamic standing balance with assist during activities of daily living (OT)  Flowsheets (Taken 10/05/2022 1321) LTG: Pt will maintain dynamic standing balance during ADLs with: Contact Guard/Touching assist   Problem: Sit to Stand Goal: LTG:  Patient will perform sit to stand in prep for activites of daily living with assistance level (OT) Description: LTG:  Patient will perform sit to stand in prep for activites of daily living with assistance level (OT) Flowsheets (Taken 10/05/2022 1321) LTG: PT will perform sit to stand in prep for activites of daily living with assistance level: Contact Guard/Touching assist   Problem: RH Eating Goal: LTG Patient will perform eating w/assist, cues/equip (OT) Description: LTG: Patient will perform eating with assist, with/without cues using equipment (OT) Flowsheets (Taken 10/05/2022 1321) LTG: Pt will perform eating with assistance level of: Independent   Problem: RH Grooming Goal: LTG Patient will perform grooming w/assist,cues/equip (OT) Description: LTG: Patient will perform grooming with assist, with/without cues using equipment (OT) Flowsheets (Taken 10/05/2022 1321) LTG: Pt will perform grooming with assistance level of: Set up assist    Problem: RH Bathing Goal: LTG Patient will bathe all body parts with assist levels (OT) Description: LTG: Patient will bathe all body parts with assist levels (OT) Flowsheets (Taken 10/05/2022 1321) LTG: Pt will perform bathing with assistance level/cueing: Minimal Assistance - Patient > 75%   Problem: RH Dressing Goal: LTG Patient will perform upper body dressing (OT) Description: LTG Patient will perform upper body dressing with assist, with/without cues (OT). Flowsheets (Taken 10/05/2022 1321) LTG: Pt will perform upper body dressing with assistance level of: Supervision/Verbal  cueing Goal: LTG Patient will perform lower body dressing w/assist (OT) Description: LTG: Patient will perform lower body dressing with assist, with/without cues in positioning using equipment (OT) Flowsheets (Taken 10/05/2022 1321) LTG: Pt will perform lower body dressing with assistance level of: Contact Guard/Touching assist   Problem: RH Toileting Goal: LTG Patient will perform toileting task (3/3 steps) with assistance level (OT) Description: LTG: Patient will perform toileting task (3/3 steps) with assistance level (OT)  Flowsheets (Taken 10/05/2022 1321) LTG: Pt will perform toileting task (3/3 steps) with assistance level: Contact Guard/Touching assist   Problem: RH Vision Goal: RH LTG Vision Consulting civil engineer) Flowsheets (Taken 10/05/2022 1321) LTG: Vision Goals: Pt will attend to LUE with no cues needed.   Problem: RH Functional Use of Upper Extremity Goal: LTG Patient will use RT/LT upper extremity as a (OT) Description: LTG: Patient will use right/left upper extremity as a stabilizer/gross assist/diminished/nondominant/dominant level with assist, with/without cues during functional activity (OT) Flowsheets (Taken 10/05/2022 1321) LTG: Use of upper extremity in functional activities: LUE as gross assist level LTG: Pt will use upper extremity in functional activity with assistance level of: Supervision/Verbal cueing   Problem: RH Toilet Transfers Goal: LTG Patient will perform toilet transfers w/assist (OT) Description: LTG: Patient will perform toilet transfers with assist, with/without cues using equipment (OT) Flowsheets (Taken 10/05/2022 1321) LTG: Pt will perform toilet transfers with assistance level of: Contact Guard/Touching assist   Problem: RH Tub/Shower Transfers Goal: LTG Patient will perform tub/shower transfers w/assist (OT) Description: LTG: Patient will perform tub/shower transfers with assist, with/without cues using equipment (OT) Flowsheets (Taken 10/05/2022  1321) LTG: Pt will perform tub/shower stall transfers with assistance level of: Minimal Assistance - Patient > 75% LTG: Pt will perform tub/shower transfers from: Tub/shower combination

## 2022-10-06 DIAGNOSIS — I63521 Cerebral infarction due to unspecified occlusion or stenosis of right anterior cerebral artery: Secondary | ICD-10-CM | POA: Diagnosis not present

## 2022-10-06 MED ORDER — TAMSULOSIN HCL 0.4 MG PO CAPS
0.8000 mg | ORAL_CAPSULE | Freq: Every day | ORAL | Status: DC
  Administered 2022-10-07 – 2022-10-12 (×6): 0.8 mg via ORAL
  Filled 2022-10-06 (×7): qty 2

## 2022-10-06 MED ORDER — TAMSULOSIN HCL 0.4 MG PO CAPS
0.4000 mg | ORAL_CAPSULE | Freq: Once | ORAL | Status: AC
  Administered 2022-10-06: 0.4 mg via ORAL

## 2022-10-06 MED ORDER — SENNOSIDES-DOCUSATE SODIUM 8.6-50 MG PO TABS
1.0000 | ORAL_TABLET | Freq: Every day | ORAL | Status: DC
  Administered 2022-10-06 – 2022-10-19 (×14): 1 via ORAL
  Filled 2022-10-06 (×14): qty 1

## 2022-10-06 NOTE — Plan of Care (Signed)
  Problem: RH BLADDER ELIMINATION Goal: RH STG MANAGE BLADDER WITH ASSISTANCE Description: STG Manage Bladder With min Assistance Outcome: Not Progressing; in and out cath    

## 2022-10-06 NOTE — Progress Notes (Addendum)
PROGRESS NOTE   Subjective/Complaints:  No acute complaints. No events overnight.   Patient endorses no issues this AM. Unsure when last BM was; per nursing, wife reported BM yesterday.  PVRs remain high, discussed with nursing increasing frequency bladder scans to Q4H.   ROS: Limited d/t aphasia  Objective:   No results found. Recent Labs    10/04/22 0052 10/05/22 0618  WBC 16.4* 13.9*  HGB 8.2* 8.3*  HCT 26.0* 25.9*  PLT 312 310    Recent Labs    10/04/22 0052 10/05/22 0618  NA 138 137  K 4.0 3.9  CL 107 107  CO2 22 23  GLUCOSE 121* 120*  BUN 28* 18  CREATININE 1.37* 1.08  CALCIUM 8.2* 8.1*     Intake/Output Summary (Last 24 hours) at 10/06/2022 3875 Last data filed at 10/06/2022 6433 Gross per 24 hour  Intake 787 ml  Output 2500 ml  Net -1713 ml         Physical Exam: Vital Signs Blood pressure 123/68, pulse 71, temperature 98.5 F (36.9 C), temperature source Oral, resp. rate 20, weight 80 kg, SpO2 95 %. Physical Exam  Constitutional:      General: He is not in acute distress. AT/.     HENT:     Head: Normocephalic and atraumatic. + L facial droop- mild   Eyes:     Pupils: PERRLA     Comments: + R gaze preference Cardiovascular:     Rate and Rhythm: Normal rate and regular rhythm. +Murmur  Pulmonary:     Effort: Pulmonary effort is normal. No respiratory distress.     Breath sounds: Normal breath sounds. No stridor. No wheezing or rales.  Abdominal:     General: Bowel sounds are normal. There is no distension.     Palpations: Abdomen is soft.     Tenderness: There is no abdominal tenderness.   Musculoskeletal:  Antigravity and against resistance all 4 extremities; L>R weakness.   Skin:    General: Skin is warm and dry. Mild ecchymosis on nose - stable  Neurological:     Mental Status: Follows 0/3 verbal commands, 1/3 visual cues. Oriented to self, place as "[place]" but  cannot name hospital with cues. + Echololalia + perseveration. Can answer 1/2 yes/no quesstions.   CN exam: + L facial droop. + L face sensory deficit.  Psychiatric:     Comments: Appropriate mood and affect.        Assessment/Plan: 1. Functional deficits which require 3+ hours per day of interdisciplinary therapy in a comprehensive inpatient rehab setting. Physiatrist is providing close team supervision and 24 hour management of active medical problems listed below. Physiatrist and rehab team continue to assess barriers to discharge/monitor patient progress toward functional and medical goals  Care Tool:  Bathing    Body parts bathed by patient: Chest, Abdomen, Face, Left upper leg, Right upper leg, Left arm   Body parts bathed by helper: Right arm, Front perineal area, Buttocks, Right lower leg, Left lower leg     Bathing assist Assist Level: Moderate Assistance - Patient 50 - 74%     Upper Body Dressing/Undressing Upper body dressing   What  is the patient wearing?: Pull over shirt    Upper body assist Assist Level: Maximal Assistance - Patient 25 - 49%    Lower Body Dressing/Undressing Lower body dressing      What is the patient wearing?: Incontinence brief, Pants     Lower body assist Assist for lower body dressing: Maximal Assistance - Patient 25 - 49%     Toileting Toileting    Toileting assist Assist for toileting: 2 Helpers     Transfers Chair/bed transfer  Transfers assist     Chair/bed transfer assist level: Maximal Assistance - Patient 25 - 49%     Locomotion Ambulation   Ambulation assist   Ambulation activity did not occur: Safety/medical concerns (dizziness and mild diaphoresis on standing)          Walk 10 feet activity   Assist  Walk 10 feet activity did not occur: Safety/medical concerns        Walk 50 feet activity   Assist Walk 50 feet with 2 turns activity did not occur: Safety/medical concerns         Walk  150 feet activity   Assist Walk 150 feet activity did not occur: Safety/medical concerns         Walk 10 feet on uneven surface  activity   Assist Walk 10 feet on uneven surfaces activity did not occur: Safety/medical concerns         Wheelchair     Assist Is the patient using a wheelchair?: Yes Type of Wheelchair: Manual    Wheelchair assist level: Dependent - Patient 0% Max wheelchair distance: 35 ft    Wheelchair 50 feet with 2 turns activity    Assist    Wheelchair 50 feet with 2 turns activity did not occur: Safety/medical concerns       Wheelchair 150 feet activity     Assist  Wheelchair 150 feet activity did not occur: Safety/medical concerns       Blood pressure 123/68, pulse 71, temperature 98.5 F (36.9 C), temperature source Oral, resp. rate 20, weight 80 kg, SpO2 95 %.    Medical Problem List and Plan: 1. 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  Antithrombotics: -DVT/anticoagulation: If atrial fibrillation recurs, may need to consider AC             -antiplatelet therapy: aspirin 81 mg daily             SCDs and TEDs; consider starting Lovenox 3. Pain Management: Tylenol as needed 4. Mood/Behavior/Sleep: LCSW to evaluate and provide emotional support             -antipsychotic agents: n/a 5. Neuropsych/cognition: This patient is not capable of making decisions on his own behalf. 6. Skin/Wound Care: Routine skin care checks  7. Fluids/Electrolytes/Nutrition: Strict Is and Os and follow-up chemistries             -daily weight             -continue dysphagia 2 diet/thins; SLP eval             -continue feeding supplement  8: Heart failure with pEF (see meds below)             -  monitor Mg and K              -daily weight (weight range 65kg)             -follow-up with Dr. Haroldine Laws              - Mildly increased 11/13; uptrending 11/23, however no overt s/s hypervolemia; monitor, BMP in AM  Filed Weights   10/05/22 0451 10/06/22 0501  Weight: 77.9 kg 80 kg     9: Leukocytosis/leukemoid reaction: Zosyn to finish 11/21 -starting Augmentin BID for 10 days             -follow-up CBC - downtrending   10: Post-op atrial fibrillation:  -continue amiodarone 200 mg BID for 7 days then 200 mg daily             -if recurs, consider AC  11: Left renal artery stenosis: follow-up as outpatient 12: Acute blood loss anemia: follow-up CBC  - stable 13: Hyperlipidemia: continue Lipitor 40 mg daily 14: GERD/HH: no meds (Pepcid discontinued) 15: Hypertension: montior TID and prn             -continue Lopressor 12.5 mg BID             -restart losartan 50 mg daily, Spironolactone 12.5 mg as BP allows 16: Acute urinary retention:             -has foley cath - Dced on admission, sarting PVRs Q6H for voiding trial             -continue Flomax 0.4 mg daily -> 0.8 mg 11/23            - 11/23: All bladder scans extremely high 650-900; increase Flomax to 0.8 mg daily, increase bladder scans to Q4H. If continued fail today will replace foley for weekend.   17: Acute kidney injury: serum creatinine improving; follow-up BMP  - stable  18. Agitation/confusion/Negative mood- per family- didn't see myself- might need Seroquel vs Depakote to help with calming pt down - No issues overnight  19. Constipation           - LBM 11/22 per wife           - Added Sennakot-S 1 tab QHS  LOS: 2 days A FACE TO FACE EVALUATION WAS PERFORMED  Gertie Gowda 10/06/2022, 9:17 AM

## 2022-10-07 DIAGNOSIS — I63521 Cerebral infarction due to unspecified occlusion or stenosis of right anterior cerebral artery: Secondary | ICD-10-CM | POA: Diagnosis not present

## 2022-10-07 LAB — BASIC METABOLIC PANEL
Anion gap: 14 (ref 5–15)
BUN: 18 mg/dL (ref 8–23)
CO2: 24 mmol/L (ref 22–32)
Calcium: 8.7 mg/dL — ABNORMAL LOW (ref 8.9–10.3)
Chloride: 103 mmol/L (ref 98–111)
Creatinine, Ser: 1.22 mg/dL (ref 0.61–1.24)
GFR, Estimated: >60 mL/min (ref >60)
Glucose, Bld: 113 mg/dL — ABNORMAL HIGH (ref 70–99)
Potassium: 3.8 mmol/L (ref 3.5–5.1)
Sodium: 141 mmol/L (ref 135–145)

## 2022-10-07 LAB — CBC
HCT: 27.6 % — ABNORMAL LOW (ref 39.0–52.0)
Hemoglobin: 8.7 g/dL — ABNORMAL LOW (ref 13.0–17.0)
MCH: 28 pg (ref 26.0–34.0)
MCHC: 31.5 g/dL (ref 30.0–36.0)
MCV: 88.7 fL (ref 80.0–100.0)
Platelets: 378 10*3/uL (ref 150–400)
RBC: 3.11 MIL/uL — ABNORMAL LOW (ref 4.22–5.81)
RDW: 14.8 % (ref 11.5–15.5)
WBC: 15.1 10*3/uL — ABNORMAL HIGH (ref 4.0–10.5)
nRBC: 0 % (ref 0.0–0.2)

## 2022-10-07 NOTE — Progress Notes (Signed)
Physical Therapy Session Note  Patient Details  Name: James Estrada MRN: [id number] Date of Birth: [birth year]  Today's Date: 10/07/2022 PT Individual Time: 1001-1017 and PT Individual Time Calculation (min): 16 min  and 17 min Today's Date: 10/07/2022 PT Missed Time: 27 Minutes Missed Time Reason: Nursing care;Toileting (Pt requiring bladder scan and in/out catheterization)  Short Term Goals: Week 1:  PT Short Term Goal 1 (Week 1): Pt will perform bed mobility with improved initiation, understanding of vc and overall MinA. PT Short Term Goal 2 (Week 1): Pt will perform sit<>stand with overall Min/ModA while adhering to sternal precautions. PT Short Term Goal 3 (Week 1): Pt will perform stand pivot transfers with overall ModA requring <50% assist with manual weight shift facilitation. PT Short Term Goal 4 (Week 1): Pt will initiate gait training. PT Short Term Goal 5 (Week 1): Pt will initiate step training.  Skilled Therapeutic Interventions/Progress Updates:  Patient seated upright in w/c on entrance to room. Wife and dtr present. Wife relating that pt requires "procedure" and therapy will have to wait. RN relates pt's difficulty with voiding bladder voluntarily and will need to have bladder scanned. Patient alert and agreeable to PT session.   Patient with no pain complaint at start of session.  Therapeutic Activity: Pt performs rise to stand with MinA for and then requires ModA for balance/ ModA with need to physically facilitate turn at hips and lateral weight shift to initiate correct stepping pattern to pivot to bed. Then pt performed sit --> supine with CGA following only verbal instructions. Requires ModA +2 to move toward [facility] with minimal assist from BLE. VC/ tc required for positioning technique.  Following in/ out catheterization, pt seated upright in bed in long sitting position. With vc only, is able to reach seated position on EOB with CGA. Recliner placed away from bed  and recommended for pt to sit in recliner for improved positioning for lunch meal. Pt performs ambulatory transfer from bed to recliner covering 6 feet with MinA for lateral weight shift facilitation during face-to-face assist. Continued MinA for weight shifting in order to initiate turn for sit at recliner. ModA for controlled descent to sit.   Pt's bed noted to have hydraulic failure at foot of bed and requested new bed from nursing secretary.   Patient seated upright in recliner at end of session with brakes locked, no alarm set as pt's dtr and wife present, and all needs within reach.   Therapy Documentation Precautions:  Precautions Precautions: Sternal, Fall Precaution Comments: L inattention Restrictions Weight Bearing Restrictions: No Other Position/Activity Restrictions: sternal precautions - keep hands close to body with mobility General: PT Amount of Missed Time (min): 27 Minutes PT Missed Treatment Reason: Nursing care;Toileting (Pt requiring bladder scan and in/out catheterization) Vital Signs:   Pain:  No pain related this session.   Therapy/Group: Individual Therapy  Loel Dubonnet PT, DPT, CSRS 10/07/2022, 10:14 AM

## 2022-10-07 NOTE — IPOC Note (Signed)
Overall Plan of Care [facility]) Patient Details Name: James Estrada MRN: [id number] DOB: [birth year]  Admitting Diagnosis: CVA (cerebral vascular accident) [facility])  Hospital Problems: Principal Problem:   CVA (cerebral vascular accident) [facility])     Functional Problem List: Nursing Bladder, Bowel, Endurance, Medication Management, Pain, Safety  PT Balance, Behavior, Endurance, Motor, Perception, Safety, Sensory, Skin Integrity  OT Balance, Cognition, Endurance, Motor, Perception, Safety, Vision  SLP Behavior, Cognition, Linguistic, Motor  TR         Basic ADL's: OT Eating, Grooming, Bathing, Dressing, Toileting     Advanced  ADL's: OT       Transfers: PT Bed Mobility, Bed to Chair, Car, Manufacturing systems engineer, Metallurgist: PT Ambulation, Emergency planning/management officer, Stairs     Additional Impairments: OT Fuctional Use of Upper Extremity  SLP Swallowing, Communication, Social Cognition comprehension, expression Problem Solving, Social Interaction, Memory, Attention, Awareness  TR      Anticipated Outcomes Item Anticipated Outcome  Self Feeding independent  Swallowing  Sup A   Basic self-care  CGA overall  Toileting  CGA   Bathroom Transfers CGA to toilet, min to tub/sh  Bowel/Bladder  manage bowel w mod I assist and bladder with medication/min assist  Transfers     Locomotion     Communication  Min-Mod A  Cognition  N/A  Pain  < 4 with prns  Safety/Judgment  manage w cues   Therapy Plan: PT Intensity: Minimum of 1-2 x/day ,45 to 90 minutes PT Frequency: 5 out of 7 days PT Duration Estimated Length of Stay: 14-18 days OT Intensity: Minimum of 1-2 x/day, 45 to 90 minutes OT Frequency: 5 out of 7 days OT Duration/Estimated Length of Stay: 3 weeks SLP Intensity: Minumum of 1-2 x/day, 30 to 90 minutes SLP Frequency: 3 to 5 out of 7 days SLP Duration/Estimated Length of Stay: ~3 weeks   Team Interventions: Nursing Interventions Bladder Management,  Patient/Family Education, Bowel Management, Pain Management, Medication Management, Disease Management/Prevention, Discharge Planning  PT interventions Ambulation/gait training, Community reintegration, DME/adaptive equipment instruction, Neuromuscular re-education, Psychosocial support, Stair training, UE/LE Strength taining/ROM, Wheelchair propulsion/positioning, UE/LE Coordination activities, Therapeutic Activities, Skin care/wound management, Pain management, Discharge planning, Training and development officer, Cognitive remediation/compensation, Disease management/prevention, Patient/family education, Functional mobility training, Splinting/orthotics, Therapeutic Exercise, Visual/perceptual remediation/compensation  OT Interventions Balance/vestibular training, Cognitive remediation/compensation, DME/adaptive equipment instruction, Discharge planning, Functional mobility training, Neuromuscular re-education, Psychosocial support, Patient/family education, Self Care/advanced ADL retraining, UE/LE Strength taining/ROM, Therapeutic Exercise, Therapeutic Activities, UE/LE Coordination activities, Visual/perceptual remediation/compensation  SLP Interventions Functional tasks, Patient/family education, Speech/Language facilitation, Dysphagia/aspiration precaution training  TR Interventions    SW/CM Interventions Discharge Planning, Psychosocial Support, Patient/Family Education, Disease Management/Prevention   Barriers to Discharge MD  Medical stability, Home enviroment access/loayout, Incontinence, and Lack of/limited family support  Nursing Home environment access/layout multi level main B+B 3 ste w spouse; dtr can assist prn  PT Inaccessible home environment, Home environment access/layout, Incontinence, Wound Care, Insurance for SNF coverage, Weight, Weight bearing restrictions, Behavior    OT      SLP      SW Decreased caregiver support, Lack of/limited family support, Home environment  access/layout Patient lives in multilevel home. Patient discharging to daughters home.   Team Discharge Planning: Destination: PT-Home ,OT- Home , SLP-Home Projected Follow-up: PT-Home health PT, OT-  Home health OT, SLP-Home Health SLP, Outpatient SLP, 24 hour supervision/assistance Projected Equipment Needs: PT-To be determined, OT- To be determined, SLP-None recommended by SLP Equipment Details: PT- , OT-  Patient/family involved in discharge planning: PT- Patient, Family member/caregiver,  OT-Family member/caregiver, SLP-Patient, Family member/caregiver  MD ELOS: 12-14 days Medical Rehab Prognosis:  Good Assessment: The patient has been admitted for CIR therapies with the diagnosis of right frontal/basal ganglia/cerebellar strokes. The team will be addressing functional mobility, strength, stamina, balance, safety, adaptive techniques and equipment, self-care, bowel and bladder mgt, patient and caregiver education, and communication. Goals have been set at [facility] for PT/OT, and Min A/Mod A for SLP. Anticipated discharge destination is home.    See Team Conference Notes for weekly updates to the plan of care     Angelina Sheriff, DO 10/07/2022

## 2022-10-07 NOTE — Plan of Care (Signed)
  Problem: RH Balance Goal: LTG Patient will maintain dynamic standing balance (PT) Description: LTG:  Patient will maintain dynamic standing balance with assistance during mobility activities (PT) Flowsheets (Taken 10/05/2022 1631) LTG: Pt will maintain dynamic standing balance during mobility activities with:: Supervision/Verbal cueing   Problem: Sit to Stand Goal: LTG:  Patient will perform sit to stand with assistance level (PT) Description: LTG:  Patient will perform sit to stand with assistance level (PT) Flowsheets (Taken 10/05/2022 1631) LTG: PT will perform sit to stand in preparation for functional mobility with assistance level: Supervision/Verbal cueing   Problem: RH Bed to Chair Transfers Goal: LTG Patient will perform bed/chair transfers w/assist (PT) Description: LTG: Patient will perform bed to chair transfers with assistance (PT). Flowsheets (Taken 10/05/2022 1631) LTG: Pt will perform Bed to Chair Transfers with assistance level: Supervision/Verbal cueing   Problem: RH Car Transfers Goal: LTG Patient will perform car transfers with assist (PT) Description: LTG: Patient will perform car transfers with assistance (PT). Flowsheets (Taken 10/05/2022 1631) LTG: Pt will perform car transfers with assist:: Contact Guard/Touching assist   Problem: RH Furniture Transfers Goal: LTG Patient will perform furniture transfers w/assist (OT/PT) Description: LTG: Patient will perform furniture transfers  with assistance (OT/PT). Flowsheets (Taken 10/05/2022 1631) LTG: Pt will perform furniture transfers with assist:: Contact Guard/Touching assist   Problem: RH Ambulation Goal: LTG Patient will ambulate in controlled environment (PT) Description: LTG: Patient will ambulate in a controlled environment, # of feet with assistance (PT). Flowsheets (Taken 10/05/2022 1631) LTG: Pt will ambulate in controlled environ  assist needed:: Supervision/Verbal cueing LTG: Ambulation distance in  controlled environment: 100 ft using LRAD Goal: LTG Patient will ambulate in home environment (PT) Description: LTG: Patient will ambulate in home environment, # of feet with assistance (PT). Flowsheets (Taken 10/05/2022 1631) LTG: Pt will ambulate in home environ  assist needed:: Contact Guard/Touching assist LTG: Ambulation distance in home environment: at least 50 ft using LRAD   Problem: RH Stairs Goal: LTG Patient will ambulate up and down stairs w/assist (PT) Description: LTG: Patient will ambulate up and down # of stairs with assistance (PT) Flowsheets (Taken 10/05/2022 1631) LTG: Pt will ambulate up/down stairs assist needed:: Contact Guard/Touching assist LTG: Pt will  ambulate up and down number of stairs: at least 3 steps with HR setup as per home environment

## 2022-10-07 NOTE — Progress Notes (Signed)
Speech Language Pathology Daily Session Note  Patient Details  Name: James Estrada MRN: [id number] Date of Birth: Jun 04, 1941  Today's Date: 10/07/2022 SLP Individual Time: 0900-1000 SLP Individual Time Calculation (min): 60 min  Short Term Goals: Week 1: SLP Short Term Goal 1 (Week 1): Pt will answer basic and semi-complex yes/no questions with 90% accuracy given Min A. SLP Short Term Goal 2 (Week 1): Pt will follow one-step commands on 80% of trials given Mod A multinodal cues. SLP Short Term Goal 3 (Week 1): Pt will match object or picture to word with 90% accuracy given Min A. SLP Short Term Goal 4 (Week 1): Pt will participate in responsive naming tasks with 90% given Mod A multimodal cues. SLP Short Term Goal 5 (Week 1): Pt will effectively communicate wants, needs, and ideas for ~80% of session with Mod A. SLP Short Term Goal 6 (Week 1): Pt will participate in therapeutic trials of Dysphagia 3 textures and thin liquids with minimal s/sx concerning for aspiration with Min A.  Skilled Therapeutic Interventions: Skilled ST treatment focused on communication goals. Pt was greeted in wheelchair on arrival and accompanied by spouse, Kathie Rhodes. Daughter, Lanora Manis, present at end of session and receptive to education regarding communication deficits. SLP facilitated comprehension of biographical yes/no questions with min A verbal cues to achieve 76% accuracy. SLP facilitated word finding with common objects with 50% accuracy; pt was known to perseverate often on previous stimuli without awareness. Pt was able to verbalize functional needs with mod A verbal cues and extended time for processing and thought organization. Pt exhibited non-fluent speech with frequent halting and circumlocution. SLP facilitated following 1-step commands with max-to-total A multimodal cues to achieve 20% accuracy following extensive repetition. Pt was known to verbally repeat SLP's instructions accurately however exhibited  substantial difficulty executing command likely d/t motor planning deficits. Suspect pt may exhibit improvement within natural contexts such as during ADLs, however anticipate pt will continue to have significant difficulty with this. Patient was left in wheelchair with alarm activated and immediate needs within reach at end of session. Continue per current plan of care.      Pain  None/denied  Therapy/Group: Individual Therapy  Indica Marcott T Verdell Kincannon 10/07/2022, 9:08 AM

## 2022-10-07 NOTE — Progress Notes (Signed)
Physical Therapy Session Note  Patient Details  Name: James Estrada MRN: [id number] Date of Birth: 10-10-1941  Today's Date: 10/07/2022 PT Individual Time: 1200-1253 PT Individual Time Calculation (min): 53 min   Short Term Goals: Week 1:  PT Short Term Goal 1 (Week 1): Pt will perform bed mobility with improved initiation, understanding of vc and overall MinA. PT Short Term Goal 2 (Week 1): Pt will perform sit<>stand with overall Min/ModA while adhering to sternal precautions. PT Short Term Goal 3 (Week 1): Pt will perform stand pivot transfers with overall ModA requring <50% assist with manual weight shift facilitation. PT Short Term Goal 4 (Week 1): Pt will initiate gait training. PT Short Term Goal 5 (Week 1): Pt will initiate step training.   Skilled Therapeutic Interventions/Progress Updates:      Pt received sitting in recliner and agreeable to PT. Pt performed sit<>stand transfer with mod assist and RUE supported on RW. Ambulatory transfer to [facility] over toilet with mod assist and WC to follow for safety; heavy posterior bias in standing. Pt able to void bowel sitting on toilet. Pt reports that he was able to urinate, but no evidence of urine in toilet. Ambulatory transfer to and from toilet for second attempt to urinate standing at toilet without success. Mod assist for all transfers to and from [facility] with UE supported on RW. PT required to perform all clothing management and pericare due to apraxia. Gait training with RW in rehab gym with RW and mod assist for continual advancement x 67f. PT assessed BP sitting 129/72. HR 76. And in standing 143/120 HR 90 with increased labor in breathing (question accuracy as pt constantly flexing and relaxing LUE on RW.) Sitting 112/56. HR 72.   Pt returned to room and performed ambulatory transfer to bed with **RW and mod assists for safety as listed above. Sit>supine completed with min assist for safety and BLE management, and left supine in bed  with call bell in reach and all needs met.       Therapy Documentation Precautions:  Precautions Precautions: Sternal, Fall Precaution Comments: L inattention Restrictions Weight Bearing Restrictions: No Other Position/Activity Restrictions: sternal precautions - keep hands close to body with mobility General:  Pain: Pain Assessment Pain Scale: 0-10 Pain Score: 0-No pain    Therapy/Group: Individual Therapy  ALorie Phenix11/24/2023, 4:33 PM

## 2022-10-07 NOTE — Progress Notes (Signed)
Occupational Therapy Session Note  Patient Details  Name: James Estrada MRN: [id number] Date of Birth: Mar 29, 1941  Today's Date: 10/07/2022 OT Individual Time: 2683-4196 OT Individual Time Calculation (min): 59 min    Short Term Goals: Week 1:  OT Short Term Goal 1 (Week 1): Pt will demonstrate improved L side attention to wash LUE with min cues vs mod cues to attend to arm. OT Short Term Goal 2 (Week 1): Pt will demonstrate improved motor planning to don shirt with min A OT Short Term Goal 3 (Week 1): Pt will demonstrate improved postural control to hold static stand with min A or less. OT Short Term Goal 4 (Week 1): Pt will demonstrate improved L hand function to be able to wash under R arm and hold onto washcloth.  Skilled Therapeutic Interventions/Progress Updates:  Pt greeted supine in bed, pt agreeable to OT intervention. Session focus on BADL reeducation, functional mobility, dynamic standing balance and decreasing overall caregiver burden.  Of note pt unable to follow sternal precautions despite MAX multimodal cues.      Pt completed supine>sit with CGA with MAX multimodal cues to sequence steps. Pt completed stand pivot to w/c to R side with MOD A for balance. Pt transported to sink where pt completed grooming tasks such as brushing hair and oral care. Pt noted to need   cues to attend to L side of head while brushing hair d/t L inattention. Pt also noted to begin brushing teeth with no water or paste donned. Visually demo'd applying paste and turning on water with no carryover of needing to complete these tasks first. Pt needed MAX A for this task and cues needed to terminate task and spit out water/paste.   Pt able to don shoes this session with set- up assist but needed assist to tie shoes. Pt did initiate crossing laces but unable to motor plan task further. Pt transported to day room in w/c where pt completed ~ 32ft of functional ambulation with HHA on L side with MODA, pt noted  to take lateral steps vs forward gait pattern. Worked on bilateral integration through familiar tasks with pt instructed to sit>stand from EOM with MIN A. Pt instructed to use BUEs to fold wash cloths however noted to only use RUE despite mAX cues. Pt then reports dizziness needing to sit down. BP assessed:    BP 121/91 ( 103) HR 87   Pt completed stand pivot back to w/c to R side with MODA.  Ended session with pt seated in w/c  with all needs within reach and wife present.       Therapy Documentation Precautions:  Precautions Precautions: Sternal, Fall Precaution Comments: L inattention Restrictions Weight Bearing Restrictions: No Other Position/Activity Restrictions: sternal precautions - keep hands close to body with mobility  Pain: Nopain    Therapy/Group: Individual Therapy  Pollyann Glen [facility] 10/07/2022, 12:24 PM

## 2022-10-07 NOTE — Progress Notes (Signed)
PROGRESS NOTE   Subjective/Complaints:  Overnight, bladder scans to Q4H consistently showing retention with very high output on straight cathing. Per wife, the Q4H ISC has been a great source of stress and anxiety for the patient. He is unable to endorse whether he feels the urge to urinate.   ROS: Limited d/t aphasia  Objective:   No results found. Recent Labs    10/05/22 0618 10/07/22 0448  WBC 13.9* 15.1*  HGB 8.3* 8.7*  HCT 25.9* 27.6*  PLT 310 378    Recent Labs    10/05/22 0618 10/07/22 0448  NA 137 141  K 3.9 3.8  CL 107 103  CO2 23 24  GLUCOSE 120* 113*  BUN 18 18  CREATININE 1.08 1.22  CALCIUM 8.1* 8.7*     Intake/Output Summary (Last 24 hours) at 10/07/2022 0857 Last data filed at 10/07/2022 0700 Gross per 24 hour  Intake 532 ml  Output 3500 ml  Net -2968 ml         Physical Exam: Vital Signs Blood pressure 117/60, pulse 72, temperature 98.7 F (37.1 C), temperature source Oral, resp. rate 15, weight 82.2 kg, SpO2 94 %. Physical Exam  Constitutional:      General: He is not in acute distress. AT/[place].     HENT:     Head: Normocephalic and atraumatic. + L facial droop- mild   Eyes:     Pupils: PERRLA     Comments: + R gaze preference Cardiovascular:     Rate and Rhythm: Normal rate and regular rhythm. +Murmur  Pulmonary:     Effort: Pulmonary effort is normal. No respiratory distress.     Breath sounds: Normal breath sounds. No stridor. No wheezing or rales.  Abdominal:     General: Bowel sounds are normal. There is no distension.     Palpations: Abdomen is soft.     Tenderness: There is no abdominal tenderness.   Musculoskeletal:  Antigravity and against resistance all 4 extremities; L>R weakness.   Skin:    General: Skin is warm and dry. Mild ecchymosis on nose - stable  Neurological:     Mental Status:  Oriented to self, place as "[place]" , not time with visual or  verbal cues.   Language: Fluent but with significant receptive and expressive aphasia. + Echololalia + perseveration. Cannot answer Y/N questions appropriately.   CN exam: + L facial droop. + L face sensory deficit.  Psychiatric:     Comments: Appropriate mood and affect.        Assessment/Plan: 1. Functional deficits which require 3+ hours per day of interdisciplinary therapy in a comprehensive inpatient rehab setting. Physiatrist is providing close team supervision and 24 hour management of active medical problems listed below. Physiatrist and rehab team continue to assess barriers to discharge/monitor patient progress toward functional and medical goals  Care Tool:  Bathing    Body parts bathed by patient: Chest, Abdomen, Face, Left upper leg, Right upper leg, Left arm   Body parts bathed by helper: Right arm, Front perineal area, Buttocks, Right lower leg, Left lower leg     Bathing assist Assist Level: Moderate Assistance - Patient 50 - 74%  Upper Body Dressing/Undressing Upper body dressing   What is the patient wearing?: Pull over shirt    Upper body assist Assist Level: Maximal Assistance - Patient 25 - 49%    Lower Body Dressing/Undressing Lower body dressing      What is the patient wearing?: Incontinence brief, Pants     Lower body assist Assist for lower body dressing: Maximal Assistance - Patient 25 - 49%     Toileting Toileting    Toileting assist Assist for toileting: 2 Helpers     Transfers Chair/bed transfer  Transfers assist  Chair/bed transfer activity did not occur: Safety/medical concerns  Chair/bed transfer assist level: Maximal Assistance - Patient 25 - 49%     Locomotion Ambulation   Ambulation assist   Ambulation activity did not occur: Safety/medical concerns (dizziness and mild diaphoresis on standing)          Walk 10 feet activity   Assist  Walk 10 feet activity did not occur: Safety/medical concerns         Walk 50 feet activity   Assist Walk 50 feet with 2 turns activity did not occur: Safety/medical concerns         Walk 150 feet activity   Assist Walk 150 feet activity did not occur: Safety/medical concerns         Walk 10 feet on uneven surface  activity   Assist Walk 10 feet on uneven surfaces activity did not occur: Safety/medical concerns         Wheelchair     Assist Is the patient using a wheelchair?: Yes Type of Wheelchair: Manual    Wheelchair assist level: Dependent - Patient 0% Max wheelchair distance: 35 ft    Wheelchair 50 feet with 2 turns activity    Assist    Wheelchair 50 feet with 2 turns activity did not occur: Safety/medical concerns       Wheelchair 150 feet activity     Assist  Wheelchair 150 feet activity did not occur: Safety/medical concerns       Blood pressure 117/60, pulse 72, temperature 98.7 F (37.1 C), temperature source Oral, resp. rate 15, weight 82.2 kg, SpO2 94 %.    Medical Problem List and Plan: 1. 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  Antithrombotics: -DVT/anticoagulation: If atrial fibrillation recurs, may need to consider AC             -antiplatelet therapy: aspirin 81 mg daily             SCDs and TEDs; consider starting Lovenox 3. Pain Management: Tylenol as needed 4. Mood/Behavior/Sleep: LCSW to evaluate and provide emotional support             -antipsychotic agents: n/a 5. Neuropsych/cognition: This patient is not capable of making decisions on his own behalf. 6. Skin/Wound Care: Routine skin care checks  7. Fluids/Electrolytes/Nutrition: Strict Is and Os and follow-up chemistries             -daily weight             -continue dysphagia 2  diet/thins; SLP eval             -  continue feeding supplement  8: Heart failure with pEF (see meds below)             -monitor Mg and K             -daily weight (weight range 65kg)             -follow-up with Dr. Haroldine Laws              - uptrending, appearance euvolemic, labs with mild Cr increase but otherwise stable.   Filed Weights   10/05/22 0451 10/06/22 0501 10/07/22 0500  Weight: 77.9 kg 80 kg 82.2 kg     9: Leukocytosis/leukemoid reaction: Zosyn to finish 11/21 -starting Augmentin BID for 10 days             -follow-up CBC - stable 14-15  10: Post-op atrial fibrillation:  -continue amiodarone 200 mg BID for 7 days then 200 mg daily             -if recurs, consider AC  11: Left renal artery stenosis: follow-up as outpatient 12: Acute blood loss anemia: follow-up CBC  - stable 13: Hyperlipidemia: continue Lipitor 40 mg daily 14: GERD/HH: no meds (Pepcid discontinued) 15: Hypertension: montior TID and prn             -continue Lopressor 12.5 mg BID             -restart losartan 50 mg daily, Spironolactone 12.5 mg as BP allows 16: Acute urinary retention:             -has foley cath - Dced on admission, sarting PVRs Q6H for voiding trial             -continue Flomax 0.4 mg daily -> 0.8 mg 11/23            - 11/23: All bladder scans extremely high 650-900; increase Flomax to 0.8 mg daily, increase bladder scans to Q4H. If continued fail today will replace foley for weekend.             - 11/24: Bladder scans/ISC remain extremely high ([phone] Q4H); replacing catheter today, remain on Flomax pending another trial next week.   17: Acute kidney injury: serum creatinine improving; follow-up BMP  - stable  18. Agitation/confusion/Negative mood- per family- didn't see myself- might need Seroquel vs Depakote to help with calming pt down - no issues - No issues overnight  19. Constipation           - LBM 11/22             - Added Sennakot-S 1 tab QHS  LOS: 3 days A FACE  TO FACE EVALUATION WAS PERFORMED  Gertie Gowda 10/07/2022, 8:57 AM

## 2022-10-08 DIAGNOSIS — I63521 Cerebral infarction due to unspecified occlusion or stenosis of right anterior cerebral artery: Secondary | ICD-10-CM | POA: Diagnosis not present

## 2022-10-08 MED ORDER — CHLORHEXIDINE GLUCONATE CLOTH 2 % EX PADS
6.0000 | MEDICATED_PAD | Freq: Every day | CUTANEOUS | Status: DC
  Administered 2022-10-08 – 2022-10-18 (×11): 6 via TOPICAL

## 2022-10-08 NOTE — Progress Notes (Signed)
Physical Therapy Session Note  Patient Details  Name: James Estrada MRN: [id number] Date of Birth: [birth year]  Today's Date: 10/08/2022 PT Individual Time: 0803-0900 PT Individual Time Calculation (min): 57 min   Short Term Goals: Week 1:  PT Short Term Goal 1 (Week 1): Pt will perform bed mobility with improved initiation, understanding of vc and overall MinA. PT Short Term Goal 2 (Week 1): Pt will perform sit<>stand with overall Min/ModA while adhering to sternal precautions. PT Short Term Goal 3 (Week 1): Pt will perform stand pivot transfers with overall ModA requring <50% assist with manual weight shift facilitation. PT Short Term Goal 4 (Week 1): Pt will initiate gait training. PT Short Term Goal 5 (Week 1): Pt will initiate step training.  Skilled Therapeutic Interventions/Progress Updates:  Patient supine in bed on entrance to room. Patient alert and agreeable to PT session. When asked, he is able to relate correctly that he does not currently have pants on.  Patient with no pain complaint at start of session.  Therapeutic Activity: Bed Mobility: Pt performed supine --> sit with supervision and self initiated with only a verbal request. At end of session, pt requires a few verbal requests prior to initiating return to supine. Completes with CGA for positioning and is able to position BLE in hooklying to assist with push toward HOB. VC/ tc required for initiation. Transfers: Pt performed sit<>stand and stand pivot transfers throughout session with improved initiation with vc. Guided in rise to stand with attempt to place hands on thighs for improved adherence to sternal precautions, however pt is unable to comply when initiating movement. If RW placed in front of pt, then pt reaches for RW. In w/c or armchair, pt pushes from armrests. Attempted sit<>stance training from armless chair and pt confused with poor initiation and gives up attempt to settle in seat. When facilitating rise  to stand, pt then places hands on seat and pushes from seat. Provided verbal and visual cues throughout transfer training for adhering to sternal precautions. Tactile cues and facilitation into lateral weight bearing in order to initiate steps with pivot turns.  Gait Training:  Pt ambulated 30 ft using no AD with Bil HHA and CGA. Attempted side-by-side positioning with pt requiring BUE support and so transitioned to face-to-face guidance. Demonstrated ambulation leading with LLE and step-to with RLE as well as L hip ER with toe out for foot strike. Provided vc/ tc for maintaining pace, upright posture, level gaze with visual scanning for goal seat.  Neuromuscular Re-ed: NMR facilitated during session with focus on standing balance and responding to verbal instruction/ cues. Pt guided in sit<>stand blocked practice for improved technique and increased BLE muscle activation. Attempted reach to L side for objects to move to R side with pt unable to follow verbal/ visual instructions to find objects to move. NMR performed for improvements in motor control and coordination, balance, sequencing, judgement, and self confidence/ efficacy in performing all aspects of mobility at highest level of independence.   Patient supine at end of session with brakes locked, bed alarm set, and all needs within reach.   Therapy Documentation Precautions:  Precautions Precautions: Sternal, Fall Precaution Comments: L inattention Restrictions Weight Bearing Restrictions: Yes (Sternal precautions) Other Position/Activity Restrictions: sternal precautions - keep hands close to body with mobility General:   Vital Signs:   Pain: Pain Assessment Pain Scale: 0-10 Pain Score: 0-No pain  Therapy/Group: Individual Therapy  Loel Dubonnet PT, DPT, [facility] 10/08/2022, 11:44 AM

## 2022-10-08 NOTE — Progress Notes (Signed)
PROGRESS NOTE   Subjective/Complaints:  Patient seen in hallway with PT. Endorses some frustration with being unable to urinate on his own, but does feel better with foley in. Remains extremely limited in wordfinding but intelligibility is improving; encouraged patient he is making progress and stroke recovery is a long process, to be kind and patient with himself. Patient demonstrated understanding.   ROS: Limited d/t aphasia  Objective:   No results found. Recent Labs    10/07/22 0448  WBC 15.1*  HGB 8.7*  HCT 27.6*  PLT 378    Recent Labs    10/07/22 0448  NA 141  K 3.8  CL 103  CO2 24  GLUCOSE 113*  BUN 18  CREATININE 1.22  CALCIUM 8.7*     Intake/Output Summary (Last 24 hours) at 10/08/2022 2009 Last data filed at 10/08/2022 1834 Gross per 24 hour  Intake 480 ml  Output 2675 ml  Net -2195 ml         Physical Exam: Vital Signs Blood pressure 134/62, pulse 87, temperature 98.4 F (36.9 C), resp. rate 16, weight 84.5 kg, SpO2 98 %. Physical Exam  Constitutional:      General: He is not in acute distress. AT/[place].     HENT:     Head: Normocephalic and atraumatic. + L facial droop- mild   Eyes:     Pupils: PERRLA     Comments: + R gaze preference Cardiovascular:     Rate and Rhythm: Normal rate and regular rhythm. +Murmur  Pulmonary:     Effort: Pulmonary effort is normal. No respiratory distress.     Breath sounds: Normal breath sounds. No stridor. No wheezing or rales.  Abdominal:     General: Bowel sounds are normal. There is no distension.     Palpations: Abdomen is soft.     Tenderness: There is no abdominal tenderness.   Musculoskeletal:  Antigravity and against resistance all 4 extremities; L>R weakness.   Skin:    General: Skin is warm and dry. Mild ecchymosis on nose - resolving  Neurological:     Mental Status:  Oriented to self only.  Language: Fluent but with significant  receptive and expressive aphasia. + Echololalia + perseveration - Much improved word retrieval from prior  Can follow simple commands 2/3.  Motor planning difficulty.   CN exam: + L facial droop. + L face sensory deficit.  Psychiatric:     Comments: Appropriate mood and affect.        Assessment/Plan: 1. Functional deficits which require 3+ hours per day of interdisciplinary therapy in a comprehensive inpatient rehab setting. Physiatrist is providing close team supervision and 24 hour management of active medical problems listed below. Physiatrist and rehab team continue to assess barriers to discharge/monitor patient progress toward functional and medical goals  Care Tool:  Bathing    Body parts bathed by patient: Chest, Abdomen, Face, Left upper leg, Right upper leg, Left arm   Body parts bathed by helper: Right arm, Front perineal area, Buttocks, Right lower leg, Left lower leg     Bathing assist Assist Level: Moderate Assistance - Patient 50 - 74%     Upper Body  Dressing/Undressing Upper body dressing   What is the patient wearing?: Pull over shirt    Upper body assist Assist Level: Maximal Assistance - Patient 25 - 49%    Lower Body Dressing/Undressing Lower body dressing      What is the patient wearing?: Incontinence brief, Pants     Lower body assist Assist for lower body dressing: Maximal Assistance - Patient 25 - 49%     Toileting Toileting    Toileting assist Assist for toileting: Maximal Assistance - Patient 25 - 49%     Transfers Chair/bed transfer  Transfers assist  Chair/bed transfer activity did not occur: Safety/medical concerns  Chair/bed transfer assist level: Moderate Assistance - Patient 50 - 74% (stand pivot)     Locomotion Ambulation   Ambulation assist   Ambulation activity did not occur: Safety/medical concerns (dizziness and mild diaphoresis on standing)          Walk 10 feet activity   Assist  Walk 10 feet activity  did not occur: Safety/medical concerns        Walk 50 feet activity   Assist Walk 50 feet with 2 turns activity did not occur: Safety/medical concerns         Walk 150 feet activity   Assist Walk 150 feet activity did not occur: Safety/medical concerns         Walk 10 feet on uneven surface  activity   Assist Walk 10 feet on uneven surfaces activity did not occur: Safety/medical concerns         Wheelchair     Assist Is the patient using a wheelchair?: Yes Type of Wheelchair: Manual    Wheelchair assist level: Dependent - Patient 0% Max wheelchair distance: 35 ft    Wheelchair 50 feet with 2 turns activity    Assist        Assist Level: Dependent - Patient 0%   Wheelchair 150 feet activity     Assist      Assist Level: Dependent - Patient 0%   Blood pressure 134/62, pulse 87, temperature 98.4 F (36.9 C), resp. rate 16, weight 84.5 kg, SpO2 98 %.    Medical Problem List and Plan: 1. 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  Antithrombotics: -DVT/anticoagulation: If atrial fibrillation recurs, may need to consider AC             -antiplatelet therapy: aspirin 81 mg daily             SCDs and TEDs; consider starting Lovenox 3. Pain Management: Tylenol as needed 4. Mood/Behavior/Sleep: LCSW to evaluate and provide emotional support             -antipsychotic agents: n/a 5. Neuropsych/cognition: This patient is not capable of making decisions on his own behalf. 6. Skin/Wound Care: Routine skin care checks  7. Fluids/Electrolytes/Nutrition: Strict Is and Os and follow-up chemistries             -daily weight             -continue dysphagia 2 diet/thins; SLP eval             -continue feeding  supplement  8: Heart failure with pEF (see meds below)             -monitor Mg and K             -daily weight (weight range 65kg)             -follow-up with Dr. Haroldine Laws              - uptrending, appearance euvolemic, labs with mild Cr increase but otherwise stable.   11/25: Stable  Filed Weights   10/07/22 0500 10/08/22 0340 10/08/22 0545  Weight: 82.2 kg 84.6 kg 84.5 kg     9: Leukocytosis/leukemoid reaction: Zosyn to finish 11/21 -starting Augmentin BID for 10 days             -follow-up CBC - stable 14-15  10: Post-op atrial fibrillation:  -continue amiodarone 200 mg BID for 7 days then 200 mg daily             -if recurs, consider AC  - HR well controlled; monitor    10/08/2022    7:34 PM 10/08/2022    2:02 PM 10/08/2022    5:45 AM  Vitals with BMI  Weight   186 lbs 5 oz  BMI   [id number]  Systolic [id number]   Diastolic 62 73   Pulse 87 73      11: Left renal artery stenosis: follow-up as outpatient 12: Acute blood loss anemia: follow-up CBC  - stable 13: Hyperlipidemia: continue Lipitor 40 mg daily 14: GERD/HH: no meds (Pepcid discontinued) 15: Hypertension: montior TID and prn             -continue Lopressor 12.5 mg BID             -restart losartan 50 mg daily, Spironolactone 12.5 mg as BP allows  11/15: BP well controlled; monitor  16: Acute urinary retention:             -has foley cath - Dced on admission, sarting PVRs Q6H for voiding trial             -continue Flomax 0.4 mg daily -> 0.8 mg 11/23            - 11/23: All bladder scans extremely high 650-900; increase Flomax to 0.8 mg daily, increase bladder scans to Q4H. If continued fail today will replace foley for weekend.             - 11/24: Bladder scans/ISC remain extremely high ([phone] Q4H); replacing catheter today, remain on Flomax pending another trial next week.   17: Acute kidney injury: serum creatinine improving; follow-up BMP  - stable  18. Agitation/confusion/Negative mood- per family-  didn't see myself- might need Seroquel vs Depakote to help with calming pt down - no issues - No issues overnight  19. Constipation           - LBM 11/24            - Added Sennakot-S 1 tab QHS  LOS: 4 days A FACE TO FACE EVALUATION WAS PERFORMED  Gertie Gowda 10/08/2022, 8:09 PM

## 2022-10-08 NOTE — Progress Notes (Addendum)
Occupational Therapy Session Note  Patient Details  Name: James Estrada MRN: [id number] Date of Birth: January 17, 1941  Today's Date: 10/08/2022 OT Individual Time: 1120-1205 OT Individual Time Calculation (min): 45 min    Short Term Goals: Week 1:  OT Short Term Goal 1 (Week 1): Pt will demonstrate improved L side attention to wash LUE with min cues vs mod cues to attend to arm. OT Short Term Goal 2 (Week 1): Pt will demonstrate improved motor planning to don shirt with min A OT Short Term Goal 3 (Week 1): Pt will demonstrate improved postural control to hold static stand with min A or less. OT Short Term Goal 4 (Week 1): Pt will demonstrate improved L hand function to be able to wash under R arm and hold onto washcloth.  Skilled Therapeutic Interventions/Progress Updates:    Upon OT arrival, pt transitioning to toilet via stedy with nursing. Pt agreeable to OT treatment and reports no pain. Treatment intervention with a focus on self care retraining, visual perception, attention to the L side, and sequencing. Pt aphasic and unable to state sternal precautions. Pt requires max verbal and tactile cues to maintain sternal precautions throughout session. Pt performs toilet transfer with Min A via stedy and toileting with Max A. Pt was transported infront of sink to perform hand hygiene and was unable to sequence without max verbal and tactile cues. Increased time required. Pt was transported to dayroom gym via w/c and total A. Daughter present to watch session. Pt retrieves graded clips with the  R UE and places onto graded horizontal rods using the R UE only. Pt with Mod difficulty requiring verbal and tactile cues to scan and sequence. Pt with inattention to the L side. Pt places 16 clips onto rods before pt was returned to his room via w/c and total A. Pt left in w/c at end of session with all needs met and safety measures in place. Educated daughter on pt's current level of function and deficits  present with daughter verbalizing understanding.   Therapy Documentation Precautions:  Precautions Precautions: Sternal, Fall Precaution Comments: L inattention Restrictions Weight Bearing Restrictions: Yes (Sternal precautions) Other Position/Activity Restrictions: sternal precautions - keep hands close to body with mobility   Therapy/Group: Individual Therapy  Marvetta Gibbons 10/08/2022, 12:19 PM

## 2022-10-08 NOTE — Progress Notes (Signed)
Speech Language Pathology Daily Session Note  Patient Details  Name: James Estrada MRN: [id number] Date of Birth: January 06, 1941  Today's Date: 10/08/2022 SLP Individual Time: 2202-5427 SLP Individual Time Calculation (min): 45 min  Short Term Goals: Week 1: SLP Short Term Goal 1 (Week 1): Pt will answer basic and semi-complex yes/no questions with 90% accuracy given Min A. SLP Short Term Goal 2 (Week 1): Pt will follow one-step commands on 80% of trials given Mod A multinodal cues. SLP Short Term Goal 3 (Week 1): Pt will match object or picture to word with 90% accuracy given Min A. SLP Short Term Goal 4 (Week 1): Pt will participate in responsive naming tasks with 90% given Mod A multimodal cues. SLP Short Term Goal 5 (Week 1): Pt will effectively communicate wants, needs, and ideas for ~80% of session with Mod A. SLP Short Term Goal 6 (Week 1): Pt will participate in therapeutic trials of Dysphagia 3 textures and thin liquids with minimal s/sx concerning for aspiration with Min A.  Skilled Therapeutic Interventions: Skilled ST treatment focused on language goals. Pt was greeted in bed on arrival and appeared in good spirits. Pt agreeable to transfer from bed to wheelchair for session with overall mod-to-max A multimodal cues and extended time to follow 1-step commands. SLP facilitated session in ADL apartment to promote a familiar environment with high frequency stimuli found in kitchen and bedroom areas. Pt named objects with 65% (17/26) accuracy independent of cues, progressing to 88% (23/26) accuracy with mod-to-max A verbal/visual cues. Verbal output was characterized as non-fluent with circumlocution and quite an elaborate/large vocabulary of adjectives suspect in effort to mask deficits. Pt continues to exhibit significant difficulty with word finding of general content words and nouns with frequent paraphasias and occasional neologisms. Pt appeared to exhibit difficulty visually tracking  target stimuli therefore question visual deficits. At end of session, SLP provided education on communication strategies and update regarding pt's performance during today's session. Patient was left in wheelchair with alarm activated and immediate needs within reach at end of session. Continue per current plan of care.       Pain  None/denied  Therapy/Group: Individual Therapy  Tamala Ser 10/08/2022, 9:53 AM

## 2022-10-08 NOTE — Progress Notes (Signed)
Physical Therapy Session Note  Patient Details  Name: James Estrada MRN: [id number] Date of Birth: [birth year]  Today's Date: 10/08/2022 PT Individual Time: 0803-0900 PT Individual Time Calculation (min): 57 min   Short Term Goals: Week 1:  PT Short Term Goal 1 (Week 1): Pt will perform bed mobility with improved initiation, understanding of vc and overall MinA. PT Short Term Goal 2 (Week 1): Pt will perform sit<>stand with overall Min/ModA while adhering to sternal precautions. PT Short Term Goal 3 (Week 1): Pt will perform stand pivot transfers with overall ModA requring <50% assist with manual weight shift facilitation. PT Short Term Goal 4 (Week 1): Pt will initiate gait training. PT Short Term Goal 5 (Week 1): Pt will initiate step training.  Skilled Therapeutic Interventions/Progress Updates:   Pt received supine in bed and agreeable to PT. Supine>sit transfer with supervision assist for safety. Sitting balance EOB x 3 min without assist from PT.   Stand pivot transfer with CGA for stability of LUE on RW.   Gait training with RW in rehab gym 53f x2, 539fand 4054fith min assist for safety, control of AD and improved weight shift. Significantly improved postural control compared to prior session. BP assess prior to (117/99 ,HR 77) and following gait (132/66, HR 88)   Pt performed dynamic balance training on airex pad; cross body reach to obtain connect 4 piece and place in game board. Max cues for initiation of movement and visual scanning to the L to locate game pieces. Performed 2 x 15 with LUE only supported on RW. CGA-min assist for balance. Max multimodal cues to step backward off airex pad on each session due to apraxia.   Pt performed color naming bean bags. Able to identify color when only one option available, x 6 attempted sorting or color naming with 3 then 2 options, but pt unable to identify proper color when more than 1 option available.   Patient returned to room  and performed stand pivot to recliner with RW and CGA for safety. Pt left sitting in recliner with call bell in reach and all needs met.        Therapy Documentation Precautions:  Precautions Precautions: Sternal, Fall Precaution Comments: L inattention Restrictions Weight Bearing Restrictions: Yes (Sternal precautions) Other Position/Activity Restrictions: sternal precautions - keep hands close to body with mobility  Vital Signs: Therapy Vitals Temp: 98.5 F (36.9 C) Pulse Rate: 73 Resp: 16 BP: 136/73 Patient Position (if appropriate): Lying Oxygen Therapy SpO2: 97 % O2 Device: Room Air Pain:  denies  Therapy/Group: Individual Therapy  AusLorie Phenix/25/2023, 3:04 PM

## 2022-10-10 DIAGNOSIS — I63031 Cerebral infarction due to thrombosis of right carotid artery: Secondary | ICD-10-CM

## 2022-10-10 LAB — BASIC METABOLIC PANEL
Anion gap: 8 (ref 5–15)
BUN: 17 mg/dL (ref 8–23)
CO2: 23 mmol/L (ref 22–32)
Calcium: 8.2 mg/dL — ABNORMAL LOW (ref 8.9–10.3)
Chloride: 106 mmol/L (ref 98–111)
Creatinine, Ser: 1.13 mg/dL (ref 0.61–1.24)
GFR, Estimated: >60 mL/min (ref >60)
Glucose, Bld: 110 mg/dL — ABNORMAL HIGH (ref 70–99)
Potassium: 4 mmol/L (ref 3.5–5.1)
Sodium: 137 mmol/L (ref 135–145)

## 2022-10-10 LAB — CBC
HCT: 29.3 % — ABNORMAL LOW (ref 39.0–52.0)
Hemoglobin: 9 g/dL — ABNORMAL LOW (ref 13.0–17.0)
MCH: 27.4 pg (ref 26.0–34.0)
MCHC: 30.7 g/dL (ref 30.0–36.0)
MCV: 89.1 fL (ref 80.0–100.0)
Platelets: 247 10*3/uL (ref 150–400)
RBC: 3.29 MIL/uL — ABNORMAL LOW (ref 4.22–5.81)
RDW: 15 % (ref 11.5–15.5)
WBC: 10.4 10*3/uL (ref 4.0–10.5)
nRBC: 0 % (ref 0.0–0.2)

## 2022-10-10 NOTE — Progress Notes (Signed)
Physical Therapy Session Note  Patient Details  Name: James Estrada MRN: [id number] Date of Birth: 11/15/1940  Today's Date: 10/10/2022 PT Individual Time: 1047-1130 PT Individual Time Calculation (min): 43 min   Short Term Goals: Week 1:  PT Short Term Goal 1 (Week 1): Pt will perform bed mobility with improved initiation, understanding of vc and overall MinA. PT Short Term Goal 2 (Week 1): Pt will perform sit<>stand with overall Min/ModA while adhering to sternal precautions. PT Short Term Goal 3 (Week 1): Pt will perform stand pivot transfers with overall ModA requring <50% assist with manual weight shift facilitation. PT Short Term Goal 4 (Week 1): Pt will initiate gait training. PT Short Term Goal 5 (Week 1): Pt will initiate step training.  Skilled Therapeutic Interventions/Progress Updates:      Therapy Documentation Precautions:  Precautions Precautions: Sternal, Fall Precaution Comments: L inattention Restrictions Weight Bearing Restrictions: Yes (Sternal precautions) Other Position/Activity Restrictions: sternal precautions - keep hands close to body with mobility  Pt agreeable to PT session and declines pain. Pt transported by w/c to main gym for time management and energy conservation. Pt requires SBA with sit to stand transfer x 3 in parallel bars. Pt unable to follow simple commands for standing dynamic reaching task with horse shoe despite visual and verbal cueing. Pt reported dizziness and BP recorded as 92/66 (76). In sitting, pt utilized L UE to aim horse shoe for target positioned to left side to encourage increased attention to the left. Pt transported to room for time management and energy conservation and left seated in w/c at bedside with chair alarm on and all needs within reach.    Therapy/Group: Individual Therapy  Truitt Leep Truitt Leep PT, DPT  10/10/2022, 10:42 AM

## 2022-10-10 NOTE — Discharge Instructions (Addendum)
Inpatient Rehab Discharge Instructions  James Estrada Discharge date and time: 10/20/2022  Activities/Precautions/ Functional Status: Activity: no lifting, driving, or strenuous exercise until cleared by MD Diet: cardiac diet Wound Care: none needed  Functional status:  ___ No restrictions     ___ Walk up steps independently ___ 24/7 supervision/assistance   ___ Walk up steps with assistance __x_ Intermittent supervision/assistance  ___ Bathe/dress independently ___ Walk with walker     ___ Bathe/dress with assistance ___ Walk Independently    ___ Shower independently ___ Walk with assistance    _x__ Shower with assistance _x__ No alcohol   Special Instructions: No driving, alcohol consumption or tobacco use.  COMMUNITY REFERRALS UPON DISCHARGE:    Home Health:   PT     OT     ST    RN                Agency: [facility]  Phone: [phone]    Medical Equipment/Items Ordered:Rolling Dan Humphreys, Bedside Commode and Shower Seat                                                 Agency/Supplier: [employer] [phone]      STROKE/TIA DISCHARGE INSTRUCTIONS SMOKING Cigarette smoking nearly doubles your risk of having a stroke & is the single most alterable risk factor  If you smoke or have smoked in the last 12 months, you are advised to quit smoking for your health. Most of the excess cardiovascular risk related to smoking disappears within a year of stopping. Ask you doctor about anti-smoking medications [place] Quit Line: 1-800-QUIT NOW Free Smoking Cessation Classes [phone]  CHOLESTEROL Know your levels; limit fat & cholesterol in your diet  Lipid Panel     Component Value Date/Time   CHOL 82 09/19/2022 0528   CHOL 146 05/28/2020 0819   TRIG 92 09/19/2022 0528   HDL 22 (L) 09/19/2022 0528   HDL 43 05/28/2020 0819   CHOLHDL 3.7 09/19/2022 0528   VLDL 18 09/19/2022 0528   LDLCALC 42 09/19/2022 0528   LDLCALC 87 05/28/2020 0819     Many patients benefit from treatment even if  their cholesterol is at goal. Goal: Total Cholesterol (CHOL) less than 160 Goal:  Triglycerides (TRIG) less than 150 Goal:  HDL greater than 40 Goal:  LDL (LDLCALC) less than 100   BLOOD PRESSURE American Stroke Association blood pressure target is less that 120/80 mm/Hg  Your discharge blood pressure is:  BP: 114/73 Monitor your blood pressure Limit your salt and alcohol intake Many individuals will require more than one medication for high blood pressure  DIABETES (A1c is a blood sugar average for last 3 months) Goal HGBA1c is under 7% (HBGA1c is blood sugar average for last 3 months)  Diabetes: No known diagnosis of diabetes    Lab Results  Component Value Date   HGBA1C 5.5 09/19/2022    Your HGBA1c can be lowered with medications, healthy diet, and exercise. Check your blood sugar as directed by your physician Call your physician if you experience unexplained or low blood sugars.  PHYSICAL ACTIVITY/REHABILITATION Goal is 30 minutes at least 4 days per week  Activity: Increase activity slowly, Therapies: Physical Therapy: Home Health, Occupational Therapy: Home Health, and Speech Therapy: Home Health Return to work: n/a Activity decreases your risk of heart attack and stroke and makes  your heart stronger.  It helps control your weight and blood pressure; helps you relax and can improve your mood. Participate in a regular exercise program. Talk with your doctor about the best form of exercise for you (dancing, walking, swimming, cycling).  DIET/WEIGHT Goal is to maintain a healthy weight  Your discharge diet is:  Diet Order             Diet regular Room service appropriate? Yes; Fluid consistency: Thin  Diet effective now                  thin liquids Your height is:    Your current weight is: Weight: 75.4 kg Your Body Mass Index (BMI) is:  BMI (Calculated): 22.72 Following the type of diet specifically designed for you will help prevent another stroke. Your goal weight  range is:   Your goal Body Mass Index (BMI) is 19-24. Healthy food habits can help reduce 3 risk factors for stroke:  High cholesterol, hypertension, and excess weight.  RESOURCES Stroke/Support Group:  Call [phone]   STROKE EDUCATION PROVIDED/REVIEWED AND GIVEN TO PATIENT Stroke warning signs and symptoms How to activate emergency medical system (call 911). Medications prescribed at discharge. Need for follow-up after discharge. Personal risk factors for stroke. Pneumonia vaccine given: No Flu vaccine given: Yes, Date 10/19/2022 My questions have been answered, the writing is legible, and I understand these instructions.  I will adhere to these goals & educational materials that have been provided to me after my discharge from the hospital.     My questions have been answered and I understand these instructions. I will adhere to these goals and the provided educational materials after my discharge from the hospital.  Patient/Caregiver Signature _______________________________ Date __________  Clinician Signature _______________________________________ Date __________  Please bring this form and your medication list with you to all your follow-up doctor's appointments.

## 2022-10-10 NOTE — Progress Notes (Signed)
PROGRESS NOTE   Subjective/Complaints:  Pt states he is "mostly right handed"  Has both receptive and expressive aphasia   ROS: Limited d/t aphasia  Objective:   No results found. Recent Labs    10/10/22 0513  WBC 10.4  HGB 9.0*  HCT 29.3*  PLT 247    Recent Labs    10/10/22 0513  NA 137  K 4.0  CL 106  CO2 23  GLUCOSE 110*  BUN 17  CREATININE 1.13  CALCIUM 8.2*     Intake/Output Summary (Last 24 hours) at 10/10/2022 1120 Last data filed at 10/10/2022 0817 Gross per 24 hour  Intake 1433 ml  Output 2575 ml  Net -1142 ml         Physical Exam: Vital Signs Blood pressure (!) 109/56, pulse 66, temperature 98.5 F (36.9 C), temperature source Oral, resp. rate 18, weight 79.2 kg, SpO2 99 %. Physical Exam   General: No acute distress Mood and affect are appropriate Heart: Regular rate and rhythm no rubs murmurs or extra sounds Lungs: Clear to auscultation, breathing unlabored, no rales or wheezes Abdomen: Positive bowel sounds, soft nontender to palpation, nondistended Extremities: No clubbing, cyanosis, or edema Skin: No evidence of breakdown, no evidence of rash  Musculoskeletal:  Antigravity and against resistance all 4 extremities; L>R weakness.   Skin:    General: Skin is warm and dry. Mild ecchymosis on nose - resolving  Neurological:     Mental Status:  Oriented to self only.  Language: Fluent but with significant receptive and expressive aphasia. + Echololalia + perseveration - Much improved word retrieval from prior  Can follow simple commands 2/3.  Motor planning difficulty.   CN exam: + L facial droop. + L face sensory deficit.  Psychiatric:     Comments: Appropriate mood and affect.        Assessment/Plan: 1. Functional deficits which require 3+ hours per day of interdisciplinary therapy in a comprehensive inpatient rehab setting. Physiatrist is providing close team  supervision and 24 hour management of active medical problems listed below. Physiatrist and rehab team continue to assess barriers to discharge/monitor patient progress toward functional and medical goals  Care Tool:  Bathing    Body parts bathed by patient: Chest, Abdomen, Face, Left upper leg, Right upper leg, Left arm   Body parts bathed by helper: Right arm, Front perineal area, Buttocks, Right lower leg, Left lower leg     Bathing assist Assist Level: Moderate Assistance - Patient 50 - 74%     Upper Body Dressing/Undressing Upper body dressing   What is the patient wearing?: Pull over shirt    Upper body assist Assist Level: Maximal Assistance - Patient 25 - 49%    Lower Body Dressing/Undressing Lower body dressing      What is the patient wearing?: Incontinence brief, Pants     Lower body assist Assist for lower body dressing: Maximal Assistance - Patient 25 - 49%     Toileting Toileting    Toileting assist Assist for toileting: Maximal Assistance - Patient 25 - 49%     Transfers Chair/bed transfer  Transfers assist  Chair/bed transfer activity did not occur: Safety/medical concerns  Chair/bed transfer assist level: Moderate Assistance - Patient 50 - 74% (stand pivot)     Locomotion Ambulation   Ambulation assist   Ambulation activity did not occur: Safety/medical concerns (dizziness and mild diaphoresis on standing)          Walk 10 feet activity   Assist  Walk 10 feet activity did not occur: Safety/medical concerns        Walk 50 feet activity   Assist Walk 50 feet with 2 turns activity did not occur: Safety/medical concerns         Walk 150 feet activity   Assist Walk 150 feet activity did not occur: Safety/medical concerns         Walk 10 feet on uneven surface  activity   Assist Walk 10 feet on uneven surfaces activity did not occur: Safety/medical concerns         Wheelchair     Assist Is the patient using  a wheelchair?: Yes Type of Wheelchair: Manual    Wheelchair assist level: Dependent - Patient 0% Max wheelchair distance: 35 ft    Wheelchair 50 feet with 2 turns activity    Assist        Assist Level: Dependent - Patient 0%   Wheelchair 150 feet activity     Assist      Assist Level: Dependent - Patient 0%   Blood pressure (!) 109/56, pulse 66, temperature 98.5 F (36.9 C), temperature source Oral, resp. rate 18, weight 79.2 kg, SpO2 99 %.    Medical Problem List and Plan: 1. 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  Antithrombotics: -DVT/anticoagulation: If atrial fibrillation recurs, may need to consider AC             -antiplatelet therapy: aspirin 81 mg daily             SCDs and TEDs; consider starting Lovenox 3. Pain Management: Tylenol as needed 4. Mood/Behavior/Sleep: LCSW to evaluate and provide emotional support             -antipsychotic agents: n/a 5. Neuropsych/cognition: This patient is not capable of making decisions on his own behalf. 6. Skin/Wound Care: Routine skin care checks  7. Fluids/Electrolytes/Nutrition: Strict Is and Os and follow-up chemistries             -daily weight             -continue dysphagia 2 diet/thins; SLP eval             -continue feeding supplement  8: Heart failure with pEF (see meds below)             -monitor Mg and K             -daily weight (weight range 65kg)             -follow-up with Dr. Haroldine Laws              - uptrending, appearance euvolemic, labs with mild Cr increase but otherwise stable.   11/25: Stable  Filed Weights   10/08/22 0545 10/09/22 0455 10/10/22 0335  Weight: 84.5 kg 84.5 kg  79.2 kg     9:  Leukocytosis/leukemoid reaction: resolved  -completed Zosyn, starting Augmentin BID for 10 days thru 12/1                 Latest Ref Rng & Units 10/10/2022    5:13 AM 10/07/2022    4:48 AM 10/05/2022    6:18 AM  CBC  WBC 4.0 - 10.5 K/uL 10.4  15.1  13.9   Hemoglobin 13.0 - 17.0 g/dL 9.0  8.7  8.3   Hematocrit 39.0 - 52.0 % 29.3  27.6  25.9   Platelets 150 - 400 K/uL 247  378  310      10: Post-op atrial fibrillation:  -continue amiodarone 200 mg BID for 7 days then 200 mg daily             -if recurs, consider AC  - HR well controlled; monitor    10/10/2022    3:35 AM 10/09/2022    9:06 PM 10/09/2022    7:27 PM  Vitals with BMI  Weight 174 lbs 10 oz    BMI 23.68    Systolic 109 112 299  Diastolic 56 66 64  Pulse 66 77 73     11: Left renal artery stenosis: follow-up as outpatient 12: Acute blood loss anemia: follow-up CBC  - stable 13: Hyperlipidemia: continue Lipitor 40 mg daily 14: GERD/HH: no meds (Pepcid discontinued) 15: Hypertension: montior TID and prn             -continue Lopressor 12.5 mg BID             -restart losartan 50 mg daily, Spironolactone 12.5 mg as BP allows  Controlled as above 11/27  16: Acute urinary retention:             -has foley cath - Dced on admission, sarting PVRs Q6H for voiding trial             -continue Flomax 0.4 mg daily -> 0.8 mg 11/23            -failed voiding trial x 1   17: Acute kidney injury: serum creatinine improving; follow-up BMP    Latest Ref Rng & Units 10/10/2022    5:13 AM 10/07/2022    4:48 AM 10/05/2022    6:18 AM  BMP  Glucose 70 - 99 mg/dL 371  696  789   BUN 8 - 23 mg/dL 17  18  18    Creatinine 0.61 - 1.24 mg/dL  3.81  0.17   Sodium 135 - 145 mmol/L 137  141  137   Potassium 3.5 - 5.1 mmol/L 4.0  3.8  3.9   Chloride 98 - 111 mmol/L 106  103  107   CO2 22 - 32 mmol/L 23  24  23    Calcium 8.9 - 10.3 mg/dL 8.2  8.7  8.1      18. Agitation/confusion/Negative mood- per family- didn't see  myself- might need Seroquel vs Depakote to help with calming pt down - no issues - No issues overnight  19. Constipation           - LBM 11/24            - Added Sennakot-S 1 tab QHS  LOS: 6 days A FACE TO FACE EVALUATION WAS PERFORMED  10/10/2022, 11:20 AM

## 2022-10-10 NOTE — Progress Notes (Signed)
Occupational Therapy Session Note  Patient Details  Name: James Estrada MRN: [id number] Date of Birth: 28-Jan-1941  Today's Date: 10/10/2022 OT Individual Time: 2536-6440 OT Individual Time Calculation (min): 64 min    Short Term Goals: Week 1:  OT Short Term Goal 1 (Week 1): Pt will demonstrate improved L side attention to wash LUE with min cues vs mod cues to attend to arm. OT Short Term Goal 2 (Week 1): Pt will demonstrate improved motor planning to don shirt with min A OT Short Term Goal 3 (Week 1): Pt will demonstrate improved postural control to hold static stand with min A or less. OT Short Term Goal 4 (Week 1): Pt will demonstrate improved L hand function to be able to wash under R arm and hold onto washcloth.  Skilled Therapeutic Interventions/Progress Updates:  Pt received resting in bed for skilled OT session with focus on ADL retraining and LUE strengthening. Pt agreeable to interventions, demonstrating overall pleasant mood. Pt with no reports of pain, stating "I'm not sad or gleeful. . .just another day" in reference to being asked about his mood.   Pt performs WC<>toilet stand pivot transfers with Min A + grab bars, requiring mod verbal/gesture cues for hand placement. Pt doffs/dons pants/brief with Min A after initiated by therapist and with increased time. In standing with Mod A+ grab bar, pt performs peri-care when toilet paper was shown to him and a simple "wipe" cue was provided. Pt requires A to wipe once for thoroughness and energy conservation. At sink level, pt cleans his hands with Min verbal/gesture cues for initiation of task and sequencing.   Pt dependent for WC transport to therapy gym. In therapy gym, pt performs WC<>EOM transfer with Min A + RW. Pt then asked to participate in visual scanning activity focused on identifying letters on the L side of his visual field. Pt unable to follow instructions provided, although able to name letters when pointed out and/or  circled/grouped by OT. Pt continues to demonstrate strong L-sided visual inattention, unable to follow compensatory technique of  "turn your head to the left" but successful when OT asked "turn towards my voice."   Pt then engages in BUE strengthening activity with use of mirror and "squigz." Pt asked to place "squigz" on mirror, using R-hand to place, and L-hand to retrieve. Pt places/retrieves ~8-10 squigz, needing heavy verbal/gesture cuing for encouragement and L-hand integration. Pt benefiting from OT holding his R-hand when retrieving "squigz" to promote use of L-hand and target functional grasp. Pt observed to place "squigz" on R-side of mirror, unable to place them on the left despite multimodal cuing, and two visual anchors provided.   Pt remained seated in [facility] with all immediate needs meet and lap belt alarmed at end of session. Pt continues to be appropriate for skilled OT intervention to address promote further functional independence.    Therapy Documentation Precautions:  Precautions Precautions: Sternal, Fall Precaution Comments: L inattention Restrictions Weight Bearing Restrictions: Yes (Sternal precautions) Other Position/Activity Restrictions: sternal precautions - keep hands close to body with mobility    Therapy/Group: Individual Therapy  Isabella Stalling 10/10/2022, 1:20 PM

## 2022-10-10 NOTE — Progress Notes (Signed)
Speech Language Pathology Daily Session Note  Patient Details  Name: James Estrada MRN: [id number] Date of Birth: 09-25-1941  Today's Date: 10/10/2022 SLP Individual Time: 0803-0900 SLP Individual Time Calculation (min): 57 min  Short Term Goals: Week 1: SLP Short Term Goal 1 (Week 1): Pt will answer basic and semi-complex yes/no questions with 90% accuracy given Min A. SLP Short Term Goal 2 (Week 1): Pt will follow one-step commands on 80% of trials given Mod A multinodal cues. SLP Short Term Goal 3 (Week 1): Pt will match object or picture to word with 90% accuracy given Min A. SLP Short Term Goal 4 (Week 1): Pt will participate in responsive naming tasks with 90% given Mod A multimodal cues. SLP Short Term Goal 5 (Week 1): Pt will effectively communicate wants, needs, and ideas for ~80% of session with Mod A. SLP Short Term Goal 6 (Week 1): Pt will participate in therapeutic trials of Dysphagia 3 textures and thin liquids with minimal s/sx concerning for aspiration with Min A.  Skilled Therapeutic Interventions: Skilled ST treatment focused on language goals. Pt transferred from bed to wheelchair with mod A verbal and visual cues. SLP provided education to spouse on communication strategies including providing limited field of choices. SLP demonstrated strategy in use by having pt select from field of 2 choices to discuss what he ate for breakfast this morning with 75% accuracy given min A verbal cues. SLP facilitated picture to object matching with 28% accuracy with mod A multimodal cues, progressing to 57% accuracy with max A multimodal cues. Pt responded to yes/no questions to identify objects (e.g., "is this a pen?") with 40% accuracy given max A multimodal cues. Pt often verbally repeated and perseverated on SLP's verbal prompts however exhibited increased difficulty understanding task expectations and therefore not able to provide a response to initial question(s). SLP facilitated  object naming using items from [facility] kit with 72% accuracy given min A verbal cues. Pt participated in a responsive naming task with 81% accuracy given min A verbal cues. Pt appears to be demonstrating improved verbal expression including naming skills but with minimal gains noted with receptive language.   SLP facilitated dysphagia 3 PO trials with mildly prolonged but effective mastication and overall preparation, minimal oral residuals, and no overt s/sx of aspiration. Pt required min A verbal cues to reduce bite sizes to optimize safety and sup A for feeding assist. SLP recommends diet advancement to dysphagia 3 textures and thin liquids at this time. Continue pills crushed in puree and full supervision during meals. Pt verbalized agreement with plan.  Patient was left in wheelchair with alarm activated and immediate needs within reach at end of session. Continue per current plan of care.      Pain  None/denied  Therapy/Group: Individual Therapy  Patty Sermons 10/10/2022, 8:18 AM

## 2022-10-10 NOTE — Progress Notes (Signed)
Physical Therapy Session Note  Patient Details  Name: James Estrada MRN: [id number] Date of Birth: 30-Mar-1941  Today's Date: 10/10/2022 PT Individual Time: 0906-1015 PT Individual Time Calculation (min): 69 min   Short Term Goals: Week 1:  PT Short Term Goal 1 (Week 1): Pt will perform bed mobility with improved initiation, understanding of vc and overall MinA. PT Short Term Goal 2 (Week 1): Pt will perform sit<>stand with overall Min/ModA while adhering to sternal precautions. PT Short Term Goal 3 (Week 1): Pt will perform stand pivot transfers with overall ModA requring <50% assist with manual weight shift facilitation. PT Short Term Goal 4 (Week 1): Pt will initiate gait training. PT Short Term Goal 5 (Week 1): Pt will initiate step training. Week 2:     Skilled Therapeutic Interventions/Progress Updates:  - orthostatic vitals low - dizziness on standing - able to stand with simple cues - does not use RW to assist into standing - NMR with focus on L attention. Unable to toe tap L foot to step despite vc and then visual cue. Continues to raise R foot to step. Requires facilitation of weight shift to R with therapist initiating bend to L knee in order to perform. Then performs x3. Shifts to R despite cues to continue with LLE. Continued performance with RLE throughout session requiring mirror with minimal improvement.   Pt will repeat "lift left foot to step" after verbal instruction. Then lift RLE. When related to pt that he lifted the RLE, he looked frustrated/ disappointed at times.  Therapy Documentation Precautions:  Precautions Precautions: Sternal, Fall Precaution Comments: L inattention Restrictions Weight Bearing Restrictions: Yes (Sternal precautions) Other Position/Activity Restrictions: sternal precautions - keep hands close to body with mobility General:   Vital Signs: Therapy Vitals Patient Position (if appropriate): Orthostatic Vitals Pain: Pain  Assessment Pain Scale: 0-10 Pain Score: 0-No pain  Therapy/Group: Individual Therapy  Loel Dubonnet PT, DPT, CSRS 10/10/2022, 10:26 AM

## 2022-10-10 NOTE — Progress Notes (Addendum)
Pt significant other reported him having lower back pain. Does endorse having lower back pain. Assessed location where pain was reported (coccygeal area) and skin is intact. Given ordered PRN medication for pain. Repositioned patient in bed.  Tula Nakayama, LPN

## 2022-10-11 DIAGNOSIS — I63031 Cerebral infarction due to thrombosis of right carotid artery: Secondary | ICD-10-CM | POA: Diagnosis not present

## 2022-10-11 MED ORDER — CITALOPRAM HYDROBROMIDE 10 MG PO TABS
10.0000 mg | ORAL_TABLET | Freq: Every day | ORAL | Status: DC
  Administered 2022-10-11 – 2022-10-20 (×10): 10 mg via ORAL
  Filled 2022-10-11 (×10): qty 1

## 2022-10-11 NOTE — Progress Notes (Signed)
Physical Therapy Session Note  Patient Details  Name: James Estrada MRN: [id number] Date of Birth: [birth year]  Today's Date: 10/11/2022 PT Individual Time: 1103-1201 PT Individual Time Calculation (min): 58 min   Short Term Goals: Week 1:  PT Short Term Goal 1 (Week 1): Pt will perform bed mobility with improved initiation, understanding of vc and overall MinA. PT Short Term Goal 2 (Week 1): Pt will perform sit<>stand with overall Min/ModA while adhering to sternal precautions. PT Short Term Goal 3 (Week 1): Pt will perform stand pivot transfers with overall ModA requring <50% assist with manual weight shift facilitation. PT Short Term Goal 4 (Week 1): Pt will initiate gait training. PT Short Term Goal 5 (Week 1): Pt will initiate step training.  Skilled Therapeutic Interventions/Progress Updates:  Patient seated upright in w/c on entrance to room. Patient alert and agreeable to PT session.   Patient with no pain complaint at start of session.  Therapeutic Activity: Transfers: Pt performed sit<>stand and stand pivot transfers throughout session with supervision and CGA for balance upon reaching stance. Continues to demo decreased confidence in rising to stand without UE support. Pt also continues to reach out for RW if placed in front of him. But easily pushes from armrests for rise to stand with no AD in front of him. Provided vc/visual cues for technique.  Pt's BP taken in seated position and then following activity: - seated = 97/68 with pulse of 70. - after activity = 86/62 with pulse of 71.   Minimal indication of dizzines or lightheadedness. No description of panic.   Gait Training:  Pt ambulated 92' x1/ 60' x1 using RW with CGA/ intermittent MinA for missteps. Able to maneuver around obstacles in day room. Provided vc/ tc for level gaze, head turns for scanning environment.  Neuromuscular Re-ed: NMR facilitated during session with focus on visual scanning. Pt guided in  BITS training with locating large colored dot on screen and then using L hand to touch. R hand in mitt which easily forces use of L hand. Despite pt locating dot to L side with eye movement, pt is unable to motor plan to touch dot. If dot on R side of screen, pt initially initiating movement on his own and touching dot, however over time, becomes confused with task. Attempt in seated position and change in color of dot with no change in ability to progress and move to locate or touch dot on screen. NMR performed for improvements in motor control and coordination, balance, judgement, and self confidence/ efficacy in performing all aspects of mobility at highest level of independence.   Patient seated in w/c at end of session with brakes locked, belt alarm set, and all needs within reach. Oriented to time and time prior to next session. Dtr in room.   Therapy Documentation Precautions:  Precautions Precautions: Sternal, Fall Precaution Comments: L inattention Restrictions Weight Bearing Restrictions: Yes Other Position/Activity Restrictions: sternal precautions - keep hands close to body with mobility General:   Vital Signs: Therapy Vitals Temp: 98.6 F (37 C) Pulse Rate: 72 Resp: 18 BP: 130/75 Patient Position (if appropriate): Lying Oxygen Therapy SpO2: 100 % O2 Device: Room Air Pain:  No pain complaint during session.   Therapy/Group: Individual Therapy  Loel Dubonnet PT, DPT, CSRS 10/11/2022, 5:18 PM

## 2022-10-11 NOTE — Progress Notes (Signed)
Physical Therapy Session Note  Patient Details  Name: James Estrada MRN: [id number] Date of Birth: Jan 21, 1941  Today's Date: 10/11/2022 PT Individual Time: 2878-6767 PT Individual Time Calculation (min): 32 min   Short Term Goals: Week 1:  PT Short Term Goal 1 (Week 1): Pt will perform bed mobility with improved initiation, understanding of vc and overall MinA. PT Short Term Goal 2 (Week 1): Pt will perform sit<>stand with overall Min/ModA while adhering to sternal precautions. PT Short Term Goal 3 (Week 1): Pt will perform stand pivot transfers with overall ModA requring <50% assist with manual weight shift facilitation. PT Short Term Goal 4 (Week 1): Pt will initiate gait training. PT Short Term Goal 5 (Week 1): Pt will initiate step training. Week 2:     Skilled Therapeutic Interventions/Progress Updates:      Therapy Documentation Precautions:  Precautions Precautions: Sternal, Fall Precaution Comments: L inattention Restrictions Weight Bearing Restrictions: Yes Other Position/Activity Restrictions: sternal precautions - keep hands close to body with mobility General:   Vital Signs: Therapy Vitals Temp: 98.6 F (37 C) Pulse Rate: 72 Resp: 18 BP: 130/75 Patient Position (if appropriate): Lying Oxygen Therapy SpO2: 100 % O2 Device: Room Air Pain:   Mobility:   Locomotion :    Trunk/Postural Assessment :    Balance:   Exercises:   Other Treatments:      Therapy/Group: Individual Therapy  Loel Dubonnet PT, DPT, CSRS 10/11/2022, 5:26 PM

## 2022-10-11 NOTE — Progress Notes (Signed)
Occupational Therapy Session Note  Patient Details  Name: James Estrada MRN: [id number] Date of Birth: [birth year]  Today's Date: 10/11/2022 OT Individual Time: 1010-1105 OT Individual Time Calculation (min): 55 min    Short Term Goals: Week 1:  OT Short Term Goal 1 (Week 1): Pt will demonstrate improved L side attention to wash LUE with min cues vs mod cues to attend to arm. OT Short Term Goal 2 (Week 1): Pt will demonstrate improved motor planning to don shirt with min A OT Short Term Goal 3 (Week 1): Pt will demonstrate improved postural control to hold static stand with min A or less. OT Short Term Goal 4 (Week 1): Pt will demonstrate improved L hand function to be able to wash under R arm and hold onto washcloth.  Skilled Therapeutic Interventions/Progress Updates:    Pt sitting in the w/c with no c/o pain, wife present initially. Discussed doing shower today and he decided to hold off until tomorrow. Pt taken to the therapy gym via w/c. Focus of session on L attention, inclusion/forced use of the LUE and dynamic postural control. He required min A to transfer to the mat with BUE on OT to reduce pushing through chair, and ensure adherence to sternal precautions. Pt sat EOM and required mod manual facilitation for head turn L at the cervical spine, and then pt was able to come back to the L with mod verbal cueing. Different colored clothespins were placed along pt's clothes on the L side and he was forced to use the LUE. Introduced CIMT principles- placed a mitt on his RUE to reduce RUE taking over for the L. Overall pt required min-mod facilitation to grasp clothespins with a pincer grasp with enough strength to manipulate clothespin. He also worked on reaching forward within sternal precautions, pt able to actively reach overhead with the LUE. Within session he was able to use his LUE much more functionally with the R hand mitted. He began using the LUE to scratch his face and integrate the  limb more functionally. Overall pt able to attend to the L visual field more with increased repetition. He returned to his chair and was passed off to PT in hallway.     Therapy Documentation Precautions:  Precautions Precautions: Sternal, Fall Precaution Comments: L inattention Restrictions Weight Bearing Restrictions: Yes Other Position/Activity Restrictions: sternal precautions - keep hands close to body with mobility Therapy/Group: Individual Therapy  Crissie Reese 10/11/2022, 6:15 AM

## 2022-10-11 NOTE — Progress Notes (Signed)
Patient ID: James Estrada, male   DOB: 04-28-1941, 81 y.o.   MRN: [id number] Met with the patient and wife to review current situation, team conference rehab process and plan of care. Discussed secondary risks including HTN, PAF (amiodarone) HLD (lipitor) , medications, and dietary modifications. Reviewed urinary retention management with indwelling foley on flomax after voiding trial. Also reviewed addition of antidepressant per MD with dietary tips for anxiety management. Reviewed care of indwelling foley at discharge with over-night bag and leg bag during the day. Continue to follow along to address educational needs to facilitate preparation for discharge home with wife. Margarito Liner

## 2022-10-11 NOTE — Progress Notes (Signed)
PROGRESS NOTE   Subjective/Complaints:  Discussed urinary retention and anxiety concerns with wife   ROS: Limited d/t aphasia  Objective:   No results found. Recent Labs    10/10/22 0513  WBC 10.4  HGB 9.0*  HCT 29.3*  PLT 247    Recent Labs    10/10/22 0513  NA 137  K 4.0  CL 106  CO2 23  GLUCOSE 110*  BUN 17  CREATININE 1.13  CALCIUM 8.2*     Intake/Output Summary (Last 24 hours) at 10/11/2022 0837 Last data filed at 10/11/2022 0500 Gross per 24 hour  Intake 780 ml  Output 3275 ml  Net -2495 ml         Physical Exam: Vital Signs Blood pressure 121/60, pulse 71, temperature 98.6 F (37 C), temperature source Oral, resp. rate 18, weight 81 kg, SpO2 100 %. Physical Exam   General: No acute distress Mood and affect are appropriate Heart: Regular rate and rhythm no rubs murmurs or extra sounds Lungs: Clear to auscultation, breathing unlabored, no rales or wheezes Abdomen: Positive bowel sounds, soft nontender to palpation, nondistended Extremities: No clubbing, cyanosis, or edema Skin: No evidence of breakdown, no evidence of rash  Musculoskeletal:  Antigravity and against resistance all 4 extremities; L>R weakness.   Skin:    General: Skin is warm and dry. Mild ecchymosis on nose - resolving  Neurological:     Mental Status:  Oriented to self only.  Language: Fluent but with significant receptive and expressive aphasia. perseveration -  Can follow simple commands 2/3. +apraxia Motor planning difficulty.   CN exam: + L facial droop. + L face sensory deficit.  Psychiatric:     Comments: Appropriate mood and affect.        Assessment/Plan: 1. Functional deficits which require 3+ hours per day of interdisciplinary therapy in a comprehensive inpatient rehab setting. Physiatrist is providing close team supervision and 24 hour management of active medical problems listed  below. Physiatrist and rehab team continue to assess barriers to discharge/monitor patient progress toward functional and medical goals  Care Tool:  Bathing    Body parts bathed by patient: Chest, Abdomen, Face, Left upper leg, Right upper leg, Left arm   Body parts bathed by helper: Right arm, Front perineal area, Buttocks, Right lower leg, Left lower leg     Bathing assist Assist Level: Moderate Assistance - Patient 50 - 74%     Upper Body Dressing/Undressing Upper body dressing   What is the patient wearing?: Pull over shirt    Upper body assist Assist Level: Maximal Assistance - Patient 25 - 49%    Lower Body Dressing/Undressing Lower body dressing      What is the patient wearing?: Incontinence brief, Pants     Lower body assist Assist for lower body dressing: Maximal Assistance - Patient 25 - 49%     Toileting Toileting    Toileting assist Assist for toileting: Moderate Assistance - Patient 50 - 74%     Transfers Chair/bed transfer  Transfers assist  Chair/bed transfer activity did not occur: Safety/medical concerns  Chair/bed transfer assist level: Contact Guard/Touching assist     Locomotion Ambulation  Ambulation assist   Ambulation activity did not occur: Safety/medical concerns (dizziness and mild diaphoresis on standing)  Assist level: Contact Guard/Touching assist Assistive device: Walker-rolling Max distance: 60 ft   Walk 10 feet activity   Assist  Walk 10 feet activity did not occur: Safety/medical concerns  Assist level: Contact Guard/Touching assist Assistive device: Walker-rolling   Walk 50 feet activity   Assist Walk 50 feet with 2 turns activity did not occur: Safety/medical concerns  Assist level: Minimal Assistance - Patient > 75% Assistive device: Walker-rolling    Walk 150 feet activity   Assist Walk 150 feet activity did not occur: Safety/medical concerns         Walk 10 feet on uneven surface   activity   Assist Walk 10 feet on uneven surfaces activity did not occur: Safety/medical concerns         Wheelchair     Assist Is the patient using a wheelchair?: Yes Type of Wheelchair: Manual    Wheelchair assist level: Dependent - Patient 0% Max wheelchair distance: 35 ft    Wheelchair 50 feet with 2 turns activity    Assist        Assist Level: Dependent - Patient 0%   Wheelchair 150 feet activity     Assist      Assist Level: Dependent - Patient 0%   Blood pressure 121/60, pulse 71, temperature 98.6 F (37 C), temperature source Oral, resp. rate 18, weight 81 kg, SpO2 100 %.    Medical Problem List and Plan: 1. 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  Antithrombotics: -DVT/anticoagulation: If atrial fibrillation recurs, may need to consider AC             -antiplatelet therapy: aspirin 81 mg daily             SCDs and TEDs; consider starting Lovenox 3. Pain Management: Tylenol as needed 4. Mood/Behavior/Sleep: LCSW to evaluate and provide emotional support Discussed anxiety and depression with pt and wife, no prior hx or prior meds, will start low dose celexa, neuropsych eval may be limited due to aphasia              -antipsychotic agents: n/a 5. Neuropsych/cognition: This patient is not capable of making decisions on his own behalf. 6. Skin/Wound Care: Routine skin care checks  7. Fluids/Electrolytes/Nutrition: Strict Is and Os and follow-up chemistries             -daily weight             -continue dysphagia 2 diet/thins; SLP eval             -continue feeding supplement  8: Heart failure with pEF (see meds below)             -monitor Mg and  K             -daily weight (weight range 65kg)             -follow-up with Dr. Haroldine Laws              - uptrending, appearance euvolemic,  labs with mild Cr increase but otherwise stable.   11/25: Stable  Filed Weights   10/09/22 0455 10/10/22 0335 10/11/22 0539  Weight: 84.5 kg 79.2 kg 81 kg     9: Leukocytosis/leukemoid reaction: resolved  -completed Zosyn, starting Augmentin BID for 10 days thru 12/1                 Latest Ref Rng & Units 10/10/2022    5:13 AM 10/07/2022    4:48 AM 10/05/2022    6:18 AM  CBC  WBC 4.0 - 10.5 K/uL 10.4  15.1  13.9   Hemoglobin 13.0 - 17.0 g/dL 9.0  8.7  8.3   Hematocrit 39.0 - 52.0 % 29.3  27.6  25.9   Platelets 150 - 400 K/uL 247  378  310      10: Post-op atrial fibrillation:  -continue amiodarone 200 mg BID for 7 days then 200 mg daily             -if recurs, consider AC  - HR well controlled; monitor    10/11/2022    7:50 AM 10/11/2022    7:41 AM 10/11/2022    5:39 AM  Vitals with BMI  Weight   178 lbs 9 oz  BMI   123XX123  Systolic [id number]  Diastolic 60 60 54  Pulse 71  68     11: Left renal artery stenosis: follow-up as outpatient 12: Acute blood loss anemia: follow-up CBC  - stable 13: Hyperlipidemia: continue Lipitor 40 mg daily 14: GERD/HH: no meds (Pepcid discontinued) 15: Hypertension: montior TID and prn             -continue Lopressor 12.5 mg BID             -restart losartan 50 mg daily, Spironolactone 12.5 mg as BP allows  Controlled as above 11/27  16: Acute urinary retention:             -has foley cath - Dced on admission, sarting PVRs Q6H for voiding trial             -continue Flomax 0.4 mg daily -> 0.8 mg 11/23            -failed voiding trial x 1   17: Acute kidney injury: serum creatinine improving; follow-up BMP    Latest Ref Rng & Units 10/10/2022    5:13 AM 10/07/2022    4:48 AM 10/05/2022    6:18 AM  BMP  Glucose 70 - 99 mg/dL 110  113  120   BUN 8 - 23 mg/dL 17  18  18    Creatinine 0.61  - 1.24 mg/dL 1.13  1.22  1.08   Sodium 135 - 145 mmol/L 137  141  137   Potassium 3.5 - 5.1 mmol/L 4.0  3.8  3.9   Chloride 98 - 111 mmol/L 106  103  107   CO2 22 - 32 mmol/L 23  24  23    Calcium 8.9 - 10.3 mg/dL 8.2  8.7  8.1      18. Agitation/confusion/Negative mood- per family- didn't see myself- might need Seroquel vs Depakote to help with calming pt down - no issues - No issues overnight  19. Constipation           - LBM 11/24            - Added Sennakot-S 1 tab QHS  LOS: 7 days A FACE TO FACE EVALUATION WAS PERFORMED  Mitzi Hansen  E Jermany Sundell 10/11/2022, 8:37 AM

## 2022-10-11 NOTE — Discharge Summary (Signed)
Physician Discharge Summary  Patient ID: CODI KERTZ MRN: [id number] DOB/AGE: Mar 20, 1941 81 y.o.  Admit date: 10/04/2022 Discharge date: 10/20/2022  Discharge Diagnoses:  Principal Problem:   CVA (cerebral vascular accident) [facility]) Functional deficits secondary to right brain strokes Dysphagia Heart failure with preserved ejection fraction Leukocytosis/leukemoid reaction Atrial fibrillation Left renal artery stenosis Acute blood loss anemia Hyperlipidemia GERD Hiatal hernia Hypertension Acute urinary retention Acute kidney injury Agitation/confusion/negative mood   Discharged Condition: good  Labs:  Basic Metabolic Panel: Recent Labs  Lab 10/17/22 0759  NA 138  K 3.9  CL 106  CO2 24  GLUCOSE 124*  BUN 15  CREATININE 1.13  CALCIUM 8.4*    CBC: Recent Labs  Lab 10/17/22 0759  WBC 9.0  HGB 10.1*  HCT 31.8*  MCV 88.1  PLT 140*    Brief HPI:   James Estrada is a 81 y.o. male, left- handed, who presented from home to the emergency department via EMS on 09/17/2022 with acute onset of bilateral leg weakness and left upper back pain.  The patient's medical history is significant for multiple joint replacements and abdominal aortic aneurysm status post stent graft placement July, 2021.  Neurology was consulted.  Stat CT examination of the head revealed no acute abnormality.  CT dissection protocol revealed acute aortic dissection with retroperitoneal blood adjacent to the left psoas muscle.  Emergent aortic dissection repair with effort to spare the artery supplying the spinal cord emergently necessary and the patient was admitted by Dr. Roxan Hockey.  The patient was counseled regarding emergent need of repair of type I dissection.  He underwent placement of 30 mm Hemashield graft by Dr. Roxan Hockey on 11/5 and was transferred to the ICU.  Rapid wean protocol initiated.  Hypertension and hypokalemia addressed.  Elevated serum creatinine consistent with acute kidney  injury.  No Lovenox given secondary to thrombocytopenia.  SCDs for DVT prophylaxis.  Advanced heart failure team consulted 11/6.  Remained on Cardene infusion for blood pressure control.  Aspirin and beta-blocker ordered.  Lasix infusion for diuresis.  Postoperative paroxysmal atrial fibrillation noted and placed on amiodarone infusion.  Neurology continued to follow the patient due to extremity weakness.  CT head performed 11/6 revealed 3 small right-sided strokes 1 in the cerebellum and 1 in the right basal ganglia as well as 1 in the frontal lobe with a 4 mm area of bleeding.  CTA without large vessel occlusion.  He exhibited left-sided weakness.  EEG performed.  Hyperglycemia treated and resolved.  Tube feedings initiated 11/7. Exhibited some agitation and was left intubated.  PCCM consulted on 11/8 for assistance with ventilator weaning.  He was extubated on 11/9.  Lopressor started for blood pressure control.  MRI brain performed 11/11 which showed numerous bilateral foci of acute ischemia right greater than left and MCA and PCA territories as well as the cerebellum.  Neurology continued to follow the patient and recommends aspirin 81 mg daily and if atrial fibrillation recurs to consider anticoagulation.  No statin needed given that LDL is at goal.  Diuretics held 11/12.  He remained in normal sinus rhythm on amiodarone.  AKI likely secondary to ATN from hemorrhagic shock/hypotension continued to improve.  Blood pressure slowly climbed and permissive hypertension allowed until 11/14.  Losartan 25 mg daily then added.  Later that day, he went into atrial fibrillation with rapid ventricular response and oral amiodarone was discontinued and bolused and started on amiodarone infusion.  He converted back into sinus rhythm.  Core track  tube placed.  Hemoglobin remained stable.  White blood cell count trended upward and urinalysis was negative.  Chest x-ray with bibasilar atelectasis versus infiltrate.  Treated  with vancomycin and Zosyn.  Was found to have urinary retention and a Foley catheter was placed.  He experienced diarrhea and C. difficile toxin was negative.  Metoprolol continued 12.5 mg BID. Losartan and spironolactone started and discontinued. Modified barium swallow completed and p.o. intake initiated.  He is currently tolerating dysphagia 2 diet with thin liquids.  Surgical incisions are healing without signs of infection.  He is now on amiodarone 200 mg orally twice daily.  He will transition to Augmentin for 10 days.   Hospital Course: KYSER WANDEL was admitted to rehab 10/04/2022 for inpatient therapies to consist of PT, ST and OT at least three hours five days a week. Past admission physiatrist, therapy team and rehab RN have worked together to provide customized collaborative inpatient rehab.  CBC reveals downtrending leukocytosis.  Follow-up BMP revealed normalization of BUN and creatinine.  Catheter was discontinued and routine PVRs every 6 hours checked.  Flomax 0.4 mg continued.  Agitation, confusion, negative mood noticed and reported by family but not witnessed by provider.  Bladder scans increased to every 4 hours on 11/23 to monitor postvoid residual.  PVRs remained high therefore Flomax increased to 0.8 mg.  Required reinsertion of Foley catheter 11/24.  Leukocytosis resolved and the patient completed his Zosyn therapy.  Augmentin was started twice daily for 10 days through 12/1.  Asked anxiety and depression with the patient and his wife denied a prior history of this or on prior medications.  Celexa 10 mg daily started 11/28. Unable to crush Flomax so discontinued on 11/29 and Cardura 1 mg at bedtime started. Hemoglobin remained stable at 10.1. WBC down to 9.0 on 12/4.  Flu vaccine administered. Foley changed out on day of discharge 12/7. HH arranged for RN to replaced in 3 weeks. Has follow-up with urology in January. No recurrence of a fib.  His home Losartan and spironolcatone were  NOT restarted.   Blood pressures were monitored on TID basis and Lopressor 12.5 twice daily and amiodarone 200 mg twice daily for 7 days continued; once daily dosing started on 11/29.  Rehab course: During patient's stay in rehab weekly team conferences were held to monitor patient's progress, set goals and discuss barriers to discharge. At admission, patient required max assist with basic self-care skills and mobility.  He has had improvement in activity tolerance, balance, postural control as well as ability to compensate for deficits. He has had improvement in functional use RUE/LUE  and RLE/LLE as well as improvement in awareness. One LTG deemed adequate for d/c. Pt's dynamic standing balance is usually CGA with minimal, intermittent need for MinA to maintain balance. Family has been trained for CGA and rare, intermittent instance of LOB.   SLP 12/6:  Patient has met 6 of 6 long term goals.  Patient to discharge at overall Min;Mod level.   Discharge disposition: 01-Home or Self Care     Diet: Heart healthy  Special Instructions: No driving, alcohol consumption or tobacco use.  30-35 minutes were spent on discharge planning and discharge summary.  Discharge Instructions     Ambulatory referral to Neurology   Complete by: As directed    An appointment is requested in approximately: 4 weeks   Ambulatory referral to Physical Medicine Rehab   Complete by: As directed    Hospital follow-up   Discharge patient  Complete by: As directed    Discharge disposition: 01-Home or Self Care   Discharge patient date: 10/20/2022      Allergies as of 10/20/2022   No Known Allergies      Medication List     STOP taking these medications    amoxicillin-clavulanate 875-125 MG tablet Commonly known as: AUGMENTIN   tamsulosin 0.4 MG Caps capsule Commonly known as: FLOMAX   traMADol 50 MG tablet Commonly known as: ULTRAM       TAKE these medications    acetaminophen 325 MG  tablet Commonly known as: TYLENOL Take 1-2 tablets (325-650 mg total) by mouth every 4 (four) hours as needed for mild pain.   amiodarone 200 MG tablet Commonly known as: PACERONE Take 1 tablet (200 mg total) by mouth daily. What changed:  when to take this additional instructions   Aspirin Low Dose 81 MG chewable tablet Generic drug: aspirin Chew 1 tablet (81 mg total) by mouth daily.   atorvastatin 40 MG tablet Commonly known as: LIPITOR Take 1 tablet (40 mg total) by mouth daily.   citalopram 10 MG tablet Commonly known as: CELEXA Take 1 tablet (10 mg total) by mouth daily.   doxazosin 1 MG tablet Commonly known as: CARDURA Take 1 tablet (1 mg total) by mouth at bedtime.   feeding supplement Liqd Take 237 mLs by mouth 2 (two) times daily between meals.   metoprolol tartrate 25 MG tablet Commonly known as: LOPRESSOR Take 0.5 tablets (12.5 mg total) by mouth 2 (two) times daily.   Senexon-S 8.6-50 MG tablet Generic drug: senna-docusate Take 1 tablet by mouth at bedtime as needed for mild constipation.        Follow-up Information     Kirsteins, Andrew E, MD Follow up.   Specialty: Physical Medicine and Rehabilitation Why: office will call you to arrange your appt (sent) Contact information: [address] [place] [address] [phone]         [facility] Follow up.   Why: Call the office in 1 to 2 days to make arrangements for hospital follow-up appointment. Contact information: [address]     [address] [place] [address] [phone]        Bensimhon, Daniel R, MD Follow up.   Specialty: Cardiology Why: Call the office in 1 to 2 days to make arrangements for hospital follow-up appointment. Contact information: [address] [place] [address] [phone]         Polite, Ronald, MD Follow up.   Specialty: Internal Medicine Why: Call the office in 1 to 2 days  to make arrangements for hospital follow-up appointment. Contact information: [address] [place] [address] [phone]         Hendrickson, Steven C, MD. Go to.   Specialty: Cardiothoracic Surgery Contact information: [address] [place] [address] [phone]         Herrick, Benjamin W, MD. Go to.   Specialty: Urology Why: 11/29/2022 Contact information: [address] [place] [address] [phone]                 Signed:  J  10/20/2022, 3:45 PM  

## 2022-10-11 NOTE — Progress Notes (Signed)
Speech Language Pathology Daily Session Note  Patient Details  Name: James Estrada MRN: [id number] Date of Birth: 05-11-1941  Today's Date: 10/11/2022 SLP Individual Time: 0800-0900 SLP Individual Time Calculation (min): 60 min  Short Term Goals: Week 1: SLP Short Term Goal 1 (Week 1): Pt will answer basic and semi-complex yes/no questions with 90% accuracy given Min A. SLP Short Term Goal 2 (Week 1): Pt will follow one-step commands on 80% of trials given Mod A multinodal cues. SLP Short Term Goal 3 (Week 1): Pt will match object or picture to word with 90% accuracy given Min A. SLP Short Term Goal 4 (Week 1): Pt will participate in responsive naming tasks with 90% given Mod A multimodal cues. SLP Short Term Goal 5 (Week 1): Pt will effectively communicate wants, needs, and ideas for ~80% of session with Mod A. SLP Short Term Goal 6 (Week 1): Pt will participate in therapeutic trials of Dysphagia 3 textures and thin liquids with minimal s/sx concerning for aspiration with Min A.  Skilled Therapeutic Interventions: Skilled ST treatment focused on communication goals. Pt was accompanied by spouse on arrival and had just completed morning meal. Pt/spouse both pleased with diet advancement. Spouse denied any observations of chewing/swallowing difficulty with advanced textures. Plan to continue current diet at this time. Pt transferred to wheelchair with min-to-mod A verbal cues for following step-by-step commands. Pt then completed oral care at sink in wheelchair with mod-to-max A verbal/visual/tactile cues for set-up and sequencing. SLP facilitated object naming with 75% accuracy given max fading to min A verbal cues. Pt verbally read at the word level with 80% accuracy given mod fading to min A verbal cues. Pt known to frequently perseverate on previous stimuli. SLP attempted to have pt match written words to object given field of 2 objects however pt exhibited breakdown with understanding task  instructions requiring total A multimodal cues therefore SLP needed to break down task step-by-step (1. Name object, 2. Verbally read word). Pt continues to exhibit circumlocution, word finding difficulty, and semantic paraphasias impacting verbal fluency and effectiveness of verbal communication, however mild improvement noted with accuracy of yes/no questions, understanding basic questions, following simple commands, and improved verbal output at the sentence level within natural contexts. Patient was left in wheelchair with alarm activated and immediate needs within reach at end of session. Continue per current plan of care.       Pain Pain Assessment Pain Scale: 0-10 Pain Score: 0-No pain Faces Pain Scale: No hurt  Therapy/Group: Individual Therapy  Tamala Ser 10/11/2022, 10:54 AM

## 2022-10-12 DIAGNOSIS — I63031 Cerebral infarction due to thrombosis of right carotid artery: Secondary | ICD-10-CM | POA: Diagnosis not present

## 2022-10-12 MED ORDER — TAMSULOSIN HCL 0.4 MG PO CAPS: 0.4000 mg | ORAL_CAPSULE | Freq: Every day | ORAL | Status: DC

## 2022-10-12 MED ORDER — DOXAZOSIN MESYLATE 1 MG PO TABS
1.0000 mg | ORAL_TABLET | Freq: Every day | ORAL | Status: DC
  Administered 2022-10-12 – 2022-10-19 (×8): 1 mg via ORAL
  Filled 2022-10-12 (×9): qty 1

## 2022-10-12 NOTE — Progress Notes (Signed)
Speech Language Pathology Weekly Progress and Session Note  Patient Details  Name: James Estrada MRN: [id number] Date of Birth: 25-Apr-1941  Beginning of progress report period: October 05, 2022 End of progress report period: October 12, 2022  Today's Date: 10/12/2022 SLP Individual Time: 0803-0900 SLP Individual Time Calculation (min): 57 min  Short Term Goals: Week 1: SLP Short Term Goal 1 (Week 1): Pt will answer basic and semi-complex yes/no questions with 90% accuracy given Min A. SLP Short Term Goal 1 - Progress (Week 1): Not met SLP Short Term Goal 2 (Week 1): Pt will follow one-step commands on 80% of trials given Mod A multinodal cues. SLP Short Term Goal 2 - Progress (Week 1): Met SLP Short Term Goal 3 (Week 1): Pt will match object or picture to word with 90% accuracy given Min A. SLP Short Term Goal 3 - Progress (Week 1): Not met SLP Short Term Goal 4 (Week 1): Pt will participate in responsive naming tasks with 90% given Mod A multimodal cues. SLP Short Term Goal 4 - Progress (Week 1): Met SLP Short Term Goal 5 (Week 1): Pt will effectively communicate wants, needs, and ideas for ~80% of session with Mod A. SLP Short Term Goal 5 - Progress (Week 1): Met SLP Short Term Goal 6 (Week 1): Pt will participate in therapeutic trials of Dysphagia 3 textures and thin liquids with minimal s/sx concerning for aspiration with Min A. SLP Short Term Goal 6 - Progress (Week 1): Met  New Short Term Goals: Week 2: SLP Short Term Goal 1 (Week 2): STG=LTG due to ELOS  Weekly Progress Updates: Patient has made functional gains and has met 4 of 6 STGs this reporting period. Patient is currently requiring min-to-mod A cues to complete basic functional expressive/receptive language tasks. Pt continues to be limited by non-fluent aphasia impacting verbal expression and comprehension, as well as oral apraxia and motor planning deficits. Pt demonstrating improving word finding, naming, reading,  and following simple commands. Pt demonstrates improved oral swallow function as evidenced by ability to advance to dysphagia 3 textures with thin liquids. Plan to complete trials of whole medications in coordination with nursing. Patient and family education is ongoing. Patient would benefit from continued skilled SLP intervention to maximize cognitive functioning and overall functional independence prior to discharge.   Intensity: Minumum of 1-2 x/day, 30 to 90 minutes Frequency: 3 to 5 out of 7 days Duration/Length of Stay: 12/07 Treatment/Interventions: Functional tasks;Patient/family education;Speech/Language facilitation;Dysphagia/aspiration precaution training  Daily Session Skilled Therapeutic Interventions: Skilled ST treatment focused on language and swallowing goals. Pt was greeted in bed with spouse at bedside providing intermittent feeding assist to initiate. Pt consumed dysphagia 3 textures with timely and effective mastication, minimal oral residuals, and no overt s/sx of aspiration with current textures and thin liquids through straw. Pt required sup A verbal cues for implementation of small bite sizes. Sup A feeding assist needed to initiate. Nurse present for med pass. Pt currently receiving crushed meds. Discussed trial of whole pills with water or puree at some point today. Pt/spouse/nurse in agreement.   Pt transferred from bed to wheelchair with overall min A verbal and occasional tactile and/or visual cues to execute with additional time. SLP facilitated a divergent naming task with overall mod A verbal cues (limited field of choices, semantic cues, phonemic cues, sentence completion) to generate words within concrete categories, generating food choices from menus, discussing preferred/non-preferred food items. Pt responded to concrete yes/no questions regarding preferences with overall  min A verbal cues to achieve 75% accuracy. Pt continues to exhibit circumlocution with vague  speech however improving content and verbal fluency for overall improved communication effectiveness. Pt also demonstrating improved awareness to speech errors and increasing ability to repair communication breakdowns. Pt verbally read signs within atrium area with sup A verbal cues.   Patient was left in wheelchair with mod A verbal cues to understand to stay in wheelchair until next therapy session. Pt was left with alarm activated and immediate needs within reach at end of session. Continue per current plan of care.       General    Pain  None/denied  Therapy/Group: Individual Therapy  Patty Sermons 10/12/2022, 12:45 PM

## 2022-10-12 NOTE — Progress Notes (Addendum)
PROGRESS NOTE   Subjective/Complaints:  Seen in OT, pt without c/os  ROS: Limited d/t aphasia  Objective:   No results found. Recent Labs    10/10/22 0513  WBC 10.4  HGB 9.0*  HCT 29.3*  PLT 247    Recent Labs    10/10/22 0513  NA 137  K 4.0  CL 106  CO2 23  GLUCOSE 110*  BUN 17  CREATININE 1.13  CALCIUM 8.2*     Intake/Output Summary (Last 24 hours) at 10/12/2022 1004 Last data filed at 10/12/2022 0900 Gross per 24 hour  Intake 720 ml  Output 3900 ml  Net -3180 ml         Physical Exam: Vital Signs Blood pressure 125/68, pulse 61, temperature 98.8 F (37.1 C), resp. rate 15, weight 81.4 kg, SpO2 95 %. Physical Exam   General: No acute distress Mood and affect are appropriate Heart: Regular rate and rhythm no rubs murmurs or extra sounds Lungs: Clear to auscultation, breathing unlabored, no rales or wheezes Abdomen: Positive bowel sounds, soft nontender to palpation, nondistended Extremities: No clubbing, cyanosis, or edema Skin: No evidence of breakdown, no evidence of rash  Musculoskeletal:  Antigravity and against resistance all 4 extremities; L>R weakness.   Skin:    General: Skin is warm and dry. Mild ecchymosis on nose - resolving  Neurological:     Mental Status:  Oriented to self only.  Language: Fluent but with significant receptive and expressive aphasia. perseveration -  Can follow simple commands 2/3. +apraxia Motor planning difficulty.   CN exam: + L facial droop. + L face sensory deficit.  Psychiatric:     Comments: Appropriate mood and affect.        Assessment/Plan: 1. Functional deficits which require 3+ hours per day of interdisciplinary therapy in a comprehensive inpatient rehab setting. Physiatrist is providing close team supervision and 24 hour management of active medical problems listed below. Physiatrist and rehab team continue to assess barriers to  discharge/monitor patient progress toward functional and medical goals  Care Tool:  Bathing    Body parts bathed by patient: Chest, Abdomen, Face, Left upper leg, Right upper leg, Left arm   Body parts bathed by helper: Right arm, Front perineal area, Buttocks, Right lower leg, Left lower leg     Bathing assist Assist Level: Moderate Assistance - Patient 50 - 74%     Upper Body Dressing/Undressing Upper body dressing   What is the patient wearing?: Pull over shirt    Upper body assist Assist Level: Maximal Assistance - Patient 25 - 49%    Lower Body Dressing/Undressing Lower body dressing      What is the patient wearing?: Incontinence brief, Pants     Lower body assist Assist for lower body dressing: Maximal Assistance - Patient 25 - 49%     Toileting Toileting    Toileting assist Assist for toileting: Moderate Assistance - Patient 50 - 74%     Transfers Chair/bed transfer  Transfers assist  Chair/bed transfer activity did not occur: Safety/medical concerns  Chair/bed transfer assist level: Contact Guard/Touching assist     Locomotion Ambulation   Ambulation assist   Ambulation activity  did not occur: Safety/medical concerns (dizziness and mild diaphoresis on standing)  Assist level: Contact Guard/Touching assist Assistive device: Walker-rolling Max distance: 60 ft   Walk 10 feet activity   Assist  Walk 10 feet activity did not occur: Safety/medical concerns  Assist level: Contact Guard/Touching assist Assistive device: Walker-rolling   Walk 50 feet activity   Assist Walk 50 feet with 2 turns activity did not occur: Safety/medical concerns  Assist level: Minimal Assistance - Patient > 75% Assistive device: Walker-rolling    Walk 150 feet activity   Assist Walk 150 feet activity did not occur: Safety/medical concerns         Walk 10 feet on uneven surface  activity   Assist Walk 10 feet on uneven surfaces activity did not occur:  Safety/medical concerns         Wheelchair     Assist Is the patient using a wheelchair?: Yes Type of Wheelchair: Manual    Wheelchair assist level: Dependent - Patient 0% Max wheelchair distance: 35 ft    Wheelchair 50 feet with 2 turns activity    Assist        Assist Level: Dependent - Patient 0%   Wheelchair 150 feet activity     Assist      Assist Level: Dependent - Patient 0%   Blood pressure 125/68, pulse 61, temperature 98.8 F (37.1 C), resp. rate 15, weight 81.4 kg, SpO2 95 %.    Medical Problem List and Plan: 1. 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  Antithrombotics: -DVT/anticoagulation: If atrial fibrillation recurs, may need to consider AC             -antiplatelet therapy: aspirin 81 mg daily             SCDs and TEDs; consider starting Lovenox 3. Pain Management: Tylenol as needed 4. Mood/Behavior/Sleep: LCSW to evaluate and provide emotional support Discussed anxiety and depression with pt and wife, no prior hx or prior meds, will start low dose celexa, neuropsych eval may be limited due to aphasia              -antipsychotic agents: n/a 5. Neuropsych/cognition: This patient is not capable of making decisions on his own behalf. 6. Skin/Wound Care: Routine skin care checks  7. Fluids/Electrolytes/Nutrition: Strict Is and Os and follow-up chemistries             -daily weight             -continue dysphagia 2 diet/thins; SLP eval             -continue feeding supplement  8: Heart failure with pEF (see meds below)             -monitor Mg and K             -daily weight (weight range 65kg)             -follow-up with Dr. Haroldine Laws               - uptrending, appearance euvolemic, labs with mild Cr increase but otherwise stable.  11/25: Stable  Filed Weights   10/10/22 0335 10/11/22 0539 10/12/22 0500  Weight: 79.2 kg 81 kg 81.4 kg     9: Leukocytosis/leukemoid reaction: resolved  -completed Zosyn, starting Augmentin BID for 10 days thru 12/1                 Latest Ref Rng & Units 10/10/2022    5:13 AM 10/07/2022    4:48 AM 10/05/2022    6:18 AM  CBC  WBC 4.0 - 10.5 K/uL 10.4  15.1  13.9   Hemoglobin 13.0 - 17.0 g/dL 9.0  8.7  8.3   Hematocrit 39.0 - 52.0 % 29.3  27.6  25.9   Platelets 150 - 400 K/uL 247  378  310      10: Post-op atrial fibrillation:  -continue amiodarone 200 mg BID for 7 days then 200 mg daily             -if recurs, consider AC  - HR well controlled; monitor    10/12/2022    5:00 AM 10/12/2022    2:42 AM 10/11/2022    7:33 PM  Vitals with BMI  Weight 179 lbs 7 oz    BMI 0000000    Systolic  0000000 [id number]  Diastolic  68 65  Pulse  61 71     11: Left renal artery stenosis: follow-up as outpatient 12: Acute blood loss anemia: follow-up CBC  - stable 13: Hyperlipidemia: continue Lipitor 40 mg daily 14: GERD/HH: no meds (Pepcid discontinued) 15: Hypertension: montior TID and prn             -continue Lopressor 12.5 mg BID             -restart losartan 50 mg daily, Spironolactone 12.5 mg as BP allows  Controlled as above 11/27  16: Acute urinary retention:             -has foley cath - Dced on admission, sarting PVRs Q6H for voiding trial             -continue Flomax 0.4 mg daily -            -failed voiding trial x 1 , home with foley   17: Acute kidney injury: serum creatinine improving; follow-up BMP    Latest Ref Rng & Units 10/10/2022    5:13 AM 10/07/2022    4:48 AM 10/05/2022    6:18 AM  BMP  Glucose 70 - 99 mg/dL 110  113  120   BUN 8 - 23 mg/dL 17  18  18    Creatinine 0.61 - 1.24 mg/dL 1.13  1.22  1.08   Sodium 135 - 145 mmol/L 137  141  137   Potassium 3.5 - 5.1 mmol/L  4.0  3.8  3.9   Chloride 98 - 111 mmol/L 106  103  107   CO2 22 - 32 mmol/L 23  24  23    Calcium 8.9 - 10.3 mg/dL 8.2  8.7  8.1     19. Constipation           - LBM 11/27            - Added Sennakot-S 1 tab QHS  LOS: 8 days A FACE TO FACE EVALUATION WAS PERFORMED  Charlett Blake 10/12/2022, 10:04 AM

## 2022-10-12 NOTE — Progress Notes (Signed)
Occupational Therapy Session Note  Patient Details  Name: James Estrada MRN: [id number] Date of Birth: 17-Oct-1941  Today's Date: 10/12/2022 OT Individual Time: 0906-1003 OT Individual Time Calculation (min): 57 min    Short Term Goals: Week 1:  OT Short Term Goal 1 (Week 1): Pt will demonstrate improved L side attention to wash LUE with min cues vs mod cues to attend to arm. OT Short Term Goal 2 (Week 1): Pt will demonstrate improved motor planning to don shirt with min A OT Short Term Goal 3 (Week 1): Pt will demonstrate improved postural control to hold static stand with min A or less. OT Short Term Goal 4 (Week 1): Pt will demonstrate improved L hand function to be able to wash under R arm and hold onto washcloth.  Skilled Therapeutic Interventions/Progress Updates:  Pt received seated in [facility] for skilled OT session with focus on R NMR/R-hand strengthening/coordination for means of extremity integration and improved independence with ADLs. Pt agreeable to interventions, demonstrating overall pleasant mood. Pt with no reports of pain, sharing his hopeful outlook on progression of functioning. OT offered intermediate rest breaks and positioning suggestions throughout session to address pain/fatigue and maximize participation/safety in session.   Pt dependent for WC transport to day room. In day room, pt completes 10 repetitions of lumbrical grasp using yellow resistive clip to strengthen L-hand. Pt benefiting from insight into purpose of activity and verbal cuing to engage all digits.   Pt then participates in sorting activity targeting pinch/tripof grasp for L-hand coordination and grip strength. Pt completes two trials of activity, requiring tactile demonstration of motions to initiate task and simple verbal cuing. Pt benefits from OT holding R-hand to successfully engage L-hand. OT scatters sorting objects across visual field during first trial, pt completing with verbal encouragement, and  solely on left-visual field (to encourage L-sided attention) on the second trial. Pt with improved attention to L-side, requiring Min VC for objects furthest to his L-side.   In standing with RUE supported on table-top + CGA, pt completes ~10-15 table-slides with LUE to promote active flexion/extension and abduction/adduction of L-shoulder, and overall WB through extremity. Pt completes repetitions using a visual target, and hand-over-hand assistance for motor planning, benefiting from out-loud countdown of repetitions to complete activity.  Pt remained seated in WC with all immediate needs meet and lap belt activated at end of session. Pt continues to be appropriate for skilled OT intervention to promote further functional independence.     Therapy Documentation Precautions:  Precautions Precautions: Sternal, Fall Precaution Comments: L inattention Restrictions Weight Bearing Restrictions: Yes Other Position/Activity Restrictions: sternal precautions - keep hands close to body with mobility    Therapy/Group: Individual Therapy  Lou Cal, OTR/L, MSOT   10/12/2022, 7:31 AM

## 2022-10-12 NOTE — Progress Notes (Signed)
Physical Therapy Weekly Progress Note  Patient Details  Name: James Estrada MRN: [id number] Date of Birth: 1941-02-13  Beginning of progress report period: October 05, 2022 End of progress report period: October 12, 2022  Today's Date: 10/12/2022 PT Individual Time: 2355-7322 and 0254-2706 PT Individual Time Calculation (min): 43 min and 43 min   Patient has met 5 of 5 short term goals.  Pt is making stedy progress toward LTG of GCA-supervision assist. Pt is currently functioning at a min-CGA level with RW for safety. Apraxia and aphasia is pt's greatest limiting factor, family has not been present for education to this point.   Patient continues to demonstrate the following deficits muscle weakness and muscle joint tightness, decreased cardiorespiratoy endurance, motor apraxia and decreased coordination, decreased visual perceptual skills and field cut, decreased attention to left, decreased attention, decreased awareness, decreased problem solving, decreased safety awareness, decreased memory, and delayed processing, and decreased sitting balance, decreased standing balance, decreased postural control, hemiplegia, and decreased balance strategies and therefore will continue to benefit from skilled PT intervention to increase functional independence with mobility.  Patient progressing toward long term goals..  Continue plan of care.  PT Short Term Goals Week 1:  PT Short Term Goal 1 (Week 1): Pt will perform bed mobility with improved initiation, understanding of vc and overall MinA. PT Short Term Goal 1 - Progress (Week 1): Met PT Short Term Goal 2 (Week 1): Pt will perform sit<>stand with overall Min/ModA while adhering to sternal precautions. PT Short Term Goal 2 - Progress (Week 1): Met PT Short Term Goal 3 (Week 1): Pt will perform stand pivot transfers with overall ModA requring <50% assist with manual weight shift facilitation. PT Short Term Goal 3 - Progress (Week 1): Met PT  Short Term Goal 4 (Week 1): Pt will initiate gait training. PT Short Term Goal 4 - Progress (Week 1): Met PT Short Term Goal 5 (Week 1): Pt will initiate step training. PT Short Term Goal 5 - Progress (Week 1): Met Week 2:  PT Short Term Goal 1 (Week 2): STG=LTG due to ELOS  Skilled Therapeutic Interventions/Progress Updates:   Pt received sitting in WC and agreeable to PT  Gait training 155f + 2x 752fwith RW with CGA-supervision assist. And dynamic gait training to step over bar weights on the FLoor with min assist for AD management. No apraxia limiting safety of movement with dynamic gait training.   Stair management training x 4 with BUE support and CGA. Performed anterior ascent and posterior descent due to apraxia preventing 180 deg turn at top of steps. Cues for UE position and safety in turn upon completion of stair management training.   Dynamic balance training with visual scanning to the L bean bag toss to target. Pt able to perform adequate visual scann to the L to locate all bean bags and utilize the LUE throughout to pick up and toss to target. Dropped 2 on floor, but ablew to pick up with LUE, but unable to maintain sternal precautions. Performed x 10 on BUE with increased time on the LUE, but noted to pick up 2 at a time with RUE.      Session 2.   Pt received sitting in WC and agreeable to PT  Gait training with RW [id number] CGA-supervision assist with cues for safety in turns. Visual scanning and object identification in hall. Pt required mod-max cues for visual scanning to the L throughout able to properly name 4 of 5  animals with partial identification of 5th; ei "antlers" for moose and "cock-a-doodle do" for roster. Performed with RW and CGA for safety for gait 4x60f.   Reciprocal foot taps on 8 inch step x 8 BLE, but pt demonstrated poor use of LLE on second bout performing only 1 of 8 attempts due to apraxia with fatigue min assist for safety throughout  [facility]  [facility] and all needs met.      Therapy Documentation Precautions:  Precautions Precautions: Sternal, Fall Precaution Comments: L inattention Restrictions Weight Bearing Restrictions: Yes Other Position/Activity Restrictions: sternal precautions - keep hands close to body with mobility    Pain: Denies is session 1 and 2    Therapy/Group: Individual Therapy  ALorie Phenix11/29/2023, 3:19 PM

## 2022-10-12 NOTE — Patient Care Conference (Signed)
Inpatient RehabilitationTeam Conference and Plan of Care Update Date: 10/12/2022   Time: 10:04 AM    Patient Name: James Estrada      Medical Record Number: [id number]  Date of Birth: 05/25/1941 Sex: Male         Room/Bed: 4W12C/4W12C-02 Payor Info: Payor: [employer] MEDICARE / Plan: BCBS MEDICARE / Product Type: *No Product type* /    Admit Date/Time:  10/04/2022  4:08 PM  Primary Diagnosis:  CVA (cerebral vascular accident) [facility])  Hospital Problems: Principal Problem:   CVA (cerebral vascular accident) [facility])    Expected Discharge Date: Expected Discharge Date: 10/20/22  Team Members Present: Physician leading conference: Dr. Claudette Laws Social Worker Present: Lavera Guise, BSW Nurse Present: Chana Bode, RN PT Present: Grier Rocher, PT OT Present: Primitivo Gauze, OT SLP Present: Eilene Ghazi, SLP PPS Coordinator present : Fae Pippin, SLP     Current Status/Progress Goal Weekly Team Focus  Bowel/Bladder   Incont of bowel. Foley cath that pt will be discharged with   Work on bowel cont.   Assist with toileting. Maintain peri and foley care.    Swallow/Nutrition/ Hydration   dys 3/thin liquids, min A   sup A  tolerance of current diet, regular PO trials as appropriate    ADL's   min - mod A overall with self care, progressing with LUE functional use   CGA  with standing, LB dressing, toileting;   min A bathing and shower transfers   ADL training, LUE NMR, visual attention and awareness of L side, balance, pt /fam education    Mobility   Orthostatic hypotension especially in mornings. Responds with rote answers and unable to complete original thoughts he initiates. Echolalia when provided with vc and attempting to process. Bed mobility = supervision, Transfers = supervision/ CGA for balance. Gait improving to longer distances when not light headed and with improving quality/ balance.   supervision/ CGA overall  L NMR, dynamic  standing balance, continued progression of transfer LOA, comprehension, L awareness and scan of visual field, initiate family education    Communication   mod-to-max A   min-to-mod A   functional communication through multimodal means, following 1-step commands, object ID, naming/word finding, education    Safety/Cognition/ Behavioral Observations               Pain   Denies pain   Continue to maintain no pain   Assess Qshift and PRN    Skin   Sternal and right groin incision. Sternum precautains   maintain skin integrity and continue to prevent skin breakdown  Assess Qshift and prn      Discharge Planning:  Discharging to daughters 1 level home with spouse. Daughter works and able to assist in home. Spouse potentially hiring [facility] assistance. Resources provided.   Team Discussion: Patient with apraxia, receptive and expressive aphasia and anxiety and motor planning deficits post CVA. Urinary retention, failed voiding trial; plan to discharge home w foley and OP urology follow up.   Patient on target to meet rehab goals: yes, currently needs mod assist for ADLs; needs help to attend to the left side. Needs min assist for transfers. Needs CGA for ambulation with a RW. Needs cues to maintain sternal precautions; pushes up from a chair.  Cognitive deficits limiting factor overall.  Needs supervision for feeding; D3 thin liquids.  Needs min assist for verbal reading, max assist for dual tasking. Overall goals for discharge set for CGA - supervision.   *  See Care Plan and progress notes for long and short-term goals.   Revisions to Treatment Plan:  Constraint induced therapy   Teaching Needs: Safety, foley care, medications, secondary risk management/dietary modifications, transfers, etc.  Current Barriers to Discharge: Decreased caregiver support and Neurogenic bowel and bladder  Possible Resolutions to Barriers: Family education DME : RW/W/C, TTB HH follow up services      Medical Summary Current Status: aphasic adn apraxic post op pain and low back pain , urinary retention  Barriers to Discharge: Behavior/Mood  Barriers to Discharge Comments: sternal prec s/p aortic repair Possible Resolutions to [employer] Focus: trial of celexa sternal prec x 6wk   Continued Need for Acute Rehabilitation Level of Care: The patient requires daily medical management by a physician with specialized training in physical medicine and rehabilitation for the following reasons: Direction of a multidisciplinary physical rehabilitation program to maximize functional independence : Yes Medical management of patient stability for increased activity during participation in an intensive rehabilitation regime.: Yes Analysis of laboratory values and/or radiology reports with any subsequent need for medication adjustment and/or medical intervention. : Yes   I attest that I was present, lead the team conference, and concur with the assessment and plan of the team.   Dorien Chihuahua B 10/12/2022, 1:25 PM

## 2022-10-12 NOTE — Progress Notes (Addendum)
Patient ID: James Estrada, male   DOB: [birth year], 81 y.o.   MRN: [id number]  Team Conference Report to Patient/Family  Team Conference discussion was reviewed with the patient and caregiver, including goals, any changes in plan of care and target discharge date.  Patient and caregiver express understanding and are in agreement.  The patient has a target discharge date of 10/20/22.  SW met with patient and discussed team conference updates. Sw called spouse, Benjamine Mola by telephone and she would prefer to meet in person this evening. Patient happy with discharge date. No additional questions or concerns.  3:45PM: Sw met with spouse and daughter in the room to provided team conference updates. Family requesting hospital bed and DME recommendations. Family attending education Friday 9-12  Dyanne Iha 10/12/2022, 1:17 PM

## 2022-10-13 DIAGNOSIS — I63031 Cerebral infarction due to thrombosis of right carotid artery: Secondary | ICD-10-CM | POA: Diagnosis not present

## 2022-10-13 NOTE — Progress Notes (Signed)
Occupational Therapy Weekly Progress Note  Patient Details  Name: James Estrada MRN: [id number] Date of Birth: 1941-02-09  Beginning of progress report period: October 05, 2022 End of progress report period: October 13, 2022  Today's Date: 10/13/2022 OT Individual Time: 1607-3710 OT Individual Time Calculation (min): 75 min    Patient has met 4 of 4 short term goals.  Pt has made excellent progress with his balance, L side attention, motor planning. He has progressed from a MAX A level with self care and mobility to a min/CGA level in a short period of time.  He continues to struggle with motor planning requiring him to still need mod cues with sequencing but now he is able to follow directions more easily.  His visual deficits are less pronounced when doing familiar tasks of b/d but has significant difficulty with new tasks that he is not familiar with such as puzzles or design matching.    Patient continues to demonstrate the following deficits: motor apraxia, decreased visual perceptual skills, decreased visual motor skills, and field cut, decreased attention to left, decreased awareness, decreased problem solving, and decreased memory, and decreased standing balance, hemiplegia, and decreased balance strategies and therefore will continue to benefit from skilled OT intervention to enhance overall performance with BADL.  Patient progressing toward long term goals..  Plan of care revisions: LTGs upgraded to Supervision with self care and CGA with transfers based on his strong progress.  OT Short Term Goals Week 1:  OT Short Term Goal 1 (Week 1): Pt will demonstrate improved L side attention to wash LUE with min cues vs mod cues to attend to arm. OT Short Term Goal 1 - Progress (Week 1): Met OT Short Term Goal 2 (Week 1): Pt will demonstrate improved motor planning to don shirt with min A OT Short Term Goal 2 - Progress (Week 1): Met OT Short Term Goal 3 (Week 1): Pt will demonstrate  improved postural control to hold static stand with min A or less. OT Short Term Goal 3 - Progress (Week 1): Met OT Short Term Goal 4 (Week 1): Pt will demonstrate improved L hand function to be able to wash under R arm and hold onto washcloth. OT Short Term Goal 4 - Progress (Week 1): Met Week 2:  OT Short Term Goal 1 (Week 2): STGs = LTGs (some goals have been upgraded)  Skilled Therapeutic Interventions/Progress Updates:    Pt seen this session for ADL training to include toileting, shower and dressing.  Pt used RW to ambulate to toilet. He was continent and then able to void bowel sitting on elevated toilet seat.  He then used RW to shower.  See ADL documentation below. Overall, he completed all self care at a CGA level with MOD verbal cues to fully attend to L side, sequence and avoid perseveration of movement. Cues to use LUE as much as possible.  Pt able to use B hands to wash hair, L hand to wash R arm, reached to feet and even tied his shoes with extra time.  He has most difficulty with turning to left when moving to a seat and needs manual guidance of shifting to his left.   After completing self care, pt worked on a visual perception task of finding matching colors of pins and placing them in a board following a card pattern. This was extremely difficult for him to follow directions, but he was able to use his L hand to pick up and release pins.  Reevaluated vision, he has good ocular ROM but does have increased difficulty with tracking to his left.  He has a partial L field visual cut. His LUE sh ROM is now full and he has full finger flexion with 4-/ 5 grasp and 75% of finger extension. This is great progress from evaluation. Pt resting in recliner with belt alarm on and all needs met.   Therapy Documentation Precautions:  Precautions Precautions: Sternal, Fall Precaution Comments: L inattention Restrictions Weight Bearing Restrictions: Yes Other Position/Activity Restrictions:  sternal precautions - keep hands close to body with mobility   Pain:  Pain Assessment Pain Score: 0-No pain ADL: ADL Eating: Set up Grooming: Setup Upper Body Bathing: Moderate cueing, Supervision/safety Where Assessed-Upper Body Bathing: Shower Lower Body Bathing: Moderate assistance, Contact guard Where Assessed-Lower Body Bathing: Shower Upper Body Dressing: Supervision/safety Where Assessed-Upper Body Dressing: Chair Lower Body Dressing: Minimal assistance Where Assessed-Lower Body Dressing: Chair Toileting: Contact guard Where Assessed-Toileting: Toilet Toilet Transfer: Contact guard, Moderate verbal cueing Toilet Transfer Method: Ambulating Toilet Transfer Equipment: Raised toilet seat Walk-In Shower Transfer: Minimal assistance, Moderate cueing Walk-In Shower Transfer Method: Ambulating Walk-In Shower Equipment: Grab bars, Shower seat with back   Therapy/Group: Individual Therapy  , 10/13/2022, 12:26 PM  

## 2022-10-13 NOTE — Progress Notes (Signed)
Speech Language Pathology Daily Session Note  Patient Details  Name: James Estrada MRN: [id number] Date of Birth: Mar 22, 1941  Today's Date: 10/13/2022 SLP Individual Time: 0800-0845 SLP Individual Time Calculation (min): 45 min  Short Term Goals: Week 2: SLP Short Term Goal 1 (Week 2): STG=LTG due to ELOS  Skilled Therapeutic Interventions: Skilled ST treatment focused on language goals. SLP facilitated session by completion of automatic speech sequences with 100% accuracy at independent level for counting 1-10, reciting the days of the week, and sentence completion with common sayings (e.g., it's raining cats and __), and "and" phrases (e.g., salt and __). SLP facilitated min A verbal cues for initiating the months of the year to achieve 80% accuracy progressing to 100% with phonemic and semantic cues. SLP facilitated sentence completion of "of" phrases (e.g., a bar of __") with mod fading to min A verbal cues due to more open ended and abstract nature vs. automatic response. Pt was pleased with performance and stated "it's still not 100% but better." Pt remains highly motivated during therapy and will be a great candidate for [facility] or OP therapies at discharge due to consistent progress, motivation, and family support. Patient was left in recliner with alarm activated and immediate needs within reach at end of session. Continue per current plan of care.      Pain  None/denied  Therapy/Group: Individual Therapy  Tamala Ser 10/13/2022, 8:48 AM

## 2022-10-13 NOTE — Progress Notes (Signed)
Physical Therapy Session Note  Patient Details  Name: James Estrada MRN: [id number] Date of Birth: Apr 07, 1941  Today's Date: 10/13/2022 PT Individual Time: 1343-1416 PT Individual Time Calculation (min): 33 min   Short Term Goals: Week 1:  PT Short Term Goal 1 (Week 1): Pt will perform bed mobility with improved initiation, understanding of vc and overall MinA. PT Short Term Goal 1 - Progress (Week 1): Met PT Short Term Goal 2 (Week 1): Pt will perform sit<>stand with overall Min/ModA while adhering to sternal precautions. PT Short Term Goal 2 - Progress (Week 1): Met PT Short Term Goal 3 (Week 1): Pt will perform stand pivot transfers with overall ModA requring <50% assist with manual weight shift facilitation. PT Short Term Goal 3 - Progress (Week 1): Met PT Short Term Goal 4 (Week 1): Pt will initiate gait training. PT Short Term Goal 4 - Progress (Week 1): Met PT Short Term Goal 5 (Week 1): Pt will initiate step training. PT Short Term Goal 5 - Progress (Week 1): Met Week 2:  PT Short Term Goal 1 (Week 2): STG=LTG due to ELOS  Skilled Therapeutic Interventions/Progress Updates:  Patient seated upright in recliner on entrance to room. Patient alert and agreeable to PT session.   Patient with no pain complaint at start of session.  Therapeutic Activity: Transfers: Pt performed sit<>stand and stand pivot transfers throughout session with supervision. Continues to reach for UE support upon initial stance. Provided verbal cues for technique and decreased need for UE support. Initial rise to stand with minimal dizziness. Returned to sit for brief rest. On second rise to stand, pt asymptomatic.   Gait Training:  Pt ambulated 150 ft x2 using RW with CGA/ supervision. Demonstrated continued ER with toe out on LLE advancement. Provided vc/ tc for level gaze and directionality.  Neuromuscular Re-ed: NMR facilitated during session with focus on locating items to L of pt during  ambulation. Pt guided with vc throughout and visual cues only with pt confusion with fatigue. Requires additional visual cues 50% of time.   Pt also guided in L visual field scan using 1# weighted bar and instructed to hit soft target held to pt's L side at various heights and distances while seated. Pt requires minimal vc only to turn head and locate target throughout.   NMR performed for improvements in motor control and coordination, balance, sequencing, judgement, and self confidence/ efficacy in performing all aspects of mobility at highest level of independence.   Patient seated in recliner at end of session with brakes locked, belt alarm set, and all needs within reach.   Therapy Documentation Precautions:  Precautions Precautions: Sternal, Fall Precaution Comments: L inattention Restrictions Weight Bearing Restrictions: Yes Other Position/Activity Restrictions: sternal precautions - keep hands close to body with mobility General:   Vital Signs:   Pain:  No pain complaint this session.   Therapy/Group: Individual Therapy  Alger Simons PT, DPT, CSRS 10/13/2022, 7:15 PM

## 2022-10-13 NOTE — Progress Notes (Signed)
PROGRESS NOTE   Subjective/Complaints:  Seen in OT, pt without c/os  ROS: Limited d/t aphasia  Objective:   No results found. No results for input(s): "WBC", "HGB", "HCT", "PLT" in the last 72 hours.  No results for input(s): "NA", "K", "CL", "CO2", "GLUCOSE", "BUN", "CREATININE", "CALCIUM" in the last 72 hours.   Intake/Output Summary (Last 24 hours) at 10/13/2022 0907 Last data filed at 10/13/2022 0830 Gross per 24 hour  Intake 680 ml  Output 2200 ml  Net -1520 ml         Physical Exam: Vital Signs Blood pressure 103/65, pulse 69, temperature 98.4 F (36.9 C), resp. rate 15, weight 84.1 kg, SpO2 94 %. Physical Exam   General: No acute distress Mood and affect are appropriate Heart: Regular rate and rhythm no rubs murmurs or extra sounds Lungs: Clear to auscultation, breathing unlabored, no rales or wheezes Abdomen: Positive bowel sounds, soft nontender to palpation, nondistended Extremities: No clubbing, cyanosis, or edema Skin: No evidence of breakdown, no evidence of rash  Musculoskeletal:  Antigravity and against resistance all 4 extremities; L>R weakness.   Skin:    General: Skin is warm and dry. Mild ecchymosis on nose - resolving  Neurological:     Mental Status:  Oriented to self only.  Language: Fluent but with significant receptive and expressive aphasia. perseveration -  Can follow simple commands 2/3. +apraxia 4/5 LUE, 5/5 RUE 5/5 RLE 4+ LLE   Speech fluent with anomia   Psychiatric:     Comments: Appropriate mood and affect.        Assessment/Plan: 1. Functional deficits which require 3+ hours per day of interdisciplinary therapy in a comprehensive inpatient rehab setting. Physiatrist is providing close team supervision and 24 hour management of active medical problems listed below. Physiatrist and rehab team continue to assess barriers to discharge/monitor patient progress toward  functional and medical goals  Care Tool:  Bathing    Body parts bathed by patient: Chest, Abdomen, Face, Left upper leg, Right upper leg, Left arm   Body parts bathed by helper: Right arm, Front perineal area, Buttocks, Right lower leg, Left lower leg     Bathing assist Assist Level: Moderate Assistance - Patient 50 - 74%     Upper Body Dressing/Undressing Upper body dressing   What is the patient wearing?: Pull over shirt    Upper body assist Assist Level: Maximal Assistance - Patient 25 - 49%    Lower Body Dressing/Undressing Lower body dressing      What is the patient wearing?: Incontinence brief, Pants     Lower body assist Assist for lower body dressing: Maximal Assistance - Patient 25 - 49%     Toileting Toileting    Toileting assist Assist for toileting: Moderate Assistance - Patient 50 - 74%     Transfers Chair/bed transfer  Transfers assist  Chair/bed transfer activity did not occur: Safety/medical concerns  Chair/bed transfer assist level: Contact Guard/Touching assist     Locomotion Ambulation   Ambulation assist   Ambulation activity did not occur: Safety/medical concerns (dizziness and mild diaphoresis on standing)  Assist level: Contact Guard/Touching assist Assistive device: Walker-rolling Max distance: 60 ft  Walk 10 feet activity   Assist  Walk 10 feet activity did not occur: Safety/medical concerns  Assist level: Contact Guard/Touching assist Assistive device: Walker-rolling   Walk 50 feet activity   Assist Walk 50 feet with 2 turns activity did not occur: Safety/medical concerns  Assist level: Minimal Assistance - Patient > 75% Assistive device: Walker-rolling    Walk 150 feet activity   Assist Walk 150 feet activity did not occur: Safety/medical concerns         Walk 10 feet on uneven surface  activity   Assist Walk 10 feet on uneven surfaces activity did not occur: Safety/medical concerns          Wheelchair     Assist Is the patient using a wheelchair?: Yes Type of Wheelchair: Manual    Wheelchair assist level: Dependent - Patient 0% Max wheelchair distance: 35 ft    Wheelchair 50 feet with 2 turns activity    Assist        Assist Level: Dependent - Patient 0%   Wheelchair 150 feet activity     Assist      Assist Level: Dependent - Patient 0%   Blood pressure 103/65, pulse 69, temperature 98.4 F (36.9 C), resp. rate 15, weight 84.1 kg, SpO2 94 %.    Medical Problem List and Plan: 1. 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  Antithrombotics: -DVT/anticoagulation: If atrial fibrillation recurs, may need to consider AC             -antiplatelet therapy: aspirin 81 mg daily             SCDs and TEDs; consider starting Lovenox 3. Pain Management: Tylenol as needed 4. Mood/Behavior/Sleep: LCSW to evaluate and provide emotional support Discussed anxiety and depression with pt and wife, no prior hx or prior meds, will start low dose celexa, neuropsych eval may be limited due to aphasia              -antipsychotic agents: n/a 5. Neuropsych/cognition: This patient is not capable of making decisions on his own behalf. 6. Skin/Wound Care: Routine skin care checks  7. Fluids/Electrolytes/Nutrition: Strict Is and Os and follow-up chemistries             -daily weight             -continue dysphagia 2 diet/thins; SLP eval             -continue feeding supplement  8: Heart failure with pEF (see meds below)             -monitor Mg and K             -daily weight (weight range 65kg)             -follow-up with Dr. Haroldine Laws              - uptrending, appearance euvolemic,  labs with mild Cr increase but otherwise stable.   11/25: Stable  Filed Weights   10/11/22 0539 10/12/22 0500 10/13/22 0500  Weight: 81 kg 81.4 kg 84.1 kg     9: Leukocytosis/leukemoid  reaction: resolved  -completed Zosyn, starting Augmentin BID for 10 days thru 12/1                 Latest Ref Rng & Units 10/10/2022    5:13 AM 10/07/2022    4:48 AM 10/05/2022    6:18 AM  CBC  WBC 4.0 - 10.5 K/uL 10.4  15.1  13.9   Hemoglobin 13.0 - 17.0 g/dL 9.0  8.7  8.3   Hematocrit 39.0 - 52.0 % 29.3  27.6  25.9   Platelets 150 - 400 K/uL 247  378  310      10: Post-op atrial fibrillation:  -continue amiodarone 200 mg BID for 7 days then 200 mg daily             -if recurs, consider AC  - HR well controlled; monitor    10/13/2022    5:00 AM 10/13/2022    4:27 AM 10/12/2022    7:27 PM  Vitals with BMI  Weight 185 lbs 7 oz    BMI 25.14    Systolic  103 128  Diastolic  65 72  Pulse  69 72     11: Left renal artery stenosis: follow-up as outpatient 12: Acute blood loss anemia: follow-up CBC  - stable 13: Hyperlipidemia: continue Lipitor 40 mg daily 14: GERD/HH: no meds (Pepcid discontinued) 15: Hypertension: montior TID and prn             -continue Lopressor 12.5 mg BID             -restart losartan 50 mg daily, Spironolactone 12.5 mg as BP allows   Vitals:   10/12/22 1927 10/13/22 0427  BP: 128/72 103/65  Pulse: 72 69  Resp: 16 15  Temp: 98.2 F (36.8 C) 98.4 F (36.9 C)  SpO2: 97% 94%  controlled 11/30 16: Acute urinary retention:             -has foley cath - Dced on admission, sarting PVRs Q6H for voiding trial             -continue Flomax 0.4 mg daily -            -failed voiding trial x 1 , home with foley   17: Acute kidney injury: serum creatinine improving; follow-up BMP    Latest Ref Rng & Units 10/10/2022    5:13 AM 10/07/2022    4:48 AM 10/05/2022    6:18 AM  BMP  Glucose 70 - 99 mg/dL 811  914  782   BUN 8 - 23 mg/dL 17  18  18    Creatinine 0.61 -  1.24 mg/dL  9.56  2.13   Sodium 135 - 145 mmol/L 137  141  137   Potassium 3.5 - 5.1 mmol/L 4.0  3.8  3.9   Chloride 98 - 111 mmol/L 106  103  107   CO2 22 - 32 mmol/L 23  24  23    Calcium 8.9 - 10.3 mg/dL 8.2  8.7  8.1     19. Constipation           - LBM 11/27            - Added Sennakot-S 1 tab QHS  LOS: 9 days A FACE TO FACE EVALUATION WAS PERFORMED  10/13/2022, 9:07 AM

## 2022-10-13 NOTE — Progress Notes (Signed)
Physical Therapy Session Note  Patient Details  Name: James Estrada MRN: [id number] Date of Birth: 15-Aug-1941  Today's Date: 10/13/2022 PT Individual Time: 1115 - 1209 PT Individual Time Calculation (min): 54 min     Short Term Goals: Week 1:  PT Short Term Goal 1 (Week 1): Pt will perform bed mobility with improved initiation, understanding of vc and overall MinA. PT Short Term Goal 1 - Progress (Week 1): Met PT Short Term Goal 2 (Week 1): Pt will perform sit<>stand with overall Min/ModA while adhering to sternal precautions. PT Short Term Goal 2 - Progress (Week 1): Met PT Short Term Goal 3 (Week 1): Pt will perform stand pivot transfers with overall ModA requring <50% assist with manual weight shift facilitation. PT Short Term Goal 3 - Progress (Week 1): Met PT Short Term Goal 4 (Week 1): Pt will initiate gait training. PT Short Term Goal 4 - Progress (Week 1): Met PT Short Term Goal 5 (Week 1): Pt will initiate step training. PT Short Term Goal 5 - Progress (Week 1): Met  Skilled Therapeutic Interventions/Progress Updates:     Pt seated in recliner on entrance. Pt alert and agreeable for treatment session. Pt reports no pain complaint during therapy session.  Therapeutic Activity: Transfers: Pt complete sit<>stand with close supervision. Demonstrated stand pivot, and ambulatory transfers with close supervision. Requiring verbal cues for positioning to improve safety. Gait: Pt ambulated 1x250f, 1x150 ft with CGA to close supervision. Provided verbal and tactile cuing for maintaining erect posture and direction changes. Pt demonstrated step through gait pattern with decrease gait speed during direction changes and occasional downward head gaze during ambulation.  Neuromuscular Re-education: NMR was performed to improve motor control, coordination, balance, cognition, judgement, and confidence performed in all aspects of mobility at the highest level. Pt identified 8 bean bags  and was instructed to throw on corn hole board 10 ft (x4) away w/o using RW w/ close supervision. Pt then successfully identify bean bags by color while seated but struggled while standing requiring frequent verbal cues for correction. Pt performed seated catch and throw (3x8) sequence with inflatable die and was instructed to identify the number of dots after each throw. Pt required intermittent assistance with identifying number of dots on third attempt due to mental fatigue. Performed gait (x4) with turn around bowling pin or die positioned on chair 10 ft away using RW with CGA to close supervision. Pt was instructed to identify either the number of bowling pins or the number of dots on side of die before and after ambulating. Provided verbal cuing for identifying object and gait direction but Pt was able to identify number of pins/dots before and after ambulating while seated.  Pt mentions being frustrated with current condition. Provided pt education with encouragement on progress since admission. Pt shows continues to struggle with categorizing objects and performing task on command, increased with mental fatigue.  Pt ambulated back to room using RW with CGA to close supervision and performed ambulatory transfer to recliner. Pt wife was present and required assistance locating Pt phone. Once Pt phone was found, Pt was left seated in recliner with alarm belt on, call/bell present and all needs met.  Therapy Documentation Precautions:  Precautions Precautions: Sternal, Fall Precaution Comments: L inattention Restrictions Weight Bearing Restrictions: Yes Other Position/Activity Restrictions: sternal precautions - keep hands close to body with mobility    Pain: Pain Assessment Pain Score: 0-No pain  Therapy/Group: Individual Therapy EHilary HertzPT, SPT   Charda Janis  Noberto Retort 10/13/2022, 12:50 PM

## 2022-10-13 NOTE — Plan of Care (Signed)
  Problem: Sit to Stand Goal: LTG:  Patient will perform sit to stand in prep for activites of daily living with assistance level (OT) Description: LTG:  Patient will perform sit to stand in prep for activites of daily living with assistance level (OT) Flowsheets (Taken 10/13/2022 1027) LTG: PT will perform sit to stand in prep for activites of daily living with assistance level: (LTG upgraded due to progress with motor planning and L side awareness.) Supervision/Verbal cueing Note: LTG upgraded due to progress with motor planning and L side awareness.    Problem: RH Bathing Goal: LTG Patient will bathe all body parts with assist levels (OT) Description: LTG: Patient will bathe all body parts with assist levels (OT) Flowsheets (Taken 10/13/2022 1027) LTG: Pt will perform bathing with assistance level/cueing: (LTG upgraded due to progress with motor planning and L side awareness.) Supervision/Verbal cueing Note: LTG upgraded due to progress with motor planning and L side awareness.    Problem: RH Dressing Goal: LTG Patient will perform lower body dressing w/assist (OT) Description: LTG: Patient will perform lower body dressing with assist, with/without cues in positioning using equipment (OT) Flowsheets (Taken 10/13/2022 1027) LTG: Pt will perform lower body dressing with assistance level of: (LTG upgraded due to progress with motor planning and L side awareness.) Supervision/Verbal cueing Note: LTG upgraded due to progress with motor planning and L side awareness.    Problem: RH Toileting Goal: LTG Patient will perform toileting task (3/3 steps) with assistance level (OT) Description: LTG: Patient will perform toileting task (3/3 steps) with assistance level (OT)  Flowsheets (Taken 10/13/2022 1027) LTG: Pt will perform toileting task (3/3 steps) with assistance level: (LTG upgraded due to progress with motor planning and L side awareness.) Supervision/Verbal cueing Note: LTG upgraded due to  progress with motor planning and L side awareness.    Problem: RH Functional Use of Upper Extremity Goal: LTG Patient will use RT/LT upper extremity as a (OT) Description: LTG: Patient will use right/left upper extremity as a stabilizer/gross assist/diminished/nondominant/dominant level with assist, with/without cues during functional activity (OT) Flowsheets (Taken 10/13/2022 1027) LTG: Use of upper extremity in functional activities: (LTG upgraded due to progress with motor planning and L side awareness.) RUE as diminished level Note: LTG upgraded due to progress with motor planning and L side awareness.

## 2022-10-14 DIAGNOSIS — I63031 Cerebral infarction due to thrombosis of right carotid artery: Secondary | ICD-10-CM | POA: Diagnosis not present

## 2022-10-14 NOTE — Progress Notes (Signed)
Speech Language Pathology Daily Session Note  Patient Details  Name: RAMIREZ FULLBRIGHT MRN: [id number] Date of Birth: Jun 24, 1941  Today's Date: 10/14/2022 SLP Individual Time: 1118-1205 SLP Individual Time Calculation (min): 47 min  Short Term Goals: Week 2: SLP Short Term Goal 1 (Week 2): STG=LTG due to ELOS  Skilled Therapeutic Interventions: Skilled ST treatment focused on language goals and education with pt's spouse. SLP facilitated session by providing education regarding patient's current deficits and their impact on expressive and receptive language skills, attention, functional communication, and overall safety. SLP demonstrated use of communication strategies in use during functional language tasks using items from [employer] kit to demonstrate pt's ability to name objects, identify objects, yes/no question comprehension, word level reading, following simple commands, and visual considerations (utilize R visual field). SLP reinforced the importance of 24 hour supervision to ensure pt's safety due to cognitive-linguistic, language, visual, and physical deficits. Spouse verbalized understanding through teach back and stated family is arranging to have 24 hour supervision. Discussed pt's current diet of dysphagia 3 textures and thin liquids with crushed meds. Pt verbalized preference to remain on dysphagia 3 diet at this time and would like to continue receiving crushed. Will plan to touch base regarding swallowing needs early next week. All questions were addressed to satisfaction. Pt transferred to bed with min A verbal cues for following commands. Patient left upright in recliner with all needs within reach. Continue with current plan of care.     Pain  None/denied  Therapy/Group: Individual Therapy  Patty Sermons 10/14/2022, 3:58 PM

## 2022-10-14 NOTE — Progress Notes (Signed)
Physical Therapy Session Note  Patient Details  Name: James Estrada SEE MRN: [id number] Date of Birth: [birth year]  Today's Date: 10/14/2022 PT Individual Time:  1536- 26 Pt Individual Time Calculation (min): 11     Short Term Goals: Week 1:  PT Short Term Goal 1 (Week 1): Pt will perform bed mobility with improved initiation, understanding of vc and overall MinA. PT Short Term Goal 1 - Progress (Week 1): Met PT Short Term Goal 2 (Week 1): Pt will perform sit<>stand with overall Min/ModA while adhering to sternal precautions. PT Short Term Goal 2 - Progress (Week 1): Met PT Short Term Goal 3 (Week 1): Pt will perform stand pivot transfers with overall ModA requring <50% assist with manual weight shift facilitation. PT Short Term Goal 3 - Progress (Week 1): Met PT Short Term Goal 4 (Week 1): Pt will initiate gait training. PT Short Term Goal 4 - Progress (Week 1): Met PT Short Term Goal 5 (Week 1): Pt will initiate step training. PT Short Term Goal 5 - Progress (Week 1): Met  Skilled Therapeutic Interventions/Progress Updates:   Pt supine in bed on entrance. Pt alert and agreeable for treatment session. Pt reports no pain complaint during treatment session. Pt  concerned that catheter bag was full, provided total assist with emptying at beginning of treatment session.  Therapeutic Activity:  Transfers: Pt completed supine to sit at EOB and sit<> stand with close supervision. Demonstrated stand pivot, ambulatory transfers with close supervision, requiring verbal cues for positioning to improve safety.    Gait: Pt ambulated 2x > 150 ft 1x100 ft using RW with CGA to close supervision. Provided verbal and tactile cuing for maintaining erect posture and direction changes. Pt demonstrated step through gait pattern and continues to show decrease gait speed during direction changes and occasional downward head gaze during ambulation.  Neuromuscular Re-education: NMR was performed to improve  motor control, coordination, balance, cognition, judgment, and confidence performed in all aspects of mobility at the highest level.  Pt performed sit to stands (2 x 5) toe taps on 6 inch step (2 x 8) using RW with CGA. Required consistent verbal and tactile cuing for initiating  movement and performing correct sequence.  Navigated 4 6 inch steps x2 using both hand rails with CGA, demonstrated step to gait pattern with intermittent freezing before taking initial steps ascending/ descending. Appeared to show increase agitation with functional activity, provided Pt education along with positive reinforcement before changing activity for improved response. Ambulated 2x50 ft 1 x 100 ft while locating three colored cones positioned throughout therapy gym. Pt required verbal and tactile cuing for locating cones but able to accurately point at, identify color, and hand cone to PT upon request with minimal assistance. Pt ambulated x2 with direction changes around 4 stationary chairs positioned 4 feet apart within 50 ft.  Pt continues to show mental fatigue while performing cognitive activities and requires rest break to reset. Shows frustration when unable to communicate effectively. Provide positive encouragement with slight redirect to maintain productivity during therapy session.  Pt ambulated back to room using RW with close supervision and performed ambulatory transfer to recliner with family member present.   Pt seated upright in recliner with belt alarm on, call/bell close by and all needs met.  Therapy Documentation Precautions:  Precautions Precautions: Sternal, Fall Precaution Comments: L inattention Restrictions Weight Bearing Restrictions: Yes Other Position/Activity Restrictions: sternal precautions - keep hands close to body with mobility   Vital Signs: Therapy Vitals Temp:  98.7 F (37.1 C) Temp Source: Oral Pulse Rate: 69 Resp: 18 BP: 129/72 Patient Position (if appropriate):  Lying Oxygen Therapy SpO2: 98 % O2 Device: Room Air Pain: Pain Assessment Pain Scale: 0-10 Pain Score: 0-No pain Faces Pain Scale: No hurt Multiple Pain Sites: No  Therapy/Group: Individual Therapy  Hilary Hertz PT, SPT  Hilary Hertz 10/14/2022, 5:13 PM

## 2022-10-14 NOTE — Progress Notes (Signed)
Patient education scheduled this morning. Was informed by case manager to go over foley/leg bag and any other question re: medications. Patient's wife states she will ask about patient's medications on his discharge date and that learning everything today is not urgent for her as "he is not leaving until Wednesday." We did go over buying briefs for the patient, sizing, how to clean the bags, and how to empty the foley drain bag. We discussed when to use a leg bag vs when to use a foley. Wife states she is familiar with this as he has had a foley in the past. Patient's leg bag was ordered by [employer] operational officer, informed wife that when leg bag is present we can show her how to switch it over, she states she can learn another day if she is gone by time it arrives.

## 2022-10-14 NOTE — Progress Notes (Signed)
Occupational Therapy Session Note  Patient Details  Name: James Estrada MRN: [id number] Date of Birth: 13-Aug-1941  Today's Date: 10/14/2022 OT Individual Time: 7948-0165 OT Individual Time Calculation (min): 61 min    Short Term Goals: Week 2:  OT Short Term Goal 1 (Week 2): STGs = LTGs (some goals have been upgraded)  Skilled Therapeutic Interventions/Progress Updates:  Pt greeted seated in recliner with family present, daughter James Estrada) and wife ( James Estrada) for family education. General education provided on pts current level of assist for functional mobility being CGA for functional ambulation with Rw, education provided on recommendation of 24/7 assistance of recommendation for pt to always use Rw and gait belt during all functional mobility tasks.  Education provided on pts current level of assist for ADLs with education on pts L inattention and implications of this during ADLS, additionally provided education on apraxia and implications related to ADLs.   Pt completed ambulatory transfer down to tub room with daughter assisting pt with mobility with Rw and gait belt, demo'ed tub transfer technique to family with wife able to assist pt into TTB with CGA and MOD multimodal cues to sequence novel tasks. Education provided on using less words when giving directions and providing visual and tactile cues when completing motor planning task. Education provided on general fall prevention technique for showering with family verbalizing understanding.   Pt able to demo supine<>sit transfer to flat [facility] with CGA and MIN verbal cues for technique, daughter plans to have pt sleep in twin bed and plans to order bed rails, with education provided on where to purchase. Discussed uses of BSC with recommendation to place [facility] over toilet at home to elevate seat and provide bilateral arm rests. Did practice walkin shower transfer as pt will have walkin shower once pt returns home. Pt needed MAX multimodal  cues to problem solve stepping over ledge of shower with RW. Education provided on recommendation of having HH work on [employer] transfer to ensure safety/ carryover of technique.   Pt completed functional ambulation back to room with RW and CGA- Supervision pt needed directional cues especially when turning to L side. Education provided to family on activities to work on at home such as visual scanning with family using post its on halls and having pt locate notes in sequential order to work on L sided attention.    Ended session with pt seated in recliner with all needs within reach and family present.          Therapy Documentation Precautions:  Precautions Precautions: Sternal, Fall Precaution Comments: L inattention Restrictions Weight Bearing Restrictions: Yes Other Position/Activity Restrictions: sternal precautions - keep hands close to body with mobility   Pain: No pain reported/noted    Therapy/Group: Individual Therapy  Pollyann Glen [facility] 10/14/2022, 12:08 PM

## 2022-10-14 NOTE — Progress Notes (Addendum)
Patient ID: James Estrada, male   DOB: 1941-05-13, 81 y.o.   MRN: [id number]  WC, Rolling walker, bedside commode and TTB ordered through adapt.  Family requesting hospital bed, order placed.

## 2022-10-14 NOTE — Progress Notes (Signed)
Physical Therapy Session Note  Patient Details  Name: James Estrada MRN: [id number] Date of Birth: 13-Jun-1941  Today's Date: 10/14/2022 PT Individual Time 907-945 38 min     Short Term Goals: Week 1:  PT Short Term Goal 1 (Week 1): Pt will perform bed mobility with improved initiation, understanding of vc and overall MinA. PT Short Term Goal 1 - Progress (Week 1): Met PT Short Term Goal 2 (Week 1): Pt will perform sit<>stand with overall Min/ModA while adhering to sternal precautions. PT Short Term Goal 2 - Progress (Week 1): Met PT Short Term Goal 3 (Week 1): Pt will perform stand pivot transfers with overall ModA requring <50% assist with manual weight shift facilitation. PT Short Term Goal 3 - Progress (Week 1): Met PT Short Term Goal 4 (Week 1): Pt will initiate gait training. PT Short Term Goal 4 - Progress (Week 1): Met PT Short Term Goal 5 (Week 1): Pt will initiate step training. PT Short Term Goal 5 - Progress (Week 1): Met Week 2:  PT Short Term Goal 1 (Week 2): STG=LTG due to ELOS Week 3:     Skilled Therapeutic Interventions/Progress Updates:   Pt received sitting in recliner and agreeable to PT. Pt performed sit<>stand transfer with RW and supervision assist with cues for UE placement on arm rest of chair. Daughter and wife present for education throughout session pt performed gait training through unit with RW 135f, 1485fand 20019fith cues for direction and awareness of obstacles on the L side intermittently. Pt performed car transfer with supervision assist and RW. Stair management training with BUE on 2 rails, then HHA and 1 Rail for UE support and use of QC and 1 hand on the L. Each performed x 4 with cues for AD management. Pt noted to ascend with LLE and descend with RLE despite cues for step to pattern for L side weakness. Patient returned to room and performed stand pivot to recliner with supervision assist and RW for safety. Pt left sitting in recliner with call  bell in reach and all needs met.           Therapy Documentation Precautions:  Precautions Precautions: Sternal, Fall Precaution Comments: L inattention Restrictions Weight Bearing Restrictions: Yes Other Position/Activity Restrictions: sternal precautions - keep hands close to body with mobility  Vital Signs: Therapy Vitals Temp: 98.7 F (37.1 C) Temp Source: Oral Pulse Rate: 60 Resp: 16 BP: (!) 124/56 Patient Position (if appropriate): Lying Pain: denies    Therapy/Group: Individual Therapy  AusLorie Phenix/11/2021, 7:57 AM

## 2022-10-14 NOTE — Progress Notes (Signed)
PROGRESS NOTE   Subjective/Complaints:  Pt without new issues, discussed why he need sternotomy for aneurysm repair Occ sternal pain  ROS: Limited d/t aphasia  Objective:   No results found. No results for input(s): "WBC", "HGB", "HCT", "PLT" in the last 72 hours.  No results for input(s): "NA", "K", "CL", "CO2", "GLUCOSE", "BUN", "CREATININE", "CALCIUM" in the last 72 hours.   Intake/Output Summary (Last 24 hours) at 10/14/2022 0836 Last data filed at 10/14/2022 0747 Gross per 24 hour  Intake 920 ml  Output 2625 ml  Net -1705 ml         Physical Exam: Vital Signs Blood pressure (!) 124/56, pulse 60, temperature 98.7 F (37.1 C), temperature source Oral, resp. rate 16, weight 74.6 kg, SpO2 94 %. Physical Exam   General: No acute distress Mood and affect are appropriate Heart: Regular rate and rhythm no rubs murmurs or extra sounds Lungs: Clear to auscultation, breathing unlabored, no rales or wheezes Abdomen: Positive bowel sounds, soft nontender to palpation, nondistended Extremities: No clubbing, cyanosis, or edema Skin: No evidence of breakdown, no evidence of rash  Musculoskeletal:  FROM in BUE , no tenderness parasternal   Skin:    General: Skin is warm and dry. Mild ecchymosis on nose - resolving  Neurological:     Mental Status:  Oriented to self only.  Language: Fluent but with significant receptive and expressive aphasia. perseveration -  Can follow simple commands  +apraxia but improving  4/5 LUE, 5/5 RUE 5/5 RLE 4+ LLE   Speech fluent with anomia   Psychiatric:     Comments: Appropriate mood and affect.        Assessment/Plan: 1. Functional deficits which require 3+ hours per day of interdisciplinary therapy in a comprehensive inpatient rehab setting. Physiatrist is providing close team supervision and 24 hour management of active medical problems listed below. Physiatrist and rehab  team continue to assess barriers to discharge/monitor patient progress toward functional and medical goals  Care Tool:  Bathing    Body parts bathed by patient: Chest, Abdomen, Face, Left upper leg, Right upper leg, Left arm   Body parts bathed by helper: Right arm, Front perineal area, Buttocks, Right lower leg, Left lower leg     Bathing assist Assist Level: Moderate Assistance - Patient 50 - 74%     Upper Body Dressing/Undressing Upper body dressing   What is the patient wearing?: Pull over shirt    Upper body assist Assist Level: Maximal Assistance - Patient 25 - 49%    Lower Body Dressing/Undressing Lower body dressing      What is the patient wearing?: Incontinence brief, Pants     Lower body assist Assist for lower body dressing: Maximal Assistance - Patient 25 - 49%     Toileting Toileting    Toileting assist Assist for toileting: Moderate Assistance - Patient 50 - 74%     Transfers Chair/bed transfer  Transfers assist  Chair/bed transfer activity did not occur: Safety/medical concerns  Chair/bed transfer assist level: Contact Guard/Touching assist     Locomotion Ambulation   Ambulation assist   Ambulation activity did not occur: Safety/medical concerns (dizziness and mild diaphoresis on standing)  Assist level: Contact Guard/Touching assist Assistive device: Walker-rolling Max distance: 60 ft   Walk 10 feet activity   Assist  Walk 10 feet activity did not occur: Safety/medical concerns  Assist level: Contact Guard/Touching assist Assistive device: Walker-rolling   Walk 50 feet activity   Assist Walk 50 feet with 2 turns activity did not occur: Safety/medical concerns  Assist level: Minimal Assistance - Patient > 75% Assistive device: Walker-rolling    Walk 150 feet activity   Assist Walk 150 feet activity did not occur: Safety/medical concerns         Walk 10 feet on uneven surface  activity   Assist Walk 10 feet on  uneven surfaces activity did not occur: Safety/medical concerns         Wheelchair     Assist Is the patient using a wheelchair?: Yes Type of Wheelchair: Manual    Wheelchair assist level: Dependent - Patient 0% Max wheelchair distance: 35 ft    Wheelchair 50 feet with 2 turns activity    Assist        Assist Level: Dependent - Patient 0%   Wheelchair 150 feet activity     Assist      Assist Level: Dependent - Patient 0%   Blood pressure (!) 124/56, pulse 60, temperature 98.7 F (37.1 C), temperature source Oral, resp. rate 16, weight 74.6 kg, SpO2 94 %.    Medical Problem List and Plan: 1. 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  Antithrombotics: -DVT/anticoagulation: If atrial fibrillation recurs, may need to consider AC             -antiplatelet therapy: aspirin 81 mg daily             SCDs and TEDs; consider starting Lovenox 3. Pain Management: Tylenol as needed 4. Mood/Behavior/Sleep: LCSW to evaluate and provide emotional support Discussed anxiety and depression with pt and wife, no prior hx or prior meds, will start low dose celexa, neuropsych eval may be limited due to aphasia              -antipsychotic agents: n/a 5. Neuropsych/cognition: This patient is not capable of making decisions on his own behalf. 6. Skin/Wound Care: Routine skin care checks  7. Fluids/Electrolytes/Nutrition: Strict Is and Os and follow-up chemistries             -daily weight             -continue dysphagia 2 diet/thins; SLP eval             -continue feeding supplement  8: Heart failure with pEF (see meds below)             -monitor Mg and K             -daily weight (weight range  65kg)             -follow-up with Dr. Haroldine Laws              - uptrending, appearance euvolemic, labs with mild Cr increase but otherwise stable.   12/1 ? Change in measurement technique 10kg difference does  not correspond with I/Os   Filed Weights   10/12/22 0500 10/13/22 0500 10/14/22 0441  Weight: 81.4 kg 84.1 kg 74.6 kg     9: Leukocytosis/leukemoid reaction: resolved  -completed Zosyn, starting Augmentin BID for 10 days thru 12/1                 Latest Ref Rng & Units 10/10/2022    5:13 AM 10/07/2022    4:48 AM 10/05/2022    6:18 AM  CBC  WBC 4.0 - 10.5 K/uL 10.4  15.1  13.9   Hemoglobin 13.0 - 17.0 g/dL 9.0  8.7  8.3   Hematocrit 39.0 - 52.0 % 29.3  27.6  25.9   Platelets 150 - 400 K/uL 247  378  310      10: Post-op atrial fibrillation:  -continue amiodarone 200 mg BID for 7 days then 200 mg daily             -if recurs, consider AC  - HR well controlled; monitor    10/14/2022    4:41 AM 10/13/2022    8:00 PM 10/13/2022    2:41 PM  Vitals with BMI  Weight 164 lbs 7 oz    BMI [id number]    Systolic [id number] 98  Diastolic 56 60 64  Pulse 60 65 63     11: Left renal artery stenosis: follow-up as outpatient 12: Acute blood loss anemia: follow-up CBC  - stable 13: Hyperlipidemia: continue Lipitor 40 mg daily 14: GERD/HH: no meds (Pepcid discontinued) 15: Hypertension: montior TID and prn             -continue Lopressor 12.5 mg BID             -restart losartan 50 mg daily, Spironolactone 12.5 mg as BP allows   Vitals:   10/13/22 2000 10/14/22 0441  BP:  (!) 124/56  Pulse:  60  Resp:  16  Temp:  98.7 F (37.1 C)  SpO2: 94%   controlled 12/1 16: Acute urinary retention:             -has foley cath - Dced on admission, sarting PVRs Q6H for voiding trial             -continue Flomax 0.4 mg daily -            -failed voiding trial x 1 , home with foley   17: Acute kidney injury: serum creatinine improving; follow-up BMP    Latest Ref Rng & Units 10/10/2022     5:13 AM 10/07/2022    4:48 AM 10/05/2022    6:18 AM  BMP  Glucose 70 - 99 mg/dL 110  113  120   BUN 8 - 23 mg/dL 17  18  18    Creatinine 0.61 - 1.24 mg/dL 1.13  1.22  1.08   Sodium 135 - 145 mmol/L 137  141  137   Potassium 3.5 - 5.1 mmol/L 4.0  3.8  3.9   Chloride 98 - 111 mmol/L 106  103  107   CO2 22 - 32 mmol/L 23  24  23    Calcium 8.9 - 10.3 mg/dL 8.2  8.7  8.1     19. Constipation           - LBM 11/27            - Added Sennakot-S 1 tab QHS  LOS: 10 days A FACE TO FACE EVALUATION WAS PERFORMED  Charlett Blake 10/14/2022, 8:36  AM

## 2022-10-15 DIAGNOSIS — I6319 Cerebral infarction due to embolism of other precerebral artery: Secondary | ICD-10-CM

## 2022-10-15 DIAGNOSIS — I69320 Aphasia following cerebral infarction: Secondary | ICD-10-CM

## 2022-10-15 DIAGNOSIS — R339 Retention of urine, unspecified: Secondary | ICD-10-CM

## 2022-10-15 DIAGNOSIS — I10 Essential (primary) hypertension: Secondary | ICD-10-CM

## 2022-10-15 NOTE — Progress Notes (Signed)
Physical Therapy Session Note  Patient Details  Name: James Estrada MRN: [id number] Date of Birth: Nov 14, 1941  Today's Date: 10/15/2022 PT Individual Time: 0910-1018 PT Individual Time Calculation (min): 68 min   Short Term Goals: Week 2:  PT Short Term Goal 1 (Week 2): STG=LTG due to ELOS  Skilled Therapeutic Interventions/Progress Updates:   Pt received sitting in recliner and agreeable to PT. Pt performed sit>stand transfer with supervision and cues from PT for safety to push from arm rest.   Pt performed gait training with RW through hall of rehab unit x 146f, [id number]  [id number] assist from PT with cues for direction only. Additional gait training without AD 2 x [id number] supervision assist-CGA with facilitation for weight shift due to mild posterior bias.   Dyanmic standing balance performed in parallel bars on red and blue wedge to force heel cord stretch to perform retrieval clothes pins and perform cross body reach to place pins in bucket. Min assist initially to understand task objective, but able to perform 2 x 12 Bil with only min cues for visual scanning to the L to locate pins and bucket on the L side. Pt performed color identification of pegs from board. Able to name individual colors, but unable to name or grab one of 2 colors on command . Attempted in sitting, with no change in success. Pt then performed reaching task to grab 1 of 4 pegs at a time and able to appropriately name color once peg in hand.   Stairs with QC on the RUE and min assist from HHA on the LUE. Cues for step to gait pattern, but pt ascent with LLE and the descent leading with the  RLE. Family present to observe stair management with QC.   Nustep reciprocal movement training BUE/BLE performed with BUE and BLE. Pt reports no pain at surgery site. Performed x 8 min with cues to retain completion time,but unable to recall.   Patient returned to room and performed stand pivot to recliner  with RW and supervision assist. Pt left sitting in recliner with call bell in reach and all needs met.         Therapy Documentation Precautions:  Precautions Precautions: Sternal, Fall Precaution Comments: L inattention Restrictions Weight Bearing Restrictions: Yes Other Position/Activity Restrictions: sternal precautions - keep hands close to body with mobility Pain:  Denies    Therapy/Group: Individual Therapy  AustiLorie Phenix/2023, 12:14 PM

## 2022-10-15 NOTE — Progress Notes (Signed)
PROGRESS NOTE   Subjective/Complaints:  James Estrada up in bed. Happy with his progress. Going home in 5 days.   ROS: limited due to language/communication   Objective:   No results found. No results for input(s): "WBC", "HGB", "HCT", "PLT" in the last 72 hours.  No results for input(s): "NA", "K", "CL", "CO2", "GLUCOSE", "BUN", "CREATININE", "CALCIUM" in the last 72 hours.   Intake/Output Summary (Last 24 hours) at 10/15/2022 1131 Last data filed at 10/15/2022 [id number] Gross per 24 hour  Intake 420 ml  Output 1350 ml  Net -930 ml        Physical Exam: Vital Signs Blood pressure 128/75, pulse 66, temperature 98.3 F (36.8 C), temperature source Oral, resp. rate 18, weight 80.2 kg, SpO2 97 %. Physical Exam   Constitutional: No distress . Vital signs reviewed. HEENT: NCAT, EOMI, oral membranes moist Neck: supple Cardiovascular: RRR without murmur. No JVD    Respiratory/Chest: CTA Bilaterally without wheezes or rales. Normal effort    GI/Abdomen: BS +, non-tender, non-distended Ext: no clubbing, cyanosis, or edema Psych: pleasant and cooperative  Musculoskeletal:  FROM in BUE , no tenderness parasternal   Skin:    General: Skin is warm and dry. Mild ecchymosis nose, better Neurological:     Mental Status:  Oriented to self only.  Language: Fluent but with significant receptive and expressive aphasia. perseveration - is able to communicate thoughts now with extra time and some cueing. Can follow simple commands  +apraxia but improving  4/5 LUE, 5/5 RUE 5/5 RLE 4+ LLE               Assessment/Plan: 1. Functional deficits which require 3+ hours per day of interdisciplinary therapy in a comprehensive inpatient rehab setting. Physiatrist is providing close team supervision and 24 hour management of active medical problems listed below. Physiatrist and rehab team continue to assess barriers to discharge/monitor patient  progress toward functional and medical goals  Care Tool:  Bathing    Body parts bathed by patient: Chest, Abdomen, Face, Left upper leg, Right upper leg, Left arm   Body parts bathed by helper: Right arm, Front perineal area, Buttocks, Right lower leg, Left lower leg     Bathing assist Assist Level: Moderate Assistance - Patient 50 - 74%     Upper Body Dressing/Undressing Upper body dressing   What is the patient wearing?: Pull over shirt    Upper body assist Assist Level: Maximal Assistance - Patient 25 - 49%    Lower Body Dressing/Undressing Lower body dressing      What is the patient wearing?: Incontinence brief, Pants     Lower body assist Assist for lower body dressing: Maximal Assistance - Patient 25 - 49%     Toileting Toileting    Toileting assist Assist for toileting: Moderate Assistance - Patient 50 - 74%     Transfers Chair/bed transfer  Transfers assist  Chair/bed transfer activity did not occur: Safety/medical concerns  Chair/bed transfer assist level: Contact Guard/Touching assist     Locomotion Ambulation   Ambulation assist   Ambulation activity did not occur: Safety/medical concerns (dizziness and mild diaphoresis on standing)  Assist level: Contact Guard/Touching assist Assistive  device: Walker-rolling Max distance: 60 ft   Walk 10 feet activity   Assist  Walk 10 feet activity did not occur: Safety/medical concerns  Assist level: Contact Guard/Touching assist Assistive device: Walker-rolling   Walk 50 feet activity   Assist Walk 50 feet with 2 turns activity did not occur: Safety/medical concerns  Assist level: Minimal Assistance - Patient > 75% Assistive device: Walker-rolling    Walk 150 feet activity   Assist Walk 150 feet activity did not occur: Safety/medical concerns         Walk 10 feet on uneven surface  activity   Assist Walk 10 feet on uneven surfaces activity did not occur: Safety/medical  concerns         Wheelchair     Assist Is the patient using a wheelchair?: Yes Type of Wheelchair: Manual    Wheelchair assist level: Dependent - Patient 0% Max wheelchair distance: 35 ft    Wheelchair 50 feet with 2 turns activity    Assist        Assist Level: Dependent - Patient 0%   Wheelchair 150 feet activity     Assist      Assist Level: Dependent - Patient 0%   Blood pressure 128/75, pulse 66, temperature 98.3 F (36.8 C), temperature source Oral, resp. rate 18, weight 80.2 kg, SpO2 97 %.    Medical Problem List and Plan: 1. 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  Antithrombotics: -DVT/anticoagulation: If atrial fibrillation recurs, may need to consider AC             -antiplatelet therapy: aspirin 81 mg daily             SCDs and TEDs; consider starting Lovenox 3. Pain Management: Tylenol as needed 4. Mood/Behavior/Sleep:     -celexa for anxiety -neuropsych eval may be limited due to aphasia              -antipsychotic agents: n/a 5. Neuropsych/cognition: This patient is not capable of making decisions on his own behalf. 6. Skin/Wound Care: Routine skin care checks  7. Fluids/Electrolytes/Nutrition: Strict Is and Os and follow-up chemistries             -daily weight             -continue dysphagia 2 diet/thins; SLP eval             -continue feeding supplement  8: Heart failure with pEF (see meds below)             -monitor Mg and K             -daily weight (weight range 65kg)             -follow-up with Dr. Haroldine Laws              - uptrending, appearance euvolemic, labs with mild Cr increase but otherwise stable.   12/2 weight trending up but he  doesn't look fluid overloaded  -volume negative since admit Filed Weights   10/14/22 0441 10/15/22 0338 10/15/22 0459  Weight: 74.6 kg 77.7 kg 80.2 kg  9: Leukocytosis/leukemoid reaction: resolved  -completed Zosyn, starting Augmentin BID for 10 days thru 12/1                 Latest Ref Rng & Units 10/10/2022    5:13 AM 10/07/2022    4:48 AM 10/05/2022    6:18 AM  CBC  WBC 4.0 - 10.5 K/uL 10.4  15.1  13.9   Hemoglobin 13.0 - 17.0 g/dL 9.0  8.7  8.3   Hematocrit 39.0 - 52.0 % 29.3  27.6  25.9   Platelets 150 - 400 K/uL 247  378  310      10: Post-op atrial fibrillation:  -continue amiodarone 200 mg BID for 7 days then 200 mg daily             -if recurs, consider AC  - HR well controlled; monitor    10/15/2022    4:59 AM 10/15/2022    3:38 AM 10/14/2022    7:36 PM  Vitals with BMI  Weight 176 lbs 13 oz 171 lbs 5 oz   BMI 23.97 23.23   Systolic  128 126  Diastolic  75 82  Pulse  66 74     11: Left renal artery stenosis: follow-up as outpatient 12: Acute blood loss anemia: follow-up CBC  - stable 13: Hyperlipidemia: continue Lipitor 40 mg daily 14: GERD/HH: no meds (Pepcid discontinued) 15: Hypertension: montior TID and prn             -continue Lopressor 12.5 mg BID             -restart losartan 50 mg daily, Spironolactone 12.5 mg as BP allows   Vitals:   10/14/22 1936 10/15/22 0338  BP: 126/82 128/75  Pulse: 74 66  Resp: 16 18  Temp: 98.5 F (36.9 C) 98.3 F (36.8 C)  SpO2: 98% 97%  controlled 12/2 16: Acute urinary retention:             -has foley cath - Dced on admission, sarting PVRs Q6H for voiding trial             -continue Flomax 0.4 mg daily -            -failed voiding trial x 1 , home with foley   17: Acute kidney injury: serum creatinine improving; follow-up BMP    Latest Ref Rng & Units 10/10/2022    5:13 AM 10/07/2022    4:48 AM 10/05/2022    6:18 AM  BMP  Glucose 70 - 99 mg/dL 854  627  035   BUN 8 - 23 mg/dL 17  18  18     Creatinine 0.61 - 1.24 mg/dL  0.09  3.81   Sodium 135 - 145 mmol/L 137  141  137   Potassium 3.5 - 5.1 mmol/L 4.0  3.8  3.9   Chloride 98 - 111 mmol/L 106  103  107   CO2 22 - 32 mmol/L 23  24  23    Calcium 8.9 - 10.3 mg/dL 8.2  8.7  8.1     19. Constipation           - LBM 11/30            - Added Senokot-S 1 tab QHS  LOS: 11 days A FACE TO FACE EVALUATION WAS PERFORMED  10/15/2022, 11:31 AM

## 2022-10-16 NOTE — Progress Notes (Signed)
Speech Language Pathology Daily Session Note  Patient Details  Name: James Estrada MRN: [id number] Date of Birth: [birth year]  Today's Date: 10/16/2022 SLP Individual Time: 1300-1400 SLP Individual Time Calculation (min): 60 min  Short Term Goals: Week 2: SLP Short Term Goal 1 (Week 2): STG=LTG due to ELOS  Skilled Therapeutic Interventions:  Pt was seen for skilled ST targeting goals for communication.  Upon arrival, pt was seated in recliner with family at bedside.  Pt ambulated to ST treatment room for scheduled appt per family and pt's request .   Min cues needed for safety due to slight impulsivity.  Pt required max to total assist to match word to object from a field of three; however, he was able to read the written word for each object for 100% accuracy with mod I.  Pt was able to describe actions in pictures at the short phrase level with min-mod assist verbal cues.  Pt was also able to identify and describe safety concerns in pictures at the short phrase level with min-mod assist verbal cues.  At the end of today's session, pt stated "That was harder than what we usually do."  Pt was returned to room and left in bed with bed alarm set and call bell within reach.  Continue per current plan of care.    Pain Pain Assessment Pain Scale: 0-10 Pain Score: 0-No pain  Therapy/Group: Individual Therapy  James Estrada, Melanee Spry 10/16/2022, 1:56 PM

## 2022-10-17 DIAGNOSIS — N179 Acute kidney failure, unspecified: Secondary | ICD-10-CM

## 2022-10-17 DIAGNOSIS — K5900 Constipation, unspecified: Secondary | ICD-10-CM

## 2022-10-17 DIAGNOSIS — D72829 Elevated white blood cell count, unspecified: Secondary | ICD-10-CM

## 2022-10-17 LAB — BASIC METABOLIC PANEL
Anion gap: 8 (ref 5–15)
BUN: 15 mg/dL (ref 8–23)
CO2: 24 mmol/L (ref 22–32)
Calcium: 8.4 mg/dL — ABNORMAL LOW (ref 8.9–10.3)
Chloride: 106 mmol/L (ref 98–111)
Creatinine, Ser: 1.13 mg/dL (ref 0.61–1.24)
GFR, Estimated: >60 mL/min (ref >60)
Glucose, Bld: 124 mg/dL — ABNORMAL HIGH (ref 70–99)
Potassium: 3.9 mmol/L (ref 3.5–5.1)
Sodium: 138 mmol/L (ref 135–145)

## 2022-10-17 LAB — CBC
HCT: 31.8 % — ABNORMAL LOW (ref 39.0–52.0)
Hemoglobin: 10.1 g/dL — ABNORMAL LOW (ref 13.0–17.0)
MCH: 28 pg (ref 26.0–34.0)
MCHC: 31.8 g/dL (ref 30.0–36.0)
MCV: 88.1 fL (ref 80.0–100.0)
Platelets: 140 10*3/uL — ABNORMAL LOW (ref 150–400)
RBC: 3.61 MIL/uL — ABNORMAL LOW (ref 4.22–5.81)
RDW: 15.6 % — ABNORMAL HIGH (ref 11.5–15.5)
WBC: 9 10*3/uL (ref 4.0–10.5)
nRBC: 0 % (ref 0.0–0.2)

## 2022-10-17 NOTE — Progress Notes (Signed)
PROGRESS NOTE   Subjective/Complaints:  PT seen in gym, no new concerns or complaints today.  ROS: limited due to language/communication   Objective:   No results found. Recent Labs    10/17/22 0759  WBC 9.0  HGB 10.1*  HCT 31.8*  PLT 140*    Recent Labs    10/17/22 0759  NA 138  K 3.9  CL 106  CO2 24  GLUCOSE 124*  BUN 15  CREATININE 1.13  CALCIUM 8.4*     Intake/Output Summary (Last 24 hours) at 10/17/2022 1421 Last data filed at 10/17/2022 1306 Gross per 24 hour  Intake 240 ml  Output 1925 ml  Net -1685 ml         Physical Exam: Vital Signs Blood pressure 103/64, pulse 70, temperature 98.4 F (36.9 C), resp. rate 16, weight 76 kg, SpO2 100 %. Physical Exam   Constitutional: No distress . Vital signs reviewed. Working in gym with therapy.  HEENT: NCAT, EOMI, oral membranes moist Neck: supple Cardiovascular: RRR without murmur. No JVD    Respiratory/Chest: CTA Bilaterally without wheezes or rales. Normal effort    GI/Abdomen: BS +, non-tender, non-distended Ext: no clubbing, cyanosis, or edema Psych: pleasant and cooperative  Musculoskeletal:  FROM in BUE , no tenderness parasternal   Skin:    General: Skin is warm and dry. Mild ecchymosis nose, better Neurological:     Mental Status:  Oriented to self only.  Language: Fluent but with significant receptive and expressive aphasia. perseveration - is able to communicate thoughts now with extra time and some cueing. Can follow simple commands  +apraxia but improving  4/5 LUE, 5/5 RUE 5/5 RLE 4+ LLE               Assessment/Plan: 1. Functional deficits which require 3+ hours per day of interdisciplinary therapy in a comprehensive inpatient rehab setting. Physiatrist is providing close team supervision and 24 hour management of active medical problems listed below. Physiatrist and rehab team continue to assess barriers to  discharge/monitor patient progress toward functional and medical goals  Care Tool:  Bathing    Body parts bathed by patient: Chest, Abdomen, Face, Left upper leg, Right upper leg, Left arm   Body parts bathed by helper: Right arm, Front perineal area, Buttocks, Right lower leg, Left lower leg     Bathing assist Assist Level: Moderate Assistance - Patient 50 - 74%     Upper Body Dressing/Undressing Upper body dressing   What is the patient wearing?: Pull over shirt    Upper body assist Assist Level: Maximal Assistance - Patient 25 - 49%    Lower Body Dressing/Undressing Lower body dressing      What is the patient wearing?: Incontinence brief, Pants     Lower body assist Assist for lower body dressing: Maximal Assistance - Patient 25 - 49%     Toileting Toileting    Toileting assist Assist for toileting: Moderate Assistance - Patient 50 - 74%     Transfers Chair/bed transfer  Transfers assist  Chair/bed transfer activity did not occur: Safety/medical concerns  Chair/bed transfer assist level: Contact Guard/Touching assist     Locomotion Ambulation  Ambulation assist   Ambulation activity did not occur: Safety/medical concerns (dizziness and mild diaphoresis on standing)  Assist level: Contact Guard/Touching assist Assistive device: Walker-rolling Max distance: 60 ft   Walk 10 feet activity   Assist  Walk 10 feet activity did not occur: Safety/medical concerns  Assist level: Contact Guard/Touching assist Assistive device: Walker-rolling   Walk 50 feet activity   Assist Walk 50 feet with 2 turns activity did not occur: Safety/medical concerns  Assist level: Minimal Assistance - Patient > 75% Assistive device: Walker-rolling    Walk 150 feet activity   Assist Walk 150 feet activity did not occur: Safety/medical concerns         Walk 10 feet on uneven surface  activity   Assist Walk 10 feet on uneven surfaces activity did not occur:  Safety/medical concerns         Wheelchair     Assist Is the patient using a wheelchair?: Yes Type of Wheelchair: Manual    Wheelchair assist level: Dependent - Patient 0% Max wheelchair distance: 35 ft    Wheelchair 50 feet with 2 turns activity    Assist        Assist Level: Dependent - Patient 0%   Wheelchair 150 feet activity     Assist      Assist Level: Dependent - Patient 0%   Blood pressure 103/64, pulse 70, temperature 98.4 F (36.9 C), resp. rate 16, weight 76 kg, SpO2 100 %.    Medical Problem List and Plan: 1. 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  Antithrombotics: -DVT/anticoagulation: If atrial fibrillation recurs, may need to consider AC             -antiplatelet therapy: aspirin 81 mg daily             SCDs and TEDs; consider starting Lovenox 3. Pain Management: Tylenol as needed 4. Mood/Behavior/Sleep:     -celexa for anxiety -neuropsych eval may be limited due to aphasia              -antipsychotic agents: n/a 5. Neuropsych/cognition: This patient is not capable of making decisions on his own behalf. 6. Skin/Wound Care: Routine skin care checks  7. Fluids/Electrolytes/Nutrition: Strict Is and Os and follow-up chemistries             -daily weight             -continue dysphagia 2 diet/thins; SLP eval             -continue feeding supplement  8: Heart failure with pEF (see meds below)             -monitor Mg and K             -daily weight (weight range 65kg)             -follow-up with Dr. Haroldine Laws              - uptrending, appearance euvolemic, labs with mild Cr increase but otherwise stable.   12/2 weight trending up but he doesn't look  fluid overloaded  -volume negative since  admit Filed Weights   10/15/22 0459 10/16/22 0358 10/17/22 0443  Weight: 80.2 kg 75.6 kg 76 kg     9: Leukocytosis/leukemoid reaction: resolved  -completed Zosyn, starting Augmentin BID for 10 days thru 12/1                 Latest Ref Rng & Units 10/17/2022    7:59 AM 10/10/2022    5:13 AM 10/07/2022    4:48 AM  CBC  WBC 4.0 - 10.5 K/uL 9.0  10.4  15.1   Hemoglobin 13.0 - 17.0 g/dL 10.1  9.0  8.7   Hematocrit 39.0 - 52.0 % 31.8  29.3  27.6   Platelets 150 - 400 K/uL 140  247  378    12/4 WBC down to 9.0 today  10: Post-op atrial fibrillation:  -continue amiodarone 200 mg BID for 7 days then 200 mg daily             -if recurs, consider AC  - HR well controlled; monitor    10/17/2022    1:14 PM 10/17/2022    7:56 AM 10/17/2022    4:43 AM  Vitals with BMI  Weight   167 lbs 9 oz  BMI   [id number]  Systolic [id number]  Diastolic 64 77 75  Pulse 70 64 61     11: Left renal artery stenosis: follow-up as outpatient 12: Acute blood loss anemia: follow-up CBC  - 12/4 stable today at 10.1 HGB 13: Hyperlipidemia: continue Lipitor 40 mg daily 14: GERD/HH: no meds (Pepcid discontinued) 15: Hypertension: montior TID and prn             -continue Lopressor 12.5 mg BID             -restart losartan 50 mg daily, Spironolactone 12.5 mg as BP allows   Vitals:   10/17/22 0756 10/17/22 1314  BP: 120/77 103/64  Pulse: 64 70  Resp:  16  Temp:  98.4 F (36.9 C)  SpO2:  100%  Well controlled 12/4 16: Acute urinary retention:             -has foley cath - Dced on admission, sarting PVRs Q6H for voiding trial             -continue Flomax 0.4 mg daily -            -failed voiding trial x 1 , home with foley   17: Acute kidney injury: serum creatinine improving; follow-up BMP    Latest Ref Rng & Units 10/17/2022    7:59 AM 10/10/2022    5:13 AM 10/07/2022    4:48 AM  BMP  Glucose 70 - 99 mg/dL 124  110  113   BUN 8 - 23 mg/dL 15  17  18     Creatinine 0.61 - 1.24 mg/dL 1.13  1.13  1.22   Sodium 135 - 145 mmol/L 138  137  141   Potassium 3.5 - 5.1 mmol/L 3.9  4.0  3.8   Chloride 98 - 111 mmol/L 106  106  103   CO2 22 - 32 mmol/L 24  23  24    Calcium 8.9 - 10.3 mg/dL 8.4  8.2  8.7   12/4 Cr/Bun stable  19. Constipation           - LBM 11/30            - Added Senokot-S 1 tab QHS  12/4 LBM today-improved, monitor  LOS: 13 days A  FACE TO FACE EVALUATION WAS PERFORMED  Jennye Boroughs 10/17/2022, 2:21 PM

## 2022-10-17 NOTE — Progress Notes (Addendum)
Patient ID: DENZAL MEIR, male   DOB: February 04, 1941, 81 y.o.   MRN: [id number]  Sw met with patient, spouse and daughter in room and discussed discharge recommendations. Family prefers for patient to use rolling walker instead of the wheelchair. Contact information for [employer] provided. Family has made a decision for [facility] for follow up. No additional questions or concerns.  [facility] referral sent to [facility] for review. Patient declined due to insurance. 3:36 PM: [facility] referral sent to [place] per family request. Facility unable to accept the request due to patient SLP need. Sw will follow up with the family. Spouse informed.

## 2022-10-17 NOTE — Progress Notes (Signed)
Physical Therapy Session Note  Patient Details  Name: James Estrada MRN: [id number] Date of Birth: 1941/10/10  Today's Date: 10/17/2022 PT Individual Time: 1116-1215 PT Individual Time Calculation (min): 59 min   Short Term Goals: Week 1:  PT Short Term Goal 1 (Week 1): Pt will perform bed mobility with improved initiation, understanding of vc and overall MinA. PT Short Term Goal 1 - Progress (Week 1): Met PT Short Term Goal 2 (Week 1): Pt will perform sit<>stand with overall Min/ModA while adhering to sternal precautions. PT Short Term Goal 2 - Progress (Week 1): Met PT Short Term Goal 3 (Week 1): Pt will perform stand pivot transfers with overall ModA requring <50% assist with manual weight shift facilitation. PT Short Term Goal 3 - Progress (Week 1): Met PT Short Term Goal 4 (Week 1): Pt will initiate gait training. PT Short Term Goal 4 - Progress (Week 1): Met PT Short Term Goal 5 (Week 1): Pt will initiate step training. PT Short Term Goal 5 - Progress (Week 1): Met Week 2:  PT Short Term Goal 1 (Week 2): STG=LTG due to ELOS Week 3:     Skilled Therapeutic Interventions/Progress Updates:  Patient *** on entrance to room. Patient alert and agreeable to PT session.   Patient with no pain complaint at start of session.  Therapeutic Activity: Bed Mobility: Pt performed supine <> sit with ***. VC/ tc required for ***. Transfers: Pt performed sit<>stand and stand pivot transfers throughout session with ***. Provided verbal cues for***.  Gait Training:  Pt ambulated *** ft using *** with ***. Demonstrated ***. Provided vc/ tc for ***.  Wheelchair Mobility:  Pt propelled wheelchair *** feet with ***. Provided vc for ***.  Neuromuscular Re-ed: NMR facilitated during session with focus on***. Pt guided in ***. NMR performed for improvements in motor control and coordination, balance, sequencing, judgement, and self confidence/ efficacy in performing all aspects of mobility at  highest level of independence.   Therapeutic Exercise: Pt performed the following exercises with vc/ tc for proper technique. ***  Patient *** at end of session with brakes locked, *** alarm set, and all needs within reach.  - unable to locate w/c and wife relates that she did not want w/c for pt as he is moving/ walking very well and did not want him to rely on w/c. W/c not recommended for home, but therapy may require continued use while he is here on unit for safety and sessions.  - amb with wife around unit for family ed in providing CGA as well as for appropriate cueing.  - Wife corrected several times throughout session to reduce visual cues and attempt to guide pt in verbal only cues. Add in additional modes of cueing if pt seems unable to figure out request. - Pt ambulates ~175' with wife providing intermittent CGA. Pt self chooses to leave path and position self cornered by other large objects. Requires vc throughout for problem solving. - ambulated with pt using no AD and no UE support to therapist covering 40' with CGA initially at Bil hips and then one hand on belt at back with pt able to perform 180* turn to return to room. No LOB and improved confidence noted.  - wife then trained on providing CGA to pt for trips to bathroom. Educated on transfer of catheter bag, leaving walker out of pt's reach to encourage push-to-stand technique, hand positioning on belt, pivot turning, clothing mgmt, returning to recliner. Dtr arrives and trained on  transferring in/ out of bathroom as well.     Therapy Documentation Precautions:  Precautions Precautions: Sternal, Fall Precaution Comments: L inattention Restrictions Weight Bearing Restrictions: No Other Position/Activity Restrictions: sternal precautions - keep hands close to body with mobility General:   Vital Signs:   Pain:  No pain related this session.  Therapy/Group: Individual Therapy  Alger Simons PT, DPT, CSRS 10/17/2022,  5:26 PM

## 2022-10-17 NOTE — Progress Notes (Signed)
Speech Language Pathology Daily Session Note  Patient Details  Name: James Estrada MRN: [id number] Date of Birth: Aug 10, 1941  Today's Date: 10/17/2022 SLP Individual Time: 0925-1000 SLP Individual Time Calculation (min): 35 min and Today's Date: 10/17/2022 SLP Missed Time: 25 Minutes Missed Time Reason: Other (Comment) (family request in order to discuss discharge needs with home caregiver provider)  Short Term Goals: Week 2: SLP Short Term Goal 1 (Week 2): STG=LTG due to ELOS  Skilled Therapeutic Interventions: Skilled ST treatment focused on swallowing and language goals. Pt was accompanied by spouse, daughter, and provider who plans to "provide support with home care" upon discharge. Spouse requested time for pt/family to meet with this provider to discuss needs. Thus, pt missed initial 25 minutes of session per family request.   Pt ambulated to therapy gym for treatment with sup A verbal cues for visual scanning. SLP facilitated regular texture PO trials with functional and timely mastication,minimal oral residuals, and no overt s/sx of aspiration. Recommend diet advancement to regular textures at this time. Pt and family in agreement. Pt verbalized preference to continue crushed medications at this time. Provided family with pill crusher.  SLP facilitated word finding, sentence structure, and verbal production at the sentence level using Occupational psychologist (VNeST). Pt generated appropriate responses to who/what/where/when questions based on initial verb with min-to-mod verbal and visual cues in order to generate a concise and cohesive sentence. Pt verbally read at the word level with 100% accuracy with mod I, and verbalized at the sentence level with 75% accuracy given min-to-mod A verbal cues.   Patient was left in recliner with alarm activated and immediate needs within reach at end of session. Continue per current plan of care.      Pain Pain Assessment Pain Scale:  0-10 Pain Score: 0-No pain  Therapy/Group: Individual Therapy  Kemisha Bonnette T Dujuan Stankowski 10/17/2022, 9:09 AM

## 2022-10-17 NOTE — Progress Notes (Addendum)
Occupational Therapy Session Note  Patient Details  Name: James Estrada MRN: [id number] Date of Birth: 1941-05-19  Today's Date: 10/17/2022 OT Individual Time: 0805-0904 session 1 OT Individual Time Calculation (min): 59 min  Session 2: 1448-1530   Short Term Goals: Week 2:  OT Short Term Goal 1 (Week 2): STGs = LTGs (some goals have been upgraded)  Skilled Therapeutic Interventions/Progress Updates:  Session 1: Pt greeted supine in bed, pt agreeable to OT intervention. Session focus on BADL reeducation, functional mobility, visual scanning/ L sided attention, self feeding and decreasing overall caregiver burden.    Pt completed supine>sit with supervision and completed stand pivot transfer with CGA with RW to recliner. Breakfast tray arrived therefore assisted pt with self feeding with an emphasis on visual scanning to L side to locate needed items such as silverware as well as bilateral integration for ADL tasks. Pt with improved bilateral integration of using both knife and fork for self feeding. Noted more of a gross grasp when holding knife in L hand d/t impaired coordination/proprioception but overall functional. Pt noted to use bilateral hands to open packets such as tea bag and presents with good safety awareness and problem solving when making hot tea. Pt even used L hand to stir hot tea with no cues needed.   Pt completed functional ambulation to sink for oral care with RW and supervision. Pt completed standing oral care with overall set- up assist however pt required MAX cues for sequencing task as pt initially had difficulty locating tooth brush on L side of sink and no recall of needing to turn on the water, pt then perseverates on water pressure, temp etc needing to be redirected to why we were at the sink.   Pt completed functional ambulation to gym with RW and supervision, pt completed seated therapeutic activity where pt instructed to string beads together with an emphasis on  bilateral integration, word finding and L sided visual attention. Graded task up and had pt locate certain colors. Pt noted to correctly choose bead 80% of time, if pt chose the wrong color pt was able to problem solve correct color if 2 choices were provided " is that green or red?"   Pt completed functional ambulation back to room with Rw and supervision, min directional cues when turning to L side.                    Ended session with pt seated in recliner with all needs within reach and wife present.                 Session 2: Pt greeted supine in bed, pt agreeable to OT intervention. Session focus on visual scanning, L sided attention, problem solving, word finding functional mobility, dynamic standing balance and decreasing overall caregiver burden.      Pt completed supine>sit with CGA, pt completed ambulatory transfer to gym with RW and CGA. Pt completed seated therapeutic activity where pt instructed to match blocks. Blocks were laid all over table with pt needing MAX cues to locate blocks on L side of table. Pt able to choose two blocks at a time and required + time and effort to accurately state what color the blocks were. Pt continues to do well with 2 choices provided when deciding which colors pt had chosen. Overall, pt completed task with 80% accuracy.   Pt completed standing balance/cognitive task where pt stood to pass light beach ball back and forth with tech to  challenge balance with pt instructed to state words that correlated with stated letter. Pt completed task with 40% accuracy, pt did best when OTA sang alphabet song then pt able to state next letter and correlating word.   Pt completed functional ambulation greater than a household distance with RW and supervision with an emphasis on stating room numbers from R<>L while walking down hallway, pt needed MAX cues to locate numbers on R and L side of hallway but worse on L side.                   Ended session with pt seated in  recliner with all needs within reach and safety belt alarm activated.                    Therapy Documentation Precautions:  Precautions Precautions: Sternal, Fall Precaution Comments: L inattention Restrictions Weight Bearing Restrictions: No Other Position/Activity Restrictions: sternal precautions - keep hands close to body with mobility  Pain: Session 1: no pain  Session 2: no pain    Therapy/Group: Individual Therapy  Pollyann Glen [facility] 10/17/2022, 12:05 PM

## 2022-10-18 LAB — MRSA NEXT GEN BY PCR, NASAL: MRSA by PCR Next Gen: NOT DETECTED

## 2022-10-18 MED ORDER — CHLORHEXIDINE GLUCONATE CLOTH 2 % EX PADS
6.0000 | MEDICATED_PAD | Freq: Two times a day (BID) | CUTANEOUS | Status: DC
  Administered 2022-10-18 – 2022-10-20 (×4): 6 via TOPICAL

## 2022-10-18 NOTE — Progress Notes (Signed)
Occupational Therapy Session Note  Patient Details  Name: James Estrada MRN: [id number] Date of Birth: [birth year]  Today's Date: 10/18/2022 OT Individual Time: 0902-1001 OT Individual Time Calculation (min): 59 min    Short Term Goals: Week 2:  OT Short Term Goal 1 (Week 2): STGs = LTGs (some goals have been upgraded)  Skilled Therapeutic Interventions/Progress Updates:  Pt greeted supine in bed with wife present, pt agreeable to OT intervention. Session focus on BADL reeducation, functional mobility, dynamic standing balance and decreasing overall caregiver burden. Incorporated wife into session for continued family ed.           Wife assisted pt with functional mobility into walkin shower with pt needing MAX multimodal cues to motor stepping into shower and sitting on novel seat. Pt completed bathing with overall supervision however max cues from wife to wash all parts and attend to L side of body. Pt also standing often during bathing needing cues to stay seated for safety.          Pt exited shower with MINA with Rw for + safety. Education provided on donning non slip socks and drying floor at home to decrease falls, wife agreeable. Pt completed dressing in bathroom on BSC. Set- up for UB dressing, MIN A for LB dressing needing assist to pull pants up on R side.   Pt able to don shoes and socks with set- up assist, pt able to tie R shoe! Needed assist with L shoe. Pt completed functional ambulation to gym with rw and supervision. Worked on standing therapeutic activity of engaging in matching game with pt instructed to flip over 2 blocks at a time and state whether blocks were the same color, pt completed task with overall 40 % accuracy. Pt needed 2 choices when stating which color blocks were. Pt also needed cues to attend to blocks on L side of table.   Ended session with pt seated in recliner with all needs within reach and wife present.            Therapy  Documentation Precautions:  Precautions Precautions: Sternal, Fall Precaution Comments: L inattention Restrictions Weight Bearing Restrictions: No Other Position/Activity Restrictions: sternal precautions - keep hands close to body with mobility    Pain: No pain    Therapy/Group: Individual Therapy  James Estrada 10/18/2022, 12:14 PM

## 2022-10-18 NOTE — Progress Notes (Signed)
Physical Therapy Session Note  Patient Details  Name: James Estrada MRN: [id number] Date of Birth: 22-Jul-1941  Today's Date: 10/18/2022 PT Individual Time: 1502-1548 PT Individual Time Calculation (min): 46 min   Short Term Goals: Week 2:  PT Short Term Goal 1 (Week 2): STG=LTG due to ELOS  Skilled Therapeutic Interventions/Progress Updates:  Patient supine in bed on entrance to room. Patient alert and agreeable to PT session. Relates that he has been resting in bed but unable to sleep. Being in the bed is "annoying" without the ability to sleep. Pt very agreeable to getting OOB and ambulating.   Patient with no pain complaint at start of session.  Therapeutic Activity: Bed Mobility: Pt performed supine --> sit with distant supervision. Seated balance is Mod I. VC/ tc required for for safety and not to stand prior to closer supervision from therapist. Transfers: Pt performed sit<>stand and stand pivot transfers throughout session with supervision. Demos reaching out for therapist and RW to steady self on standing. No lightheadedness reported. Provided verbal cues for push-to-stand technique.  Gait Training/ NMR:  Pt ambulated 150 ft using RW with close supervision prior to initiating . Demonstrated good balance and steadiness using RW with continued difficulty with verbal cueing for directionality and scanning visual field for maneuvering objects. Provided vc followed by addition of visual cue when required throughout session.  6 Min Walk Test:  Instructed patient to ambulate as quickly and as safely as possible for 6 minutes using LRAD. Patient was allowed to take standing rest breaks without stopping the test, but if the patient required a sitting rest break the clock would be stopped and the test would be over.  Results: 885 feet (270 meters, Avg speed 0.75 m/s) using a RW with close supervision. Results indicate that the patient has above average endurance with ambulation  compared to age matched norms.  Age Matched Norms: 36-69 yo M: 21 F: 56, 29-79 yo M: 54 F: 471, 33-89 yo M: 417 F: 392 MDC: 58.21 meters (190.98 feet) or 50 meters (ANPTA Core Set of Outcome Measures for Adults with Neurologic Conditions, 2018)   NMR performed for improvements in motor control and coordination, balance, sequencing, judgement, and self confidence/ efficacy in performing all aspects of mobility at highest level of independence.   Patient seated upright in recliner at end of session with brakes locked, belt alarm set, and all needs within reach.   Therapy Documentation Precautions:  Precautions Precautions: Sternal, Fall Precaution Comments: L inattention Restrictions Weight Bearing Restrictions: No Other Position/Activity Restrictions: sternal precautions - keep hands close to body with mobility General:   Vital Signs:   Pain:  No pain complaint this session. Continues to c/o frustration with inability to perform mental/ cognitive reasoning at 100%. Continued education to rest brain when he can and continue to demo good patience with self.   Therapy/Group: Individual Therapy  Loel Dubonnet PT, DPT, CSRS 10/18/2022, 7:24 PM

## 2022-10-18 NOTE — Progress Notes (Addendum)
Patient ID: James Estrada, male   DOB: [birth year], 81 y.o.   MRN: [id number]  SW met with patient spouse in the room. Family requesting [facility] to [facility] FU. Patient approved to fU PT/Ot/SLP. Orders sent. DME has arrived in patients room: RW, TTB and BSC. Spouse requesting script to cover taxed for stair lift, Sw will discuss with physician no additional questions or concerns.

## 2022-10-18 NOTE — Progress Notes (Signed)
Physical Therapy Session Note  Patient Details  Name: James Estrada MRN: [id number] Date of Birth: [birth year]  Today's Date: 10/18/2022 PT Individual Time: 1118-1204 PT Individual Time Calculation (min): 46 min   Short Term Goals: Week 2:  PT Short Term Goal 1 (Week 2): STG=LTG due to ELOS   Skilled Therapeutic Interventions/Progress Updates:  Patient seated upright in recliner with wife present on entrance to room. Patient alert and agreeable to PT session.   Patient with no pain complaint at start of session.  Therapeutic Activity: Transfers: Pt performed sit<>stand and stand pivot transfers throughout session with push-to-stand technique and supervision. Improved standing balance noted. Provided min vc for hand positioning.  Gait Training/ NMR:  Pt ambulated >200 ft x2 using RW with close supervision. Demonstrated good quality of gait with RW. Provided vc/ tc for directionality. Slow to process and 0% correct with cues to turn L with pt initially turning R with each cue.  Pt guided in stair training with physical demonstration and verbal instructions provided for pt and wife prior to performance. Pt is able to complete twelve 6" steps using SBQC in RUE and HHA to LUE throughout. Pt leads with LLE to ascend and descend on his choosing and with inability to adjust with vc/ tc during performance. VC provided at initiation of each direction for leading LE.   NMR performed for improvements in motor control and coordination, balance, sequencing, judgement, and self confidence/ efficacy in performing all aspects of mobility at highest level of independence.   Patient seated upright in recliner at end of session with brakes locked, belt alarm set, and all needs within reach. Wife in room with CSW.   Therapy Documentation Precautions:  Precautions Precautions: Sternal, Fall Precaution Comments: L inattention Restrictions Weight Bearing Restrictions: No Other Position/Activity  Restrictions: sternal precautions - keep hands close to body with mobility General:   Vital Signs:   Pain:  No pain related this session.   Therapy/Group: Individual Therapy  Loel Dubonnet PT, DPT, CSRS 10/18/2022, 7:22 PM

## 2022-10-18 NOTE — Progress Notes (Addendum)
PROGRESS NOTE   Subjective/Complaints:  No issues overnite , remains aphasic   ROS: limited due to language/communication   Objective:   No results found. Recent Labs    10/17/22 0759  WBC 9.0  HGB 10.1*  HCT 31.8*  PLT 140*     Recent Labs    10/17/22 0759  NA 138  K 3.9  CL 106  CO2 24  GLUCOSE 124*  BUN 15  CREATININE 1.13  CALCIUM 8.4*      Intake/Output Summary (Last 24 hours) at 10/18/2022 0746 Last data filed at 10/17/2022 1610 Gross per 24 hour  Intake 240 ml  Output 600 ml  Net -360 ml         Physical Exam: Vital Signs Blood pressure 122/73, pulse 64, temperature 98.5 F (36.9 C), resp. rate 17, weight 75.6 kg, SpO2 97 %. Physical Exam    General: No acute distress Mood and affect are appropriate Heart: Regular rate and rhythm no rubs murmurs or extra sounds Lungs: Clear to auscultation, breathing unlabored, no rales or wheezes Abdomen: Positive bowel sounds, soft nontender to palpation, nondistended Extremities: No clubbing, cyanosis, or edema Skin: No evidence of breakdown, no evidence of rash    Skin:    General: Skin is warm and dry. Mild ecchymosis nose, better Neurological:     Mental Status:  Oriented to self only.  Language: Fluent but with significant receptive and expressive aphasia. perseveration - is able to communicate thoughts now with extra time and some cueing. Can follow simple commands  +apraxia but improving  4/5 LUE, 5/5 RUE 5/5 RLE 4+ LLE               Assessment/Plan: 1. Functional deficits which require 3+ hours per day of interdisciplinary therapy in a comprehensive inpatient rehab setting. Physiatrist is providing close team supervision and 24 hour management of active medical problems listed below. Physiatrist and rehab team continue to assess barriers to discharge/monitor patient progress toward functional and medical goals  Care  Tool:  Bathing    Body parts bathed by patient: Chest, Abdomen, Face, Left upper leg, Right upper leg, Left arm   Body parts bathed by helper: Right arm, Front perineal area, Buttocks, Right lower leg, Left lower leg     Bathing assist Assist Level: Moderate Assistance - Patient 50 - 74%     Upper Body Dressing/Undressing Upper body dressing   What is the patient wearing?: Pull over shirt    Upper body assist Assist Level: Maximal Assistance - Patient 25 - 49%    Lower Body Dressing/Undressing Lower body dressing      What is the patient wearing?: Incontinence brief, Pants     Lower body assist Assist for lower body dressing: Maximal Assistance - Patient 25 - 49%     Toileting Toileting    Toileting assist Assist for toileting: Moderate Assistance - Patient 50 - 74%     Transfers Chair/bed transfer  Transfers assist  Chair/bed transfer activity did not occur: Safety/medical concerns  Chair/bed transfer assist level: Contact Guard/Touching assist     Locomotion Ambulation   Ambulation assist   Ambulation activity did not occur: Safety/medical concerns (dizziness  and mild diaphoresis on standing)  Assist level: Contact Guard/Touching assist Assistive device: Walker-rolling Max distance: 60 ft   Walk 10 feet activity   Assist  Walk 10 feet activity did not occur: Safety/medical concerns  Assist level: Contact Guard/Touching assist Assistive device: Walker-rolling   Walk 50 feet activity   Assist Walk 50 feet with 2 turns activity did not occur: Safety/medical concerns  Assist level: Minimal Assistance - Patient > 75% Assistive device: Walker-rolling    Walk 150 feet activity   Assist Walk 150 feet activity did not occur: Safety/medical concerns         Walk 10 feet on uneven surface  activity   Assist Walk 10 feet on uneven surfaces activity did not occur: Safety/medical concerns         Wheelchair     Assist Is the patient  using a wheelchair?: Yes Type of Wheelchair: Manual    Wheelchair assist level: Dependent - Patient 0% Max wheelchair distance: 35 ft    Wheelchair 50 feet with 2 turns activity    Assist        Assist Level: Dependent - Patient 0%   Wheelchair 150 feet activity     Assist      Assist Level: Dependent - Patient 0%   Blood pressure 122/73, pulse 64, temperature 98.5 F (36.9 C), resp. rate 17, weight 75.6 kg, SpO2 97 %.    Medical Problem List and Plan: 1. 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  Antithrombotics: -DVT/anticoagulation: If atrial fibrillation recurs, may need to consider AC             -antiplatelet therapy: aspirin 81 mg daily             SCDs and TEDs; consider starting Lovenox 3. Pain Management: Tylenol as needed 4. Mood/Behavior/Sleep:     -celexa for anxiety -neuropsych eval may be limited due to aphasia              -antipsychotic agents: n/a 5. Neuropsych/cognition: This patient is not capable of making decisions on his own behalf. 6. Skin/Wound Care: Routine skin care checks  7. Fluids/Electrolytes/Nutrition: Strict Is and Os and follow-up chemistries             -daily weight             -continue dysphagia 2 diet/thins; SLP eval             -continue feeding supplement  8: Heart failure with pEF (see meds below)          compensated , wts stable  Filed Weights   10/16/22 0358 10/17/22 0443 10/18/22 0500  Weight: 75.6 kg 76 kg 75.6 kg     9: Leukocytosis/leukemoid reaction: resolved  -completed Zosyn, starting Augmentin BID for 10 days thru 12/1                 Latest Ref Rng & Units 10/17/2022    7:59 AM 10/10/2022     5:13 AM 10/07/2022    4:48 AM  CBC  WBC 4.0 - 10.5 K/uL  9.0  10.4  15.1   Hemoglobin 13.0 - 17.0 g/dL 10.1  9.0  8.7   Hematocrit 39.0 - 52.0 % 31.8  29.3  27.6   Platelets 150 - 400 K/uL 140  247  378     10: Post-op atrial fibrillation:  -continue amiodarone 200 mg BID for 7 days then 200 mg daily             -if recurs, consider AC  - HR well controlled; monitor    10/18/2022    5:00 AM 10/18/2022    4:35 AM 10/17/2022    8:13 PM  Vitals with BMI  Weight 166 lbs 11 oz    Systolic  [id number]  Diastolic  73 83  Pulse  64 73     11: Left renal artery stenosis: follow-up as outpatient 12: Acute blood loss anemia: follow-up CBC  - 12/4 stable today at 10.1 HGB 13: Hyperlipidemia: continue Lipitor 40 mg daily 14: GERD/HH: no meds (Pepcid discontinued) 15: Hypertension: montior TID and prn             -continue Lopressor 12.5 mg BID             -restart losartan 50 mg daily, Spironolactone 12.5 mg as BP allows   Vitals:   10/17/22 2013 10/18/22 0435  BP: 113/83 122/73  Pulse: 73 64  Resp: 18 17  Temp: 98.5 F (36.9 C)   SpO2: 98% 97%  Well controlled 12/5 16: Acute urinary retention:             -has foley cath - Dced on admission, sarting PVRs Q6H for voiding trial             -continue Flomax 0.4 mg daily -            -failed voiding trial x 1 , home with foley   17: Acute kidney injury: serum creatinine improving; follow-up BMP    Latest Ref Rng & Units 10/17/2022    7:59 AM 10/10/2022    5:13 AM 10/07/2022    4:48 AM  BMP  Glucose 70 - 99 mg/dL 124  110  113   BUN 8 - 23 mg/dL 15  17  18    Creatinine 0.61 - 1.24 mg/dL 1.13  1.13  1.22   Sodium 135 - 145 mmol/L 138  137  141   Potassium 3.5 - 5.1 mmol/L 3.9  4.0  3.8   Chloride 98 - 111 mmol/L 106  106  103   CO2 22 - 32 mmol/L 24  23  24    Calcium 8.9 - 10.3 mg/dL 8.4  8.2  8.7   12/4 Cr/Bun stable  19. Constipation           - LBM 11/30            - Added Senokot-S 1 tab QHS  12/4 LBM   LOS: 14  days A FACE TO FACE EVALUATION WAS PERFORMED  Charlett Blake 10/18/2022, 7:46 AM

## 2022-10-18 NOTE — Progress Notes (Signed)
Speech Language Pathology Daily Session Note  Patient Details  Name: James Estrada MRN: [id number] Date of Birth: 06/19/1941  Today's Date: 10/18/2022 SLP Individual Time: 1000-1100 SLP Individual Time Calculation (min): 60 min  Short Term Goals: Week 2: SLP Short Term Goal 1 (Week 2): STG=LTG due to ELOS  Skilled Therapeutic Interventions: Skilled ST treatment focused on language goals and education with spouse. SLP facilitated session by providing education on strategies to maximize comprehension and expression within natural environment. SLP emphasized open ended questions vs. close ended questions, providing field of choices, topic cues, asking yes/no questions, phonemic and semantic cues, sentence completion cues, providing extended processing time, verbal repetition, and minimizing environmental distractions. Spouse verbalized understanding through teach back.   SLP facilitated Occupational psychologist (VNeST) with overall mod A verbal cues to generate answers to [facility] questions in order to generate a complete sentence. Pt verbally read at the sentence level with 90% accuracy given sup A verbal cues.   Pt completed divergent naming task with overall max fading to mod A verbal/visual cues for word finding after being given examples, semantic cues, and sentence completion cues.   Pt communicated functional needs and preferences throughout session with min A verbal cues.    Pt's spouse inquired about private speech therapy services in the home with concern they may not be approved for [facility] while considering insurance reportedly denied care for [facility]. SLP reassured pt/family that SW is working toward identifying other HH options to support PT/OT/SLP needs.   Patient was left in recliner with alarm activated and immediate needs within reach at end of session. Continue per current plan of care.      Pain  None/denied  Therapy/Group: Individual Therapy  Tamala Ser 10/18/2022, 12:58 PM

## 2022-10-19 MED ORDER — INFLUENZA VAC A&B SA ADJ QUAD 0.5 ML IM PRSY
0.5000 mL | PREFILLED_SYRINGE | INTRAMUSCULAR | Status: AC
  Administered 2022-10-19: 0.5 mL via INTRAMUSCULAR
  Filled 2022-10-19: qty 0.5

## 2022-10-19 MED ORDER — INFLUENZA VAC A&B SA ADJ QUAD 0.5 ML IM PRSY: 0.5000 mL | PREFILLED_SYRINGE | INTRAMUSCULAR | Status: DC

## 2022-10-19 NOTE — Plan of Care (Signed)
  Problem: RH Balance Goal: LTG Patient will maintain dynamic standing balance (PT) Description: LTG:  Patient will maintain dynamic standing balance with assistance during mobility activities (PT) Outcome: Adequate for Discharge Flowsheets (Taken 10/19/2022 1747) LTG: Pt will maintain dynamic standing balance during mobility activities with:: Contact Guard/Touching assist   Problem: Sit to Stand Goal: LTG:  Patient will perform sit to stand with assistance level (PT) Description: LTG:  Patient will perform sit to stand with assistance level (PT) Outcome: Completed/Met Flowsheets (Taken 10/05/2022 1631) LTG: PT will perform sit to stand in preparation for functional mobility with assistance level: Supervision/Verbal cueing   Problem: RH Bed to Chair Transfers Goal: LTG Patient will perform bed/chair transfers w/assist (PT) Description: LTG: Patient will perform bed to chair transfers with assistance (PT). Outcome: Completed/Met Flowsheets (Taken 10/05/2022 1631) LTG: Pt will perform Bed to Chair Transfers with assistance level: Supervision/Verbal cueing   Problem: RH Car Transfers Goal: LTG Patient will perform car transfers with assist (PT) Description: LTG: Patient will perform car transfers with assistance (PT). Outcome: Completed/Met Flowsheets (Taken 10/05/2022 1631) LTG: Pt will perform car transfers with assist:: Contact Guard/Touching assist   Problem: RH Furniture Transfers Goal: LTG Patient will perform furniture transfers w/assist (OT/PT) Description: LTG: Patient will perform furniture transfers  with assistance (OT/PT). Outcome: Completed/Met Flowsheets (Taken 10/05/2022 1631) LTG: Pt will perform furniture transfers with assist:: Contact Guard/Touching assist   Problem: RH Ambulation Goal: LTG Patient will ambulate in controlled environment (PT) Description: LTG: Patient will ambulate in a controlled environment, # of feet with assistance (PT). Outcome:  Completed/Met Flowsheets (Taken 10/05/2022 1631) LTG: Pt will ambulate in controlled environ  assist needed:: Supervision/Verbal cueing LTG: Ambulation distance in controlled environment: 100 ft using LRAD Goal: LTG Patient will ambulate in home environment (PT) Description: LTG: Patient will ambulate in home environment, # of feet with assistance (PT). Outcome: Completed/Met Flowsheets (Taken 10/05/2022 1631) LTG: Pt will ambulate in home environ  assist needed:: Contact Guard/Touching assist LTG: Ambulation distance in home environment: at least 50 ft using LRAD   Problem: RH Stairs Goal: LTG Patient will ambulate up and down stairs w/assist (PT) Description: LTG: Patient will ambulate up and down # of stairs with assistance (PT) Outcome: Completed/Met Flowsheets (Taken 10/05/2022 1631) LTG: Pt will ambulate up/down stairs assist needed:: Contact Guard/Touching assist LTG: Pt will  ambulate up and down number of stairs: at least 3 steps with HR setup as per home environment

## 2022-10-19 NOTE — Progress Notes (Incomplete)
Inpatient Rehabilitation Discharge Medication Review by a Pharmacist  A complete drug regimen review was completed for this patient to identify any potential clinically significant medication issues.  High Risk Drug Classes Is patient taking? Indication by Medication  Antipsychotic {Receiving?:26196}   Anticoagulant {Receiving?:26196}   Antibiotic {Receiving?:26196}   Opioid {Receiving?:26196} Tramadol - PRN pain ***  Antiplatelet {Receiving?:26196} ASA - CAD  Hypoglycemics/insulin {Yes or No?:26198}   Vasoactive Medication {Receiving?:26196} Amiodarone, metoprolol - afib  Chemotherapy {Receiving Chemo?:26197}   Other {Yes or No?:26198} Atorvastatin - HLD Citalopram - mood Senokot-S - constipation Tamsulosin, doxazosin - BPH, urinary hesitancy Trazodone - PRN sleep ***     Type of Medication Issue Identified Description of Issue Recommendation(s)  Drug Interaction(s) (clinically significant)     Duplicate Therapy     Allergy     No Medication Administration End Date     Incorrect Dose     Additional Drug Therapy Needed     Significant med changes from prior encounter (inform family/care partners about these prior to discharge). No medications PTA, all medications are new at discharge Communicate medication changes with patient/family at discharge  Other       Clinically significant medication issues were identified that warrant physician communication and completion of prescribed/recommended actions by midnight of the next day:  {Yes or No?:26198}  Name of provider notified for urgent issues identified: ***   Provider Method of Notification: ***    Pharmacist comments: ***   Time spent performing this drug regimen review (minutes): 20   Thank you for allowing pharmacy to be a part of this patient's care.  Thelma Barge, PharmD Clinical Pharmacist

## 2022-10-19 NOTE — Patient Care Conference (Signed)
Inpatient RehabilitationTeam Conference and Plan of Care Update Date: 10/19/2022   Time: 10:22 AM    Patient Name: James Estrada      Medical Record Number: [id number]  Date of Birth: 1941/03/16 Sex: Male         Room/Bed: 4W12C/4W12C-02 Payor Info: Payor: [employer]ICARE / Plan: BCBS MEDICARE / Product Type: *No Product type* /    Admit Date/Time:  10/04/2022  4:08 PM  Primary Diagnosis:  CVA (cerebral vascular accident) [facility])  Hospital Problems: Principal Problem:   CVA (cerebral vascular accident) [facility])    Expected Discharge Date: Expected Discharge Date: 10/20/22  Team Members Present: Physician leading conference: Dr. Claudette Laws Social Worker Present: Lavera Guise, BSW Nurse Present: Chana Bode, RN PT Present: Ralph Leyden, PT OT Present: Primitivo Gauze, OT SLP Present: Eilene Ghazi, SLP PPS Coordinator present : Fae Pippin, SLP     Current Status/Progress Goal Weekly Team Focus  Bowel/Bladder   Pt has indwelling foley catheter and is continent of bowel   Will continue foley care and Pt will remain continent of bowel/bladder   Will assess qshift and PRN    Swallow/Nutrition/ Hydration   reg diet with thin liquids, sup A   sup A  tolerance of current diet with implementation of safe swallowing precautions    ADL's   supervision for bathing from shower level, set- up for UB dressing, MIN A for LB dressing, supervision for ambulatory ADL transfers with Rw, L inattention improving but continues to require MIN A cues during ADLs   CGA  with standing, LB dressing, toileting;   min A bathing and shower transfers   ADL training, LUE NMR, visual attention and awareness of L side, balance, pt /fam education    Mobility   Improving cognition noted with continued difficulty with directionality to L and requring extra time and processing with improved but continued difficulty with word finding. Bed mobility = supervision/ ModI, Transfers  = close supervision for balance. Gait improving to much longer distances with improving quality/ balance as well as activity tolerance.   supervision/ CGA overall  L NMR, dynamic standing balance, continued progression of transfer LOA, comprehension, L awareness and scan of visual field, continue family education. D/C set for Thu.    Communication   min-to-mod A basic expression/comprehension   min-to-mod A   education with family, multimodal communication, word finding, following commands, reading at sentence level    Safety/Cognition/ Behavioral Observations  not addressing            Pain   Pt denies pain   Pt will remain pain free   Will assess qshift and PRN    Skin   Pt's skin is intact   Pt's skin will remain intact  Will assess qshift and PRN      Discharge Planning:  Discharging home on Thurs. DME in place. HH arranged.   Team Discussion: Patient ready for discharge; replace coude' catheter for discharge and HH RN will need to exchange prior to GU follow up appointment.  Constipation addressed.  Patient on target to meet rehab goals: yes, currently supervision - CGA overall. Needs supervision for swallowing regular thin diet/meds crushed. Needs mi - mod assist for expression and comprehension post CVA.  *See Care Plan and progress notes for long and short-term goals.   Revisions to Treatment Plan:  Foley changed out prior to discharge GU follow up appointment for urinary retention   Teaching Needs: Safety, appropriate cues, medications, secondary  risk management, foley care/peri care, transfers, toileting, etc.  Current Barriers to Discharge: Decreased caregiver support  Possible Resolutions to Barriers: Family education HH follow up services DME: RW/WC and TTB     Medical Summary Current Status: aphasic and apraxic, remains with urinary retention, sternal precautions     Possible Resolutions to [employer] Focus: plan d/c in am , needs foley  replacement and flu shot PTA, HHRN PT f/u   Continued Need for Acute Rehabilitation Level of Care: The patient requires daily medical management by a physician with specialized training in physical medicine and rehabilitation for the following reasons: Direction of a multidisciplinary physical rehabilitation program to maximize functional independence : Yes Medical management of patient stability for increased activity during participation in an intensive rehabilitation regime.: Yes Analysis of laboratory values and/or radiology reports with any subsequent need for medication adjustment and/or medical intervention. : Yes   I attest that I was present, lead the team conference, and concur with the assessment and plan of the team.   Chana Bode B 10/19/2022, 4:10 PM

## 2022-10-19 NOTE — Progress Notes (Signed)
Occupational Therapy Discharge Summary  Patient Details  Name: James Estrada MRN: [id number] Date of Birth: [birth year]  Date of Discharge from OT service:October 19, 2022     Patient has met 11 of 11 long term goals due to improved balance, postural control, ability to compensate for deficits, functional use of  LEFT upper and LEFT lower extremity, improved attention, improved awareness, and improved coordination.  Patient to discharge at overall Supervision level.  Patient's care partner is independent to provide the necessary physical and cognitive assistance at discharge.  Family education has been completed with daughter and spouse.   Reasons goals not met: n/a  Recommendation:  Patient will benefit from ongoing skilled OT services in home health setting to continue to advance functional skills in the area of BADL and iADL.  Equipment: Radio broadcast assistant, BSC   Reasons for discharge: treatment goals met  Patient/family agrees with progress made and goals achieved: Yes  OT Discharge Precautions/Restrictions  Precautions Precautions: Sternal;Fall Precaution Comments: L inattention Restrictions Other Position/Activity Restrictions: sternal precautions - keep hands close to body with mobility ADL ADL Eating: Independent Grooming: Supervision/safety, Minimal assistance Upper Body Bathing: Moderate cueing, Supervision/safety Where Assessed-Upper Body Bathing: Shower Lower Body Bathing: Supervision/safety, Moderate cueing Where Assessed-Lower Body Bathing: Shower Upper Body Dressing: Setup Where Assessed-Upper Body Dressing: Chair Lower Body Dressing: Supervision/safety Where Assessed-Lower Body Dressing: Chair Toileting: Supervision/safety Where Assessed-Toileting: Glass blower/designer: Close supervision, Minimal verbal cueing Toilet Transfer Method: Counselling psychologist: Raised toilet seat Tub/Shower Transfer: Moderate cueing, Minimal  assistance Tub/Shower Transfer Method: Ambulating Tub/Shower Equipment: Facilities manager: Minimal assistance, Moderate cueing Social research officer, government Method: Heritage manager: Grab bars, Shower seat with back Vision Baseline Vision/History: 1 Wears glasses (reading glasses) Patient Visual Report: Peripheral vision impairment Vision Assessment?: Yes Eye Alignment: Within Functional Limits Ocular Range of Motion: Within Functional Limits Alignment/Gaze Preference: Within Defined Limits Tracking/Visual Pursuits: Decreased smoothness of eye movement to LEFT superior field;Decreased smoothness of eye movement to LEFT inferior field Saccades: Within functional limits (needs max cues to follow directions) Convergence: Within functional limits Visual Fields: Left visual field deficit Additional Comments: improved ability to follow directions but continues to need max cues to do visual assessment Perception  Perception: Impaired Inattention/Neglect: Does not attend to left visual field;Does not attend to left side of body Praxis Praxis: Intact Cognition Cognition Overall Cognitive Status: Impaired/Different from baseline Arousal/Alertness: Awake/alert Orientation Level: Person;Place;Situation Person: Oriented Place: Oriented Situation: Oriented Memory: Impaired Memory Impairment: Retrieval deficit Attention: Focused;Sustained;Selective Focused Attention: Appears intact Sustained Attention: Appears intact Selective Attention: Impaired Selective Attention Impairment: Verbal basic;Functional basic Awareness: Impaired Awareness Impairment: Emergent impairment Problem Solving: Impaired Problem Solving Impairment: Verbal basic;Functional basic Sequencing: Impaired Sequencing Impairment: Functional basic Initiating: Impaired Initiating Impairment: Functional basic Behaviors: Impulsive Safety/Judgment: Impaired Brief Interview for Mental  Status (BIMS) Repetition of Three Words (First Attempt): 3 Temporal Orientation: Year: Missed by 2 to 5 years Temporal Orientation: Month: Missed by more than 1 month Temporal Orientation: Day: Incorrect Recall: "Sock": No, could not recall Recall: "Blue": Yes, no cue required Recall: "Bed": No, could not recall BIMS Summary Score: 6 Sensation Sensation Light Touch: Impaired by gross assessment Hot/Cold: Impaired by gross assessment Proprioception: Impaired by gross assessment Stereognosis: Impaired by gross assessment Coordination Gross Motor Movements are Fluid and Coordinated: No Fine Motor Movements are Fluid and Coordinated: No Coordination and Movement Description: decreased motor control of LUE, limited grasp but can now tie shoes and open containers with extra  time Motor   Minimal L hemiplegia -weakness in hand     Trunk/Postural Assessment    Slight left lean in standing but is CGA with dynamic balance Balance Static Sitting Balance Static Sitting - Level of Assistance: 7: Independent Dynamic Sitting Balance Dynamic Sitting - Level of Assistance: 5: Stand by assistance Static Standing Balance Static Standing - Level of Assistance: 5: Stand by assistance Dynamic Standing Balance Dynamic Standing - Level of Assistance: 4: Min assist Extremity/Trunk Assessment RUE Assessment RUE Assessment: Within Functional Limits General Strength Comments: 60 lbs grasp, 15 lbs lat pinch LUE Assessment Passive Range of Motion (PROM) Comments: WFL Active Range of Motion (AROM) Comments: [facility] Strength Comments: shoulder 4-/5 , grasp 22 lbs, lateral pinch 8 lbs   Hira Trent 10/19/2022, 1:23 PM

## 2022-10-19 NOTE — Progress Notes (Signed)
Patient ID: James Estrada, male   DOB: June 22, 1941, 81 y.o.   MRN: [id number]  Team Conference Report to Patient/Family  Team Conference discussion was reviewed with the patient and caregiver, including goals, any changes in plan of care and target discharge date.  Patient and caregiver express understanding and are in agreement.  The patient has a target discharge date of 10/20/22.  SW met with patient and spouse in room and provided team conference updates. Prescription for stair lift provided to patient spouse. Patient medially ready for discharge tomorrow. All DME has been delivered patient set with [facility] for [facility] follow up. No additional questions or concerns. Dyanne Iha 10/19/2022, 1:49 PM

## 2022-10-19 NOTE — Progress Notes (Signed)
Occupational Therapy Session Note  Patient Details  Name: James Estrada MRN: [id number] Date of Birth: 10-18-1941  Today's Date: 10/19/2022 OT Individual Time: 0915-1000 and 1130-1210 OT Individual Time Calculation (min): 45 min and 40 min    Short Term Goals: Week 2:  OT Short Term Goal 1 (Week 2): STGs = LTGs (some goals have been upgraded)  Skilled Therapeutic Interventions/Progress Updates:    Visit 1: Pain: no c/o pain  Pt received in room with wife present.  Pt declined a shower and was already dressed, but did need to work on donning socks and shoes. He can do this task without physical A but does need cuing. For example he put wrong shoe on foot and not aware.   Worked on reassessment of visual skills, due to difficulty following directions had to modify the assessment, but it does appear pt has a 45 degree field cut on the left and continues to demonstrate L visual attention. He needs cues to turn head to Left, scan and visually look to left to find items or a chair to sit in.  If on R side, not as challenging for pt to locate objects.   Continued fam ed with spouse on cuing strategies, giving pt time to process, how to cue without over helping, forced use of L hand.   Pt resting in recliner with all needs met.   Visit 2: Pain: no c/o pain  Pt received in room with wife. Pt ambulated to ADL apt with wife attending therapy. Mod cues to scan L to turn left to navigate in hallway.   In apt, pt practiced sitting down in rocking recliner. Did well with positioning of body for safe descent.  Showed wife how to set up tub bench and how to remove back rest of bench if she needed.   Pt then ambulated to gym. Assessed current L hand strength. Developing grasp of 22 lb, decreased finger extension strength. Provided with soft yellow theraputty and pt practiced 2 exercises to work on finger extension.   Pt then ambulated back to room. Reviewed other home exercises (playing piano on fingers  for [facility], balloon toss for visual scanning).  Reviewed the need for close S and cues.  Wife provided with a handout.  Pt resting in room with all needs met.   Therapy Documentation Precautions:  Precautions Precautions: Sternal, Fall Precaution Comments: L inattention Restrictions Weight Bearing Restrictions: No Other Position/Activity Restrictions: sternal precautions - keep hands close to body with mobility     ADL: ADL Eating: Independent Grooming: Supervision/safety, Minimal assistance Upper Body Bathing: Moderate cueing, Supervision/safety Where Assessed-Upper Body Bathing: Shower Lower Body Bathing: Supervision/safety, Moderate cueing Where Assessed-Lower Body Bathing: Shower Upper Body Dressing: Setup Where Assessed-Upper Body Dressing: Chair Lower Body Dressing: Supervision/safety Where Assessed-Lower Body Dressing: Chair Toileting: Supervision/safety Where Assessed-Toileting: Glass blower/designer: Close supervision, Minimal verbal cueing Toilet Transfer Method: Counselling psychologist: Raised toilet seat Tub/Shower Transfer: Moderate cueing, Minimal assistance Tub/Shower Transfer Method: Ambulating Tub/Shower Equipment: Facilities manager: Minimal assistance, Moderate cueing Social research officer, government Method: Heritage manager: Grab bars, Shower seat with back  Therapy/Group: Individual Therapy  Lonnette Shrode 10/19/2022, 1:15 PM

## 2022-10-19 NOTE — Plan of Care (Signed)
  Problem: RH Swallowing Goal: LTG Patient will consume least restrictive diet using compensatory strategies with assistance (SLP) Description: LTG:  Patient will consume least restrictive diet using compensatory strategies with assistance (SLP) Outcome: Completed/Met Goal: LTG Pt will demonstrate functional change in swallow as evidenced by bedside/clinical objective assessment (SLP) Description: LTG: Patient will demonstrate functional change in swallow as evidenced by bedside/clinical objective assessment (SLP) Outcome: Completed/Met   Problem: RH Comprehension Communication Goal: LTG Patient will comprehend basic/complex auditory (SLP) Description: LTG: Patient will comprehend basic/complex auditory information with cues (SLP). Outcome: Completed/Met   Problem: RH Expression Communication Goal: LTG Patient will express needs/wants via multi-modal(SLP) Description: LTG:  Patient will express needs/wants via multi-modal communication (gestures/written, etc) with cues (SLP) Outcome: Completed/Met Goal: LTG Patient will verbally express basic/complex needs(SLP) Description: LTG:  Patient will verbally express basic/complex needs, wants or ideas with cues  (SLP) Outcome: Completed/Met Goal: LTG Patient will increase word finding of common (SLP) Description: LTG:  Patient will increase word finding of common objects/daily info/abstract thoughts with cues using compensatory strategies (SLP). Outcome: Completed/Met

## 2022-10-19 NOTE — Progress Notes (Signed)
Speech Language Pathology Discharge Summary  Patient Details  Name: James Estrada MRN: [id number] Date of Birth: [birth year]  Date of Discharge from [place] service:October 19, 2022  Today's Date: 10/19/2022 SLP Individual Time: 0800-0900 SLP Individual Time Calculation (min): 60 min  Skilled Therapeutic Interventions:  Skilled ST treatment focused on language goals. Pt was greeted in bed on arrival. Pt completed upper body dressing with min A verbal cues to take off old shirt before putting on new/clean shirt. Pt ambulated to sink and completed oral care with sup A verbal cues for initiating and clean up. SLP re-assessed expressive/receptive language skills with the MS aphasia screening test. Pt demonstrated improvement in all tested areas as seen below:   MS Aphasia Screening Test - results compared to initial assessment obtained on 10/04/22 Naming: 10/10 (previously 6/10) Automatic Speech: 9/10 (previously 8/10) Repetition: 10/10  (previously 10/10) Yes/No Responses: 18/20  (previously 10/20) Following Instructions: 2/10 (previously 0/10) Reading: 0/10 (previously 0/10) - task required pt to read commands at the short sentence level and then execute command. Pt was able to read command successfully with 80% accuracy, however unable to execute command d/t motor planning deficits and comprehension deficits impacting understanding of task expectations.   Discussed pt's steady progress with pt and spouse and reinforced communication strategies from the perspective of the patient and family member. Pt and spouse verbalized understanding and looking forward to additional speech therapy services in home health setting.   Patient was left EOB with spouse at bedside with alarm activated and immediate needs within reach at end of session. Continue per current plan of care.    Patient has met 6 of 6 long term goals.  Patient to discharge at overall Min;Mod level.  Reasons goals not met: None    Clinical Impression/Discharge Summary: Patient has made excellent progress and has met 6 of 6 long-term goals this admission due to improved expressive/receptive language, and oral swallow function. Pt is currently communicating functional needs and comprehending basic-to-mildly complex auditory information with min-to-mod A multimodal cues. Pt is currently consuming a regular texture diet with thin liquids with supervision A verbal cues for self feeding and to take small bite sizes. Pt prefers to continue crushed medications at discharge. Patient and family education is complete and patient to discharge at overall min-to-mod A level. Patient's care partner is independent to provide the necessary physical and cognitive assistance at discharge. Patient would benefit from continued SLP services in home health setting to maximize language/cognitive-linguistic function and functional independence.   Care Partner:  Caregiver Able to Provide Assistance: Yes  Type of Caregiver Assistance: Cognitive;Physical  Recommendation:  Home Health SLP;24 hour supervision/assistance  Rationale for SLP Follow Up: Maximize functional communication;Maximize cognitive function and independence;Reduce caregiver burden   Equipment: None   Reasons for discharge: Treatment goals met   Patient/Family Agrees with Progress Made and Goals Achieved: Yes    Jessiah Steinhart T Ninetta Adelstein 10/19/2022, 10:37 AM

## 2022-10-19 NOTE — Progress Notes (Signed)
PROGRESS NOTE   Subjective/Complaints:  Pt needs stair lift for safe locomotion up and down steps  Discussed no driving  Ok for Flu shot   ROS: limited due to language/communication   Objective:   No results found. Recent Labs    10/17/22 0759  WBC 9.0  HGB 10.1*  HCT 31.8*  PLT 140*     Recent Labs    10/17/22 0759  NA 138  K 3.9  CL 106  CO2 24  GLUCOSE 124*  BUN 15  CREATININE 1.13  CALCIUM 8.4*      Intake/Output Summary (Last 24 hours) at 10/19/2022 0934 Last data filed at 10/19/2022 0443 Gross per 24 hour  Intake 476 ml  Output 1050 ml  Net -574 ml         Physical Exam: Vital Signs Blood pressure 123/76, pulse 65, temperature (!) 97 F (36.1 C), resp. rate 16, weight 75.4 kg, SpO2 98 %. Physical Exam    General: No acute distress Mood and affect are appropriate Heart: Regular rate and rhythm no rubs murmurs or extra sounds Lungs: Clear to auscultation, breathing unlabored, no rales or wheezes Abdomen: Positive bowel sounds, soft nontender to palpation, nondistended Extremities: No clubbing, cyanosis, or edema Skin: No evidence of breakdown, no evidence of rash    Skin:    General: Skin is warm and dry. Mild ecchymosis nose, better Neurological:     Mental Status:  Oriented to self only.  Language: Fluent but with significant receptive and expressive aphasia. perseveration - is able to communicate thoughts now with extra time and some cueing. Can follow simple commands  +apraxia but improving  4/5 LUE, 5/5 RUE 5/5 RLE 4+ LLE               Assessment/Plan: 1. Functional deficits which require 3+ hours per day of interdisciplinary therapy in a comprehensive inpatient rehab setting. Physiatrist is providing close team supervision and 24 hour management of active medical problems listed below. Physiatrist and rehab team continue to assess barriers to discharge/monitor  patient progress toward functional and medical goals  Care Tool:  Bathing    Body parts bathed by patient: Chest, Abdomen, Face, Left upper leg, Right upper leg, Left arm, Right arm, Front perineal area, Buttocks, Right lower leg, Left lower leg   Body parts bathed by helper: Right arm, Front perineal area, Buttocks, Right lower leg, Left lower leg     Bathing assist Assist Level: Supervision/Verbal cueing     Upper Body Dressing/Undressing Upper body dressing   What is the patient wearing?: Pull over shirt    Upper body assist Assist Level: Set up assist    Lower Body Dressing/Undressing Lower body dressing      What is the patient wearing?: Incontinence brief, Pants     Lower body assist Assist for lower body dressing: Minimal Assistance - Patient > 75%     Toileting Toileting    Toileting assist Assist for toileting: Moderate Assistance - Patient 50 - 74%     Transfers Chair/bed transfer  Transfers assist  Chair/bed transfer activity did not occur: Safety/medical concerns  Chair/bed transfer assist level: Contact Guard/Touching assist  Locomotion Ambulation   Ambulation assist   Ambulation activity did not occur: Safety/medical concerns (dizziness and mild diaphoresis on standing)  Assist level: Contact Guard/Touching assist Assistive device: Walker-rolling Max distance: 60 ft   Walk 10 feet activity   Assist  Walk 10 feet activity did not occur: Safety/medical concerns  Assist level: Contact Guard/Touching assist Assistive device: Walker-rolling   Walk 50 feet activity   Assist Walk 50 feet with 2 turns activity did not occur: Safety/medical concerns  Assist level: Minimal Assistance - Patient > 75% Assistive device: Walker-rolling    Walk 150 feet activity   Assist Walk 150 feet activity did not occur: Safety/medical concerns         Walk 10 feet on uneven surface  activity   Assist Walk 10 feet on uneven surfaces  activity did not occur: Safety/medical concerns         Wheelchair     Assist Is the patient using a wheelchair?: Yes Type of Wheelchair: Manual    Wheelchair assist level: Dependent - Patient 0% Max wheelchair distance: 35 ft    Wheelchair 50 feet with 2 turns activity    Assist        Assist Level: Dependent - Patient 0%   Wheelchair 150 feet activity     Assist      Assist Level: Dependent - Patient 0%   Blood pressure 123/76, pulse 65, temperature (!) 97 F (36.1 C), resp. rate 16, weight 75.4 kg, SpO2 98 %.    Medical Problem List and Plan: 1. 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 Formulized individual treatment plan based on medical history, underlying problem and comorbidities.   2.  Antithrombotics: -DVT/anticoagulation: If atrial fibrillation recurs, may need to consider AC             -antiplatelet therapy: aspirin 81 mg daily             SCDs and TEDs; consider starting Lovenox 3. Pain Management: Tylenol as needed 4. Mood/Behavior/Sleep:     -celexa for anxiety -neuropsych eval may be limited due to aphasia              -antipsychotic agents: n/a 5. Neuropsych/cognition: This patient is not capable of making decisions on his own behalf. 6. Skin/Wound Care: Routine skin care checks  7. Fluids/Electrolytes/Nutrition: Strict Is and Os and follow-up chemistries             -daily weight             -continue dysphagia 2 diet/thins; SLP eval             -continue feeding supplement  8: Heart failure with pEF (see meds  below)          compensated , wts stable  Filed Weights   10/17/22 0443 10/18/22 0500 10/19/22 0500  Weight: 76 kg 75.6 kg 75.4 kg     9: Leukocytosis/leukemoid reaction: resolved  -completed Zosyn, starting Augmentin BID for 10 days thru 12/1  Latest Ref Rng & Units 10/17/2022    7:59 AM 10/10/2022    5:13 AM 10/07/2022    4:48 AM  CBC  WBC 4.0 - 10.5 K/uL 9.0  10.4  15.1   Hemoglobin 13.0 - 17.0 g/dL 10.1  9.0  8.7   Hematocrit 39.0 - 52.0 % 31.8  29.3  27.6   Platelets 150 - 400 K/uL 140  247  378     10: Post-op atrial fibrillation:  -continue amiodarone 200 mg BID for 7 days then 200 mg daily             -if recurs, consider AC  - HR well controlled; monitor    10/19/2022    5:00 AM 10/19/2022    4:40 AM 10/18/2022    8:24 PM  Vitals with BMI  Weight 166 lbs 4 oz    Systolic  200 379  Diastolic  76 62  Pulse  65 63     11: Left renal artery stenosis: follow-up as outpatient 12: Acute blood loss anemia: follow-up CBC  - 12/4 stable today at 10.1 HGB 13: Hyperlipidemia: continue Lipitor 40 mg daily 14: GERD/HH: no meds (Pepcid discontinued) 15: Hypertension: montior TID and prn             -continue Lopressor 12.5 mg BID             -restart losartan 50 mg daily, Spironolactone 12.5 mg as BP allows   Vitals:   10/18/22 2024 10/19/22 0440  BP: (!) 140/62 123/76  Pulse: 63 65  Resp:  16  Temp: 99 F (37.2 C) (!) 97 F (36.1 C)  SpO2:  98%  Well controlled 12/6 16: Acute urinary retention:             -has foley cath - Dced on admission, sarting PVRs Q6H for voiding trial             -continue Flomax 0.4 mg daily -            -failed voiding trial x 1 , home with foley , change prior to discharge Pt has appt with [facility], Dr Louis Meckel 11/29/22 , so HHRN will need to change cath in about 3wks   17: Acute kidney injury: serum creatinine improving; follow-up BMP    Latest Ref Rng & Units 10/17/2022    7:59 AM 10/10/2022    5:13 AM  10/07/2022    4:48 AM  BMP  Glucose 70 - 99 mg/dL 124  110  113   BUN 8 - 23 mg/dL _0 Creatinine 0.61 - 1.24 mg/dL 1.13  1.13  1.22   Sodium 135 - 145 mmol/L 138  137  141   Potassium 3.5 - 5.1 mmol/L 3.9  4.0  3.8   Chloride 98 - 111 mmol/L 106  106  103   CO2 22 - 32 mmol/L _1 Calcium 8.9 - 10.3 mg/dL 8.4  8.2  8.7   12/4 Cr/Bun stable  19. Constipation             - Added Senokot-S 1 tab QHS  12/5 LBM   LOS: 15 days A FACE TO FACE EVALUATION WAS PERFORMED  Charlett Blake 10/19/2022, 9:34 AM

## 2022-10-19 NOTE — Plan of Care (Signed)
  Problem: RH Balance Goal: LTG Patient will maintain dynamic standing with ADLs (OT) Description: LTG:  Patient will maintain dynamic standing balance with assist during activities of daily living (OT)  Outcome: Completed/Met   Problem: Sit to Stand Goal: LTG:  Patient will perform sit to stand in prep for activites of daily living with assistance level (OT) Description: LTG:  Patient will perform sit to stand in prep for activites of daily living with assistance level (OT) Outcome: Completed/Met   Problem: RH Eating Goal: LTG Patient will perform eating w/assist, cues/equip (OT) Description: LTG: Patient will perform eating with assist, with/without cues using equipment (OT) Outcome: Completed/Met   Problem: RH Grooming Goal: LTG Patient will perform grooming w/assist,cues/equip (OT) Description: LTG: Patient will perform grooming with assist, with/without cues using equipment (OT) Outcome: Completed/Met   Problem: RH Bathing Goal: LTG Patient will bathe all body parts with assist levels (OT) Description: LTG: Patient will bathe all body parts with assist levels (OT) Outcome: Completed/Met   Problem: RH Dressing Goal: LTG Patient will perform upper body dressing (OT) Description: LTG Patient will perform upper body dressing with assist, with/without cues (OT). Outcome: Completed/Met Goal: LTG Patient will perform lower body dressing w/assist (OT) Description: LTG: Patient will perform lower body dressing with assist, with/without cues in positioning using equipment (OT) Outcome: Completed/Met   Problem: RH Toileting Goal: LTG Patient will perform toileting task (3/3 steps) with assistance level (OT) Description: LTG: Patient will perform toileting task (3/3 steps) with assistance level (OT)  Outcome: Completed/Met   Problem: RH Functional Use of Upper Extremity Goal: LTG Patient will use RT/LT upper extremity as a (OT) Description: LTG: Patient will use right/left upper  extremity as a stabilizer/gross assist/diminished/nondominant/dominant level with assist, with/without cues during functional activity (OT) Outcome: Completed/Met   Problem: RH Toilet Transfers Goal: LTG Patient will perform toilet transfers w/assist (OT) Description: LTG: Patient will perform toilet transfers with assist, with/without cues using equipment (OT) Outcome: Completed/Met   Problem: RH Tub/Shower Transfers Goal: LTG Patient will perform tub/shower transfers w/assist (OT) Description: LTG: Patient will perform tub/shower transfers with assist, with/without cues using equipment (OT) Outcome: Completed/Met   

## 2022-10-19 NOTE — Progress Notes (Signed)
Physical Therapy Discharge Summary  Patient Details  Name: James Estrada MRN: [id number] Date of Birth: 03-12-1941  Date of Discharge from PT service:October 19, 2022  Today's Date: 10/19/2022 PT Individual Time: 1417-1530 PT Individual Time Calculation (min): 73 min    Patient has met 7 of 8 long term goals due to improved activity tolerance, improved balance, improved postural control, increased strength, and functional use of  left upper extremity and left lower extremity.  Patient to discharge at an ambulatory level  supervision/ CGA .   Patient's care partner is independent to provide the necessary physical and cognitive assistance at discharge.  Reasons goals not met: One LTG deemed adequate for d/c. Pt's dynamic standing balance is usually CGA with minimal, intermittent need for MinA to maintain balance. Family has been trained for CGA and rare, intermittent instance of LOB.   Recommendation:  Patient will benefit from ongoing skilled PT services in home health setting to continue to advance safe functional mobility, address ongoing impairments in strength, coordination, balance, activity tolerance, cognition, safety awareness, and minimize fall risk.  Equipment: RW  Reasons for discharge: treatment goals met and discharge from hospital  Patient/family agrees with progress made and goals achieved: Yes  PT Discharge Precautions/Restrictions Precautions Precautions: Sternal;Fall Precaution Comments: L visual field deficit Restrictions Weight Bearing Restrictions: No Other Position/Activity Restrictions: sternal precautions - keep hands close to body with mobility Vital Signs Therapy Vitals Temp: 98.3 F (36.8 C) Pulse Rate: 69 Resp: 15 BP: 116/82 Oxygen Therapy SpO2: 98 % Pain Pain Assessment Pain Scale: 0-10 Pain Score: 0-No pain Pain Interference Pain Interference Pain Effect on Sleep: 0. Does not apply - I have not had any pain or hurting in the past 5  days Pain Interference with Therapy Activities: 0. Does not apply - I have not received rehabilitationtherapy in the past 5 days Pain Interference with Day-to-Day Activities: 1. Rarely or not at all Vision/Perception  Vision - History Ability to See in Adequate Light: 1 Impaired Vision - Assessment Eye Alignment: Within Functional Limits Ocular Range of Motion: Within Functional Limits Alignment/Gaze Preference: Within Defined Limits Tracking/Visual Pursuits: Decreased smoothness of eye movement to LEFT superior field;Decreased smoothness of eye movement to LEFT inferior field Saccades: Within functional limits Convergence: Within functional limits Additional Comments: improved ability to follow directions but continues to need max cues to do visual assessment Perception Perception: Impaired Inattention/Neglect: Does not attend to left visual field Praxis Praxis: Impaired Praxis Impairment Details: Ideomotor Praxis-Other Comments: intetmittent confusion with ideomotor  Cognition Overall Cognitive Status: Impaired/Different from baseline Arousal/Alertness: Awake/alert Orientation Level: Oriented to person;Oriented to place;Oriented to situation Memory: Impaired Memory Impairment: Retrieval deficit;Decreased short term memory Awareness: Impaired Problem Solving: Impaired Sequencing: Impaired Decision Making: Impaired Initiating: Impaired Behaviors: Impulsive Safety/Judgment: Impaired Sensation Sensation Light Touch: Impaired by gross assessment Coordination Gross Motor Movements are Fluid and Coordinated: No Fine Motor Movements are Fluid and Coordinated: No Heel Shin Test: requires extra time for processing; does not perform Motor  Motor Motor: Other (comment) Motor - Discharge Observations: slow to process and initiate with minimal apraxia (mostly tool apraxia), L visual inattention  Mobility Bed Mobility Bed Mobility: Supine to Sit;Sit to Supine;Rolling Right;Rolling  Left Rolling Right: Independent Rolling Left: Independent Supine to Sit: Independent Sit to Supine: Independent Transfers Transfers: Sit to Stand;Stand to Sit;Stand Pivot Transfers Sit to Stand: Supervision/Verbal cueing Stand to Sit: Supervision/Verbal cueing Stand Pivot Transfers: Supervision/Verbal cueing Stand Pivot Transfer Details: Verbal cues for safe use of DME/AE;Verbal cues for precautions/safety  Transfer (Assistive device): Manufacturing systems engineer Ambulation: Yes Gait Assistance: Supervision/Verbal cueing Gait Distance (Feet): 800 Feet Assistive device: Rolling walker Gait Assistance Details: Verbal cues for precautions/safety;Verbal cues for safe use of DME/AE Gait Gait: Yes Gait Pattern: Step-through pattern (slight L toe out) Gait velocity: reduced Stairs / Additional Locomotion Stairs: Yes Stairs Assistance: Contact Guard/Touching assist Stair Management Technique: Step to pattern;Other (comment);With cane (SBQC in R hand and HHA to LUE) Number of Stairs: 12 Height of Stairs: 6 Ramp: Contact Guard/touching assist Curb: Contact Guard/Touching assist Wheelchair Mobility Wheelchair Mobility: No  Trunk/Postural Assessment  Cervical Assessment Cervical Assessment: Exceptions to [facility] (slightly forward head) Thoracic Assessment Thoracic Assessment: Exceptions to [facility] (rounded shoulders) Lumbar Assessment Lumbar Assessment: Within Functional Limits Postural Control Postural Control: Within Functional Limits  Balance Balance Balance Assessed: Yes Static Sitting Balance Static Sitting - Level of Assistance: 7: Independent Dynamic Sitting Balance Dynamic Sitting - Level of Assistance: 6: Modified independent (Device/Increase time) Static Standing Balance Static Standing - Level of Assistance: 5: Stand by assistance Dynamic Standing Balance Dynamic Standing - Level of Assistance: 4: Min assist (CGA) Extremity Assessment      RLE Assessment RLE  Assessment: Within Functional Limits General Strength Comments: Functionally WFL and 4+/ 5 prox to distal LLE Assessment General Strength Comments: Functionally WFL and 4 to 4+/ 5 prox to distal  Skilled Intervention: Patient  seated upright with wife and dtr present on entrance to room. Patient alert and agreeable to PT session.   Patient with no pain complaint at start of session.  Therapeutic Activity: Transfers: Pt performed sit<>stand and stand pivot transfers throughout session with supervision and no need for UE support to attain balance. Provided verbal cues for maintaining RW position in front of pt. Educated family re: pt's one instance of moving to sit early d/t fatigue and visual target.  Gait Training/ NMR:  Pt ambulated > 200 ft  x2 demonstrating CGA to wife and dtr. Pt with one instance of cinfusion with self-selected turn to elevators. With correction, pt performs crossover stepping with minor wobble in balance and provided with very light MinA to maintain balance.  Demonstrated and educated family re: maintaining pt's balance. Provided vc/ tc to family for good technique.  Stair training performed with pt using SBQC in RUE and HHA for LUE. Performed once with instructions to dtr, once with dtr recording on phone and once with dtr providing assist. Dtr does well with assist and attention to pt's balance. Once instance of repositioning her provision of support to RUE. Good performance by both parties.   NMR facilitated during session with focus on problem solving and visual field scanning. Pt guided in stopping/ starting and direction selection with vc for direction throughout ambulation and stair training. NMR performed for improvements in motor control and coordination, balance, sequencing, judgement, and self confidence/ efficacy in performing all aspects of mobility at highest level of independence.   Family with questions re: visual field deficits, handicapped parking permit,  stroke support group, and various other questions surrounding d/c tomorrow morning. Provided with answers as best as can be provided and referred remaining questions to MD and CSW. CSW to provide form to use for handicapped hangtag.   Patient seated upright in recliner at end of session with brakes locked, belt alarm set, and all needs within reach. Dtr in room with pt.    Alger Simons PT, DPT, CSRS 10/19/2022, 5:44 PM

## 2022-10-20 ENCOUNTER — Other Ambulatory Visit (HOSPITAL_COMMUNITY): Payer: Self-pay

## 2022-10-20 ENCOUNTER — Telehealth (HOSPITAL_COMMUNITY): Payer: Self-pay

## 2022-10-20 DIAGNOSIS — I6601 Occlusion and stenosis of right middle cerebral artery: Secondary | ICD-10-CM

## 2022-10-20 MED ORDER — SENNOSIDES-DOCUSATE SODIUM 8.6-50 MG PO TABS
1.0000 | ORAL_TABLET | Freq: Every evening | ORAL | 0 refills | Status: DC | PRN
  Filled 2022-10-20: qty 30, 30d supply, fill #0

## 2022-10-20 MED ORDER — ATORVASTATIN CALCIUM 40 MG PO TABS
40.0000 mg | ORAL_TABLET | Freq: Every day | ORAL | 0 refills | Status: AC
  Filled 2022-10-20: qty 30, 30d supply, fill #0

## 2022-10-20 MED ORDER — METOPROLOL TARTRATE 25 MG PO TABS
12.5000 mg | ORAL_TABLET | Freq: Two times a day (BID) | ORAL | 0 refills | Status: AC
  Filled 2022-10-20: qty 15, 15d supply, fill #0

## 2022-10-20 MED ORDER — DOXAZOSIN MESYLATE 1 MG PO TABS
1.0000 mg | ORAL_TABLET | Freq: Every day | ORAL | 0 refills | Status: AC
  Filled 2022-10-20: qty 30, 30d supply, fill #0

## 2022-10-20 MED ORDER — CITALOPRAM HYDROBROMIDE 10 MG PO TABS
10.0000 mg | ORAL_TABLET | Freq: Every day | ORAL | 0 refills | Status: DC
  Filled 2022-10-20: qty 30, 30d supply, fill #0

## 2022-10-20 MED ORDER — AMIODARONE HCL 200 MG PO TABS
200.0000 mg | ORAL_TABLET | Freq: Every day | ORAL | 0 refills | Status: DC
  Filled 2022-10-20: qty 30, 30d supply, fill #0

## 2022-10-20 MED ORDER — ASPIRIN 81 MG PO CHEW
81.0000 mg | CHEWABLE_TABLET | Freq: Every day | ORAL | 0 refills | Status: AC
  Filled 2022-10-20: qty 30, 30d supply, fill #0

## 2022-10-20 MED ORDER — ACETAMINOPHEN 325 MG PO TABS: 325.0000 mg | ORAL_TABLET | ORAL | Status: DC | PRN

## 2022-10-20 NOTE — Progress Notes (Signed)
Inpatient Rehabilitation Care Coordinator Discharge Note   Patient Details  Name: James Estrada MRN: [id number] Date of Birth: 1941-07-10   Discharge location: Home  Length of Stay: 16 Days  Discharge activity level: Supervision  Home/community participation: Spouse and Daughter  Patient response TO:IZTIWP Literacy - How often do you need to have someone help you when you read instructions, pamphlets, or other written material from your doctor or pharmacy?: Never  Patient response YK:DXIPJA Isolation - How often do you feel lonely or isolated from those around you?: Never  Services provided included: MD, RD, PT, OT, SLP, RN, CM, TR, Pharmacy, Neuropsych, SW  Financial Services:  Field seismologist Utilized: Private Insurance [employer] MEDICARE  Choices offered to/list presented to: patient spouse and daughter  Follow-up services arranged:  Home Health [facility] Agency: [facility]         Patient response to transportation need: Is the patient able to respond to transportation needs?: Yes In the past 12 months, has lack of transportation kept you from medical appointments or from getting medications?: No In the past 12 months, has lack of transportation kept you from meetings, work, or from getting things needed for daily living?: No    Comments (or additional information):  Patient/Family verbalized understanding of follow-up arrangements:  Yes  Individual responsible for coordination of the follow-up plan: Lanora Manis (spouse)  Confirmed correct DME delivered: Andria Rhein 10/20/2022    Andria Rhein

## 2022-10-20 NOTE — Progress Notes (Signed)
Inpatient Rehabilitation Discharge Medication Review by a Pharmacist  A complete drug regimen review was completed for this patient to identify any potential clinically significant medication issues.  High Risk Drug Classes Is patient taking? Indication by Medication  Antipsychotic No   Anticoagulant No   Antibiotic No   Opioid No   Antiplatelet Yes ASA - CAD  Hypoglycemics/insulin No   Vasoactive Medication Yes Amiodarone, metoprolol - afib doxazosin - BPH, urinary hesitancy  Chemotherapy No   Other Yes Atorvastatin - HLD Citalopram - mood Senokot-S - constipation     Type of Medication Issue Identified Description of Issue Recommendation(s)  Drug Interaction(s) (clinically significant)     Duplicate Therapy     Allergy     No Medication Administration End Date     Incorrect Dose     Additional Drug Therapy Needed     Significant med changes from prior encounter (inform family/care partners about these prior to discharge). No medications PTA, all medications are new at discharge Communicate medication changes with patient/family at discharge  Other       Clinically significant medication issues were identified that warrant physician communication and completion of prescribed/recommended actions by midnight of the next day:  No  Pharmacist comments: n/a   Time spent performing this drug regimen review (minutes): 20   Thank you for allowing pharmacy to be a part of this patient's care.  Thelma Barge, PharmD Clinical Pharmacist

## 2022-10-20 NOTE — Telephone Encounter (Signed)
Pt is currently in CIR.   Will placed pt ppw in CIR folder.

## 2022-10-20 NOTE — Progress Notes (Signed)
PROGRESS NOTE   Subjective/Complaints:    ROS: limited due to language/communication   Objective:   No results found. No results for input(s): "WBC", "HGB", "HCT", "PLT" in the last 72 hours.   No results for input(s): "NA", "K", "CL", "CO2", "GLUCOSE", "BUN", "CREATININE", "CALCIUM" in the last 72 hours.    Intake/Output Summary (Last 24 hours) at 10/20/2022 0842 Last data filed at 10/20/2022 0424 Gross per 24 hour  Intake 118 ml  Output 1200 ml  Net -1082 ml         Physical Exam: Vital Signs Blood pressure 114/73, pulse 65, temperature 98.7 F (37.1 C), temperature source Oral, resp. rate 16, weight 75.4 kg, SpO2 99 %. Physical Exam    General: No acute distress Mood and affect are appropriate Heart: Regular rate and rhythm no rubs murmurs or extra sounds Lungs: Clear to auscultation, breathing unlabored, no rales or wheezes Abdomen: Positive bowel sounds, soft nontender to palpation, nondistended Extremities: No clubbing, cyanosis, or edema Skin: No evidence of breakdown, no evidence of rash    Skin:    General: Skin is warm and dry. Mild ecchymosis nose, better Neurological:     Mental Status:  Oriented to self only.  Language: Fluent but with significant receptive and expressive aphasia. perseveration - is able to communicate thoughts now with extra time and some cueing. Can follow simple commands  +apraxia but improving  4/5 LUE, 5/5 RUE 5/5 RLE 4+ LLE               Assessment/Plan: 1. Functional deficits right MCA infarct  Stable for D/C today F/u PCP in 3-4 weeks F/u Neuro 1-2 mo  F/u PM&R 2 weeks See D/C summary See D/C instructions   Care Tool:  Bathing    Body parts bathed by patient: Chest, Abdomen, Face, Left upper leg, Right upper leg, Left arm, Right arm, Front perineal area, Buttocks, Right lower leg, Left lower leg   Body parts bathed by helper: Right arm, Front  perineal area, Buttocks, Right lower leg, Left lower leg     Bathing assist Assist Level: Supervision/Verbal cueing     Upper Body Dressing/Undressing Upper body dressing   What is the patient wearing?: Pull over shirt    Upper body assist Assist Level: Set up assist    Lower Body Dressing/Undressing Lower body dressing      What is the patient wearing?: Incontinence brief, Pants     Lower body assist Assist for lower body dressing: Supervision/Verbal cueing     Toileting Toileting    Toileting assist Assist for toileting: Supervision/Verbal cueing     Transfers Chair/bed transfer  Transfers assist  Chair/bed transfer activity did not occur: Safety/medical concerns  Chair/bed transfer assist level: Supervision/Verbal cueing     Locomotion Ambulation   Ambulation assist   Ambulation activity did not occur: Safety/medical concerns (dizziness and mild diaphoresis on standing)  Assist level: Contact Guard/Touching assist Assistive device: Walker-rolling Max distance: 800 ft   Walk 10 feet activity   Assist  Walk 10 feet activity did not occur: Safety/medical concerns  Assist level: Supervision/Verbal cueing Assistive device: Walker-rolling   Walk 50 feet activity   Assist  Walk 50 feet with 2 turns activity did not occur: Safety/medical concerns  Assist level: Contact Guard/Touching assist Assistive device: Walker-rolling    Walk 150 feet activity   Assist Walk 150 feet activity did not occur: Safety/medical concerns  Assist level: Contact Guard/Touching assist Assistive device: Walker-rolling    Walk 10 feet on uneven surface  activity   Assist Walk 10 feet on uneven surfaces activity did not occur: Safety/medical concerns   Assist level: Contact Guard/Touching assist Assistive device: Walker-rolling   Wheelchair     Assist Is the patient using a wheelchair?: No Type of Wheelchair: Manual Wheelchair activity did not occur:  Refused  Wheelchair assist level: Dependent - Patient 0% Max wheelchair distance: 35 ft    Wheelchair 50 feet with 2 turns activity    Assist    Wheelchair 50 feet with 2 turns activity did not occur: Refused   Assist Level: Dependent - Patient 0%   Wheelchair 150 feet activity     Assist  Wheelchair 150 feet activity did not occur: Refused   Assist Level: Dependent - Patient 0%   Blood pressure 114/73, pulse 65, temperature 98.7 F (37.1 C), temperature source Oral, resp. rate 16, weight 75.4 kg, SpO2 99 %.    Medical Problem List and Plan: 1. Functional deficits secondary to R brain strokes x3- frontal, BG and cerebellum with L hemiparesis and aphasia s/p Aortic dissection             -patient may  shower             -ELOS/Goals: dc date 10-20-22  supervision- will have HHPT,OT, SLP, RN Nsg to change foley and instruct wife re: leg bag change prior to discharge   2.  Antithrombotics: -DVT/anticoagulation: If atrial fibrillation recurs, may need to consider AC             -antiplatelet therapy: aspirin 81 mg daily             SCDs and TEDs; consider starting Lovenox 3. Pain Management: Tylenol as needed 4. Mood/Behavior/Sleep:     -celexa for anxiety              -antipsychotic agents: n/a 5. Neuropsych/cognition: This patient is not capable of making decisions on his own behalf. 6. Skin/Wound Care: Routine skin care checks  7. Fluids/Electrolytes/Nutrition: Strict Is and Os and follow-up chemistries             -daily weight             -continue dysphagia 2 diet/thins; SLP eval             -continue feeding supplement  8: Heart failure with pEF (see meds below)          compensated , wts stable  Filed Weights   10/17/22 0443 10/18/22 0500 10/19/22 0500  Weight: 76 kg 75.6 kg 75.4 kg     9: Leukocytosis/leukemoid reaction: resolved  -completed Zosyn, starting Augmentin BID for 10 days thru 12/1                 Latest Ref Rng & Units 10/17/2022    7:59  AM 10/10/2022    5:13 AM 10/07/2022    4:48 AM  CBC  WBC 4.0 - 10.5 K/uL 9.0  10.4  15.1   Hemoglobin 13.0 - 17.0 g/dL 10.1  9.0  8.7   Hematocrit 39.0 - 52.0 % 31.8  29.3  27.6   Platelets 150 - 400 K/uL 140  247  378     10: Post-op atrial fibrillation:  -continue amiodarone 200 mg BID for 7 days then 200 mg daily             -if recurs, consider AC  - HR well controlled; monitor    10/20/2022    4:20 AM 10/19/2022    7:42 PM 10/19/2022    3:11 PM  Vitals with BMI  Systolic 114 151 716  Diastolic 73 94 82  Pulse 65 68 69     11: Left renal artery stenosis: follow-up as outpatient 12: Acute blood loss anemia: follow-up CBC  - 12/4 stable today at 10.1 HGB 13: Hyperlipidemia: continue Lipitor 40 mg daily 14: GERD/HH: no meds (Pepcid discontinued) 15: Hypertension: montior TID and prn             -continue Lopressor 12.5 mg BID             -restart losartan 50 mg daily, Spironolactone 12.5 mg as BP allows   Vitals:   10/19/22 1942 10/20/22 0420  BP: (!) 151/94 114/73  Pulse: 68 65  Resp: 17 16  Temp: 98.4 F (36.9 C) 98.7 F (37.1 C)  SpO2: 100% 99%  Well controlled 12/7 16: Acute urinary retention:             -has foley cath - Dced on admission, sarting PVRs Q6H for voiding trial             -continue Flomax 0.4 mg daily -            -failed voiding trial x 1 , home with foley , change prior to discharge Pt has appt with [facility], Dr Marlou Porch 11/29/22 , so HHRN will need to change cath in about 3wks   17: Acute kidney injury: serum creatinine improving; follow-up BMP    Latest Ref Rng & Units 10/17/2022    7:59 AM 10/10/2022    5:13 AM 10/07/2022    4:48 AM  BMP  Glucose 70 - 99 mg/dL 967  893  810   BUN 8 - 23 mg/dL 15  17  18    Creatinine 0.61 - 1.24 mg/dL  1.75  1.02   Sodium 135 - 145 mmol/L 138  137  141   Potassium 3.5 - 5.1 mmol/L 3.9  4.0  3.8   Chloride 98 - 111 mmol/L 106  106  103   CO2 22 - 32 mmol/L 24  23  24    Calcium 8.9 - 10.3  mg/dL 8.4  8.2  8.7   5.85 Cr/Bun stable  19. Constipation             - Added Senokot-S 1 tab QHS  12/5 LBM   LOS: 16 days A FACE TO FACE EVALUATION WAS PERFORMED  27/7 10/20/2022, 8:42 AM

## 2022-10-22 DIAGNOSIS — Z466 Encounter for fitting and adjustment of urinary device: Secondary | ICD-10-CM | POA: Diagnosis not present

## 2022-10-22 DIAGNOSIS — Z9181 History of falling: Secondary | ICD-10-CM | POA: Diagnosis not present

## 2022-10-22 DIAGNOSIS — R32 Unspecified urinary incontinence: Secondary | ICD-10-CM | POA: Diagnosis not present

## 2022-10-22 DIAGNOSIS — I5032 Chronic diastolic (congestive) heart failure: Secondary | ICD-10-CM | POA: Diagnosis not present

## 2022-10-22 DIAGNOSIS — E785 Hyperlipidemia, unspecified: Secondary | ICD-10-CM | POA: Diagnosis not present

## 2022-10-22 DIAGNOSIS — R1310 Dysphagia, unspecified: Secondary | ICD-10-CM | POA: Diagnosis not present

## 2022-10-22 DIAGNOSIS — I714 Abdominal aortic aneurysm, without rupture, unspecified: Secondary | ICD-10-CM | POA: Diagnosis not present

## 2022-10-22 DIAGNOSIS — K449 Diaphragmatic hernia without obstruction or gangrene: Secondary | ICD-10-CM | POA: Diagnosis not present

## 2022-10-22 DIAGNOSIS — I110 Hypertensive heart disease with heart failure: Secondary | ICD-10-CM | POA: Diagnosis not present

## 2022-10-22 DIAGNOSIS — I701 Atherosclerosis of renal artery: Secondary | ICD-10-CM | POA: Diagnosis not present

## 2022-10-22 DIAGNOSIS — M1990 Unspecified osteoarthritis, unspecified site: Secondary | ICD-10-CM | POA: Diagnosis not present

## 2022-10-22 DIAGNOSIS — I480 Paroxysmal atrial fibrillation: Secondary | ICD-10-CM | POA: Diagnosis not present

## 2022-10-22 DIAGNOSIS — Z7982 Long term (current) use of aspirin: Secondary | ICD-10-CM | POA: Diagnosis not present

## 2022-10-22 DIAGNOSIS — I69152 Hemiplegia and hemiparesis following nontraumatic intracerebral hemorrhage affecting left dominant side: Secondary | ICD-10-CM | POA: Diagnosis not present

## 2022-10-22 DIAGNOSIS — D62 Acute posthemorrhagic anemia: Secondary | ICD-10-CM | POA: Diagnosis not present

## 2022-10-22 DIAGNOSIS — N179 Acute kidney failure, unspecified: Secondary | ICD-10-CM | POA: Diagnosis not present

## 2022-10-22 DIAGNOSIS — K219 Gastro-esophageal reflux disease without esophagitis: Secondary | ICD-10-CM | POA: Diagnosis not present

## 2022-10-24 ENCOUNTER — Other Ambulatory Visit: Payer: Self-pay | Admitting: Thoracic Surgery (Cardiothoracic Vascular Surgery)

## 2022-10-24 DIAGNOSIS — I71019 Dissection of thoracic aorta, unspecified: Secondary | ICD-10-CM

## 2022-10-25 ENCOUNTER — Ambulatory Visit
Admission: RE | Admit: 2022-10-25 | Discharge: 2022-10-25 | Disposition: A | Discharge status: Home / Self Care | Payer: Medicare Other | Source: Ambulatory Visit | Attending: Thoracic Surgery (Cardiothoracic Vascular Surgery) | Admitting: Thoracic Surgery (Cardiothoracic Vascular Surgery)

## 2022-10-25 ENCOUNTER — Ambulatory Visit (INDEPENDENT_AMBULATORY_CARE_PROVIDER_SITE_OTHER): Payer: Self-pay | Admitting: Thoracic Surgery (Cardiothoracic Vascular Surgery)

## 2022-10-25 ENCOUNTER — Ambulatory Visit: Payer: Medicare Other

## 2022-10-25 ENCOUNTER — Encounter: Payer: Self-pay | Admitting: Thoracic Surgery (Cardiothoracic Vascular Surgery)

## 2022-10-25 VITALS — BP 147/76 | HR 73 | Resp 20 | Wt 170.0 lb

## 2022-10-25 DIAGNOSIS — Z9889 Other specified postprocedural states: Secondary | ICD-10-CM

## 2022-10-25 DIAGNOSIS — I71019 Dissection of thoracic aorta, unspecified: Secondary | ICD-10-CM

## 2022-10-25 DIAGNOSIS — I7101 Dissection of ascending aorta: Secondary | ICD-10-CM

## 2022-10-25 DIAGNOSIS — I7100 Dissection of unspecified site of aorta: Secondary | ICD-10-CM | POA: Diagnosis not present

## 2022-10-26 ENCOUNTER — Encounter: Payer: Self-pay | Admitting: Thoracic Surgery (Cardiothoracic Vascular Surgery)

## 2022-10-26 DIAGNOSIS — I6340 Cerebral infarction due to embolism of unspecified cerebral artery: Secondary | ICD-10-CM | POA: Diagnosis not present

## 2022-10-26 DIAGNOSIS — R339 Retention of urine, unspecified: Secondary | ICD-10-CM | POA: Diagnosis not present

## 2022-10-26 DIAGNOSIS — G4701 Insomnia due to medical condition: Secondary | ICD-10-CM | POA: Diagnosis not present

## 2022-10-26 DIAGNOSIS — N179 Acute kidney failure, unspecified: Secondary | ICD-10-CM | POA: Diagnosis not present

## 2022-10-26 DIAGNOSIS — I7101 Dissection of ascending aorta: Secondary | ICD-10-CM

## 2022-10-26 HISTORY — DX: Dissection of ascending aorta: I71.010

## 2022-10-26 NOTE — Progress Notes (Signed)
[address]       [place] [address]             [phone]       HPI: Mr. Dave presents for a scheduled postoperative follow up visit after repair of a type 1 aortic dissection.  Arhan Mcmanamon is a 81 year-old man with a history of hypertension, hyperlipidemia, bradycardia, frequent PACs and an abdominal aortic aneurysm.  On 09/17/2022 he presented with chest pain, syncope and severe bilateral leg weakness.  Ct showed a type 1 aortic dissection.  He underwent emergent repair under moderate circulatory arrest using retrograde cerebral perfusion.  Postoperatively noted to have stroke with left hemiparesis and aphasia.  He atrial fibrillation.  He was extubated in POD # 5. Also treated for presumed pneumonia.  He gradually recovered and went to CIR on 10/04/2022.  He continued to progress and was discharged home on 10/20/2022.  Since discharge he has continued to work with [facility] and therapists.  He denies pain and shortness of breath.    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     amiodarone (PACERONE) 200 MG tablet Take 1 tablet (200 mg total) by mouth daily. 30 tablet 0   aspirin 81 MG chewable tablet Chew 1 tablet (81 mg total) by mouth daily. 30 tablet 0    atorvastatin (LIPITOR) 40 MG tablet Take 1 tablet (40 mg total) by mouth daily. 30 tablet 0   citalopram (CELEXA) 10 MG tablet Take 1 tablet (10 mg total) by mouth daily. 30 tablet 0   doxazosin (CARDURA) 1 MG tablet Take 1 tablet (1 mg total) by mouth at bedtime. 30 tablet 0   feeding supplement (ENSURE ENLIVE / ENSURE PLUS) LIQD Take 237 mLs by mouth 2 (two) times daily between meals. 237 mL 12   metoprolol tartrate (LOPRESSOR) 25 MG tablet Take 0.5 tablets (12.5 mg total) by mouth 2 (two) times daily. 15 tablet 0   senna-docusate (SENOKOT-S) 8.6-50 MG tablet Take 1 tablet by mouth at bedtime as needed for mild constipation. 30 tablet 0   No current facility-administered medications for this visit.    Physical Exam BP (!) 147/76   Pulse 73   Resp 20   Wt 170 lb (77.1 kg)   SpO2 96% Comment: RA  BMI 23.06 kg/m  81 yo man in no acute distress Alert, oriented, hesitant speech with some receptive and expressive aphasia Well developed and well nourished Cardiac RRR, no murmur Lungs clear bilaterally No edema Sternum stable, incision well healed  Diagnostic Tests: CHEST - 2 VIEW   COMPARISON:  09/30/2022   FINDINGS: Cardiomediastinal contours are within normal limits. Prior sternotomy. No  focal airspace consolidation, pleural effusion, or pneumothorax.   IMPRESSION: No active cardiopulmonary disease.     Electronically Signed   By: Davina Poke D.O.   On: 10/25/2022 13:00  Impression: Milik Fabregas is a 81 year-old man with a history of hypertension, hyperlipidemia, bradycardia, frequent PACs, abdominal aortic aneurysm, type 1 aortic dissection, hemiarch repair, CVA, atrial fibrillation and pneumonia.  Type 1 aortic dissection- Ascending aorta replaced. Has residual dissection extending into iliacs. Will need surveillance.  Will plan a CT angio in 6 weeks - 3 months from repair.  Hypertension- BP mildly elevated.  He is being checked at home regularly and BP has been  in normal range.  Continue to monitor.  CVA- has had significant improvement with time.  Hopefully will continue to progress  Atrial fibrillation- postop-  in regular rhythm on exam.  On amiodarone- will defer to Cardiology re: duration   Plan: Return in 6 weeks with CT angio chest, abdomen and pelvis   [name], MD [facility] [phone]

## 2022-10-28 ENCOUNTER — Telehealth: Payer: Self-pay

## 2022-10-28 NOTE — Telephone Encounter (Signed)
Verbal Order Given Per Discharge Summary

## 2022-10-30 ENCOUNTER — Encounter (HOSPITAL_BASED_OUTPATIENT_CLINIC_OR_DEPARTMENT_OTHER): Payer: Self-pay

## 2022-10-30 ENCOUNTER — Other Ambulatory Visit: Payer: Self-pay

## 2022-10-30 ENCOUNTER — Emergency Department (HOSPITAL_BASED_OUTPATIENT_CLINIC_OR_DEPARTMENT_OTHER)
Admission: EM | Admit: 2022-10-30 | Discharge: 2022-10-30 | Disposition: A | Discharge status: Home / Self Care | Payer: Medicare Other | Attending: Emergency Medicine | Admitting: Emergency Medicine

## 2022-10-30 DIAGNOSIS — N3000 Acute cystitis without hematuria: Secondary | ICD-10-CM | POA: Diagnosis not present

## 2022-10-30 DIAGNOSIS — Z79899 Other long term (current) drug therapy: Secondary | ICD-10-CM | POA: Diagnosis not present

## 2022-10-30 DIAGNOSIS — D72829 Elevated white blood cell count, unspecified: Secondary | ICD-10-CM | POA: Insufficient documentation

## 2022-10-30 DIAGNOSIS — I10 Essential (primary) hypertension: Secondary | ICD-10-CM | POA: Diagnosis not present

## 2022-10-30 DIAGNOSIS — Z7982 Long term (current) use of aspirin: Secondary | ICD-10-CM | POA: Insufficient documentation

## 2022-10-30 DIAGNOSIS — Z96641 Presence of right artificial hip joint: Secondary | ICD-10-CM | POA: Diagnosis not present

## 2022-10-30 DIAGNOSIS — N139 Obstructive and reflux uropathy, unspecified: Secondary | ICD-10-CM | POA: Diagnosis present

## 2022-10-30 LAB — CBC WITH DIFFERENTIAL/PLATELET
Abs Immature Granulocytes: 0.26 10*3/uL — ABNORMAL HIGH (ref 0.00–0.07)
Basophils Absolute: 0.1 10*3/uL (ref 0.0–0.1)
Basophils Relative: 1 %
Eosinophils Absolute: 0.2 10*3/uL (ref 0.0–0.5)
Eosinophils Relative: 2 %
HCT: 34.6 % — ABNORMAL LOW (ref 39.0–52.0)
Hemoglobin: 10.8 g/dL — ABNORMAL LOW (ref 13.0–17.0)
Immature Granulocytes: 2 %
Lymphocytes Relative: 13 %
Lymphs Abs: 1.6 10*3/uL (ref 0.7–4.0)
MCH: 27.3 pg (ref 26.0–34.0)
MCHC: 31.2 g/dL (ref 30.0–36.0)
MCV: 87.4 fL (ref 80.0–100.0)
Monocytes Absolute: 1.1 10*3/uL — ABNORMAL HIGH (ref 0.1–1.0)
Monocytes Relative: 9 %
Neutro Abs: 9.6 10*3/uL — ABNORMAL HIGH (ref 1.7–7.7)
Neutrophils Relative %: 73 %
Platelets: 174 10*3/uL (ref 150–400)
RBC: 3.96 MIL/uL — ABNORMAL LOW (ref 4.22–5.81)
RDW: 15 % (ref 11.5–15.5)
WBC: 12.9 10*3/uL — ABNORMAL HIGH (ref 4.0–10.5)
nRBC: 0 % (ref 0.0–0.2)

## 2022-10-30 LAB — URINALYSIS, ROUTINE W REFLEX MICROSCOPIC
Bilirubin Urine: NEGATIVE
Bilirubin Urine: NEGATIVE
Glucose, UA: NEGATIVE mg/dL
Glucose, UA: NEGATIVE mg/dL
Hgb urine dipstick: NEGATIVE
Hgb urine dipstick: NEGATIVE
Ketones, ur: NEGATIVE mg/dL
Ketones, ur: NEGATIVE mg/dL
Nitrite: POSITIVE — AB
Nitrite: POSITIVE — AB
Protein, ur: NEGATIVE mg/dL
Protein, ur: NEGATIVE mg/dL
Specific Gravity, Urine: <1.005 — ABNORMAL LOW (ref 1.005–1.030)
Specific Gravity, Urine: 1.005 (ref 1.005–1.030)
pH: 7 (ref 5.0–8.0)
pH: 7.5 (ref 5.0–8.0)

## 2022-10-30 LAB — BASIC METABOLIC PANEL
Anion gap: 8 (ref 5–15)
BUN: 18 mg/dL (ref 8–23)
CO2: 29 mmol/L (ref 22–32)
Calcium: 8.8 mg/dL — ABNORMAL LOW (ref 8.9–10.3)
Chloride: 101 mmol/L (ref 98–111)
Creatinine, Ser: 1.06 mg/dL (ref 0.61–1.24)
GFR, Estimated: >60 mL/min (ref >60)
Glucose, Bld: 109 mg/dL — ABNORMAL HIGH (ref 70–99)
Potassium: 4.2 mmol/L (ref 3.5–5.1)
Sodium: 138 mmol/L (ref 135–145)

## 2022-10-30 MED ORDER — CEFPODOXIME PROXETIL 200 MG PO TABS: 200.0000 mg | ORAL_TABLET | Freq: Two times a day (BID) | ORAL | 0 refills | Status: AC

## 2022-10-30 NOTE — ED Triage Notes (Signed)
Pt states his catheter has not drained like it is supposed to. Pt has been drinking all day and has very little urine output.

## 2022-10-30 NOTE — ED Notes (Signed)
Bladder scan performed by this tech and Tammy Sours RN.  0 - 3 mL noted in bladder.  Scan performed four (4) times.  Patient NAD at this time.

## 2022-10-30 NOTE — Discharge Instructions (Addendum)
Thank you for coming to [facility] Emergency Department. You were seen for concern that catheter was not draining. We did an exam, labs, and imaging, and these showed a urinary tract infection. We will treat with cefpodoxime twice per day x 7 days. Please follow up with your primary care provider within 1 week. Please follow with your urologist as originally scheduled.  Do not hesitate to return to the ED or call 911 if you experience: -Worsening symptoms -Catheter not draining urine -Lightheadedness, passing out -Fevers/chills -Anything else that concerns you

## 2022-10-30 NOTE — ED Provider Notes (Signed)
[facility]   CSN: [id number] Arrival date & time: 10/30/22  1423     History  Chief Complaint  Patient presents with   Catheter issues     James Estrada is a 81 y.o. male with BPH w/ urinary obstruction w/ foley in place, IBS, HLD, AAA and type 1 aortic dissection s/p repair, h/o CVA, HTN, hemorrhoids, s/p R hip replacement presents with catheter issues.   Per chart review, patient had a type I aortic dissection s/p hemashield graft on 11/5, post-op course in ICU complicated by CVAs, heart failure, Afib, agitation, AKI, dysphagia, PNA. Patient was admitted to rehab from 10/04/22 to 10/20/22 for I/p PT/ST/OT. Cr normalized and catheter was DC'd, with plan for PVR q6 hours. Completed augmentin through 12/1. Despite increase in flomax, PVRs remained high and so catheter was replaced on 11/24. Flomax changed to Cardura 1 mg qhs, foley changed on 12/7 on discharge from rehab, and HH arranged for RN to replace in 3 weeks with f/u with urology in January.   Today, patient presents with his wife and his son. He states his catheter had not been draining like it is supposed to since this AM. Pt has been drinking fluids all day and reports very little urine output.  However, while waiting in the waiting room, the catheter began to drain spontaneously again. No blood or clots noted. Patient notes discomfort from the catheter itself but otherwise no significant pain. No abdominal or flank pain, no f/c. Otherwise is recovering well.  Is not taking antibiotics currently.    HPI     Home Medications Prior to Admission medications   Medication Sig Start Date End Date Taking? Authorizing Provider  acetaminophen (TYLENOL) 325 MG tablet Take 1-2 tablets (325-650 mg total) by mouth every 4 (four) hours as needed for mild pain. 10/20/22   Setzer, Lynnell JudeSandra J, PA-C  amiodarone (PACERONE) 200 MG tablet Take 1 tablet (200 mg total) by mouth daily. 10/20/22   Setzer,  Lynnell JudeSandra J, PA-C  aspirin 81 MG chewable tablet Chew 1 tablet (81 mg total) by mouth daily. 10/20/22   Setzer, Lynnell JudeSandra J, PA-C  atorvastatin (LIPITOR) 40 MG tablet Take 1 tablet (40 mg total) by mouth daily. 10/20/22   Setzer, Lynnell JudeSandra J, PA-C  citalopram (CELEXA) 10 MG tablet Take 1 tablet (10 mg total) by mouth daily. 10/20/22   Setzer, Lynnell JudeSandra J, PA-C  doxazosin (CARDURA) 1 MG tablet Take 1 tablet (1 mg total) by mouth at bedtime. 10/20/22   Setzer, Lynnell JudeSandra J, PA-C  feeding supplement (ENSURE ENLIVE / ENSURE PLUS) LIQD Take 237 mLs by mouth 2 (two) times daily between meals. 10/04/22   Barrett, Erin R, PA-C  metoprolol tartrate (LOPRESSOR) 25 MG tablet Take 0.5 tablets (12.5 mg total) by mouth 2 (two) times daily. 10/20/22   Setzer, Lynnell JudeSandra J, PA-C  senna-docusate (SENOKOT-S) 8.6-50 MG tablet Take 1 tablet by mouth at bedtime as needed for mild constipation. 10/20/22   Setzer, Lynnell JudeSandra J, PA-C  tamsulosin (FLOMAX) 0.4 MG CAPS capsule Take 0.4 mg by mouth at bedtime. 11/11/22   [provider]      Allergies    Patient has no known allergies.    Review of Systems   Review of Systems Review of systems Negative for f/c.  A 10 point review of systems was performed and is negative unless otherwise reported in HPI.  Physical Exam Updated Vital Signs BP 133/88 (BP Location: Right Arm)   Pulse 79  Temp 98.8 F (37.1 C) (Oral)   Resp 17   Ht 6' (1.829 m)   Wt 77.1 kg   SpO2 95%   BMI 23.05 kg/m  Physical Exam General: Normal appearing male, lying in bed.  HEENT: Sclera anicteric, MMM, trachea midline.  Cardiology: RRR, no murmurs/rubs/gallops. BL radial and DP pulses equal bilaterally. Well healing surgical incision on chest without any erythema/induration/drainage.  Resp: Normal respiratory rate and effort. CTAB, no wheezes, rhonchi, crackles.  Abd: Soft, non-tender, non-distended. No rebound tenderness or guarding.  GU: Normal appearing circumcised genitalia with indwelling foley catheter.  No swelling, erythema, or induration/fluctuance of penis or scrotum/testicles. No blood at urethral meatus. Foley bag at bedside half full of transparent yellow urine.  MSK: No peripheral edema or signs of trauma. Extremities without deformity or TTP. No cyanosis or clubbing. Skin: warm, dry. No rashes or lesions. Back: No CVA tenderness Neuro: A&Ox4, CNs II-XII grossly intact. MAEs. Sensation grossly intact.  Psych: Normal mood and affect.   ED Results / Procedures / Treatments   Labs (all labs ordered are listed, but only abnormal results are displayed) Labs Reviewed  CBC WITH DIFFERENTIAL/PLATELET - Abnormal; Notable for the following components:      Result Value   WBC 12.9 (*)    RBC 3.96 (*)    Hemoglobin 10.8 (*)    HCT 34.6 (*)    Neutro Abs 9.6 (*)    Monocytes Absolute 1.1 (*)    Abs Immature Granulocytes 0.26 (*)    All other components within normal limits  BASIC METABOLIC PANEL - Abnormal; Notable for the following components:   Glucose, Bld 109 (*)    Calcium 8.8 (*)    All other components within normal limits  URINALYSIS, ROUTINE W REFLEX MICROSCOPIC - Abnormal; Notable for the following components:   Color, Urine COLORLESS (*)    Specific Gravity, Urine <1.005 (*)    Nitrite POSITIVE (*)    Leukocytes,Ua MODERATE (*)    Bacteria, UA MANY (*)    All other components within normal limits  URINALYSIS, ROUTINE W REFLEX MICROSCOPIC - Abnormal; Notable for the following components:   Color, Urine COLORLESS (*)    Nitrite POSITIVE (*)    Leukocytes,Ua MODERATE (*)    Bacteria, UA RARE (*)    All other components within normal limits    EKG None  Radiology No results found.  Procedures Procedures    EMERGENCY DEPARTMENT ULTRASOUND  Study: Limited Ultrasound of Bladder  INDICATIONS: to assess for urinary retention and/or bladder volume prior to urinary catheter Multiple views of the bladder were obtained in real-time in the transverse and longitudinal  planes with a multi-frequency probe.  PERFORMED BY: Myself IMAGES ARCHIVED?: No LIMITATIONS:  None INTERPRETATION:  Decompressed bladder with inflated foley balloon located appropriately inside   EMERGENCY DEPARTMENT US RENAL EXAM  "Study: Limited Retroperitoneal Ultrasound of Kidneys"  INDICATIONS:  report of foley obstruction Long and short axis of both kidneys were obtained.   PERFORMED BY: Myself IMAGES ARCHIVED?: No LIMITATIONS: None VIEWS USED: Long axis and Bladder  INTERPRETATION: No Hydronephrosis, No Renal cyst, No Kidney stone      Medications Ordered in ED Medications - No data to display  ED Course/ Medical Decision Making/ A&P                          Medical Decision Making Amount and/or Complexity of Data Reviewed Labs: ordered. Decision-making details documented in ED Course.  Risk Prescription drug management.   MDM:    Patient presents with reported obstruction of foley catheter though patient's foley is draining appropriately now. He has no blood observed in bag, no clots, no blood at urethral meatus to suggest hematuria as a cause of obstruction. Consider transient dislodgement of foley, though observed on BSUS to have inflated balloon appropriately located within decompressed bladder. Patient HDS and no fever, though consider CAUTI and will obtain clean sample. Foley draining now so do not believe we need to replace the foley here. Will perform BS renal ultrasound to ensure no hydronephrosis. Will obtain labs to evaluate for renal injury/electrolyte abnormalities or leukocytosis. Patient is overall very well-appearing.   Clinical Course as of 11/23/22 1412  Sun Oct 30, 2022  2046 WBC(!): 12.9 [HN]  2047 Hemoglobin(!): 10.8 [HN]  2047 Creatinine: 1.06 [HN]  2104 Urinalysis, Routine w reflex microscopic Urine, Catheterized(!) D/w RN who had sampled this urine from the foley bag. D/w nursing who will draw from the port and foley. Will perform BS  renal US now.  [HN]  2147 BS renal US does not demonstrate any urine in the bladder, foley balloon in appropriate location, and BL kidneys without evidence of hydronephrosis, both are appropriate size.  [HN]  2204 Repeat UTI is in process. Family would like to leave. [HN]    Clinical Course User Index [HN] Loetta Rough, MD     Labs: I Ordered, and personally interpreted labs.  The pertinent results include:  those listed above  Additional history obtained from wife, son, and chart review.   Reevaluation: I reevaluated the patient and found that they have :resolved  Social Determinants of Health: Patient lives with wife with [facility]  Disposition:  Renal function at baseline, serum labs significant for leukocytosis. Patient requests that we take his foley out here to see if he can urinate. Patient is waiting and anxious to leave the ED. I discussed with patient and his wife that we would need to wait for him to urinate here or that he would need to come back if he cannot urinate at home. Patient does not want to have to come back to ED so we elected to leave foley in place rather to f/u with urology as originally planned on his discharge from rehab facility. Repeat clean catch UA demonstrates likely CAUTI. BSUS demonstrates decompressed bladder with no BL hydronephrosis, reassuring against obstruction and foley has been draining appropriately here. Will treat patient with cefpodoxime and DC w/ urology follow-up in a couple of weeks as originally scheduled.  Patient will be DC'd into care of wife and son as well as home health. Given DC instructions/return precautions, all questions answered to patient and family's satisfaction.    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    Dispense:  20 tablet    Refill:  0    I have reviewed the patients home medicines and have made adjustments as needed  Problem List / ED Course: Problem List Items Addressed This Visit   None Visit Diagnoses     Acute cystitis without hematuria    -  Primary               This note was created using dictation software, which may contain spelling or grammatical errors.    Loetta Rough, MD 11/23/22 1420

## 2022-11-01 ENCOUNTER — Encounter: Payer: Medicare Other | Attending: Registered Nurse | Admitting: Registered Nurse

## 2022-11-02 ENCOUNTER — Telehealth: Payer: Self-pay

## 2022-11-02 NOTE — Telephone Encounter (Signed)
   Pre-operative Risk Assessment    Patient Name: James Estrada  DOB: 11/20/1940 MRN: [id number]     Request for Surgical Clearance    Procedure:  Dental Extraction - Amount of Teeth to be Pulled:  2  Date of Surgery:  Clearance TBD                                 Surgeon:  Will Grigg,DMD Surgeon's Group or Practice Name:  [facility] Phone number:  [phone] Fax number:  [phone]   Type of Clearance Requested:   - Medical    Type of Anesthesia:  Local    Additional requests/questions:    Signed, Ardith Dark   11/02/2022, 4:36 PM

## 2022-11-03 NOTE — Telephone Encounter (Signed)
    Primary Cardiologist: Little Ishikawa, MD  Chart reviewed as part of pre-operative protocol coverage. Simple dental extractions are considered low risk procedures per guidelines and generally do not require any specific cardiac clearance. It is also generally accepted that for simple extractions and dental cleanings, there is no need to interrupt blood thinner therapy.   SBE prophylaxis is not required for the patient.  I will route this recommendation to the requesting party via Epic fax function and remove from pre-op pool.  Please call with questions.  Ronney Asters, NP 11/03/2022, 11:45 AM

## 2022-11-04 ENCOUNTER — Other Ambulatory Visit (HOSPITAL_COMMUNITY): Payer: Self-pay

## 2022-11-08 DIAGNOSIS — I480 Paroxysmal atrial fibrillation: Secondary | ICD-10-CM | POA: Diagnosis not present

## 2022-11-08 DIAGNOSIS — R339 Retention of urine, unspecified: Secondary | ICD-10-CM | POA: Diagnosis not present

## 2022-11-08 DIAGNOSIS — I69354 Hemiplegia and hemiparesis following cerebral infarction affecting left non-dominant side: Secondary | ICD-10-CM | POA: Diagnosis not present

## 2022-11-08 DIAGNOSIS — I7100 Dissection of unspecified site of aorta: Secondary | ICD-10-CM | POA: Diagnosis not present

## 2022-11-11 DIAGNOSIS — R339 Retention of urine, unspecified: Secondary | ICD-10-CM | POA: Diagnosis not present

## 2022-11-18 DIAGNOSIS — R339 Retention of urine, unspecified: Secondary | ICD-10-CM | POA: Diagnosis not present

## 2022-11-21 DIAGNOSIS — Z9181 History of falling: Secondary | ICD-10-CM | POA: Diagnosis not present

## 2022-11-21 DIAGNOSIS — K219 Gastro-esophageal reflux disease without esophagitis: Secondary | ICD-10-CM | POA: Diagnosis not present

## 2022-11-21 DIAGNOSIS — N179 Acute kidney failure, unspecified: Secondary | ICD-10-CM | POA: Diagnosis not present

## 2022-11-21 DIAGNOSIS — I701 Atherosclerosis of renal artery: Secondary | ICD-10-CM | POA: Diagnosis not present

## 2022-11-21 DIAGNOSIS — Z466 Encounter for fitting and adjustment of urinary device: Secondary | ICD-10-CM | POA: Diagnosis not present

## 2022-11-21 DIAGNOSIS — E785 Hyperlipidemia, unspecified: Secondary | ICD-10-CM | POA: Diagnosis not present

## 2022-11-21 DIAGNOSIS — I480 Paroxysmal atrial fibrillation: Secondary | ICD-10-CM | POA: Diagnosis not present

## 2022-11-21 DIAGNOSIS — R32 Unspecified urinary incontinence: Secondary | ICD-10-CM | POA: Diagnosis not present

## 2022-11-21 DIAGNOSIS — R1310 Dysphagia, unspecified: Secondary | ICD-10-CM | POA: Diagnosis not present

## 2022-11-21 DIAGNOSIS — I110 Hypertensive heart disease with heart failure: Secondary | ICD-10-CM | POA: Diagnosis not present

## 2022-11-21 DIAGNOSIS — I5032 Chronic diastolic (congestive) heart failure: Secondary | ICD-10-CM | POA: Diagnosis not present

## 2022-11-21 DIAGNOSIS — D62 Acute posthemorrhagic anemia: Secondary | ICD-10-CM | POA: Diagnosis not present

## 2022-11-21 DIAGNOSIS — I714 Abdominal aortic aneurysm, without rupture, unspecified: Secondary | ICD-10-CM | POA: Diagnosis not present

## 2022-11-21 DIAGNOSIS — M1990 Unspecified osteoarthritis, unspecified site: Secondary | ICD-10-CM | POA: Diagnosis not present

## 2022-11-21 DIAGNOSIS — K449 Diaphragmatic hernia without obstruction or gangrene: Secondary | ICD-10-CM | POA: Diagnosis not present

## 2022-11-21 DIAGNOSIS — I69152 Hemiplegia and hemiparesis following nontraumatic intracerebral hemorrhage affecting left dominant side: Secondary | ICD-10-CM | POA: Diagnosis not present

## 2022-11-21 DIAGNOSIS — Z7982 Long term (current) use of aspirin: Secondary | ICD-10-CM | POA: Diagnosis not present

## 2022-11-22 ENCOUNTER — Encounter: Payer: Self-pay | Admitting: Neurology

## 2022-11-22 ENCOUNTER — Ambulatory Visit: Payer: Medicare Other | Admitting: Neurology

## 2022-11-22 VITALS — BP 120/78 | HR 101 | Ht 72.0 in | Wt 174.5 lb

## 2022-11-22 DIAGNOSIS — R413 Other amnesia: Secondary | ICD-10-CM | POA: Diagnosis not present

## 2022-11-22 DIAGNOSIS — I6349 Cerebral infarction due to embolism of other cerebral artery: Secondary | ICD-10-CM

## 2022-11-22 DIAGNOSIS — G3184 Mild cognitive impairment, so stated: Secondary | ICD-10-CM

## 2022-11-22 DIAGNOSIS — I69354 Hemiplegia and hemiparesis following cerebral infarction affecting left non-dominant side: Secondary | ICD-10-CM | POA: Diagnosis not present

## 2022-11-22 NOTE — Progress Notes (Signed)
[facility] [address] [place]. [place] [address] [phone]       OFFICE FOLLOW-UP NOTE  [name] Date of Birth:  1941-02-21 Medical Record Number:  [id number]   HPI: Mr.James Estrada is a pleasant 82 year old Caucasian male seen today for initial office follow-up visit following hospital consultation for stroke in November 2023.  He is accompanied by his wife and daughter and history is obtained from them and review of electronic medical records.  I have also personally reviewed pertinent available imaging films in PACS. James Estrada is a 82 y.o. male with a past medical history AAA s/p EVAR with a known internal iliac artery aneurysm, arthritis, bradycardia, dysrhythmia, GERD, hemorrhoids, HLD, and HTN presenting from home via EMS for acute onset of severe BLE weakness. He was ambulating and had sudden onset of the BLE weakness resulting in a fall. Triage RN exam did an NIHSS and only deficits were leg weakness, left worse than right. Around 1550 he was outside talking to neighbors when he felt acutely weak in his BLE and lightheaded, in conjunction with acute onset of mild left neck pain and some left flank pain. He fell due to his legs collapsing under him and had to drag himself on the ground to move when he tried to get to the phone. He did make it up the steps and was able to get to the phone in his bedroom to call EMS. He was brought in to the ED where a Code Stroke was called. Since his arrival to the ED and while in CT his left flank pain has steadily worsened and his blood pressures have steadily dropped.  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  He also had evidence of retroperitoneal bleed.  He underwent emergent repair of type I aortic dissection by Dr. Dorris Fetch.  He was able to move all extremities and was following simple commands by the morning of the first  postoperative day however unfortunately later he was not able to follow commands equally on the right and the left side of his upper extremities.  Neurology was consulted.  MRI scan of the brain was obtained which showed numerous bilateral acute infarcts right greater than left involving MCA and PCA territories as well as cerebellum.  He was found to have transient atrial fibrillation which was treated with amiodarone drip and he converted to normal sinus rhythm soon.  EEG showed no seizure activity.  CT angiogram showed partially visualized aortic dissection into the right brachiocephalic and also into the origin of the right common carotid artery.  CT angiogram of the brain showed mild intracranial atherosclerotic changes.  Echocardiogram showed ejection fraction of 60 to 65%.  LDL cholesterol was 42 mg percent.  Hemoglobin A1c was 5.5.  Patient on examination he has significant left hemiplegia.  He was transferred to inpatient rehab and showed gradual improvement and discharged home.  He is doing well and feels he is made almost a full recovery.  He still has some diminished fine motor skills in the left side.  He is ambulating with a cane and occasionally feels off balance. ' His balance is still not good and he stumbles and fell once a few days ago  l.  His main deficit to be cognitive slowing.  He has trouble finding words and has to think about answers.  Short-term memory is also poor.  Physical and Occupational Therapy still  getting some therapy for cognition.  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) 05/22/2022   Arthritis    Bradycardia    CVA (cerebral vascular accident) (HCC) 10/04/2022   Dysrhythmia    frequent PAC, for 20  years   GERD (gastroesophageal reflux disease)    occ, OTC   Hemorrhoids    History of hiatal hernia    Hyperlipidemia    Hypertension    S/P aortic dissection repair 09/18/2022   Seasonal allergies    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) 10/26/2022    Social History:  Social History   Socioeconomic History   Marital status: Married    Spouse name: Not on file   Number of children: Not on file   Years of education: Not on file   Highest education level: Not on file  Occupational History   Not on file  Tobacco Use   Smoking status: Never   Smokeless tobacco: Never  Vaping Use   Vaping Use: Never used  Substance and Sexual Activity   Alcohol use: Yes    Comment: 2 or 3 drinks on weekends   Drug use: No   Sexual activity: Not on file  Other Topics Concern   Not on file  Social History Narrative   Not on file   Social Determinants of Health   Financial Resource Strain: Not on file  Food Insecurity: No Food Insecurity (09/22/2022)   Hunger Vital Sign    Worried About Running Out of Food in the Last Year: Never true    Ran Out of Food in the Last Year: Never true  Transportation Needs: No Transportation Needs (09/22/2022)   PRAPARE - Administrator, Civil Service (Medical): No    Lack of Transportation (Non-Medical): No  Physical Activity: Not on file  Stress: Not on file  Social Connections: Not on file  Intimate Partner Violence: Not At Risk (09/22/2022)   Humiliation, Afraid, Rape, and Kick questionnaire    Fear of Current or Ex-Partner: No    Emotionally Abused: No    Physically Abused: No    Sexually Abused: No    Medications:   Current Outpatient Medications on File Prior to Visit  Medication Sig Dispense Refill   acetaminophen (TYLENOL) 325 MG tablet Take 1-2 tablets (325-650 mg total) by mouth every 4 (four) hours as needed for mild pain.     amiodarone (PACERONE) 200 MG tablet Take 1 tablet (200 mg total) by mouth daily. 30 tablet 0    aspirin 81 MG chewable tablet Chew 1 tablet (81 mg total) by mouth daily. 30 tablet 0   atorvastatin (LIPITOR) 40 MG tablet Take 1 tablet (40 mg total) by mouth daily. 30 tablet 0   citalopram (CELEXA) 10 MG tablet Take 1 tablet (10 mg total) by mouth daily. 30 tablet 0   doxazosin (CARDURA) 1 MG tablet Take 1 tablet (1 mg total) by mouth at bedtime. 30 tablet 0   feeding supplement (ENSURE ENLIVE / ENSURE PLUS) LIQD Take 237 mLs by mouth 2 (two) times daily between meals. 237 mL 12   metoprolol tartrate (LOPRESSOR) 25 MG tablet Take 0.5 tablets (12.5 mg total) by mouth 2 (two) times  daily. 15 tablet 0   senna-docusate (SENOKOT-S) 8.6-50 MG tablet Take 1 tablet by mouth at bedtime as needed for mild constipation. 30 tablet 0   tamsulosin (FLOMAX) 0.4 MG CAPS capsule Take 0.4 mg by mouth at bedtime.     No current facility-administered medications on file prior to visit.    Allergies:  No Known Allergies  Physical Exam General: well developed, well nourished pleasant elderly Caucasian male, seated, in no evident distress.  He has a indwelling Foley catheter. Head: head normocephalic and atraumatic.  Neck: supple with no carotid or supraclavicular bruits Cardiovascular: regular rate and rhythm, no murmurs Musculoskeletal: no deformity Skin:  no rash/petichiae Vascular:  Normal pulses all extremities Vitals:   11/22/22 0941  BP: 120/78  Pulse: (!) 101   Neurologic Exam Mental Status: Awake and fully alert. Oriented to place and time. Recent and remote memory intact. Attention span, concentration and fund of knowledge appropriate. Mood and affect appropriate.  Diminished recall 1/3.  Able to name only 5 animals which can walk on 4 legs.  Clock drawing 3/4. Cranial Nerves: Fundoscopic exam reveals sharp disc margins. Pupils equal, briskly reactive to light. Extraocular movements full without nystagmus. Visual fields full to confrontation. Hearing mildly diminished bilaterally t. Facial  sensation intact. Face, tongue, palate moves normally and symmetrically.  Motor: Normal bulk and tone. Normal strength in all tested extremity muscles except mild weakness of left grip and intrinsic hand muscles.  Orbits right over left upper extremity.  Fine finger movements are diminished on the left.. Sensory.: intact to touch ,pinprick .position and vibratory sensation.  Coordination: Rapid alternating movements normal in all extremities. Finger-to-nose and heel-to-shin performed accurately bilaterally. Gait and Station: Arises from chair without difficulty. Stance is normal. Gait demonstrates normal stride length and mild imbalance drifts to the left while turning..  Not able to heel, toe and tandem walk without difficulty.  Reflexes: 1+ and symmetric. Toes downgoing.   NIHSS  1 Modified Rankin  2   ASSESSMENT: 82 year old Caucasian male with multiple bilateral right greater than left MCA, PCA and cerebellar infarcts following surgery for aortic dissection and surgical repair in November 2023 significant improvement.  Mild residual cognitive impairment and left hemiparesis.     PLAN:I had a long d/w patient , wife and daughter about his recent embolic strokes, mild cognitive impairment, risk for recurrent stroke/TIAs, personally independently reviewed imaging studies and stroke evaluation results and answered questions.Continue Aspirin  for secondary stroke prevention and maintain strict control of hypertension with blood pressure goal below 130/90, diabetes with hemoglobin A1c goal below 6.5% and lipids with LDL cholesterol goal below 70 mg/dL. I also advised the patient to eat a healthy diet with plenty of whole grains, cereals, fruits and vegetables, exercise regularly and maintain ideal body weight .I recommend he use his cane at all times and we discussed fall prevention precautions.  I also encouraged him to increase participation in cognitively challenging activities like solving  crossword puzzles, playing bridge and sudoku.  We also discussed memory compensation strategies.  Check memory panel labs and EEG.  Followup in the future with me in months or call earlier if necessary.  I recommend he follow-up with urology for his indwelling Foley catheter removal. Greater than 50% of time during this prolonged 45 minute visit was spent on counseling,explanation of diagnosis, planning of further management, discussion with patient and family and coordination of care Antony Contras, MD Note: This document was prepared with digital dictation and possible smart phrase technology.  Any transcriptional errors that result from this process are unintentional

## 2022-11-22 NOTE — Patient Instructions (Signed)
I had a long d/w patient , wife and daughter about his recent embolic strokes, mild cognitive impairment, risk for recurrent stroke/TIAs, personally independently reviewed imaging studies and stroke evaluation results and answered questions.Continue Aspirin  for secondary stroke prevention and maintain strict control of hypertension with blood pressure goal below 130/90, diabetes with hemoglobin A1c goal below 6.5% and lipids with LDL cholesterol goal below 70 mg/dL. I also advised the patient to eat a healthy diet with plenty of whole grains, cereals, fruits and vegetables, exercise regularly and maintain ideal body weight .I recommend he use his cane at all times and we discussed fall prevention precautions.  I also encouraged him to increase participation in cognitively challenging activities like solving crossword puzzles, playing bridge and sudoku.  We also discussed memory compensation strategies.  Check memory panel labs and EEG.  Followup in the future with me in months or call earlier if necessary.  Memory Compensation Strategies  Use "WARM" strategy.  W= write it down  A= associate it  R= repeat it  M= make a mental note  2.   You can keep a Social worker.  Use a 3-ring notebook with sections for the following: calendar, important names and phone numbers,  medications, doctors' names/phone numbers, lists/reminders, and a section to journal what you did  each day.   3.    Use a calendar to write appointments down.  4.    Write yourself a schedule for the day.  This can be placed on the calendar or in a separate section of the Memory Notebook.  Keeping a  regular schedule can help memory.  5.    Use medication organizer with sections for each day or morning/evening pills.  You may need help loading it  6.    Keep a basket, or pegboard by the door.  Place items that you need to take out with you in the basket or on the pegboard.  You may also want to  include a message board for  reminders.  7.    Use sticky notes.  Place sticky notes with reminders in a place where the task is performed.  For example: " turn off the  stove" placed by the stove, "lock the door" placed on the door at eye level, " take your medications" on  the bathroom mirror or by the place where you normally take your medications.  8.    Use alarms/timers.  Use while cooking to remind yourself to check on food or as a reminder to take your medicine, or as a  reminder to make a call, or as a reminder to perform another task, etc.  Fall Prevention in the Home, Adult Falls can cause injuries and affect people of all ages. There are many simple things that you can do to make your home safe and to help prevent falls. Ask for help when making these changes, if needed. What actions can I take to prevent falls? General instructions Use good lighting in all rooms. Replace any light bulbs that burn out, turn on lights if it is dark, and use night-lights. Place frequently used items in easy-to-reach places. Lower the shelves around your home if necessary. Set up furniture so that there are clear paths around it. Avoid moving your furniture around. Remove throw rugs and other tripping hazards from the floor. Avoid walking on wet floors. Fix any uneven floor surfaces. Add color or contrast paint or tape to grab bars and handrails in your home. Place contrasting  color strips on the first and last steps of staircases. When you use a stepladder, make sure that it is completely opened and that the sides and supports are firmly locked. Have someone hold the ladder while you are using it. Do not climb a closed stepladder. Know where your pets are when moving through your home. What can I do in the bathroom?     Keep the floor dry. Immediately clean up any water that is on the floor. Remove soap buildup in the tub or shower regularly. Use nonskid mats or decals on the floor of the tub or shower. Attach bath mats  securely with double-sided, nonslip rug tape. If you need to sit down while you are in the shower, use a plastic, nonslip stool. Install grab bars by the toilet and in the tub and shower. Do not use towel bars as grab bars. What can I do in the bedroom? Make sure that a bedside light is easy to reach. Do not use oversized bedding that reaches the floor. Have a firm chair that has side arms to use for getting dressed. What can I do in the kitchen? Clean up any spills right away. If you need to reach for something above you, use a sturdy step stool that has a grab bar. Keep electrical cables out of the way. Do not use floor polish or wax that makes floors slippery. If you must use wax, make sure that it is non-skid floor wax. What can I do with my stairs? Do not leave any items on the stairs. Make sure that you have a light switch at the top and the bottom of the stairs. Have them installed if you do not have them. Make sure that there are handrails on both sides of the stairs. Fix handrails that are broken or loose. Make sure that handrails are as long as the staircases. Install non-slip stair treads on all stairs in your home. Avoid having throw rugs at the top or bottom of stairs, or secure the rugs with carpet tape to prevent them from moving. Choose a carpet design that does not hide the edge of steps on the stairs. Check any carpeting to make sure that it is firmly attached to the stairs. Fix any carpet that is loose or worn. What can I do on the outside of my home? Use bright outdoor lighting. Regularly repair the edges of walkways and driveways and fix any cracks. Remove high doorway thresholds. Trim any shrubbery on the main path into your home. Regularly check that handrails are securely fastened and in good repair. Both sides of all steps should have handrails. Install guardrails along the edges of any raised decks or porches. Clear walkways of debris and clutter, including tools  and rocks. Have leaves, snow, and ice cleared regularly. Use sand or salt on walkways during winter months. In the garage, clean up any spills right away, including grease or oil spills. What other actions can I take? Wear closed-toe shoes that fit well and support your feet. Wear shoes that have rubber soles or low heels. Use mobility aids as needed, such as canes, walkers, scooters, and crutches. Review your medicines with your health care provider. Some medicines can cause dizziness or changes in blood pressure, which increase your risk of falling. Talk with your health care provider about other ways that you can decrease your risk of falls. This may include working with a physical therapist or trainer to improve your strength, balance, and endurance. Where  to find more information Centers for Disease Control and Prevention, [employer]: FootballExhibition.com.br [employer] on Aging: https://walker.com/ Contact a health care provider if: You are afraid of falling at home. You feel weak, drowsy, or dizzy at home. You fall at home. Summary There are many simple things that you can do to make your home safe and to help prevent falls. Ways to make your home safe include removing tripping hazards and installing grab bars in the bathroom. Ask for help when making these changes in your home. This information is not intended to replace advice given to you by your health care provider. Make sure you discuss any questions you have with your health care provider. Document Revised: 08/02/2021 Document Reviewed: 06/03/2020 Elsevier Patient Education  2023 [employer].

## 2022-11-23 ENCOUNTER — Encounter: Payer: Medicare Other | Admitting: Speech Pathology

## 2022-11-23 LAB — DEMENTIA PANEL
Homocysteine: 14.8 umol/L (ref 0.0–19.2)
RPR Ser Ql: NONREACTIVE
TSH: 4.5 u[IU]/mL (ref 0.450–4.500)
Vitamin B-12: 426 pg/mL (ref 232–1245)

## 2022-11-24 ENCOUNTER — Encounter: Payer: Medicare Other | Attending: Registered Nurse | Admitting: Registered Nurse

## 2022-11-24 ENCOUNTER — Ambulatory Visit: Payer: Medicare Other | Admitting: Neurology

## 2022-11-24 ENCOUNTER — Other Ambulatory Visit: Payer: Self-pay | Admitting: Thoracic Surgery (Cardiothoracic Vascular Surgery)

## 2022-11-24 VITALS — BP 128/84 | HR 84 | Ht 72.0 in | Wt 174.0 lb

## 2022-11-24 DIAGNOSIS — R413 Other amnesia: Secondary | ICD-10-CM

## 2022-11-24 DIAGNOSIS — I63031 Cerebral infarction due to thrombosis of right carotid artery: Secondary | ICD-10-CM | POA: Diagnosis not present

## 2022-11-24 DIAGNOSIS — I10 Essential (primary) hypertension: Secondary | ICD-10-CM

## 2022-11-24 DIAGNOSIS — K5904 Chronic idiopathic constipation: Secondary | ICD-10-CM

## 2022-11-24 DIAGNOSIS — R4182 Altered mental status, unspecified: Secondary | ICD-10-CM | POA: Diagnosis not present

## 2022-11-24 DIAGNOSIS — Z9889 Other specified postprocedural states: Secondary | ICD-10-CM

## 2022-11-24 DIAGNOSIS — R339 Retention of urine, unspecified: Secondary | ICD-10-CM

## 2022-11-24 DIAGNOSIS — I4891 Unspecified atrial fibrillation: Secondary | ICD-10-CM | POA: Diagnosis not present

## 2022-11-24 NOTE — Progress Notes (Deleted)
Subjective:    Patient ID: James Estrada, male    DOB: 04/19/1941, 82 y.o.   MRN: [id number]  HPI  Pain Inventory Average Pain {NUMBERS; 0-10:5044} Pain Right Now {NUMBERS; 0-10:5044} My pain is {PAIN DESCRIPTION:21022940}  In the last 24 hours, has pain interfered with the following? General activity {NUMBERS; 0-10:5044} Relation with others {NUMBERS; 0-10:5044} Enjoyment of life {NUMBERS; 0-10:5044} What TIME of day is your pain at its worst? {time of day:24191} Sleep (in general) {BHH GOOD/FAIR/POOR:22877}  Pain is worse with: {ACTIVITIES:21022942} Pain improves with: {PAIN IMPROVES ASNK:53976734} Relief from Meds: {NUMBERS; 0-10:5044}  {MOBILITY LPF:79024097}  {FUNCTION:21022946}  {NEURO/PSYCH:21022948}  {CPRM PRIOR STUDIES:21022953}  {CPRM PHYSICIANS INVOLVED IN YOUR CARE:21022954}    Family History  Problem Relation Age of Onset   Heart disease Mother    Coronary artery disease Mother    Aneurysm Father    Diabetes Sister    Social History   Socioeconomic History   Marital status: Married    Spouse name: Not on file   Number of children: Not on file   Years of education: Not on file   Highest education level: Not on file  Occupational History   Not on file  Tobacco Use   Smoking status: Never   Smokeless tobacco: Never  Vaping Use   Vaping Use: Never used  Substance and Sexual Activity   Alcohol use: Yes    Comment: 2 or 3 drinks on weekends   Drug use: No   Sexual activity: Not on file  Other Topics Concern   Not on file  Social History Narrative   Not on file   Social Determinants of Health   Financial Resource Strain: Not on file  Food Insecurity: No Food Insecurity (09/22/2022)   Hunger Vital Sign    Worried About Running Out of Food in the Last Year: Never true    Ran Out of Food in the Last Year: Never true  Transportation Needs: No Transportation Needs (09/22/2022)   PRAPARE - Hydrologist  (Medical): No    Lack of Transportation (Non-Medical): No  Physical Activity: Not on file  Stress: Not on file  Social Connections: Not on file   Past Surgical History:  Procedure Laterality Date   ABDOMINAL AORTIC ENDOVASCULAR STENT GRAFT N/A 05/04/2020   Procedure: ABDOMINAL AORTIC ENDOVASCULAR STENT GRAFT;  Surgeon: [name], MD;  Location: [facility];  Service: Vascular;  Laterality: N/A;   CATARACT EXTRACTION Bilateral 2009   DIAGNOSTIC LAPAROSCOPY     laparoscopic hernia repair   EYE SURGERY Bilateral    cataract removal   HERNIA REPAIR Bilateral 1999, 2006   JOINT REPLACEMENT Right ~2018   hip replacement   pheochromocytoma  1993   PROSTATECTOMY N/A 05/15/2013   Procedure: PROSTATECTOMY RETROPUBIC; SIMPLE OPEN PROSTATECTOMY;  Surgeon: [name], MD;  Location: [facility];  Service: Urology;  Laterality: N/A;   REPAIR OF ACUTE ASCENDING THORACIC AORTIC DISSECTION N/A 09/17/2022   Procedure: REPAIR OF ACUTE ASCENDING THORACIC AORTIC DISSECTION USING 28 MM HEMASHIELD PLATINUM VASCULAR GRAFT;  Surgeon: [name], MD;  Location: [facility];  Service: Vascular;  Laterality: N/A;  Median sternotomy   TOTAL ELBOW REPLACEMENT Left    > 30 years ago   TOTAL HIP ARTHROPLASTY Right 09/08/2017   Procedure: RIGHT TOTAL HIP ARTHROPLASTY ANTERIOR APPROACH;  Surgeon: [name], MD;  Location: [facility];  Service: Orthopedics;  Laterality: Right;   ULTRASOUND GUIDANCE FOR VASCULAR ACCESS Bilateral 05/04/2020  Procedure: ULTRASOUND GUIDANCE FOR VASCULAR ACCESS;  Surgeon: [name], MD;  Location: [facility];  Service: Vascular;  Laterality: Bilateral;   Past Medical History:  Diagnosis Date   AAA (abdominal aortic aneurysm) ([facility])    last u/s done 07/18/17    Aneurysm of left internal iliac artery ([facility]) 05/22/2022   Arthritis    Bradycardia    CVA (cerebral vascular accident) ([facility]) 10/04/2022   Dysrhythmia    frequent PAC, for 20 years   GERD (gastroesophageal reflux disease)     occ, OTC   Hemorrhoids    History of hiatal hernia    Hyperlipidemia    Hypertension    S/P aortic dissection repair 09/18/2022   Seasonal allergies    Type 1 dissection of thoracic aorta ([facility]) 10/26/2022   There were no vitals taken for this visit.  Opioid Risk Score:   Fall Risk Score:  `1  Depression screen [facility] 2/9     07/17/2015   10:55 AM  Depression screen PH[facility] 2/9  Decreased Interest 0  Down, Depressed, Hopeless 0  PHQ - 2 Score 0     Review of Systems     Objective:   Physical Exam        Assessment & Plan:

## 2022-11-24 NOTE — Progress Notes (Signed)
Subjective:    Patient ID: James Estrada, male    DOB: 04-Jan-1941, 82 y.o.   MRN: [id number]  HPI: James Estrada is a 82 y.o. male who is here for HFU appointment for F/U of his CVA, Functional Deficits secondary to Right Brain Strokes, Atrial Fibrillation, Essential Hypertension, Acute Urinary Retention and Chronic Constipation. He presented to [facility] on 09/17/2022 via [facility] with complaints of bilateral lower extremities weakness and left upper back pain.   Dr, Dorris Fetch H&P Chief Complaint: CP, near syncope HPI: 82 year old man with past history of hypertension, hyperlipidemia, bradycardia, frequent PACs, and AAA s/p stent graft.  Around 4 PM today had sudden onset of bilateral leg weakness and collapsed.  Also chest/ left upper back pain.  Came to ED.  Head CT and CT angio done.  CT angio shows type dissection with thrombosed false lumen.  CT Head: WO Contrast IMPRESSION: No evidence of acute intracranial abnormality.   Mild chronic small vessel image changes within the cerebral white matter.   Mild generalized cerebral atrophy.  CT Angio Narrative & Impression  CLINICAL DATA:  Acute aortic syndrome (AAS) suspected   EXAM: CT ANGIOGRAPHY CHEST, ABDOMEN AND PELVIS   TECHNIQUE: Non-contrast CT of the chest was initially obtained. Multidetector CT imaging through the chest, abdomen and pelvis was performed using the standard protocol during bolus administration of intravenous contrast. Multiplanar reconstructed images and MIPs were obtained and reviewed to evaluate the vascular anatomy.   RADIATION DOSE REDUCTION: This exam was performed according to the departmental dose-optimization program which includes automated exposure control, adjustment of the mA and/or kV according to patient size and/or use of iterative reconstruction technique.   CONTRAST:  76mL OMNIPAQUE IOHEXOL 350 MG/ML SOLN   COMPARISON:  CT a 07/21/2022   FINDINGS: CTA CHEST FINDINGS    Cardiovascular: New from prior exam is an acute type A thoracic aortic dissection extending throughout the course of the ascending aorta. There is small foci of extraluminal contrast adjacent to the ascending aorta suggestive of active hemorrhage. Small hemopericardium. The false lumen appears thrombosed. The dissection involves the origin of the brachiocephalic artery which is severely narrowed. Dissection involves the origin of the left common carotid artery. The left subclavian artery arises from the true lumen. The true lumen is greater than 50% narrowed involving the descending thoracic aorta. There is no central pulmonary embolus. Coronary artery calcifications are seen.   Mediastinum/Nodes: Stranding in the anterior mediastinum related to aortic dissection. There is no obvious bulky adenopathy. The esophagus is decompressed.   Lungs/Pleura: Hypoventilatory atelectasis dependently. No pleural fluid. Trachea and central airways are patent.   Musculoskeletal: There are no acute or suspicious osseous abnormalities.   Review of the MIP images confirms the above findings.   CTA ABDOMEN AND PELVIS FINDINGS   VASCULAR   Aorta: Endovascular repair prior abdominal aortic aneurysm. The stent graft is patent. There is increased size of the excluded aneurysm sac currently 4.5 cm, previously 3.5 cm. There are tiny foci of high density in the aneurysm sac series 7, image 204, that were not definitively seen on prior. The proximal aspect of the stent originates just below the renal arteries. The distal aspect of the stents in the common iliac arteries. There is moderate left retroperitoneal hemorrhage outside the aneurysm sac, but no definite extraluminal active bleeding. This retroperitoneal hemorrhage extends to the anterior aspect of the left psoas muscle as well as medial to the left kidney.   Celiac: Arises from  the true lumen, no stenosis. Dissection does not involve the  celiac vessels. Similar ectasia of the celiac artery at 12 mm.   SMA: Arises from the true lumen. No dissection involves the SMA. The distal branches are patent.   Renals: The right renal artery arises from the true lumen and is patent. There is narrowing at the origin of the left renal artery with diminished left renal perfusion. Stranding is seen adjacent to the proximal left renal artery which is irregular.   IMA: Excluded by the graft, with reconstitution distally.   Inflow: The right iliac limb terminates in the common iliac artery. The diameter of the distal common iliac artery beyond the graft measures 2.3 cm, previously 2.4 cm. 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 Distal arteries including into the right lower extremity are patent. There is no dissection involvement.   The left iliac limb terminates in the common iliac artery. The diameter of the distal common iliac artery beyond the graft measures 2.5 cm, previously 2.6 cm. Aneurysm of the internal iliac artery measures 3.2 cm, previously 3.1 cm. The distal arteries are patent without dissection component. There is no evidence of active extravasation involving the iliac arteries as source of left retroperitoneal hemorrhage.   Veins: Not well assessed on this arterial phase exam.   Review of the MIP images confirms the above findings.   NON-VASCULAR   Hepatobiliary: Similar low-density liver lesions, incompletely characterized on this arterial phase exam but stable from prior. Unremarkable gallbladder.   Pancreas: No ductal dilatation or inflammation.   Spleen: Normal in size. Surgical clip is seen adjacent to the spleen.   Adrenals/Urinary Tract: No adrenal nodules. Clips adjacent to the left adrenal gland. There is a small amount of hemorrhage medial to the left kidney. Decreased perfusion to the left kidney. No hydronephrosis of either kidney. Unremarkable urinary  bladder.   Stomach/Bowel: Small hiatal hernia. No bowel obstruction or inflammation. Mild sigmoid diverticulosis without diverticulitis.   Lymphatic: No bulky adenopathy.   Reproductive: Imminent prostate.   Other: There is left retroperitoneal hemorrhage note extends from the level of the left mid kidney to the iliac bifurcation. There is no active extravasation within the area of hemorrhage, although source may be the left iliac system. This hemorrhage abuts the anterior aspect of the left iliopsoas muscle. Probable fat containing bilateral inguinal hernias. Tiny fat containing umbilical hernia.   Musculoskeletal: Degenerative disc disease in the lumbar spine. Right hip arthroplasty. No acute osseous findings.   Review of the MIP images confirms the above findings.   IMPRESSION: 1. Acute type A thoracic aortic dissection, thrombosis of the false lumen. There is active extravasation adjacent to the ascending aorta as well as small volume hemopericardium. Origin cardiothoracic surgery consultation is needed. 2. Dissection extends into the brachiocephalic artery causing severe narrowing at the origin. Dissection extends into the left common carotid artery origin. 3. Prior stent graft repair of abdominal aortic aneurysm. The residual aneurysm sac has increased in size, faint internal hyperdensities may represent endoleak. 4. Moderate amount of left retroperitoneal hemorrhage extending from the iliac vasculature to the level of the left kidney. There is no active extravasation within this retroperitoneal hemorrhage. Source is uncertain, but may represent the left iliac landing zone. 5. Bilateral internal artery aneurysms, essentially stable. 6. Decreased perfusion of the left kidney, narrowing at the origin of the left renal artery.     Neurology Consulted,  He underwent  Emergent Aortic Dissection Repair  by Dr Roxan Hockey: on 09/17/2022 REPAIR OF ACUTE ASCENDING THORACIC  AORTIC DISSECTION USING 28 MM HEMASHIELD PLATINUM VASCULAR GRAFT    He was admitted to inpatient rehabilitation on 10/04/2022 and he was discharged home on 10/20/2022. He is receiving home health therapy with [facility]. He denies any pain. He rates his pain 0. Also reports he has good appetite.    Pain Inventory Average Pain 0 Pain Right Now 0 My pain is  no pain  LOCATION OF PAIN  no pain  BOWEL Number of stools per week: 2 Oral laxative use Yes  Type of laxative miralax   BLADDER Foley  Able to self cath No     Mobility walk with assistance use a cane use a walker how many minutes can you walk? 30 minutes ability to climb steps?  yes do you drive?  no Do you have any goals in this area?  yes  Function retired  Neuro/Psych bladder control problems bowel control problems confusion depression anxiety  Prior Studies Any changes since last visit?  no  Physicians involved in your care Any changes since last visit?  no   Family History  Problem Relation Age of Onset   Heart disease Mother    Coronary artery disease Mother    Aneurysm Father    Diabetes Sister    Social History   Socioeconomic History   Marital status: Married    Spouse name: Not on file   Number of children: Not on file   Years of education: Not on file   Highest education level: Not on file  Occupational History   Not on file  Tobacco Use   Smoking status: Never   Smokeless tobacco: Never  Vaping Use   Vaping Use: Never used  Substance and Sexual Activity   Alcohol use: Yes    Comment: 2 or 3 drinks on weekends   Drug use: No   Sexual activity: Not on file  Other Topics Concern   Not on file  Social History Narrative   Not on file   Social Determinants of Health   Financial Resource Strain: Not on file  Food Insecurity: No Food Insecurity (09/22/2022)   Hunger Vital Sign    Worried About Running Out of Food in the Last Year: Never true    Ran Out of Food in  the Last Year: Never true  Transportation Needs: No Transportation Needs (09/22/2022)   PRAPARE - Hydrologist (Medical): No    Lack of Transportation (Non-Medical): No  Physical Activity: Not on file  Stress: Not on file  Social Connections: Not on file   Past Surgical History:  Procedure Laterality Date   ABDOMINAL AORTIC ENDOVASCULAR STENT GRAFT N/A 05/04/2020   Procedure: ABDOMINAL AORTIC ENDOVASCULAR STENT GRAFT;  Surgeon: [name], MD;  Location: [place];  Service: Vascular;  Laterality: N/A;   CATARACT EXTRACTION Bilateral 2009   DIAGNOSTIC LAPAROSCOPY     laparoscopic hernia repair   EYE SURGERY Bilateral    cataract removal   HERNIA REPAIR Bilateral 1999, 2006   JOINT REPLACEMENT Right ~2018   hip replacement   pheochromocytoma  1993   PROSTATECTOMY N/A 05/15/2013   Procedure: PROSTATECTOMY RETROPUBIC; SIMPLE OPEN PROSTATECTOMY;  Surgeon: [name], MD;  Location: [facility];  Service: Urology;  Laterality: N/A;   REPAIR OF ACUTE ASCENDING THORACIC AORTIC DISSECTION N/A 09/17/2022   Procedure: REPAIR OF ACUTE ASCENDING THORACIC AORTIC DISSECTION USING 28 MM HEMASHIELD PLATINUM VASCULAR  GRAFT;  Surgeon: Loreli Slot, MD;  Location: [facility];  Service: Vascular;  Laterality: N/A;  Median sternotomy   TOTAL ELBOW REPLACEMENT Left    > 30 years ago   TOTAL HIP ARTHROPLASTY Right 09/08/2017   Procedure: RIGHT TOTAL HIP ARTHROPLASTY ANTERIOR APPROACH;  Surgeon: Kathryne Hitch, MD;  Location: [facility];  Service: Orthopedics;  Laterality: Right;   ULTRASOUND GUIDANCE FOR VASCULAR ACCESS Bilateral 05/04/2020   Procedure: ULTRASOUND GUIDANCE FOR VASCULAR ACCESS;  Surgeon: Larina Earthly, MD;  Location: [facility];  Service: Vascular;  Laterality: Bilateral;   Past Medical History:  Diagnosis Date   AAA (abdominal aortic aneurysm) (HCC)    last u/s done 07/18/17    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  Opioid Risk Score:   Fall Risk Score:  `1  Depression screen [facility] 2/9     07/17/2015   10:55 AM  Depression screen PHQ 2/9  Decreased Interest 0  Down, Depressed, Hopeless 0  PHQ - 2 Score 0      Review of Systems  Gastrointestinal:  Positive for constipation, diarrhea and vomiting.  All other systems reviewed and are negative.     Objective:   Physical Exam Vitals and nursing note reviewed.  Constitutional:      Appearance: Normal appearance.  Cardiovascular:     Rate and Rhythm: Normal rate and regular rhythm.     Pulses: Normal pulses.     Heart sounds: Normal heart sounds.  Pulmonary:     Effort: Pulmonary effort is normal.     Breath sounds: Normal breath sounds.  Genitourinary:    Comments: Wearing Leg Bag: Yellow urine noted Musculoskeletal:     Cervical back: Normal range of motion and neck supple.     Comments: Normal Muscle Bulk and Muscle Testing Reveals:  Upper Extremities: Full ROM and Muscle Strength 5/5 Left Upper Extremity: Full ROM and Muscle Strength 5/5  Lower Extremities: Full ROM and Muscle Strength 5/5 Arises from Table slowly using cane for support Narrow Based Gait     Skin:    General: Skin is warm and dry.  Neurological:     Mental Status: He is alert and oriented to person, place, and time.  Psychiatric:        Mood and Affect: Mood normal.        Behavior: Behavior normal.         Assessment & Plan:  1.CVA, Functional Deficits secondary to Right Brain Strokes: Continue Home Health Therapy with [facility]. Dr. Pearlean Brownie following.    2. Atrial Fibrillation: Continue Current medication regimen :Cardiology following. Continue to Monitor. 3.  Essential Hypertension: Continue current medication regimen 4. Acute Urinary Retention: Wearing leg bag: Clear yellow urine. Has a scheduled appointment with Urology. 5. Chronic Constipation.: Continue  Miralax: Discussed Health Diet Regimen. He will F/ U with his PCP. He verbalizes understanding.   F/U with Dr Wynn Banker in 4-6 weeks

## 2022-11-25 ENCOUNTER — Encounter: Payer: Self-pay | Admitting: Registered Nurse

## 2022-11-26 DIAGNOSIS — E785 Hyperlipidemia, unspecified: Secondary | ICD-10-CM | POA: Diagnosis not present

## 2022-11-26 DIAGNOSIS — I5032 Chronic diastolic (congestive) heart failure: Secondary | ICD-10-CM | POA: Diagnosis not present

## 2022-11-26 DIAGNOSIS — N179 Acute kidney failure, unspecified: Secondary | ICD-10-CM | POA: Diagnosis not present

## 2022-11-26 DIAGNOSIS — I69152 Hemiplegia and hemiparesis following nontraumatic intracerebral hemorrhage affecting left dominant side: Secondary | ICD-10-CM | POA: Diagnosis not present

## 2022-11-26 DIAGNOSIS — R1310 Dysphagia, unspecified: Secondary | ICD-10-CM | POA: Diagnosis not present

## 2022-11-26 DIAGNOSIS — I480 Paroxysmal atrial fibrillation: Secondary | ICD-10-CM | POA: Diagnosis not present

## 2022-11-26 DIAGNOSIS — I110 Hypertensive heart disease with heart failure: Secondary | ICD-10-CM | POA: Diagnosis not present

## 2022-11-26 DIAGNOSIS — Z7982 Long term (current) use of aspirin: Secondary | ICD-10-CM | POA: Diagnosis not present

## 2022-11-26 DIAGNOSIS — I714 Abdominal aortic aneurysm, without rupture, unspecified: Secondary | ICD-10-CM | POA: Diagnosis not present

## 2022-11-26 DIAGNOSIS — K449 Diaphragmatic hernia without obstruction or gangrene: Secondary | ICD-10-CM | POA: Diagnosis not present

## 2022-11-26 DIAGNOSIS — I701 Atherosclerosis of renal artery: Secondary | ICD-10-CM | POA: Diagnosis not present

## 2022-11-26 DIAGNOSIS — D62 Acute posthemorrhagic anemia: Secondary | ICD-10-CM | POA: Diagnosis not present

## 2022-11-26 DIAGNOSIS — Z466 Encounter for fitting and adjustment of urinary device: Secondary | ICD-10-CM | POA: Diagnosis not present

## 2022-11-26 DIAGNOSIS — R32 Unspecified urinary incontinence: Secondary | ICD-10-CM | POA: Diagnosis not present

## 2022-11-26 DIAGNOSIS — M1990 Unspecified osteoarthritis, unspecified site: Secondary | ICD-10-CM | POA: Diagnosis not present

## 2022-11-26 DIAGNOSIS — K219 Gastro-esophageal reflux disease without esophagitis: Secondary | ICD-10-CM | POA: Diagnosis not present

## 2022-11-28 DIAGNOSIS — R339 Retention of urine, unspecified: Secondary | ICD-10-CM | POA: Diagnosis not present

## 2022-11-28 DIAGNOSIS — N401 Enlarged prostate with lower urinary tract symptoms: Secondary | ICD-10-CM | POA: Diagnosis not present

## 2022-11-28 DIAGNOSIS — N319 Neuromuscular dysfunction of bladder, unspecified: Secondary | ICD-10-CM | POA: Diagnosis not present

## 2022-11-29 DIAGNOSIS — I69152 Hemiplegia and hemiparesis following nontraumatic intracerebral hemorrhage affecting left dominant side: Secondary | ICD-10-CM | POA: Diagnosis not present

## 2022-11-29 DIAGNOSIS — I110 Hypertensive heart disease with heart failure: Secondary | ICD-10-CM | POA: Diagnosis not present

## 2022-11-29 DIAGNOSIS — Z466 Encounter for fitting and adjustment of urinary device: Secondary | ICD-10-CM | POA: Diagnosis not present

## 2022-11-29 DIAGNOSIS — I480 Paroxysmal atrial fibrillation: Secondary | ICD-10-CM | POA: Diagnosis not present

## 2022-11-29 DIAGNOSIS — Z7982 Long term (current) use of aspirin: Secondary | ICD-10-CM | POA: Diagnosis not present

## 2022-11-29 DIAGNOSIS — R1310 Dysphagia, unspecified: Secondary | ICD-10-CM | POA: Diagnosis not present

## 2022-11-29 DIAGNOSIS — I714 Abdominal aortic aneurysm, without rupture, unspecified: Secondary | ICD-10-CM | POA: Diagnosis not present

## 2022-11-29 DIAGNOSIS — K449 Diaphragmatic hernia without obstruction or gangrene: Secondary | ICD-10-CM | POA: Diagnosis not present

## 2022-11-29 DIAGNOSIS — M1990 Unspecified osteoarthritis, unspecified site: Secondary | ICD-10-CM | POA: Diagnosis not present

## 2022-11-29 DIAGNOSIS — D62 Acute posthemorrhagic anemia: Secondary | ICD-10-CM | POA: Diagnosis not present

## 2022-11-29 DIAGNOSIS — K219 Gastro-esophageal reflux disease without esophagitis: Secondary | ICD-10-CM | POA: Diagnosis not present

## 2022-11-29 DIAGNOSIS — I701 Atherosclerosis of renal artery: Secondary | ICD-10-CM | POA: Diagnosis not present

## 2022-11-29 DIAGNOSIS — R32 Unspecified urinary incontinence: Secondary | ICD-10-CM | POA: Diagnosis not present

## 2022-11-29 DIAGNOSIS — E785 Hyperlipidemia, unspecified: Secondary | ICD-10-CM | POA: Diagnosis not present

## 2022-11-29 DIAGNOSIS — I5032 Chronic diastolic (congestive) heart failure: Secondary | ICD-10-CM | POA: Diagnosis not present

## 2022-11-29 DIAGNOSIS — N179 Acute kidney failure, unspecified: Secondary | ICD-10-CM | POA: Diagnosis not present

## 2022-12-01 ENCOUNTER — Telehealth: Payer: Self-pay | Admitting: *Deleted

## 2022-12-01 ENCOUNTER — Encounter: Payer: Self-pay | Admitting: Neurology

## 2022-12-01 DIAGNOSIS — D62 Acute posthemorrhagic anemia: Secondary | ICD-10-CM | POA: Diagnosis not present

## 2022-12-01 DIAGNOSIS — N179 Acute kidney failure, unspecified: Secondary | ICD-10-CM | POA: Diagnosis not present

## 2022-12-01 DIAGNOSIS — I110 Hypertensive heart disease with heart failure: Secondary | ICD-10-CM | POA: Diagnosis not present

## 2022-12-01 DIAGNOSIS — R1310 Dysphagia, unspecified: Secondary | ICD-10-CM | POA: Diagnosis not present

## 2022-12-01 DIAGNOSIS — I701 Atherosclerosis of renal artery: Secondary | ICD-10-CM | POA: Diagnosis not present

## 2022-12-01 DIAGNOSIS — K449 Diaphragmatic hernia without obstruction or gangrene: Secondary | ICD-10-CM | POA: Diagnosis not present

## 2022-12-01 DIAGNOSIS — R32 Unspecified urinary incontinence: Secondary | ICD-10-CM | POA: Diagnosis not present

## 2022-12-01 DIAGNOSIS — K219 Gastro-esophageal reflux disease without esophagitis: Secondary | ICD-10-CM | POA: Diagnosis not present

## 2022-12-01 DIAGNOSIS — I480 Paroxysmal atrial fibrillation: Secondary | ICD-10-CM | POA: Diagnosis not present

## 2022-12-01 DIAGNOSIS — Z466 Encounter for fitting and adjustment of urinary device: Secondary | ICD-10-CM | POA: Diagnosis not present

## 2022-12-01 DIAGNOSIS — I69152 Hemiplegia and hemiparesis following nontraumatic intracerebral hemorrhage affecting left dominant side: Secondary | ICD-10-CM | POA: Diagnosis not present

## 2022-12-01 DIAGNOSIS — Z7982 Long term (current) use of aspirin: Secondary | ICD-10-CM | POA: Diagnosis not present

## 2022-12-01 DIAGNOSIS — M1990 Unspecified osteoarthritis, unspecified site: Secondary | ICD-10-CM | POA: Diagnosis not present

## 2022-12-01 DIAGNOSIS — E785 Hyperlipidemia, unspecified: Secondary | ICD-10-CM | POA: Diagnosis not present

## 2022-12-01 DIAGNOSIS — I714 Abdominal aortic aneurysm, without rupture, unspecified: Secondary | ICD-10-CM | POA: Diagnosis not present

## 2022-12-01 DIAGNOSIS — I5032 Chronic diastolic (congestive) heart failure: Secondary | ICD-10-CM | POA: Diagnosis not present

## 2022-12-01 NOTE — Telephone Encounter (Signed)
Pt wife call for husband eeg results. [phone] please call

## 2022-12-04 NOTE — Progress Notes (Signed)
Kindly inform the patient that lab work for reversible causes of memory loss was all quite satisfactory

## 2022-12-04 NOTE — Progress Notes (Signed)
Kindly inform the patient that EEG or brainwave study was normal.  No seizure activity noted.

## 2022-12-06 DIAGNOSIS — E785 Hyperlipidemia, unspecified: Secondary | ICD-10-CM | POA: Diagnosis not present

## 2022-12-06 DIAGNOSIS — I5032 Chronic diastolic (congestive) heart failure: Secondary | ICD-10-CM | POA: Diagnosis not present

## 2022-12-06 DIAGNOSIS — K219 Gastro-esophageal reflux disease without esophagitis: Secondary | ICD-10-CM | POA: Diagnosis not present

## 2022-12-06 DIAGNOSIS — I701 Atherosclerosis of renal artery: Secondary | ICD-10-CM | POA: Diagnosis not present

## 2022-12-06 DIAGNOSIS — K449 Diaphragmatic hernia without obstruction or gangrene: Secondary | ICD-10-CM | POA: Diagnosis not present

## 2022-12-06 DIAGNOSIS — I10 Essential (primary) hypertension: Secondary | ICD-10-CM | POA: Diagnosis not present

## 2022-12-06 DIAGNOSIS — R1310 Dysphagia, unspecified: Secondary | ICD-10-CM | POA: Diagnosis not present

## 2022-12-06 DIAGNOSIS — Z466 Encounter for fitting and adjustment of urinary device: Secondary | ICD-10-CM | POA: Diagnosis not present

## 2022-12-06 DIAGNOSIS — I69152 Hemiplegia and hemiparesis following nontraumatic intracerebral hemorrhage affecting left dominant side: Secondary | ICD-10-CM | POA: Diagnosis not present

## 2022-12-06 DIAGNOSIS — D62 Acute posthemorrhagic anemia: Secondary | ICD-10-CM | POA: Diagnosis not present

## 2022-12-06 DIAGNOSIS — R339 Retention of urine, unspecified: Secondary | ICD-10-CM | POA: Diagnosis not present

## 2022-12-06 DIAGNOSIS — I110 Hypertensive heart disease with heart failure: Secondary | ICD-10-CM | POA: Diagnosis not present

## 2022-12-06 DIAGNOSIS — N179 Acute kidney failure, unspecified: Secondary | ICD-10-CM | POA: Diagnosis not present

## 2022-12-06 DIAGNOSIS — I69354 Hemiplegia and hemiparesis following cerebral infarction affecting left non-dominant side: Secondary | ICD-10-CM | POA: Diagnosis not present

## 2022-12-06 DIAGNOSIS — M1990 Unspecified osteoarthritis, unspecified site: Secondary | ICD-10-CM | POA: Diagnosis not present

## 2022-12-06 DIAGNOSIS — I6340 Cerebral infarction due to embolism of unspecified cerebral artery: Secondary | ICD-10-CM | POA: Diagnosis not present

## 2022-12-06 DIAGNOSIS — Z7982 Long term (current) use of aspirin: Secondary | ICD-10-CM | POA: Diagnosis not present

## 2022-12-06 DIAGNOSIS — I480 Paroxysmal atrial fibrillation: Secondary | ICD-10-CM | POA: Diagnosis not present

## 2022-12-06 DIAGNOSIS — R32 Unspecified urinary incontinence: Secondary | ICD-10-CM | POA: Diagnosis not present

## 2022-12-06 DIAGNOSIS — I714 Abdominal aortic aneurysm, without rupture, unspecified: Secondary | ICD-10-CM | POA: Diagnosis not present

## 2022-12-07 DIAGNOSIS — R339 Retention of urine, unspecified: Secondary | ICD-10-CM | POA: Diagnosis not present

## 2022-12-13 ENCOUNTER — Telehealth: Payer: Self-pay | Admitting: Physical Medicine & Rehabilitation

## 2022-12-13 DIAGNOSIS — E785 Hyperlipidemia, unspecified: Secondary | ICD-10-CM | POA: Diagnosis not present

## 2022-12-13 DIAGNOSIS — I110 Hypertensive heart disease with heart failure: Secondary | ICD-10-CM | POA: Diagnosis not present

## 2022-12-13 DIAGNOSIS — I69152 Hemiplegia and hemiparesis following nontraumatic intracerebral hemorrhage affecting left dominant side: Secondary | ICD-10-CM | POA: Diagnosis not present

## 2022-12-13 DIAGNOSIS — N179 Acute kidney failure, unspecified: Secondary | ICD-10-CM | POA: Diagnosis not present

## 2022-12-13 DIAGNOSIS — M1990 Unspecified osteoarthritis, unspecified site: Secondary | ICD-10-CM | POA: Diagnosis not present

## 2022-12-13 DIAGNOSIS — Z466 Encounter for fitting and adjustment of urinary device: Secondary | ICD-10-CM | POA: Diagnosis not present

## 2022-12-13 DIAGNOSIS — R1310 Dysphagia, unspecified: Secondary | ICD-10-CM | POA: Diagnosis not present

## 2022-12-13 DIAGNOSIS — Z7982 Long term (current) use of aspirin: Secondary | ICD-10-CM | POA: Diagnosis not present

## 2022-12-13 DIAGNOSIS — I5032 Chronic diastolic (congestive) heart failure: Secondary | ICD-10-CM | POA: Diagnosis not present

## 2022-12-13 DIAGNOSIS — R32 Unspecified urinary incontinence: Secondary | ICD-10-CM | POA: Diagnosis not present

## 2022-12-13 DIAGNOSIS — I714 Abdominal aortic aneurysm, without rupture, unspecified: Secondary | ICD-10-CM | POA: Diagnosis not present

## 2022-12-13 DIAGNOSIS — I701 Atherosclerosis of renal artery: Secondary | ICD-10-CM | POA: Diagnosis not present

## 2022-12-13 DIAGNOSIS — D62 Acute posthemorrhagic anemia: Secondary | ICD-10-CM | POA: Diagnosis not present

## 2022-12-13 DIAGNOSIS — K449 Diaphragmatic hernia without obstruction or gangrene: Secondary | ICD-10-CM | POA: Diagnosis not present

## 2022-12-13 DIAGNOSIS — K219 Gastro-esophageal reflux disease without esophagitis: Secondary | ICD-10-CM | POA: Diagnosis not present

## 2022-12-13 DIAGNOSIS — I480 Paroxysmal atrial fibrillation: Secondary | ICD-10-CM | POA: Diagnosis not present

## 2022-12-13 NOTE — Telephone Encounter (Signed)
Patient fell on Saturday at about 2 am, experienced balance issues resulting in fall  Has pain at about 3 or 4    Patient has bruising but feels better

## 2022-12-14 DIAGNOSIS — R32 Unspecified urinary incontinence: Secondary | ICD-10-CM | POA: Diagnosis not present

## 2022-12-14 DIAGNOSIS — K219 Gastro-esophageal reflux disease without esophagitis: Secondary | ICD-10-CM | POA: Diagnosis not present

## 2022-12-14 DIAGNOSIS — K449 Diaphragmatic hernia without obstruction or gangrene: Secondary | ICD-10-CM | POA: Diagnosis not present

## 2022-12-14 DIAGNOSIS — Z7982 Long term (current) use of aspirin: Secondary | ICD-10-CM | POA: Diagnosis not present

## 2022-12-14 DIAGNOSIS — R1310 Dysphagia, unspecified: Secondary | ICD-10-CM | POA: Diagnosis not present

## 2022-12-14 DIAGNOSIS — Z466 Encounter for fitting and adjustment of urinary device: Secondary | ICD-10-CM | POA: Diagnosis not present

## 2022-12-14 DIAGNOSIS — I480 Paroxysmal atrial fibrillation: Secondary | ICD-10-CM | POA: Diagnosis not present

## 2022-12-14 DIAGNOSIS — M1990 Unspecified osteoarthritis, unspecified site: Secondary | ICD-10-CM | POA: Diagnosis not present

## 2022-12-14 DIAGNOSIS — I714 Abdominal aortic aneurysm, without rupture, unspecified: Secondary | ICD-10-CM | POA: Diagnosis not present

## 2022-12-14 DIAGNOSIS — I701 Atherosclerosis of renal artery: Secondary | ICD-10-CM | POA: Diagnosis not present

## 2022-12-14 DIAGNOSIS — E785 Hyperlipidemia, unspecified: Secondary | ICD-10-CM | POA: Diagnosis not present

## 2022-12-14 DIAGNOSIS — I69152 Hemiplegia and hemiparesis following nontraumatic intracerebral hemorrhage affecting left dominant side: Secondary | ICD-10-CM | POA: Diagnosis not present

## 2022-12-14 DIAGNOSIS — D62 Acute posthemorrhagic anemia: Secondary | ICD-10-CM | POA: Diagnosis not present

## 2022-12-14 DIAGNOSIS — I5032 Chronic diastolic (congestive) heart failure: Secondary | ICD-10-CM | POA: Diagnosis not present

## 2022-12-14 DIAGNOSIS — I110 Hypertensive heart disease with heart failure: Secondary | ICD-10-CM | POA: Diagnosis not present

## 2022-12-14 DIAGNOSIS — N179 Acute kidney failure, unspecified: Secondary | ICD-10-CM | POA: Diagnosis not present

## 2022-12-15 DIAGNOSIS — I69152 Hemiplegia and hemiparesis following nontraumatic intracerebral hemorrhage affecting left dominant side: Secondary | ICD-10-CM | POA: Diagnosis not present

## 2022-12-15 DIAGNOSIS — I701 Atherosclerosis of renal artery: Secondary | ICD-10-CM | POA: Diagnosis not present

## 2022-12-15 DIAGNOSIS — M1990 Unspecified osteoarthritis, unspecified site: Secondary | ICD-10-CM | POA: Diagnosis not present

## 2022-12-15 DIAGNOSIS — K219 Gastro-esophageal reflux disease without esophagitis: Secondary | ICD-10-CM | POA: Diagnosis not present

## 2022-12-15 DIAGNOSIS — K449 Diaphragmatic hernia without obstruction or gangrene: Secondary | ICD-10-CM | POA: Diagnosis not present

## 2022-12-15 DIAGNOSIS — I480 Paroxysmal atrial fibrillation: Secondary | ICD-10-CM | POA: Diagnosis not present

## 2022-12-15 DIAGNOSIS — Z7982 Long term (current) use of aspirin: Secondary | ICD-10-CM | POA: Diagnosis not present

## 2022-12-15 DIAGNOSIS — R32 Unspecified urinary incontinence: Secondary | ICD-10-CM | POA: Diagnosis not present

## 2022-12-15 DIAGNOSIS — D62 Acute posthemorrhagic anemia: Secondary | ICD-10-CM | POA: Diagnosis not present

## 2022-12-15 DIAGNOSIS — Z466 Encounter for fitting and adjustment of urinary device: Secondary | ICD-10-CM | POA: Diagnosis not present

## 2022-12-15 DIAGNOSIS — I110 Hypertensive heart disease with heart failure: Secondary | ICD-10-CM | POA: Diagnosis not present

## 2022-12-15 DIAGNOSIS — R1310 Dysphagia, unspecified: Secondary | ICD-10-CM | POA: Diagnosis not present

## 2022-12-15 DIAGNOSIS — E785 Hyperlipidemia, unspecified: Secondary | ICD-10-CM | POA: Diagnosis not present

## 2022-12-15 DIAGNOSIS — I5032 Chronic diastolic (congestive) heart failure: Secondary | ICD-10-CM | POA: Diagnosis not present

## 2022-12-15 DIAGNOSIS — I714 Abdominal aortic aneurysm, without rupture, unspecified: Secondary | ICD-10-CM | POA: Diagnosis not present

## 2022-12-15 DIAGNOSIS — N179 Acute kidney failure, unspecified: Secondary | ICD-10-CM | POA: Diagnosis not present

## 2022-12-19 DIAGNOSIS — R339 Retention of urine, unspecified: Secondary | ICD-10-CM | POA: Diagnosis not present

## 2022-12-22 DIAGNOSIS — H524 Presbyopia: Secondary | ICD-10-CM | POA: Diagnosis not present

## 2022-12-26 ENCOUNTER — Ambulatory Visit: Payer: Medicare Other | Attending: Internal Medicine | Admitting: Speech Pathology

## 2022-12-26 ENCOUNTER — Encounter: Payer: Self-pay | Admitting: Speech Pathology

## 2022-12-26 ENCOUNTER — Other Ambulatory Visit: Payer: Self-pay

## 2022-12-26 DIAGNOSIS — I69352 Hemiplegia and hemiparesis following cerebral infarction affecting left dominant side: Secondary | ICD-10-CM | POA: Diagnosis not present

## 2022-12-26 DIAGNOSIS — R4701 Aphasia: Secondary | ICD-10-CM | POA: Insufficient documentation

## 2022-12-26 DIAGNOSIS — R2681 Unsteadiness on feet: Secondary | ICD-10-CM | POA: Insufficient documentation

## 2022-12-26 DIAGNOSIS — R41841 Cognitive communication deficit: Secondary | ICD-10-CM | POA: Insufficient documentation

## 2022-12-26 DIAGNOSIS — R279 Unspecified lack of coordination: Secondary | ICD-10-CM | POA: Diagnosis not present

## 2022-12-26 DIAGNOSIS — R2689 Other abnormalities of gait and mobility: Secondary | ICD-10-CM | POA: Insufficient documentation

## 2022-12-26 DIAGNOSIS — M6281 Muscle weakness (generalized): Secondary | ICD-10-CM | POA: Diagnosis not present

## 2022-12-26 DIAGNOSIS — R278 Other lack of coordination: Secondary | ICD-10-CM | POA: Diagnosis not present

## 2022-12-26 NOTE — Therapy (Signed)
OUTPATIENT SPEECH LANGUAGE PATHOLOGY APHASIA EVALUATION   Patient Name: James Estrada MRN: [id number] DOB:[birth year], 82 y.o., male Today's Date: 12/26/2022  PCP: [name], MD REFERRING PROVIDER: [name], MD  END OF SESSION:  End of Session - 12/26/22 1502     Visit Number 1    Number of Visits 25    Date for SLP Re-Evaluation 03/20/23    SLP Start Time 24    SLP Stop Time  [id number]    SLP Time Calculation (min) 45 min    Activity Tolerance Patient tolerated treatment well             Past Medical History:  Diagnosis Date   AAA (abdominal aortic aneurysm) ([place])    last u/s done 07/18/17    Aneurysm of left internal iliac artery ([place]) 05/22/2022   Arthritis    Bradycardia    CVA (cerebral vascular accident) ([place]) 10/04/2022   Dysrhythmia    frequent PAC, for 20 years   GERD (gastroesophageal reflux disease)    occ, OTC   Hemorrhoids    History of hiatal hernia    Hyperlipidemia    Hypertension    S/P aortic dissection repair 09/18/2022   Seasonal allergies    Type 1 dissection of thoracic aorta ([place]) 10/26/2022   Past Surgical History:  Procedure Laterality Date   ABDOMINAL AORTIC ENDOVASCULAR STENT GRAFT N/A 05/04/2020   Procedure: ABDOMINAL AORTIC ENDOVASCULAR STENT GRAFT;  Surgeon: [name], MD;  Location: [facility];  Service: Vascular;  Laterality: N/A;   CATARACT EXTRACTION Bilateral 2009   DIAGNOSTIC LAPAROSCOPY     laparoscopic hernia repair   EYE SURGERY Bilateral    cataract removal   HERNIA REPAIR Bilateral 1999, 2006   JOINT REPLACEMENT Right ~2018   hip replacement   pheochromocytoma  1993   PROSTATECTOMY N/A 05/15/2013   Procedure: PROSTATECTOMY RETROPUBIC; SIMPLE OPEN PROSTATECTOMY;  Surgeon: [name], MD;  Location: [facility];  Service: Urology;  Laterality: N/A;   REPAIR OF ACUTE ASCENDING THORACIC AORTIC DISSECTION N/A 09/17/2022   Procedure: REPAIR OF ACUTE ASCENDING THORACIC AORTIC DISSECTION USING 28 MM HEMASHIELD PLATINUM  VASCULAR GRAFT;  Surgeon: [name], MD;  Location: [facility];  Service: Vascular;  Laterality: N/A;  Median sternotomy   TOTAL ELBOW REPLACEMENT Left    > 30 years ago   TOTAL HIP ARTHROPLASTY Right 09/08/2017   Procedure: RIGHT TOTAL HIP ARTHROPLASTY ANTERIOR APPROACH;  Surgeon: [name], MD;  Location: [facility];  Service: Orthopedics;  Laterality: Right;   ULTRASOUND GUIDANCE FOR VASCULAR ACCESS Bilateral 05/04/2020   Procedure: ULTRASOUND GUIDANCE FOR VASCULAR ACCESS;  Surgeon: [name], MD;  Location: [facility];  Service: Vascular;  Laterality: Bilateral;   Patient Active Problem List   Diagnosis Date Noted   Type 1 dissection of thoracic aorta ([place]) 10/26/2022   CVA (cerebral vascular accident) ([place]) 10/04/2022   Sepsis with acute renal failure without septic shock ([place]) 09/30/2022   S/P aortic dissection repair 09/18/2022   Status post surgery 09/17/2022   Aneurysm of left internal iliac artery ([place]) 05/22/2022   Colon cancer screening 05/22/2022   Essential hypertension 05/22/2022   Hemorrhoids 05/22/2022   Inguinal hernia 05/22/2022   Irritable bowel syndrome 05/22/2022   Mixed hyperlipidemia 05/22/2022   Pure hypercholesterolemia 05/22/2022   Thrombocytopenia ([place]) 05/22/2022   Allergic rhinitis 05/22/2022   AAA (abdominal aortic aneurysm) without rupture ([place]) 05/04/2020   Trochanteric bursitis, right hip 08/20/2018   History of right hip replacement  08/20/2018   Status post total replacement of right hip 09/08/2017   Pain of right hip joint 08/07/2017   Unilateral primary osteoarthritis, right hip 08/07/2017   BPH (benign prostatic hypertrophy) with urinary obstruction 05/15/2013    ONSET DATE: 09/18/23   REFERRING DIAG: R13.10 (ICD-10-CM) - Dysphagia, unspecified  THERAPY DIAG:  Aphasia  Cognitive communication deficit  Rationale for Evaluation and Treatment: Rehabilitation  SUBJECTIVE:   SUBJECTIVE STATEMENT: "It's my memory and  names" Pt accompanied by: significant other James Estrada  PERTINENT HISTORY: Pt is a 82 y/o M admitted to [facility] on [facility]22 with type 1 aortic dissection requiring emergent repair. Head CT 11/6 acute infarcts within R MCA involvement, including R cerebellum, basal ganglia, frontal lobe. ETT 11/4-11/9. PMH AAA s/p endovascular repair (2020), HTN, HLD, GERD. Hosoitalized 09/18/23 to 10/21/23. Has had [place] [facility], PT, OT.  PAIN:  Are you having pain? No  FALLS: Has patient fallen in last 6 months?  Yes, Number of falls: 2, Comment: Is not interested in PT, feeling stable, will continue to monitor  LIVING ENVIRONMENT: Lives with: lives with their family Lives in: House/apartment, plans to move into Independent Living at [facility] - Closed in the spring  PLOF:  Level of assistance: Independent with ADLs, Independent with IADLs Employment: Retired  PATIENT GOALS: "To speak and talk better, to find my words, to be perfect"  OBJECTIVE:     COGNITION: Overall cognitive status: Impaired Areas of impairment:  Memory: Impaired: Short term Production designer, theatre/television/film Functional deficits: forgets how to access phone, tablet, remote despite being shown repeatedly  AUDITORY COMPREHENSION: Overall auditory comprehension: Impaired: complex YES/NO questions: Impaired: complex Following directions: Appears intact Conversation: Moderately Complex Interfering components: processing speed Effective technique: extra processing time, pausing, repetition/stressing words, and slowed speech  READING COMPREHENSION: Impaired: phrase  EXPRESSION: verbal  VERBAL EXPRESSION: Level of generative/spontaneous verbalization: conversation Automatic speech: name: intact, social response: intact, and day of week: intact  Repetition: Appears intact Naming: Confrontation: 76-100% and Divergent: 0-25% Pragmatics: Appears intact Comments: in conversation, word finding difficulty evident, but pt can discuss all topics Interfering  components:  none Effective technique: semantic cues, sentence completion, and phonemic cues Non-verbal means of communication: N/A  WRITTEN EXPRESSION: Dominant hand: left Written expression: Impaired: word  MOTOR SPEECH: Overall motor speech: Appears intact  ORAL MOTOR EXAMINATION: Overall status: WFL Comments:   STANDARDIZED ASSESSMENTS: QAB: Mild  PATIENT REPORTED OUTCOME MEASURES (PROM): Communication Participation Item Bank: 14/30 - Pt's spouse completed PROM due to difficulty with reading comprehension. She rated "very much" difficulty getting in a turn in a fast conversation, and "quite a bit" of difficulty communicating in the community, asking questions, having a long conversation, giving detailed information and "a little" difficulty on remaining questions.    TODAY'S TREATMENT:  DATE:  12/26/22 (eval day): initiated education re: word finding activities to do at home, environmental compensations to maximize communication, family behaviors to support communication   PATIENT EDUCATION: Education details: See patient instructions, see today's treatment, compensations for aphasia Person educated: Patient and Spouse Education method: Explanation, Verbal cues, and Handouts Education comprehension: verbal cues required and needs further education   GOALS: Goals reviewed with patient? Yes  SHORT TERM GOALS: Target date: 01/23/23  Pt will name 10 items in personally relevant categories over 3 sessions with occasional min A Baseline: Goal status: INITIAL  2.  Pt will write 4 item grocery list with occasional min A Baseline:  Goal status: INITIAL  3.  Pt will respond to 3 texts with occasional min A over 1 week Baseline:  Goal status: INITIAL  4.  Pt will generate complex verbal sentences 3x using Pharmacist, hospital  with rare min A over 2 sessions Baseline:  Goal status: INITIAL  5.  Pt will comprehend 4 sentence passage/email with extended time and rare min A Baseline:  Goal status: INITIAL  6.  Pt will employ compensations for aphasia in structured language tasks with occasional min A Baseline:  Goal status: INITIAL  LONG TERM GOALS: Target date: 03/20/23  Pt will complete complex naming tasks with 80% accuracy and occasional min A Baseline:  Goal status: INITIAL  2.  Pt will carryover verbal compensations for aphasia with occasional min A in conversation as needed over 2 sessions Baseline:  Goal status: INITIAL  3.  Pt will carryover 3 strategies to successfully communicate in community with clerks, servers etc Baseline:  Goal status: INITIAL  4.  Pt will write 3 sentence text or email with occasional min A, external aids and occasional min A  Baseline:  Goal status: INITIAL  5.  Pt will improve score on Communicative Participation Item Bank by 4 points Baseline: 14/30 Goal status: INITIAL    ASSESSMENT:  CLINICAL IMPRESSION: Patient is a 82 y.o. male who was seen today for aphasia s/p CVA. He is accompanied by his spouse James Estrada. Prior to CVA, Curry was active golfing, managing finances,  keeping up with the stock market and socializing with friends. At this time, he has difficulty using his phone and remote and is not texting due to impaired written expression. James Estrada is managing his medications and appointments. He has difficulty reading numbers, letters and words. James Estrada reports he is "quick to give up" when he encounters a communication breakdown. They plan to move into independent living at [facility] in the spring. I recommend skilled ST to maximize verbal and written communication and cognition for safety, to reduce caregiver burden and QOL.   OBJECTIVE IMPAIRMENTS: include memory and aphasia. These impairments are limiting patient from managing medications, managing  appointments, managing finances, household responsibilities, ADLs/IADLs, and effectively communicating at home and in community. Factors affecting potential to achieve goals and functional outcome are ability to learn/carryover information. Patient will benefit from skilled SLP services to address above impairments and improve overall function.  REHAB POTENTIAL: Good  PLAN:  SLP FREQUENCY: 2x/week  SLP DURATION: 12 weeks  PLANNED INTERVENTIONS: Language facilitation, Environmental controls, Cueing hierachy, Cognitive reorganization, Internal/external aids, Functional tasks, and Multimodal communication approach    Kelby Adell, Annye Rusk, CCC-SLP 12/26/2022, 3:53 PM

## 2022-12-26 NOTE — Patient Instructions (Addendum)
   Tips for Talking with People who have Aphasia  Say one thing at a time Don't  rush - slow down, be patient Talk face to face Reduce background noise Relax - be natural Use pen and paper Write down key words Draw diagrams or pictures Don't pretend you understand Ask what helps Recap - check you both understand Be a partner, not a therapist   Aphasia does not affect intelligence, only language. The person with aphasia can still: make decisions, have opinions, and socialize.   Describing words  What group does it belong to?  What do I use it for?  Where can I find it?  What does it LOOK like?  What other words go with it?  What is the 1st sound of the word?   Many Ways to Communicate  Describe it Write it Draw it Gesture it Use related words  [place] with Dr. Leo Rod at [facility] - email [email or web address]  TalkPath Therapy app by [place] on [facility] website National Aphasia Association - naa.aphasia.org Aphasia Recovery Connection - aphasiarecoveryconnection.org Tactus therapy apps Constant Therapy  Podcasts and IG accounts:  [employer] Podcast Recovery After Stroke Podcast Stroke Recovery Association Stroke Onward  Watch: Patience Listening and Communicating with Aphasia Patients on YouTube MarathonDancing.gl   Tips to help facilitate better attention, concentration, focus   Do harder, longer tasks when you are most alert/awake  Break down larger tasks into small parts  Limit distractions of TV, radio, conversation, e mails/texts, appliance noise, etc - if a job is important, do it in a quiet room  Be aware of how you are functioning in high stimulation environments such as large stores, parties, restaurants - any place with lots of lights, noise, signs etc  Group conversations may be more difficult to process than one on one conversations  Give yourself  extra time to process conversation, reading materials, directions or information from your healthcare providers  Organization is key - clutters of laundry, mail, paperwork, dirty dishes - all make it more difficult to concentrate  Before you start a task, have all the needed supplies, directions, recipes ready and organized. This way you don't have to go looking for something in the middle of a task and become distracted.   Be aware of fatigue - take rests or breaks when needed to re-group and re-focus    Provided by: Barnabas Lister [place], [phone]

## 2022-12-27 DIAGNOSIS — R4701 Aphasia: Secondary | ICD-10-CM | POA: Diagnosis not present

## 2022-12-27 DIAGNOSIS — R278 Other lack of coordination: Secondary | ICD-10-CM | POA: Diagnosis not present

## 2022-12-27 DIAGNOSIS — D696 Thrombocytopenia, unspecified: Secondary | ICD-10-CM | POA: Diagnosis not present

## 2022-12-27 DIAGNOSIS — R2681 Unsteadiness on feet: Secondary | ICD-10-CM | POA: Diagnosis not present

## 2022-12-27 DIAGNOSIS — R41841 Cognitive communication deficit: Secondary | ICD-10-CM | POA: Diagnosis not present

## 2022-12-27 DIAGNOSIS — M6281 Muscle weakness (generalized): Secondary | ICD-10-CM | POA: Diagnosis not present

## 2022-12-27 DIAGNOSIS — R339 Retention of urine, unspecified: Secondary | ICD-10-CM | POA: Diagnosis not present

## 2022-12-27 DIAGNOSIS — I480 Paroxysmal atrial fibrillation: Secondary | ICD-10-CM | POA: Diagnosis not present

## 2022-12-27 DIAGNOSIS — I69354 Hemiplegia and hemiparesis following cerebral infarction affecting left non-dominant side: Secondary | ICD-10-CM | POA: Diagnosis not present

## 2022-12-27 DIAGNOSIS — R279 Unspecified lack of coordination: Secondary | ICD-10-CM | POA: Diagnosis not present

## 2022-12-28 ENCOUNTER — Encounter (HOSPITAL_COMMUNITY): Payer: Self-pay | Admitting: Internal Medicine

## 2022-12-28 ENCOUNTER — Ambulatory Visit (HOSPITAL_COMMUNITY)
Admission: RE | Admit: 2022-12-28 | Discharge: 2022-12-28 | Disposition: A | Discharge status: Home / Self Care | Payer: Medicare Other | Source: Ambulatory Visit | Attending: Internal Medicine | Admitting: Internal Medicine

## 2022-12-28 ENCOUNTER — Encounter: Payer: Self-pay | Admitting: Speech Pathology

## 2022-12-28 ENCOUNTER — Ambulatory Visit: Payer: Medicare Other | Admitting: Speech Pathology

## 2022-12-28 VITALS — BP 102/60 | HR 67 | Wt 177.0 lb

## 2022-12-28 DIAGNOSIS — I509 Heart failure, unspecified: Secondary | ICD-10-CM | POA: Diagnosis not present

## 2022-12-28 DIAGNOSIS — R4701 Aphasia: Secondary | ICD-10-CM | POA: Diagnosis not present

## 2022-12-28 DIAGNOSIS — I6319 Cerebral infarction due to embolism of other precerebral artery: Secondary | ICD-10-CM

## 2022-12-28 DIAGNOSIS — I480 Paroxysmal atrial fibrillation: Secondary | ICD-10-CM | POA: Diagnosis not present

## 2022-12-28 DIAGNOSIS — R2681 Unsteadiness on feet: Secondary | ICD-10-CM | POA: Diagnosis not present

## 2022-12-28 DIAGNOSIS — I69352 Hemiplegia and hemiparesis following cerebral infarction affecting left dominant side: Secondary | ICD-10-CM | POA: Diagnosis not present

## 2022-12-28 DIAGNOSIS — I10 Essential (primary) hypertension: Secondary | ICD-10-CM | POA: Diagnosis not present

## 2022-12-28 DIAGNOSIS — R41841 Cognitive communication deficit: Secondary | ICD-10-CM

## 2022-12-28 DIAGNOSIS — Z9889 Other specified postprocedural states: Secondary | ICD-10-CM | POA: Diagnosis not present

## 2022-12-28 DIAGNOSIS — R339 Retention of urine, unspecified: Secondary | ICD-10-CM | POA: Diagnosis not present

## 2022-12-28 DIAGNOSIS — R2689 Other abnormalities of gait and mobility: Secondary | ICD-10-CM | POA: Diagnosis not present

## 2022-12-28 LAB — CBC
HCT: 38.9 % — ABNORMAL LOW (ref 39.0–52.0)
Hemoglobin: 11.6 g/dL — ABNORMAL LOW (ref 13.0–17.0)
MCH: 25.4 pg — ABNORMAL LOW (ref 26.0–34.0)
MCHC: 29.8 g/dL — ABNORMAL LOW (ref 30.0–36.0)
MCV: 85.1 fL (ref 80.0–100.0)
Platelets: 185 10*3/uL (ref 150–400)
RBC: 4.57 MIL/uL (ref 4.22–5.81)
RDW: 15.8 % — ABNORMAL HIGH (ref 11.5–15.5)
WBC: 9.7 10*3/uL (ref 4.0–10.5)
nRBC: 0 % (ref 0.0–0.2)

## 2022-12-28 LAB — BASIC METABOLIC PANEL
Anion gap: 9 (ref 5–15)
BUN: 24 mg/dL — ABNORMAL HIGH (ref 8–23)
CO2: 25 mmol/L (ref 22–32)
Calcium: 9.2 mg/dL (ref 8.9–10.3)
Chloride: 105 mmol/L (ref 98–111)
Creatinine, Ser: 1.29 mg/dL — ABNORMAL HIGH (ref 0.61–1.24)
GFR, Estimated: 56 mL/min — ABNORMAL LOW (ref >60)
Glucose, Bld: 118 mg/dL — ABNORMAL HIGH (ref 70–99)
Potassium: 4.4 mmol/L (ref 3.5–5.1)
Sodium: 139 mmol/L (ref 135–145)

## 2022-12-28 LAB — BRAIN NATRIURETIC PEPTIDE: B Natriuretic Peptide: 85.5 pg/mL (ref 0.0–100.0)

## 2022-12-28 NOTE — Patient Instructions (Signed)
STOP Amiodarone  Labs done today, your results will be available in MyChart, we will contact you for abnormal readings.  Your physician has requested that you have an echocardiogram. Echocardiography is a painless test that uses sound waves to create images of your heart. It provides your doctor with information about the size and shape of your heart and how well your heart's chambers and valves are working. This procedure takes approximately one hour. There are no restrictions for this procedure. Please do NOT wear cologne, perfume, aftershave, or lotions (deodorant is allowed). Please arrive 15 minutes prior to your appointment time.  You have been referred to outpatient Physical Therapy and Occupational Therapy. You will be called to have these appointments arranged.  Your physician recommends that you schedule a follow-up appointment in: 6 months ( August 2024)  ** please call the office in May to arrange your follow up appointment. **  If you have any questions or concerns before your next appointment please send [place] a message through [place] or call our office at [phone].    TO LEAVE A MESSAGE FOR THE NURSE SELECT OPTION 2, PLEASE LEAVE A MESSAGE INCLUDING: YOUR NAME DATE OF BIRTH CALL BACK NUMBER REASON FOR CALL**this is important as we prioritize the call backs  YOU WILL RECEIVE A CALL BACK THE SAME DAY AS LONG AS YOU CALL BEFORE 4:00 PM  At the [facility], you and your health needs are our priority. As part of our continuing mission to provide you with exceptional heart care, we have created designated Provider Care Teams. These Care Teams include your primary Cardiologist (physician) and Advanced Practice Providers (APPs- Physician Assistants and Nurse Practitioners) who all work together to provide you with the care you need, when you need it.   You may see any of the following providers on your designated Care Team at your next follow up: Dr Glori Bickers Dr Loralie Champagne Dr. Roxana Hires, NP Lyda Jester, [place] [facility] [place], [place] Forestine Na, NP Audry Riles, PharmD   Please be sure to bring in all your medications bottles to every appointment.    Thank you for choosing [facility]

## 2022-12-28 NOTE — Progress Notes (Signed)
ADVANCED HF CLINIC NOTE  PCP: Primary Cardiologist:  HPI:  82 y/o male w/ h/o HTN, HLD, AAA s/p stent graft, he presented on 11/4 w/ sudden onset chest/back pain, b/l leg weakness and collapse. CODE Stroke initially called. Head CT negative. Chest CT showed Type 1 Aortic dissection involving innominate artery. Also evidence of a retroperitoneal bleed. Taken on OR for emergent repair. Intraoperative TEE showed LVEF 60-65%, normal RV and moderate sized pericardial effusion w/o evidence of tamponade. Mediastinal tubes placed. Had massive blood loss requiring multiple units of PRBC's, Plts, FFP, and Cryoprecipitate in OR.    Post-op course c/b AKI, stroke, and PAF.   Here with his wife. Progressing steadily.  Finished HHPT/OT. Now moving to outpatient PT/OT  Moving to [place] in April   Denies CP or SOB. Still with some expressive aphasia Still with urinary retention and using in/out catheters   ROS: All systems negative except as listed in HPI, PMH and Problem List.  SH:  Social History   Socioeconomic History   Marital status: Married    Spouse name: Not on file   Number of children: Not on file   Years of education: Not on file   Highest education level: Not on file  Occupational History   Not on file  Tobacco Use   Smoking status: Never   Smokeless tobacco: Never  Vaping Use   Vaping Use: Never used  Substance and Sexual Activity   Alcohol use: Yes    Comment: 2 or 3 drinks on weekends   Drug use: No   Sexual activity: Not on file  Other Topics Concern   Not on file  Social History Narrative   Not on file   Social Determinants of Health   Financial Resource Strain: Not on file  Food Insecurity: No Food Insecurity (09/22/2022)   Hunger Vital Sign    Worried About Running Out of Food in the Last Year: Never true    Ran Out of Food in the Last Year: Never true  Transportation Needs: No Transportation Needs (09/22/2022)   PRAPARE - Radiographer, therapeutic (Medical): No    Lack of Transportation (Non-Medical): No  Physical Activity: Not on file  Stress: Not on file  Social Connections: Not on file  Intimate Partner Violence: Not At Risk (09/22/2022)   Humiliation, Afraid, Rape, and Kick questionnaire    Fear of Current or Ex-Partner: No    Emotionally Abused: No    Physically Abused: No    Sexually Abused: No    FH:  Family History  Problem Relation Age of Onset   Heart disease Mother    Coronary artery disease Mother    Aneurysm Father    Diabetes Sister     Past Medical History:  Diagnosis Date   AAA (abdominal aortic aneurysm) ([place])    last u/s done 07/18/17    Aneurysm of left internal iliac artery ([place]) 05/22/2022   Arthritis    Bradycardia    CVA (cerebral vascular accident) ([place]) 10/04/2022   Dysrhythmia    frequent PAC, for 20 years   GERD (gastroesophageal reflux disease)    occ, OTC   Hemorrhoids    History of hiatal hernia    Hyperlipidemia    Hypertension    S/P aortic dissection repair 09/18/2022   Seasonal allergies    Type 1 dissection of thoracic aorta ([place]) 10/26/2022    Current Outpatient Medications  Medication Sig Dispense Refill   acetaminophen (TYLENOL)  325 MG tablet Take 1-2 tablets (325-650 mg total) by mouth every 4 (four) hours as needed for mild pain.     amiodarone (PACERONE) 200 MG tablet Take 1 tablet (200 mg total) by mouth daily. 30 tablet 0   aspirin 81 MG chewable tablet Chew 1 tablet (81 mg total) by mouth daily. 30 tablet 0   atorvastatin (LIPITOR) 40 MG tablet Take 1 tablet (40 mg total) by mouth daily. 30 tablet 0   citalopram (CELEXA) 10 MG tablet Take 1 tablet (10 mg total) by mouth daily. 30 tablet 0   doxazosin (CARDURA) 1 MG tablet Take 1 tablet (1 mg total) by mouth at bedtime. 30 tablet 0   metoprolol tartrate (LOPRESSOR) 25 MG tablet Take 0.5 tablets (12.5 mg total) by mouth 2 (two) times daily. 15 tablet 0   senna-docusate (SENOKOT-S) 8.6-50 MG tablet Take 1  tablet by mouth at bedtime as needed for mild constipation. 30 tablet 0   tamsulosin (FLOMAX) 0.4 MG CAPS capsule Take 0.4 mg by mouth at bedtime.     No current facility-administered medications for this encounter.    Vitals:   12/28/22 1348  BP: 102/60  Pulse: 67  SpO2: 97%  Weight: 80.3 kg (177 lb)    PHYSICAL EXAM:  General:  Well appearing. No resp difficulty HEENT: normal Neck: supple. JVP flat. Carotids 2+ bilaterally; no bruits. No lymphadenopathy or thryomegaly appreciated. Cor: PMI normal. Regular rate & rhythm. No rubs, gallops or murmurs. Lungs: clear Abdomen: soft, nontender, nondistended. No hepatosplenomegaly. No bruits or masses. Good bowel sounds. Extremities: no cyanosis, clubbing, rash, edema Neuro: alert & orientedx3, cranial nerves grossly intact. Moves all 4 extremities w/o difficulty. Affect pleasant. Mild aphasia   ASSESSMENT & PLAN:   1. Type 1 Aortic Dissection  - s/p emergent repair 11/4 - Intra-op TEE EF 60-65%, RV ok - f/u 11/23 EF 50-55% trivial effusion  - Stable s/p repair   2. Hypertension  - Blood pressure well controlled. Continue current regimen.   3. Post-Op PAF - no recurrent AF - can stop amio    4. Post-op CVA  - CT head 11/6 showed 3 small right-sided strokes 1 in the cerebellum 1 in the right basal ganglia and 1 in the frontal lobe with a 4 mm area of bleeding  - CTA - R carotid involved with dissection - following with PT/OT - continues to improve.  Glori Bickers, MD  9:44 AM

## 2022-12-28 NOTE — Therapy (Signed)
OUTPATIENT SPEECH LANGUAGE PATHOLOGY TREATMENT NOTE   Patient Name: James Estrada MRN: [id number] DOB:04/07/1941, 82 y.o., male Today's Date: 12/28/2022  PCP: [name], MD REFERRING PROVIDER: [name], MD  END OF SESSION:   End of Session - 12/28/22 1451     Visit Number 2    Number of Visits 25    Date for SLP Re-Evaluation 03/20/23    SLP Start Time 38    SLP Stop Time  [id number]    SLP Time Calculation (min) 45 min    Activity Tolerance Patient tolerated treatment well             Past Medical History:  Diagnosis Date   AAA (abdominal aortic aneurysm) ([place])    last u/s done 07/18/17    Aneurysm of left internal iliac artery ([place]) 05/22/2022   Arthritis    Bradycardia    CVA (cerebral vascular accident) ([place]) 10/04/2022   Dysrhythmia    frequent PAC, for 20 years   GERD (gastroesophageal reflux disease)    occ, OTC   Hemorrhoids    History of hiatal hernia    Hyperlipidemia    Hypertension    S/P aortic dissection repair 09/18/2022   Seasonal allergies    Type 1 dissection of thoracic aorta ([place]) 10/26/2022   Past Surgical History:  Procedure Laterality Date   ABDOMINAL AORTIC ENDOVASCULAR STENT GRAFT N/A 05/04/2020   Procedure: ABDOMINAL AORTIC ENDOVASCULAR STENT GRAFT;  Surgeon: [name], MD;  Location: [facility];  Service: Vascular;  Laterality: N/A;   CATARACT EXTRACTION Bilateral 2009   DIAGNOSTIC LAPAROSCOPY     laparoscopic hernia repair   EYE SURGERY Bilateral    cataract removal   HERNIA REPAIR Bilateral 1999, 2006   JOINT REPLACEMENT Right ~2018   hip replacement   pheochromocytoma  1993   PROSTATECTOMY N/A 05/15/2013   Procedure: PROSTATECTOMY RETROPUBIC; SIMPLE OPEN PROSTATECTOMY;  Surgeon: [name], MD;  Location[facility];  Service: Urology;  Laterality: N/A;   REPAIR OF ACUTE ASCENDING THORACIC AORTIC DISSECTION N/A 09/17/2022   Procedure: REPAIR OF ACUTE ASCENDING THORACIC AORTIC DISSECTION USING 28 MM HEMASHIELD PLATINUM  VASCULAR GRAFT;  Surgeon: [name], MD;  Location: [facility];  Service: Vascular;  Laterality: N/A;  Median sternotomy   TOTAL ELBOW REPLACEMENT Left    > 30 years ago   TOTAL HIP ARTHROPLASTY Right 09/08/2017   Procedure: RIGHT TOTAL HIP ARTHROPLASTY ANTERIOR APPROACH;  Surgeon: [name], MD;  [facility];  Service: Orthopedics;  Laterality: Right;   ULTRASOUND GUIDANCE FOR VASCULAR ACCESS Bilateral 05/04/2020   Procedure: ULTRASOUND GUIDANCE FOR VASCULAR ACCESS;  Surgeon: [name], MD;  Location: [facility];  Service: Vascular;  Laterality: Bilateral;   Patient Active Problem List   Diagnosis Date Noted   Type 1 dissection of thoracic aorta ([place]) 10/26/2022   CVA (cerebral vascular accident) ([place]) 10/04/2022   Sepsis with acute renal failure without septic shock ([place]) 09/30/2022   S/P aortic dissection repair 09/18/2022   Status post surgery 09/17/2022   Aneurysm of left internal iliac artery ([place]) 05/22/2022   Colon cancer screening 05/22/2022   Essential hypertension 05/22/2022   Hemorrhoids 05/22/2022   Inguinal hernia 05/22/2022   Irritable bowel syndrome 05/22/2022   Mixed hyperlipidemia 05/22/2022   Pure hypercholesterolemia 05/22/2022   Thrombocytopenia ([place]) 05/22/2022   Allergic rhinitis 05/22/2022   AAA (abdominal aortic aneurysm) without rupture ([place]) 05/04/2020   Trochanteric bursitis, right hip 08/20/2018   History of right hip  replacement 08/20/2018   Status post total replacement of right hip 09/08/2017   Pain of right hip joint 08/07/2017   Unilateral primary osteoarthritis, right hip 08/07/2017   BPH (benign prostatic hypertrophy) with urinary obstruction 05/15/2013    ONSET DATE: 09/17/22  REFERRING DIAG: R13.10 (ICD-10-CM) - Dysphagia, unspecified  THERAPY DIAG:  Aphasia  Cognitive communication deficit  Rationale for Evaluation and Treatment: Rehabilitation  SUBJECTIVE:   SUBJECTIVE STATEMENT:  PAIN:  Are you having  pain? No    OBJECTIVE:  Pt is a 82 y/o M admitted [facility] on 09/17/22 with type 1 aortic dissection requiring emergent repair. Head CT 11/6 acute infarcts within R MCA involvement, including R cerebellum, basal ganglia, frontal lobe. ETT 11/4-11/9. PMH AAA s/p endovascular repair (2020), HTN, HLD, GERD.   TODAY'S TREATMENT:                                                                                                                                         DATE:    12/28/22:  Aphasia ID card provided.  To target word finding, sentence generation, reading comrehension  Pharmacist, hospital ([facility]) was utilized. The pt generated 3 subjects and objects for 3 verbs (drive, measure, kick), for a total of 9 subject objects. Pt required extended time and usual min to mod semantic and phonemic  cues. Pt generated 3 complex sentences by answering "wh" questions. Pt required usual min semantic and written  cues to generate complex sentences and comprehend "wh" questions. Divergent naming targeted in personally relevant category, golf, [place] generated 15 words with extended time, usual min semantic cues. With questioning cues and instruction to "tell me about this" James Estrada successfully used verbal compensations to communicate a favorite restaurant. Targeted reading comprehension and phone access - James Estrada required extended time, visual cues to locate and select text message icon. He read 3 sentence text with questioning cues for accurate comprehension of text.   12/26/22 (eval day): initiated education re: word finding activities to do at home, environmental compensations to maximize communication, family behaviors to support communication     PATIENT EDUCATION: Education details: See patient instructions, see today's treatment, compensations for aphasia Person educated: Patient and Spouse Education method: Explanation, Verbal cues, and Handouts Education comprehension: verbal cues required and  needs further education     GOALS: Goals reviewed with patient? Yes   SHORT TERM GOALS: Target date: 01/23/23   Pt will name 10 items in personally relevant categories over 3 sessions with occasional min A Baseline: Goal status: ONGOING   2.  Pt will write 4 item grocery list with occasional min A Baseline:  Goal status: ONGOING   3.  Pt will respond to 3 texts with occasional min A over 1 week Baseline:  Goal status: ONGOING   4.  Pt will generate complex verbal sentences 3x using Pharmacist, hospital with rare min A over  2 sessions Baseline:  Goal status: ONGOING   5.  Pt will comprehend 4 sentence passage/email with extended time and rare min A Baseline:  Goal status: ONGOING   6.  Pt will employ compensations for aphasia in structured language tasks with occasional min A Baseline:  Goal status: ONGOING   LONG TERM GOALS: Target date: 03/20/23   Pt will complete complex naming tasks with 80% accuracy and occasional min A Baseline:  Goal status: ONGOING   2.  Pt will carryover verbal compensations for aphasia with occasional min A in conversation as needed over 2 sessions Baseline:  Goal status: ONGOING   3.  Pt will carryover 3 strategies to successfully communicate in community with clerks, servers etc Baseline:  Goal status: ONGOING   4.  Pt will write 3 sentence text or email with occasional min A, external aids and occasional min A  Baseline:  Goal status: ONGOING   5.  Pt will improve score on Communicative Participation Item Bank by 4 points Baseline: 14/30 Goal status: ONGOING       ASSESSMENT:   CLINICAL IMPRESSION: Patient is a 82 y.o. male who was seen today for aphasia s/p CVA. He is accompanied by his spouse James Estrada. Prior to CVA, Jayjuan was active golfing, managing finances,  keeping up with the stock market and socializing with friends. At this time, he has difficulty using his phone and remote and is not texting due to impaired  written expression. James Estrada is managing his medications and appointments. He has difficulty reading numbers, letters and words. James Estrada reports he is "quick to give up" when he encounters a communication breakdown. They plan to move into independent living at [facility] in the spring. I recommend skilled ST to maximize verbal and written communication and cognition for safety, to reduce caregiver burden and QOL.    OBJECTIVE IMPAIRMENTS: include memory and aphasia. These impairments are limiting patient from managing medications, managing appointments, managing finances, household responsibilities, ADLs/IADLs, and effectively communicating at home and in community. Factors affecting potential to achieve goals and functional outcome are ability to learn/carryover information. Patient will benefit from skilled SLP services to address above impairments and improve overall function.   REHAB POTENTIAL: Good   PLAN:   SLP FREQUENCY: 2x/week   SLP DURATION: 12 weeks   PLANNED INTERVENTIONS: Language facilitation, Environmental controls, Cueing hierachy, Cognitive reorganization, Internal/external aids, Functional tasks, and Multimodal communication approach         Cherre Kothari, Annye Rusk, CCC-SLP 12/28/2022, 3:04 PM

## 2022-12-29 ENCOUNTER — Encounter: Payer: Medicare Other | Attending: Registered Nurse | Admitting: Physical Medicine & Rehabilitation

## 2022-12-29 ENCOUNTER — Encounter: Payer: Self-pay | Admitting: Physical Medicine & Rehabilitation

## 2022-12-29 VITALS — BP 137/85 | HR 63 | Ht 72.0 in | Wt 175.0 lb

## 2022-12-29 DIAGNOSIS — I69320 Aphasia following cerebral infarction: Secondary | ICD-10-CM | POA: Insufficient documentation

## 2022-12-29 DIAGNOSIS — I69390 Apraxia following cerebral infarction: Secondary | ICD-10-CM | POA: Insufficient documentation

## 2022-12-29 NOTE — Progress Notes (Signed)
Subjective:  82 y.o. male, left- handed, who presented from home to the emergency department via EMS on 09/17/2022 with acute onset of bilateral leg weakness and left upper back pain.  The patient's medical history is significant for multiple joint replacements and abdominal aortic aneurysm status post stent graft placement July, 2021.  Neurology was consulted.  Stat CT examination of the head revealed no acute abnormality.  CT dissection protocol revealed acute aortic dissection with retroperitoneal blood adjacent to the left psoas muscle.  Emergent aortic dissection repair with effort to spare the artery supplying the spinal cord emergently necessary and the patient was admitted by Dr. Roxan Hockey.  The patient was counseled regarding emergent need of repair of type I dissection.  He underwent placement of 30 mm Hemashield graft by Dr. Roxan Hockey on 11/5 and was transferred to the ICU.  Rapid wean protocol initiated.  Hypertension and hypokalemia addressed.  Elevated serum creatinine consistent with acute kidney injury.  No Lovenox given secondary to thrombocytopenia.  SCDs for DVT prophylaxis.  Advanced heart failure team consulted 11/6.  Remained on Cardene infusion for blood pressure control.  Aspirin and beta-blocker ordered.  Lasix infusion for diuresis.  Postoperative paroxysmal atrial fibrillation noted and placed on amiodarone infusion.  Neurology continued to follow the patient due to extremity weakness.  CT head performed 11/6 revealed 3 small right-sided strokes 1 in the cerebellum and 1 in the right basal ganglia as well as 1 in the frontal lobe with a 4 mm area of bleeding.  CTA without large vessel occlusion.  He exhibited left-sided weakness.  EEG performed.  Hyperglycemia treated and resolved.  Tube feedings initiated 11/7. Exhibited some agitation and was left intubated.  PCCM consulted on 11/8 for assistance with ventilator weaning.  He was extubated on 11/9.  Lopressor started for blood  pressure control.  MRI brain performed 11/11 which showed numerous bilateral foci of acute ischemia right greater than left and MCA and PCA territories as well as the cerebellum.  Neurology continued to follow the patient and recommends aspirin 81 mg daily and if atrial fibrillation recurs to consider anticoagulation.  No statin needed given that LDL is at goal.  Diuretics held 11/12.  He remained in normal sinus rhythm on amiodarone.  AKI likely secondary to ATN from hemorrhagic shock/hypotension continued to improve.  Blood pressure slowly climbed and permissive hypertension allowed until 11/14.  Losartan 25 mg daily then added.  Later that day, he went into atrial fibrillation with rapid ventricular response and oral amiodarone was discontinued and bolused and started on amiodarone infusion.  He converted back into sinus rhythm.  Core track tube placed.  Hemoglobin remained stable.  White blood cell count trended upward and urinalysis was negative.  Chest x-ray with bibasilar atelectasis versus infiltrate.  Treated with vancomycin and Zosyn.  Was found to have urinary retention and a Foley catheter was placed.  He experienced diarrhea and C. difficile toxin was negative.  Metoprolol continued 12.5 mg BID. Losartan and spironolactone started and discontinued. Modified barium swallow completed and p.o. intake initiated.  He is currently tolerating dysphagia 2 diet with thin liquids.   He has had improvement in activity tolerance, balance, postural control as well as ability to compensate for deficits. He has had improvement in functional use RUE/LUE  and RLE/LLE as well as improvement in awareness. One LTG deemed adequate for d/c. Pt's dynamic standing balance is usually CGA with minimal, intermittent need for MinA to maintain balance. Family has been trained for  CGA and rare, intermittent instance of LOB.    Patient ID: James Estrada, male    DOB: 05/09/1941, 82 y.o.   MRN: [id number] Admit date:  10/04/2022 Discharge date: 10/20/2022  HPI  Finished home health therapy Has started OP SLP  Mod I dressing and bathing except for back  [place] ~3wks ago  Doing I/O caths and has f/u with Dr Louis Meckel from urology in a couple weeks  Has had dental work since d/c from hospital   Has seen Dr Skipper Cliche CVTS. Pain Inventory Average Pain 0 Pain Right Now 0 My pain is  N/A  LOCATION OF PAIN  N/A  BOWEL Number of stools per week: 1 Oral laxative use Yes  Type of laxative Miralax  BLADDER  In and out cath, frequency 5x Able to self cath Yes   Difficulty starting stream Yes  Incomplete bladder emptying  Sometimes   Mobility walk without assistance use a cane ability to climb steps?  yes do you drive?  no  Function retired I need assistance with the following:  toileting, meal prep, and household duties  Neuro/Psych bladder control problems  Prior Studies Any changes since last visit?  no  Physicians involved in your care Any changes since last visit?  no   Family History  Problem Relation Age of Onset   Heart disease Mother    Coronary artery disease Mother    Aneurysm Father    Diabetes Sister    Social History   Socioeconomic History   Marital status: Married    Spouse name: Not on file   Number of children: Not on file   Years of education: Not on file   Highest education level: Not on file  Occupational History   Not on file  Tobacco Use   Smoking status: Never   Smokeless tobacco: Never  Vaping Use   Vaping Use: Never used  Substance and Sexual Activity   Alcohol use: Yes    Comment: 2 or 3 drinks on weekends   Drug use: No   Sexual activity: Not on file  Other Topics Concern   Not on file  Social History Narrative   Not on file   Social Determinants of Health   Financial Resource Strain: Not on file  Food Insecurity: No Food Insecurity (09/22/2022)   Hunger Vital Sign    Worried About Running Out of Food in the Last Year: Never true     Ran Out of Food in the Last Year: Never true  Transportation Needs: No Transportation Needs (09/22/2022)   PRAPARE - Hydrologist (Medical): No    Lack of Transportation (Non-Medical): No  Physical Activity: Not on file  Stress: Not on file  Social Connections: Not on file   Past Surgical History:  Procedure Laterality Date   ABDOMINAL AORTIC ENDOVASCULAR STENT GRAFT N/A 05/04/2020   Procedure: ABDOMINAL AORTIC ENDOVASCULAR STENT GRAFT;  Surgeon: [name], MD;  Location: [facility];  Service: Vascular;  Laterality: N/A;   CATARACT EXTRACTION Bilateral 2009   DIAGNOSTIC LAPAROSCOPY     laparoscopic hernia repair   EYE SURGERY Bilateral    cataract removal   HERNIA REPAIR Bilateral 1999, 2006   JOINT REPLACEMENT Right ~2018   hip replacement   pheochromocytoma  1993   PROSTATECTOMY N/A 05/15/2013   Procedure: PROSTATECTOMY RETROPUBIC; SIMPLE OPEN PROSTATECTOMY;  Surgeon: [name], MD;  [facility];  Service: Urology;  Laterality: N/A;   REPAIR OF ACUTE  ASCENDING THORACIC AORTIC DISSECTION N/A 09/17/2022   Procedure: REPAIR OF ACUTE ASCENDING THORACIC AORTIC DISSECTION USING 28 MM HEMASHIELD PLATINUM VASCULAR GRAFT;  Surgeon: [name], MD;  Location: [facility];  Service: Vascular;  Laterality: N/A;  Median sternotomy   TOTAL ELBOW REPLACEMENT Left    > 30 years ago   TOTAL HIP ARTHROPLASTY Right 09/08/2017   Procedure: RIGHT TOTAL HIP ARTHROPLASTY ANTERIOR APPROACH;  Surgeon: [name], MD;  [facility];  Service: Orthopedics;  Laterality: Right;   ULTRASOUND GUIDANCE FOR VASCULAR ACCESS Bilateral 05/04/2020   Procedure: ULTRASOUND GUIDANCE FOR VASCULAR ACCESS;  Surgeon: [name], MD;  [facility];  Service: Vascular;  Laterality: Bilateral;   Past Medical History:  Diagnosis Date   AAA (abdominal aortic aneurysm) ([place])    last u/s done 07/18/17    Aneurysm of left internal iliac artery ([place]) 05/22/2022    Arthritis    Bradycardia    CVA (cerebral vascular accident) ([place]) 10/04/2022   Dysrhythmia    frequent PAC, for 20 years   GERD (gastroesophageal reflux disease)    occ, OTC   Hemorrhoids    History of hiatal hernia    Hyperlipidemia    Hypertension    S/P aortic dissection repair 09/18/2022   Seasonal allergies    Type 1 dissection of thoracic aorta ([place]) 10/26/2022   BP 137/85   Pulse 63   Ht 6' (1.829 m)   Wt 175 lb (79.4 kg)   SpO2 98%   BMI 23.73 kg/m   Opioid Risk Score:   Fall Risk Score:  `1  Depression screen [facility] 2/9     07/17/2015   10:55 AM  Depression screen PHQ 2/9  Decreased Interest 0  Down, Depressed, Hopeless 0  PHQ - 2 Score 0      Review of Systems  All other systems reviewed and are negative.     Objective:   Physical Exam Vitals and nursing note reviewed.  Constitutional:      Appearance: He is normal weight.  HENT:     Head: Normocephalic and atraumatic.  Eyes:     Extraocular Movements: Extraocular movements intact.     Conjunctiva/sclera: Conjunctivae normal.     Pupils: Pupils are equal, round, and reactive to light.  Musculoskeletal:     Right lower leg: No edema.     Left lower leg: No edema.  Skin:    General: Skin is warm and dry.  Neurological:     Mental Status: He is alert and oriented to person, place, and time.  Psychiatric:        Mood and Affect: Mood normal.        Behavior: Behavior normal.    Motor strength is 5/5 bilateral deltoid, bicep, tricep, grip, hip flexor, knee extensor, ankle dorsiflexor and plantar flexor Negative straight leg raise bilaterally Finger to thumb opposition mildly slowed bilaterally finger-nose-finger intact bilaterally Ambulates without assistive device no evidence of toe drag or knee instability. Gait is mildly wide-based. Speech disfluent.  Obviously word finding deficits.  Speech is mainly at a phrase level.  He is able to name simple objects such as glasses and pen however more  difficulty requiring cueing for stethoscope. Patient exhibits some confusion regarding completion of motor tasks requiring some gestural cueing although general receptive skills appear to be good      Assessment & Plan:   1.  History of right greater than left MCA and PCA infarcts he is left-handed.  He does have an aphasia, mainly expressive. The patient has mild fine motor deficits that are residual as well as mild balance deficits.  Patient also has evidence of apraxia. We discussed timeframe of recovery for aphasia which is usually 9 to 12 months post infarct and would expect his functional recovery to plateau prior to that time. Overall he is made excellent recovery from his multiple infarcts. At this point I would hold off on driving.  Would need ophthalmology evaluation prior to resumption of driving. I will see him back in 6 months

## 2023-01-02 ENCOUNTER — Encounter: Payer: Self-pay | Admitting: Speech Pathology

## 2023-01-02 ENCOUNTER — Ambulatory Visit: Payer: Medicare Other | Admitting: Speech Pathology

## 2023-01-02 DIAGNOSIS — R41841 Cognitive communication deficit: Secondary | ICD-10-CM

## 2023-01-02 DIAGNOSIS — R4701 Aphasia: Secondary | ICD-10-CM

## 2023-01-02 DIAGNOSIS — R2681 Unsteadiness on feet: Secondary | ICD-10-CM | POA: Diagnosis not present

## 2023-01-02 DIAGNOSIS — I69352 Hemiplegia and hemiparesis following cerebral infarction affecting left dominant side: Secondary | ICD-10-CM | POA: Diagnosis not present

## 2023-01-02 DIAGNOSIS — R2689 Other abnormalities of gait and mobility: Secondary | ICD-10-CM | POA: Diagnosis not present

## 2023-01-02 NOTE — Patient Instructions (Addendum)
   Word finding can be worse with fatigue - try to schedule visits or meals earlier in the day if you can  Consider going to restaurants at off times so it's quieter and less distracting  If you would like to make a short phone call, it's OK to write down topics, questions, names etc to have in front of you to help - the other person can't see it

## 2023-01-03 ENCOUNTER — Encounter: Payer: Self-pay | Admitting: Thoracic Surgery (Cardiothoracic Vascular Surgery)

## 2023-01-03 DIAGNOSIS — N319 Neuromuscular dysfunction of bladder, unspecified: Secondary | ICD-10-CM | POA: Diagnosis not present

## 2023-01-03 DIAGNOSIS — R339 Retention of urine, unspecified: Secondary | ICD-10-CM | POA: Diagnosis not present

## 2023-01-04 ENCOUNTER — Ambulatory Visit: Payer: Medicare Other | Admitting: Speech Pathology

## 2023-01-04 ENCOUNTER — Encounter: Payer: Self-pay | Admitting: Rehabilitation

## 2023-01-04 ENCOUNTER — Encounter: Payer: Self-pay | Admitting: Speech Pathology

## 2023-01-04 ENCOUNTER — Ambulatory Visit: Payer: Medicare Other | Admitting: Rehabilitation

## 2023-01-04 ENCOUNTER — Other Ambulatory Visit: Payer: Self-pay

## 2023-01-04 DIAGNOSIS — R4701 Aphasia: Secondary | ICD-10-CM

## 2023-01-04 DIAGNOSIS — R41841 Cognitive communication deficit: Secondary | ICD-10-CM | POA: Diagnosis not present

## 2023-01-04 DIAGNOSIS — R2681 Unsteadiness on feet: Secondary | ICD-10-CM | POA: Diagnosis not present

## 2023-01-04 DIAGNOSIS — I69352 Hemiplegia and hemiparesis following cerebral infarction affecting left dominant side: Secondary | ICD-10-CM | POA: Diagnosis not present

## 2023-01-04 DIAGNOSIS — R2689 Other abnormalities of gait and mobility: Secondary | ICD-10-CM | POA: Diagnosis not present

## 2023-01-04 NOTE — Therapy (Signed)
OUTPATIENT SPEECH LANGUAGE PATHOLOGY TREATMENT NOTE   Patient Name: BRENTEN HUITRON MRN: [id number] DOB:1941-02-01, 82 y.o., male Today's Date: 01/04/2023  PCP: [name], MD REFERRING PROVIDER: [name], MD  END OF SESSION:   End of Session - 01/04/23 1231     Visit Number 4    Number of Visits 25    Date for SLP Re-Evaluation 03/20/23    SLP Start Time 26    SLP Stop Time  [id number]    SLP Time Calculation (min) 45 min    Activity Tolerance Patient tolerated treatment well              Past Medical History:  Diagnosis Date   AAA (abdominal aortic aneurysm) ([place])    last u/s done 07/18/17    Aneurysm of left internal iliac artery ([place]) 05/22/2022   Arthritis    Bradycardia    CVA (cerebral vascular accident) ([place]) 10/04/2022   Dysrhythmia    frequent PAC, for 20 years   GERD (gastroesophageal reflux disease)    occ, OTC   Hemorrhoids    History of hiatal hernia    Hyperlipidemia    Hypertension    S/P aortic dissection repair 09/18/2022   Seasonal allergies    Type 1 dissection of thoracic aorta ([place]) 10/26/2022   Past Surgical History:  Procedure Laterality Date   ABDOMINAL AORTIC ENDOVASCULAR STENT GRAFT N/A 05/04/2020   Procedure: ABDOMINAL AORTIC ENDOVASCULAR STENT GRAFT;  Surgeon: [name], MD;  Location: [place];  Service: Vascular;  Laterality: N/A;   CATARACT EXTRACTION Bilateral 2009   DIAGNOSTIC LAPAROSCOPY     laparoscopic hernia repair   EYE SURGERY Bilateral    cataract removal   HERNIA REPAIR Bilateral 1999, 2006   JOINT REPLACEMENT Right ~2018   hip replacement   pheochromocytoma  1993   PROSTATECTOMY N/A 05/15/2013   Procedure: PROSTATECTOMY RETROPUBIC; SIMPLE OPEN PROSTATECTOMY;  Surgeon: [name], MD;  Location[facility];  Service: Urology;  Laterality: N/A;   REPAIR OF ACUTE ASCENDING THORACIC AORTIC DISSECTION N/A 09/17/2022   Procedure: REPAIR OF ACUTE ASCENDING THORACIC AORTIC DISSECTION USING 28 MM HEMASHIELD PLATINUM  VASCULAR GRAFT;  Surgeon: [name], MD;  Location: [place];  Service: Vascular;  Laterality: N/A;  Median sternotomy   TOTAL ELBOW REPLACEMENT Left    > 30 years ago   TOTAL HIP ARTHROPLASTY Right 09/08/2017   Procedure: RIGHT TOTAL HIP ARTHROPLASTY ANTERIOR APPROACH;  Surgeon: [name], MD;  Location[facility];  Service: Orthopedics;  Laterality: Right;   ULTRASOUND GUIDANCE FOR VASCULAR ACCESS Bilateral 05/04/2020   Procedure: ULTRASOUND GUIDANCE FOR VASCULAR ACCESS;  Surgeon: [name], MD;  Location: [facility];  Service: Vascular;  Laterality: Bilateral;   Patient Active Problem List   Diagnosis Date Noted   Type 1 dissection of thoracic aorta ([place]) 10/26/2022   CVA (cerebral vascular accident) ([place]) 10/04/2022   Sepsis with acute renal failure without septic shock ([place]) 09/30/2022   S/P aortic dissection repair 09/18/2022   Status post surgery 09/17/2022   Aneurysm of left internal iliac artery ([place]) 05/22/2022   Colon cancer screening 05/22/2022   Essential hypertension 05/22/2022   Hemorrhoids 05/22/2022   Inguinal hernia 05/22/2022   Irritable bowel syndrome 05/22/2022   Mixed hyperlipidemia 05/22/2022   Pure hypercholesterolemia 05/22/2022   Thrombocytopenia ([place]) 05/22/2022   Allergic rhinitis 05/22/2022   AAA (abdominal aortic aneurysm) without rupture ([place]) 05/04/2020   Trochanteric bursitis, right hip 08/20/2018   History of right  hip replacement 08/20/2018   Status post total replacement of right hip 09/08/2017   Pain of right hip joint 08/07/2017   Unilateral primary osteoarthritis, right hip 08/07/2017   BPH (benign prostatic hypertrophy) with urinary obstruction 05/15/2013    ONSET DATE: 09/17/22  REFERRING DIAG: R13.10 (ICD-10-CM) - Dysphagia, unspecified  THERAPY DIAG:  Aphasia  Rationale for Evaluation and Treatment: Rehabilitation  SUBJECTIVE:   SUBJECTIVE STATEMENT:  PAIN:  Are you having pain? No    OBJECTIVE:  Pt is a  82 y/o M admitted to [facility] on 09/17/22 with type 1 aortic dissection requiring emergent repair. Head CT 11/6 acute infarcts within R MCA involvement, including R cerebellum, basal ganglia, frontal lobe. ETT 11/4-11/9. PMH AAA s/p endovascular repair (2020), HTN, HLD, GERD.   TODAY'S TREATMENT:                                                                                                                                         DATE:    01/04/23: To target sentence generation, word finding and reading comprehension  Pharmacist, hospital ([facility] was utilized. The pt generated 3 subjects and objects for 3 verbs (cast, operate, decorate), for a total of 9 subject objects. Pt required usual mod semantic and questioning cues cues. Pt generated 3 complex sentences by answering "wh" questions. Pt required occasional min semantic cues to generate complex sentences. Targeted written expression and divergent naming in personally relevant category (army) he generated 9 army terms, Id'd written errors, however required mod A to self correct. They continue to complete all homework consistently. Provided information on [employer] and [employer] news with demonstration   01/02/23: Targeted verbal compensations for aphasia, initially using Semantic Feature Analysis chart fading to structured task generating 3 salient descriptions for basic objects, progressing from photo to written word with occasional min questioning cues and semantic cues. Targeted word finding and auditory comprehension in convergent naming task. Ingrid named 10 items with rare repetition and required semantic cues and 1st letter cues for 3/10. Targeted divergent naming in personally relevant category (stock market) - Lekendrick named 15 words with extended time, usual mod semantic and questioning cues. Generated strategy to use written notes to make phone calls as needed (see patient instructions)  12/28/22:  Aphasia ID card provided.  To target  word finding, sentence generation, reading comrehension  Pharmacist, hospital ([facility]) was utilized. The pt generated 3 subjects and objects for 3 verbs (drive, measure, kick), for a total of 9 subject objects. Pt required extended time and usual min to mod semantic and phonemic  cues. Pt generated 3 complex sentences by answering "wh" questions. Pt required usual min semantic and written  cues to generate complex sentences and comprehend "wh" questions. Divergent naming targeted in personally relevant category, golf, Daiven generated 15 words with extended time, usual min semantic cues. With questioning cues and instruction to "tell  me about this" Matt successfully used verbal compensations to communicate a favorite restaurant. Targeted reading comprehension and phone access - Sham required extended time, visual cues to locate and select text message icon. He read 3 sentence text with questioning cues for accurate comprehension of text.   12/26/22 (eval day): initiated education re: word finding activities to do at home, environmental compensations to maximize communication, family behaviors to support communication     PATIENT EDUCATION: Education details: See patient instructions, see today's treatment, compensations for aphasia Person educated: Patient and Spouse Education method: Explanation, Verbal cues, and Handouts Education comprehension: verbal cues required and needs further education     GOALS: Goals reviewed with patient? Yes   SHORT TERM GOALS: Target date: 01/23/23   Pt will name 10 items in personally relevant categories over 3 sessions with occasional min A Baseline: Goal status: ONGOING   2.  Pt will write 4 item grocery list with occasional min A Baseline:  Goal status: ONGOING   3.  Pt will respond to 3 texts with occasional min A over 1 week Baseline:  Goal status: ONGOING   4.  Pt will generate complex verbal sentences 3x using Research officer, political party with rare min A over 2 sessions Baseline:  Goal status: ONGOING   5.  Pt will comprehend 4 sentence passage/email with extended time and rare min A Baseline:  Goal status: ONGOING   6.  Pt will employ compensations for aphasia in structured language tasks with occasional min A Baseline:  Goal status: ONGOING   LONG TERM GOALS: Target date: 03/20/23   Pt will complete complex naming tasks with 80% accuracy and occasional min A Baseline:  Goal status: ONGOING   2.  Pt will carryover verbal compensations for aphasia with occasional min A in conversation as needed over 2 sessions Baseline:  Goal status: ONGOING   3.  Pt will carryover 3 strategies to successfully communicate in community with clerks, servers etc Baseline:  Goal status: ONGOING   4.  Pt will write 3 sentence text or email with occasional min A, external aids and occasional min A  Baseline:  Goal status: ONGOING   5.  Pt will improve score on Communicative Participation Item Bank by 4 points Baseline: 14/30 Goal status: ONGOING       ASSESSMENT:   CLINICAL IMPRESSION: Patient is a 82 y.o. male who was seen today for aphasia s/p CVA. He is accompanied by his spouse [name]. Prior to CVA, Zarren was active golfing, managing finances,  keeping up with the stock market and socializing with friends. At this time, he has difficulty using his phone and remote and is not texting due to impaired written expression. [name] is managing his medications and appointments. He has difficulty reading numbers, letters and words. [name] reports he is "quick to give up" when he encounters a communication breakdown. They plan to move into independent living at [facility] in the spring.  Ongoing training in compensations for aphasia and targeting written and verbal expression as well as reading comprehension. I recommend skilled ST to maximize verbal and written communication and cognition for safety, to reduce  caregiver burden and QOL.    OBJECTIVE IMPAIRMENTS: include memory and aphasia. These impairments are limiting patient from managing medications, managing appointments, managing finances, household responsibilities, ADLs/IADLs, and effectively communicating at home and in community. Factors affecting potential to achieve goals and functional outcome are ability to learn/carryover information. Patient will benefit from skilled SLP services to address  above impairments and improve overall function.   REHAB POTENTIAL: Good   PLAN:   SLP FREQUENCY: 2x/week   SLP DURATION: 12 weeks   PLANNED INTERVENTIONS: Language facilitation, Environmental controls, Cueing hierachy, Cognitive reorganization, Internal/external aids, Functional tasks, and Multimodal communication approach         Tonetta Napoles, Annye Rusk, CCC-SLP 01/04/2023, 3:34 PM

## 2023-01-04 NOTE — Therapy (Signed)
OUTPATIENT SPEECH LANGUAGE PATHOLOGY TREATMENT NOTE   Patient Name: James Estrada MRN: [id number] DOB:[birth year], 82 y.o., male Today's Date: 01/04/2023  PCP: [name], MD REFERRING PROVIDER: [name], MD  END OF SESSION:     Past Medical History:  Diagnosis Date   AAA (abdominal aortic aneurysm) ([place])    last u/s done 07/18/17    Aneurysm of left internal iliac artery ([place]) 05/22/2022   Arthritis    Bradycardia    CVA (cerebral vascular accident) ([place]) 10/04/2022   Dysrhythmia    frequent PAC, for 20 years   GERD (gastroesophageal reflux disease)    occ, OTC   Hemorrhoids    History of hiatal hernia    Hyperlipidemia    Hypertension    S/P aortic dissection repair 09/18/2022   Seasonal allergies    Type 1 dissection of thoracic aorta ([place]) 10/26/2022   Past Surgical History:  Procedure Laterality Date   ABDOMINAL AORTIC ENDOVASCULAR STENT GRAFT N/A 05/04/2020   Procedure: ABDOMINAL AORTIC ENDOVASCULAR STENT GRAFT;  Surgeon: [name], MD;  Location: [place];  Service: Vascular;  Laterality: N/A;   CATARACT EXTRACTION Bilateral 2009   DIAGNOSTIC LAPAROSCOPY     laparoscopic hernia repair   EYE SURGERY Bilateral    cataract removal   HERNIA REPAIR Bilateral 1999, 2006   JOINT REPLACEMENT Right ~2018   hip replacement   pheochromocytoma  1993   PROSTATECTOMY N/A 05/15/2013   Procedure: PROSTATECTOMY RETROPUBIC; SIMPLE OPEN PROSTATECTOMY;  Surgeon: [name], MD;  [facility];  Service: Urology;  Laterality: N/A;   REPAIR OF ACUTE ASCENDING THORACIC AORTIC DISSECTION N/A 09/17/2022   Procedure: REPAIR OF ACUTE ASCENDING THORACIC AORTIC DISSECTION USING 28 MM HEMASHIELD PLATINUM VASCULAR GRAFT;  Surgeon: [name], MD;  Location: [place];  Service: Vascular;  Laterality: N/A;  Median sternotomy   TOTAL ELBOW REPLACEMENT Left    > 30 years ago   TOTAL HIP ARTHROPLASTY Right 09/08/2017   Procedure: RIGHT TOTAL HIP ARTHROPLASTY ANTERIOR  APPROACH;  Surgeon: [name], MD;  [facility];  Service: Orthopedics;  Laterality: Right;   ULTRASOUND GUIDANCE FOR VASCULAR ACCESS Bilateral 05/04/2020   Procedure: ULTRASOUND GUIDANCE FOR VASCULAR ACCESS;  Surgeon: [name], MD;  Location: [facility] OR;  Service: Vascular;  Laterality: Bilateral;   Patient Active Problem List   Diagnosis Date Noted   Type 1 dissection of thoracic aorta ([place]) 10/26/2022   CVA (cerebral vascular accident) ([place]) 10/04/2022   Sepsis with acute renal failure without septic shock ([place]) 09/30/2022   S/P aortic dissection repair 09/18/2022   Status post surgery 09/17/2022   Aneurysm of left internal iliac artery ([place]) 05/22/2022   Colon cancer screening 05/22/2022   Essential hypertension 05/22/2022   Hemorrhoids 05/22/2022   Inguinal hernia 05/22/2022   Irritable bowel syndrome 05/22/2022   Mixed hyperlipidemia 05/22/2022   Pure hypercholesterolemia 05/22/2022   Thrombocytopenia ([place]) 05/22/2022   Allergic rhinitis 05/22/2022   AAA (abdominal aortic aneurysm) without rupture ([place]) 05/04/2020   Trochanteric bursitis, right hip 08/20/2018   History of right hip replacement 08/20/2018   Status post total replacement of right hip 09/08/2017   Pain of right hip joint 08/07/2017   Unilateral primary osteoarthritis, right hip 08/07/2017   BPH (benign prostatic hypertrophy) with urinary obstruction 05/15/2013    ONSET DATE: 09/17/22  REFERRING DIAG: R13.10 (ICD-10-CM) - Dysphagia, unspecified  THERAPY DIAG:  Aphasia  Cognitive communication deficit  Rationale for Evaluation and Treatment: Rehabilitation  SUBJECTIVE:  SUBJECTIVE STATEMENT:  PAIN:  Are you having pain? No    OBJECTIVE:  Pt is a 82 y/o M admitted to [facility] on 09/17/22 with type 1 aortic dissection requiring emergent repair. Head CT 11/6 acute infarcts within R MCA involvement, including R cerebellum, basal ganglia, frontal lobe. ETT 11/4-11/9. PMH AAA s/p endovascular  repair (2020), HTN, HLD, GERD.   TODAY'S TREATMENT:                                                                                                                                         DATE:   01/02/23: Targeted verbal compensations for aphasia, initially using Semantic Feature Analysis chart fading to structured task generating 3 salient descriptions for basic objects, progressing from photo to written word with occasional min questioning cues and semantic cues. Targeted word finding and auditory comprehension in convergent naming task. Emmert named 10 items with rare repetition and required semantic cues and 1st letter cues for 3/10. Targeted divergent naming in personally relevant category (stock market) - James Estrada named 15 words with extended time, usual mod semantic and questioning cues. Generated strategy to use written notes to make phone calls as needed (see patient instructions)  12/28/22:  Aphasia ID card provided.  To target word finding, sentence generation, reading comrehension  Pharmacist, hospital ([facility]) was utilized. The pt generated 3 subjects and objects for 3 verbs (drive, measure, kick), for a total of 9 subject objects. Pt required extended time and usual min to mod semantic and phonemic  cues. Pt generated 3 complex sentences by answering "wh" questions. Pt required usual min semantic and written  cues to generate complex sentences and comprehend "wh" questions. Divergent naming targeted in personally relevant category, golf, James Estrada generated 15 words with extended time, usual min semantic cues. With questioning cues and instruction to "tell me about this" James Estrada successfully used verbal compensations to communicate a favorite restaurant. Targeted reading comprehension and phone access - Chin required extended time, visual cues to locate and select text message icon. He read 3 sentence text with questioning cues for accurate comprehension of text.   12/26/22 (eval  day): initiated education re: word finding activities to do at home, environmental compensations to maximize communication, family behaviors to support communication     PATIENT EDUCATION: Education details: See patient instructions, see today's treatment, compensations for aphasia Person educated: Patient and Spouse Education method: Explanation, Verbal cues, and Handouts Education comprehension: verbal cues required and needs further education     GOALS: Goals reviewed with patient? Yes   SHORT TERM GOALS: Target date: 01/23/23   Pt will name 10 items in personally relevant categories over 3 sessions with occasional min A Baseline: Goal status: ONGOING   2.  Pt will write 4 item grocery list with occasional min A Baseline:  Goal status: ONGOING   3.  Pt will respond to 3 texts with occasional min  A over 1 week Baseline:  Goal status: ONGOING   4.  Pt will generate complex verbal sentences 3x using Pharmacist, hospital with rare min A over 2 sessions Baseline:  Goal status: ONGOING   5.  Pt will comprehend 4 sentence passage/email with extended time and rare min A Baseline:  Goal status: ONGOING   6.  Pt will employ compensations for aphasia in structured language tasks with occasional min A Baseline:  Goal status: ONGOING   LONG TERM GOALS: Target date: 03/20/23   Pt will complete complex naming tasks with 80% accuracy and occasional min A Baseline:  Goal status: ONGOING   2.  Pt will carryover verbal compensations for aphasia with occasional min A in conversation as needed over 2 sessions Baseline:  Goal status: ONGOING   3.  Pt will carryover 3 strategies to successfully communicate in community with clerks, servers etc Baseline:  Goal status: ONGOING   4.  Pt will write 3 sentence text or email with occasional min A, external aids and occasional min A  Baseline:  Goal status: ONGOING   5.  Pt will improve score on Communicative Participation  Item Bank by 4 points Baseline: 14/30 Goal status: ONGOING       ASSESSMENT:   CLINICAL IMPRESSION: Patient is a 82 y.o. male who was seen today for aphasia s/p CVA. He is accompanied by his spouse [name]. Prior to CVA, Stephanos was active golfing, managing finances,  keeping up with the stock market and socializing with friends. At this time, he has difficulty using his phone and remote and is not texting due to impaired written expression. [name] is managing his medications and appointments. He has difficulty reading numbers, letters and words. [name] reports he is "quick to give up" when he encounters a communication breakdown. They plan to move into independent living at [facility] in the spring. I recommend skilled ST to maximize verbal and written communication and cognition for safety, to reduce caregiver burden and QOL.    OBJECTIVE IMPAIRMENTS: include memory and aphasia. These impairments are limiting patient from managing medications, managing appointments, managing finances, household responsibilities, ADLs/IADLs, and effectively communicating at home and in community. Factors affecting potential to achieve goals and functional outcome are ability to learn/carryover information. Patient will benefit from skilled SLP services to address above impairments and improve overall function.   REHAB POTENTIAL: Good   PLAN:   SLP FREQUENCY: 2x/week   SLP DURATION: 12 weeks   PLANNED INTERVENTIONS: Language facilitation, Environmental controls, Cueing hierachy, Cognitive reorganization, Internal/external aids, Functional tasks, and Multimodal communication approach         Jasmarie Coppock, Annye Rusk, CCC-SLP 01/04/2023, 9:01 AM

## 2023-01-04 NOTE — Therapy (Unsigned)
OUTPATIENT PHYSICAL THERAPY NEURO EVALUATION   Patient Name: James Estrada MRN: [id number] DOB:[birth year], 82 y.o., male Today's Date: 01/05/2023   PCP: [name], MD REFERRING PROVIDER: Jolaine Artist, MD  END OF SESSION:  PT End of Session - 01/04/23 1324     Visit Number 1    Number of Visits 17    Date for PT Re-Evaluation 03/05/23    Authorization Type BCBS Medicare (needs 10th visit PN)    Progress Note Due on Visit 10    PT Start Time 1318    PT Stop Time 1402    PT Time Calculation (min) 44 min    Activity Tolerance Patient tolerated treatment well    Behavior During Therapy [facility] for tasks assessed/performed             Past Medical History:  Diagnosis Date   AAA (abdominal aortic aneurysm) ([place])    last u/s done 07/18/17    Aneurysm of left internal iliac artery ([place]) 05/22/2022   Arthritis    Bradycardia    CVA (cerebral vascular accident) ([place]) 10/04/2022   Dysrhythmia    frequent PAC, for 20 years   GERD (gastroesophageal reflux disease)    occ, OTC   Hemorrhoids    History of hiatal hernia    Hyperlipidemia    Hypertension    S/P aortic dissection repair 09/18/2022   Seasonal allergies    Type 1 dissection of thoracic aorta ([place]) 10/26/2022   Past Surgical History:  Procedure Laterality Date   ABDOMINAL AORTIC ENDOVASCULAR STENT GRAFT N/A 05/04/2020   Procedure: ABDOMINAL AORTIC ENDOVASCULAR STENT GRAFT;  Surgeon: [name], MD;  Location: [place];  Service: Vascular;  Laterality: N/A;   CATARACT EXTRACTION Bilateral 2009   DIAGNOSTIC LAPAROSCOPY     laparoscopic hernia repair   EYE SURGERY Bilateral    cataract removal   HERNIA REPAIR Bilateral 1999, 2006   JOINT REPLACEMENT Right ~2018   hip replacement   pheochromocytoma  1993   PROSTATECTOMY N/A 05/15/2013   Procedure: PROSTATECTOMY RETROPUBIC; SIMPLE OPEN PROSTATECTOMY;  Surgeon: [name], MD;  Location: [facility];  Service: Urology;  Laterality: N/A;   REPAIR OF ACUTE  ASCENDING THORACIC AORTIC DISSECTION N/A 09/17/2022   Procedure: REPAIR OF ACUTE ASCENDING THORACIC AORTIC DISSECTION USING 28 MM HEMASHIELD PLATINUM VASCULAR GRAFT;  Surgeon: [name], MD;  Location: [place];  Service: Vascular;  Laterality: N/A;  Median sternotomy   TOTAL ELBOW REPLACEMENT Left    > 30 years ago   TOTAL HIP ARTHROPLASTY Right 09/08/2017   Procedure: RIGHT TOTAL HIP ARTHROPLASTY ANTERIOR APPROACH;  Surgeon: [name], MD;  Location: [facility];  Service: Orthopedics;  Laterality: Right;   ULTRASOUND GUIDANCE FOR VASCULAR ACCESS Bilateral 05/04/2020   Procedure: ULTRASOUND GUIDANCE FOR VASCULAR ACCESS;  Surgeon: [name], MD;  Location[facility];  Service: Vascular;  Laterality: Bilateral;   Patient Active Problem List   Diagnosis Date Noted   Type 1 dissection of thoracic aorta ([place]) 10/26/2022   CVA (cerebral vascular accident) ([place]) 10/04/2022   Sepsis with acute renal failure without septic shock ([place]) 09/30/2022   S/P aortic dissection repair 09/18/2022   Status post surgery 09/17/2022   Aneurysm of left internal iliac artery ([place]) 05/22/2022   Colon cancer screening 05/22/2022   Essential hypertension 05/22/2022   Hemorrhoids 05/22/2022   Inguinal hernia 05/22/2022   Irritable bowel syndrome 05/22/2022   Mixed hyperlipidemia 05/22/2022   Pure hypercholesterolemia 05/22/2022   Thrombocytopenia (  [place]) 05/22/2022   Allergic rhinitis 05/22/2022   AAA (abdominal aortic aneurysm) without rupture ([place]) 05/04/2020   Trochanteric bursitis, right hip 08/20/2018   History of right hip replacement 08/20/2018   Status post total replacement of right hip 09/08/2017   Pain of right hip joint 08/07/2017   Unilateral primary osteoarthritis, right hip 08/07/2017   BPH (benign prostatic hypertrophy) with urinary obstruction 05/15/2013    ONSET DATE: 12/28/2022 (CVA on 09/17/22)  REFERRING DIAG: I63.19 (ICD-10-CM) - Cerebrovascular accident (CVA) due to  embolism of other precerebral artery ([place])  THERAPY DIAG:  Unsteadiness on feet  Hemiplegia and hemiparesis following cerebral infarction affecting left dominant side ([place])  Other abnormalities of gait and mobility  Rationale for Evaluation and Treatment: Rehabilitation  SUBJECTIVE:                                                                                                                                                                                             SUBJECTIVE STATEMENT: Per patient, "physically I think I'm doing pretty good.  I haven't been troubled.  I have an in and out cath so that is different."  Pt accompanied by:  Wife, [name]   PERTINENT HISTORY:  Pt is a 82 y/o M admitted to [facility] on 09/17/22 with type 1 aortic dissection requiring emergent repair. Head CT 11/6 acute infarcts within R MCA involvement, including R cerebellum, basal ganglia, frontal lobe. ETT 11/4-11/9. PMH AAA s/p endovascular repair (2020), HTN, HLD, GERD. Hosoitalized 09/18/23 to 10/21/23.Had [place], PT, SLP.    PAIN:  Are you having pain? No  PRECAUTIONS: Fall  WEIGHT BEARING RESTRICTIONS: No  FALLS: Has patient fallen in last 6 months? Yes. Number of falls 1  LIVING ENVIRONMENT: Lives with: lives with their family Lives in: House/apartment (lives at daughters house-will be moving to [place] in April/May to single home).  Stairs: Yes: Internal: flight but doesn't use steps; on left going up and External: 2 steps; none Has following equipment at home: Single point cane, shower chair, Grab bars, and tub/shower   PLOF: Independent  PATIENT GOALS: "Speech is my biggest goal.  I have a very difficult time finding words. Also to be more balanced."   OBJECTIVE:   DIAGNOSTIC FINDINGS: CT head performed 11/6 revealed 3 small right-sided strokes 1 in the cerebellum and 1 in the right basal ganglia as well as 1 in the frontal lobe with a 4 mm area of bleeding.  CTA without large vessel  occlusion.  He exhibited left-sided weakness.  EEG performed.  Hyperglycemia treated and resolved.  COGNITION: Overall cognitive status: Within functional limits for tasks assessed  SENSATION: WFL  COORDINATION: WFL   POSTURE: No Significant postural limitations  LOWER EXTREMITY ROM:      Right Eval Left Eval  Hip flexion    Hip extension    Hip abduction    Hip adduction    Hip internal rotation    Hip external rotation    Knee flexion    Knee extension    Ankle dorsiflexion    Ankle plantarflexion    Ankle inversion    Ankle eversion     (Blank rows = not tested) All grossly WFL from seated position   LOWER EXTREMITY MMT:    MMT Right Eval Left Eval  Hip flexion 4/5 3+/5  Hip extension    Hip abduction 4/5 4/5  Hip adduction 4/5 3+/5  Hip internal rotation    Hip external rotation    Knee flexion 5/5 5/5  Knee extension 5/5 4/5  Ankle dorsiflexion 4/5 3+/5  Ankle plantarflexion    Ankle inversion    Ankle eversion    (Blank rows = not tested) all tested in seated position     TRANSFERS: Assistive device utilized: None  Sit to stand: SBA Stand to sit: Modified independence Chair to chair: SBA Floor:  n/a   STAIRS: Level of Assistance: SBA Stair Negotiation Technique: Step to Pattern Alternating Pattern  Forwards with Single Rail on Right Bilateral Rails Number of Stairs: 4 x 2 reps   Height of Stairs: 6  Comments: He initially performed stairs in step to pattern leading with weaker leg.  PT asked if he could perform in alternating pattern and he was able to do so without difficulty with single hand rail.   GAIT: Gait pattern: step through pattern, Left foot flat, and lateral hip instability Distance walked: 300' throughout session and FGA assessment  Assistive device utilized: None Level of assistance: SBA and CGA Comments: Performed all testing and gait in session without cane as he ambulates at home without cane.  Requires  up to CGA  for higher level balance tasks.  Continue to recommend use of cane in community.    FUNCTIONAL TESTS:  10 meter walk test: 10.38 secs=3.16 ft/sec  Functional gait assessment: 19/30   [facility] PT Assessment - 01/04/23 1358       Functional Gait  Assessment   Gait assessed  Yes    Gait Level Surface Walks 20 ft in less than 7 sec but greater than 5.5 sec, uses assistive device, slower speed, mild gait deviations, or deviates 6-10 in outside of the 12 in walkway width.   6.97 secs   Change in Gait Speed Able to smoothly change walking speed without loss of balance or gait deviation. Deviate no more than 6 in outside of the 12 in walkway width.    Gait with Horizontal Head Turns Performs head turns smoothly with slight change in gait velocity (eg, minor disruption to smooth gait path), deviates 6-10 in outside 12 in walkway width, or uses an assistive device.    Gait with Vertical Head Turns Performs task with slight change in gait velocity (eg, minor disruption to smooth gait path), deviates 6 - 10 in outside 12 in walkway width or uses assistive device    Gait and Pivot Turn Pivot turns safely in greater than 3 sec and stops with no loss of balance, or pivot turns safely within 3 sec and stops with mild imbalance, requires small steps to catch balance.    Step Over Obstacle Is able to step over  2 stacked shoe boxes taped together (9 in total height) without changing gait speed. No evidence of imbalance.    Gait with Narrow Base of Support Ambulates less than 4 steps heel to toe or cannot perform without assistance.    Gait with Eyes Closed Walks 20 ft, slow speed, abnormal gait pattern, evidence for imbalance, deviates 10-15 in outside 12 in walkway width. Requires more than 9 sec to ambulate 20 ft.    Ambulating Backwards Walks 20 ft, uses assistive device, slower speed, mild gait deviations, deviates 6-10 in outside 12 in walkway width.    Steps Alternating feet, must use rail.    Total Score 19     FGA comment: 19-24 = medium risk fall                TODAY'S TREATMENT:                                                                                                                              DATE: None initiated today     PATIENT EDUCATION: Education details: Evaluation results, goals, POC and follow up  Person educated: Patient and Spouse Education method: Explanation and Verbal cues Education comprehension: verbalized understanding and needs further education  HOME EXERCISE PROGRAM: None initiated today   GOALS: Goals reviewed with patient? Yes  SHORT TERM GOALS: Target date: 02/02/23  Pt will be IND with initial HEP in order to indicate improved functional mobility and dec fall risk. Baseline: Goal status: INITIAL  2.  Pt will ambulate 150' without AD mod I level in order to indicate more independent household ambulation.   Baseline:  Goal status: INITIAL  3.  Pt will improve FGA to >/=23/30 in order to indicate dec fall risk.  Baseline: 19/30 Goal status: INITIAL  4.  Pt will maintain gait speed to >/=3.0 ft/sec w/o AD while scanning environment without overt LOB in order to indicate dec fall risk.  Baseline:  Goal status: INITIAL  5.  Will assess MCTSIB and update goals to reflect progress.  Baseline:  Goal status: INITIAL  6.  Pt will negotiate up/down 4 steps with single rail in alt pattern, up/down ramp and curb and ambulate x 500' outdoors over unlevel paved surfaces at S level without AD in order to indicate improved community mobility.   Baseline:  Goal status: INITIAL  LONG TERM GOALS: Target date: 03/05/23  Pt will be IND with final HEP in order to indicate improved functional mobility and dec fall risk. Baseline:  Goal status: INITIAL  2.  Pt will improve FGA to >/=26/30 in order to indicate dec fall risk.   Baseline: 19/30 Goal status: INITIAL  3.  Will add LTG for MTCSIB once assessed.  Baseline:  Goal status: INITIAL  4.  Pt  will negotiate up/down 8 steps with single rail, up/down ramp and curb and ambulate x 1000' outdoors over unlevel paved surfaces without AD at mod I level while  scanning environment in order to indicate improved community mobility.   Baseline:  Goal status: INITIAL    ASSESSMENT:  CLINICAL IMPRESSION: Patient is a 82 y.o. male who was seen today for physical therapy evaluation and treatment for CVA. 82 y.o. male, left- handed, who presented from home to the emergency department via EMS on 09/17/2022 with acute onset of bilateral leg weakness and left upper back pain.  The patient's medical history is significant for multiple joint replacements and abdominal aortic aneurysm status post stent graft placement July, 2021. CT dissection protocol revealed acute aortic dissection with retroperitoneal blood adjacent to the left psoas muscle.  Emergent aortic dissection repair with effort to spare the artery supplying the spinal cord emergently necessary and the patient was admitted by Dr. Roxan Hockey.  The patient was counseled regarding emergent need of repair of type I dissection.  He underwent placement of 30 mm Hemashield graft by Dr. Roxan Hockey on 11/5 and was transferred to the ICU.  Note history of HTN, HLD, GERD and AAA repair in 2020.    OBJECTIVE IMPAIRMENTS: Abnormal gait, cardiopulmonary status limiting activity, decreased activity tolerance, decreased balance, decreased endurance, decreased mobility, decreased strength, and impaired perceived functional ability.   ACTIVITY LIMITATIONS: carrying, lifting, standing, squatting, stairs, bathing, and locomotion level  PARTICIPATION LIMITATIONS: meal prep, medication management, personal finances, driving, shopping, community activity, and yard work  PERSONAL FACTORS: Age and 3+ comorbidities: see above  are also affecting patient's functional outcome.   REHAB POTENTIAL: Good  CLINICAL DECISION MAKING: Evolving/moderate complexity  EVALUATION  COMPLEXITY: Moderate  PLAN:  PT FREQUENCY: 2x/week  PT DURATION: 8 weeks  PLANNED INTERVENTIONS: Therapeutic exercises, Therapeutic activity, Neuromuscular re-education, Balance training, Gait training, Patient/Family education, Self Care, and Joint mobilization  PLAN FOR NEXT SESSION: assess MCTSIB and update STG/LTG, initiate HEP for LLE strengthening and balance based on testing results, gait without device, outdoor gait, gait while scanning environment, dual tasking   Cameron Sprang, PT, MPT [facility] [address] [place], [place], [address] Phone: [phone]   Fax:  [phone] 01/05/23, 7:14 AM

## 2023-01-05 ENCOUNTER — Ambulatory Visit
Admission: RE | Admit: 2023-01-05 | Discharge: 2023-01-05 | Disposition: A | Discharge status: Home / Self Care | Payer: Medicare Other | Source: Ambulatory Visit | Attending: Thoracic Surgery (Cardiothoracic Vascular Surgery) | Admitting: Thoracic Surgery (Cardiothoracic Vascular Surgery)

## 2023-01-05 ENCOUNTER — Other Ambulatory Visit: Payer: Self-pay | Admitting: Thoracic Surgery (Cardiothoracic Vascular Surgery)

## 2023-01-05 DIAGNOSIS — R339 Retention of urine, unspecified: Secondary | ICD-10-CM | POA: Diagnosis not present

## 2023-01-05 DIAGNOSIS — Z9889 Other specified postprocedural states: Secondary | ICD-10-CM

## 2023-01-05 DIAGNOSIS — K449 Diaphragmatic hernia without obstruction or gangrene: Secondary | ICD-10-CM | POA: Diagnosis not present

## 2023-01-05 DIAGNOSIS — I71019 Dissection of thoracic aorta, unspecified: Secondary | ICD-10-CM | POA: Diagnosis not present

## 2023-01-05 DIAGNOSIS — I723 Aneurysm of iliac artery: Secondary | ICD-10-CM | POA: Diagnosis not present

## 2023-01-05 DIAGNOSIS — I714 Abdominal aortic aneurysm, without rupture, unspecified: Secondary | ICD-10-CM | POA: Diagnosis not present

## 2023-01-05 MED ORDER — IOPAMIDOL (ISOVUE-370) INJECTION 76%
80.0000 mL | Freq: Once | INTRAVENOUS | Status: AC | PRN
  Administered 2023-01-05: 80 mL via INTRAVENOUS

## 2023-01-09 ENCOUNTER — Ambulatory Visit: Payer: Medicare Other | Admitting: Physical Therapy

## 2023-01-09 ENCOUNTER — Ambulatory Visit: Payer: Medicare Other | Admitting: Speech Pathology

## 2023-01-09 ENCOUNTER — Ambulatory Visit: Payer: Medicare Other | Admitting: Occupational Therapy

## 2023-01-09 DIAGNOSIS — K080 Exfoliation of teeth due to systemic causes: Secondary | ICD-10-CM | POA: Diagnosis not present

## 2023-01-09 NOTE — Therapy (Incomplete)
OUTPATIENT PHYSICAL THERAPY NEURO TREATMENT   Patient Name: James Estrada MRN: [id number] DOB:21-May-1941, 82 y.o., male Today's Date: 01/09/2023   PCP: [name], MD REFERRING PROVIDER: Jolaine Artist, MD  END OF SESSION:    Past Medical History:  Diagnosis Date   AAA (abdominal aortic aneurysm) ([place])    last u/s done 07/18/17    Aneurysm of left internal iliac artery ([place]) 05/22/2022   Arthritis    Bradycardia    CVA (cerebral vascular accident) ([place]) 10/04/2022   Dysrhythmia    frequent PAC, for 20 years   GERD (gastroesophageal reflux disease)    occ, OTC   Hemorrhoids    History of hiatal hernia    Hyperlipidemia    Hypertension    S/P aortic dissection repair 09/18/2022   Seasonal allergies    Type 1 dissection of thoracic aorta ([place]) 10/26/2022   Past Surgical History:  Procedure Laterality Date   ABDOMINAL AORTIC ENDOVASCULAR STENT GRAFT N/A 05/04/2020   Procedure: ABDOMINAL AORTIC ENDOVASCULAR STENT GRAFT;  Surgeon: [name], MD;  Location: [place];  Service: Vascular;  Laterality: N/A;   CATARACT EXTRACTION Bilateral 2009   DIAGNOSTIC LAPAROSCOPY     laparoscopic hernia repair   EYE SURGERY Bilateral    cataract removal   HERNIA REPAIR Bilateral 1999, 2006   JOINT REPLACEMENT Right ~2018   hip replacement   pheochromocytoma  1993   PROSTATECTOMY N/A 05/15/2013   Procedure: PROSTATECTOMY RETROPUBIC; SIMPLE OPEN PROSTATECTOMY;  Surgeon: [name], MD;  Location: [facility];  Service: Urology;  Laterality: N/A;   REPAIR OF ACUTE ASCENDING THORACIC AORTIC DISSECTION N/A 09/17/2022   Procedure: REPAIR OF ACUTE ASCENDING THORACIC AORTIC DISSECTION USING 28 MM HEMASHIELD PLATINUM VASCULAR GRAFT;  Surgeon: [name], MD;  Location: [place];  Service: Vascular;  Laterality: N/A;  Median sternotomy   TOTAL ELBOW REPLACEMENT Left    > 30 years ago   TOTAL HIP ARTHROPLASTY Right 09/08/2017   Procedure: RIGHT TOTAL HIP ARTHROPLASTY ANTERIOR  APPROACH;  Surgeon: [name], MD;  Location: [facility];  Service: Orthopedics;  Laterality: Right;   ULTRASOUND GUIDANCE FOR VASCULAR ACCESS Bilateral 05/04/2020   Procedure: ULTRASOUND GUIDANCE FOR VASCULAR ACCESS;  Surgeon: [name], MD;  Location[facility];  Service: Vascular;  Laterality: Bilateral;   Patient Active Problem List   Diagnosis Date Noted   Type 1 dissection of thoracic aorta ([place]) 10/26/2022   CVA (cerebral vascular accident) ([place]) 10/04/2022   Sepsis with acute renal failure without septic shock ([place]) 09/30/2022   S/P aortic dissection repair 09/18/2022   Status post surgery 09/17/2022   Aneurysm of left internal iliac artery ([place]) 05/22/2022   Colon cancer screening 05/22/2022   Essential hypertension 05/22/2022   Hemorrhoids 05/22/2022   Inguinal hernia 05/22/2022   Irritable bowel syndrome 05/22/2022   Mixed hyperlipidemia 05/22/2022   Pure hypercholesterolemia 05/22/2022   Thrombocytopenia ([place]) 05/22/2022   Allergic rhinitis 05/22/2022   AAA (abdominal aortic aneurysm) without rupture ([place]) 05/04/2020   Trochanteric bursitis, right hip 08/20/2018   History of right hip replacement 08/20/2018   Status post total replacement of right hip 09/08/2017   Pain of right hip joint 08/07/2017   Unilateral primary osteoarthritis, right hip 08/07/2017   BPH (benign prostatic hypertrophy) with urinary obstruction 05/15/2013    ONSET DATE: 12/28/2022 (CVA on 09/17/22)  REFERRING DIAG: I63.19 (ICD-10-CM) - Cerebrovascular accident (CVA) due to embolism of other precerebral artery ([place])  THERAPY DIAG:  No diagnosis found.  Rationale for Evaluation and Treatment: Rehabilitation  SUBJECTIVE:                                                                                                                                                                                             SUBJECTIVE STATEMENT: ***   Pt accompanied by:  Wife, James Estrada   PERTINENT  HISTORY:  Pt is a 82 y/o M admitted to [facility] on 09/17/22 with type 1 aortic dissection requiring emergent repair. Head CT 11/6 acute infarcts within R MCA involvement, including R cerebellum, basal ganglia, frontal lobe. ETT 11/4-11/9. PMH AAA s/p endovascular repair (2020), HTN, HLD, GERD. Hosoitalized 09/18/23 to 10/21/23.Had [place], PT, SLP.    PAIN:  Are you having pain? No  PRECAUTIONS: Fall  WEIGHT BEARING RESTRICTIONS: No  FALLS: Has patient fallen in last 6 months? Yes. Number of falls 1  LIVING ENVIRONMENT: Lives with: lives with their family Lives in: House/apartment (lives at daughters house-will be moving to [place] in April/May to single home).  Stairs: Yes: Internal: flight but doesn't use steps; on left going up and External: 2 steps; none Has following equipment at home: Single point cane, shower chair, Grab bars, and tub/shower   PLOF: Independent  PATIENT GOALS: "Speech is my biggest goal.  I have a very difficult time finding words. Also to be more balanced."   OBJECTIVE:   DIAGNOSTIC FINDINGS: CT head performed 11/6 revealed 3 small right-sided strokes 1 in the cerebellum and 1 in the right basal ganglia as well as 1 in the frontal lobe with a 4 mm area of bleeding.  CTA without large vessel occlusion.  He exhibited left-sided weakness.  EEG performed.  Hyperglycemia treated and resolved.  COGNITION: Overall cognitive status: Within functional limits for tasks assessed   SENSATION: WFL  COORDINATION: WFL   POSTURE: No Significant postural limitations  LOWER EXTREMITY ROM:      Right Eval Left Eval  Hip flexion    Hip extension    Hip abduction    Hip adduction    Hip internal rotation    Hip external rotation    Knee flexion    Knee extension    Ankle dorsiflexion    Ankle plantarflexion    Ankle inversion    Ankle eversion     (Blank rows = not tested) All grossly WFL from seated position   LOWER EXTREMITY MMT:    MMT Right Eval Left Eval   Hip flexion 4/5 3+/5  Hip extension    Hip abduction 4/5 4/5  Hip adduction 4/5 3+/5  Hip internal rotation    Hip external rotation  Knee flexion 5/5 5/5  Knee extension 5/5 4/5  Ankle dorsiflexion 4/5 3+/5  Ankle plantarflexion    Ankle inversion    Ankle eversion    (Blank rows = not tested) all tested in seated position     TRANSFERS: Assistive device utilized: None  Sit to stand: SBA Stand to sit: Modified independence Chair to chair: SBA Floor:  n/a   STAIRS: Level of Assistance: SBA Stair Negotiation Technique: Step to Pattern Alternating Pattern  Forwards with Single Rail on Right Bilateral Rails Number of Stairs: 4 x 2 reps   Height of Stairs: 6  Comments: He initially performed stairs in step to pattern leading with weaker leg.  PT asked if he could perform in alternating pattern and he was able to do so without difficulty with single hand rail.   GAIT: Gait pattern: step through pattern, Left foot flat, and lateral hip instability Distance walked: 300' throughout session and FGA assessment  Assistive device utilized: None Level of assistance: SBA and CGA Comments: Performed all testing and gait in session without cane as he ambulates at home without cane.  Requires  up to CGA for higher level balance tasks.  Continue to recommend use of cane in community.    FUNCTIONAL TESTS:  10 meter walk test: 10.38 secs=3.16 ft/sec  Functional gait assessment: 19/30        TODAY'S TREATMENT:                                                                                                                              ***   PATIENT EDUCATION: Education details: Evaluation results, goals, POC and follow up *** Person educated: Patient and Spouse Education method: Explanation and Verbal cues Education comprehension: verbalized understanding and needs further education  HOME EXERCISE PROGRAM: None initiated today  ***  GOALS: Goals reviewed with patient?  Yes  SHORT TERM GOALS: Target date: 02/02/23  Pt will be IND with initial HEP in order to indicate improved functional mobility and dec fall risk. Baseline: Goal status: INITIAL  2.  Pt will ambulate 150' without AD mod I level in order to indicate more independent household ambulation.   Baseline:  Goal status: INITIAL  3.  Pt will improve FGA to >/=23/30 in order to indicate dec fall risk.  Baseline: 19/30 Goal status: INITIAL  4.  Pt will maintain gait speed to >/=3.0 ft/sec w/o AD while scanning environment without overt LOB in order to indicate dec fall risk.  Baseline:  Goal status: INITIAL  5.  Will assess MCTSIB and update goals to reflect progress.  Baseline:  Goal status: INITIAL  6.  Pt will negotiate up/down 4 steps with single rail in alt pattern, up/down ramp and curb and ambulate x 500' outdoors over unlevel paved surfaces at S level without AD in order to indicate improved community mobility.   Baseline:  Goal status: INITIAL  LONG TERM GOALS: Target date: 03/05/23  Pt will  be IND with final HEP in order to indicate improved functional mobility and dec fall risk. Baseline:  Goal status: INITIAL  2.  Pt will improve FGA to >/=26/30 in order to indicate dec fall risk.   Baseline: 19/30 Goal status: INITIAL  3.  Will add LTG for MTCSIB once assessed.  Baseline:  Goal status: INITIAL  4.  Pt will negotiate up/down 8 steps with single rail, up/down ramp and curb and ambulate x 1000' outdoors over unlevel paved surfaces without AD at mod I level while scanning environment in order to indicate improved community mobility.   Baseline:  Goal status: INITIAL    ASSESSMENT:  CLINICAL IMPRESSION: Emphasis of skilled PT session*** Continue POC.   OBJECTIVE IMPAIRMENTS: Abnormal gait, cardiopulmonary status limiting activity, decreased activity tolerance, decreased balance, decreased endurance, decreased mobility, decreased strength, and impaired perceived  functional ability.   ACTIVITY LIMITATIONS: carrying, lifting, standing, squatting, stairs, bathing, and locomotion level  PARTICIPATION LIMITATIONS: meal prep, medication management, personal finances, driving, shopping, community activity, and yard work  PERSONAL FACTORS: Age and 3+ comorbidities: see above  are also affecting patient's functional outcome.   REHAB POTENTIAL: Good  CLINICAL DECISION MAKING: Evolving/moderate complexity  EVALUATION COMPLEXITY: Moderate  PLAN:  PT FREQUENCY: 2x/week  PT DURATION: 8 weeks  PLANNED INTERVENTIONS: Therapeutic exercises, Therapeutic activity, Neuromuscular re-education, Balance training, Gait training, Patient/Family education, Self Care, and Joint mobilization  PLAN FOR NEXT SESSION: assess MCTSIB and update STG/LTG, initiate HEP for LLE strengthening and balance based on testing results, gait without device, outdoor gait, gait while scanning environment, dual tasking ****    Excell Seltzer, PT, DPT, CSRS***  [facility] [address] [place], [place], [address] Phone: [phone]   Fax:  [phone] 01/09/23, 9:39 AM

## 2023-01-10 ENCOUNTER — Encounter: Payer: Self-pay | Admitting: Thoracic Surgery (Cardiothoracic Vascular Surgery)

## 2023-01-10 ENCOUNTER — Ambulatory Visit (INDEPENDENT_AMBULATORY_CARE_PROVIDER_SITE_OTHER): Payer: Medicare Other | Admitting: Thoracic Surgery (Cardiothoracic Vascular Surgery)

## 2023-01-10 VITALS — BP 114/75 | HR 63 | Resp 20 | Ht 72.0 in | Wt 180.0 lb

## 2023-01-10 DIAGNOSIS — Z9889 Other specified postprocedural states: Secondary | ICD-10-CM

## 2023-01-10 NOTE — Progress Notes (Signed)
James Estrada       James Estrada,James Estrada [id number]             [phone]    HPI: Mr. Folks returns for follow-up after repair of a type I aortic dissection.  James Estrada is a 82 year old man with a history of hypertension, hyperlipidemia, bradycardia, PACs, abdominal aortic aneurysm, and recent type I aortic dissection.  He presented on 09/17/2022 with chest pain, syncope and bilateral leg weakness.  He underwent emergent repair of a type I aortic dissection with retrograde cerebral perfusion.  Postoperatively he was noted to have a stroke with a left hemiparesthesias and aphasia.  He also had atrial fibrillation.  He was treated for presumed pneumonia as well.  He went to inpatient rehab on 10/04/2022.  He was discharged from there on 10/20/2022.  I saw him in the office in December.  He was doing quite well.  In the interim since that visit he continues to work with PT and OT.  He saw Dr. Haroldine Laws and his amiodarone was discontinued.  He feels like he is making progress.  He does still have some expressive aphasia but it has improved over time.  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     aspirin 81 MG chewable tablet Chew 1 tablet (81 mg total) by mouth daily. 30 tablet 0    atorvastatin (LIPITOR) 40 MG tablet Take 1 tablet (40 mg total) by mouth daily. 30 tablet 0   citalopram (CELEXA) 10 MG tablet Take 1 tablet (10 mg total) by mouth daily. 30 tablet 0   doxazosin (CARDURA) 1 MG tablet Take 1 tablet (1 mg total) by mouth at bedtime. 30 tablet 0   metoprolol tartrate (LOPRESSOR) 25 MG tablet Take 0.5 tablets (12.5 mg total) by mouth 2 (two) times daily. 15 tablet 0   senna-docusate (SENOKOT-S) 8.6-50 MG tablet Take 1 tablet by mouth at bedtime as needed for mild constipation. 30 tablet 0   tamsulosin (FLOMAX) 0.4 MG CAPS capsule Take 0.4 mg by mouth at bedtime.     No current facility-administered medications for this visit.    Physical Exam BP 114/75 (BP Location: Right Arm)   Pulse 63   Resp 20   Ht 6' (1.829 m)   Wt 180 lb (81.6 kg)   SpO2 96% Comment: RA  BMI 24.24 kg/m  82 year old man in no acute distress Alert and oriented x 3 with no focal deficits Lungs clear with equal breath sounds bilaterally Cardiac regular rate and rhythm with a faint systolic murmur, no diastolic murmur Sternum stable Pulses intact No peripheral edema  Diagnostic Tests: CT ANGIOGRAPHY CHEST, ABDOMEN AND PELVIS  TECHNIQUE: Non-contrast CT of the chest was initially obtained.   Multidetector CT imaging through the chest, abdomen and pelvis was performed using the standard protocol during bolus administration of intravenous contrast. Multiplanar reconstructed images and MIPs were obtained and reviewed to evaluate the vascular anatomy.   RADIATION DOSE REDUCTION: This exam was performed according to the departmental dose-optimization program which includes automated exposure control, adjustment of the mA and/or kV according to patient size and/or use of iterative reconstruction technique.   CONTRAST:  41m ISOVUE-370 IOPAMIDOL (ISOVUE-370) INJECTION 76%   COMPARISON:  CT angiogram chest 09/17/2022. CT angiogram abdomen 07/21/22. Multiple older examinations as  well.   FINDINGS: CTA CHEST FINDINGS   Cardiovascular: There has been surgical repair with graft along the ascending aorta for previous dissection, Stanford type 1. Residual short segment dissection flap as on prior does extend into the brachiocephalic artery, proximal subclavian artery and proximal right common carotid. Density of the contrast in the true and false lumen is similar. More proximal such as axial image 67 of series 6 near the origin of the brachiocephalic artery is a curvilinear area of increased density tracking around the ascending aorta. This could be high density related to the graft. A small amount of residual flow extending into the false lumen is in the differential but felt to be less likely based on surgical history. The current study did not have a noncontrast exam of the chest to assess for high density surgical material.   No dissection or aneurysm of the arch or descending thoracic aorta. Diameter of the ascending aorta level of the right pulmonary artery measures 3.3 by 3.3 cm. The descending thoracic aorta same level 2.9 by 2.9 cm. Diameter of the arch approaches 3.1 cm. Diameter of the aortic root on coronal imaging has diameter of 3 cm. The origin of the left common carotid, left vertebral, right vertebral on the left subclavian artery are preserved. Mild prominence of the central pulmonary arteries. Please correlate for any evidence of pulmonary artery hypertension. Coronary artery calcifications are seen.   Mediastinum/Nodes: No specific abnormal lymph node enlargement identified in the axillary region, hilum or mediastinum. Small hiatal hernia.   Lungs/Pleura: Bilateral apical pleural thickening. No pneumothorax, effusion or edema. There is some breathing motion. Basilar scar or atelectasis is identified. There are subpleural nodule right James lobe laterally on series 6, image 137 measures 6 mm. This is unchanged going back to November 2020  consistent with long-term stability no additional follow-up recommended.   Musculoskeletal: Mild degenerative changes seen along the spine.   Review of the MIP images confirms the above findings.   CTA ABDOMEN AND PELVIS FINDINGS   VASCULAR   Aorta: Aortic endograft in place. The proximal aspect of the graft originates just below the origins of the renal arteries. The graft lumen is intact and patent. Distal aspect of the graft extending to the common iliac vessels. Diameter of the abdominal aortic aneurysm head measured up to 3.4 cm on the previous examination and today 3.5 cm, unchanged. On the 10 minute delayed dataset there is some increased density or enhancement along the aneurysm outside the graft posteriorly such as series 7, image 30 consistent with a type 2 endoleak. In addition there continues to be dilatation of the common iliac arteries bilaterally on the left measuring 2.7 cm and right 2.6 cm. Along both common iliac vessels at the tips of the end of the graft there is incomplete extension of the graft to the edge of the vessel  as this is in the area of aneurysm with a small amount of contrast enhancement extending between the distal margin of the stent in the wall of the vessel. There is also separate aneurysms of the internal iliac vessels with significant plaque and thrombus measuring up to 3.6 by 3.6 cm on the left and 2.7 by 3.2 cm on the right. The external iliac vessels are patent with mild plaque.   Celiac: Patent without evidence of aneurysm, dissection, vasculitis or significant stenosis.   SMA: Patent without evidence of aneurysm, dissection, vasculitis or significant stenosis.   Renals: Both renal arteries are patent without evidence of aneurysm, dissection, vasculitis, fibromuscular dysplasia or significant stenosis.   IMA: Occluded at its origin with reconstitution   Inflow: See above   Veins: No obvious venous abnormality within the limitations  of this arterial phase study.   Review of the MIP images confirms the above findings.   NON-VASCULAR   Hepatobiliary: In the left lobe lateral segment near the margin of the falciform ligament is a nodular area of enhancement measuring 10 by 6 mm on series 6, image 160. This has been present going back to older examinations. This could represent atypical flash fill hemangioma versus a true vascular aneurysm. Again relatively stable recommend simple attention on follow-up. Separate benign-appearing hepatic cysts. Gallbladder is nondilated.   Pancreas: Stable ectasia of the pancreatic duct towards the head of up to 5 mm. No pancreatic mass or focal atrophy.   Spleen: Normal in size without focal abnormality.   Adrenals/Urinary Tract: Adrenal glands are preserved. Mild bilateral renal atrophy. No collecting system dilatation. The ureters have normal course and caliber down to the bladder. Diffuse wall thickening of the urinary bladder.   Stomach/Bowel: On this non oral contrast exam, large bowel has a normal course and caliber with scattered stool. Moderate stool. Few diverticula. The stomach and small bowel are nondilated. Small hiatal hernia.   Lymphatic: No developing lymph node enlargement identified in the abdomen and pelvis.   Reproductive: Large prostate.   Other: Again surgical clips in the left upper quadrant mesentery. No obstruction, free air or free fluid. Hernia mesh seen along the right inguinal region.   Musculoskeletal: Streak artifact related to patient's right hip arthroplasty. Scattered degenerative changes of the spine and pelvis.   Review of the MIP images confirms the above findings.   Critical Value/emergent results were called by telephone at the time of interpretation on 01/05/2023 at 8:44 am [place] standard time to provider [facility] , who verbally acknowledged these results.   IMPRESSION: 1. Status post surgical repair of Stanford  type A aortic dissection. Residual dissection flap does extend into the brachiocephalic artery, proximal subclavian artery and proximal right common carotid artery for a short distance. More proximal near the origin of the brachiocephalic artery is a curvilinear area of increased density tracking around the ascending aorta. This could be high density related to the graft material. A small amount of focal residual flow extending into the false lumen is in the differential but felt to be less likely with the surgical history. Of note today's study does not come within noncontrast dataset of the chest. In the future a noncontrast chest as part of follow up may be useful. This also can be performed after clearance of the current contrast administration in 48 hours. 2. Stable abdominal aortic aneurysm measuring up to 3.5 cm. Once again there is a type 2 endoleak involving the posterior aspect of the aneurysm sac 3. Type  2 endoleak along the posterior aspect of the aneurysm sac. 4. Stable bilateral common iliac artery aneurysms. Of note the distal margins of the endograft along the iliac vessels is at the level of the aneurysms and there is a small amount of contrast extending between the edge of the graft in the vessel wall bilaterally. 5. Stable bilateral internal iliac artery aneurysms. 6. Stable 10 by 6 mm nodular area of enhancement in the left lobe of the liver near the margin of the falciform ligament. This could represent atypical flash fill hemangioma versus a true vascular aneurysm. Recommend continued follow-up 7. Large prostate with diffuse wall thickening of the urinary bladder. 8. Small hiatal hernia.     Electronically Signed   By: Jill Side M.D.   On: 01/05/2023 12:14 I personally reviewed the CT images.  Status post repair of a type I aortic dissection with previous stent grafting of the abdominal aorta.  Residual dissection flaps noted in carotids.  Overall good  result.  Impression: Hiren Vitucci is a 82 year old man with a history of hypertension, hyperlipidemia, bradycardia, PACs, abdominal aortic aneurysm, and recent type I aortic dissection.  Type I aortic dissection-I repaired back in November.  Complicated by multiple small strokes with expressive aphasia and left hemiparesis.  Also atrial fibrillation and hospital-acquired pneumonia.  CVA-continues to improve.  Does still have some expressive aphasia but moves well.  Continues to work with PT, OT, and speech.  Postoperative atrial fibrillation-resolved.  Amiodarone was discontinued.  From a surgical standpoint there are no restrictions on his activities at this point.  I did advise him to avoid lifting anything over 50 pounds.  No restrictions on aerobic type activities.  Hypertension-blood pressure well-controlled  Abdominal aortic aneurysm status post stent grafting by Dr. Donnetta Hutching in the past.  Followed by Dr. Stanford Breed.  Plan: I will plan to see him back in about 8 months with a CT angio of the chest for 1 year follow-up.  [name], MD [facility] [phone]

## 2023-01-11 ENCOUNTER — Ambulatory Visit: Payer: Medicare Other | Admitting: Speech Pathology

## 2023-01-11 ENCOUNTER — Ambulatory Visit: Payer: Medicare Other | Admitting: Physical Therapy

## 2023-01-11 VITALS — BP 119/75 | HR 62

## 2023-01-11 DIAGNOSIS — R41841 Cognitive communication deficit: Secondary | ICD-10-CM | POA: Diagnosis not present

## 2023-01-11 DIAGNOSIS — R4701 Aphasia: Secondary | ICD-10-CM | POA: Diagnosis not present

## 2023-01-11 DIAGNOSIS — R2689 Other abnormalities of gait and mobility: Secondary | ICD-10-CM

## 2023-01-11 DIAGNOSIS — R2681 Unsteadiness on feet: Secondary | ICD-10-CM

## 2023-01-11 DIAGNOSIS — I69352 Hemiplegia and hemiparesis following cerebral infarction affecting left dominant side: Secondary | ICD-10-CM | POA: Diagnosis not present

## 2023-01-11 NOTE — Patient Instructions (Addendum)
   Do the homework, then later or the next day go back and check it, then see if you can find or fix your errors  When you are practicing naming in categories (restaurants, MD's, presidents, golfers, football teams, places you've traveled to etc) - write them in a list when you are able. The goal for this would be 10.

## 2023-01-11 NOTE — Therapy (Signed)
OUTPATIENT SPEECH LANGUAGE PATHOLOGY TREATMENT NOTE   Patient Name: James E[facility]ada MRN: [id number] DOB:Apr 11, 1941, 82 y.o., male Today's Date: 01/11/2023  PCP: [name], MD REFERRING PROVIDER: [name], MD  END OF SESSION:   End of Session - 01/11/23 1015     Visit Number 5    Number of Visits 25    Date for SLP Re-Evaluation 03/20/23    SLP Start Time 1015    SLP Stop Time  1100    SLP Time Calculation (min) 45 min    Activity Tolerance Patient tolerated treatment well              Past Medical History:  Diagnosis Date   AAA (abdominal aortic aneurysm) ([place])    last u/s done 07/18/17    Aneurysm of left internal iliac artery ([place]) 05/22/2022   Arthritis    Bradycardia    CVA (cerebral vascular accident) ([place]) 10/04/2022   Dysrhythmia    frequent PAC, for 20 years   GERD (gastroesophageal reflux disease)    occ, OTC   Hemorrhoids    History of hiatal hernia    Hyperlipidemia    Hypertension    S/P aortic dissection repair 09/18/2022   Seasonal allergies    Type 1 dissection of thoracic aorta ([place]) 10/26/2022   Past Surgical History:  Procedure Laterality Date   ABDOMINAL AORTIC ENDOVASCULAR STENT GRAFT N/A 05/04/2020   Procedure: ABDOMINAL AORTIC ENDOVASCULAR STENT GRAFT;  Surgeon: [name], MD;  Location: [facility];  Service: Vascular;  Laterality: N/A;   CATARACT EXTRACTION Bilateral 2009   DIAGNOSTIC LAPAROSCOPY     laparoscopic hernia repair   EYE SURGERY Bilateral    cataract removal   HERNIA REPAIR Bilateral 1999, 2006   JOINT REPLACEMENT Right ~2018   hip replacement   pheochromocytoma  1993   PROSTATECTOMY N/A 05/15/2013   Procedure: PROSTATECTOMY RETROPUBIC; SIMPLE OPEN PROSTATECTOMY;  Surgeon: [name], MD;  Location: [facility];  Service: Urology;  Laterality: N/A;   REPAIR OF ACUTE ASCENDING THORACIC AORTIC DISSECTION N/A 09/17/2022   Procedure: REPAIR OF ACUTE ASCENDING THORACIC AORTIC DISSECTION USING 28 MM HEMASHIELD PLATINUM  VASCULAR GRAFT;  Surgeon: [name], MD;  Location: [facility];  Service: Vascular;  Laterality: N/A;  Median sternotomy   TOTAL ELBOW REPLACEMENT Left    > 30 years ago   TOTAL HIP ARTHROPLASTY Right 09/08/2017   Procedure: RIGHT TOTAL HIP ARTHROPLASTY ANTERIOR APPROACH;  Surgeon: [name], MD;  Location: [facility];  Service: Orthopedics;  Laterality: Right;   ULTRASOUND GUIDANCE FOR VASCULAR ACCESS Bilateral 05/04/2020   Procedure: ULTRASOUND GUIDANCE FOR VASCULAR ACCESS;  Surgeon: [name], MD;  Location: [facility];  Service: Vascular;  Laterality: Bilateral;   Patient Active Problem List   Diagnosis Date Noted   Type 1 dissection of thoracic aorta ([place]) 10/26/2022   CVA (cerebral vascular accident) ([place]) 10/04/2022   Sepsis with acute renal failure without septic shock ([place]) 09/30/2022   S/P aortic dissection repair 09/18/2022   Status post surgery 09/17/2022   Aneurysm of left internal iliac artery ([place]) 05/22/2022   Colon cancer screening 05/22/2022   Essential hypertension 05/22/2022   Hemorrhoids 05/22/2022   Inguinal hernia 05/22/2022   Irritable bowel syndrome 05/22/2022   Mixed hyperlipidemia 05/22/2022   Pure hypercholesterolemia 05/22/2022   Thrombocytopenia ([place]) 05/22/2022   Allergic rhinitis 05/22/2022   AAA (abdominal aortic aneurysm) without rupture ([place]) 05/04/2020   Trochanteric bursitis, right hip 08/20/2018   History of right  hip replacement 08/20/2018   Status post total replacement of right hip 09/08/2017   Pain of right hip joint 08/07/2017   Unilateral primary osteoarthritis, right hip 08/07/2017   BPH (benign prostatic hypertrophy) with urinary obstruction 05/15/2013    ONSET DATE: 09/17/22  REFERRING DIAG: R13.10 (ICD-10-CM) - Dysphagia, unspecified  THERAPY DIAG:  Aphasia  Rationale for Evaluation and Treatment: Rehabilitation  SUBJECTIVE:   SUBJECTIVE STATEMENT: "the dentist took 3 hours, so we missed"  PAIN:  Are you  having pain? No    OBJECTIVE:  Pt is a 82 y/o M admitted to [facility] on 09/17/22 with type 1 aortic dissection requiring emergent repair. Head CT 11/6 acute infarcts within R MCA involvement, including R cerebellum, basal ganglia, frontal lobe. ETT 11/4-11/9. PMH AAA s/p endovascular repair (2020), HTN, HLD, GERD.   TODAY'S TREATMENT:                                                                                                                                         DATE:    01/11/23: [facility] reports difficulty writing and copying (homework was matching and copying sentence beginning and endings) Targeted written expression and word finding in divergent naming task - restaurants they frequent. James [facility]ada generated 13 restaurants with usual min to [employer]. [facility] required consistent mod to max   A to ID and correct errors in written words with consistent mod verbal and written cues. In narrative discourse telling how he met his wife, [facility] required usual questioning cues for clarification and verbal instructions for cohesion. After initial instructions, Shaheed ID'd areas where he lacked cohesion. With usual verbal cues he used verbal compensations for word finding episodes.  01/04/23: To target sentence generation, word finding and reading comprehension  Pharmacist, hospital ([facility]) was utilized. The pt generated 3 subjects and objects for 3 verbs (cast, operate, decorate), for a total of 9 subject objects. Pt required usual mod semantic and questioning cues cues. Pt generated 3 complex sentences by answering "wh" questions. Pt required occasional min semantic cues to generate complex sentences. Targeted written expression and divergent naming in personally relevant category (army) he generated 9 army terms, Id'd written errors, however required mod A to self correct. They continue to complete all homework consistently. Provided information on [employer] and [employer] news with  demonstration   01/02/23: Targeted verbal compensations for aphasia, initially using Semantic Feature Analysis chart fading to structured task generating 3 salient descriptions for basic objects, progressing from photo to written word with occasional min questioning cues and semantic cues. Targeted word finding and auditory comprehension in convergent naming task. [facility] named 10 items with rare repetition and required semantic cues and 1st letter cues for 3/10. Targeted divergent naming in personally relevant category (stock market) - James E[facility]ada named 15 words with extended time, usual mod semantic and questioning cues. Generated strategy to use written notes to make  phone calls as needed (see patient instructions)  12/28/22:  Aphasia ID card provided.  To target word finding, sentence generation, reading comrehension  Pharmacist, hospital ([facility]) was utilized. The pt generated 3 subjects and objects for 3 verbs (drive, measure, kick), for a total of 9 subject objects. Pt required extended time and usual min to mod semantic and phonemic  cues. Pt generated 3 complex sentences by answering "wh" questions. Pt required usual min semantic and written  cues to generate complex sentences and comprehend "wh" questions. Divergent naming targeted in personally relevant category, golf, James Estrada generated 15 words with extended time, usual min semantic cues. With questioning cues and instruction to "tell me about this" James [facility]ada successfully used verbal compensations to communicate a favorite restaurant. Targeted reading comprehension and phone access - [facility] required extended time, visual cues to locate and select text message icon. He read 3 sentence text with questioning cues for accurate comprehension of text.   12/26/22 (eval day): initiated education re: word finding activities to do at home, environmental compensations to maximize communication, family behaviors to support communication     PATIENT  EDUCATION: Education details: See patient instructions, see today's treatment, compensations for aphasia Person educated: Patient and Spouse Education method: Explanation, Verbal cues, and Handouts Education comprehension: verbal cues required and needs further education     GOALS: Goals reviewed with patient? Yes   SHORT TERM GOALS: Target date: 01/23/23   Pt will name 10 items in personally relevant categories over 3 sessions with occasional min A Baseline: Goal status: ONGOING   2.  Pt will write 4 item grocery list with occasional min A Baseline:  Goal status: ONGOING   3.  Pt will respond to 3 texts with occasional min A over 1 week Baseline:  Goal status: ONGOING   4.  Pt will generate complex verbal sentences 3x using Pharmacist, hospital with rare min A over 2 sessions Baseline:  Goal status: ONGOING   5.  Pt will comprehend 4 sentence passage/email with extended time and rare min A Baseline:  Goal status: ONGOING   6.  Pt will employ compensations for aphasia in structured language tasks with occasional min A Baseline:  Goal status: ONGOING   LONG TERM GOALS: Target date: 03/20/23   Pt will complete complex naming tasks with 80% accuracy and occasional min A Baseline:  Goal status: ONGOING   2.  Pt will carryover verbal compensations for aphasia with occasional min A in conversation as needed over 2 sessions Baseline:  Goal status: ONGOING   3.  Pt will carryover 3 strategies to successfully communicate in community with clerks, servers etc Baseline:  Goal status: ONGOING   4.  Pt will write 3 sentence text or email with occasional min A, external aids and occasional min A  Baseline:  Goal status: ONGOING   5.  Pt will improve score on Communicative Participation Item Bank by 4 points Baseline: 14/30 Goal status: ONGOING       ASSESSMENT:   CLINICAL IMPRESSION: Patient is a 82 y.o. male who was seen today for aphasia s/p CVA. He is  accompanied by his spouse James Estrada. Prior to CVA, James Estrada was active golfing, managing finances,  keeping up with the stock market and socializing with friends. At this time, he has difficulty using his phone and remote and is not texting due to impaired written expression. James Estrada is managing his medications and appointments. He has difficulty reading numbers, letters and words. James Estrada reports he  is "quick to give up" when he encounters a communication breakdown. They plan to move into independent living at [facility] in the spring.  Ongoing training in compensations for aphasia and targeting written and verbal expression as well as reading comprehension. I recommend skilled ST to maximize verbal and written communication and cognition for safety, to reduce caregiver burden and QOL.    OBJECTIVE IMPAIRMENTS: include memory and aphasia. These impairments are limiting patient from managing medications, managing appointments, managing finances, household responsibilities, ADLs/IADLs, and effectively communicating at home and in community. Factors affecting potential to achieve goals and functional outcome are ability to learn/carryover information. Patient will benefit from skilled SLP services to address above impairments and improve overall function.   REHAB POTENTIAL: Good   PLAN:   SLP FREQUENCY: 2x/week   SLP DURATION: 12 weeks   PLANNED INTERVENTIONS: Language facilitation, Environmental controls, Cueing hierachy, Cognitive reorganization, Internal/external aids, Functional tasks, and Multimodal communication approach         Cindra Austad, Annye Rusk, CCC-SLP 01/11/2023, 10:15 AM

## 2023-01-11 NOTE — Therapy (Signed)
OUTPATIENT PHYSICAL THERAPY NEURO TREATMENT   Patient Name: James Estrada MRN: [id number] DOB:1941/03/15, 82 y.o., male Today's Date: 01/11/2023   PCP: James Carol, MD REFERRING PROVIDER: Jolaine Artist, MD  END OF SESSION:  PT End of Session - 01/11/23 0934     Visit Number 2    Number of Visits 17    Date for PT Re-Evaluation 03/05/23    Authorization Type BCBS Medicare (needs 10th visit PN)    Progress Note Due on Visit 10    PT Start Time 0932    PT Stop Time 1013    PT Time Calculation (min) 41 min    Equipment Utilized During Treatment Gait belt    Activity Tolerance Patient tolerated treatment well    Behavior During Therapy WFL for tasks assessed/performed             Past Medical History:  Diagnosis Date   AAA (abdominal aortic aneurysm) ([facility])    last u/s done 07/18/17    Aneurysm of left internal iliac artery ([facility]) 05/22/2022   Arthritis    Bradycardia    CVA (cerebral vascular accident) ([facility]) 10/04/2022   Dysrhythmia    frequent PAC, for 20 years   GERD (gastroesophageal reflux disease)    occ, OTC   Hemorrhoids    History of hiatal hernia    Hyperlipidemia    Hypertension    S/P aortic dissection repair 09/18/2022   Seasonal allergies    Type 1 dissection of thoracic aorta ([facility]) 10/26/2022   Past Surgical History:  Procedure Laterality Date   ABDOMINAL AORTIC ENDOVASCULAR STENT GRAFT N/A 05/04/2020   Procedure: ABDOMINAL AORTIC ENDOVASCULAR STENT GRAFT;  Surgeon: James Posner, MD;  Location: [facility];  Service: Vascular;  Laterality: N/A;   CATARACT EXTRACTION Bilateral 2009   DIAGNOSTIC LAPAROSCOPY     laparoscopic hernia repair   EYE SURGERY Bilateral    cataract removal   HERNIA REPAIR Bilateral 1999, 2006   JOINT REPLACEMENT Right ~2018   hip replacement   pheochromocytoma  1993   PROSTATECTOMY N/A 05/15/2013   Procedure: PROSTATECTOMY RETROPUBIC; SIMPLE OPEN PROSTATECTOMY;  Surgeon: James Amass, MD;  Location: [facility];  Service:  Urology;  Laterality: N/A;   REPAIR OF ACUTE ASCENDING THORACIC AORTIC DISSECTION N/A 09/17/2022   Procedure: REPAIR OF ACUTE ASCENDING THORACIC AORTIC DISSECTION USING 28 MM HEMASHIELD PLATINUM VASCULAR GRAFT;  Surgeon: James Nakayama, MD;  Location: [facility];  Service: Vascular;  Laterality: N/A;  Median sternotomy   TOTAL ELBOW REPLACEMENT Left    > 30 years ago   TOTAL HIP ARTHROPLASTY Right 09/08/2017   Procedure: RIGHT TOTAL HIP ARTHROPLASTY ANTERIOR APPROACH;  Surgeon: James Rossetti, MD;  Location: [facility];  Service: Orthopedics;  Laterality: Right;   ULTRASOUND GUIDANCE FOR VASCULAR ACCESS Bilateral 05/04/2020   Procedure: ULTRASOUND GUIDANCE FOR VASCULAR ACCESS;  Surgeon: [facility] Posner, MD;  Location: [facility];  Service: Vascular;  Laterality: Bilateral;   Patient Active Problem List   Diagnosis Date Noted   Type 1 dissection of thoracic aorta ([place]) 10/26/2022   CVA (cerebral vascular accident) ([place]) 10/04/2022   Sepsis with acute renal failure without septic shock ([place]) 09/30/2022   S/P aortic dissection repair 09/18/2022   Status post surgery 09/17/2022   Aneurysm of left internal iliac artery ([place]) 05/22/2022   Colon cancer screening 05/22/2022   Essential hypertension 05/22/2022   Hemorrhoids 05/22/2022   Inguinal hernia 05/22/2022   Irritable bowel syndrome 05/22/2022   Mixed hyperlipidemia  05/22/2022   Pure hypercholesterolemia 05/22/2022   Thrombocytopenia ([place]) 05/22/2022   Allergic rhinitis 05/22/2022   AAA (abdominal aortic aneurysm) without rupture ([place]) 05/04/2020   Trochanteric bursitis, right hip 08/20/2018   History of right hip replacement 08/20/2018   Status post total replacement of right hip 09/08/2017   Pain of right hip joint 08/07/2017   Unilateral primary osteoarthritis, right hip 08/07/2017   BPH (benign prostatic hypertrophy) with urinary obstruction 05/15/2013    ONSET DATE: 12/28/2022 (CVA on 09/17/22)  REFERRING DIAG: I63.19  (ICD-10-CM) - Cerebrovascular accident (CVA) due to embolism of other precerebral artery ([facility])  THERAPY DIAG:  Unsteadiness on feet  Other abnormalities of gait and mobility  Rationale for Evaluation and Treatment: Rehabilitation  SUBJECTIVE:                                                                                                                                                                                             SUBJECTIVE STATEMENT: Pt reports he is doing well, carried cane into clinic. Wants to return to the gym and golfing. No falls    Pt accompanied by:  Wife, James Estrada   PERTINENT HISTORY:  Pt is a 82 y/o M admitted to [facility] on 09/17/22 with type 1 aortic dissection requiring emergent repair. Head CT 11/6 acute infarcts within R MCA involvement, including R cerebellum, basal ganglia, frontal lobe. ETT 11/4-11/9. PMH AAA s/p endovascular repair (2020), HTN, HLD, GERD. Hosoitalized 09/18/23 to 10/21/23.Had [facility], PT, SLP.    PAIN:  Are you having pain? No  VITALS Vitals:   01/11/23 0941  BP: 119/75  Pulse: 62     PRECAUTIONS: Fall  WEIGHT BEARING RESTRICTIONS: No  FALLS: Has patient fallen in last 6 months? Yes. Number of falls 1  LIVING ENVIRONMENT: Lives with: lives with their family Lives in: House/apartment (lives at daughters house-will be moving to [place] in April/May to single home).  Stairs: Yes: Internal: flight but doesn't use steps; on left going up and External: 2 steps; none Has following equipment at home: Single point cane, shower chair, Grab bars, and tub/shower   PLOF: Independent  PATIENT GOALS: "Speech is my biggest goal.  I have a very difficult time finding words. Also to be more balanced."   OBJECTIVE:   DIAGNOSTIC FINDINGS: CT head performed 11/6 revealed 3 small right-sided strokes 1 in the cerebellum and 1 in the right basal ganglia as well as 1 in the frontal lobe with a 4 mm area of bleeding.  CTA without large vessel occlusion.   He exhibited left-sided weakness.  EEG performed.  Hyperglycemia treated and resolved.  COGNITION:  Overall cognitive status: Within functional limits for tasks assessed   SENSATION: WFL  COORDINATION: WFL   POSTURE: No Significant postural limitations    TODAY'S TREATMENT:    Ther Act MCTSIB:  Condition 1: Avg of 3 trials: 30 sec  Condition 2: Avg of 3 trials: 30 sec (minor A/P sway) Condition 3: Avg of 3 trials: 30 sec Condition 4: Avg of 3 trials: 30 sec (minor A/P sway and lateral lean to L) Total Score: 120/120  Ther Ex Elliptical level 1 for 4 minutes for improved BLE strength, endurance and return to PLOF. CGA while stepping onto/off of machine but pt had no difficulty. RPE of 8/10 following activity.  In // bars, standing on rockerboard in A/P direction for 2 minutes without UE support for improved ankle strategy, postural control and BLE strength. Pt assumed crouched position and relied heavily on hip strategy despite cues for upright posture. Pt would benefit from continued practice on unlevel surfaces to improve confidence and elicit ankle strategy.    GAIT: Gait pattern: step through pattern, Left foot flat, and lateral hip instability Distance walked: Various clinic distances  Assistive device utilized: None Level of assistance: SBA Comments: Pt carried cane into session but did not use it during session.  Noted cautious gait pattern but instability noted     PATIENT EDUCATION: Education details: MCTSIB results, encouragement to return to gym and start slowly building up to PLOF  Person educated: Patient and Spouse Education method: Explanation and Verbal cues Education comprehension: verbalized understanding and needs further education  HOME EXERCISE PROGRAM: None initiated today   GOALS: Goals reviewed with patient? Yes  SHORT TERM GOALS: Target date: 02/02/23  Pt will be IND with initial HEP in order to indicate improved functional mobility and dec  fall risk. Baseline: Goal status: INITIAL  2.  Pt will ambulate 150' without AD mod I level in order to indicate more independent household ambulation.   Baseline:  Goal status: INITIAL  3.  Pt will improve FGA to >/=23/30 in order to indicate dec fall risk.  Baseline: 19/30 Goal status: INITIAL  4.  Pt will maintain gait speed to >/=3.0 ft/sec w/o AD while scanning environment without overt LOB in order to indicate dec fall risk.  Baseline:  Goal status: INITIAL  5.  Will assess MCTSIB and update goals to reflect progress.  Baseline: Pt scored 120/120 at baseline  Goal status: MET  6.  Pt will negotiate up/down 4 steps with single rail in alt pattern, up/down ramp and curb and ambulate x 500' outdoors over unlevel paved surfaces at S level without AD in order to indicate improved community mobility.   Baseline:  Goal status: INITIAL  LONG TERM GOALS: Target date: 03/05/23  Pt will be IND with final HEP in order to indicate improved functional mobility and dec fall risk. Baseline:  Goal status: INITIAL  2.  Pt will improve FGA to >/=26/30 in order to indicate dec fall risk.   Baseline: 19/30 Goal status: INITIAL  3.  Will add LTG for MTCSIB once assessed.  Baseline: pt scored 120/120 at baseline Goal status: MET  4.  Pt will negotiate up/down 8 steps with single rail, up/down ramp and curb and ambulate x 1000' outdoors over unlevel paved surfaces without AD at mod I level while scanning environment in order to indicate improved community mobility.   Baseline:  Goal status: INITIAL    ASSESSMENT:  CLINICAL IMPRESSION: Emphasis of skilled PT session on assessing vestibular function,  global endurance and eliciting ankle strategy. Pt scored a 120/120 on MCTSIB, with only minor A/P sway noted throughout. Pt very encouraged to receive clearance and encouragement by MD and PT to return to gym, as he used to workout 7 days/week. Pt would benefit from structured return to  gym plan to ensure he eases back into routine slowly. Pt did have difficulty eliciting ankle strategy on rockerboard due to guarded behavior, so would benefit from continued practice as he is a golfer and frequently walks on unlevel surfaces. Continue POC.    OBJECTIVE IMPAIRMENTS: Abnormal gait, cardiopulmonary status limiting activity, decreased activity tolerance, decreased balance, decreased endurance, decreased mobility, decreased strength, and impaired perceived functional ability.   ACTIVITY LIMITATIONS: carrying, lifting, standing, squatting, stairs, bathing, and locomotion level  PARTICIPATION LIMITATIONS: meal prep, medication management, personal finances, driving, shopping, community activity, and yard work  PERSONAL FACTORS: Age and 3+ comorbidities: see above  are also affecting patient's functional outcome.   REHAB POTENTIAL: Good  CLINICAL DECISION MAKING: Evolving/moderate complexity  EVALUATION COMPLEXITY: Moderate  PLAN:  PT FREQUENCY: 2x/week  PT DURATION: 8 weeks  PLANNED INTERVENTIONS: Therapeutic exercises, Therapeutic activity, Neuromuscular re-education, Balance training, Gait training, Patient/Family education, Self Care, and Joint mobilization  PLAN FOR NEXT SESSION:  initiate HEP to include return to gym activities and maybe ankle strategy, gait without device, outdoor gait, gait while scanning environment, dual tasking, high-level balance  Cruzita Lederer Mahkai Fangman, PT, [place] [address] [place], [place]  [address] Phone:  [phone] Fax:  [phone] 01/11/23, 10:19 AM

## 2023-01-16 ENCOUNTER — Ambulatory Visit: Payer: Medicare Other | Attending: Internal Medicine | Admitting: Speech Pathology

## 2023-01-16 ENCOUNTER — Ambulatory Visit: Payer: Medicare Other | Admitting: Occupational Therapy

## 2023-01-16 ENCOUNTER — Encounter: Payer: Self-pay | Admitting: Physical Therapy

## 2023-01-16 ENCOUNTER — Encounter: Payer: Self-pay | Admitting: Occupational Therapy

## 2023-01-16 ENCOUNTER — Ambulatory Visit: Payer: Medicare Other | Admitting: Physical Therapy

## 2023-01-16 DIAGNOSIS — R2681 Unsteadiness on feet: Secondary | ICD-10-CM | POA: Diagnosis not present

## 2023-01-16 DIAGNOSIS — R29818 Other symptoms and signs involving the nervous system: Secondary | ICD-10-CM | POA: Diagnosis not present

## 2023-01-16 DIAGNOSIS — M6281 Muscle weakness (generalized): Secondary | ICD-10-CM

## 2023-01-16 DIAGNOSIS — I69352 Hemiplegia and hemiparesis following cerebral infarction affecting left dominant side: Secondary | ICD-10-CM

## 2023-01-16 DIAGNOSIS — R278 Other lack of coordination: Secondary | ICD-10-CM

## 2023-01-16 DIAGNOSIS — R2689 Other abnormalities of gait and mobility: Secondary | ICD-10-CM | POA: Diagnosis not present

## 2023-01-16 DIAGNOSIS — R41841 Cognitive communication deficit: Secondary | ICD-10-CM | POA: Insufficient documentation

## 2023-01-16 DIAGNOSIS — R41842 Visuospatial deficit: Secondary | ICD-10-CM

## 2023-01-16 DIAGNOSIS — R4701 Aphasia: Secondary | ICD-10-CM | POA: Insufficient documentation

## 2023-01-16 NOTE — Therapy (Signed)
OUTPATIENT PHYSICAL THERAPY NEURO TREATMENT   Patient Name: James Estrada MRN: [id number] DOB:1940/12/08, 82 y.o., male Today's Date: 01/16/2023   PCP: [name], MD REFERRING PROVIDER: Jolaine Artist, MD  END OF SESSION:  PT End of Session - 01/16/23 1538     Visit Number 3    Number of Visits 17    Date for PT Re-Evaluation 03/05/23    Authorization Type BCBS Medicare (needs 10th visit PN)    Progress Note Due on Visit 10    PT Start Time 1536   handoff from OT   PT Stop Time 3    PT Time Calculation (min) 39 min    Equipment Utilized During Treatment Gait belt    Activity Tolerance Patient tolerated treatment well    Behavior During Therapy [facility] for tasks assessed/performed             Past Medical History:  Diagnosis Date   AAA (abdominal aortic aneurysm) ([place])    last u/s done 07/18/17    Aneurysm of left internal iliac artery ([place]) 05/22/2022   Arthritis    Bradycardia    CVA (cerebral vascular accident) ([place]) 10/04/2022   Dysrhythmia    frequent PAC, for 20 years   GERD (gastroesophageal reflux disease)    occ, OTC   Hemorrhoids    History of hiatal hernia    Hyperlipidemia    Hypertension    S/P aortic dissection repair 09/18/2022   Seasonal allergies    Type 1 dissection of thoracic aorta ([place]) 10/26/2022   Past Surgical History:  Procedure Laterality Date   ABDOMINAL AORTIC ENDOVASCULAR STENT GRAFT N/A 05/04/2020   Procedure: ABDOMINAL AORTIC ENDOVASCULAR STENT GRAFT;  Surgeon: [name], MD;  Location: [place];  Service: Vascular;  Laterality: N/A;   CATARACT EXTRACTION Bilateral 2009   DIAGNOSTIC LAPAROSCOPY     laparoscopic hernia repair   EYE SURGERY Bilateral    cataract removal   HERNIA REPAIR Bilateral 1999, 2006   JOINT REPLACEMENT Right ~2018   hip replacement   pheochromocytoma  1993   PROSTATECTOMY N/A 05/15/2013   Procedure: PROSTATECTOMY RETROPUBIC; SIMPLE OPEN PROSTATECTOMY;  Surgeon: [name], MD;  Location:  [facility];  Service: Urology;  Laterality: N/A;   REPAIR OF ACUTE ASCENDING THORACIC AORTIC DISSECTION N/A 09/17/2022   Procedure: REPAIR OF ACUTE ASCENDING THORACIC AORTIC DISSECTION USING 28 MM HEMASHIELD PLATINUM VASCULAR GRAFT;  Surgeon: [name], MD;  Location: [place];  Service: Vascular;  Laterality: N/A;  Median sternotomy   TOTAL ELBOW REPLACEMENT Left    > 30 years ago   TOTAL HIP ARTHROPLASTY Right 09/08/2017   Procedure: RIGHT TOTAL HIP ARTHROPLASTY ANTERIOR APPROACH;  Surgeon: [name], MD;  Location: [facility];  Service: Orthopedics;  Laterality: Right;   ULTRASOUND GUIDANCE FOR VASCULAR ACCESS Bilateral 05/04/2020   Procedure: ULTRASOUND GUIDANCE FOR VASCULAR ACCESS;  Surgeon: [name], MD;  Location: [facility] OR;  Service: Vascular;  Laterality: Bilateral;   Patient Active Problem List   Diagnosis Date Noted   Type 1 dissection of thoracic aorta ([place]) 10/26/2022   CVA (cerebral vascular accident) ([place]) 10/04/2022   Sepsis with acute renal failure without septic shock ([place]) 09/30/2022   S/P aortic dissection repair 09/18/2022   Status post surgery 09/17/2022   Aneurysm of left internal iliac artery ([place]) 05/22/2022   Colon cancer screening 05/22/2022   Essential hypertension 05/22/2022   Hemorrhoids 05/22/2022   Inguinal hernia 05/22/2022   Irritable bowel syndrome 05/22/2022  Mixed hyperlipidemia 05/22/2022   Pure hypercholesterolemia 05/22/2022   Thrombocytopenia ([place]) 05/22/2022   Allergic rhinitis 05/22/2022   AAA (abdominal aortic aneurysm) without rupture ([place]) 05/04/2020   Trochanteric bursitis, right hip 08/20/2018   History of right hip replacement 08/20/2018   Status post total replacement of right hip 09/08/2017   Pain of right hip joint 08/07/2017   Unilateral primary osteoarthritis, right hip 08/07/2017   BPH (benign prostatic hypertrophy) with urinary obstruction 05/15/2013    ONSET DATE: 12/28/2022 (CVA on 09/17/22)  REFERRING DIAG:  I63.19 (ICD-10-CM) - Cerebrovascular accident (CVA) due to embolism of other precerebral artery ([place])  THERAPY DIAG:  Unsteadiness on feet  Other abnormalities of gait and mobility  Hemiplegia and hemiparesis following cerebral infarction affecting left dominant side ([place])  Rationale for Evaluation and Treatment: Rehabilitation  SUBJECTIVE:                                                                                                                                                                                             SUBJECTIVE STATEMENT: Pt reports he is doing well, received from OT evaluation.  Pt states he is having no pain, but some ongoing swelling in the left foot since the stroke.  He used to use free weights for shoulders and chest.  He did some seated LAQs for his legs.  He was using an elliptical.   Pt accompanied by:  Wife, James Estrada   PERTINENT HISTORY:  Pt is a 82 y/o M admitted to [facility] on 09/17/22 with type 1 aortic dissection requiring emergent repair. Head CT 11/6 acute infarcts within R MCA involvement, including R cerebellum, basal ganglia, frontal lobe. ETT 11/4-11/9. PMH AAA s/p endovascular repair (2020), HTN, HLD, GERD. Hosoitalized 09/18/23 to 10/21/23.Had [place], PT, SLP.    PAIN:  Are you having pain? No  VITALS There were no vitals filed for this visit.    PRECAUTIONS: Fall  OBJECTIVE:  TODAY'S TREATMENT:     Ther Ex SciFit multi-peaks level 3.0 x52mnutes for cardiovascular warmup and improved LE strength; chose SciFit over elliptical as pt was frustrated by limited time he could tolerate elliptical last session and wanted an activity he could do longer today.  Discussed continued trials of elliptical even w/ decreased tolerance and benefit to PLOF w/ pt in agreement. Standing hamstring curls x12 each LE w/ red resistance band  NMR: Forward T w/ contralateral UE support x3 each side, RLE more difficult using LLE as kickstand and shallow depth of  reach Corner balance:  feet together eyes closed x45 seconds, alt tandem w/ single fingertip support x45 seconds each   GAIT: Gait  pattern: step through pattern, Left foot flat, and wide BOS Distance walked: 400'  Assistive device utilized: None Level of assistance: SBA Comments:  Left inattention noted today.  Pt tends to drift heavily to left narrowly missing 95% of people and obstacles voicing only 50% of awareness to proximity.  Cued to improve scanning w/ pt not notably improving during increased distance.  Wife voices concern over intermittent left foot scuff not noted during gait today.  Trialed use of visual cues on floor w/o improvement of pathway tracking or width of BOS.     PATIENT EDUCATION: Education details:  Initial HEP.  Focus on left scanning and attention during gait at home. Person educated: Patient and Spouse Education method: Explanation and Verbal cues Education comprehension: verbalized understanding and needs further education  HOME EXERCISE PROGRAM: Access Code: [email or web address] URL: [email or web address] Date: 01/16/2023 Prepared by: Elease Etienne  Exercises - Standing Hamstring Curl with Resistance  - 1 x daily - 5 x weekly - 2 sets - 12 reps - Corner Balance Feet Together With Eyes Closed  - 1 x daily - 5 x weekly - 1 sets - 2 reps - 30-45 seconds hold - Standing Tandem Balance with Counter Support  - 1 x daily - 5 x weekly - 1 sets - 2 reps - 30-45 seconds hold - Heel Raises with Counter Support  - 1 x daily - 5 x weekly - 1-2 sets - 12 reps  GOALS: Goals reviewed with patient? Yes  SHORT TERM GOALS: Target date: 02/02/23  Pt will be IND with initial HEP in order to indicate improved functional mobility and dec fall risk. Baseline: Goal status: INITIAL  2.  Pt will ambulate 150' without AD mod I level in order to indicate more independent household ambulation.   Baseline:  Goal status: INITIAL  3.  Pt will improve FGA to >/=23/30 in  order to indicate dec fall risk.  Baseline: 19/30 Goal status: INITIAL  4.  Pt will maintain gait speed to >/=3.0 ft/sec w/o AD while scanning environment without overt LOB in order to indicate dec fall risk.  Baseline:  Goal status: INITIAL  5.  Will assess MCTSIB and update goals to reflect progress.  Baseline: Pt scored 120/120 at baseline  Goal status: MET  6.  Pt will negotiate up/down 4 steps with single rail in alt pattern, up/down ramp and curb and ambulate x 500' outdoors over unlevel paved surfaces at S level without AD in order to indicate improved community mobility.   Baseline:  Goal status: INITIAL  LONG TERM GOALS: Target date: 03/05/23  Pt will be IND with final HEP in order to indicate improved functional mobility and dec fall risk. Baseline:  Goal status: INITIAL  2.  Pt will improve FGA to >/=26/30 in order to indicate dec fall risk.   Baseline: 19/30 Goal status: INITIAL  3.  Will add LTG for MTCSIB once assessed.  Baseline: pt scored 120/120 at baseline Goal status: MET  4.  Pt will negotiate up/down 8 steps with single rail, up/down ramp and curb and ambulate x 1000' outdoors over unlevel paved surfaces without AD at mod I level while scanning environment in order to indicate improved community mobility.   Baseline:  Goal status: INITIAL    ASSESSMENT:  CLINICAL IMPRESSION: PT noted significant left inattention this session during dynamic gait with pt drifting towards left oriented people and objects w/o awareness.  Established an HEP focused on simple strengthening and static balance to ease patient  back into gym activity.  Will continue to address deficits outlined in ongoing POC especially left scanning and awareness.  OBJECTIVE IMPAIRMENTS: Abnormal gait, cardiopulmonary status limiting activity, decreased activity tolerance, decreased balance, decreased endurance, decreased mobility, decreased strength, and impaired perceived functional ability.    ACTIVITY LIMITATIONS: carrying, lifting, standing, squatting, stairs, bathing, and locomotion level  PARTICIPATION LIMITATIONS: meal prep, medication management, personal finances, driving, shopping, community activity, and yard work  PERSONAL FACTORS: Age and 3+ comorbidities: see above  are also affecting patient's functional outcome.   REHAB POTENTIAL: Good  CLINICAL DECISION MAKING: Evolving/moderate complexity  EVALUATION COMPLEXITY: Moderate  PLAN:  PT FREQUENCY: 2x/week  PT DURATION: 8 weeks  PLANNED INTERVENTIONS: Therapeutic exercises, Therapeutic activity, Neuromuscular re-education, Balance training, Gait training, Patient/Family education, Self Care, and Joint mobilization  PLAN FOR NEXT SESSION:  Add to HEP to include return to gym activities and maybe ankle strategy, gait without device, outdoor gait, dual tasking, high-level balance, left scanning w/ dynamic tasks  Elease Etienne, PT, [place] [address] [place], [place]  [address] Phone:  [phone] Fax:  [phone] 01/16/23, 5:10 PM

## 2023-01-16 NOTE — Therapy (Unsigned)
OUTPATIENT SPEECH LANGUAGE PATHOLOGY TREATMENT NOTE   Patient Name: James Estrada MRN: [id number] DOB:June 17, 1941, 82 y.o., male Today's Date: 01/16/2023  PCP: James Carol, MD REFERRING PROVIDER: [name], MD  END OF SESSION:   End of Session - 01/16/23 1406     Visit Number 6    Number of Visits 25    Date for SLP Re-Evaluation 03/20/23    SLP Start Time 1404   Pt running late   SLP Stop Time  1445    SLP Time Calculation (min) 41 min    Activity Tolerance Patient tolerated treatment well             Past Medical History:  Diagnosis Date   AAA (abdominal aortic aneurysm) ([place])    last u/s done 07/18/17    Aneurysm of left internal iliac artery ([place]) 05/22/2022   Arthritis    Bradycardia    CVA (cerebral vascular accident) ([place]) 10/04/2022   Dysrhythmia    frequent PAC, for 20 years   GERD (gastroesophageal reflux disease)    occ, OTC   Hemorrhoids    History of hiatal hernia    Hyperlipidemia    Hypertension    S/P aortic dissection repair 09/18/2022   Seasonal allergies    Type 1 dissection of thoracic aorta ([place]) 10/26/2022   Past Surgical History:  Procedure Laterality Date   ABDOMINAL AORTIC ENDOVASCULAR STENT GRAFT N/A 05/04/2020   Procedure: ABDOMINAL AORTIC ENDOVASCULAR STENT GRAFT;  Surgeon: James Posner, MD;  Location: [place];  Service: Vascular;  Laterality: N/A;   CATARACT EXTRACTION Bilateral 2009   DIAGNOSTIC LAPAROSCOPY     laparoscopic hernia repair   EYE SURGERY Bilateral    cataract removal   HERNIA REPAIR Bilateral 1999, 2006   JOINT REPLACEMENT Right ~2018   hip replacement   pheochromocytoma  1993   PROSTATECTOMY N/A 05/15/2013   Procedure: PROSTATECTOMY RETROPUBIC; SIMPLE OPEN PROSTATECTOMY;  Surgeon: James Amass, MD;  [facility];  Service: Urology;  Laterality: N/A;   REPAIR OF ACUTE ASCENDING THORACIC AORTIC DISSECTION N/A 09/17/2022   Procedure: REPAIR OF ACUTE ASCENDING THORACIC AORTIC DISSECTION USING 28 MM  HEMASHIELD PLATINUM VASCULAR GRAFT;  Surgeon: James Nakayama, MD;  Location: [place];  Service: Vascular;  Laterality: N/A;  Median sternotomy   TOTAL ELBOW REPLACEMENT Left    > 30 years ago   TOTAL HIP ARTHROPLASTY Right 09/08/2017   Procedure: RIGHT TOTAL HIP ARTHROPLASTY ANTERIOR APPROACH;  Surgeon: James Rossetti, MD;  [facility];  Service: Orthopedics;  Laterality: Right;   ULTRASOUND GUIDANCE FOR VASCULAR ACCESS Bilateral 05/04/2020   Procedure: ULTRASOUND GUIDANCE FOR VASCULAR ACCESS;  Surgeon: James Posner, MD;  Location: [facility];  Service: Vascular;  Laterality: Bilateral;   Patient Active Problem List   Diagnosis Date Noted   Type 1 dissection of thoracic aorta ([place]) 10/26/2022   CVA (cerebral vascular accident) ([place]) 10/04/2022   Sepsis with acute renal failure without septic shock ([place]) 09/30/2022   S/P aortic dissection repair 09/18/2022   Status post surgery 09/17/2022   Aneurysm of left internal iliac artery ([place]) 05/22/2022   Colon cancer screening 05/22/2022   Essential hypertension 05/22/2022   Hemorrhoids 05/22/2022   Inguinal hernia 05/22/2022   Irritable bowel syndrome 05/22/2022   Mixed hyperlipidemia 05/22/2022   Pure hypercholesterolemia 05/22/2022   Thrombocytopenia ([place]) 05/22/2022   Allergic rhinitis 05/22/2022   AAA (abdominal aortic aneurysm) without rupture ([place]) 05/04/2020   Trochanteric bursitis, right hip 08/20/2018  History of right hip replacement 08/20/2018   Status post total replacement of right hip 09/08/2017   Pain of right hip joint 08/07/2017   Unilateral primary osteoarthritis, right hip 08/07/2017   BPH (benign prostatic hypertrophy) with urinary obstruction 05/15/2013    ONSET DATE: 09/17/22  REFERRING DIAG: R13.10 (ICD-10-CM) - Dysphagia, unspecified  THERAPY DIAG:  Aphasia  Cognitive communication deficit  Rationale for Evaluation and Treatment: Rehabilitation  SUBJECTIVE:   SUBJECTIVE STATEMENT: "We did  a lot of homework, but I don't know if we finished it." "He's doing so well" (per wife) re: communication  PAIN:  Are you having pain? No   OBJECTIVE:  Pt is a 82 y/o M admitted to [facility] on 09/17/22 with type 1 aortic dissection requiring emergent repair. Head CT 11/6 acute infarcts within R MCA involvement, including R cerebellum, basal ganglia, frontal lobe. ETT 11/4-11/9. PMH AAA s/p endovascular repair (2020), HTN, HLD, GERD.   TODAY'S TREATMENT:                                                                                                                                         DATE:   01/16/23: Pt reported that he did some of his HEP. He wrote out a list of restaurants and completed a worksheet. Pt and wife endorsed that pt's communication has overall improved. Given frequent min to mod semantic and questioning cues, pt identified 3 similarities and differences between two target items (radio/tv, coffee/coke, Engineer, structural, IT trainer, piano/organ). SLP then presented pt with a pair of words and pt constructed 2 diverse sentence for 3 targets provided usual min to mod semantic cues. In narrative discourse, pt required usual verbal cues to provide clarification.  Pt reported that reading emails "is going a little better, but not much." He stated he reads many word inaccurately and he avoids reading emails because of this. HEP: Reading, sentence creation worksheet.   01/11/23: James Estrada reports difficulty writing and copying (homework was matching and copying sentence beginning and endings) Targeted written expression and word finding in divergent naming task - restaurants they frequent. James Estrada generated 13 restaurants with usual min to [employer]. James Estrada required consistent mod to max  A to ID and correct errors in written words with consistent mod verbal and written cues. In narrative discourse telling how he met his wife, James Estrada required usual questioning cues for clarification and verbal  instructions for cohesion. After initial instructions, James Estrada ID'd areas where he lacked cohesion. With usual verbal cues he used verbal compensations for word finding episodes.  01/04/23: To target sentence generation, word finding and reading comprehension  Pharmacist, hospital ([facility]) was utilized. The pt generated 3 subjects and objects for 3 verbs (cast, operate, decorate), for a total of 9 subject objects. Pt required usual mod semantic and questioning cues cues. Pt generated 3 complex sentences by answering "wh" questions. Pt required occasional min  semantic cues to generate complex sentences. Targeted written expression and divergent naming in personally relevant category (army) he generated 9 army terms, Id'd written errors, however required mod A to self correct. They continue to complete all homework consistently. Provided information on [employer] and [employer] news with demonstration   01/02/23: Targeted verbal compensations for aphasia, initially using Semantic Feature Analysis chart fading to structured task generating 3 salient descriptions for basic objects, progressing from photo to written word with occasional min questioning cues and semantic cues. Targeted word finding and auditory comprehension in convergent naming task. [place] named 10 items with rare repetition and required semantic cues and 1st letter cues for 3/10. Targeted divergent naming in personally relevant category (stock market) - Aneesh named 15 words with extended time, usual mod semantic and questioning cues. Generated strategy to use written notes to make phone calls as needed (see patient instructions)  12/28/22:  Aphasia ID card provided.  To target word finding, sentence generation, reading comrehension  Pharmacist, hospital ([facility]) was utilized. The pt generated 3 subjects and objects for 3 verbs (drive, measure, kick), for a total of 9 subject objects. Pt required extended time and usual  min to mod semantic and phonemic  cues. Pt generated 3 complex sentences by answering "wh" questions. Pt required usual min semantic and written  cues to generate complex sentences and comprehend "wh" questions. Divergent naming targeted in personally relevant category, golf, Jahmal generated 15 words with extended time, usual min semantic cues. With questioning cues and instruction to "tell me about this" Elvia successfully used verbal compensations to communicate a favorite restaurant. Targeted reading comprehension and phone access - Sharman required extended time, visual cues to locate and select text message icon. He read 3 sentence text with questioning cues for accurate comprehension of text.   12/26/22 (eval day): initiated education re: word finding activities to do at home, environmental compensations to maximize communication, family behaviors to support communication     PATIENT EDUCATION: Education details: See patient instructions, see today's treatment, compensations for aphasia Person educated: Patient and Spouse Education method: Explanation, Verbal cues, and Handouts Education comprehension: verbal cues required and needs further education     GOALS: Goals reviewed with patient? Yes   SHORT TERM GOALS: Target date: 01/23/23   Pt will name 10 items in personally relevant categories over 3 sessions with occasional min A Baseline: Goal status: ONGOING   2.  Pt will write 4 item grocery list with occasional min A Baseline:  Goal status: ONGOING   3.  Pt will respond to 3 texts with occasional min A over 1 week Baseline:  Goal status: ONGOING   4.  Pt will generate complex verbal sentences 3x using Pharmacist, hospital with rare min A over 2 sessions Baseline:  Goal status: ONGOING   5.  Pt will comprehend 4 sentence passage/email with extended time and rare min A Baseline:  Goal status: ONGOING   6.  Pt will employ compensations for aphasia in  structured language tasks with occasional min A Baseline:  Goal status: ONGOING   LONG TERM GOALS: Target date: 03/20/23   Pt will complete complex naming tasks with 80% accuracy and occasional min A Baseline:  Goal status: ONGOING   2.  Pt will carryover verbal compensations for aphasia with occasional min A in conversation as needed over 2 sessions Baseline:  Goal status: ONGOING   3.  Pt will carryover 3 strategies to successfully communicate in community with  clerks, servers etc Baseline:  Goal status: ONGOING   4.  Pt will write 3 sentence text or email with occasional min A, external aids and occasional min A  Baseline:  Goal status: ONGOING   5.  Pt will improve score on Communicative Participation Item Bank by 4 points Baseline: 14/30 Goal status: ONGOING       ASSESSMENT:   CLINICAL IMPRESSION: Patient is a 82 y.o. male who was seen today for aphasia s/p CVA. He is accompanied by his spouse James Estrada. Prior to CVA, Graviel was active golfing, managing finances,  keeping up with the stock market and socializing with friends. At this time, he has difficulty using his phone and remote and is not texting due to impaired written expression. James Estrada is managing his medications and appointments. He has difficulty reading numbers, letters and words. James Estrada reports he is "quick to give up" when he encounters a communication breakdown. They plan to move into independent living at [facility] in the spring.  Ongoing training in compensations for aphasia and targeting written and verbal expression as well as reading comprehension. I recommend skilled ST to maximize verbal and written communication and cognition for safety, to reduce caregiver burden and QOL.    OBJECTIVE IMPAIRMENTS: include memory and aphasia. These impairments are limiting patient from managing medications, managing appointments, managing finances, household responsibilities, ADLs/IADLs, and effectively communicating at home  and in community. Factors affecting potential to achieve goals and functional outcome are ability to learn/carryover information. Patient will benefit from skilled SLP services to address above impairments and improve overall function.   REHAB POTENTIAL: Good   PLAN:   SLP FREQUENCY: 2x/week   SLP DURATION: 12 weeks   PLANNED INTERVENTIONS: Language facilitation, Environmental controls, Cueing hierachy, Cognitive reorganization, Internal/external aids, Functional tasks, and Multimodal communication approach         Leroy Libman, Student-SLP 01/16/2023, 2:07 PM

## 2023-01-16 NOTE — Patient Instructions (Signed)
Access Code: [email or web address] URL: [email or web address] Date: 01/16/2023 Prepared by: Elease Etienne  Exercises - Standing Hamstring Curl with Resistance  - 1 x daily - 5 x weekly - 2 sets - 12 reps - Corner Balance Feet Together With Eyes Closed  - 1 x daily - 5 x weekly - 1 sets - 2 reps - 30-45 seconds hold - Standing Tandem Balance with Counter Support  - 1 x daily - 5 x weekly - 1 sets - 2 reps - 30-45 seconds hold - Heel Raises with Counter Support  - 1 x daily - 5 x weekly - 1-2 sets - 12 reps

## 2023-01-16 NOTE — Therapy (Signed)
OUTPATIENT OCCUPATIONAL THERAPY NEURO EVALUATION  Patient Name: James Estrada MRN: [id number] DOB:August 03, 1941, 82 y.o., male Today's Date: 01/16/2023  PCP: [name], MD  REFERRING PROVIDER: Jolaine Artist, MD  END OF SESSION:  OT End of Session - 01/16/23 1452     Visit Number 1    Number of Visits 13    Date for OT Re-Evaluation 04/14/23    Authorization Type BCBS - no auth required    Progress Note Due on Visit 10    OT Start Time 1451    OT Stop Time 1534    OT Time Calculation (min) 43 min    Activity Tolerance Patient tolerated treatment well    Behavior During Therapy [facility] for tasks assessed/performed             Past Medical History:  Diagnosis Date   AAA (abdominal aortic aneurysm) ([place])    last u/s done 07/18/17    Aneurysm of left internal iliac artery ([place]) 05/22/2022   Arthritis    Bradycardia    CVA (cerebral vascular accident) ([place]) 10/04/2022   Dysrhythmia    frequent PAC, for 20 years   GERD (gastroesophageal reflux disease)    occ, OTC   Hemorrhoids    History of hiatal hernia    Hyperlipidemia    Hypertension    S/P aortic dissection repair 09/18/2022   Seasonal allergies    Type 1 dissection of thoracic aorta ([place]) 10/26/2022   Past Surgical History:  Procedure Laterality Date   ABDOMINAL AORTIC ENDOVASCULAR STENT GRAFT N/A 05/04/2020   Procedure: ABDOMINAL AORTIC ENDOVASCULAR STENT GRAFT;  Surgeon: [name], MD;  Location: [place];  Service: Vascular;  Laterality: N/A;   CATARACT EXTRACTION Bilateral 2009   DIAGNOSTIC LAPAROSCOPY     laparoscopic hernia repair   EYE SURGERY Bilateral    cataract removal   HERNIA REPAIR Bilateral 1999, 2006   JOINT REPLACEMENT Right ~2018   hip replacement   pheochromocytoma  1993   PROSTATECTOMY N/A 05/15/2013   Procedure: PROSTATECTOMY RETROPUBIC; SIMPLE OPEN PROSTATECTOMY;  Surgeon: [name], MD;  [facility];  Service: Urology;  Laterality: N/A;   REPAIR OF ACUTE ASCENDING  THORACIC AORTIC DISSECTION N/A 09/17/2022   Procedure: REPAIR OF ACUTE ASCENDING THORACIC AORTIC DISSECTION USING 28 MM HEMASHIELD PLATINUM VASCULAR GRAFT;  Surgeon: [name], MD;  Location: [place];  Service: Vascular;  Laterality: N/A;  Median sternotomy   TOTAL ELBOW REPLACEMENT Left    > 30 years ago   TOTAL HIP ARTHROPLASTY Right 09/08/2017   Procedure: RIGHT TOTAL HIP ARTHROPLASTY ANTERIOR APPROACH;  Surgeon: [name], MD;  [facility];  Service: Orthopedics;  Laterality: Right;   ULTRASOUND GUIDANCE FOR VASCULAR ACCESS Bilateral 05/04/2020   Procedure: ULTRASOUND GUIDANCE FOR VASCULAR ACCESS;  Surgeon: [name], MD;  [facility];  Service: Vascular;  Laterality: Bilateral;   Patient Active Problem List   Diagnosis Date Noted   Type 1 dissection of thoracic aorta ([place]) 10/26/2022   CVA (cerebral vascular accident) ([place]) 10/04/2022   Sepsis with acute renal failure without septic shock ([place]) 09/30/2022   S/P aortic dissection repair 09/18/2022   Status post surgery 09/17/2022   Aneurysm of left internal iliac artery ([place]) 05/22/2022   Colon cancer screening 05/22/2022   Essential hypertension 05/22/2022   Hemorrhoids 05/22/2022   Inguinal hernia 05/22/2022   Irritable bowel syndrome 05/22/2022   Mixed hyperlipidemia 05/22/2022   Pure hypercholesterolemia 05/22/2022   Thrombocytopenia ([place]) 05/22/2022  Allergic rhinitis 05/22/2022   AAA (abdominal aortic aneurysm) without rupture ([facility]) 05/04/2020   Trochanteric bursitis, right hip 08/20/2018   History of right hip replacement 08/20/2018   Status post total replacement of right hip 09/08/2017   Pain of right hip joint 08/07/2017   Unilateral primary osteoarthritis, right hip 08/07/2017   BPH (benign prostatic hypertrophy) with urinary obstruction 05/15/2013    ONSET DATE: 12/28/2022 (CVA on 09/17/22)   REFERRING DIAG: I63.19 (ICD-10-CM) - Cerebrovascular accident (CVA) due to embolism of  other precerebral artery  THERAPY DIAG:  Muscle weakness (generalized)  Other lack of coordination  Visuospatial deficit  Other symptoms and signs involving the nervous system  Rationale for Evaluation and Treatment: Rehabilitation  SUBJECTIVE:   SUBJECTIVE STATEMENT: Has had limited L supination since elbow fracture prior to CVA. He has been using block letters for writing but is highly motivated to work on his handwriting in therapy.   Pt accompanied by: significant other- Betty  PERTINENT HISTORY:  "Pt is a 82 y/o M admitted to [facility] on 09/17/22 with type 1 aortic dissection requiring emergent repair. Head CT 11/6 acute infarcts within R MCA involvement, including R cerebellum, basal ganglia, frontal lobe. ETT 11/4-11/9. PMH AAA s/p endovascular repair (2020), HTN, HLD, GERD. Hosoitalized 09/18/23 to 10/21/23.Had [place], PT, SLP."    PRECAUTIONS: Fall  WEIGHT BEARING RESTRICTIONS: No  PAIN:  Are you having pain? No  FALLS: Has patient fallen in last 6 months? Yes. Number of falls 1  LIVING ENVIRONMENT: Lives with: lives with their family Lives in: House/apartment (lives at daughters house-will be moving to [place] in April/May to single home).  Stairs: Yes: Internal: flight but doesn't use steps; on left going up and External: 2 steps; none Has following equipment at home: Single point cane, shower chair, Grab bars, and tub/shower    PLOF: Independent, retired Designer, fashion/clothing (lots of computer use), golf, went to the gym on a daily basis, was an avid gardener; driving   PATIENT GOALS: return to PLOF   OBJECTIVE:   HAND DOMINANCE: Left  ADLs: Overall ADLs: mostly mod I with assistance in cutting up meat  IADLs: Shopping: max A Meal Prep: liked to cook and bake - has been able to make some meals with min A Community mobility: dependent Medication management: dependent Doctor, hospital: dependent Handwriting: 25% legible  MOBILITY STATUS: Needs Assist:  SPC for community mobility  ACTIVITY TOLERANCE: Activity tolerance: Fair  FUNCTIONAL OUTCOME MEASURES: FOTO: to be completed at a later date  UPPER EXTREMITY ROM:     AROM Right (eval) Left (eval)  Shoulder flexion WNL WNL  Shoulder abduction WNL WNL  Elbow flexion WNL WNL  Elbow extension WNL WNL  Wrist flexion WNL WNL  Wrist extension WNL WNL  Wrist pronation WNL WNL  Wrist supination WNL Impaired (chronic)   Digit Composite Flexion WNL WNL  Digit Composite Extension WNL WNL  Digit Opposition WNL WNL  (Blank rows = not tested)  UPPER EXTREMITY MMT:     MMT Right (eval) Left (eval)  Shoulder flexion WNL WFL  Shoulder abduction WNL WFL  Elbow flexion WNL WFL  Elbow extension WNL WFL  (Blank rows = not tested)  HAND FUNCTION: Grip strength: Right: 72.9 lbs; Left: 41 lbs  COORDINATION: Finger Nose Finger test: impaired on L 9 Hole Peg test: Right: 33.5 sec; Left: 44.6 sec  SENSATION: WFL  EDEMA: none observed  MUSCLE TONE: LUE: Within functional limits  COGNITION: Overall cognitive status: Impaired and See ST  note for further details  VISION: Subjective report: some boughts of double vision Baseline vision: Wears glasses for reading only and OTC readers Visual history: cataracts  VISION ASSESSMENT: Tracking/Visual pursuits: Decreased smoothness with horizontal tracking Visual Fields: Left visual field deficits  PERCEPTION: Impaired: Inattention/neglect: difficulty discriminating R vs L  PRAXIS: WFL  OBSERVATIONS: Pt appears well kept and uses SPC for mobility. No LOB noted. Difficulty with recall noted requiring assistance from wife though in general able to answer questions appropriately.    TODAY'S TREATMENT:                                                                                                                               Eval only this date  PATIENT EDUCATION: Education details: OT role and POC Person educated: Patient and  Spouse Education method: Explanation Education comprehension: verbalized understanding  HOME EXERCISE PROGRAM: Not yet initiated   GOALS:  SHORT TERM GOALS: Target date: 02/13/2023    Patient will demonstrate LUE HEP with 25% verbal cues or less for proper execution. Baseline: Goal status: INITIAL  2.  Patient will independently recall at least 2 compensatory strategies for visual impairment without cueing. Baseline:  Goal status: INITIAL  3.  Pt will independently recall at least 1 strategy for L inattention.  Baseline:  Goal status: INITIAL  LONG TERM GOALS: Target date: 04/14/2023  Patient will demonstrate updated LUE HEP with 25% verbal cues or less for proper execution. Baseline:  Goal status: INITIAL  2.  Pt will complete FOTO at time of discharge.  Baseline:  Goal status: INITIAL  3.  Patient will complete nine-hole peg with use of L in 35 seconds or less. Baseline: 44.6 seconds Goal status: INITIAL  4.  Patient will demonstrate at least 55 lbs L grip strength as needed to open jars and other containers. Baseline: 41 lbs Goal status: INITIAL  5.  Pt will report improved writing legibility with use of L and AD as needed to at least 75% Baseline: 25% legibility Goal status: INITIAL   ASSESSMENT:  CLINICAL IMPRESSION: Patient is a 82 y.o. male who was seen today for occupational therapy evaluation following CVA. Hx includes multiple joint replacements and abdominal aortic aneurysm status post stent graft placement July, 2021. Patient currently presents below baseline level of functioning demonstrating functional deficits and impairments as noted below. Pt would benefit from skilled OT services in the outpatient setting to work on impairments as noted below to help pt return to PLOF as able.    PERFORMANCE DEFICITS: in functional skills including ADLs, IADLs, coordination, proprioception, ROM, strength, pain, Fine motor control, mobility, endurance, decreased  knowledge of precautions, vision, and UE functional use.   IMPAIRMENTS: are limiting patient from ADLs, IADLs, leisure, and social participation.   CO-MORBIDITIES: may have co-morbidities  that affects occupational performance. Patient will benefit from skilled OT to address above impairments and improve overall function.  MODIFICATION OR ASSISTANCE TO COMPLETE EVALUATION: Min-Moderate  modification of tasks or assist with assess necessary to complete an evaluation.  OT OCCUPATIONAL PROFILE AND HISTORY: Problem focused assessment: Including review of records relating to presenting problem.  CLINICAL DECISION MAKING: LOW - limited treatment options, no task modification necessary  REHAB POTENTIAL: Good  EVALUATION COMPLEXITY: Low    PLAN:  OT FREQUENCY: 2x/week  OT DURATION: 12 weeks  PLANNED INTERVENTIONS: self care/ADL training, therapeutic exercise, therapeutic activity, neuromuscular re-education, functional mobility training, electrical stimulation, ultrasound, moist heat, patient/family education, cognitive remediation/compensation, visual/perceptual remediation/compensation, DME and/or AE instructions, and Re-evaluation  RECOMMENDED OTHER SERVICES: none at this time  CONSULTED AND AGREED WITH PLAN OF CARE: Patient and family member/caregiver  PLAN FOR NEXT SESSION: initiate diplopia HEP, visual strategies, continue to assess vision and L inattention   Dennis Bast, OT 01/16/2023, 5:26 PM

## 2023-01-18 ENCOUNTER — Ambulatory Visit: Payer: Medicare Other | Admitting: Physical Therapy

## 2023-01-18 ENCOUNTER — Ambulatory Visit: Payer: Medicare Other | Admitting: Speech Pathology

## 2023-01-18 ENCOUNTER — Encounter: Payer: Self-pay | Admitting: Physical Therapy

## 2023-01-18 ENCOUNTER — Encounter: Payer: Self-pay | Admitting: Speech Pathology

## 2023-01-18 DIAGNOSIS — I69352 Hemiplegia and hemiparesis following cerebral infarction affecting left dominant side: Secondary | ICD-10-CM | POA: Diagnosis not present

## 2023-01-18 DIAGNOSIS — R2681 Unsteadiness on feet: Secondary | ICD-10-CM

## 2023-01-18 DIAGNOSIS — R278 Other lack of coordination: Secondary | ICD-10-CM | POA: Diagnosis not present

## 2023-01-18 DIAGNOSIS — R4701 Aphasia: Secondary | ICD-10-CM | POA: Diagnosis not present

## 2023-01-18 DIAGNOSIS — R2689 Other abnormalities of gait and mobility: Secondary | ICD-10-CM | POA: Diagnosis not present

## 2023-01-18 DIAGNOSIS — R29818 Other symptoms and signs involving the nervous system: Secondary | ICD-10-CM | POA: Diagnosis not present

## 2023-01-18 DIAGNOSIS — M6281 Muscle weakness (generalized): Secondary | ICD-10-CM

## 2023-01-18 DIAGNOSIS — R41842 Visuospatial deficit: Secondary | ICD-10-CM | POA: Diagnosis not present

## 2023-01-18 DIAGNOSIS — R41841 Cognitive communication deficit: Secondary | ICD-10-CM | POA: Diagnosis not present

## 2023-01-18 NOTE — Therapy (Signed)
OUTPATIENT PHYSICAL THERAPY NEURO TREATMENT   Patient Name: James Estrada MRN: [id number] DOB:1941/03/30, 82 y.o., male Today's Date: 01/18/2023   PCP: James Carol, MD REFERRING PROVIDER: Jolaine Artist, MD  END OF SESSION:  PT End of Session - 01/18/23 1017     Visit Number 4    Number of Visits 17    Date for PT Re-Evaluation 03/05/23    Authorization Type BCBS Medicare (needs 10th visit PN)    Progress Note Due on Visit 10    PT Start Time 1016    PT Stop Time 1059    PT Time Calculation (min) 43 min    Equipment Utilized During Treatment Gait belt    Activity Tolerance Patient tolerated treatment well    Behavior During Therapy WFL for tasks assessed/performed             Past Medical History:  Diagnosis Date   AAA (abdominal aortic aneurysm) ([employer])    last u/s done 07/18/17    Aneurysm of left internal iliac artery ([place]) 05/22/2022   Arthritis    Bradycardia    CVA (cerebral vascular accident) ([place]) 10/04/2022   Dysrhythmia    frequent PAC, for 20 years   GERD (gastroesophageal reflux disease)    occ, OTC   Hemorrhoids    History of hiatal hernia    Hyperlipidemia    Hypertension    S/P aortic dissection repair 09/18/2022   Seasonal allergies    Type 1 dissection of thoracic aorta ([place]) 10/26/2022   Past Surgical History:  Procedure Laterality Date   ABDOMINAL AORTIC ENDOVASCULAR STENT GRAFT N/A 05/04/2020   Procedure: ABDOMINAL AORTIC ENDOVASCULAR STENT GRAFT;  Surgeon: James Posner, MD;  Location: [place];  Service: Vascular;  Laterality: N/A;   CATARACT EXTRACTION Bilateral 2009   DIAGNOSTIC LAPAROSCOPY     laparoscopic hernia repair   EYE SURGERY Bilateral    cataract removal   HERNIA REPAIR Bilateral 1999, 2006   JOINT REPLACEMENT Right ~2018   hip replacement   pheochromocytoma  1993   PROSTATECTOMY N/A 05/15/2013   Procedure: PROSTATECTOMY RETROPUBIC; SIMPLE OPEN PROSTATECTOMY;  Surgeon: James Amass, MD;  [facility];  Service:  Urology;  Laterality: N/A;   REPAIR OF ACUTE ASCENDING THORACIC AORTIC DISSECTION N/A 09/17/2022   Procedure: REPAIR OF ACUTE ASCENDING THORACIC AORTIC DISSECTION USING 28 MM HEMASHIELD PLATINUM VASCULAR GRAFT;  Surgeon: James Nakayama, MD;  Location: [place];  Service: Vascular;  Laterality: N/A;  Median sternotomy   TOTAL ELBOW REPLACEMENT Left    > 30 years ago   TOTAL HIP ARTHROPLASTY Right 09/08/2017   Procedure: RIGHT TOTAL HIP ARTHROPLASTY ANTERIOR APPROACH;  Surgeon: James Rossetti, MD;  [facility];  Service: Orthopedics;  Laterality: Right;   ULTRASOUND GUIDANCE FOR VASCULAR ACCESS Bilateral 05/04/2020   Procedure: ULTRASOUND GUIDANCE FOR VASCULAR ACCESS;  Surgeon: James Posner, MD;  Location[facility];  Service: Vascular;  Laterality: Bilateral;   Patient Active Problem List   Diagnosis Date Noted   Type 1 dissection of thoracic aorta ([place]) 10/26/2022   CVA (cerebral vascular accident) ([place]) 10/04/2022   Sepsis with acute renal failure without septic shock ([place]) 09/30/2022   S/P aortic dissection repair 09/18/2022   Status post surgery 09/17/2022   Aneurysm of left internal iliac artery ([place]) 05/22/2022   Colon cancer screening 05/22/2022   Essential hypertension 05/22/2022   Hemorrhoids 05/22/2022   Inguinal hernia 05/22/2022   Irritable bowel syndrome 05/22/2022   Mixed hyperlipidemia  05/22/2022   Pure hypercholesterolemia 05/22/2022   Thrombocytopenia ([place]) 05/22/2022   Allergic rhinitis 05/22/2022   AAA (abdominal aortic aneurysm) without rupture ([place]) 05/04/2020   Trochanteric bursitis, right hip 08/20/2018   History of right hip replacement 08/20/2018   Status post total replacement of right hip 09/08/2017   Pain of right hip joint 08/07/2017   Unilateral primary osteoarthritis, right hip 08/07/2017   BPH (benign prostatic hypertrophy) with urinary obstruction 05/15/2013    ONSET DATE: 12/28/2022 (CVA on 09/17/22)  REFERRING DIAG: I63.19  (ICD-10-CM) - Cerebrovascular accident (CVA) due to embolism of other precerebral artery ([place])  THERAPY DIAG:  Muscle weakness (generalized)  Other lack of coordination  Unsteadiness on feet  Other abnormalities of gait and mobility  Rationale for Evaluation and Treatment: Rehabilitation  SUBJECTIVE:                                                                                                                                                                                             SUBJECTIVE STATEMENT: Pt reports he is doing well, denies pain or falls.  He states the left foot remains swollen.  He has not had a chance to complete HEP.   Pt accompanied by:  Wife, James Estrada   PERTINENT HISTORY:  Pt is a 82 y/o M admitted to [facility] on 09/17/22 with type 1 aortic dissection requiring emergent repair. Head CT 11/6 acute infarcts within R MCA involvement, including R cerebellum, basal ganglia, frontal lobe. ETT 11/4-11/9. PMH AAA s/p endovascular repair (2020), HTN, HLD, GERD. Hosoitalized 09/18/23 to 10/21/23.Had [place], PT, SLP.    PAIN:  Are you having pain? No  VITALS There were no vitals filed for this visit.    PRECAUTIONS: Fall  OBJECTIVE:  TODAY'S TREATMENT:    NMR: Ball pass left and right 4x30' w/ cues to improve left rotation, noted decreased left gaze during passing despite cues to improve head turns Standing normal BOS on airex w/ long-lever 1.1lb ball w/ eye tracking during rotation to left and right, CGA, mild-moderate sway noted, forward trunk lean Pt completes cone search for yellow cones only (3 other distracting colors), obtains 4/5 on first pass and remaining cone on second pass.  During first pass decreased left scanning noted in initial half of lap. Standing feet together on airex performing crossbody cone retrieval to place on eye level left oriented shelf and return to counter Standing feet together eyes closed on airex 2 rounds x24 seconds and 33 seconds w/ moderate  sway and increased left knee flexion in stance compared to right BUE support for 6" step ups w/ ipsilateral head turns, pt maintains  good coordination and upright throughout using visual scanning and mirror feedback to self-correct posture  GAIT: Gait pattern: step through pattern, Left foot flat, trendelenburg, lateral lean- Left, and wide BOS Distance walked: 400'  Assistive device utilized: None Level of assistance: SBA and CGA Comments:  Left inattention appearing better today, it is of note patient was last seen in afternoon when fatigue may be impacting drift noted.  He has single instance of right LE crossover to the left w/ recovery w/ CGA.  While he maintains pathway trajectory w/ dual task on second lap he is only able to name 7 animals in 200' w/ several instances of slowed/stopped gait.     PATIENT EDUCATION: Education details:  Try HEP with feedback for PT on safety in home and overall enjoyment of tasks for further modification as appropriate.  Focus on left scanning and attention during gait at home. Person educated: Patient and Spouse Education method: Explanation and Verbal cues Education comprehension: verbalized understanding and needs further education  HOME EXERCISE PROGRAM: Access Code: [email or web address] URL: [email or web address] Date: 01/16/2023 Prepared by: Elease Etienne  Exercises - Standing Hamstring Curl with Resistance  - 1 x daily - 5 x weekly - 2 sets - 12 reps - Corner Balance Feet Together With Eyes Closed  - 1 x daily - 5 x weekly - 1 sets - 2 reps - 30-45 seconds hold - Standing Tandem Balance with Counter Support  - 1 x daily - 5 x weekly - 1 sets - 2 reps - 30-45 seconds hold - Heel Raises with Counter Support  - 1 x daily - 5 x weekly - 1-2 sets - 12 reps  GOALS: Goals reviewed with patient? Yes  SHORT TERM GOALS: Target date: 02/02/23  Pt will be IND with initial HEP in order to indicate improved functional mobility and dec fall  risk. Baseline: Goal status: INITIAL  2.  Pt will ambulate 150' without AD mod I level in order to indicate more independent household ambulation.   Baseline:  Goal status: INITIAL  3.  Pt will improve FGA to >/=23/30 in order to indicate dec fall risk.  Baseline: 19/30 Goal status: INITIAL  4.  Pt will maintain gait speed to >/=3.0 ft/sec w/o AD while scanning environment without overt LOB in order to indicate dec fall risk.  Baseline:  Goal status: INITIAL  5.  Will assess MCTSIB and update goals to reflect progress.  Baseline: Pt scored 120/120 at baseline  Goal status: MET  6.  Pt will negotiate up/down 4 steps with single rail in alt pattern, up/down ramp and curb and ambulate x 500' outdoors over unlevel paved surfaces at S level without AD in order to indicate improved community mobility.   Baseline:  Goal status: INITIAL  LONG TERM GOALS: Target date: 03/05/23  Pt will be IND with final HEP in order to indicate improved functional mobility and dec fall risk. Baseline:  Goal status: INITIAL  2.  Pt will improve FGA to >/=26/30 in order to indicate dec fall risk.   Baseline: 19/30 Goal status: INITIAL  3.  Will add LTG for MTCSIB once assessed.  Baseline: pt scored 120/120 at baseline Goal status: MET  4.  Pt will negotiate up/down 8 steps with single rail, up/down ramp and curb and ambulate x 1000' outdoors over unlevel paved surfaces without AD at mod I level while scanning environment in order to indicate improved community mobility.   Baseline:  Goal status: INITIAL    ASSESSMENT:  CLINICAL IMPRESSION: Emphasis of skilled PT session on addressing left inattention and improved scanning during functional dual tasking like ambulation with cognitive and manual additions.  Pt has improved scanning this session with less left drift noticed during ambulation.  He notes stiffness during left rotation so PT attempted to address with functional rotation task to also  address scanning and static stability.  Pt tolerates all challenges in session well and continues to benefit from ongoing skilled PT POC to progress towards PLOF.  OBJECTIVE IMPAIRMENTS: Abnormal gait, cardiopulmonary status limiting activity, decreased activity tolerance, decreased balance, decreased endurance, decreased mobility, decreased strength, and impaired perceived functional ability.   ACTIVITY LIMITATIONS: carrying, lifting, standing, squatting, stairs, bathing, and locomotion level  PARTICIPATION LIMITATIONS: meal prep, medication management, personal finances, driving, shopping, community activity, and yard work  PERSONAL FACTORS: Age and 3+ comorbidities: see above  are also affecting patient's functional outcome.   REHAB POTENTIAL: Good  CLINICAL DECISION MAKING: Evolving/moderate complexity  EVALUATION COMPLEXITY: Moderate  PLAN:  PT FREQUENCY: 2x/week  PT DURATION: 8 weeks  PLANNED INTERVENTIONS: Therapeutic exercises, Therapeutic activity, Neuromuscular re-education, Balance training, Gait training, Patient/Family education, Self Care, and Joint mobilization  PLAN FOR NEXT SESSION:  Add to HEP to include return to gym activities and maybe ankle strategy, gait without device, outdoor gait, dual tasking, high-level balance, left scanning w/ dynamic tasks, elliptical again-try small bursts, gait w/ dual task, fwd/backward ambulation w/ ball toss, obstacle course  Elease Etienne, PT, DPT [facility] [address] [place], [place]  [address] Phone:  [phone] Fax:  [phone] 01/18/23, 11:03 AM

## 2023-01-18 NOTE — Patient Instructions (Addendum)
   After you complete your HW, go back later and check it - correct any errors or circle them if you can't correct them (you are free to use your phone [employer] - for spelling help - I do this ALL the time)  Your goal is to send 3 texts (not just emoji's or thumbs up) by next session   Encourage your kids to text you on occasion so you can practice - If you feel you need it to be checked, [name] can check it and help you correct

## 2023-01-18 NOTE — Therapy (Signed)
OUTPATIENT SPEECH LANGUAGE PATHOLOGY TREATMENT NOTE   Patient Name: James Estrada MRN: [id number] DOB:09/14/1941, 82 y.o., male Today's Date: 01/18/2023  PCP: [name], MD REFERRING PROVIDER: [name], MD  END OF SESSION:   End of Session - 01/18/23 1101     Visit Number 7    Number of Visits 25    Date for SLP Re-Evaluation 03/20/23    SLP Start Time 27    SLP Stop Time  [id number]    SLP Time Calculation (min) 45 min             Past Medical History:  Diagnosis Date   AAA (abdominal aortic aneurysm) ([place])    last u/s done 07/18/17    Aneurysm of left internal iliac artery ([place]) 05/22/2022   Arthritis    Bradycardia    CVA (cerebral vascular accident) ([place]) 10/04/2022   Dysrhythmia    frequent PAC, for 20 years   GERD (gastroesophageal reflux disease)    occ, OTC   Hemorrhoids    History of hiatal hernia    Hyperlipidemia    Hypertension    S/P aortic dissection repair 09/18/2022   Seasonal allergies    Type 1 dissection of thoracic aorta ([place]) 10/26/2022   Past Surgical History:  Procedure Laterality Date   ABDOMINAL AORTIC ENDOVASCULAR STENT GRAFT N/A 05/04/2020   Procedure: ABDOMINAL AORTIC ENDOVASCULAR STENT GRAFT;  Surgeon: [name], MD;  Location: [facility];  Service: Vascular;  Laterality: N/A;   CATARACT EXTRACTION Bilateral 2009   DIAGNOSTIC LAPAROSCOPY     laparoscopic hernia repair   EYE SURGERY Bilateral    cataract removal   HERNIA REPAIR Bilateral 1999, 2006   JOINT REPLACEMENT Right ~2018   hip replacement   pheochromocytoma  1993   PROSTATECTOMY N/A 05/15/2013   Procedure: PROSTATECTOMY RETROPUBIC; SIMPLE OPEN PROSTATECTOMY;  Surgeon: [name], MD;  Location: [facility];  Service: Urology;  Laterality: N/A;   REPAIR OF ACUTE ASCENDING THORACIC AORTIC DISSECTION N/A 09/17/2022   Procedure: REPAIR OF ACUTE ASCENDING THORACIC AORTIC DISSECTION USING 28 MM HEMASHIELD PLATINUM VASCULAR GRAFT;  Surgeon: [name], MD;   Location: [facility];  Service: Vascular;  Laterality: N/A;  Median sternotomy   TOTAL ELBOW REPLACEMENT Left    > 30 years ago   TOTAL HIP ARTHROPLASTY Right 09/08/2017   Procedure: RIGHT TOTAL HIP ARTHROPLASTY ANTERIOR APPROACH;  Surgeon: [name], MD;  Location: [facility];  Service: Orthopedics;  Laterality: Right;   ULTRASOUND GUIDANCE FOR VASCULAR ACCESS Bilateral 05/04/2020   Procedure: ULTRASOUND GUIDANCE FOR VASCULAR ACCESS;  Surgeon: [name], MD;  Location: [facility];  Service: Vascular;  Laterality: Bilateral;   Patient Active Problem List   Diagnosis Date Noted   Type 1 dissection of thoracic aorta ([place]) 10/26/2022   CVA (cerebral vascular accident) ([place]) 10/04/2022   Sepsis with acute renal failure without septic shock ([place]) 09/30/2022   S/P aortic dissection repair 09/18/2022   Status post surgery 09/17/2022   Aneurysm of left internal iliac artery ([place]) 05/22/2022   Colon cancer screening 05/22/2022   Essential hypertension 05/22/2022   Hemorrhoids 05/22/2022   Inguinal hernia 05/22/2022   Irritable bowel syndrome 05/22/2022   Mixed hyperlipidemia 05/22/2022   Pure hypercholesterolemia 05/22/2022   Thrombocytopenia ([place]) 05/22/2022   Allergic rhinitis 05/22/2022   AAA (abdominal aortic aneurysm) without rupture ([place]) 05/04/2020   Trochanteric bursitis, right hip 08/20/2018   History of right hip replacement 08/20/2018   Status post total replacement of  right hip 09/08/2017   Pain of right hip joint 08/07/2017   Unilateral primary osteoarthritis, right hip 08/07/2017   BPH (benign prostatic hypertrophy) with urinary obstruction 05/15/2013    ONSET DATE: 09/17/22  REFERRING DIAG: R13.10 (ICD-10-CM) - Dysphagia, unspecified  THERAPY DIAG:  Aphasia  Rationale for Evaluation and Treatment: Rehabilitation  SUBJECTIVE:   SUBJECTIVE STATEMENT: "We did a lot of homework, but I don't know if we finished it." "He's doing so well" (per wife) re:  communication  PAIN:  Are you having pain? No   OBJECTIVE:  Pt is a 82 y/o M admitted [facility] on 09/17/22 with type 1 aortic dissection requiring emergent repair. Head CT 11/6 acute infarcts within R MCA involvement, including R cerebellum, basal ganglia, frontal lobe. ETT 11/4-11/9. PMH AAA s/p endovascular repair (2020), HTN, HLD, GERD.   TODAY'S TREATMENT:                                                                                                                                         DATE:   01/18/23: [name] completed HW - generating sentence with 2 words. Targeted divergent naming in moderately complex categories and written expression at the word level [name] generated 6 items in each category independently and 8 for each with semantic cues for the remaining 2 (goal for this was 8 - items in deli and items in bakery as he did grocery shopping before the stroke). [name] generated (verbally) 6 ingredients for shrimp scampi (a dish he makes) with extended time and rare min A To target complex sentence generation, word finding, reading comprehension  Pharmacist, hospital ([facility] was utilized. The pt generated 3 subjects and objects for 2 verbs (pull, scratch), for a total of 6 subject objects. Pt required occasional min semantic cues. Pt generated 2 complex sentences by answering "wh" questions. Pt required extended time and rare min  cues to generate complex sentences. [name] is to text 3 family members, using [employer] if he needs help spelling or having [name] check his texts an he is to correct errors on sentence generation homework   01/16/23: Pt reported that he did some of his HEP. He wrote out a list of restaurants and completed a worksheet. Pt and wife endorsed that pt's communication has overall improved. Given frequent min to mod semantic and questioning cues, pt identified 3 similarities and differences between two target items (radio/tv, coffee/coke, Engineer, structural, Engineer, agricultural, piano/organ). SLP then presented pt with a pair of words and pt constructed 2 diverse sentence for 3 targets provided usual min to mod semantic cues. In narrative discourse, pt required usual verbal cues to provide clarification.  Pt reported that reading emails "is going a little better, but not much." He stated he reads many word inaccurately and he avoids reading emails because of this. HEP: Reading, sentence creation worksheet.   2/28/24[name] reports difficulty writing  and copying (homework was matching and copying sentence beginning and endings) Targeted written expression and word finding in divergent naming task - restaurants they frequent. Mahesh generated 13 restaurants with usual min to [employer]. Tedric required consistent mod to max  A to ID and correct errors in written words with consistent mod verbal and written cues. In narrative discourse telling how he met his wife, Brylan required usual questioning cues for clarification and verbal instructions for cohesion. After initial instructions, Philemon ID'd areas where he lacked cohesion. With usual verbal cues he used verbal compensations for word finding episodes.  01/04/23: To target sentence generation, word finding and reading comprehension  Pharmacist, hospital ([facility]) was utilized. The pt generated 3 subjects and objects for 3 verbs (cast, operate, decorate), for a total of 9 subject objects. Pt required usual mod semantic and questioning cues cues. Pt generated 3 complex sentences by answering "wh" questions. Pt required occasional min semantic cues to generate complex sentences. Targeted written expression and divergent naming in personally relevant category (army) he generated 9 army terms, Id'd written errors, however required mod A to self correct. They continue to complete all homework consistently. Provided information on [employer] and [employer] news with demonstration   01/02/23: Targeted verbal  compensations for aphasia, initially using Semantic Feature Analysis chart fading to structured task generating 3 salient descriptions for basic objects, progressing from photo to written word with occasional min questioning cues and semantic cues. Targeted word finding and auditory comprehension in convergent naming task. Garr named 10 items with rare repetition and required semantic cues and 1st letter cues for 3/10. Targeted divergent naming in personally relevant category (stock market) - Sayre named 15 words with extended time, usual mod semantic and questioning cues. Generated strategy to use written notes to make phone calls as needed (see patient instructions)    PATIENT EDUCATION: Education details: See patient instructions, see today's treatment, compensations for aphasia Person educated: Patient and Spouse Education method: Explanation, Verbal cues, and Handouts Education comprehension: verbal cues required and needs further education     GOALS: Goals reviewed with patient? Yes   SHORT TERM GOALS: Target date: 01/23/23   Pt will name 10 items in personally relevant categories over 3 sessions with occasional min A Baseline: Goal status: ONGOING   2.  Pt will write 4 item grocery list with occasional min A Baseline:  Goal status: MET   3.  Pt will respond to 3 texts with occasional min A over 1 week Baseline:  Goal status: ONGOING   4.  Pt will generate complex verbal sentences 3x using Pharmacist, hospital with rare min A over 2 sessions Baseline:  Goal status: MET   5.  Pt will comprehend 4 sentence passage/email with extended time and rare min A Baseline:  Goal status: ONGOING   6.  Pt will employ compensations for aphasia in structured language tasks with occasional min A Baseline:  Goal status: ONGOING   LONG TERM GOALS: Target date: 03/20/23   Pt will complete complex naming tasks with 80% accuracy and occasional min A Baseline:  Goal status:  ONGOING   2.  Pt will carryover verbal compensations for aphasia with occasional min A in conversation as needed over 2 sessions Baseline:  Goal status: ONGOING   3.  Pt will carryover 3 strategies to successfully communicate in community with clerks, servers etc Baseline:  Goal status: ONGOING   4.  Pt will write 3 sentence text or  email with occasional min A, external aids and occasional min A  Baseline:  Goal status: ONGOING   5.  Pt will improve score on Communicative Participation Item Bank by 4 points Baseline: 14/30 Goal status: ONGOING       ASSESSMENT:   CLINICAL IMPRESSION: Patient is a 82 y.o. male who was seen today for aphasia s/p CVA. He is accompanied by his spouse [name]. Prior to CVA, Gervon was active golfing, managing finances,  keeping up with the stock market and socializing with friends. Continued improvement in word finding, reading comprehension and written expression. Written errors persist at sentence level and he requires mod A to ID and correct written errors.l Narrative discourse requires mod A for cohesion and use of compensations for word finding.Ongoing training in compensations for aphasia and targeting written and verbal expression as well as reading comprehension. I recommend skilled ST to maximize verbal and written communication and cognition for safety, to reduce caregiver burden and QOL.    OBJECTIVE IMPAIRMENTS: include memory and aphasia. These impairments are limiting patient from managing medications, managing appointments, managing finances, household responsibilities, ADLs/IADLs, and effectively communicating at home and in community. Factors affecting potential to achieve goals and functional outcome are ability to learn/carryover information. Patient will benefit from skilled SLP services to address above impairments and improve overall function.   REHAB POTENTIAL: Good   PLAN:   SLP FREQUENCY: 2x/week   SLP DURATION: 12 weeks    PLANNED INTERVENTIONS: Language facilitation, Environmental controls, Cueing hierachy, Cognitive reorganization, Internal/external aids, Functional tasks, and Multimodal communication approach         Givanni Staron, Annye Rusk, CCC-SLP 01/18/2023, 11:58 AM

## 2023-01-23 ENCOUNTER — Ambulatory Visit: Payer: Medicare Other | Admitting: Speech Pathology

## 2023-01-23 ENCOUNTER — Ambulatory Visit: Payer: Medicare Other | Admitting: Physical Therapy

## 2023-01-23 ENCOUNTER — Encounter: Payer: Self-pay | Admitting: Speech Pathology

## 2023-01-23 VITALS — BP 128/75 | HR 58

## 2023-01-23 DIAGNOSIS — R278 Other lack of coordination: Secondary | ICD-10-CM | POA: Diagnosis not present

## 2023-01-23 DIAGNOSIS — R41842 Visuospatial deficit: Secondary | ICD-10-CM | POA: Diagnosis not present

## 2023-01-23 DIAGNOSIS — R2689 Other abnormalities of gait and mobility: Secondary | ICD-10-CM

## 2023-01-23 DIAGNOSIS — M6281 Muscle weakness (generalized): Secondary | ICD-10-CM | POA: Diagnosis not present

## 2023-01-23 DIAGNOSIS — R4701 Aphasia: Secondary | ICD-10-CM

## 2023-01-23 DIAGNOSIS — I69352 Hemiplegia and hemiparesis following cerebral infarction affecting left dominant side: Secondary | ICD-10-CM | POA: Diagnosis not present

## 2023-01-23 DIAGNOSIS — R41841 Cognitive communication deficit: Secondary | ICD-10-CM | POA: Diagnosis not present

## 2023-01-23 DIAGNOSIS — R29818 Other symptoms and signs involving the nervous system: Secondary | ICD-10-CM | POA: Diagnosis not present

## 2023-01-23 DIAGNOSIS — R2681 Unsteadiness on feet: Secondary | ICD-10-CM

## 2023-01-23 NOTE — Patient Instructions (Signed)
   [employer] News app - try some reading of short articles - you can pause the speech, read the article, then listen and read the article to see how much of it you got on your own  You can glance at the [place]

## 2023-01-23 NOTE — Therapy (Signed)
OUTPATIENT SPEECH LANGUAGE PATHOLOGY TREATMENT NOTE   Patient Name: James Estrada MRN: [id number] DOB:Jan 16, 1941, 82 y.o., male Today's Date: 01/23/2023  PCP: James Carol, MD REFERRING PROVIDER: [name], MD  END OF SESSION:   End of Session - 01/23/23 1408     Visit Number 8    Number of Visits 25    Date for SLP Re-Evaluation 03/20/23    SLP Start Time 12    SLP Stop Time  [id number]    SLP Time Calculation (min) 45 min    Activity Tolerance Patient tolerated treatment well             Past Medical History:  Diagnosis Date   AAA (abdominal aortic aneurysm) (James Estrada)    last u/s done 07/18/17    Aneurysm of left internal iliac artery (James Estrada) 05/22/2022   Arthritis    Bradycardia    CVA (cerebral vascular accident) (James Estrada) 10/04/2022   Dysrhythmia    frequent PAC, for 20 years   GERD (gastroesophageal reflux disease)    occ, OTC   Hemorrhoids    History of hiatal hernia    Hyperlipidemia    Hypertension    S/P aortic dissection repair 09/18/2022   Seasonal allergies    Type 1 dissection of thoracic aorta ([place]) 10/26/2022   Past Surgical History:  Procedure Laterality Date   ABDOMINAL AORTIC ENDOVASCULAR STENT GRAFT N/A 05/04/2020   Procedure: ABDOMINAL AORTIC ENDOVASCULAR STENT GRAFT;  Surgeon: James Posner, MD;  Location: [facility];  Service: Vascular;  Laterality: N/A;   CATARACT EXTRACTION Bilateral 2009   DIAGNOSTIC LAPAROSCOPY     laparoscopic hernia repair   EYE SURGERY Bilateral    cataract removal   HERNIA REPAIR Bilateral 1999, 2006   JOINT REPLACEMENT Right ~2018   hip replacement   pheochromocytoma  1993   PROSTATECTOMY N/A 05/15/2013   Procedure: PROSTATECTOMY RETROPUBIC; SIMPLE OPEN PROSTATECTOMY;  Surgeon: James Amass, MD;  Location: [facility];  Service: Urology;  Laterality: N/A;   REPAIR OF ACUTE ASCENDING THORACIC AORTIC DISSECTION N/A 09/17/2022   Procedure: REPAIR OF ACUTE ASCENDING THORACIC AORTIC DISSECTION USING 28 MM HEMASHIELD PLATINUM  VASCULAR GRAFT;  Surgeon: James Nakayama, MD;  Location: [facility];  Service: Vascular;  Laterality: N/A;  Median sternotomy   TOTAL ELBOW REPLACEMENT Left    > 30 years ago   TOTAL HIP ARTHROPLASTY Right 09/08/2017   Procedure: RIGHT TOTAL HIP ARTHROPLASTY ANTERIOR APPROACH;  Surgeon: James Rossetti, MD;  Location: [facility];  Service: Orthopedics;  Laterality: Right;   ULTRASOUND GUIDANCE FOR VASCULAR ACCESS Bilateral 05/04/2020   Procedure: ULTRASOUND GUIDANCE FOR VASCULAR ACCESS;  Surgeon: James Posner, MD;  Location: [facility];  Service: Vascular;  Laterality: Bilateral;   Patient Active Problem List   Diagnosis Date Noted   Type 1 dissection of thoracic aorta ([place]) 10/26/2022   CVA (cerebral vascular accident) ([place]) 10/04/2022   Sepsis with acute renal failure without septic shock ([place]) 09/30/2022   S/P aortic dissection repair 09/18/2022   Status post surgery 09/17/2022   Aneurysm of left internal iliac artery ([place]) 05/22/2022   Colon cancer screening 05/22/2022   Essential hypertension 05/22/2022   Hemorrhoids 05/22/2022   Inguinal hernia 05/22/2022   Irritable bowel syndrome 05/22/2022   Mixed hyperlipidemia 05/22/2022   Pure hypercholesterolemia 05/22/2022   Thrombocytopenia ([place]) 05/22/2022   Allergic rhinitis 05/22/2022   AAA (abdominal aortic aneurysm) without rupture ([place]) 05/04/2020   Trochanteric bursitis, right hip 08/20/2018   History of right hip  replacement 08/20/2018   Status post total replacement of right hip 09/08/2017   Pain of right hip joint 08/07/2017   Unilateral primary osteoarthritis, right hip 08/07/2017   BPH (benign prostatic hypertrophy) with urinary obstruction 05/15/2013    ONSET DATE: 09/17/22  REFERRING DIAG: R13.10 (ICD-10-CM) - Dysphagia, unspecified  THERAPY DIAG:  Aphasia  Rationale for Evaluation and Treatment: Rehabilitation  SUBJECTIVE:   SUBJECTIVE STATEMENT: "We did the app"  PAIN:  Are you having pain?  No   OBJECTIVE:  Pt is a 82 y/o M admitted to [facility] on 09/17/22 with type 1 aortic dissection requiring emergent repair. Head CT 11/6 acute infarcts within R MCA involvement, including R cerebellum, basal ganglia, frontal lobe. ETT 11/4-11/9. PMH AAA s/p endovascular repair (2020), HTN, HLD, GERD.   TODAY'S TREATMENT:                                                                                                                                         DATE:   01/23/23: James Estrada used dictation to send 5 texts to family members with success. He sent a longer more detailed text to his son re: a problem which he reported was vague and less accurate, however his son did understand the problem. Targeted word finding and written expression generating 10 items in category - James Estrada used siri to aid in spelling as needed independently. With occasional min semantic cues James Estrada names/wrote 10/10 words. Targeted reading comprehension reading 4 phrase-sentence steps in social tasks and placing them in sequential order - with extended time and rare min A, James Estrada comprehended 4/4 4-step sequences. In conversation re: his boat, James Estrada used verbal compensations for aphasia 2x as needed ("the place where everyone ties up their boat/marina, the club my father was a member - river Marketing executive). With written cues, James Estrada named 5 boating/nautical words.   3/6/24[name] completed HW - generating sentence with 2 words. Targeted divergent naming in moderately complex categories and written expression at the word level James Estrada generated 6 items in each category independently and 8 for each with semantic cues for the remaining 2 (goal for this was 8 - items in deli and items in bakery as he did grocery shopping before the stroke). James Estrada generated (verbally) 6 ingredients for shrimp scampi (a dish he makes) with extended time and rare min A To target complex sentence generation, word finding, reading comprehension  Research officer, political party ([employer]) was utilized. The pt generated 3 subjects and objects for 2 verbs (pull, scratch), for a total of 6 subject objects. Pt required occasional min semantic cues. Pt generated 2 complex sentences by answering "wh" questions. Pt required extended time and rare min  cues to generate complex sentences. James Estrada is to text 3 family members, using [employer] if he needs help spelling or having [name] check his texts an he is to correct errors on sentence generation homework  01/16/23: Pt reported that he did some of his HEP. He wrote out a list of restaurants and completed a worksheet. Pt and wife endorsed that pt's communication has overall improved. Given frequent min to mod semantic and questioning cues, pt identified 3 similarities and differences between two target items (radio/tv, coffee/coke, Engineer, structural, IT trainer, piano/organ). SLP then presented pt with a pair of words and pt constructed 2 diverse sentence for 3 targets provided usual min to mod semantic cues. In narrative discourse, pt required usual verbal cues to provide clarification.  Pt reported that reading emails "is going a little better, but not much." He stated he reads many word inaccurately and he avoids reading emails because of this. HEP: Reading, sentence creation worksheet.   01/11/23: James Estrada reports difficulty writing and copying (homework was matching and copying sentence beginning and endings) Targeted written expression and word finding in divergent naming task - restaurants they frequent. James Estrada generated 13 restaurants with usual min to [employer]. Tyjuan required consistent mod to max  A to ID and correct errors in written words with consistent mod verbal and written cues. In narrative discourse telling how he met his wife, Kirklin required usual questioning cues for clarification and verbal instructions for cohesion. After initial instructions, James Estrada ID'd areas where he lacked cohesion. With usual  verbal cues he used verbal compensations for word finding episodes.  01/04/23: To target sentence generation, word finding and reading comprehension  Pharmacist, hospital ([facility]) was utilized. The pt generated 3 subjects and objects for 3 verbs (cast, operate, decorate), for a total of 9 subject objects. Pt required usual mod semantic and questioning cues cues. Pt generated 3 complex sentences by answering "wh" questions. Pt required occasional min semantic cues to generate complex sentences. Targeted written expression and divergent naming in personally relevant category (army) he generated 9 army terms, Id'd written errors, however required mod A to self correct. They continue to complete all homework consistently. Provided information on [employer] and [employer] news with demonstration   01/02/23: Targeted verbal compensations for aphasia, initially using Semantic Feature Analysis chart fading to structured task generating 3 salient descriptions for basic objects, progressing from photo to written word with occasional min questioning cues and semantic cues. Targeted word finding and auditory comprehension in convergent naming task. James Estrada named 10 items with rare repetition and required semantic cues and 1st letter cues for 3/10. Targeted divergent naming in personally relevant category (stock market) - James Estrada named 15 words with extended time, usual mod semantic and questioning cues. Generated strategy to use written notes to make phone calls as needed (see patient instructions)    PATIENT EDUCATION: Education details: See patient instructions, see today's treatment, compensations for aphasia Person educated: Patient and Spouse Education method: Explanation, Verbal cues, and Handouts Education comprehension: verbal cues required and needs further education     GOALS: Goals reviewed with patient? Yes   SHORT TERM GOALS: Target date: 01/23/23   Pt will name 10 items in personally  relevant categories over 3 sessions with occasional min A Baseline: Goal status: MET   2.  Pt will write 4 item grocery list with occasional min A Baseline:  Goal status: MET   3.  Pt will respond to 3 texts with occasional min A over 1 week Baseline:  Goal status: MET   4.  Pt will generate complex verbal sentences 3x using Pharmacist, hospital with rare min A over 2 sessions Baseline:  Goal status: MET  5.  Pt will comprehend 4 sentence passage/email with extended time and rare min A Baseline:  Goal status: MET   6.  Pt will employ compensations for aphasia in structured language tasks with occasional min A Baseline:  Goal status: MET   LONG TERM GOALS: Target date: 03/20/23   Pt will complete complex naming tasks with 80% accuracy and occasional min A Baseline:  Goal status: ONGOING   2.  Pt will carryover verbal compensations for aphasia with occasional min A in conversation as needed over 2 sessions Baseline:  Goal status: ONGOING   3.  Pt will carryover 3 strategies to successfully communicate in community with clerks, servers etc Baseline:  Goal status: ONGOING   4.  Pt will write 3 sentence text or email with occasional min A, external aids and occasional min A  Baseline:  Goal status: ONGOING   5.  Pt will improve score on Communicative Participation Item Bank by 4 points Baseline: 14/30 Goal status: ONGOING       ASSESSMENT:   CLINICAL IMPRESSION: Patient is a 82 y.o. male who was seen today for aphasia s/p CVA. He is accompanied by his spouse [name]. Prior to CVA, Gladys was active golfing, managing finances,  keeping up with the stock market and socializing with friends. Continued improvement in word finding, reading comprehension and written expression. Written errors persist at sentence level and he requires mod A to ID and correct written errors.l Narrative discourse requires occasional min A for cohesion and use of compensations for word  finding.Ongoing training in compensations for aphasia and targeting written and verbal expression as well as reading comprehension. Short term goals met. I recommend skilled ST to maximize verbal and written communication and cognition for safety, to reduce caregiver burden and QOL.    OBJECTIVE IMPAIRMENTS: include memory and aphasia. These impairments are limiting patient from managing medications, managing appointments, managing finances, household responsibilities, ADLs/IADLs, and effectively communicating at home and in community. Factors affecting potential to achieve goals and functional outcome are ability to learn/carryover information. Patient will benefit from skilled SLP services to address above impairments and improve overall function.   REHAB POTENTIAL: Good   PLAN:   SLP FREQUENCY: 2x/week   SLP DURATION: 12 weeks   PLANNED INTERVENTIONS: Language facilitation, Environmental controls, Cueing hierachy, Cognitive reorganization, Internal/external aids, Functional tasks, and Multimodal communication approach         Jodine Muchmore, Annye Rusk, CCC-SLP 01/23/2023, 2:55 PM

## 2023-01-23 NOTE — Therapy (Signed)
OUTPATIENT PHYSICAL THERAPY NEURO TREATMENT   Patient Name: James Estrada MRN: [id number] DOB:05-Apr-1941, 82 y.o., male Today's Date: 01/23/2023   PCP: [name], MD REFERRING PROVIDER: Jolaine Artist, MD  END OF SESSION:  PT End of Session - 01/23/23 1320     Visit Number 5    Number of Visits 17    Date for PT Re-Evaluation 03/05/23    Authorization Type BCBS Medicare (needs 10th visit PN)    Progress Note Due on Visit 10    PT Start Time 1319    PT Stop Time 1401    PT Time Calculation (min) 42 min    Equipment Utilized During Treatment Gait belt    Activity Tolerance Patient tolerated treatment well    Behavior During Therapy WFL for tasks assessed/performed              Past Medical History:  Diagnosis Date   AAA (abdominal aortic aneurysm) ([place])    last u/s done 07/18/17    Aneurysm of left internal iliac artery ([place]) 05/22/2022   Arthritis    Bradycardia    CVA (cerebral vascular accident) ([place]) 10/04/2022   Dysrhythmia    frequent PAC, for 20 years   GERD (gastroesophageal reflux disease)    occ, OTC   Hemorrhoids    History of hiatal hernia    Hyperlipidemia    Hypertension    S/P aortic dissection repair 09/18/2022   Seasonal allergies    Type 1 dissection of thoracic aorta ([place]) 10/26/2022   Past Surgical History:  Procedure Laterality Date   ABDOMINAL AORTIC ENDOVASCULAR STENT GRAFT N/A 05/04/2020   Procedure: ABDOMINAL AORTIC ENDOVASCULAR STENT GRAFT;  Surgeon: [name], MD;  Location: [facility];  Service: Vascular;  Laterality: N/A;   CATARACT EXTRACTION Bilateral 2009   DIAGNOSTIC LAPAROSCOPY     laparoscopic hernia repair   EYE SURGERY Bilateral    cataract removal   HERNIA REPAIR Bilateral 1999, 2006   JOINT REPLACEMENT Right ~2018   hip replacement   pheochromocytoma  1993   PROSTATECTOMY N/A 05/15/2013   Procedure: PROSTATECTOMY RETROPUBIC; SIMPLE OPEN PROSTATECTOMY;  Surgeon: [name], MD;  Location[facility];   Service: Urology;  Laterality: N/A;   REPAIR OF ACUTE ASCENDING THORACIC AORTIC DISSECTION N/A 09/17/2022   Procedure: REPAIR OF ACUTE ASCENDING THORACIC AORTIC DISSECTION USING 28 MM HEMASHIELD PLATINUM VASCULAR GRAFT;  Surgeon: [name], MD;  Location: [facility];  Service: Vascular;  Laterality: N/A;  Median sternotomy   TOTAL ELBOW REPLACEMENT Left    > 30 years ago   TOTAL HIP ARTHROPLASTY Right 09/08/2017   Procedure: RIGHT TOTAL HIP ARTHROPLASTY ANTERIOR APPROACH;  Surgeon: [name], MD;  [facility];  Service: Orthopedics;  Laterality: Right;   ULTRASOUND GUIDANCE FOR VASCULAR ACCESS Bilateral 05/04/2020   Procedure: ULTRASOUND GUIDANCE FOR VASCULAR ACCESS;  Surgeon: [name], MD;  Location: [facility];  Service: Vascular;  Laterality: Bilateral;   Patient Active Problem List   Diagnosis Date Noted   Type 1 dissection of thoracic aorta ([place]) 10/26/2022   CVA (cerebral vascular accident) ([place]) 10/04/2022   Sepsis with acute renal failure without septic shock ([place]) 09/30/2022   S/P aortic dissection repair 09/18/2022   Status post surgery 09/17/2022   Aneurysm of left internal iliac artery ([place]) 05/22/2022   Colon cancer screening 05/22/2022   Essential hypertension 05/22/2022   Hemorrhoids 05/22/2022   Inguinal hernia 05/22/2022   Irritable bowel syndrome 05/22/2022   Mixed  hyperlipidemia 05/22/2022   Pure hypercholesterolemia 05/22/2022   Thrombocytopenia ([place]) 05/22/2022   Allergic rhinitis 05/22/2022   AAA (abdominal aortic aneurysm) without rupture ([place]) 05/04/2020   Trochanteric bursitis, right hip 08/20/2018   History of right hip replacement 08/20/2018   Status post total replacement of right hip 09/08/2017   Pain of right hip joint 08/07/2017   Unilateral primary osteoarthritis, right hip 08/07/2017   BPH (benign prostatic hypertrophy) with urinary obstruction 05/15/2013    ONSET DATE: 12/28/2022 (CVA on 09/17/22)  REFERRING DIAG: I63.19  (ICD-10-CM) - Cerebrovascular accident (CVA) due to embolism of other precerebral artery ([place])  THERAPY DIAG:  Unsteadiness on feet  Other lack of coordination  Other abnormalities of gait and mobility  Rationale for Evaluation and Treatment: Rehabilitation  SUBJECTIVE:                                                                                                                                                                                             SUBJECTIVE STATEMENT: Pt reports he is doing well, denies pain or falls.  HEP is going well. L ankle is still swollen, compression and elevation has not helped.    Pt accompanied by:  Wife, [name]   PERTINENT HISTORY:  Pt is a 82 y/o M admitted to [facility] on 09/17/22 with type 1 aortic dissection requiring emergent repair. Head CT 11/6 acute infarcts within R MCA involvement, including R cerebellum, basal ganglia, frontal lobe. ETT 11/4-11/9. PMH AAA s/p endovascular repair (2020), HTN, HLD, GERD. Hospitalized 09/18/23 to 10/21/23.Had [place], PT, SLP.    PAIN:  Are you having pain? No  VITALS Vitals:   01/23/23 1323  BP: 128/75  Pulse: (!) 58      PRECAUTIONS: Fall  OBJECTIVE:  TODAY'S TREATMENT:    NMR STAIRS: Level of Assistance: SBA Stair Negotiation Technique: Alternating Pattern  with Bilateral Rails Number of Stairs: 4  Height of Stairs: 6"  Comments: Pt reports having difficulty with stairs due to not having handrails on either side at home for 3. Pt demonstrated good step-though pattern when using rail but reports performing step-to pattern without rail. Pt requesting to work on strength on steps for session today   In // bars for improved BLE strength, single leg stability, posture and lateral weight shifting:  Alt fwd eccentric heel taps from 6" step without UE support, x7 per side. Pt w/increased difficulty stepping w/LLE > RLE, unclear if due to R sided weakness or reduced weight shift to R side. CGA for safety,  On  rockerboard in L/R direction, standing without UE support using mirror for visual biofeedback on body position.  Noted significant pelvic tilt to L side w/R truncal lean compensation, but pt able to stabilize board well. Pt also able to perform lateral weight shifts, but was more difficult to shift to R >L.  On bosu black side up, standing unsupported x2 minutes for improved ankle strategy and midline orientation. Pt initially demonstrated significant BLE tremors but w/verbal cues to relax, was able to stabilize well. Noted pt stood staggered on board (RLE ahead of LLE) despite cues for proper foot position. Progressed to mini squats, x10, but pt not comfortable with continuing exercise as he felt it was too "progressive".    GAIT: Gait pattern: step through pattern, Left foot flat, trendelenburg, lateral lean- Left, and wide BOS Distance walked: Various clinic distances  Assistive device utilized: None Level of assistance: SBA and CGA Comments:  Left inattention still present today as pt walked around mat on L side to side on the R side despite max verbal cues   PATIENT EDUCATION: Education details:  Continue HEP, importance of weight shifting for balance  Person educated: Patient and Spouse Education method: Explanation and Verbal cues Education comprehension: verbalized understanding and needs further education  HOME EXERCISE PROGRAM: Access Code: [email or web address] URL: [email or web address] Date: 01/16/2023 Prepared by: Elease Etienne  Exercises - Standing Hamstring Curl with Resistance  - 1 x daily - 5 x weekly - 2 sets - 12 reps - Corner Balance Feet Together With Eyes Closed  - 1 x daily - 5 x weekly - 1 sets - 2 reps - 30-45 seconds hold - Standing Tandem Balance with Counter Support  - 1 x daily - 5 x weekly - 1 sets - 2 reps - 30-45 seconds hold - Heel Raises with Counter Support  - 1 x daily - 5 x weekly - 1-2 sets - 12 reps  GOALS: Goals reviewed with patient?  Yes  SHORT TERM GOALS: Target date: 02/02/23  Pt will be IND with initial HEP in order to indicate improved functional mobility and dec fall risk. Baseline: Goal status: INITIAL  2.  Pt will ambulate 150' without AD mod I level in order to indicate more independent household ambulation.   Baseline:  Goal status: INITIAL  3.  Pt will improve FGA to >/=23/30 in order to indicate dec fall risk.  Baseline: 19/30 Goal status: INITIAL  4.  Pt will maintain gait speed to >/=3.0 ft/sec w/o AD while scanning environment without overt LOB in order to indicate dec fall risk.  Baseline:  Goal status: INITIAL  5.  Will assess MCTSIB and update goals to reflect progress.  Baseline: Pt scored 120/120 at baseline  Goal status: MET  6.  Pt will negotiate up/down 4 steps with single rail in alt pattern, up/down ramp and curb and ambulate x 500' outdoors over unlevel paved surfaces at S level without AD in order to indicate improved community mobility.   Baseline:  Goal status: INITIAL  LONG TERM GOALS: Target date: 03/05/23  Pt will be IND with final HEP in order to indicate improved functional mobility and dec fall risk. Baseline:  Goal status: INITIAL  2.  Pt will improve FGA to >/=26/30 in order to indicate dec fall risk.   Baseline: 19/30 Goal status: INITIAL  3.  Will add LTG for MTCSIB once assessed.  Baseline: pt scored 120/120 at baseline Goal status: MET  4.  Pt will negotiate up/down 8 steps with single rail, up/down ramp and curb and ambulate x 1000' outdoors over unlevel paved surfaces without AD  at mod I level while scanning environment in order to indicate improved community mobility.   Baseline:  Goal status: INITIAL    ASSESSMENT:  CLINICAL IMPRESSION: Emphasis of skilled PT session on reactive balance strategies, lateral weight shifting and stairs navigation. Pt w/continued L inattention today, walking past the mat table on L side despite max cues. Pt demonstrates  asymmetry w/pelvic position in sitting and standing, likely contributing to decreased weight shift to R side w/activity. Pt will benefit from continued therapy to address L inattention, balance deficits and asymmetry w/weight shift. Continue POC.   OBJECTIVE IMPAIRMENTS: Abnormal gait, cardiopulmonary status limiting activity, decreased activity tolerance, decreased balance, decreased endurance, decreased mobility, decreased strength, and impaired perceived functional ability.   ACTIVITY LIMITATIONS: carrying, lifting, standing, squatting, stairs, bathing, and locomotion level  PARTICIPATION LIMITATIONS: meal prep, medication management, personal finances, driving, shopping, community activity, and yard work  PERSONAL FACTORS: Age and 3+ comorbidities: see above  are also affecting patient's functional outcome.   REHAB POTENTIAL: Good  CLINICAL DECISION MAKING: Evolving/moderate complexity  EVALUATION COMPLEXITY: Moderate  PLAN:  PT FREQUENCY: 2x/week  PT DURATION: 8 weeks  PLANNED INTERVENTIONS: Therapeutic exercises, Therapeutic activity, Neuromuscular re-education, Balance training, Gait training, Patient/Family education, Self Care, and Joint mobilization  PLAN FOR NEXT SESSION:  Add to HEP to include return to gym activities and maybe ankle strategy, gait without device, outdoor gait, dual tasking, high-level balance, left scanning w/ dynamic tasks, elliptical again-try small bursts, gait w/ dual task, fwd/backward ambulation w/ ball toss, obstacle course, rebounder drills, blaze pods in // bars, cone stack and reach  Charlett Nose, PT, [place] [address] [place], [place]  [address] Phone:  [phone] Fax:  [phone] 01/23/23, 2:01 PM

## 2023-01-24 NOTE — Therapy (Signed)
OUTPATIENT OCCUPATIONAL THERAPY NEURO TREATMENT NOTE   Patient Name: James Estrada MRN: [id number] DOB:[birth year], 82 y.o., male Today's Date: 01/16/2023  PCP: [name], MD  REFERRING PROVIDER: Jolaine Artist, MD  END OF SESSION:  OT End of Session - 01/16/23 1452     Visit Number 1    Number of Visits 13    Date for OT Re-Evaluation 04/14/23    Authorization Type BCBS - no auth required    Progress Note Due on Visit 10    OT Start Time 1451    OT Stop Time 1534    OT Time Calculation (min) 43 min    Activity Tolerance Patient tolerated treatment well    Behavior During Therapy [facility] for tasks assessed/performed             Past Medical History:  Diagnosis Date   AAA (abdominal aortic aneurysm) ([place])    last u/s done 07/18/17    Aneurysm of left internal iliac artery ([place]) 05/22/2022   Arthritis    Bradycardia    CVA (cerebral vascular accident) ([place]) 10/04/2022   Dysrhythmia    frequent PAC, for 20 years   GERD (gastroesophageal reflux disease)    occ, OTC   Hemorrhoids    History of hiatal hernia    Hyperlipidemia    Hypertension    S/P aortic dissection repair 09/18/2022   Seasonal allergies    Type 1 dissection of thoracic aorta ([place]) 10/26/2022   Past Surgical History:  Procedure Laterality Date   ABDOMINAL AORTIC ENDOVASCULAR STENT GRAFT N/A 05/04/2020   Procedure: ABDOMINAL AORTIC ENDOVASCULAR STENT GRAFT;  Surgeon: [name], MD;  Location: [facility];  Service: Vascular;  Laterality: N/A;   CATARACT EXTRACTION Bilateral 2009   DIAGNOSTIC LAPAROSCOPY     laparoscopic hernia repair   EYE SURGERY Bilateral    cataract removal   HERNIA REPAIR Bilateral 1999, 2006   JOINT REPLACEMENT Right ~2018   hip replacement   pheochromocytoma  1993   PROSTATECTOMY N/A 05/15/2013   Procedure: PROSTATECTOMY RETROPUBIC; SIMPLE OPEN PROSTATECTOMY;  Surgeon: [name], MD;  [facility];  Service: Urology;  Laterality: N/A;   REPAIR OF ACUTE ASCENDING  THORACIC AORTIC DISSECTION N/A 09/17/2022   Procedure: REPAIR OF ACUTE ASCENDING THORACIC AORTIC DISSECTION USING 28 MM HEMASHIELD PLATINUM VASCULAR GRAFT;  Surgeon: [name], MD;  Location: [facility];  Service: Vascular;  Laterality: N/A;  Median sternotomy   TOTAL ELBOW REPLACEMENT Left    > 30 years ago   TOTAL HIP ARTHROPLASTY Right 09/08/2017   Procedure: RIGHT TOTAL HIP ARTHROPLASTY ANTERIOR APPROACH;  Surgeon: [name], MD;  [facility];  Service: Orthopedics;  Laterality: Right;   ULTRASOUND GUIDANCE FOR VASCULAR ACCESS Bilateral 05/04/2020   Procedure: ULTRASOUND GUIDANCE FOR VASCULAR ACCESS;  Surgeon: [name], MD;  Location[facility];  Service: Vascular;  Laterality: Bilateral;   Patient Active Problem List   Diagnosis Date Noted   Type 1 dissection of thoracic aorta ([place]) 10/26/2022   CVA (cerebral vascular accident) ([place]) 10/04/2022   Sepsis with acute renal failure without septic shock ([place]) 09/30/2022   S/P aortic dissection repair 09/18/2022   Status post surgery 09/17/2022   Aneurysm of left internal iliac artery ([place]) 05/22/2022   Colon cancer screening 05/22/2022   Essential hypertension 05/22/2022   Hemorrhoids 05/22/2022   Inguinal hernia 05/22/2022   Irritable bowel syndrome 05/22/2022   Mixed hyperlipidemia 05/22/2022   Pure hypercholesterolemia 05/22/2022   Thrombocytopenia (  [place]) 05/22/2022   Allergic rhinitis 05/22/2022   AAA (abdominal aortic aneurysm) without rupture ([place]) 05/04/2020   Trochanteric bursitis, right hip 08/20/2018   History of right hip replacement 08/20/2018   Status post total replacement of right hip 09/08/2017   Pain of right hip joint 08/07/2017   Unilateral primary osteoarthritis, right hip 08/07/2017   BPH (benign prostatic hypertrophy) with urinary obstruction 05/15/2013    ONSET DATE: 12/28/2022 (CVA on 09/17/22)   REFERRING DIAG: I63.19 (ICD-10-CM) - Cerebrovascular accident (CVA) due to embolism of  other precerebral artery  THERAPY DIAG:  Muscle weakness (generalized)  Other lack of coordination  Visuospatial deficit  Other symptoms and signs involving the nervous system  Rationale for Evaluation and Treatment: Rehabilitation  PERTINENT HISTORY:  "Pt is a 82 y/o M admitted to [facility] on 09/17/22 with type 1 aortic dissection requiring emergent repair. Head CT 11/6 acute infarcts within R MCA involvement, including R cerebellum, basal ganglia, frontal lobe. ETT 11/4-11/9. PMH AAA s/p endovascular repair (2020), HTN, HLD, GERD. Hosoitalized 09/18/23 to 10/21/23.Had [place], PT, SLP."   Has had limited L supination since elbow fracture prior to CVA. He has been using block letters for writing but is highly motivated to work on his handwriting in therapy.    PRECAUTIONS: Fall;  WEIGHT BEARING RESTRICTIONS: No   SUBJECTIVE:   SUBJECTIVE STATEMENT: He states ***.   Pt accompanied by: significant other- Betty   PAIN:  Are you having pain? No ***  FALLS: Has patient fallen in last 6 months? Yes. Number of falls 1  LIVING ENVIRONMENT: Lives with: lives with their family Lives in: House/apartment (lives at daughters house-will be moving to [place] in April/May to single home).  Stairs: Yes: Internal: flight but doesn't use steps; on left going up and External: 2 steps; none Has following equipment at home: Single point cane, shower chair, Grab bars, and tub/shower    PLOF: Independent, retired Designer, fashion/clothing (lots of computer use), golf, went to the gym on a daily basis, was an avid gardener; driving   PATIENT GOALS: return to PLOF    OBJECTIVE:  (All objective assessments below are from initial evaluation on: 01/16/23 unless otherwise specified.)   HAND DOMINANCE: Left  ADLs: Overall ADLs: mostly mod I with assistance in cutting up meat  IADLs: Shopping: max A Meal Prep: liked to cook and bake - has been able to make some meals with min A Community mobility:  dependent Medication management: dependent Financial management: dependent Handwriting: 25% legible  MOBILITY STATUS: Needs Assist: SPC for community mobility  ACTIVITY TOLERANCE: Activity tolerance: Fair  FUNCTIONAL OUTCOME MEASURES: FOTO: to be completed at a later date  UPPER EXTREMITY ROM:     AROM Right (eval) Left (eval)  Shoulder flexion WNL WNL  Shoulder abduction WNL WNL  Elbow flexion WNL WNL  Elbow extension WNL WNL  Wrist flexion WNL WNL  Wrist extension WNL WNL  Wrist pronation WNL WNL  Wrist supination WNL Impaired (chronic)   Digit Composite Flexion WNL WNL  Digit Composite Extension WNL WNL  Digit Opposition WNL WNL  (Blank rows = not tested)  UPPER EXTREMITY MMT:     MMT Right (eval) Left (eval)  Shoulder flexion WNL WFL  Shoulder abduction WNL WFL  Elbow flexion WNL WFL  Elbow extension WNL WFL  (Blank rows = not tested)  HAND FUNCTION: Grip strength: Right: 72.9 lbs; Left: 41 lbs  COORDINATION: Finger Nose Finger test: impaired on L 9 Hole Peg test: Right: 33.5  sec; Left: 44.6 sec  MUSCLE TONE: LUE: Within functional limits  COGNITION: Overall cognitive status: Impaired and See ST note for further details  VISION: Subjective report: some boughts of double vision Baseline vision: Wears glasses for reading only and OTC readers Visual history: cataracts  VISION ASSESSMENT: Tracking/Visual pursuits: Decreased smoothness with horizontal tracking Visual Fields: Left visual field deficits  PERCEPTION: Impaired: Inattention/neglect: difficulty discriminating R vs L  PRAXIS: WFL  OBSERVATIONS: Pt appears well kept and uses SPC for mobility. No LOB noted. Difficulty with recall noted requiring assistance from wife though in general able to answer questions appropriately.    TODAY'S TREATMENT:                                                                                                                               01/25/23: ***  initiate diplopia HEP, visual strategies, continue to assess vision and L inattention  Eval only this date   PATIENT EDUCATION: Education details: OT role and POC Person educated: Patient and Spouse Education method: Explanation Education comprehension: verbalized understanding  HOME EXERCISE PROGRAM: ***   GOALS:  SHORT TERM GOALS: Target date: 02/13/2023    Patient will demonstrate LUE HEP with 25% verbal cues or less for proper execution. Baseline: Goal status: INITIAL  2.  Patient will independently recall at least 2 compensatory strategies for visual impairment without cueing. Baseline:  Goal status: INITIAL  3.  Pt will independently recall at least 1 strategy for L inattention.  Baseline:  Goal status: INITIAL  LONG TERM GOALS: Target date: 04/14/2023  Patient will demonstrate updated LUE HEP with 25% verbal cues or less for proper execution. Baseline:  Goal status: INITIAL  2.  Pt will complete FOTO at time of discharge.  Baseline:  Goal status: INITIAL  3.  Patient will complete nine-hole peg with use of L in 35 seconds or less. Baseline: 44.6 seconds Goal status: INITIAL  4.  Patient will demonstrate at least 55 lbs L grip strength as needed to open jars and other containers. Baseline: 41 lbs Goal status: INITIAL  5.  Pt will report improved writing legibility with use of L and AD as needed to at least 75% Baseline: 25% legibility Goal status: INITIAL   ASSESSMENT:  CLINICAL IMPRESSION: 01/25/23: ***  Eval: Patient is a 82 y.o. male who was seen today for occupational therapy evaluation following CVA. Hx includes multiple joint replacements and abdominal aortic aneurysm status post stent graft placement July, 2021. Patient currently presents below baseline level of functioning demonstrating functional deficits and impairments as noted below. Pt would benefit from skilled OT services in the outpatient setting to work on impairments as noted below to  help pt return to PLOF as able.    PERFORMANCE DEFICITS: in functional skills including ADLs, IADLs, coordination, proprioception, ROM, strength, pain, Fine motor control, mobility, endurance, decreased knowledge of precautions, vision, and UE functional use.    PLAN:  OT FREQUENCY: 2x/week  OT DURATION: 12 weeks  PLANNED INTERVENTIONS: self care/ADL training, therapeutic exercise, therapeutic activity, neuromuscular re-education, functional mobility training, electrical stimulation, ultrasound, moist heat, patient/family education, cognitive remediation/compensation, visual/perceptual remediation/compensation, DME and/or AE instructions, and Re-evaluation  CONSULTED AND AGREED WITH PLAN OF CARE: Patient and family member/caregiver  PLAN FOR NEXT SESSION:  Dennis Bast, OT 01/16/2023, 5:26 PM

## 2023-01-25 ENCOUNTER — Ambulatory Visit: Payer: Medicare Other | Admitting: Speech Pathology

## 2023-01-25 ENCOUNTER — Ambulatory Visit: Payer: Medicare Other | Admitting: Physical Therapy

## 2023-01-25 ENCOUNTER — Ambulatory Visit: Payer: Medicare Other | Admitting: Rehabilitative and Restorative Service Providers"

## 2023-01-25 ENCOUNTER — Encounter: Payer: Self-pay | Admitting: Rehabilitative and Restorative Service Providers"

## 2023-01-25 ENCOUNTER — Encounter: Payer: Self-pay | Admitting: Speech Pathology

## 2023-01-25 DIAGNOSIS — R41842 Visuospatial deficit: Secondary | ICD-10-CM | POA: Diagnosis not present

## 2023-01-25 DIAGNOSIS — R2689 Other abnormalities of gait and mobility: Secondary | ICD-10-CM

## 2023-01-25 DIAGNOSIS — R4701 Aphasia: Secondary | ICD-10-CM | POA: Diagnosis not present

## 2023-01-25 DIAGNOSIS — R2681 Unsteadiness on feet: Secondary | ICD-10-CM | POA: Diagnosis not present

## 2023-01-25 DIAGNOSIS — M6281 Muscle weakness (generalized): Secondary | ICD-10-CM | POA: Diagnosis not present

## 2023-01-25 DIAGNOSIS — R278 Other lack of coordination: Secondary | ICD-10-CM | POA: Diagnosis not present

## 2023-01-25 DIAGNOSIS — R41841 Cognitive communication deficit: Secondary | ICD-10-CM | POA: Diagnosis not present

## 2023-01-25 DIAGNOSIS — I69352 Hemiplegia and hemiparesis following cerebral infarction affecting left dominant side: Secondary | ICD-10-CM | POA: Diagnosis not present

## 2023-01-25 DIAGNOSIS — R29818 Other symptoms and signs involving the nervous system: Secondary | ICD-10-CM

## 2023-01-25 NOTE — Therapy (Signed)
OUTPATIENT PHYSICAL THERAPY NEURO TREATMENT   Patient Name: James Estrada MRN: [id number] DOB:February 17, 1941, 82 y.o., male Today's Date: 01/25/2023   PCP: [name], MD REFERRING PROVIDER: Jolaine Artist, MD  END OF SESSION:  PT End of Session - 01/25/23 1320     Visit Number 6    Number of Visits 17    Date for PT Re-Evaluation 03/05/23    Authorization Type BCBS Medicare (needs 10th visit PN)    Progress Note Due on Visit 10    PT Start Time 1318    PT Stop Time 1400    PT Time Calculation (min) 42 min    Equipment Utilized During Treatment Gait belt    Activity Tolerance Patient tolerated treatment well    Behavior During Therapy WFL for tasks assessed/performed              Past Medical History:  Diagnosis Date   AAA (abdominal aortic aneurysm) ([place])    last u/s done 07/18/17    Aneurysm of left internal iliac artery ([facility]) 05/22/2022   Arthritis    Bradycardia    CVA (cerebral vascular accident) ([place]) 10/04/2022   Dysrhythmia    frequent PAC, for 20 years   GERD (gastroesophageal reflux disease)    occ, OTC   Hemorrhoids    History of hiatal hernia    Hyperlipidemia    Hypertension    S/P aortic dissection repair 09/18/2022   Seasonal allergies    Type 1 dissection of thoracic aorta ([place]) 10/26/2022   Past Surgical History:  Procedure Laterality Date   ABDOMINAL AORTIC ENDOVASCULAR STENT GRAFT N/A 05/04/2020   Procedure: ABDOMINAL AORTIC ENDOVASCULAR STENT GRAFT;  Surgeon: [name], MD;  Location: [facility];  Service: Vascular;  Laterality: N/A;   CATARACT EXTRACTION Bilateral 2009   DIAGNOSTIC LAPAROSCOPY     laparoscopic hernia repair   EYE SURGERY Bilateral    cataract removal   HERNIA REPAIR Bilateral 1999, 2006   JOINT REPLACEMENT Right ~2018   hip replacement   pheochromocytoma  1993   PROSTATECTOMY N/A 05/15/2013   Procedure: PROSTATECTOMY RETROPUBIC; SIMPLE OPEN PROSTATECTOMY;  Surgeon: [name], MD;  Location: [facility];   Service: Urology;  Laterality: N/A;   REPAIR OF ACUTE ASCENDING THORACIC AORTIC DISSECTION N/A 09/17/2022   Procedure: REPAIR OF ACUTE ASCENDING THORACIC AORTIC DISSECTION USING 28 MM HEMASHIELD PLATINUM VASCULAR GRAFT;  Surgeon: [name], MD;  Location: [facility];  Service: Vascular;  Laterality: N/A;  Median sternotomy   TOTAL ELBOW REPLACEMENT Left    > 30 years ago   TOTAL HIP ARTHROPLASTY Right 09/08/2017   Procedure: RIGHT TOTAL HIP ARTHROPLASTY ANTERIOR APPROACH;  Surgeon: [name], MD;  Location: [facility];  Service: Orthopedics;  Laterality: Right;   ULTRASOUND GUIDANCE FOR VASCULAR ACCESS Bilateral 05/04/2020   Procedure: ULTRASOUND GUIDANCE FOR VASCULAR ACCESS;  Surgeon: [name], MD;  Location: [facility];  Service: Vascular;  Laterality: Bilateral;   Patient Active Problem List   Diagnosis Date Noted   Type 1 dissection of thoracic aorta ([facility]) 10/26/2022   CVA (cerebral vascular accident) ([place]) 10/04/2022   Sepsis with acute renal failure without septic shock ([place]) 09/30/2022   S/P aortic dissection repair 09/18/2022   Status post surgery 09/17/2022   Aneurysm of left internal iliac artery ([place]) 05/22/2022   Colon cancer screening 05/22/2022   Essential hypertension 05/22/2022   Hemorrhoids 05/22/2022   Inguinal hernia 05/22/2022   Irritable bowel syndrome 05/22/2022   Mixed  hyperlipidemia 05/22/2022   Pure hypercholesterolemia 05/22/2022   Thrombocytopenia ([place] [facility]) 05/22/2022   Allergic rhinitis 05/22/2022   AAA (abdominal aortic aneurysm) without rupture ([place]) 05/04/2020   Trochanteric bursitis, right hip 08/20/2018   History of right hip replacement 08/20/2018   Status post total replacement of right hip 09/08/2017   Pain of right hip joint 08/07/2017   Unilateral primary osteoarthritis, right hip 08/07/2017   BPH (benign prostatic hypertrophy) with urinary obstruction 05/15/2013    ONSET DATE: 12/28/2022 (CVA on 09/17/22)  REFERRING DIAG: I63.19  (ICD-10-CM) - Cerebrovascular accident (CVA) due to embolism of other precerebral artery ([place])  THERAPY DIAG:  Unsteadiness on feet  Other lack of coordination  Other abnormalities of gait and mobility  Rationale for Evaluation and Treatment: Rehabilitation  SUBJECTIVE:                                                                                                                                                                                             SUBJECTIVE STATEMENT: Pt ambulated into clinic without using cane. Pt required max multimodal cues to find mat table on L side (pt walking straight when cued to turn L). Pt reports he is doing well, denies pain or falls. Not working on his balance exercises, mainly working out at the gym    Pt accompanied by:  Wife, James Estrada   PERTINENT HISTORY:  Pt is a 82 y/o M admitted to [facility] on 09/17/22 with type 1 aortic dissection requiring emergent repair. Head CT 11/6 acute infarcts within R MCA involvement, including R cerebellum, basal ganglia, frontal lobe. ETT 11/4-11/9. PMH AAA s/p endovascular repair (2020), HTN, HLD, GERD. Hospitalized 09/18/23 to 10/21/23.Had [place], PT, SLP.    PAIN:  Are you having pain? No  VITALS There were no vitals filed for this visit.   PRECAUTIONS: Fall  OBJECTIVE:  TODAY'S TREATMENT:    Ther Act Provided education to pt and wife regarding the 3 balance systems and importance of all three for stability. Pt verbalized not understanding why he needs to work on vestibular input despite education from therapist.    NMR In // bars for improved vestibular input, lateral weight shifting, midline orientation and postural control: On rockerboard in L/R direction, standing without UE support using mirror for visual biofeedback on body position. Noted continued pelvic tilt to L side w/R truncal lean compensation, but improved from last session.  Progressed to standing on board w/horizontal head turns, x8 each direction,  and noted increased instability when turning to L side w/delayed righting reactions, requiring min A to stabilize. Noted when pt turned head to R, he shifted weight to  L and vice versa Standing on board w/EC, 3x10s holds. Pt immediately demonstrated lateral LOB to L side w/absent righting reactions, requiring mod A from therapist to prevent fall into rail. Inquired whether pt could tell he was leaning to the L, but pt unable to fully understand question and/or answer appropriately. Progressed to providing tactile cue to L shoulder when pt leaning too far to the L and cued pt to shift R when he felt therapist's hand x5 reps. Pt w/significant difficulty shifting to R side and frequently leaned more into therapist's hand on L side.  Performed ipsilateral cone reach w/cross-body stack to work on reaching out of BOS, L inattention and weight shifting. Compared to head turns (above), when pt turned to L he shifted his weight to L side and vice versa. No instability noted throughout. Pt initially had difficulty visualizing cones on L side (attempting to grab bottom cone- ignoring superolateral quadrant).    GAIT: Gait pattern: step through pattern, Left foot flat, trendelenburg, lateral lean- Left, and wide BOS Distance walked: Various clinic distances  Assistive device utilized: None Level of assistance: SBA and CGA Comments:  Left inattention still present today as pt walked around mat on L side to side on the R side despite max verbal cues   PATIENT EDUCATION: Education details:  Importance of performing balance HEP, the 3 balance systems Person educated: Patient and Spouse Education method: Explanation, Verbal cues, and Handouts Education comprehension: verbalized understanding and needs further education  HOME EXERCISE PROGRAM: Access Code: [email or web address] URL: [email or web address] Date: 01/16/2023 Prepared by: Elease Etienne  Exercises - Standing Hamstring Curl with Resistance  - 1  x daily - 5 x weekly - 2 sets - 12 reps - Corner Balance Feet Together With Eyes Closed  - 1 x daily - 5 x weekly - 1 sets - 2 reps - 30-45 seconds hold - Standing Tandem Balance with Counter Support  - 1 x daily - 5 x weekly - 1 sets - 2 reps - 30-45 seconds hold - Heel Raises with Counter Support  - 1 x daily - 5 x weekly - 1-2 sets - 12 reps  GOALS: Goals reviewed with patient? Yes  SHORT TERM GOALS: Target date: 02/02/23  Pt will be IND with initial HEP in order to indicate improved functional mobility and dec fall risk. Baseline: Goal status: INITIAL  2.  Pt will ambulate 150' without AD mod I level in order to indicate more independent household ambulation.   Baseline:  Goal status: INITIAL  3.  Pt will improve FGA to >/=23/30 in order to indicate dec fall risk.  Baseline: 19/30 Goal status: INITIAL  4.  Pt will maintain gait speed to >/=3.0 ft/sec w/o AD while scanning environment without overt LOB in order to indicate dec fall risk.  Baseline:  Goal status: INITIAL  5.  Will assess MCTSIB and update goals to reflect progress.  Baseline: Pt scored 120/120 at baseline  Goal status: MET  6.  Pt will negotiate up/down 4 steps with single rail in alt pattern, up/down ramp and curb and ambulate x 500' outdoors over unlevel paved surfaces at S level without AD in order to indicate improved community mobility.   Baseline:  Goal status: INITIAL  LONG TERM GOALS: Target date: 03/05/23  Pt will be IND with final HEP in order to indicate improved functional mobility and dec fall risk. Baseline:  Goal status: INITIAL  2.  Pt will improve FGA to >/=26/30 in order to indicate dec  fall risk.   Baseline: 19/30 Goal status: INITIAL  3.  Will add LTG for MTCSIB once assessed.  Baseline: pt scored 120/120 at baseline Goal status: MET  4.  Pt will negotiate up/down 8 steps with single rail, up/down ramp and curb and ambulate x 1000' outdoors over unlevel paved surfaces without AD  at mod I level while scanning environment in order to indicate improved community mobility.   Baseline:  Goal status: INITIAL    ASSESSMENT:  CLINICAL IMPRESSION: Emphasis of skilled PT session on improved vestibular input, lateral weight shifting and attempting to elicit righting reactions. Pt continues to have significant L inattention, requiring max cues to ambulate to mat table on L side of gym today. Pt had significant difficulty maintaining balance w/EC and absent righting reactions to L side, requiring mod A to prevent a fall. Was unsuccessful w/use of visual and tactile cues this date to elicit righting reactions and pt demonstrated questionable ATNR w/head turns - to be further assessed. Continue POC.   OBJECTIVE IMPAIRMENTS: Abnormal gait, cardiopulmonary status limiting activity, decreased activity tolerance, decreased balance, decreased endurance, decreased mobility, decreased strength, and impaired perceived functional ability.   ACTIVITY LIMITATIONS: carrying, lifting, standing, squatting, stairs, bathing, and locomotion level  PARTICIPATION LIMITATIONS: meal prep, medication management, personal finances, driving, shopping, community activity, and yard work  PERSONAL FACTORS: Age and 3+ comorbidities: see above  are also affecting patient's functional outcome.   REHAB POTENTIAL: Good  CLINICAL DECISION MAKING: Evolving/moderate complexity  EVALUATION COMPLEXITY: Moderate  PLAN:  PT FREQUENCY: 2x/week  PT DURATION: 8 weeks  PLANNED INTERVENTIONS: Therapeutic exercises, Therapeutic activity, Neuromuscular re-education, Balance training, Gait training, Patient/Family education, Self Care, and Joint mobilization  PLAN FOR NEXT SESSION:  Add to HEP to include return to gym activities and maybe ankle strategy, gait without device, outdoor gait, dual tasking, high-level balance, left scanning w/ dynamic tasks, elliptical again-try small bursts, gait w/ dual task,  fwd/backward ambulation w/ ball toss, obstacle course, rebounder drills, blaze pods in // bars, assess ATNR  Cruzita Lederer Kendrea Cerrit[place], PT, DPT [facility] [address] [place], [place]  [address] Phone:  [phone] Fax:  [phone] 01/25/23, 2:05 PM

## 2023-01-25 NOTE — Therapy (Signed)
OUTPATIENT SPEECH LANGUAGE PATHOLOGY TREATMENT NOTE   Patient Name: James Estrada MRN: [id number] DOB:01-13-1941, 82 y.o., male Today's Date: 01/25/2023  PCP: James Carol, MD REFERRING PROVIDER: [name], MD  END OF SESSION:   End of Session - 01/25/23 1403     Visit Number 9    Number of Visits 25    SLP Start Time 1400    SLP Stop Time  [id number]    SLP Time Calculation (min) 45 min    Activity Tolerance Patient tolerated treatment well             Past Medical History:  Diagnosis Date   AAA (abdominal aortic aneurysm) ([place])    last u/s done 07/18/17    Aneurysm of left internal iliac artery ([place]) 05/22/2022   Arthritis    Bradycardia    CVA (cerebral vascular accident) ([place]) 10/04/2022   Dysrhythmia    frequent PAC, for 20 years   GERD (gastroesophageal reflux disease)    occ, OTC   Hemorrhoids    History of hiatal hernia    Hyperlipidemia    Hypertension    S/P aortic dissection repair 09/18/2022   Seasonal allergies    Type 1 dissection of thoracic aorta ([place]) 10/26/2022   Past Surgical History:  Procedure Laterality Date   ABDOMINAL AORTIC ENDOVASCULAR STENT GRAFT N/A 05/04/2020   Procedure: ABDOMINAL AORTIC ENDOVASCULAR STENT GRAFT;  Surgeon: James Posner, MD;  Location: [facility];  Service: Vascular;  Laterality: N/A;   CATARACT EXTRACTION Bilateral 2009   DIAGNOSTIC LAPAROSCOPY     laparoscopic hernia repair   EYE SURGERY Bilateral    cataract removal   HERNIA REPAIR Bilateral 1999, 2006   JOINT REPLACEMENT Right ~2018   hip replacement   pheochromocytoma  1993   PROSTATECTOMY N/A 05/15/2013   Procedure: PROSTATECTOMY RETROPUBIC; SIMPLE OPEN PROSTATECTOMY;  Surgeon: James Amass, MD;  Location: [facility];  Service: Urology;  Laterality: N/A;   REPAIR OF ACUTE ASCENDING THORACIC AORTIC DISSECTION N/A 09/17/2022   Procedure: REPAIR OF ACUTE ASCENDING THORACIC AORTIC DISSECTION USING 28 MM HEMASHIELD PLATINUM VASCULAR GRAFT;  Surgeon: James Nakayama, MD;  Location: [facility];  Service: Vascular;  Laterality: N/A;  Median sternotomy   TOTAL ELBOW REPLACEMENT Left    > 30 years ago   TOTAL HIP ARTHROPLASTY Right 09/08/2017   Procedure: RIGHT TOTAL HIP ARTHROPLASTY ANTERIOR APPROACH;  Surgeon: James Rossetti, MD;  Location: [facility];  Service: Orthopedics;  Laterality: Right;   ULTRASOUND GUIDANCE FOR VASCULAR ACCESS Bilateral 05/04/2020   Procedure: ULTRASOUND GUIDANCE FOR VASCULAR ACCESS;  Surgeon: James Posner, MD;  Location[facility];  Service: Vascular;  Laterality: Bilateral;   Patient Active Problem List   Diagnosis Date Noted   Type 1 dissection of thoracic aorta ([place]) 10/26/2022   CVA (cerebral vascular accident) ([place]) 10/04/2022   Sepsis with acute renal failure without septic shock ([place]) 09/30/2022   S/P aortic dissection repair 09/18/2022   Status post surgery 09/17/2022   Aneurysm of left internal iliac artery ([place]) 05/22/2022   Colon cancer screening 05/22/2022   Essential hypertension 05/22/2022   Hemorrhoids 05/22/2022   Inguinal hernia 05/22/2022   Irritable bowel syndrome 05/22/2022   Mixed hyperlipidemia 05/22/2022   Pure hypercholesterolemia 05/22/2022   Thrombocytopenia ([place]) 05/22/2022   Allergic rhinitis 05/22/2022   AAA (abdominal aortic aneurysm) without rupture ([place]) 05/04/2020   Trochanteric bursitis, right hip 08/20/2018   History of right hip replacement 08/20/2018   Status post total replacement  of right hip 09/08/2017   Pain of right hip joint 08/07/2017   Unilateral primary osteoarthritis, right hip 08/07/2017   BPH (benign prostatic hypertrophy) with urinary obstruction 05/15/2013    ONSET DATE: 09/17/22  REFERRING DIAG: R13.10 (ICD-10-CM) - Dysphagia, unspecified  THERAPY DIAG:  Aphasia  Rationale for Evaluation and Treatment: Rehabilitation  SUBJECTIVE:   SUBJECTIVE STATEMENT: "I went to dinner last night and everything came right out with no hesitancy"  PAIN:  Are you  having pain? No   OBJECTIVE:  Pt is a 82 y/o M admitted to [facility] on 09/17/22 with type 1 aortic dissection requiring emergent repair. Head CT 11/6 acute infarcts within R MCA involvement, including R cerebellum, basal ganglia, frontal lobe. ETT 11/4-11/9. PMH AAA s/p endovascular repair (2020), HTN, HLD, GERD.   TODAY'S TREATMENT:                                                                                                                                         DATE:   01/25/23: James Estrada reports success communicating with small group of friends at dinner out, he also communicated with waiter that his food was not right and got his meal corrected. He reports improved confidence. They are practicing reading at paragraph level at home. Today, targeted word finding in complex/abstract divergent naming tasks - James Estrada named 5-6 items in complex category with extended time usual  min semantic cues from spouse and ST, for 5 categories. Narrative about his motorcycle days with improved fluency and cohesion. He required 3 questioning cues for clarification and used compensations for aphasia 3x with usual questioning cues.   3/11/24[name] used dictation to send 5 texts to family members with success. He sent a longer more detailed text to his son re: a problem which he reported was vague and less accurate, however his son did understand the problem. Targeted word finding and written expression generating 10 items in category - James Estrada used siri to aid in spelling as needed independently. With occasional min semantic cues James Estrada names/wrote 10/10 words. Targeted reading comprehension reading 4 phrase-sentence steps in social tasks and placing them in sequential order - with extended time and rare min A, James Estrada comprehended 4/4 4-step sequences. In conversation re: his boat, James Estrada used verbal compensations for aphasia 2x as needed ("the place where everyone ties up their boat/marina, the club my father was a member -  [employer]). With written cues, James Estrada named 5 boating/nautical words.   3/6/24[name] completed HW - generating sentence with 2 words. Targeted divergent naming in moderately complex categories and written expression at the word level James Estrada generated 6 items in each category independently and 8 for each with semantic cues for the remaining 2 (goal for this was 8 - items in deli and items in bakery as he did grocery shopping before the stroke). James Estrada generated (verbally) 6 ingredients for shrimp scampi (a  dish he makes) with extended time and rare min A To target complex sentence generation, word finding, reading comprehension  Pharmacist, hospital ([facility]) was utilized. The pt generated 3 subjects and objects for 2 verbs (pull, scratch), for a total of 6 subject objects. Pt required occasional min semantic cues. Pt generated 2 complex sentences by answering "wh" questions. Pt required extended time and rare min  cues to generate complex sentences. James Estrada is to text 3 family members, using [employer] if he needs help spelling or having James Estrada check his texts an he is to correct errors on sentence generation homework   01/16/23: Pt reported that he did some of his HEP. He wrote out a list of restaurants and completed a worksheet. Pt and wife endorsed that pt's communication has overall improved. Given frequent min to mod semantic and questioning cues, pt identified 3 similarities and differences between two target items (radio/tv, coffee/coke, Engineer, structural, IT trainer, piano/organ). SLP then presented pt with a pair of words and pt constructed 2 diverse sentence for 3 targets provided usual min to mod semantic cues. In narrative discourse, pt required usual verbal cues to provide clarification.  Pt reported that reading emails "is going a little better, but not much." He stated he reads many word inaccurately and he avoids reading emails because of this. HEP: Reading, sentence  creation worksheet.   01/11/23: James Estrada reports difficulty writing and copying (homework was matching and copying sentence beginning and endings) Targeted written expression and word finding in divergent naming task - restaurants they frequent. James Estrada generated 13 restaurants with usual min to [employer]. James Estrada required consistent mod to max  A to ID and correct errors in written words with consistent mod verbal and written cues. In narrative discourse telling how he met his wife, Kymari required usual questioning cues for clarification and verbal instructions for cohesion. After initial instructions, Lonnell ID'd areas where he lacked cohesion. With usual verbal cues he used verbal compensations for word finding episodes.  01/04/23: To target sentence generation, word finding and reading comprehension  Pharmacist, hospital ([facility] was utilized. The pt generated 3 subjects and objects for 3 verbs (cast, operate, decorate), for a total of 9 subject objects. Pt required usual mod semantic and questioning cues cues. Pt generated 3 complex sentences by answering "wh" questions. Pt required occasional min semantic cues to generate complex sentences. Targeted written expression and divergent naming in personally relevant category (army) he generated 9 army terms, Id'd written errors, however required mod A to self correct. They continue to complete all homework consistently. Provided information on [employer] and [employer] news with demonstration   01/02/23: Targeted verbal compensations for aphasia, initially using Semantic Feature Analysis chart fading to structured task generating 3 salient descriptions for basic objects, progressing from photo to written word with occasional min questioning cues and semantic cues. Targeted word finding and auditory comprehension in convergent naming task. James Estrada named 10 items with rare repetition and required semantic cues and 1st letter cues for 3/10.  Targeted divergent naming in personally relevant category (stock market) - James Estrada named 15 words with extended time, usual mod semantic and questioning cues. Generated strategy to use written notes to make phone calls as needed (see patient instructions)    PATIENT EDUCATION: Education details: See patient instructions, see today's treatment, compensations for aphasia Person educated: Patient and Spouse Education method: Explanation, Verbal cues, and Handouts Education comprehension: verbal cues required and needs further education  GOALS: Goals reviewed with patient? Yes   SHORT TERM GOALS: Target date: 01/23/23   Pt will name 10 items in personally relevant categories over 3 sessions with occasional min A Baseline: Goal status: MET   2.  Pt will write 4 item grocery list with occasional min A Baseline:  Goal status: MET   3.  Pt will respond to 3 texts with occasional min A over 1 week Baseline:  Goal status: MET   4.  Pt will generate complex verbal sentences 3x using Pharmacist, hospital with rare min A over 2 sessions Baseline:  Goal status: MET   5.  Pt will comprehend 4 sentence passage/email with extended time and rare min A Baseline:  Goal status: MET   6.  Pt will employ compensations for aphasia in structured language tasks with occasional min A Baseline:  Goal status: MET   LONG TERM GOALS: Target date: 03/20/23   Pt will complete complex naming tasks with 80% accuracy and occasional min A Baseline:  Goal status: ONGOING   2.  Pt will carryover verbal compensations for aphasia with occasional min A in conversation as needed over 2 sessions Baseline:  Goal status: ONGOING   3.  Pt will carryover 3 strategies to successfully communicate in community with clerks, servers etc Baseline:  Goal status: ONGOING   4.  Pt will write 3 sentence text or email with occasional min A, external aids and occasional min A  Baseline:  Goal status:  ONGOING   5.  Pt will improve score on Communicative Participation Item Bank by 4 points Baseline: 14/30 Goal status: ONGOING       ASSESSMENT:   CLINICAL IMPRESSION: Patient is a 82 y.o. male who was seen today for aphasia s/p CVA. He is accompanied by his spouse James Estrada. Prior to CVA, James Estrada was active golfing, managing finances,  keeping up with the stock market and socializing with friends. Continued improvement in word finding, reading comprehension and written expression. Written errors persist at sentence level and he requires mod A to ID and correct written errors.l Narrative discourse requires occasional min A for cohesion and use of compensations for word finding.Ongoing training in compensations for aphasia and targeting written and verbal expression as well as reading comprehension. Short term goals met. I recommend skilled ST to maximize verbal and written communication and cognition for safety, to reduce caregiver burden and QOL.    OBJECTIVE IMPAIRMENTS: include memory and aphasia. These impairments are limiting patient from managing medications, managing appointments, managing finances, household responsibilities, ADLs/IADLs, and effectively communicating at home and in community. Factors affecting potential to achieve goals and functional outcome are ability to learn/carryover information. Patient will benefit from skilled SLP services to address above impairments and improve overall function.   REHAB POTENTIAL: Good   PLAN:   SLP FREQUENCY: 2x/week   SLP DURATION: 12 weeks   PLANNED INTERVENTIONS: Language facilitation, Environmental controls, Cueing hierachy, Cognitive reorganization, Internal/external aids, Functional tasks, and Multimodal communication approach         Randalyn Ahmed, Annye Rusk, CCC-SLP 01/25/2023, 2:52 PM

## 2023-01-26 ENCOUNTER — Ambulatory Visit (HOSPITAL_COMMUNITY)
Admission: RE | Admit: 2023-01-26 | Discharge: 2023-01-26 | Disposition: A | Discharge status: Home / Self Care | Payer: Medicare Other | Source: Ambulatory Visit | Attending: Internal Medicine | Admitting: Internal Medicine

## 2023-01-26 DIAGNOSIS — E785 Hyperlipidemia, unspecified: Secondary | ICD-10-CM | POA: Diagnosis not present

## 2023-01-26 DIAGNOSIS — I509 Heart failure, unspecified: Secondary | ICD-10-CM | POA: Diagnosis not present

## 2023-01-26 DIAGNOSIS — I110 Hypertensive heart disease with heart failure: Secondary | ICD-10-CM | POA: Diagnosis not present

## 2023-01-26 DIAGNOSIS — Z9889 Other specified postprocedural states: Secondary | ICD-10-CM | POA: Diagnosis not present

## 2023-01-26 LAB — ECHOCARDIOGRAM COMPLETE
AR max vel: 1.75 cm2
AV Area VTI: 1.77 cm2
AV Area mean vel: 1.75 cm2
AV Mean grad: 6 mmHg
AV Peak grad: 12.5 mmHg
Ao pk vel: 1.77 m/s
Area-P 1/2: 2.1 cm2
MV VTI: 2.75 cm2
P 1/2 time: 522 msec
S' Lateral: 3 cm

## 2023-01-26 NOTE — Progress Notes (Signed)
*  PRELIMINARY RESULTS* Echocardiogram 2D Echocardiogram has been performed.  James Estrada 01/26/2023, 12:12 PM

## 2023-01-30 ENCOUNTER — Ambulatory Visit: Payer: Medicare Other | Admitting: Occupational Therapy

## 2023-01-30 ENCOUNTER — Ambulatory Visit: Payer: Medicare Other | Admitting: Physical Therapy

## 2023-01-30 ENCOUNTER — Ambulatory Visit: Payer: Medicare Other | Admitting: Speech Pathology

## 2023-01-30 ENCOUNTER — Encounter: Payer: Self-pay | Admitting: Physical Therapy

## 2023-01-30 DIAGNOSIS — R29818 Other symptoms and signs involving the nervous system: Secondary | ICD-10-CM | POA: Diagnosis not present

## 2023-01-30 DIAGNOSIS — R41842 Visuospatial deficit: Secondary | ICD-10-CM | POA: Diagnosis not present

## 2023-01-30 DIAGNOSIS — R278 Other lack of coordination: Secondary | ICD-10-CM | POA: Diagnosis not present

## 2023-01-30 DIAGNOSIS — M6281 Muscle weakness (generalized): Secondary | ICD-10-CM | POA: Diagnosis not present

## 2023-01-30 DIAGNOSIS — R2681 Unsteadiness on feet: Secondary | ICD-10-CM | POA: Diagnosis not present

## 2023-01-30 DIAGNOSIS — R4701 Aphasia: Secondary | ICD-10-CM

## 2023-01-30 DIAGNOSIS — I69352 Hemiplegia and hemiparesis following cerebral infarction affecting left dominant side: Secondary | ICD-10-CM | POA: Diagnosis not present

## 2023-01-30 DIAGNOSIS — R2689 Other abnormalities of gait and mobility: Secondary | ICD-10-CM | POA: Diagnosis not present

## 2023-01-30 DIAGNOSIS — R41841 Cognitive communication deficit: Secondary | ICD-10-CM | POA: Diagnosis not present

## 2023-01-30 NOTE — Therapy (Signed)
OUTPATIENT PHYSICAL THERAPY NEURO TREATMENT   Patient Name: James Estrada MRN: [id number] DOB:1941-01-10, 82 y.o., male Today's Date: 01/30/2023   PCP: [name], MD REFERRING PROVIDER: Jolaine Artist, MD  END OF SESSION:  PT End of Session - 01/30/23 1318     Visit Number 7    Number of Visits 17    Date for PT Re-Evaluation 03/05/23    Authorization Type BCBS Medicare (needs 10th visit PN)    Progress Note Due on Visit 10    PT Start Time 1315    PT Stop Time 1400    PT Time Calculation (min) 45 min    Equipment Utilized During Treatment Gait belt    Activity Tolerance Patient tolerated treatment well    Behavior During Therapy WFL for tasks assessed/performed              Past Medical History:  Diagnosis Date   AAA (abdominal aortic aneurysm) ([place])    last u/s done 07/18/17    Aneurysm of left internal iliac artery ([place]) 05/22/2022   Arthritis    Bradycardia    CVA (cerebral vascular accident) ([place]) 10/04/2022   Dysrhythmia    frequent PAC, for 20 years   GERD (gastroesophageal reflux disease)    occ, OTC   Hemorrhoids    History of hiatal hernia    Hyperlipidemia    Hypertension    S/P aortic dissection repair 09/18/2022   Seasonal allergies    Type 1 dissection of thoracic aorta ([place]) 10/26/2022   Past Surgical History:  Procedure Laterality Date   ABDOMINAL AORTIC ENDOVASCULAR STENT GRAFT N/A 05/04/2020   Procedure: ABDOMINAL AORTIC ENDOVASCULAR STENT GRAFT;  Surgeon: [name], MD;  Location: [place];  Service: Vascular;  Laterality: N/A;   CATARACT EXTRACTION Bilateral 2009   DIAGNOSTIC LAPAROSCOPY     laparoscopic hernia repair   EYE SURGERY Bilateral    cataract removal   HERNIA REPAIR Bilateral 1999, 2006   JOINT REPLACEMENT Right ~2018   hip replacement   pheochromocytoma  1993   PROSTATECTOMY N/A 05/15/2013   Procedure: PROSTATECTOMY RETROPUBIC; SIMPLE OPEN PROSTATECTOMY;  Surgeon: [name], MD;  [facility];   Service: Urology;  Laterality: N/A;   REPAIR OF ACUTE ASCENDING THORACIC AORTIC DISSECTION N/A 09/17/2022   Procedure: REPAIR OF ACUTE ASCENDING THORACIC AORTIC DISSECTION USING 28 MM HEMASHIELD PLATINUM VASCULAR GRAFT;  Surgeon: [name], MD;  Location: [facility];  Service: Vascular;  Laterality: N/A;  Median sternotomy   TOTAL ELBOW REPLACEMENT Left    > 30 years ago   TOTAL HIP ARTHROPLASTY Right 09/08/2017   Procedure: RIGHT TOTAL HIP ARTHROPLASTY ANTERIOR APPROACH;  Surgeon: [name], MD;  Location[facility];  Service: Orthopedics;  Laterality: Right;   ULTRASOUND GUIDANCE FOR VASCULAR ACCESS Bilateral 05/04/2020   Procedure: ULTRASOUND GUIDANCE FOR VASCULAR ACCESS;  Surgeon: [name], MD;  Location: [facility];  Service: Vascular;  Laterality: Bilateral;   Patient Active Problem List   Diagnosis Date Noted   Type 1 dissection of thoracic aorta ([place]) 10/26/2022   CVA (cerebral vascular accident) ([place]) 10/04/2022   Sepsis with acute renal failure without septic shock ([place]) 09/30/2022   S/P aortic dissection repair 09/18/2022   Status post surgery 09/17/2022   Aneurysm of left internal iliac artery ([place]) 05/22/2022   Colon cancer screening 05/22/2022   Essential hypertension 05/22/2022   Hemorrhoids 05/22/2022   Inguinal hernia 05/22/2022   Irritable bowel syndrome 05/22/2022   Mixed  hyperlipidemia 05/22/2022   Pure hypercholesterolemia 05/22/2022   Thrombocytopenia ([place]) 05/22/2022   Allergic rhinitis 05/22/2022   AAA (abdominal aortic aneurysm) without rupture ([place]) 05/04/2020   Trochanteric bursitis, right hip 08/20/2018   History of right hip replacement 08/20/2018   Status post total replacement of right hip 09/08/2017   Pain of right hip joint 08/07/2017   Unilateral primary osteoarthritis, right hip 08/07/2017   BPH (benign prostatic hypertrophy) with urinary obstruction 05/15/2013    ONSET DATE: 12/28/2022 (CVA on 09/17/22)  REFERRING DIAG: I63.19  (ICD-10-CM) - Cerebrovascular accident (CVA) due to embolism of other precerebral artery ([place])  THERAPY DIAG:  Other lack of coordination  Other symptoms and signs involving the nervous system  Muscle weakness (generalized)  Rationale for Evaluation and Treatment: Rehabilitation  SUBJECTIVE:                                                                                                                                                                                             SUBJECTIVE STATEMENT: Pt ambulated into clinic without using cane. Pt denies falls/near falls.  He states he has been back on the elliptical at home for 15 minutes on level 2.0.  Other than this he has been using light free weights for his arm strength.   Pt accompanied by:  Wife, James Estrada   PERTINENT HISTORY:  Pt is a 82 y/o M admitted to [name] on 09/17/22 with type 1 aortic dissection requiring emergent repair. Head CT 11/6 acute infarcts within R MCA involvement, including R cerebellum, basal ganglia, frontal lobe. ETT 11/4-11/9. PMH AAA s/p endovascular repair (2020), HTN, HLD, GERD. Hospitalized 09/18/23 to 10/21/23.Had [place], PT, SLP.    PAIN:  Are you having pain? No  VITALS There were no vitals filed for this visit.   PRECAUTIONS: Fall  OBJECTIVE:  TODAY'S TREATMENT:    Ther Act Assessed ATNR in standing and quadruped w/ pt performing test x3 in each position; mild elbow bend in quadruped on singular rep of head turn to right, but this is likely due to pt preference to weight shift to the right due to left weakness; explained assessment, rationale for assessment from session with different therapist prior, and findings from today to patient and wife.  No positive test.  NMR Birddogs UE only x5 each side > LE only x5 each side > full birddog x4 each side w/ difficulty coordinating RUE w/ LLE Airex standing w/ multi-directional perturbations in normal BOS x2 minutes 200' resisted walking > 300'  multi-directional perturbations w/ pt using lateral and cross stepping for recovery Forward and backwards ambulation w/ ball toss 2x30' > added  cognitive dual task of naming food on forward 30' and [place] on backwards portion w/ several instances of stopped gait and significantly slowing of ambulation Blaze pods 3x1 minute rounds w/ 30 second rest using 2 distracting colors for lateral stepping toe taps, no inaccurate taps; 15 > 17 > 19 taps; pt needing cues to approximate pod prior to attempting tap to allow improved SLS challenge w/o need for UE support  PATIENT EDUCATION: Education details:  Continue HEP.  Discussed findings of assessment of ATNR w/o positive test today. Person educated: Patient and Spouse Education method: Explanation, Verbal cues, and Handouts Education comprehension: verbalized understanding and needs further education  HOME EXERCISE PROGRAM: Access Code: [email or web address] URL: [email or web address] Date: 01/16/2023 Prepared by: Elease Etienne  Exercises - Standing Hamstring Curl with Resistance  - 1 x daily - 5 x weekly - 2 sets - 12 reps - Corner Balance Feet Together With Eyes Closed  - 1 x daily - 5 x weekly - 1 sets - 2 reps - 30-45 seconds hold - Standing Tandem Balance with Counter Support  - 1 x daily - 5 x weekly - 1 sets - 2 reps - 30-45 seconds hold - Heel Raises with Counter Support  - 1 x daily - 5 x weekly - 1-2 sets - 12 reps  GOALS: Goals reviewed with patient? Yes  SHORT TERM GOALS: Target date: 02/02/23  Pt will be IND with initial HEP in order to indicate improved functional mobility and dec fall risk. Baseline: Goal status: INITIAL  2.  Pt will ambulate 150' without AD mod I level in order to indicate more independent household ambulation.   Baseline:  Goal status: INITIAL  3.  Pt will improve FGA to >/=23/30 in order to indicate dec fall risk.  Baseline: 19/30 Goal status: INITIAL  4.  Pt will maintain gait speed to  >/=3.0 ft/sec w/o AD while scanning environment without overt LOB in order to indicate dec fall risk.  Baseline:  Goal status: INITIAL  5.  Will assess MCTSIB and update goals to reflect progress.  Baseline: Pt scored 120/120 at baseline  Goal status: MET  6.  Pt will negotiate up/down 4 steps with single rail in alt pattern, up/down ramp and curb and ambulate x 500' outdoors over unlevel paved surfaces at S level without AD in order to indicate improved community mobility.   Baseline:  Goal status: INITIAL  LONG TERM GOALS: Target date: 03/05/23  Pt will be IND with final HEP in order to indicate improved functional mobility and dec fall risk. Baseline:  Goal status: INITIAL  2.  Pt will improve FGA to >/=26/30 in order to indicate dec fall risk.   Baseline: 19/30 Goal status: INITIAL  3.  Will add LTG for MTCSIB once assessed.  Baseline: pt scored 120/120 at baseline Goal status: MET  4.  Pt will negotiate up/down 8 steps with single rail, up/down ramp and curb and ambulate x 1000' outdoors over unlevel paved surfaces without AD at mod I level while scanning environment in order to indicate improved community mobility.   Baseline:  Goal status: INITIAL    ASSESSMENT:  CLINICAL IMPRESSION: Assessed ATNR this session in 2 variant positions without patient exhibiting elicit reflex today.  Possible that tendency to flex left UE w/ right head rotation noted could be related to post-CVA muscle tightness or weakness of the affected side as noted this session.  He demonstrated improved left scanning this session and tolerated all high level balance  challenges well.  He remains highly challenged by cognitive dual tasks often unable to continue activity while processing.  Will continue to address this deficit with others outlined in ongoing skilled PT POC.  OBJECTIVE IMPAIRMENTS: Abnormal gait, cardiopulmonary status limiting activity, decreased activity tolerance, decreased balance,  decreased endurance, decreased mobility, decreased strength, and impaired perceived functional ability.   ACTIVITY LIMITATIONS: carrying, lifting, standing, squatting, stairs, bathing, and locomotion level  PARTICIPATION LIMITATIONS: meal prep, medication management, personal finances, driving, shopping, community activity, and yard work  PERSONAL FACTORS: Age and 3+ comorbidities: see above  are also affecting patient's functional outcome.   REHAB POTENTIAL: Good  CLINICAL DECISION MAKING: Evolving/moderate complexity  EVALUATION COMPLEXITY: Moderate  PLAN:  PT FREQUENCY: 2x/week  PT DURATION: 8 weeks  PLANNED INTERVENTIONS: Therapeutic exercises, Therapeutic activity, Neuromuscular re-education, Balance training, Gait training, Patient/Family education, Self Care, and Joint mobilization  PLAN FOR NEXT SESSION:  ASSESS STG!  Add to HEP prn, outdoor gait-dual task, obstacle course, rebounder drills, blaze pods in // bars-add obstacle course and UE/LE distinction tasks w/ distracting colors, tilt board ball toss w/ cognitive dual task  Elease Etienne, PT, [facility] [address] [place], [place]  [address] Phone:  [phone] Fax:  [phone] 01/30/23, 2:00 PM

## 2023-01-30 NOTE — Therapy (Signed)
OUTPATIENT SPEECH LANGUAGE PATHOLOGY TREATMENT NOTE   Patient Name: James Estrada MRN: [id number] DOB:06/20/1941, 82 y.o., male Today's Date: 01/30/2023  PCP: James Carol, MD REFERRING PROVIDER: [name], MD  END OF SESSION:   End of Session - 01/30/23 1404     Visit Number 10    Number of Visits 25    Date for SLP Re-Evaluation 03/20/23    SLP Start Time 56    SLP Stop Time  [id number]    SLP Time Calculation (min) 45 min    Activity Tolerance Patient tolerated treatment well             Past Medical History:  Diagnosis Date   AAA (abdominal aortic aneurysm) ([place])    last u/s done 07/18/17    Aneurysm of left internal iliac artery ([place]) 05/22/2022   Arthritis    Bradycardia    CVA (cerebral vascular accident) ([place]) 10/04/2022   Dysrhythmia    frequent PAC, for 20 years   GERD (gastroesophageal reflux disease)    occ, OTC   Hemorrhoids    History of hiatal hernia    Hyperlipidemia    Hypertension    S/P aortic dissection repair 09/18/2022   Seasonal allergies    Type 1 dissection of thoracic aorta ([place]) 10/26/2022   Past Surgical History:  Procedure Laterality Date   ABDOMINAL AORTIC ENDOVASCULAR STENT GRAFT N/A 05/04/2020   Procedure: ABDOMINAL AORTIC ENDOVASCULAR STENT GRAFT;  Surgeon: James Posner, MD;  Location: [facility];  Service: Vascular;  Laterality: N/A;   CATARACT EXTRACTION Bilateral 2009   DIAGNOSTIC LAPAROSCOPY     laparoscopic hernia repair   EYE SURGERY Bilateral    cataract removal   HERNIA REPAIR Bilateral 1999, 2006   JOINT REPLACEMENT Right ~2018   hip replacement   pheochromocytoma  1993   PROSTATECTOMY N/A 05/15/2013   Procedure: PROSTATECTOMY RETROPUBIC; SIMPLE OPEN PROSTATECTOMY;  Surgeon: James Amass, MD;  [facility];  Service: Urology;  Laterality: N/A;   REPAIR OF ACUTE ASCENDING THORACIC AORTIC DISSECTION N/A 09/17/2022   Procedure: REPAIR OF ACUTE ASCENDING THORACIC AORTIC DISSECTION USING 28 MM HEMASHIELD PLATINUM  VASCULAR GRAFT;  Surgeon: James Nakayama, MD;  Location: [place] [facility];  Service: Vascular;  Laterality: N/A;  Median sternotomy   TOTAL ELBOW REPLACEMENT Left    > 30 years ago   TOTAL HIP ARTHROPLASTY Right 09/08/2017   Procedure: RIGHT TOTAL HIP ARTHROPLASTY ANTERIOR APPROACH;  Surgeon: James Rossetti, MD;  [facility];  Service: Orthopedics;  Laterality: Right;   ULTRASOUND GUIDANCE FOR VASCULAR ACCESS Bilateral 05/04/2020   Procedure: ULTRASOUND GUIDANCE FOR VASCULAR ACCESS;  Surgeon: James Posner, MD;  Location: [facility];  Service: Vascular;  Laterality: Bilateral;   Patient Active Problem List   Diagnosis Date Noted   Type 1 dissection of thoracic aorta ([place]) 10/26/2022   CVA (cerebral vascular accident) ([place]) 10/04/2022   Sepsis with acute renal failure without septic shock ([place]) 09/30/2022   S/P aortic dissection repair 09/18/2022   Status post surgery 09/17/2022   Aneurysm of left internal iliac artery ([place]) 05/22/2022   Colon cancer screening 05/22/2022   Essential hypertension 05/22/2022   Hemorrhoids 05/22/2022   Inguinal hernia 05/22/2022   Irritable bowel syndrome 05/22/2022   Mixed hyperlipidemia 05/22/2022   Pure hypercholesterolemia 05/22/2022   Thrombocytopenia ([place]) 05/22/2022   Allergic rhinitis 05/22/2022   AAA (abdominal aortic aneurysm) without rupture ([place]) 05/04/2020   Trochanteric bursitis, right hip 08/20/2018   History of right hip  replacement 08/20/2018   Status post total replacement of right hip 09/08/2017   Pain of right hip joint 08/07/2017   Unilateral primary osteoarthritis, right hip 08/07/2017   BPH (benign prostatic hypertrophy) with urinary obstruction 05/15/2013    ONSET DATE: 09/17/22  REFERRING DIAG: R13.10 (ICD-10-CM) - Dysphagia, unspecified  THERAPY DIAG:  Aphasia  Rationale for Evaluation and Treatment: Rehabilitation  SUBJECTIVE:   SUBJECTIVE STATEMENT: "I'm good"   PAIN:  Are you having pain? No   OBJECTIVE:   Pt is a 82 y/o M admitted to [facility] on 09/17/22 with type 1 aortic dissection requiring emergent repair. Head CT 11/6 acute infarcts within R MCA involvement, including R cerebellum, basal ganglia, frontal lobe. ETT 11/4-11/9. PMH AAA s/p endovascular repair (2020), HTN, HLD, GERD.   TODAY'S TREATMENT:                                                                                                                                         DATE:   01/30/23: Pt reported that he continues to experience moments of communicative difficulty in groups. Pt has been successful in summarizing brief reading passages, requested strategies on how to challenge himself. SLP discussed reading longer passages on more complex topics and then summarizing the material to a family member.  Targeted anomia, complex sentence generation utilizing VNeST (target verb: pour, monitor, defend, climb) to generate 3 subjects and objects for each verb for a total of 12. Given rare min A, pt generated three subject-object pairs. Pt then answered when, where, why questions for each verb given rare min-A to generate 4 complex sentences.   01/25/23: James Estrada reports success communicating with small group of friends at dinner out, he also communicated with waiter that his food was not right and got his meal corrected. He reports improved confidence. They are practicing reading at paragraph level at home. Today, targeted word finding in complex/abstract divergent naming tasks - James Estrada named 5-6 items in complex category with extended time usual  min semantic cues from spouse and James Estrada, for 5 categories. Narrative about his motorcycle days with improved fluency and cohesion. He required 3 questioning cues for clarification and used compensations for aphasia 3x with usual questioning cues.   3/11/24[name] used dictation to send 5 texts to family members with success. He sent a longer more detailed text to his son re: a problem which he reported was vague  and less accurate, however his son did understand the problem. Targeted word finding and written expression generating 10 items in category - Demetrik used siri to aid in spelling as needed independently. With occasional min semantic cues Irbin names/wrote 10/10 words. Targeted reading comprehension reading 4 phrase-sentence steps in social tasks and placing them in sequential order - with extended time and rare min A, Xandar comprehended 4/4 4-step sequences. In conversation re: his boat, Ashar used verbal compensations for aphasia 2x as needed ("the  place where everyone ties up their boat/marina, the club my father was a member - [employer]). With written cues, Jassen named 5 boating/nautical words.   3/6/24[name] completed HW - generating sentence with 2 words. Targeted divergent naming in moderately complex categories and written expression at the word level James Estrada generated 6 items in each category independently and 8 for each with semantic cues for the remaining 2 (goal for this was 8 - items in deli and items in bakery as he did grocery shopping before the stroke). Rion generated (verbally) 6 ingredients for shrimp scampi (a dish he makes) with extended time and rare min A To target complex sentence generation, word finding, reading comprehension  Pharmacist, hospital (VNeST) was utilized. The pt generated 3 subjects and objects for 2 verbs (pull, scratch), for a total of 6 subject objects. Pt required occasional min semantic cues. Pt generated 2 complex sentences by answering "wh" questions. Pt required extended time and rare min  cues to generate complex sentences. Aydyn is to text 3 family members, using [employer] Hatchet if he needs help spelling or having [name] check his texts an he is to correct errors on sentence generation homework.    PATIENT EDUCATION: Education details: See patient instructions, see today's treatment, compensations for aphasia Person educated: Patient  and Spouse Education method: Explanation, Verbal cues, and Handouts Education comprehension: verbal cues required and needs further education    Speech Therapy Progress Note  Dates of Reporting Period: 12/26/22 to 01/30/23  Objective Reports of Subjective Statement: Spouse and patient report improved confidence and participation in group conversations  Objective Measurements: See goals  Goal Update: continue goal  Plan: continue POC  Reason Skilled Services are Required: Improving aphasia, improving sentence structure. Saviyon continues to avoid phone calls, has difficulty communicating in groups and in fast moving conversations  GOALS: Goals reviewed with patient? Yes   SHORT TERM GOALS: Target date: 01/23/23   Pt will name 10 items in personally relevant categories over 3 sessions with occasional min A Baseline: Goal status: MET   2.  Pt will write 4 item grocery list with occasional min A Baseline:  Goal status: MET   3.  Pt will respond to 3 texts with occasional min A over 1 week Baseline:  Goal status: MET   4.  Pt will generate complex verbal sentences 3x using Pharmacist, hospital with rare min A over 2 sessions Baseline:  Goal status: MET   5.  Pt will comprehend 4 sentence passage/email with extended time and rare min A Baseline:  Goal status: MET   6.  Pt will employ compensations for aphasia in structured language tasks with occasional min A Baseline:  Goal status: MET   LONG TERM GOALS: Target date: 03/20/23   Pt will complete complex naming tasks with 80% accuracy and occasional min A Baseline:  Goal status: ONGOING   2.  Pt will carryover verbal compensations for aphasia with occasional min A in conversation as needed over 2 sessions Baseline:  Goal status: ONGOING   3.  Pt will carryover 3 strategies to successfully communicate in community with clerks, servers etc Baseline:  Goal status: ONGOING   4.  Pt will write 3 sentence text  or email with occasional min A, external aids and occasional min A  Baseline:  Goal status: ONGOING   5.  Pt will improve score on Communicative Participation Item Bank by 4 points Baseline: 14/30 Goal status: ONGOING  ASSESSMENT:   CLINICAL IMPRESSION: Patient is a 82 y.o. male who was seen today for aphasia s/p CVA. He is accompanied by his spouse [name]. Prior to CVA, Trayten was active golfing, managing finances,  keeping up with the stock market and socializing with friends. Aphasia improved to mild aphasia. Continued improvement in word finding, reading comprehension and written expression. He is now texitng family regularly with success. Ongoing training in compensations for aphasia and targeting written and verbal expression as well as reading comprehension. Short term goals met. I recommend skilled James Estrada to maximize verbal and written communication and cognition for safety, to reduce caregiver burden and QOL.    OBJECTIVE IMPAIRMENTS: include memory and aphasia. These impairments are limiting patient from managing medications, managing appointments, managing finances, household responsibilities, ADLs/IADLs, and effectively communicating at home and in community. Factors affecting potential to achieve goals and functional outcome are ability to learn/carryover information. Patient will benefit from skilled SLP services to address above impairments and improve overall function.   REHAB POTENTIAL: Good   PLAN:   SLP FREQUENCY: 2x/week   SLP DURATION: 12 weeks   PLANNED INTERVENTIONS: Language facilitation, Environmental controls, Cueing hierachy, Cognitive reorganization, Internal/external aids, Functional tasks, and Multimodal communication approach         Leroy Libman, Student-SLP 01/30/2023, 2:42 PM

## 2023-01-31 DIAGNOSIS — I69354 Hemiplegia and hemiparesis following cerebral infarction affecting left non-dominant side: Secondary | ICD-10-CM | POA: Diagnosis not present

## 2023-01-31 DIAGNOSIS — I480 Paroxysmal atrial fibrillation: Secondary | ICD-10-CM | POA: Diagnosis not present

## 2023-01-31 DIAGNOSIS — E7800 Pure hypercholesterolemia, unspecified: Secondary | ICD-10-CM | POA: Diagnosis not present

## 2023-01-31 DIAGNOSIS — D696 Thrombocytopenia, unspecified: Secondary | ICD-10-CM | POA: Diagnosis not present

## 2023-01-31 NOTE — Therapy (Signed)
OUTPATIENT OCCUPATIONAL THERAPY NEURO TREATMENT NOTE   Patient Name: James Estrada MRN: [id number] DOB:May 13, 1941, 82 y.o., male Today's Date: 02/01/2023  PCP: [name], MD  REFERRING PROVIDER: Jolaine Artist, MD  END OF SESSION:  OT End of Session - 02/01/23 1456     Visit Number 3    Number of Visits 13    Date for OT Re-Evaluation 04/14/23    Authorization Type BC[employer] - no auth required    Progress Note Due on Visit 10    OT Start Time [id number]    OT Stop Time 1526    OT Time Calculation (min) 30 min    Equipment Utilized During Treatment t-putty    Activity Tolerance Patient tolerated treatment well;No increased pain    Behavior During Therapy [facility] for tasks assessed/performed               Past Medical History:  Diagnosis Date   AAA (abdominal aortic aneurysm) ([place])    last u/s done 07/18/17    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) 05/22/2022   Arthritis    Bradycardia    CVA (cerebral vascular accident) ([place]) 10/04/2022   Dysrhythmia    frequent PAC, for 20 years   GERD (gastroesophageal reflux disease)    occ, OTC   Hemorrhoids    History of hiatal hernia    Hyperlipidemia    Hypertension    S/P aortic dissection repair 09/18/2022   Seasonal allergies    Type 1 dissection of thoracic aorta ([place]) 10/26/2022   Past Surgical History:  Procedure Laterality Date   ABDOMINAL AORTIC ENDOVASCULAR STENT GRAFT N/A 05/04/2020   Procedure: ABDOMINAL AORTIC ENDOVASCULAR STENT GRAFT;  Surgeon: [name], MD;  Location: [place];  Service: Vascular;  Laterality: N/A;   CATARACT EXTRACTION Bilateral 2009   DIAGNOSTIC LAPAROSCOPY     laparoscopic hernia repair   EYE SURGERY Bilateral    cataract removal   HERNIA REPAIR Bilateral 1999, 2006   JOINT REPLACEMENT Right ~2018   hip replacement   pheochromocytoma  1993   PROSTATECTOMY N/A 05/15/2013   Procedure: PROSTATECTOMY RETROPUBIC; SIMPLE OPEN PROSTATECTOMY;  Surgeon: [name], MD;  Location: [facility];  Service: Urology;  Laterality: N/A;   REPAIR OF ACUTE ASCENDING THORACIC AORTIC DISSECTION N/A 09/17/2022   Procedure: REPAIR OF ACUTE ASCENDING THORACIC AORTIC DISSECTION USING 28 MM HEMASHIELD PLATINUM VASCULAR GRAFT;  Surgeon: [name], MD;  Location: [place];  Service: Vascular;  Laterality: N/A;  Median sternotomy   TOTAL ELBOW REPLACEMENT Left    > 30 years ago   TOTAL HIP ARTHROPLASTY Right 09/08/2017   Procedure: RIGHT TOTAL HIP ARTHROPLASTY ANTERIOR APPROACH;  Surgeon: [name], MD;  Location: [facility];  Service: Orthopedics;  Laterality: Right;   ULTRASOUND GUIDANCE FOR VASCULAR ACCESS Bilateral 05/04/2020   Procedure: ULTRASOUND GUIDANCE FOR VASCULAR ACCESS;  Surgeon: [name], MD;  Location: [facility] OR;  Service: Vascular;  Laterality: Bilateral;   Patient Active Problem List   Diagnosis Date Noted   Type 1 dissection of thoracic aorta ([place]) 10/26/2022   CVA (cerebral vascular accident) ([place]) 10/04/2022   Sepsis with acute renal failure without septic shock ([place]) 09/30/2022   S/P aortic dissection repair 09/18/2022   Status post surgery 09/17/2022   Aneurysm of left internal iliac artery ([place]) 05/22/2022   Colon cancer screening 05/22/2022   Essential hypertension 05/22/2022   Hemorrhoids 05/22/2022   Inguinal hernia 05/22/2022   Irritable bowel syndrome 05/22/2022  Mixed hyperlipidemia 05/22/2022   Pure hypercholesterolemia 05/22/2022   Thrombocytopenia ([place]) 05/22/2022   Allergic rhinitis 05/22/2022   AAA (abdominal aortic aneurysm) without rupture ([place]) 05/04/2020   Trochanteric bursitis, right hip 08/20/2018   History of right hip replacement 08/20/2018   Status post total replacement of right hip 09/08/2017   Pain of right hip joint 08/07/2017   Unilateral primary osteoarthritis, right hip 08/07/2017   BPH (benign prostatic hypertrophy) with urinary obstruction 05/15/2013    ONSET DATE: 12/28/2022 (CVA on 09/17/22)   REFERRING DIAG:  I63.19 (ICD-10-CM) - Cerebrovascular accident (CVA) due to embolism of other precerebral artery  THERAPY DIAG:  Other lack of coordination  Muscle weakness (generalized)  Rationale for Evaluation and Treatment: Rehabilitation  PERTINENT HISTORY:  "Pt is a 82 y/o M admitted to [facility] on 09/17/22 with type 1 aortic dissection requiring emergent repair. Head CT 11/6 acute infarcts within R MCA involvement, including R cerebellum, basal ganglia, frontal lobe. ETT 11/4-11/9. PMH AAA s/p endovascular repair (2020), HTN, HLD, GERD. Hosoitalized 09/18/23 to 10/21/23.Had [place], PT, SLP."   Has had limited L supination since elbow fracture prior to CVA. He has been using block letters for writing but is highly motivated to work on his handwriting in therapy.    PRECAUTIONS: Fall;  WEIGHT BEARING RESTRICTIONS: No   SUBJECTIVE:   SUBJECTIVE STATEMENT: He states working on fine motor skills manipulations but having difficulty.  His wife states that his sequencing and IADLs in the kitchen may need help from therapy as well.   Pt accompanied by: significant other- Betty   PAIN:  Are you having pain? No   FALLS: Has patient fallen in last 6 months? Yes. Number of falls 1  LIVING ENVIRONMENT: Lives with: lives with their family Lives in: House/apartment (lives at daughters house-will be moving to [place] in April/May to single home).  Stairs: Yes: Internal: flight but doesn't use steps; on left going up and External: 2 steps; none Has following equipment at home: Single point cane, shower chair, Grab bars, and tub/shower    PLOF: Independent, retired Designer, fashion/clothing (lots of computer use), golf, went to the gym on a daily basis, was an avid gardener; driving   PATIENT GOALS: return to PLOF    OBJECTIVE:  (All objective assessments below are from initial evaluation on: 01/16/23 unless otherwise specified.)   HAND DOMINANCE: Left  ADLs: Overall ADLs: mostly mod I with assistance in cutting  up meat  IADLs: Shopping: max A Meal Prep: liked to cook and bake - has been able to make some meals with min A Community mobility: dependent Medication management: dependent Financial management: dependent Handwriting: 25% legible  MOBILITY STATUS: Needs Assist: SPC for community mobility  ACTIVITY TOLERANCE: Activity tolerance: Fair  FUNCTIONAL OUTCOME MEASURES: FOTO: to be completed at a later date  UPPER EXTREMITY ROM:     AROM Right (eval) Left (eval)  Shoulder flexion WNL WNL  Shoulder abduction WNL WNL  Elbow flexion WNL WNL  Elbow extension WNL WNL  Wrist flexion WNL WNL  Wrist extension WNL WNL  Wrist pronation WNL WNL  Wrist supination WNL Impaired (chronic)   Digit Composite Flexion WNL WNL  Digit Composite Extension WNL WNL  Digit Opposition WNL WNL  (Blank rows = not tested)  UPPER EXTREMITY MMT:     MMT Right (eval) Left (eval)  Shoulder flexion WNL WFL  Shoulder abduction WNL WFL  Elbow flexion WNL WFL  Elbow extension WNL WFL  (Blank rows = not tested)  HAND FUNCTION: Grip strength: Right: 72.9 lbs; Left: 41 lbs  COORDINATION: Finger Nose Finger test: impaired on L 9 Hole Peg test: Right: 33.5 sec; Left: 44.6 sec  MUSCLE TONE: LUE: Within functional limits  COGNITION: Overall cognitive status: Impaired and See ST note for further details  VISION: Subjective report: some boughts of double vision Baseline vision: Wears glasses for reading only and OTC readers Visual history: cataracts  VISION ASSESSMENT: Tracking/Visual pursuits: Decreased smoothness with horizontal tracking Visual Fields: Left visual field deficits  PERCEPTION: Impaired: Inattention/neglect: difficulty discriminating R vs L  OBSERVATIONS:  02/01/23: no cane use now, still disuses Lt dominant side at times, favoring Rt hand when learning exercises. Some apparent confusion with directions at times and demonstrating back exercises that were just taught.    TODAY'S  TREATMENT:                                                                                                                               02/01/23: OT starts with review of in hand manipulation skills which she demonstrates back initially with his right hand, his wife having to remind him he is working on his left hand.  He has some difficulty and stumbles with his left hand at times and is encouraged to keep working on it.  Next to further advance fine motor skills and left hand use, OT provides the following home exercise program for putty therapy activities to increase the strength and dexterity of his left hand.  He needs repeat instructions at times and has difficulty performing the claw fist.  OT also makes functional suggestions for the wife to have him read through the directions of his home exercises, perform exercises in order for sequencing skills.  They are also advised to discuss details of TV shows together and try to recall activities done throughout the day.  She was recommended to give him simpler tasks for the kitchen and break up tasks into smaller steps.  She states understanding but this was barely addressed today and will likely need more in upcoming sessions.  Exercises - Full Fist  - 2-3 x daily - 5 reps - Seated Claw Fist with Putty  - 2-3 x daily - 5 reps - Finger Extension "Pizza!"   - 2-3 x daily - 5 reps - Thumb Press  - 2-3 x daily - 5 reps - Thumb Opposition with Putty  - 2-3 x daily - 5 reps    01/25/23: OT gives education today to use left dominant hand for all functional activities as possible at home that are safe.  Additionally he is educated on fine motor skills and hand manipulation activities as below to increase handwriting skills.  He demonstrates back with some difficulty but increasing performance throughout.  He states enjoying these.  Next he is given education on convergence and divergence activity (moving finger toward and away from nose while keeping focus),  indicates that he should still have  some visual deficits that he wants to work on.  This may also help vision and inattention issues.  Lastly they were educated to position televisions, radio's, and people to his left side to increase input and awareness of the left side whenever possible.  They state understanding this and think it is a good idea.  No questions or concerns at end of session.  In-Hand Manipulation Skills Rotation:  Hold pen, try to "twirl" like a baton, keeping parallel (or flat) with surface of table. Try going BOTH directions 10x  Flip:  Hold pen in writing position,  flip in an arch to "erase" position, then back to "write" position. Do not lift hand off table.  10x  Translation:  Open hand palm up,  put an object (bigger is easier (fat marker), smaller is harder (penny)) in your palm and then use your fingers and thumb to move it to the tips of your fingers, pinched against your thumb.  10x  Shift:  Hold pen like a dart, start "shifting" it forward (like putting a key in a key hole) until you are holding it at the base, then shift it backwards until you are holding it at the tip again. 10x    PATIENT EDUCATION: Education details: OT role and POC Person educated: Patient and Spouse Education method: Explanation Education comprehension: verbalized understanding & returns [place]: 01/25/23: In-hand manipulations as listed above  02/01/23: Access Code: [email or web address] URL: [email or web address]   GOALS:  SHORT TERM GOALS: Target date: 02/13/2023    Patient will demonstrate LUE HEP with 25% verbal cues or less for proper execution. Baseline: Goal status: INITIAL  2.  Patient will independently recall at least 2 compensatory strategies for visual impairment without cueing. Baseline:  Goal status: INITIAL  3.  Pt will independently recall at least 1 strategy for L inattention.  Baseline:  Goal status: INITIAL  LONG TERM GOALS: Target  date: 04/14/2023  Patient will demonstrate updated LUE HEP with 25% verbal cues or less for proper execution. Baseline:  Goal status: INITIAL  2.  Pt will complete FOTO at time of discharge.  Baseline:  Goal status: INITIAL  3.  Patient will complete nine-hole peg with use of L in 35 seconds or less. Baseline: 44.6 seconds Goal status: INITIAL  4.  Patient will demonstrate at least 55 lbs L grip strength as needed to open jars and other containers. Baseline: 41 lbs Goal status: INITIAL  5.  Pt will report improved writing legibility with use of L and AD as needed to at least 75% Baseline: 25% legibility Goal status: INITIAL   ASSESSMENT:  CLINICAL IMPRESSION: 02/01/23: He does seem to be performing slightly better with the left hand now, but it is difficult progress as he this uses the left thumb and hand still.  He also needs cues for memory and disuse.  New putty may help.  We have also begun to address functional/cognitive issues of sequencing tasks at home, with exercises, and in the kitchen. 01/25/23: He states that his vision is no longer an issue for him however neglect is still present to a mild degree.  We will continue plan of care focusing mainly on the left hand fine motor skill coordination, handwriting, neglect symptoms and functional activities.  PERFORMANCE DEFICITS: in functional skills including ADLs, IADLs, coordination, proprioception, ROM, strength, pain, Fine motor control, mobility, endurance, decreased knowledge of precautions, vision, and UE functional use.    PLAN:  OT FREQUENCY: 2x/week  OT  DURATION: 12 weeks  PLANNED INTERVENTIONS: self care/ADL training, therapeutic exercise, therapeutic activity, neuromuscular re-education, functional mobility training, electrical stimulation, ultrasound, moist heat, patient/family education, cognitive remediation/compensation, visual/perceptual remediation/compensation, DME and/or AE instructions, and  Re-evaluation  CONSULTED AND AGREED WITH PLAN OF CARE: Patient and family member/caregiver  PLAN FOR NEXT SESSION:  Review in hand manipulations and therapy putty activities as needed.  Continue address functional/cognitive activities-sequencing and breaking down larger activities into small steps especially for kitchen duties.   Benito Mccreedy, OT 02/01/2023, 5:02 PM

## 2023-02-01 ENCOUNTER — Encounter: Payer: Self-pay | Admitting: Rehabilitative and Restorative Service Providers"

## 2023-02-01 ENCOUNTER — Ambulatory Visit: Payer: Medicare Other | Admitting: Physical Therapy

## 2023-02-01 ENCOUNTER — Encounter: Payer: Self-pay | Admitting: Speech Pathology

## 2023-02-01 ENCOUNTER — Ambulatory Visit: Payer: Medicare Other | Admitting: Rehabilitative and Restorative Service Providers"

## 2023-02-01 ENCOUNTER — Ambulatory Visit: Payer: Medicare Other | Admitting: Speech Pathology

## 2023-02-01 DIAGNOSIS — R2689 Other abnormalities of gait and mobility: Secondary | ICD-10-CM | POA: Diagnosis not present

## 2023-02-01 DIAGNOSIS — R4701 Aphasia: Secondary | ICD-10-CM | POA: Diagnosis not present

## 2023-02-01 DIAGNOSIS — R41842 Visuospatial deficit: Secondary | ICD-10-CM | POA: Diagnosis not present

## 2023-02-01 DIAGNOSIS — M6281 Muscle weakness (generalized): Secondary | ICD-10-CM

## 2023-02-01 DIAGNOSIS — R2681 Unsteadiness on feet: Secondary | ICD-10-CM | POA: Diagnosis not present

## 2023-02-01 DIAGNOSIS — I69352 Hemiplegia and hemiparesis following cerebral infarction affecting left dominant side: Secondary | ICD-10-CM | POA: Diagnosis not present

## 2023-02-01 DIAGNOSIS — R278 Other lack of coordination: Secondary | ICD-10-CM | POA: Diagnosis not present

## 2023-02-01 DIAGNOSIS — R41841 Cognitive communication deficit: Secondary | ICD-10-CM | POA: Diagnosis not present

## 2023-02-01 DIAGNOSIS — R29818 Other symptoms and signs involving the nervous system: Secondary | ICD-10-CM | POA: Diagnosis not present

## 2023-02-01 NOTE — Therapy (Signed)
OUTPATIENT PHYSICAL THERAPY NEURO TREATMENT   Patient Name: [facility] MRN: [id number] DOB:31-Jan-1941, 82 y.o., male Today's Date: 02/01/2023   PCP: [name], MD REFERRING PROVIDER: Jolaine Artist, MD  END OF SESSION:  PT End of Session - 02/01/23 1323     Visit Number 8    Number of Visits 17    Date for PT Re-Evaluation 03/05/23    Authorization Type BCBS Medicare (needs 10th visit PN)    Progress Note Due on Visit 10    PT Start Time 93   Pt arrived late   PT Stop Time 1400    PT Time Calculation (min) 40 min    Equipment Utilized During Treatment Gait belt    Activity Tolerance Patient tolerated treatment well    Behavior During Therapy WFL for tasks assessed/performed               Past Medical History:  Diagnosis Date   AAA (abdominal aortic aneurysm) ([place])    last u/s done 07/18/17    Aneurysm of left internal iliac artery ([place]) 05/22/2022   Arthritis    Bradycardia    CVA (cerebral vascular accident) ([place]) 10/04/2022   Dysrhythmia    frequent PAC, for 20 years   GERD (gastroesophageal reflux disease)    occ, OTC   Hemorrhoids    History of hiatal hernia    Hyperlipidemia    Hypertension    S/P aortic dissection repair 09/18/2022   Seasonal allergies    Type 1 dissection of thoracic aorta ([place]) 10/26/2022   Past Surgical History:  Procedure Laterality Date   ABDOMINAL AORTIC ENDOVASCULAR STENT GRAFT N/A 05/04/2020   Procedure: ABDOMINAL AORTIC ENDOVASCULAR STENT GRAFT;  Surgeon: [name], MD;  Location: [facility];  Service: Vascular;  Laterality: N/A;   CATARACT EXTRACTION Bilateral 2009   DIAGNOSTIC LAPAROSCOPY     laparoscopic hernia repair   EYE SURGERY Bilateral    cataract removal   HERNIA REPAIR Bilateral 1999, 2006   JOINT REPLACEMENT Right ~2018   hip replacement   pheochromocytoma  1993   PROSTATECTOMY N/A 05/15/2013   Procedure: PROSTATECTOMY RETROPUBIC; SIMPLE OPEN PROSTATECTOMY;  Surgeon: [name], MD;   Location: [facility];  Service: Urology;  Laterality: N/A;   REPAIR OF ACUTE ASCENDING THORACIC AORTIC DISSECTION N/A 09/17/2022   Procedure: REPAIR OF ACUTE ASCENDING THORACIC AORTIC DISSECTION USING 28 MM HEMASHIELD PLATINUM VASCULAR GRAFT;  Surgeon: [name], MD;  Location: [facility];  Service: Vascular;  Laterality: N/A;  Median sternotomy   TOTAL ELBOW REPLACEMENT Left    > 30 years ago   TOTAL HIP ARTHROPLASTY Right 09/08/2017   Procedure: RIGHT TOTAL HIP ARTHROPLASTY ANTERIOR APPROACH;  Surgeon: [name], MD;  Location: [facility];  Service: Orthopedics;  Laterality: Right;   ULTRASOUND GUIDANCE FOR VASCULAR ACCESS Bilateral 05/04/2020   Procedure: ULTRASOUND GUIDANCE FOR VASCULAR ACCESS;  Surgeon: [name], MD;  Location: [facility];  Service: Vascular;  Laterality: Bilateral;   Patient Active Problem List   Diagnosis Date Noted   Type 1 dissection of thoracic aorta ([place]) 10/26/2022   CVA (cerebral vascular accident) ([place]) 10/04/2022   Sepsis with acute renal failure without septic shock ([place]) 09/30/2022   S/P aortic dissection repair 09/18/2022   Status post surgery 09/17/2022   Aneurysm of left internal iliac artery ([place]) 05/22/2022   Colon cancer screening 05/22/2022   Essential hypertension 05/22/2022   Hemorrhoids 05/22/2022   Inguinal hernia 05/22/2022   Irritable bowel  syndrome 05/22/2022   Mixed hyperlipidemia 05/22/2022   Pure hypercholesterolemia 05/22/2022   Thrombocytopenia ([place]) 05/22/2022   Allergic rhinitis 05/22/2022   AAA (abdominal aortic aneurysm) without rupture ([place]) 05/04/2020   Trochanteric bursitis, right hip 08/20/2018   History of right hip replacement 08/20/2018   Status post total replacement of right hip 09/08/2017   Pain of right hip joint 08/07/2017   Unilateral primary osteoarthritis, right hip 08/07/2017   BPH (benign prostatic hypertrophy) with urinary obstruction 05/15/2013    ONSET DATE: 12/28/2022 (CVA on  09/17/22)  REFERRING DIAG: I63.19 (ICD-10-CM) - Cerebrovascular accident (CVA) due to embolism of other precerebral artery ([place])  THERAPY DIAG:  Unsteadiness on feet  Other abnormalities of gait and mobility  Rationale for Evaluation and Treatment: Rehabilitation  SUBJECTIVE:                                                                                                                                                                                             SUBJECTIVE STATEMENT: Pt ambulated into clinic without using cane. Pt denies falls/near falls.  Reports he has been working on midline orientation by looking at the position of his belt buckle and shoulders, but has not been doing his HEP.    Pt accompanied by:  Wife, [facility]   PERTINENT HISTORY:  Pt is a 82 y/o M [facility] with type 1 aortic dissection requiring emergent repair. Head CT 11/6 acute infarcts within R MCA involvement, including R cerebellum, basal ganglia, frontal lobe. ETT 11/4-11/9. PMH AAA s/p endovascular repair (2020), HTN, HLD, GERD. Hospitalized 09/18/23 to 10/21/23.Had [facility], PT, SLP.    PAIN:  Are you having pain? No  VITALS There were no vitals filed for this visit.   PRECAUTIONS: Fall  OBJECTIVE:  TODAY'S TREATMENT:    Ther Act STG Assessment   [facility] PT Assessment - 02/01/23 1332       Ambulation/Gait   Gait velocity 32.8' over 10.53s = 3.11 ft/s no AD      Functional Gait  Assessment   Gait assessed  Yes    Gait Level Surface Walks 20 ft in less than 7 sec but greater than 5.5 sec, uses assistive device, slower speed, mild gait deviations, or deviates 6-10 in outside of the 12 in walkway width.   6.72s   Change in Gait Speed Able to smoothly change walking speed without loss of balance or gait deviation. Deviate no more than 6 in outside of the 12 in walkway width.    Gait with Horizontal Head Turns Performs head turns smoothly with slight change in gait velocity (eg, minor  disruption to smooth gait path), deviates 6-10 in outside 12 in walkway width, or uses an assistive device.    Gait with Vertical Head Turns Performs head turns with no change in gait. Deviates no more than 6 in outside 12 in walkway width.    Gait and Pivot Turn Pivot turns safely within 3 sec and stops quickly with no loss of balance.    Step Over Obstacle Is able to step over 2 stacked shoe boxes taped together (9 in total height) without changing gait speed. No evidence of imbalance.    Gait with Narrow Base of Support Ambulates less than 4 steps heel to toe or cannot perform without assistance.    Gait with Eyes Closed Walks 20 ft, slow speed, abnormal gait pattern, evidence for imbalance, deviates 10-15 in outside 12 in walkway width. Requires more than 9 sec to ambulate 20 ft.   11.25s   Ambulating Backwards Walks 20 ft, uses assistive device, slower speed, mild gait deviations, deviates 6-10 in outside 12 in walkway width.    Steps Alternating feet, no rail.    Total Score 22    FGA comment: Medium fall risk            STAIRS:  Level of Assistance: Modified independence and SBA  Stair Negotiation Technique: Step to Pattern Alternating Pattern  with No Rails Single Rail on Left Bilateral Rails  Number of Stairs: 12   Height of Stairs: 6"  Comments: Pt able to perform alternating pattern with or without rails  but reports not feeling as stable when descending without rails. Educated pt on performing step-to pattern while descending without rails, which pt performed well but was unable to teach back to therapist despite repeated cues.   Gait pattern: step through pattern, decreased arm swing- Left, decreased stride length, decreased hip/knee flexion- Left, lateral lean- Right, decreased trunk rotation, and trunk flexed Distance walked: >750' outside, up/down curb and ramp and around clinic  Assistive device utilized: None Level of assistance: SBA Comments: Pt demonstrated improved  scanning to L side while ambulating outside and had no instability while navigating ramp and curb   Verbally reviewed HEP, as pt has not been performing at home. Provided education on rationale for working on vestibular input and narrow BOS, but pt will benefit from further education.    PATIENT EDUCATION: Education details:  Goal outcomes, stair navigation techniques, importance of performing HEP Person educated: Patient and Spouse Education method: Explanation, Verbal cues, and Handouts Education comprehension: verbalized understanding and needs further education  HOME EXERCISE PROGRAM: Access Code: [email or web address] URL: [email or web address] Date: 01/16/2023 Prepared by: Elease Etienne  Exercises - Standing Hamstring Curl with Resistance  - 1 x daily - 5 x weekly - 2 sets - 12 reps - Corner Balance Feet Together With Eyes Closed  - 1 x daily - 5 x weekly - 1 sets - 2 reps - 30-45 seconds hold - Standing Tandem Balance with Counter Support  - 1 x daily - 5 x weekly - 1 sets - 2 reps - 30-45 seconds hold - Heel Raises with Counter Support  - 1 x daily - 5 x weekly - 1-2 sets - 12 reps  GOALS: Goals reviewed with patient? Yes  SHORT TERM GOALS: Target date: 02/02/23  Pt will be IND with initial HEP in order to indicate improved functional mobility and dec fall risk. Baseline: Pt not performing regularly  Goal status: IN PROGRESS  2.  Pt will ambulate 150' without AD mod  I level in order to indicate more independent household ambulation.   Baseline:  Goal status: MET  3.  Pt will improve FGA to >/=23/30 in order to indicate dec fall risk.  Baseline: 19/30; 22/30 (3/20) Goal status: NOT MET  4.  Pt will maintain gait speed to >/=3.0 ft/sec w/o AD while scanning environment without overt LOB in order to indicate dec fall risk.  Baseline: 3.11 ft/s on 3/20  Goal status: MET  5.  Will assess MCTSIB and update goals to reflect progress.  Baseline: Pt scored 120/120 at  baseline  Goal status: MET  6.  Pt will negotiate up/down 4 steps with single rail in alt pattern, up/down ramp and curb and ambulate x 500' outdoors over unlevel paved surfaces at S level without AD in order to indicate improved community mobility.   Baseline:  Goal status: MET  LONG TERM GOALS: Target date: 03/05/23  Pt will be IND with final HEP in order to indicate improved functional mobility and dec fall risk. Baseline:  Goal status: INITIAL  2.  Pt will improve FGA to >/=26/30 in order to indicate dec fall risk.   Baseline: 19/30 Goal status: INITIAL  3.  Will add LTG for MTCSIB once assessed.  Baseline: pt scored 120/120 at baseline Goal status: MET  4.  Pt will negotiate up/down 8 steps with single rail, up/down ramp and curb and ambulate x 1000' outdoors over unlevel paved surfaces without AD at mod I level while scanning environment in order to indicate improved community mobility.   Baseline:  Goal status: INITIAL    ASSESSMENT:  CLINICAL IMPRESSION: Emphasis of skilled PT session on STG assessment and verbally reviewing HEP. Pt has met 4 of 6 LTGs, demonstrating improved gait speed and scanning of L side in outdoor environment and on unlevel surfaces. Pt reports noncompliance w/HEP, so pt is progressing towards STG #1. Pt also scored a 22/30 on FGA, narrowly missing his goal of 23/20. Pt continues to have the most difficulty w/tandem gait, EC and head turns. However, pt has not been working on these at home. Continue POC.   OBJECTIVE IMPAIRMENTS: Abnormal gait, cardiopulmonary status limiting activity, decreased activity tolerance, decreased balance, decreased endurance, decreased mobility, decreased strength, and impaired perceived functional ability.   ACTIVITY LIMITATIONS: carrying, lifting, standing, squatting, stairs, bathing, and locomotion level  PARTICIPATION LIMITATIONS: meal prep, medication management, personal finances, driving, shopping, community  activity, and yard work  PERSONAL FACTORS: Age and 3+ comorbidities: see above  are also affecting patient's functional outcome.   REHAB POTENTIAL: Good  CLINICAL DECISION MAKING: Evolving/moderate complexity  EVALUATION COMPLEXITY: Moderate  PLAN:  PT FREQUENCY: 2x/week  PT DURATION: 8 weeks  PLANNED INTERVENTIONS: Therapeutic exercises, Therapeutic activity, Neuromuscular re-education, Balance training, Gait training, Patient/Family education, Self Care, and Joint mobilization  PLAN FOR NEXT SESSION:  Review HEP. Add to HEP prn, outdoor gait-dual task, obstacle course, rebounder drills, blaze pods in // bars-add obstacle course and UE/LE distinction tasks w/ distracting colors, tilt board ball toss w/ cognitive dual task  Charlett Nose, PT, [facility] [address] [place], [place]  [address] Phone:  [phone] Fax:  [phone] 02/01/23, 2:01 PM

## 2023-02-01 NOTE — Patient Instructions (Signed)
   Language is in the left side of the brain  90% of left handed people have language in the left side of the brain  10% of left handed people have language in the right side of the brain  [facility] seems to have his language in he right side of his brain

## 2023-02-01 NOTE — Therapy (Signed)
OUTPATIENT SPEECH LANGUAGE PATHOLOGY TREATMENT NOTE   Patient Name: James Estrada MRN: [id number] DOB:06-10-1941, 82 y.o., male Today's Date: 02/01/2023  PCP: [name], MD REFERRING PROVIDER: [name], MD  END OF SESSION:   End of Session - 02/01/23 1406     Visit Number 11    Number of Visits 25    Date for SLP Re-Evaluation 03/20/23    SLP Start Time 63    SLP Stop Time  [id number]    SLP Time Calculation (min) 45 min    Activity Tolerance Patient tolerated treatment well             Past Medical History:  Diagnosis Date   AAA (abdominal aortic aneurysm) ([place])    last u/s done 07/18/17    Aneurysm of left internal iliac artery ([place]) 05/22/2022   Arthritis    Bradycardia    CVA (cerebral vascular accident) ([place]) 10/04/2022   Dysrhythmia    frequent PAC, for 20 years   GERD (gastroesophageal reflux disease)    occ, OTC   Hemorrhoids    History of hiatal hernia    Hyperlipidemia    Hypertension    S/P aortic dissection repair 09/18/2022   Seasonal allergies    Type 1 dissection of thoracic aorta ([place]) 10/26/2022   Past Surgical History:  Procedure Laterality Date   ABDOMINAL AORTIC ENDOVASCULAR STENT GRAFT N/A 05/04/2020   Procedure: ABDOMINAL AORTIC ENDOVASCULAR STENT GRAFT;  Surgeon: [name], MD;  Location: [place];  Service: Vascular;  Laterality: N/A;   CATARACT EXTRACTION Bilateral 2009   DIAGNOSTIC LAPAROSCOPY     laparoscopic hernia repair   EYE SURGERY Bilateral    cataract removal   HERNIA REPAIR Bilateral 1999, 2006   JOINT REPLACEMENT Right ~2018   hip replacement   pheochromocytoma  1993   PROSTATECTOMY N/A 05/15/2013   Procedure: PROSTATECTOMY RETROPUBIC; SIMPLE OPEN PROSTATECTOMY;  Surgeon: [name], MD;  [facility];  Service: Urology;  Laterality: N/A;   REPAIR OF ACUTE ASCENDING THORACIC AORTIC DISSECTION N/A 09/17/2022   Procedure: REPAIR OF ACUTE ASCENDING THORACIC AORTIC DISSECTION USING 28 MM HEMASHIELD PLATINUM  VASCULAR GRAFT;  Surgeon: [name], MD;  Location: [place];  Service: Vascular;  Laterality: N/A;  Median sternotomy   TOTAL ELBOW REPLACEMENT Left    > 30 years ago   TOTAL HIP ARTHROPLASTY Right 09/08/2017   Procedure: RIGHT TOTAL HIP ARTHROPLASTY ANTERIOR APPROACH;  Surgeon: [name], MD;  Location[facility];  Service: Orthopedics;  Laterality: Right;   ULTRASOUND GUIDANCE FOR VASCULAR ACCESS Bilateral 05/04/2020   Procedure: ULTRASOUND GUIDANCE FOR VASCULAR ACCESS;  Surgeon: [name], MD;  [facility];  Service: Vascular;  Laterality: Bilateral;   Patient Active Problem List   Diagnosis Date Noted   Type 1 dissection of thoracic aorta ([place]) 10/26/2022   CVA (cerebral vascular accident) ([place]) 10/04/2022   Sepsis with acute renal failure without septic shock ([place]) 09/30/2022   S/P aortic dissection repair 09/18/2022   Status post surgery 09/17/2022   Aneurysm of left internal iliac artery ([place]) 05/22/2022   Colon cancer screening 05/22/2022   Essential hypertension 05/22/2022   Hemorrhoids 05/22/2022   Inguinal hernia 05/22/2022   Irritable bowel syndrome 05/22/2022   Mixed hyperlipidemia 05/22/2022   Pure hypercholesterolemia 05/22/2022   Thrombocytopenia ([place]) 05/22/2022   Allergic rhinitis 05/22/2022   AAA (abdominal aortic aneurysm) without rupture ([place]) 05/04/2020   Trochanteric bursitis, right hip 08/20/2018   History of right hip  replacement 08/20/2018   Status post total replacement of right hip 09/08/2017   Pain of right hip joint 08/07/2017   Unilateral primary osteoarthritis, right hip 08/07/2017   BPH (benign prostatic hypertrophy) with urinary obstruction 05/15/2013    ONSET DATE: 09/17/22  REFERRING DIAG: R13.10 (ICD-10-CM) - Dysphagia, unspecified  THERAPY DIAG:  Aphasia  Rationale for Evaluation and Treatment: Rehabilitation  SUBJECTIVE:   SUBJECTIVE STATEMENT: "I'm working on balance and speed"   PAIN:  Are you having  pain? No   OBJECTIVE:  Pt is a 82 y/o M admitted to [facility] on 09/17/22 with type 1 aortic dissection requiring emergent repair. Head CT 11/6 acute infarcts within R MCA involvement, including R cerebellum, basal ganglia, frontal lobe. ETT 11/4-11/9. PMH AAA s/p endovascular repair (2020), HTN, HLD, GERD.   TODAY'S TREATMENT:                                                                                                                                         DATE:   02/01/23: Targeted reading comprehension and narrative discourse reading 6-8 sentence paragraphs ([employer]) and giving 2-3 sentence cohesive summary. Lendel generated accurate summaries with occasional min questioning cues for clarification and cohesion. Verbal expression targeted with Kristoffer stating his opinion on the articles, again with occasional min questioning cues to ID empty speech and for cohesion. Targeted word finding in complex naming with a given letter in 12 categories with minimal extended time and rare min A. Emily summarized a movie plot accurately with mod I and min pausing for word finding.   01/30/23: Pt reported that he continues to experience moments of communicative difficulty in groups. Pt has been successful in summarizing brief reading passages, requested strategies on how to challenge himself. SLP discussed reading longer passages on more complex topics and then summarizing the material to a family member.  Targeted anomia, complex sentence generation utilizing VNeST (target verb: pour, monitor, defend, climb) to generate 3 subjects and objects for each verb for a total of 12. Given rare min A, pt generated three subject-object pairs. Pt then answered when, where, why questions for each verb given rare min-A to generate 4 complex sentences.   01/25/23: Yuven reports success communicating with small group of friends at dinner out, he also communicated with waiter that his food was not right and got his meal  corrected. He reports improved confidence. They are practicing reading at paragraph level at home. Today, targeted word finding in complex/abstract divergent naming tasks - Cavan named 5-6 items in complex category with extended time usual  min semantic cues from spouse and ST, for 5 categories. Narrative about his motorcycle days with improved fluency and cohesion. He required 3 questioning cues for clarification and used compensations for aphasia 3x with usual questioning cues.   3/11/24[name] used dictation to send 5 texts to family members with success. He sent a longer  more detailed text to his son re: a problem which he reported was vague and less accurate, however his son did understand the problem. Targeted word finding and written expression generating 10 items in category - Jaydn used siri to aid in spelling as needed independently. With occasional min semantic cues Yasiin names/wrote 10/10 words. Targeted reading comprehension reading 4 phrase-sentence steps in social tasks and placing them in sequential order - with extended time and rare min A, Shigeo comprehended 4/4 4-step sequences. In conversation re: his boat, Haseeb used verbal compensations for aphasia 2x as needed ("the place where everyone ties up their boat/marina, the club my father was a member - [employer]). With written cues, Serenity named 5 boating/nautical words.   3/6/24[name] completed HW - generating sentence with 2 words. Targeted divergent naming in moderately complex categories and written expression at the word level [name] generated 6 items in each category independently and 8 for each with semantic cues for the remaining 2 (goal for this was 8 - items in deli and items in bakery as he did grocery shopping before the stroke). Jotham generated (verbally) 6 ingredients for shrimp scampi (a dish he makes) with extended time and rare min A To target complex sentence generation, word finding, reading  comprehension  Pharmacist, hospital (VNeST) was utilized. The pt generated 3 subjects and objects for 2 verbs (pull, scratch), for a total of 6 subject objects. Pt required occasional min semantic cues. Pt generated 2 complex sentences by answering "wh" questions. Pt required extended time and rare min  cues to generate complex sentences. Rushabh is to text 3 family members, using [employer] if he needs help spelling or having [name] check his texts an he is to correct errors on sentence generation homework.    PATIENT EDUCATION: Education details: See patient instructions, see today's treatment, compensations for aphasia Person educated: Patient and Spouse Education method: Explanation, Verbal cues, and Handouts Education comprehension: verbal cues required and needs further education    Speech Therapy Progress Note  Dates of Reporting Period: 12/26/22 to 01/30/23  Objective Reports of Subjective Statement: Spouse and patient report improved confidence and participation in group conversations  Objective Measurements: See goals  Goal Update: continue goal  Plan: continue POC  Reason Skilled Services are Required: Improving aphasia, improving sentence structure. Mikah continues to avoid phone calls, has difficulty communicating in groups and in fast moving conversations  GOALS: Goals reviewed with patient? Yes   SHORT TERM GOALS: Target date: 01/23/23   Pt will name 10 items in personally relevant categories over 3 sessions with occasional min A Baseline: Goal status: MET   2.  Pt will write 4 item grocery list with occasional min A Baseline:  Goal status: MET   3.  Pt will respond to 3 texts with occasional min A over 1 week Baseline:  Goal status: MET   4.  Pt will generate complex verbal sentences 3x using Pharmacist, hospital with rare min A over 2 sessions Baseline:  Goal status: MET   5.  Pt will comprehend 4 sentence passage/email with extended  time and rare min A Baseline:  Goal status: MET   6.  Pt will employ compensations for aphasia in structured language tasks with occasional min A Baseline:  Goal status: MET   LONG TERM GOALS: Target date: 03/20/23   Pt will complete complex naming tasks with 80% accuracy and occasional min A Baseline:  Goal status: ONGOING   2.  Pt will carryover verbal compensations for aphasia with occasional min A in conversation as needed over 2 sessions Baseline:  Goal status: ONGOING   3.  Pt will carryover 3 strategies to successfully communicate in community with clerks, servers etc Baseline:  Goal status: ONGOING   4.  Pt will write 3 sentence text or email with occasional min A, external aids and occasional min A  Baseline:  Goal status: ONGOING   5.  Pt will improve score on Communicative Participation Item Bank by 4 points Baseline: 14/30 Goal status: ONGOING       ASSESSMENT:   CLINICAL IMPRESSION: Patient is a 82 y.o. male who was seen today for aphasia s/p CVA. He is accompanied by his spouse [name]. Prior to CVA, Romulo was active golfing, managing finances,  keeping up with the stock market and socializing with friends. Aphasia improved to mild aphasia. Continued improvement in word finding, reading comprehension and written expression. He is now texitng family regularly with success. Ongoing training in compensations for aphasia and targeting written and verbal expression as well as reading comprehension. Short term goals met. I recommend skilled ST to maximize verbal and written communication and cognition for safety, to reduce caregiver burden and QOL.    OBJECTIVE IMPAIRMENTS: include memory and aphasia. These impairments are limiting patient from managing medications, managing appointments, managing finances, household responsibilities, ADLs/IADLs, and effectively communicating at home and in community. Factors affecting potential to achieve goals and functional outcome are  ability to learn/carryover information. Patient will benefit from skilled SLP services to address above impairments and improve overall function.   REHAB POTENTIAL: Good   PLAN:   SLP FREQUENCY: 2x/week   SLP DURATION: 12 weeks   PLANNED INTERVENTIONS: Language facilitation, Environmental controls, Cueing hierachy, Cognitive reorganization, Internal/external aids, Functional tasks, and Multimodal communication approach         Rasheda Ledger, Annye Rusk, CCC-SLP 02/01/2023, 2:51 PM

## 2023-02-06 ENCOUNTER — Ambulatory Visit: Payer: Medicare Other | Admitting: Physical Therapy

## 2023-02-06 VITALS — BP 116/76 | HR 67

## 2023-02-06 DIAGNOSIS — R2689 Other abnormalities of gait and mobility: Secondary | ICD-10-CM

## 2023-02-06 DIAGNOSIS — R278 Other lack of coordination: Secondary | ICD-10-CM

## 2023-02-06 DIAGNOSIS — R2681 Unsteadiness on feet: Secondary | ICD-10-CM

## 2023-02-06 NOTE — Therapy (Signed)
OUTPATIENT PHYSICAL THERAPY NEURO TREATMENT- ARRIVED NO CHARGE   Patient Name: James Estrada MRN: [id number] DOB:January 05, 1941, 82 y.o., male Today's Date: 02/06/2023   PCP: James Carol, MD REFERRING PROVIDER: Jolaine Artist, MD  END OF SESSION:  PT End of Session - 02/06/23 1234     Visit Number 8   Arrived no charge   Number of Visits 17    Date for PT Re-Evaluation 03/05/23    Authorization Type BCBS Medicare (needs 10th visit PN)    Progress Note Due on Visit 10    PT Start Time 1233    PT Stop Time 1257   Arrived no charge   PT Time Calculation (min) 24 min    Equipment Utilized During Treatment Gait belt    Activity Tolerance Other (comment)   Pt reporting lightheadedness and not feeling 100%   Behavior During Therapy [facility] for tasks assessed/performed                Past Medical History:  Diagnosis Date   AAA (abdominal aortic aneurysm) ([place])    last u/s done 07/18/17    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) 05/22/2022   Arthritis    Bradycardia    CVA (cerebral vascular accident) ([place]) 10/04/2022   Dysrhythmia    frequent PAC, for 20 years   GERD (gastroesophageal reflux disease)    occ, OTC   Hemorrhoids    History of hiatal hernia    Hyperlipidemia    Hypertension    S/P aortic dissection repair 09/18/2022   Seasonal allergies    Type 1 dissection of thoracic aorta ([place]) 10/26/2022   Past Surgical History:  Procedure Laterality Date   ABDOMINAL AORTIC ENDOVASCULAR STENT GRAFT N/A 05/04/2020   Procedure: ABDOMINAL AORTIC ENDOVASCULAR STENT GRAFT;  Surgeon: James Posner, MD;  Location: [place];  Service: Vascular;  Laterality: N/A;   CATARACT EXTRACTION Bilateral 2009   DIAGNOSTIC LAPAROSCOPY     laparoscopic hernia repair   EYE SURGERY Bilateral    cataract removal   HERNIA REPAIR Bilateral 1999, 2006   JOINT REPLACEMENT Right ~2018   hip replacement   pheochromocytoma  1993   PROSTATECTOMY N/A 05/15/2013   Procedure: PROSTATECTOMY  RETROPUBIC; SIMPLE OPEN PROSTATECTOMY;  Surgeon: James Amass, MD;  [facility];  Service: Urology;  Laterality: N/A;   REPAIR OF ACUTE ASCENDING THORACIC AORTIC DISSECTION N/A 09/17/2022   Procedure: REPAIR OF ACUTE ASCENDING THORACIC AORTIC DISSECTION USING 28 MM HEMASHIELD PLATINUM VASCULAR GRAFT;  Surgeon: James Nakayama, MD;  Location: [place];  Service: Vascular;  Laterality: N/A;  Median sternotomy   TOTAL ELBOW REPLACEMENT Left    > 30 years ago   TOTAL HIP ARTHROPLASTY Right 09/08/2017   Procedure: RIGHT TOTAL HIP ARTHROPLASTY ANTERIOR APPROACH;  Surgeon: James Rossetti, MD;  [facility];  Service: Orthopedics;  Laterality: Right;   ULTRASOUND GUIDANCE FOR VASCULAR ACCESS Bilateral 05/04/2020   Procedure: ULTRASOUND GUIDANCE FOR VASCULAR ACCESS;  Surgeon: James Posner, MD;  [facility];  Service: Vascular;  Laterality: Bilateral;   Patient Active Problem List   Diagnosis Date Noted   Type 1 dissection of thoracic aorta ([place]) 10/26/2022   CVA (cerebral vascular accident) ([place]) 10/04/2022   Sepsis with acute renal failure without septic shock ([place]) 09/30/2022   S/P aortic dissection repair 09/18/2022   Status post surgery 09/17/2022   Aneurysm of left internal iliac artery ([place]) 05/22/2022   Colon cancer screening 05/22/2022   Essential hypertension  05/22/2022   Hemorrhoids 05/22/2022   Inguinal hernia 05/22/2022   Irritable bowel syndrome 05/22/2022   Mixed hyperlipidemia 05/22/2022   Pure hypercholesterolemia 05/22/2022   Thrombocytopenia ([place]) 05/22/2022   Allergic rhinitis 05/22/2022   AAA (abdominal aortic aneurysm) without rupture ([place]) 05/04/2020   Trochanteric bursitis, right hip 08/20/2018   History of right hip replacement 08/20/2018   Status post total replacement of right hip 09/08/2017   Pain of right hip joint 08/07/2017   Unilateral primary osteoarthritis, right hip 08/07/2017   BPH (benign prostatic hypertrophy) with urinary  obstruction 05/15/2013    ONSET DATE: 12/28/2022 (CVA on 09/17/22)  REFERRING DIAG: I63.19 (ICD-10-CM) - Cerebrovascular accident (CVA) due to embolism of other precerebral artery ([place])  THERAPY DIAG:  Unsteadiness on feet  Other abnormalities of gait and mobility  Other lack of coordination  Rationale for Evaluation and Treatment: Rehabilitation  SUBJECTIVE:                                                                                                                                                                                             SUBJECTIVE STATEMENT: Pt ambulated into clinic without using cane. Pt reports working on HEP a bit this weekend. No falls.    Pt accompanied by:  Wife, James Estrada   PERTINENT HISTORY:  Pt is a 82 y/o M admitted to [facility] on 09/17/22 with type 1 aortic dissection requiring emergent repair. Head CT 11/6 acute infarcts within R MCA involvement, including R cerebellum, basal ganglia, frontal lobe. ETT 11/4-11/9. PMH AAA s/p endovascular repair (2020), HTN, HLD, GERD. Hospitalized 09/18/23 to 10/21/23.Had [place], PT, SLP.    PAIN:  Are you having pain? No  VITALS Vitals:   02/06/23 1250 02/06/23 1301  BP: 121/77 116/76  Pulse: 65 67     PRECAUTIONS: Fall  OBJECTIVE:  TODAY'S TREATMENT:    NMR - ARRIVED NO CHARGE In // bars, performed tandem gait in fwd and retro direction x2 using mirror as visual biofeedback for midline orientation w/CGA for safety. During activity, pt reported feeling lightheaded, so assessed vitals (see above) and systolic BP elevated in seated position. Then assessed vitals in standing and systolic BP in orthostatic range but diastolic BP steady. Pt reported going to gym earlier in day but has not eaten since, so pt's wife requested to leave early in order to get lunch prior to his dentist appointment at 2.    PATIENT EDUCATION: Education details:  Continue HEP Person educated: Patient and Spouse Education method: Explanation,  Verbal cues, and Handouts Education comprehension: verbalized understanding and needs further education  HOME EXERCISE PROGRAM: Access Code: [email or web address] URL:  [email or web address] Date: 01/16/2023 Prepared by: Elease Etienne  Exercises - Standing Hamstring Curl with Resistance  - 1 x daily - 5 x weekly - 2 sets - 12 reps - Corner Balance Feet Together With Eyes Closed  - 1 x daily - 5 x weekly - 1 sets - 2 reps - 30-45 seconds hold - Standing Tandem Balance with Counter Support  - 1 x daily - 5 x weekly - 1 sets - 2 reps - 30-45 seconds hold - Heel Raises with Counter Support  - 1 x daily - 5 x weekly - 1-2 sets - 12 reps  GOALS: Goals reviewed with patient? Yes  SHORT TERM GOALS: Target date: 02/02/23  Pt will be IND with initial HEP in order to indicate improved functional mobility and dec fall risk. Baseline: Pt not performing regularly  Goal status: IN PROGRESS  2.  Pt will ambulate 150' without AD mod I level in order to indicate more independent household ambulation.   Baseline:  Goal status: MET  3.  Pt will improve FGA to >/=23/30 in order to indicate dec fall risk.  Baseline: 19/30; 22/30 (3/20) Goal status: NOT MET  4.  Pt will maintain gait speed to >/=3.0 ft/sec w/o AD while scanning environment without overt LOB in order to indicate dec fall risk.  Baseline: 3.11 ft/s on 3/20  Goal status: MET  5.  Will assess MCTSIB and update goals to reflect progress.  Baseline: Pt scored 120/120 at baseline  Goal status: MET  6.  Pt will negotiate up/down 4 steps with single rail in alt pattern, up/down ramp and curb and ambulate x 500' outdoors over unlevel paved surfaces at S level without AD in order to indicate improved community mobility.   Baseline:  Goal status: MET  LONG TERM GOALS: Target date: 03/05/23  Pt will be IND with final HEP in order to indicate improved functional mobility and dec fall risk. Baseline:  Goal status: INITIAL  2.  Pt  will improve FGA to >/=26/30 in order to indicate dec fall risk.   Baseline: 19/30 Goal status: INITIAL  3.  Will add LTG for MTCSIB once assessed.  Baseline: pt scored 120/120 at baseline Goal status: MET  4.  Pt will negotiate up/down 8 steps with single rail, up/down ramp and curb and ambulate x 1000' outdoors over unlevel paved surfaces without AD at mod I level while scanning environment in order to indicate improved community mobility.   Baseline:  Goal status: INITIAL    ASSESSMENT:  CLINICAL IMPRESSION: Arrived no charge due to pt not feeling well   OBJECTIVE IMPAIRMENTS: Abnormal gait, cardiopulmonary status limiting activity, decreased activity tolerance, decreased balance, decreased endurance, decreased mobility, decreased strength, and impaired perceived functional ability.   ACTIVITY LIMITATIONS: carrying, lifting, standing, squatting, stairs, bathing, and locomotion level  PARTICIPATION LIMITATIONS: meal prep, medication management, personal finances, driving, shopping, community activity, and yard work  PERSONAL FACTORS: Age and 3+ comorbidities: see above  are also affecting patient's functional outcome.   REHAB POTENTIAL: Good  CLINICAL DECISION MAKING: Evolving/moderate complexity  EVALUATION COMPLEXITY: Moderate  PLAN:  PT FREQUENCY: 2x/week  PT DURATION: 8 weeks  PLANNED INTERVENTIONS: Therapeutic exercises, Therapeutic activity, Neuromuscular re-education, Balance training, Gait training, Patient/Family education, Self Care, and Joint mobilization  PLAN FOR NEXT SESSION:  Review HEP. Add to HEP prn, outdoor gait-dual task, obstacle course, rebounder drills, blaze pods in // bars-add obstacle course and UE/LE distinction tasks w/ distracting colors, tilt board ball toss w/ cognitive  dual task  Charlett Nose, PT, [employer] [address] [place], [place]  [address] Phone:  [phone] Fax:  [phone] 02/06/23,  1:01 PM

## 2023-02-08 ENCOUNTER — Ambulatory Visit: Payer: Medicare Other | Admitting: Rehabilitative and Restorative Service Providers"

## 2023-02-08 ENCOUNTER — Ambulatory Visit: Payer: Medicare Other | Admitting: Physical Therapy

## 2023-02-13 ENCOUNTER — Ambulatory Visit: Payer: Medicare Other | Admitting: Physical Therapy

## 2023-02-13 ENCOUNTER — Ambulatory Visit: Payer: Medicare Other | Attending: Internal Medicine | Admitting: Speech Pathology

## 2023-02-13 ENCOUNTER — Ambulatory Visit: Payer: Medicare Other | Admitting: Occupational Therapy

## 2023-02-13 VITALS — BP 140/80 | HR 62

## 2023-02-13 DIAGNOSIS — R4701 Aphasia: Secondary | ICD-10-CM | POA: Insufficient documentation

## 2023-02-13 DIAGNOSIS — R2689 Other abnormalities of gait and mobility: Secondary | ICD-10-CM

## 2023-02-13 DIAGNOSIS — N3000 Acute cystitis without hematuria: Secondary | ICD-10-CM | POA: Diagnosis not present

## 2023-02-13 DIAGNOSIS — R278 Other lack of coordination: Secondary | ICD-10-CM | POA: Insufficient documentation

## 2023-02-13 DIAGNOSIS — I6319 Cerebral infarction due to embolism of other precerebral artery: Secondary | ICD-10-CM | POA: Insufficient documentation

## 2023-02-13 DIAGNOSIS — I69352 Hemiplegia and hemiparesis following cerebral infarction affecting left dominant side: Secondary | ICD-10-CM | POA: Diagnosis not present

## 2023-02-13 DIAGNOSIS — R2681 Unsteadiness on feet: Secondary | ICD-10-CM | POA: Diagnosis not present

## 2023-02-13 DIAGNOSIS — N319 Neuromuscular dysfunction of bladder, unspecified: Secondary | ICD-10-CM | POA: Diagnosis not present

## 2023-02-13 DIAGNOSIS — R41841 Cognitive communication deficit: Secondary | ICD-10-CM | POA: Diagnosis not present

## 2023-02-13 DIAGNOSIS — R29818 Other symptoms and signs involving the nervous system: Secondary | ICD-10-CM | POA: Diagnosis not present

## 2023-02-13 DIAGNOSIS — M6281 Muscle weakness (generalized): Secondary | ICD-10-CM | POA: Insufficient documentation

## 2023-02-13 DIAGNOSIS — R41842 Visuospatial deficit: Secondary | ICD-10-CM | POA: Insufficient documentation

## 2023-02-13 DIAGNOSIS — R339 Retention of urine, unspecified: Secondary | ICD-10-CM | POA: Diagnosis not present

## 2023-02-13 NOTE — Therapy (Signed)
OUTPATIENT OCCUPATIONAL THERAPY NEURO TREATMENT NOTE   Patient Name: James Estrada MRN: [id number] DOB:10/10/1941, 82 y.o., male Today's Date: 02/15/2023  PCP: [name], MD  REFERRING PROVIDER: Jolaine Artist, MD  END OF SESSION:  OT End of Session - 02/15/23 1456     Visit Number 4    Number of Visits 13    Date for OT Re-Evaluation 04/14/23    Authorization Type BC[employer] - no auth required    Progress Note Due on Visit 10    OT Start Time [id number]    OT Stop Time 1532    OT Time Calculation (min) 36 min    Equipment Utilized During Treatment --    Activity Tolerance Patient tolerated treatment well;No increased pain;Patient limited by lethargy    Behavior During Therapy [facility] for tasks assessed/performed                Past Medical History:  Diagnosis Date   AAA (abdominal aortic aneurysm)    last u/s done 07/18/17    Aneurysm of left internal iliac artery 05/22/2022   Arthritis    Bradycardia    CVA (cerebral vascular accident) 10/04/2022   Dysrhythmia    frequent PAC, for 20 years   GERD (gastroesophageal reflux disease)    occ, OTC   Hemorrhoids    History of hiatal hernia    Hyperlipidemia    Hypertension    S/P aortic dissection repair 09/18/2022   Seasonal allergies    Type 1 dissection of thoracic aorta 10/26/2022   Past Surgical History:  Procedure Laterality Date   ABDOMINAL AORTIC ENDOVASCULAR STENT GRAFT N/A 05/04/2020   Procedure: ABDOMINAL AORTIC ENDOVASCULAR STENT GRAFT;  Surgeon: [name], MD;  Location: [place];  Service: Vascular;  Laterality: N/A;   CATARACT EXTRACTION Bilateral 2009   DIAGNOSTIC LAPAROSCOPY     laparoscopic hernia repair   EYE SURGERY Bilateral    cataract removal   HERNIA REPAIR Bilateral 1999, 2006   JOINT REPLACEMENT Right ~2018   hip replacement   pheochromocytoma  1993   PROSTATECTOMY N/A 05/15/2013   Procedure: PROSTATECTOMY RETROPUBIC; SIMPLE OPEN PROSTATECTOMY;  Surgeon: [name], MD;  Location: [facility];  Service: Urology;  Laterality: N/A;   REPAIR OF ACUTE ASCENDING THORACIC AORTIC DISSECTION N/A 09/17/2022   Procedure: REPAIR OF ACUTE ASCENDING THORACIC AORTIC DISSECTION USING 28 MM HEMASHIELD PLATINUM VASCULAR GRAFT;  Surgeon: [name], MD;  Location: [place];  Service: Vascular;  Laterality: N/A;  Median sternotomy   TOTAL ELBOW REPLACEMENT Left    > 30 years ago   TOTAL HIP ARTHROPLASTY Right 09/08/2017   Procedure: RIGHT TOTAL HIP ARTHROPLASTY ANTERIOR APPROACH;  Surgeon: [name], MD;  Location: [facility];  Service: Orthopedics;  Laterality: Right;   ULTRASOUND GUIDANCE FOR VASCULAR ACCESS Bilateral 05/04/2020   Procedure: ULTRASOUND GUIDANCE FOR VASCULAR ACCESS;  Surgeon: [name], MD;  [facility] OR;  Service: Vascular;  Laterality: Bilateral;   Patient Active Problem List   Diagnosis Date Noted   Type 1 dissection of thoracic aorta 10/26/2022   CVA (cerebral vascular accident) 10/04/2022   Sepsis with acute renal failure without septic shock 09/30/2022   S/P aortic dissection repair 09/18/2022   Status post surgery 09/17/2022   Aneurysm of left internal iliac artery 05/22/2022   Colon cancer screening 05/22/2022   Essential hypertension 05/22/2022   Hemorrhoids 05/22/2022   Inguinal hernia 05/22/2022   Irritable bowel syndrome 05/22/2022   Mixed hyperlipidemia 05/22/2022  Pure hypercholesterolemia 05/22/2022   Thrombocytopenia 05/22/2022   Allergic rhinitis 05/22/2022   AAA (abdominal aortic aneurysm) without rupture 05/04/2020   Trochanteric bursitis, right hip 08/20/2018   History of right hip replacement 08/20/2018   Status post total replacement of right hip 09/08/2017   Pain of right hip joint 08/07/2017   Unilateral primary osteoarthritis, right hip 08/07/2017   BPH (benign prostatic hypertrophy) with urinary obstruction 05/15/2013    ONSET DATE: 12/28/2022 (CVA on 09/17/22)   REFERRING DIAG: I63.19 (ICD-10-CM) - Cerebrovascular  accident (CVA) due to embolism of other precerebral artery  THERAPY DIAG:  Other lack of coordination  Muscle weakness (generalized)  Hemiplegia and hemiparesis following cerebral infarction affecting left dominant side  Rationale for Evaluation and Treatment: Rehabilitation  PERTINENT HISTORY:  "Pt is a 82 y/o M admitted to [facility] on 09/17/22 with type 1 aortic dissection requiring emergent repair. Head CT 11/6 acute infarcts within R MCA involvement, including R cerebellum, basal ganglia, frontal lobe. ETT 11/4-11/9. PMH AAA s/p endovascular repair (2020), HTN, HLD, GERD. Hosoitalized 09/18/23 to 10/21/23.Had [place], PT, SLP."   Has had limited L supination since elbow fracture prior to CVA. He has been using block letters for writing but is highly motivated to work on his handwriting in therapy.    PRECAUTIONS: Fall;  WEIGHT BEARING RESTRICTIONS: No   SUBJECTIVE:   SUBJECTIVE STATEMENT: He arrives stating not feeling much progress.  He states "not connecting the dots between using Theraputty and increasing handwriting skills."  His wife however feels like he is making progress.  Pt accompanied by: significant other- Betty   PAIN:  Are you having pain? No   FALLS: Has patient fallen in last 6 months? Yes. Number of falls 1  LIVING ENVIRONMENT: Lives with: lives with their family Lives in: House/apartment (lives at daughters house-will be moving to [place] in April/May to single home).  Stairs: Yes: Internal: flight but doesn't use steps; on left going up and External: 2 steps; none Has following equipment at home: Single point cane, shower chair, Grab bars, and tub/shower    PLOF: Independent, retired Designer, fashion/clothing (lots of computer use), golf, went to the gym on a daily basis, was an avid gardener; driving   PATIENT GOALS: return to PLOF    OBJECTIVE:  (All objective assessments below are from initial evaluation on: 01/16/23 unless otherwise specified.)   HAND  DOMINANCE: Left  ADLs: Overall ADLs: mostly mod I with assistance in cutting up meat  IADLs: Shopping: max A Meal Prep: liked to cook and bake - has been able to make some meals with min A Community mobility: dependent Medication management: dependent Financial management: dependent Handwriting: 25% legible  MOBILITY STATUS: Needs Assist: SPC for community mobility  ACTIVITY TOLERANCE: Activity tolerance: Fair  FUNCTIONAL OUTCOME MEASURES: FOTO: to be completed at a later date  UPPER EXTREMITY ROM:     AROM Right (eval) Left (eval)  Shoulder flexion WNL WNL  Shoulder abduction WNL WNL  Elbow flexion WNL WNL  Elbow extension WNL WNL  Wrist flexion WNL WNL  Wrist extension WNL WNL  Wrist pronation WNL WNL  Wrist supination WNL Impaired (chronic)   Digit Composite Flexion WNL WNL  Digit Composite Extension WNL WNL  Digit Opposition WNL WNL  (Blank rows = not tested)  UPPER EXTREMITY MMT:     MMT Right (eval) Left (eval)  Shoulder flexion WNL WFL  Shoulder abduction WNL WFL  Elbow flexion WNL WFL  Elbow extension WNL WFL  (Blank  rows = not tested)  HAND FUNCTION: 02/15/23: Lt Grip Strength: 49#  Grip strength: Right: 72.9 lbs; Left: 41 lbs  COORDINATION: 02/15/23: 9HPT: Lt 33 sec  Finger Nose Finger test: impaired on L 9 Hole Peg test: Right: 33.5 sec; Left: 44.6 sec  MUSCLE TONE: LUE: Within functional limits  COGNITION: Overall cognitive status: Impaired and See ST note for further details  VISION: Subjective report: some boughts of double vision Baseline vision: Wears glasses for reading only and OTC readers Visual history: cataracts  VISION ASSESSMENT: Tracking/Visual pursuits: Decreased smoothness with horizontal tracking Visual Fields: Left visual field deficits  PERCEPTION: Impaired: Inattention/neglect: difficulty discriminating R vs L  OBSERVATIONS:  02/01/23: no cane use now, still disuses Lt dominant side at times, favoring Rt hand when  learning exercises. Some apparent confusion with directions at times and demonstrating back exercises that were just taught.    TODAY'S TREATMENT:                                                                                                                               02/15/23: To determine progress he performs nine-hole peg test fine motor skill activities as well as gripping activities today.  He performs significantly better on both which is comforting to him.  OT explains the link between strength and coordination and handwriting skills, and he is recommended to perform 5 to 10 minutes of handwriting every day 2 or 3 times in the day if he wishes to improve this skill.  OT also adds new finger abduction strength as he did have a mild tremor in the index finger when writing and this could help.  The rest of his exercises were reviewed and performed today as well including review of in hand manipulation "pen tricks."  They both state understanding this and he and his wife bring up the idea of self managing for occupational therapy goals versus coming in twice a week.  They would like to continue to work with speech and PT but consider tapering down OT.  They agreed to meet with the therapist next Monday to discuss possible issues with IADL kitchen/cooking skills and abilities/sequencing, but after that they may wish to cancel some appointments and "stretch out" his occupational therapy until not needed.  Exercises - Full Fist  - 2-3 x daily - 5 reps - Seated Claw Fist with Putty  - 2-3 x daily - 5 reps - Finger Extension "Pizza!"   - 2-3 x daily - 5 reps - Thumb Press  - 2-3 x daily - 5 reps - Thumb Opposition with Putty  - 2-3 x daily - 5 reps - Finger Spread  - 2-3 x daily - 5 reps   PATIENT EDUCATION: Education details: OT role and POC Person educated: Patient and Spouse Education method: Explanation Education comprehension: verbalized understanding & returns demo   [place]: 01/25/23: In-hand manipulations as listed above  02/01/23: Access Code: [email or web address] URL:  [email or web address]   GOALS:  SHORT TERM GOALS: Target date: 02/13/2023    Patient will demonstrate LUE HEP with 25% verbal cues or less for proper execution. Baseline: Goal status: 02/13/23: MET  2.  Patient will independently recall at least 2 compensatory strategies for visual impairment without cueing. Baseline:  Goal status: 02/13/23: MET and vision no longer a concern  3.  Pt will independently recall at least 1 strategy for L inattention.  Baseline:  Goal status: 02/13/23: Not Met, but his wife has been present and cuing him   LONG TERM GOALS: Target date: 04/14/2023  Patient will demonstrate updated LUE HEP with 25% verbal cues or less for proper execution. Baseline:  Goal status: INITIAL  2.  Pt will complete FOTO at time of discharge.  Baseline:  Goal status: INITIAL  3.  Patient will complete nine-hole peg with use of L in 35 seconds or less. Baseline: 44.6 seconds Goal status: INITIAL  4.  Patient will demonstrate at least 55 lbs L grip strength as needed to open jars and other containers. Baseline: 41 lbs Goal status: INITIAL  5.  Pt will report improved writing legibility with use of L and AD as needed to at least 75% Baseline: 25% legibility Goal status: INITIAL   ASSESSMENT:  CLINICAL IMPRESSION: 02/13/23: They wish to start weaning occupational therapy but want to meet with the therapist on Monday to see if she has any new insight for IADL cooking/kitchen tasks and cognitive sequencing tasks.  Otherwise they feel like he is managing his handwriting very well now, and vision issues are no longer a problem.  He still does have some left neglect however.  PERFORMANCE DEFICITS: in functional skills including ADLs, IADLs, coordination, proprioception, ROM, strength, pain, Fine motor control, mobility, endurance, decreased knowledge of precautions, vision,  and UE functional use.    PLAN:  OT FREQUENCY: 2x/week  OT DURATION: 12 weeks  PLANNED INTERVENTIONS: self care/ADL training, therapeutic exercise, therapeutic activity, neuromuscular re-education, functional mobility training, electrical stimulation, ultrasound, moist heat, patient/family education, cognitive remediation/compensation, visual/perceptual remediation/compensation, DME and/or AE instructions, and Re-evaluation  CONSULTED AND AGREED WITH PLAN OF CARE: Patient and family member/caregiver  PLAN FOR NEXT SESSION:  Next Monday work with them to problem solve cognitive/sequencing tasks especially as they applied to kitchen/IADL tasks as this is 1 he and his spouse stated was somewhat of a problem at home.  If all goes well they may choose to cancel some appointments and start to wean occupational therapy.    Benito Mccreedy, OT 02/15/2023, 4:58 PM

## 2023-02-13 NOTE — Therapy (Signed)
OUTPATIENT SPEECH LANGUAGE PATHOLOGY TREATMENT NOTE   Patient Name: James Estrada MRN: [id number] DOB:[birth year], 82 y.o., male Today's Date: 02/13/2023  PCP: James Carol, MD REFERRING PROVIDER: [name], MD  END OF SESSION:   End of Session - 02/13/23 1359     Visit Number 12    Number of Visits 25    Date for SLP Re-Evaluation 03/20/23    SLP Start Time 2    SLP Stop Time  [id number]    SLP Time Calculation (min) 45 min    Activity Tolerance Patient tolerated treatment well              Past Medical History:  Diagnosis Date   AAA (abdominal aortic aneurysm) (James Estrada)    last u/s done 07/18/17    Aneurysm of left internal iliac artery (James Estrada) 05/22/2022   Arthritis    Bradycardia    CVA (cerebral vascular accident) (James Estrada) 10/04/2022   Dysrhythmia    frequent PAC, for 20 years   GERD (gastroesophageal reflux disease)    occ, OTC   Hemorrhoids    History of hiatal hernia    Hyperlipidemia    Hypertension    S/P aortic dissection repair 09/18/2022   Seasonal allergies    Type 1 dissection of thoracic aorta (James Estrada) 10/26/2022   Past Surgical History:  Procedure Laterality Date   ABDOMINAL AORTIC ENDOVASCULAR STENT GRAFT N/A 05/04/2020   Procedure: ABDOMINAL AORTIC ENDOVASCULAR STENT GRAFT;  Surgeon: James Posner, MD;  Location: [place];  Service: Vascular;  Laterality: N/A;   CATARACT EXTRACTION Bilateral 2009   DIAGNOSTIC LAPAROSCOPY     laparoscopic hernia repair   EYE SURGERY Bilateral    cataract removal   HERNIA REPAIR Bilateral 1999, 2006   JOINT REPLACEMENT Right ~2018   hip replacement   pheochromocytoma  1993   PROSTATECTOMY N/A 05/15/2013   Procedure: PROSTATECTOMY RETROPUBIC; SIMPLE OPEN PROSTATECTOMY;  Surgeon: James Amass, MD;  Location: [facility];  Service: Urology;  Laterality: N/A;   REPAIR OF ACUTE ASCENDING THORACIC AORTIC DISSECTION N/A 09/17/2022   Procedure: REPAIR OF ACUTE ASCENDING THORACIC AORTIC DISSECTION USING 28 MM HEMASHIELD PLATINUM  VASCULAR GRAFT;  Surgeon: James Nakayama, MD;  Location: [place];  Service: Vascular;  Laterality: N/A;  Median sternotomy   TOTAL ELBOW REPLACEMENT Left    > 30 years ago   TOTAL HIP ARTHROPLASTY Right 09/08/2017   Procedure: RIGHT TOTAL HIP ARTHROPLASTY ANTERIOR APPROACH;  Surgeon: James Rossetti, MD;  Location: [facility];  Service: Orthopedics;  Laterality: Right;   ULTRASOUND GUIDANCE FOR VASCULAR ACCESS Bilateral 05/04/2020   Procedure: ULTRASOUND GUIDANCE FOR VASCULAR ACCESS;  Surgeon: James Posner, MD;  Location: [facility] OR;  Service: Vascular;  Laterality: Bilateral;   Patient Active Problem List   Diagnosis Date Noted   Type 1 dissection of thoracic aorta 10/26/2022   CVA (cerebral vascular accident) 10/04/2022   Sepsis with acute renal failure without septic shock 09/30/2022   S/P aortic dissection repair 09/18/2022   Status post surgery 09/17/2022   Aneurysm of left internal iliac artery 05/22/2022   Colon cancer screening 05/22/2022   Essential hypertension 05/22/2022   Hemorrhoids 05/22/2022   Inguinal hernia 05/22/2022   Irritable bowel syndrome 05/22/2022   Mixed hyperlipidemia 05/22/2022   Pure hypercholesterolemia 05/22/2022   Thrombocytopenia 05/22/2022   Allergic rhinitis 05/22/2022   AAA (abdominal aortic aneurysm) without rupture 05/04/2020   Trochanteric bursitis, right hip 08/20/2018   History of right hip replacement 08/20/2018   Status  post total replacement of right hip 09/08/2017   Pain of right hip joint 08/07/2017   Unilateral primary osteoarthritis, right hip 08/07/2017   BPH (benign prostatic hypertrophy) with urinary obstruction 05/15/2013    ONSET DATE: 09/17/22  REFERRING DIAG: R13.10 (ICD-10-CM) - Dysphagia, unspecified  THERAPY DIAG:  Aphasia  Cognitive communication deficit  Rationale for Evaluation and Treatment: Rehabilitation  SUBJECTIVE:   SUBJECTIVE STATEMENT:"It's a little better. I'm talking on the phone, but I'm still  reluctant to do it." "We're making a little bit of progress." Re: communication  PAIN:  Are you having pain? No   OBJECTIVE:  Pt is a 82 y/o M admitted to [facility] on 09/17/22 with type 1 aortic dissection requiring emergent repair. Head CT 11/6 acute infarcts within R MCA involvement, including R cerebellum, basal ganglia, frontal lobe. ETT 11/4-11/9. PMH AAA s/p endovascular repair (2020), HTN, HLD, GERD.   TODAY'S TREATMENT:                                                                                                                                         DATE:   02/13/23: Pt reported difficulty recalling his home and email address over the phone (when making reservations) and relying on his wife to provide it. Also reported difficulty speaking over the phone with his friends.  Reported sometimes when he order food at a restaurant, he'll say the wrong word and he has become better about correcting himself.  Pt wanted to work on planning out his communication with his PCP later this week. SLP discussed importance of writing information down to support his memory and to plan his communication (e.g email address).  Targeted word-finding (spoken and written language) during structured divergent naming task (scattergories). Given occasional min to rare mod- A (semantic, gestural, phonemic cues), pt successfully generated complex, high level words for 10/10 categories. HEP: Practice speaking over phone and providing email address and spelling it.  02/01/23: Targeted reading comprehension and narrative discourse reading 6-8 sentence paragraphs ([employer]) and giving 2-3 sentence cohesive summary. James Estrada generated accurate summaries with occasional min questioning cues for clarification and cohesion. Verbal expression targeted with James Estrada stating his opinion on the articles, again with occasional min questioning cues to ID empty speech and for cohesion. Targeted word finding in complex naming with a  given letter in 12 categories with minimal extended time and rare min A. James Estrada summarized a movie plot accurately with mod I and min pausing for word finding.   01/30/23: Pt reported that he continues to experience moments of communicative difficulty in groups. Pt has been successful in summarizing brief reading passages, requested strategies on how to challenge himself. SLP discussed reading longer passages on more complex topics and then summarizing the material to a family member.  Targeted anomia, complex sentence generation utilizing VNeST (target verb: pour, monitor, defend, climb) to generate 3 subjects and objects for each  verb for a total of 12. Given rare min A, pt generated three subject-object pairs. Pt then answered when, where, why questions for each verb given rare min-A to generate 4 complex sentences.    PATIENT EDUCATION: Education details: See patient instructions, see today's treatment, compensations for aphasia Person educated: Patient and Spouse Education method: Explanation, Verbal cues, and Handouts Education comprehension: verbal cues required and needs further education  GOALS: Goals reviewed with patient? Yes   SHORT TERM GOALS: Target date: 01/23/23   Pt will name 10 items in personally relevant categories over 3 sessions with occasional min A Baseline: Goal status: MET   2.  Pt will write 4 item grocery list with occasional min A Baseline:  Goal status: MET   3.  Pt will respond to 3 texts with occasional min A over 1 week Baseline:  Goal status: MET   4.  Pt will generate complex verbal sentences 3x using Pharmacist, hospital with rare min A over 2 sessions Baseline:  Goal status: MET   5.  Pt will comprehend 4 sentence passage/email with extended time and rare min A Baseline:  Goal status: MET   6.  Pt will employ compensations for aphasia in structured language tasks with occasional min A Baseline:  Goal status: MET   LONG TERM  GOALS: Target date: 03/20/23   Pt will complete complex naming tasks with 80% accuracy and occasional min A Baseline:  Goal status: MET   2.  Pt will carryover verbal compensations for aphasia with occasional min A in conversation as needed over 2 sessions Baseline:  Goal status: MET   3.  Pt will carryover 3 strategies to successfully communicate in community with clerks, servers etc Baseline:  Goal status: MET   4.  Pt will write 3 sentence text or email with occasional min A, external aids and occasional min A  Baseline:  Goal status: ONGOING   5.  Pt will improve score on Communicative Participation Item Bank by 4 points Baseline: 14/30 Goal status: ONGOING    ASSESSMENT:   CLINICAL IMPRESSION: Patient is a 82 y.o. male who was seen today for aphasia s/p CVA. He is accompanied by his spouse [name]. Prior to CVA, [place] was active golfing, managing finances,  keeping up with the stock market and socializing with friends. Aphasia improved to mild aphasia. Continued improvement in word finding, reading comprehension and written expression. He reports making phone calls to make reservations and having difficulty recalling his address and relying on his wife for help. Ongoing training in compensations for aphasia and targeting written and verbal expression as well as reading comprehension. Short term goals met and making excellent progress toward long term goals. I continue to recommend skilled ST to maximize verbal and written communication and cognition for safety, to reduce caregiver burden and QOL.    OBJECTIVE IMPAIRMENTS: include memory and aphasia. These impairments are limiting patient from managing medications, managing appointments, managing finances, household responsibilities, ADLs/IADLs, and effectively communicating at home and in community. Factors affecting potential to achieve goals and functional outcome are ability to learn/carryover information. Patient will benefit from  skilled SLP services to address above impairments and improve overall function.   REHAB POTENTIAL: Good   PLAN:   SLP FREQUENCY: 2x/week   SLP DURATION: 12 weeks   PLANNED INTERVENTIONS: Language facilitation, Environmental controls, Cueing hierachy, Cognitive reorganization, Internal/external aids, Functional tasks, and Multimodal communication approach     Leroy Libman, Student-SLP 02/13/2023, 2:02 PM

## 2023-02-13 NOTE — Patient Instructions (Addendum)
Practice (email) address over phone and spelling it out   When you make a phone call, make sure you're well-rested, in a quiet place, and you have written down topics ahead of time

## 2023-02-13 NOTE — Therapy (Signed)
OUTPATIENT PHYSICAL THERAPY NEURO TREATMENT   Patient Name: James Estrada MRN: [id number] DOB:[birth year], 82 y.o., male Today's Date: 02/13/2023   PCP: [name], MD REFERRING PROVIDER: Jolaine Artist, MD  END OF SESSION:  PT End of Session - 02/13/23 1319     Visit Number 9    Number of Visits 17    Date for PT Re-Evaluation 03/05/23    Authorization Type BCBS Medicare (needs 10th visit PN)    Progress Note Due on Visit 10    PT Start Time 1318    PT Stop Time 1401    PT Time Calculation (min) 43 min    Equipment Utilized During Treatment Gait belt    Activity Tolerance Patient tolerated treatment well    Behavior During Therapy WFL for tasks assessed/performed                Past Medical History:  Diagnosis Date   AAA (abdominal aortic aneurysm) ([place])    last u/s done 07/18/17    Aneurysm of left internal iliac artery ([place]) 05/22/2022   Arthritis    Bradycardia    CVA (cerebral vascular accident) ([place]) 10/04/2022   Dysrhythmia    frequent PAC, for 20 years   GERD (gastroesophageal reflux disease)    occ, OTC   Hemorrhoids    History of hiatal hernia    Hyperlipidemia    Hypertension    S/P aortic dissection repair 09/18/2022   Seasonal allergies    Type 1 dissection of thoracic aorta ([place]) 10/26/2022   Past Surgical History:  Procedure Laterality Date   ABDOMINAL AORTIC ENDOVASCULAR STENT GRAFT N/A 05/04/2020   Procedure: ABDOMINAL AORTIC ENDOVASCULAR STENT GRAFT;  Surgeon: [name], MD;  Location: [place];  Service: Vascular;  Laterality: N/A;   CATARACT EXTRACTION Bilateral 2009   DIAGNOSTIC LAPAROSCOPY     laparoscopic hernia repair   EYE SURGERY Bilateral    cataract removal   HERNIA REPAIR Bilateral 1999, 2006   JOINT REPLACEMENT Right ~2018   hip replacement   pheochromocytoma  1993   PROSTATECTOMY N/A 05/15/2013   Procedure: PROSTATECTOMY RETROPUBIC; SIMPLE OPEN PROSTATECTOMY;  Surgeon: [name], MD;  Location: [facility];   Service: Urology;  Laterality: N/A;   REPAIR OF ACUTE ASCENDING THORACIC AORTIC DISSECTION N/A 09/17/2022   Procedure: REPAIR OF ACUTE ASCENDING THORACIC AORTIC DISSECTION USING 28 MM HEMASHIELD PLATINUM VASCULAR GRAFT;  Surgeon: [name], MD;  Location: [place];  Service: Vascular;  Laterality: N/A;  Median sternotomy   TOTAL ELBOW REPLACEMENT Left    > 30 years ago   TOTAL HIP ARTHROPLASTY Right 09/08/2017   Procedure: RIGHT TOTAL HIP ARTHROPLASTY ANTERIOR APPROACH;  Surgeon: [name], MD;  Location: [facility];  Service: Orthopedics;  Laterality: Right;   ULTRASOUND GUIDANCE FOR VASCULAR ACCESS Bilateral 05/04/2020   Procedure: ULTRASOUND GUIDANCE FOR VASCULAR ACCESS;  Surgeon: [name], MD;  Location: [facility];  Service: Vascular;  Laterality: Bilateral;   Patient Active Problem List   Diagnosis Date Noted   Type 1 dissection of thoracic aorta 10/26/2022   CVA (cerebral vascular accident) 10/04/2022   Sepsis with acute renal failure without septic shock 09/30/2022   S/P aortic dissection repair 09/18/2022   Status post surgery 09/17/2022   Aneurysm of left internal iliac artery 05/22/2022   Colon cancer screening 05/22/2022   Essential hypertension 05/22/2022   Hemorrhoids 05/22/2022   Inguinal hernia 05/22/2022   Irritable bowel syndrome 05/22/2022   Mixed hyperlipidemia 05/22/2022  Pure hypercholesterolemia 05/22/2022   Thrombocytopenia 05/22/2022   Allergic rhinitis 05/22/2022   AAA (abdominal aortic aneurysm) without rupture 05/04/2020   Trochanteric bursitis, right hip 08/20/2018   History of right hip replacement 08/20/2018   Status post total replacement of right hip 09/08/2017   Pain of right hip joint 08/07/2017   Unilateral primary osteoarthritis, right hip 08/07/2017   BPH (benign prostatic hypertrophy) with urinary obstruction 05/15/2013    ONSET DATE: 12/28/2022 (CVA on 09/17/22)  REFERRING DIAG: I63.19 (ICD-10-CM) - Cerebrovascular  accident (CVA) due to embolism of other precerebral artery ([place] Estr[place])  THERAPY DIAG:  Unsteadiness on feet  Other abnormalities of gait and mobility  Other lack of coordination  Rationale for Evaluation and Treatment: Rehabilitation  SUBJECTIVE:                                                                                                                                                                                             SUBJECTIVE STATEMENT: Pt ambulated into clinic without using cane. Wife reports pt has infection, will be getting antibiotics today. Has been pretty lethargic this weekend. No falls.    Pt accompanied by:  Wife, James Estrada   PERTINENT HISTORY:  Pt is a 82 y/o M admitted to [facility] on 09/17/22 with type 1 aortic dissection requiring emergent repair. Head CT 11/6 acute infarcts within R MCA involvement, including R cerebellum, basal ganglia, frontal lobe. ETT 11/4-11/9. PMH AAA s/p endovascular repair (2020), HTN, HLD, GERD. Hospitalized 09/18/23 to 10/21/23.Had [place], PT, SLP.    PAIN:  Are you having pain? No  VITALS Vitals:   02/13/23 1322 02/13/23 1325  BP: (!) 146/92 (!) 140/80  Pulse: 60 62  SpO2: 99%       PRECAUTIONS: Fall  OBJECTIVE:  TODAY'S TREATMENT:    Ther Act  Assessed vitals (see above) and pt's diastolic BP initially elevated but did reduce w/seated rest. Pt asymptomatic today but encouraged pt and wife to closely monitor BP at home. Pt verbalized understanding.   NMR  In // bars for improved dual-tasking, LE coordination and single leg stability: 6 Blaze pods on random reach setting using 2 colors for dual- tasking challenge (green - name animal, blue- name food). Pods arranged in straight line to work on visual scanning to L side: Round 1: 12 hits w/BUE support on rail. Pt unable to name food or animal despite max cues.  Round 2: 11 hits w/BUE support even though cued pt to not use UE support. Pt only naming food despite color and required mod  verbal cues to think of a food.  Round 3: 12 hits w/pt only  naming foods, mod cues for clues to name foods. Pt unaware that he did not name animals despite being able to teach back proper color categorization prior to starting round  6 Blaze pods on distracting colors setting for improved visual scanning and turns. Challenged pt to name the color of pod that he did tap: Round 1: 2 minutes, 18 hits w/intermittent UE support. Pods arranged in straight line and pt had no difficulty locating pods to L side, but did require min cues to remind pt to name color  Round 2: 2.5 minutes, 16 hits while standing on airex and pods arranged in circle around pt to work on turns. CGA throughout for safety. Pt unable to name colors but did well scanning for proper pod to tap.    PATIENT EDUCATION: Education details:  Continue HEP, encouraged pt and wife to play "scanning games" or word-finding activities while in public places to work on dual-tasking, importance of monitoring BP at home  Person educated: Patient and Spouse Education method: Explanation and Verbal cues Education comprehension: verbalized understanding and needs further education  HOME EXERCISE PROGRAM: Access Code: [email or web address] URL: [email or web address] Date: 01/16/2023 Prepared by: Elease Etienne  Exercises - Standing Hamstring Curl with Resistance  - 1 x daily - 5 x weekly - 2 sets - 12 reps - Corner Balance Feet Together With Eyes Closed  - 1 x daily - 5 x weekly - 1 sets - 2 reps - 30-45 seconds hold - Standing Tandem Balance with Counter Support  - 1 x daily - 5 x weekly - 1 sets - 2 reps - 30-45 seconds hold - Heel Raises with Counter Support  - 1 x daily - 5 x weekly - 1-2 sets - 12 reps  GOALS: Goals reviewed with patient? Yes  SHORT TERM GOALS: Target date: 02/02/23  Pt will be IND with initial HEP in order to indicate improved functional mobility and dec fall risk. Baseline: Pt not performing regularly  Goal  status: IN PROGRESS  2.  Pt will ambulate 150' without AD mod I level in order to indicate more independent household ambulation.   Baseline:  Goal status: MET  3.  Pt will improve FGA to >/=23/30 in order to indicate dec fall risk.  Baseline: 19/30; 22/30 (3/20) Goal status: NOT MET  4.  Pt will maintain gait speed to >/=3.0 ft/sec w/o AD while scanning environment without overt LOB in order to indicate dec fall risk.  Baseline: 3.11 ft/s on 3/20  Goal status: MET  5.  Will assess MCTSIB and update goals to reflect progress.  Baseline: Pt scored 120/120 at baseline  Goal status: MET  6.  Pt will negotiate up/down 4 steps with single rail in alt pattern, up/down ramp and curb and ambulate x 500' outdoors over unlevel paved surfaces at S level without AD in order to indicate improved community mobility.   Baseline:  Goal status: MET  LONG TERM GOALS: Target date: 03/05/23  Pt will be IND with final HEP in order to indicate improved functional mobility and dec fall risk. Baseline:  Goal status: INITIAL  2.  Pt will improve FGA to >/=26/30 in order to indicate dec fall risk.   Baseline: 19/30 Goal status: INITIAL  3.  Will add LTG for MTCSIB once assessed.  Baseline: pt scored 120/120 at baseline Goal status: MET  4.  Pt will negotiate up/down 8 steps with single rail, up/down ramp and curb and ambulate x 1000' outdoors over unlevel paved surfaces without  AD at mod I level while scanning environment in order to indicate improved community mobility.   Baseline:  Goal status: INITIAL    ASSESSMENT:  CLINICAL IMPRESSION: Emphasis of skilled PT session on dual-tasking, LE coordination and visual scanning. Pt unable to maintain simple dual-task despite being provided w/mod-max verbal cues. Pt was able to visualize pods to L side well today but very challenged w/stability when challenged with dual-task. Continue POC.   OBJECTIVE IMPAIRMENTS: Abnormal gait, cardiopulmonary  status limiting activity, decreased activity tolerance, decreased balance, decreased endurance, decreased mobility, decreased strength, and impaired perceived functional ability.   ACTIVITY LIMITATIONS: carrying, lifting, standing, squatting, stairs, bathing, and locomotion level  PARTICIPATION LIMITATIONS: meal prep, medication management, personal finances, driving, shopping, community activity, and yard work  PERSONAL FACTORS: Age and 3+ comorbidities: see above  are also affecting patient's functional outcome.   REHAB POTENTIAL: Good  CLINICAL DECISION MAKING: Evolving/moderate complexity  EVALUATION COMPLEXITY: Moderate  PLAN:  PT FREQUENCY: 2x/week  PT DURATION: 8 weeks  PLANNED INTERVENTIONS: Therapeutic exercises, Therapeutic activity, Neuromuscular re-education, Balance training, Gait training, Patient/Family education, Self Care, and Joint mobilization  PLAN FOR NEXT SESSION:  Review HEP. Add to HEP prn, outdoor gait-dual task, obstacle course, rebounder drills, blaze pods in // bars-add obstacle course and UE/LE distinction tasks w/ distracting colors, tilt board ball toss w/ cognitive dual task  Charlett Nose, PT, [place] [address] [place], [place]  [address] Phone:  [phone] Fax:  [phone] 02/13/23, 2:02 PM

## 2023-02-14 DIAGNOSIS — L820 Inflamed seborrheic keratosis: Secondary | ICD-10-CM | POA: Diagnosis not present

## 2023-02-15 ENCOUNTER — Ambulatory Visit: Payer: Medicare Other | Admitting: Physical Therapy

## 2023-02-15 ENCOUNTER — Encounter: Payer: Self-pay | Admitting: Speech Pathology

## 2023-02-15 ENCOUNTER — Encounter: Payer: Self-pay | Admitting: Rehabilitative and Restorative Service Providers"

## 2023-02-15 ENCOUNTER — Ambulatory Visit: Payer: Medicare Other | Admitting: Speech Pathology

## 2023-02-15 ENCOUNTER — Ambulatory Visit: Payer: Medicare Other | Admitting: Rehabilitative and Restorative Service Providers"

## 2023-02-15 VITALS — BP 136/71 | HR 74

## 2023-02-15 DIAGNOSIS — I6319 Cerebral infarction due to embolism of other precerebral artery: Secondary | ICD-10-CM | POA: Diagnosis not present

## 2023-02-15 DIAGNOSIS — R4701 Aphasia: Secondary | ICD-10-CM

## 2023-02-15 DIAGNOSIS — R2681 Unsteadiness on feet: Secondary | ICD-10-CM | POA: Diagnosis not present

## 2023-02-15 DIAGNOSIS — R278 Other lack of coordination: Secondary | ICD-10-CM

## 2023-02-15 DIAGNOSIS — M6281 Muscle weakness (generalized): Secondary | ICD-10-CM | POA: Diagnosis not present

## 2023-02-15 DIAGNOSIS — R2689 Other abnormalities of gait and mobility: Secondary | ICD-10-CM

## 2023-02-15 DIAGNOSIS — R41842 Visuospatial deficit: Secondary | ICD-10-CM | POA: Diagnosis not present

## 2023-02-15 DIAGNOSIS — R29818 Other symptoms and signs involving the nervous system: Secondary | ICD-10-CM | POA: Diagnosis not present

## 2023-02-15 DIAGNOSIS — I69352 Hemiplegia and hemiparesis following cerebral infarction affecting left dominant side: Secondary | ICD-10-CM

## 2023-02-15 DIAGNOSIS — R41841 Cognitive communication deficit: Secondary | ICD-10-CM | POA: Diagnosis not present

## 2023-02-15 NOTE — Therapy (Signed)
OUTPATIENT SPEECH LANGUAGE PATHOLOGY TREATMENT NOTE   Patient Name: James Estrada MRN: [id number] DOB:05-Oct-1941, 82 y.o., male Today's Date: 02/20/2023  PCP: Renford Dills, MD REFERRING PROVIDER: Renford Dills, MD  END OF SESSION:      Past Medical History:  Diagnosis Date   AAA (abdominal aortic aneurysm)    last u/s done 07/18/17    Aneurysm of left internal iliac artery 05/22/2022   Arthritis    Bradycardia    CVA (cerebral vascular accident) 10/04/2022   Dysrhythmia    frequent PAC, for 20 years   GERD (gastroesophageal reflux disease)    occ, OTC   Hemorrhoids    History of hiatal hernia    Hyperlipidemia    Hypertension    S/P aortic dissection repair 09/18/2022   Seasonal allergies    Type 1 dissection of thoracic aorta 10/26/2022   Past Surgical History:  Procedure Laterality Date   ABDOMINAL AORTIC ENDOVASCULAR STENT GRAFT N/A 05/04/2020   Procedure: ABDOMINAL AORTIC ENDOVASCULAR STENT GRAFT;  Surgeon: Larina Earthly, MD;  Location: [facility];  Service: Vascular;  Laterality: N/A;   CATARACT EXTRACTION Bilateral 2009   DIAGNOSTIC LAPAROSCOPY     laparoscopic hernia repair   EYE SURGERY Bilateral    cataract removal   HERNIA REPAIR Bilateral 1999, 2006   JOINT REPLACEMENT Right ~2018   hip replacement   pheochromocytoma  1993   PROSTATECTOMY N/A 05/15/2013   Procedure: PROSTATECTOMY RETROPUBIC; SIMPLE OPEN PROSTATECTOMY;  Surgeon: Valetta Fuller, MD;  Location: [facility];  Service: Urology;  Laterality: N/A;   REPAIR OF ACUTE ASCENDING THORACIC AORTIC DISSECTION N/A 09/17/2022   Procedure: REPAIR OF ACUTE ASCENDING THORACIC AORTIC DISSECTION USING 28 MM HEMASHIELD PLATINUM VASCULAR GRAFT;  Surgeon: Loreli Slot, MD;  Location: [facility];  Service: Vascular;  Laterality: N/A;  Median sternotomy   TOTAL ELBOW REPLACEMENT Left    > 30 years ago   TOTAL HIP ARTHROPLASTY Right 09/08/2017   Procedure: RIGHT TOTAL HIP ARTHROPLASTY ANTERIOR APPROACH;  Surgeon:  Kathryne Hitch, MD;  Location: [facility];  Service: Orthopedics;  Laterality: Right;   ULTRASOUND GUIDANCE FOR VASCULAR ACCESS Bilateral 05/04/2020   Procedure: ULTRASOUND GUIDANCE FOR VASCULAR ACCESS;  Surgeon: Larina Earthly, MD;  Location: [facility];  Service: Vascular;  Laterality: Bilateral;   Patient Active Problem List   Diagnosis Date Noted   Type 1 dissection of thoracic aorta 10/26/2022   CVA (cerebral vascular accident) 10/04/2022   Sepsis with acute renal failure without septic shock 09/30/2022   S/P aortic dissection repair 09/18/2022   Status post surgery 09/17/2022   Aneurysm of left internal iliac artery 05/22/2022   Colon cancer screening 05/22/2022   Essential hypertension 05/22/2022   Hemorrhoids 05/22/2022   Inguinal hernia 05/22/2022   Irritable bowel syndrome 05/22/2022   Mixed hyperlipidemia 05/22/2022   Pure hypercholesterolemia 05/22/2022   Thrombocytopenia 05/22/2022   Allergic rhinitis 05/22/2022   AAA (abdominal aortic aneurysm) without rupture 05/04/2020   Trochanteric bursitis, right hip 08/20/2018   History of right hip replacement 08/20/2018   Status post total replacement of right hip 09/08/2017   Pain of right hip joint 08/07/2017   Unilateral primary osteoarthritis, right hip 08/07/2017   BPH (benign prostatic hypertrophy) with urinary obstruction 05/15/2013    ONSET DATE: 09/17/22  REFERRING DIAG: R13.10 (ICD-10-CM) - Dysphagia, unspecified  THERAPY DIAG:  Aphasia  Rationale for Evaluation and Treatment: Rehabilitation  SUBJECTIVE:   SUBJECTIVE STATEMENT:"It's a little better. I'm talking on the phone, but I'm  still reluctant to do it." "We're making a little bit of progress." Re: communication  PAIN:  Are you having pain? No   OBJECTIVE:  Pt is a 82 y/o M [facility] with type 1 aortic dissection requiring emergent repair. Head CT 11/6 acute infarcts within R MCA involvement, including R cerebellum, basal ganglia,  frontal lobe. ETT 11/4-11/9. PMH AAA s/p endovascular repair (2020), HTN, HLD, GERD.   TODAY'S TREATMENT:                                                                                                                                         DATE:   02/15/23: Targeted word finding, compensations for aphasia and cohesive discourse at conversation level. James Estrada required usual min to mod verbal and questioning cues to use verbal compensations for anomic episodes. He relayed 2 family stories with occasional min questioning cues for sequence and to provide salient details. He verbalized 3 compensatory strategies for aphasia when making phone calls and providing his address and email address over the phone. (Write it down, practice script, self advocate) Written expression at phrase level required usual mod A to ID and correct errors 12/15x  02/13/23: Pt reported difficulty recalling his home and email address over the phone (when making reservations) and relying on his wife to provide it. Also reported difficulty speaking over the phone with his friends.  Reported sometimes when he order food at a restaurant, he'll say the wrong word and he has become better about correcting himself.  Pt wanted to work on planning out his communication with his PCP later this week. SLP discussed importance of writing information down to support his memory and to plan his communication (e.g email address).  Targeted word-finding (spoken and written language) during structured divergent naming task (scattergories). Given occasional min to rare mod- A (semantic, gestural, phonemic cues), pt successfully generated complex, high level words for 10/10 categories. HEP: Practice speaking over phone and providing email address and spelling it.  02/01/23: Targeted reading comprehension and narrative discourse reading 6-8 sentence paragraphs ([employer]) and giving 2-3 sentence cohesive summary. James Estrada generated accurate summaries with  occasional min questioning cues for clarification and cohesion. Verbal expression targeted with James Estrada stating his opinion on the articles, again with occasional min questioning cues to ID empty speech and for cohesion. Targeted word finding in complex naming with a given letter in 12 categories with minimal extended time and rare min A. James Estrada summarized a movie plot accurately with mod I and min pausing for word finding.   01/30/23: Pt reported that he continues to experience moments of communicative difficulty in groups. Pt has been successful in summarizing brief reading passages, requested strategies on how to challenge himself. SLP discussed reading longer passages on more complex topics and then summarizing the material to a family member.  Targeted anomia, complex sentence generation utilizing VNeST (target verb:  pour, monitor, defend, climb) to generate 3 subjects and objects for each verb for a total of 12. Given rare min A, pt generated three subject-object pairs. Pt then answered when, where, why questions for each verb given rare min-A to generate 4 complex sentences.    PATIENT EDUCATION: Education details: See patient instructions, see today's treatment, compensations for aphasia Person educated: Patient and Spouse Education method: Explanation, Verbal cues, and Handouts Education comprehension: verbal cues required and needs further education  GOALS: Goals reviewed with patient? Yes   SHORT TERM GOALS: Target date: 01/23/23   Pt will name 10 items in personally relevant categories over 3 sessions with occasional min A Baseline: Goal status: MET   2.  Pt will write 4 item grocery list with occasional min A Baseline:  Goal status: MET   3.  Pt will respond to 3 texts with occasional min A over 1 week Baseline:  Goal status: MET   4.  Pt will generate complex verbal sentences 3x using Scientist, product/process developmentVerb Network Strengthening Training with rare min A over 2 sessions Baseline:  Goal status:  MET   5.  Pt will comprehend 4 sentence passage/email with extended time and rare min A Baseline:  Goal status: MET   6.  Pt will employ compensations for aphasia in structured language tasks with occasional min A Baseline:  Goal status: MET   LONG TERM GOALS: Target date: 03/20/23   Pt will complete complex naming tasks with 80% accuracy and occasional min A Baseline:  Goal status: MET   2.  Pt will carryover verbal compensations for aphasia with occasional min A in conversation as needed over 2 sessions Baseline:  Goal status: MET   3.  Pt will carryover 3 strategies to successfully communicate in community with clerks, servers etc Baseline:  Goal status: MET   4.  Pt will write 3 sentence text or email with occasional min A, external aids and occasional min A  Baseline:  Goal status: ONGOING   5.  Pt will improve score on Communicative Participation Item Bank by 4 points Baseline: 14/30 Goal status: ONGOING    ASSESSMENT:   CLINICAL IMPRESSION: Patient is a 82 y.o. male who was seen today for aphasia s/p CVA. He is accompanied by his spouse Kathie RhodesBetty. Prior to CVA, James Estrada was active golfing, managing finances,  keeping up with the stock market and socializing with friends. Aphasia improved to mild aphasia. Continued improvement in word finding, reading comprehension and written expression. He reports making phone calls to make reservations and having difficulty recalling his address and relying on his wife for help. Ongoing training in compensations for aphasia and targeting written and verbal expression as well as reading comprehension. Short term goals met and making excellent progress toward long term goals. I continue to recommend skilled ST to maximize verbal and written communication and cognition for safety, to reduce caregiver burden and QOL.    OBJECTIVE IMPAIRMENTS: include memory and aphasia. These impairments are limiting patient from managing medications, managing  appointments, managing finances, household responsibilities, ADLs/IADLs, and effectively communicating at home and in community. Factors affecting potential to achieve goals and functional outcome are ability to learn/carryover information. Patient will benefit from skilled SLP services to address above impairments and improve overall function.   REHAB POTENTIAL: Good   PLAN:   SLP FREQUENCY: 2x/week   SLP DURATION: 12 weeks   PLANNED INTERVENTIONS: Language facilitation, Environmental controls, Cueing hierachy, Cognitive reorganization, Internal/external aids, Functional tasks, and Multimodal communication approach  Nisaiah Bechtol, Radene Journey, CCC-SLP 02/20/2023, 8:43 AM

## 2023-02-15 NOTE — Therapy (Signed)
OUTPATIENT PHYSICAL THERAPY NEURO TREATMENT- 10TH VISIT PROGRESS NOTE   Patient Name: WILLET TANGONAN MRN: [id number] DOB:11/26/1940, 82 y.o., male Today's Date: 02/15/2023   PCP: [name], MD REFERRING PROVIDER: Jolaine Artist, MD  Physical Therapy Progress Note   Dates of Reporting Period:01/04/23 - 02/15/23  See Note below for Objective Data and Assessment of Progress/Goals.    END OF SESSION:  PT End of Session - 02/15/23 1326     Visit Number 10    Number of Visits 17    Date for PT Re-Evaluation 03/05/23    Authorization Type BCBS Medicare (needs 10th visit PN)    Progress Note Due on Visit 10    PT Start Time 14   Pt arrived late   PT Stop Time 1357    PT Time Calculation (min) 32 min    Equipment Utilized During Treatment Gait belt    Activity Tolerance Patient tolerated treatment well    Behavior During Therapy WFL for tasks assessed/performed                Past Medical History:  Diagnosis Date   AAA (abdominal aortic aneurysm) ([place])    last u/s done 07/18/17    Aneurysm of left internal iliac artery ([place]) 05/22/2022   Arthritis    Bradycardia    CVA (cerebral vascular accident) ([place]) 10/04/2022   Dysrhythmia    frequent PAC, for 20 years   GERD (gastroesophageal reflux disease)    occ, OTC   Hemorrhoids    History of hiatal hernia    Hyperlipidemia    Hypertension    S/P aortic dissection repair 09/18/2022   Seasonal allergies    Type 1 dissection of thoracic aorta ([place]) 10/26/2022   Past Surgical History:  Procedure Laterality Date   ABDOMINAL AORTIC ENDOVASCULAR STENT GRAFT N/A 05/04/2020   Procedure: ABDOMINAL AORTIC ENDOVASCULAR STENT GRAFT;  Surgeon: [name], MD;  Location: [facility];  Service: Vascular;  Laterality: N/A;   CATARACT EXTRACTION Bilateral 2009   DIAGNOSTIC LAPAROSCOPY     laparoscopic hernia repair   EYE SURGERY Bilateral    cataract removal   HERNIA REPAIR Bilateral 1999, 2006   JOINT REPLACEMENT Right  ~2018   hip replacement   pheochromocytoma  1993   PROSTATECTOMY N/A 05/15/2013   Procedure: PROSTATECTOMY RETROPUBIC; SIMPLE OPEN PROSTATECTOMY;  Surgeon: [name], MD;  [facility];  Service: Urology;  Laterality: N/A;   REPAIR OF ACUTE ASCENDING THORACIC AORTIC DISSECTION N/A 09/17/2022   Procedure: REPAIR OF ACUTE ASCENDING THORACIC AORTIC DISSECTION USING 28 MM HEMASHIELD PLATINUM VASCULAR GRAFT;  Surgeon: [name], MD;  Location: [facility];  Service: Vascular;  Laterality: N/A;  Median sternotomy   TOTAL ELBOW REPLACEMENT Left    > 30 years ago   TOTAL HIP ARTHROPLASTY Right 09/08/2017   Procedure: RIGHT TOTAL HIP ARTHROPLASTY ANTERIOR APPROACH;  Surgeon: [name], MD;  [facility];  Service: Orthopedics;  Laterality: Right;   ULTRASOUND GUIDANCE FOR VASCULAR ACCESS Bilateral 05/04/2020   Procedure: ULTRASOUND GUIDANCE FOR VASCULAR ACCESS;  Surgeon: [name], MD;  Location: [facility] OR;  Service: Vascular;  Laterality: Bilateral;   Patient Active Problem List   Diagnosis Date Noted   Type 1 dissection of thoracic aorta 10/26/2022   CVA (cerebral vascular accident) 10/04/2022   Sepsis with acute renal failure without septic shock 09/30/2022   S/P aortic dissection repair 09/18/2022   Status post surgery 09/17/2022   Aneurysm of left internal  iliac artery 05/22/2022   Colon cancer screening 05/22/2022   Essential hypertension 05/22/2022   Hemorrhoids 05/22/2022   Inguinal hernia 05/22/2022   Irritable bowel syndrome 05/22/2022   Mixed hyperlipidemia 05/22/2022   Pure hypercholesterolemia 05/22/2022   Thrombocytopenia 05/22/2022   Allergic rhinitis 05/22/2022   AAA (abdominal aortic aneurysm) without rupture 05/04/2020   Trochanteric bursitis, right hip 08/20/2018   History of right hip replacement 08/20/2018   Status post total replacement of right hip 09/08/2017   Pain of right hip joint 08/07/2017   Unilateral primary osteoarthritis, right  hip 08/07/2017   BPH (benign prostatic hypertrophy) with urinary obstruction 05/15/2013    ONSET DATE: 12/28/2022 (CVA on 09/17/22)  REFERRING DIAG: I63.19 (ICD-10-CM) - Cerebrovascular accident (CVA) due to embolism of other precerebral artery ([place])  THERAPY DIAG:  Unsteadiness on feet  Other abnormalities of gait and mobility  Other lack of coordination  Rationale for Evaluation and Treatment: Rehabilitation  SUBJECTIVE:                                                                                                                                                                                             SUBJECTIVE STATEMENT: Pt denies acute changes. Feels good today.    Pt accompanied by:  Wife, [name]   PERTINENT HISTORY:  Pt is a 82 y/o M admitted to [facility] on 09/17/22 with type 1 aortic dissection requiring emergent repair. Head CT 11/6 acute infarcts within R MCA involvement, including R cerebellum, basal ganglia, frontal lobe. ETT 11/4-11/9. PMH AAA s/p endovascular repair (2020), HTN, HLD, GERD. Hospitalized 09/18/23 to 10/21/23.Had [facility], PT, SLP.    PAIN:  Are you having pain? No  VITALS Vitals:   02/15/23 1328  BP: 136/71  Pulse: 74       PRECAUTIONS: Fall  OBJECTIVE:  TODAY'S TREATMENT:    NMR  Walking w/self ball tosses in fwd and retro direction, 1x50' each, for improved motor dual-tasking. No instability noted, CGA throughout.  Walking w/ball catches/throws w/wife in fwd and retro direction for 50' x1 each direction for improved motor dual-tasking and L visual scanning. CGA for safety  Added naming alphabet w/fwd gait and words in alphabetical order w/retro gait for 50' each direction for cog-motor dual-task. Pt required mod cues from wife to perform and did require cues from therapist to maintain gait, as pt must stop to think of word. No instability noted  Alt fwd lunge w/rotation and 2kg ball throw/catch at rebounder, x18 each side, for improved lateral  weight shifting, dynamic balance and motor dual-tasking. Pt required mod-max multimodal cues and min A initially to perform and maintain  sequencing, but progressed to CGA only. Increased difficulty stepping w/RLE >LLE, but no LOB noted.    PATIENT EDUCATION: Education details:  Continue HEP Person educated: Patient and Spouse Education method: Explanation and Verbal cues Education comprehension: verbalized understanding and needs further education  HOME EXERCISE PROGRAM: Access Code: [email or web address] URL: [email or web address] Date: 01/16/2023 Prepared by: Elease Etienne  Exercises - Standing Hamstring Curl with Resistance  - 1 x daily - 5 x weekly - 2 sets - 12 reps - Corner Balance Feet Together With Eyes Closed  - 1 x daily - 5 x weekly - 1 sets - 2 reps - 30-45 seconds hold - Standing Tandem Balance with Counter Support  - 1 x daily - 5 x weekly - 1 sets - 2 reps - 30-45 seconds hold - Heel Raises with Counter Support  - 1 x daily - 5 x weekly - 1-2 sets - 12 reps  GOALS: Goals reviewed with patient? Yes  SHORT TERM GOALS: Target date: 02/02/23  Pt will be IND with initial HEP in order to indicate improved functional mobility and dec fall risk. Baseline: Pt not performing regularly  Goal status: IN PROGRESS  2.  Pt will ambulate 150' without AD mod I level in order to indicate more independent household ambulation.   Baseline:  Goal status: MET  3.  Pt will improve FGA to >/=23/30 in order to indicate dec fall risk.  Baseline: 19/30; 22/30 (3/20) Goal status: NOT MET  4.  Pt will maintain gait speed to >/=3.0 ft/sec w/o AD while scanning environment without overt LOB in order to indicate dec fall risk.  Baseline: 3.11 ft/s on 3/20  Goal status: MET  5.  Will assess MCTSIB and update goals to reflect progress.  Baseline: Pt scored 120/120 at baseline  Goal status: MET  6.  Pt will negotiate up/down 4 steps with single rail in alt pattern, up/down ramp and  curb and ambulate x 500' outdoors over unlevel paved surfaces at S level without AD in order to indicate improved community mobility.   Baseline:  Goal status: MET  LONG TERM GOALS: Target date: 03/05/23  Pt will be IND with final HEP in order to indicate improved functional mobility and dec fall risk. Baseline:  Goal status: INITIAL  2.  Pt will improve FGA to >/=26/30 in order to indicate dec fall risk.   Baseline: 19/30 Goal status: INITIAL  3.  Will add LTG for MTCSIB once assessed.  Baseline: pt scored 120/120 at baseline Goal status: MET  4.  Pt will negotiate up/down 8 steps with single rail, up/down ramp and curb and ambulate x 1000' outdoors over unlevel paved surfaces without AD at mod I level while scanning environment in order to indicate improved community mobility.   Baseline:  Goal status: INITIAL    ASSESSMENT:  CLINICAL IMPRESSION: Session limited as pt arrived late. Emphasis of skilled PT session on dual-tasking, lateral weight shifting and visual scanning to L side. Pt continues to have most difficulty w/cognitive-motor dual-tasks but did improve word-finding today w/cues from wife. Pt also requires min cues for attention to L side, but made no difference between rotation to R and L side today. Continue POC.   OBJECTIVE IMPAIRMENTS: Abnormal gait, cardiopulmonary status limiting activity, decreased activity tolerance, decreased balance, decreased endurance, decreased mobility, decreased strength, and impaired perceived functional ability.   ACTIVITY LIMITATIONS: carrying, lifting, standing, squatting, stairs, bathing, and locomotion level  PARTICIPATION LIMITATIONS: meal prep, medication management, personal finances, driving, shopping, community  activity, and yard work  PERSONAL FACTORS: Age and 3+ comorbidities: see above  are also affecting patient's functional outcome.   REHAB POTENTIAL: Good  CLINICAL DECISION MAKING: Evolving/moderate  complexity  EVALUATION COMPLEXITY: Moderate  PLAN:  PT FREQUENCY: 2x/week  PT DURATION: 8 weeks  PLANNED INTERVENTIONS: Therapeutic exercises, Therapeutic activity, Neuromuscular re-education, Balance training, Gait training, Patient/Family education, Self Care, and Joint mobilization  PLAN FOR NEXT SESSION:  Review HEP. Add to HEP prn, outdoor gait-dual task, obstacle course, rebounder drills, blaze pods in // bars-add obstacle course and UE/LE distinction tasks w/ distracting colors, tilt board ball toss w/ cognitive dual task  Charlett Nose, PT, [facility] [address] [place], [place]  [address] Phone:  [phone] Fax:  [phone] 02/15/23, 1:58 PM

## 2023-02-17 DIAGNOSIS — N401 Enlarged prostate with lower urinary tract symptoms: Secondary | ICD-10-CM | POA: Diagnosis not present

## 2023-02-17 DIAGNOSIS — R339 Retention of urine, unspecified: Secondary | ICD-10-CM | POA: Diagnosis not present

## 2023-02-20 ENCOUNTER — Ambulatory Visit: Payer: Medicare Other | Admitting: Physical Therapy

## 2023-02-20 ENCOUNTER — Ambulatory Visit: Payer: Medicare Other | Admitting: Occupational Therapy

## 2023-02-20 ENCOUNTER — Encounter: Payer: Self-pay | Admitting: Physical Therapy

## 2023-02-20 ENCOUNTER — Ambulatory Visit: Payer: Medicare Other | Admitting: Speech Pathology

## 2023-02-20 ENCOUNTER — Encounter: Payer: Self-pay | Admitting: Occupational Therapy

## 2023-02-20 DIAGNOSIS — R2681 Unsteadiness on feet: Secondary | ICD-10-CM

## 2023-02-20 DIAGNOSIS — R4701 Aphasia: Secondary | ICD-10-CM | POA: Diagnosis not present

## 2023-02-20 DIAGNOSIS — M6281 Muscle weakness (generalized): Secondary | ICD-10-CM

## 2023-02-20 DIAGNOSIS — R29818 Other symptoms and signs involving the nervous system: Secondary | ICD-10-CM

## 2023-02-20 DIAGNOSIS — R41841 Cognitive communication deficit: Secondary | ICD-10-CM | POA: Diagnosis not present

## 2023-02-20 DIAGNOSIS — I69352 Hemiplegia and hemiparesis following cerebral infarction affecting left dominant side: Secondary | ICD-10-CM | POA: Diagnosis not present

## 2023-02-20 DIAGNOSIS — R278 Other lack of coordination: Secondary | ICD-10-CM

## 2023-02-20 DIAGNOSIS — I6319 Cerebral infarction due to embolism of other precerebral artery: Secondary | ICD-10-CM | POA: Diagnosis not present

## 2023-02-20 DIAGNOSIS — R41842 Visuospatial deficit: Secondary | ICD-10-CM | POA: Diagnosis not present

## 2023-02-20 DIAGNOSIS — R2689 Other abnormalities of gait and mobility: Secondary | ICD-10-CM | POA: Diagnosis not present

## 2023-02-20 NOTE — Therapy (Signed)
OUTPATIENT OCCUPATIONAL THERAPY NEURO TREATMENT NOTE   Patient Name: Elane FritzGeorge E Kelter MRN: [id number] DOB:08-26-1941, 82 y.o., male Today's Date: 02/20/2023  PCP: Renford Dillsonald Polite, MD  REFERRING PROVIDER: Dolores PattyBensimhon, Daniel R, MD  END OF SESSION:  OT End of Session - 02/20/23 1231     Visit Number 5    Number of Visits 13    Date for OT Re-Evaluation 04/14/23    Authorization Type BCBS - no auth required    Progress Note Due on Visit 10    OT Start Time 1234    Activity Tolerance Patient tolerated treatment well;No increased pain;Patient limited by lethargy    Behavior [facility] for tasks assessed/performed             Past Medical History:  Diagnosis Date   AAA (abdominal aortic aneurysm)    last u/s done 07/18/17    Aneurysm of left internal iliac artery 05/22/2022   Arthritis    Bradycardia    CVA (cerebral vascular accident) 10/04/2022   Dysrhythmia    frequent PAC, for 20 years   GERD (gastroesophageal reflux disease)    occ, OTC   Hemorrhoids    History of hiatal hernia    Hyperlipidemia    Hypertension    S/P aortic dissection repair 09/18/2022   Seasonal allergies    Type 1 dissection of thoracic aorta 10/26/2022   Past Surgical History:  Procedure Laterality Date   ABDOMINAL AORTIC ENDOVASCULAR STENT GRAFT N/A 05/04/2020   Procedure: ABDOMINAL AORTIC ENDOVASCULAR STENT GRAFT;  Surgeon: Larina EarthlyEarly, Todd F, MD;  [facility];  Service: Vascular;  Laterality: N/A;   CATARACT EXTRACTION Bilateral 2009   DIAGNOSTIC LAPAROSCOPY     laparoscopic hernia repair   EYE SURGERY Bilateral    cataract removal   HERNIA REPAIR Bilateral 1999, 2006   JOINT REPLACEMENT Right ~2018   hip replacement   pheochromocytoma  1993   PROSTATECTOMY N/A 05/15/2013   Procedure: PROSTATECTOMY RETROPUBIC; SIMPLE OPEN PROSTATECTOMY;  Surgeon: Valetta Fulleravid S Grapey, MD;  [facility];  Service: Urology;  Laterality: N/A;   REPAIR OF ACUTE ASCENDING THORACIC AORTIC DISSECTION N/A  09/17/2022   Procedure: REPAIR OF ACUTE ASCENDING THORACIC AORTIC DISSECTION USING 28 MM HEMASHIELD PLATINUM VASCULAR GRAFT;  Surgeon: Loreli SlotHendrickson, Steven C, MD;  Location[facility];  Service: Vascular;  Laterality: N/A;  Median sternotomy   TOTAL ELBOW REPLACEMENT Left    > 30 years ago   TOTAL HIP ARTHROPLASTY Right 09/08/2017   Procedure: RIGHT TOTAL HIP ARTHROPLASTY ANTERIOR APPROACH;  Surgeon: Kathryne HitchBlackman, Christopher Y, MD;  [facility];  Service: Orthopedics;  Laterality: Right;   ULTRASOUND GUIDANCE FOR VASCULAR ACCESS Bilateral 05/04/2020   Procedure: ULTRASOUND GUIDANCE FOR VASCULAR ACCESS;  Surgeon: Larina EarthlyEarly, Todd F, MD;  [facility];  Service: Vascular;  Laterality: Bilateral;   Patient Active Problem List   Diagnosis Date Noted   Type 1 dissection of thoracic aorta 10/26/2022   CVA (cerebral vascular accident) 10/04/2022   Sepsis with acute renal failure without septic shock 09/30/2022   S/P aortic dissection repair 09/18/2022   Status post surgery 09/17/2022   Aneurysm of left internal iliac artery 05/22/2022   Colon cancer screening 05/22/2022   Essential hypertension 05/22/2022   Hemorrhoids 05/22/2022   Inguinal hernia 05/22/2022   Irritable bowel syndrome 05/22/2022   Mixed hyperlipidemia 05/22/2022   Pure hypercholesterolemia 05/22/2022   Thrombocytopenia 05/22/2022   Allergic rhinitis 05/22/2022   AAA (abdominal aortic aneurysm) without rupture 05/04/2020   Trochanteric bursitis,  right hip 08/20/2018   History of right hip replacement 08/20/2018   Status post total replacement of right hip 09/08/2017   Pain of right hip joint 08/07/2017   Unilateral primary osteoarthritis, right hip 08/07/2017   BPH (benign prostatic hypertrophy) with urinary obstruction 05/15/2013    ONSET DATE: 12/28/2022 (CVA on 09/17/22)   REFERRING DIAG: I63.19 (ICD-10-CM) - Cerebrovascular accident (CVA) due to embolism of other precerebral artery  THERAPY DIAG:  Other lack of  coordination  Muscle weakness (generalized)  Hemiplegia and hemiparesis following cerebral infarction affecting left dominant side  Unsteadiness on feet  Other symptoms and signs involving the nervous system  Visuospatial deficit  Cerebrovascular accident (CVA) due to embolism of other precerebral artery  Rationale for Evaluation and Treatment: Rehabilitation  PERTINENT HISTORY:  "Pt is a 82 y/o M [facility] 09[facility]22 with type 1 aortic dissection requiring emergent repair. Head CT 11/6 acute infarcts within R MCA involvement, including R cerebellum, basal ganglia, frontal lobe. ETT 11/4-11/9. PMH AAA s/p endovascular repair (2020), HTN, HLD, GERD. Hosoitalized 09/18/23 to 10/21/23.Had [facility], PT, SLP."   Has had limited L supination since elbow fracture prior to CVA. He has been using block letters for writing but is highly motivated to work on his handwriting in therapy.    PRECAUTIONS: Fall;  WEIGHT BEARING RESTRICTIONS: No   SUBJECTIVE:   SUBJECTIVE STATEMENT: He states he does think he is making some progress but needs to focus on sequencing more.He has been following prompts given to him by ST but has been unable to call and refill his prescriptions successfully.   Pt accompanied by: self  PAIN:  Are you having pain? No   FALLS: Has patient fallen in last 6 months? Yes. Number of falls 1  LIVING ENVIRONMENT: Lives with: lives with their family Lives in: House/apartment (lives at daughters house-will be moving to [place] in April/May to single home).  Stairs: Yes: Internal: flight but doesn't use steps; on left going up and External: 2 steps; none Has following equipment at home: Single point cane, shower chair, Grab bars, and tub/shower    PLOF: Independent, retired Paediatric nurse (lots of computer use), golf, went to the gym on a daily basis, was an avid gardener; driving   PATIENT GOALS: return to PLOF    OBJECTIVE:  (All objective assessments below  are from initial evaluation on: 01/16/23 unless otherwise specified.)   HAND DOMINANCE: Left  ADLs: Overall ADLs: mostly mod I with assistance in cutting up meat  IADLs: Shopping: max A Meal Prep: liked to cook and bake - has been able to make some meals with min A Community mobility: dependent Medication management: dependent Financial management: dependent Handwriting: 25% legible  MOBILITY STATUS: Needs Assist: SPC for community mobility  ACTIVITY TOLERANCE: Activity tolerance: Fair  FUNCTIONAL OUTCOME MEASURES: FOTO: to be completed at a later date  UPPER EXTREMITY ROM:     AROM Right (eval) Left (eval)  Shoulder flexion WNL WNL  Shoulder abduction WNL WNL  Elbow flexion WNL WNL  Elbow extension WNL WNL  Wrist flexion WNL WNL  Wrist extension WNL WNL  Wrist pronation WNL WNL  Wrist supination WNL Impaired (chronic)   Digit Composite Flexion WNL WNL  Digit Composite Extension WNL WNL  Digit Opposition WNL WNL  (Blank rows = not tested)  UPPER EXTREMITY MMT:     MMT Right (eval) Left (eval)  Shoulder flexion WNL [facility]  Shoulder abduction WNL WFL  Elbow flexion WNL [facility]  Elbow extension WNL WFL  (Blank rows = not tested)  HAND FUNCTION: 02/15/23: Lt Grip Strength: 49#  Grip strength: Right: 72.9 lbs; Left: 41 lbs  COORDINATION: 02/15/23: 9HPT: Lt 33 sec  Finger Nose Finger test: impaired on L 9 Hole Peg test: Right: 33.5 sec; Left: 44.6 sec  MUSCLE TONE: LUE: Within functional limits  COGNITION: Overall cognitive status: Impaired and See ST note for further details  VISION: Subjective report: some boughts of double vision Baseline vision: Wears glasses for reading only and OTC readers Visual history: cataracts  VISION ASSESSMENT: Tracking/Visual pursuits: Decreased smoothness with horizontal tracking Visual Fields: Left visual field deficits  PERCEPTION: Impaired: Inattention/neglect: difficulty discriminating R vs L  OBSERVATIONS:  02/01/23: no  cane use now, still disuses Lt dominant side at times, favoring Rt hand when learning exercises. Some apparent confusion with directions at times and demonstrating back exercises that were just taught.    TODAY'S TREATMENT:                                                                                                                               - Neuro re-education completed for duration as noted below including: Using cognitive app on iPad, pt completed task sequencing improving from max cues and increased time to no cues for accurate completion. Therapist did cue pt to utilize LUE with completion and placement of iPad on L side to help with attention issues.  OT educated patient on use of sequence list for regular daily activities such as making coffee, making reservation, refilling a prescription, etc. to help improve independence and accuracy with ADLs and IADLs.  Patient verbalized understanding and handout was provided with list of sequence steps for refill of a prescription.  He was encouraged to cut out the steps individually and then arrange them in proper order for home practice.  Per subjective during previous visit, OT educated patient on the importance of continuing with OT for remaining deficits following CVA.  Patient agreeable.  PATIENT EDUCATION: Education details: Sequencing; OT POC Person educated: Patient Education method: Solicitor, and Handouts Education comprehension: verbalized understanding and needs further education & returns demo   Exercises - Full Fist  - 2-3 x daily - 5 reps - Seated Claw Fist with Putty  - 2-3 x daily - 5 reps - Finger Extension "Pizza!"   - 2-3 x daily - 5 reps - Thumb Press  - 2-3 x daily - 5 reps - Thumb Opposition with Putty  - 2-3 x daily - 5 reps - Finger Spread  - 2-3 x daily - 5 reps  HOME EXERCISE PROGRAM: 01/25/23: In-hand manipulations as listed above  02/01/23: Access Code: [email or web address] URL:  [email or web address]   GOALS:  SHORT TERM GOALS: Target date: 02/13/2023    Patient will demonstrate LUE HEP with 25% verbal cues or less for proper execution. Baseline: Goal status: 02/13/23: MET  2.  Patient will independently recall at least 2 compensatory  strategies for visual impairment without cueing. Baseline:  Goal status: 02/13/23: MET and vision no longer a concern  3.  Pt will independently recall at least 1 strategy for L inattention.  Baseline:  Goal status: 02/13/23: Not Met, but his wife has been present and cuing him   LONG TERM GOALS: Target date: 04/14/2023  Patient will demonstrate updated LUE HEP with 25% verbal cues or less for proper execution. Baseline:  Goal status: INITIAL  2.  Pt will complete FOTO at time of discharge.  Baseline:  Goal status: INITIAL  3.  Patient will complete nine-hole peg with use of L in 35 seconds or less. Baseline: 44.6 seconds 4/3: 33 seconds Goal status: MET  4.  Patient will demonstrate at least 55 lbs L grip strength as needed to open jars and other containers. Baseline: 41 lbs 4/3: 49 lbs Goal status: IN PROGRESS  5.  Pt will report improved writing legibility with use of L and AD as needed to at least 75% Baseline: 25% legibility Goal status: INITIAL   ASSESSMENT:  CLINICAL IMPRESSION: Will continue with skilled OT services as needed due to LUE weakness, sequencing, and L-sided neglect as needed for more safe and independent completion of ADLs and IADLs.  PERFORMANCE DEFICITS: in functional skills including ADLs, IADLs, coordination, proprioception, ROM, strength, pain, Fine motor control, mobility, endurance, decreased knowledge of precautions, vision, and UE functional use.    PLAN:  OT FREQUENCY: 2x/week  OT DURATION: 12 weeks  PLANNED INTERVENTIONS: self care/ADL training, therapeutic exercise, therapeutic activity, neuromuscular re-education, functional mobility training, electrical  stimulation, ultrasound, moist heat, patient/family education, cognitive remediation/compensation, visual/perceptual remediation/compensation, DME and/or AE instructions, and Re-evaluation  CONSULTED AND AGREED WITH PLAN OF CARE: Patient and family member/caregiver  PLAN FOR NEXT SESSION:  Create step-by-step list for cognitive/sequencing tasks especially as they applied to kitchen/IADL tasks; left-sided attention and use of LUE  Delana Meyer, OT 02/20/2023, 12:50 PM

## 2023-02-20 NOTE — Therapy (Signed)
OUTPATIENT SPEECH LANGUAGE PATHOLOGY TREATMENT NOTE   Patient Name: James Estrada MRN: [id number] DOB:1941/02/28, 82 y.o., male Today's Date: 02/20/2023  PCP: James Dills, MD REFERRING PROVIDER: Renford Dills, MD  END OF SESSION:   End of Session - 02/20/23 1400     Visit Number 14    Number of Visits 25    Date for SLP Re-Evaluation 03/20/23    Authorization Type Medicare    SLP Start Time 1359    SLP Stop Time  1445    SLP Time Calculation (min) 46 min    Activity Tolerance Patient tolerated treatment well               Past Medical History:  Diagnosis Date   AAA (abdominal aortic aneurysm)    last u/s done 07/18/17    Aneurysm of left internal iliac artery 05/22/2022   Arthritis    Bradycardia    CVA (cerebral vascular accident) 10/04/2022   Dysrhythmia    frequent PAC, for 20 years   GERD (gastroesophageal reflux disease)    occ, OTC   Hemorrhoids    History of hiatal hernia    Hyperlipidemia    Hypertension    S/P aortic dissection repair 09/18/2022   Seasonal allergies    Type 1 dissection of thoracic aorta 10/26/2022   Past Surgical History:  Procedure Laterality Date   ABDOMINAL AORTIC ENDOVASCULAR STENT GRAFT N/A 05/04/2020   Procedure: ABDOMINAL AORTIC ENDOVASCULAR STENT GRAFT;  Surgeon: James Earthly, MD;  Location: [facility] OR;  Service: Vascular;  Laterality: N/A;   CATARACT EXTRACTION Bilateral 2009   DIAGNOSTIC LAPAROSCOPY     laparoscopic hernia repair   EYE SURGERY Bilateral    cataract removal   HERNIA REPAIR Bilateral 1999, 2006   JOINT REPLACEMENT Right ~2018   hip replacement   pheochromocytoma  1993   PROSTATECTOMY N/A 05/15/2013   Procedure: PROSTATECTOMY RETROPUBIC; SIMPLE OPEN PROSTATECTOMY;  Surgeon: James Fuller, MD;  Location: [facility] ORS;  Service: Urology;  Laterality: N/A;   REPAIR OF ACUTE ASCENDING THORACIC AORTIC DISSECTION N/A 09/17/2022   Procedure: REPAIR OF ACUTE ASCENDING THORACIC AORTIC DISSECTION USING 28 MM HEMASHIELD  PLATINUM VASCULAR GRAFT;  Surgeon: James Slot, MD;  Location: [facility] OR;  Service: Vascular;  Laterality: N/A;  Median sternotomy   TOTAL ELBOW REPLACEMENT Left    > 30 years ago   TOTAL HIP ARTHROPLASTY Right 09/08/2017   Procedure: RIGHT TOTAL HIP ARTHROPLASTY ANTERIOR APPROACH;  Surgeon: James Hitch, MD;  Location: [facility] ORS;  Service: Orthopedics;  Laterality: Right;   ULTRASOUND GUIDANCE FOR VASCULAR ACCESS Bilateral 05/04/2020   Procedure: ULTRASOUND GUIDANCE FOR VASCULAR ACCESS;  Surgeon: James Earthly, MD;  Location: [facility] OR;  Service: Vascular;  Laterality: Bilateral;   Patient Active Problem List   Diagnosis Date Noted   Type 1 dissection of thoracic aorta 10/26/2022   CVA (cerebral vascular accident) 10/04/2022   Sepsis with acute renal failure without septic shock 09/30/2022   S/P aortic dissection repair 09/18/2022   Status post surgery 09/17/2022   Aneurysm of left internal iliac artery 05/22/2022   Colon cancer screening 05/22/2022   Essential hypertension 05/22/2022   Hemorrhoids 05/22/2022   Inguinal hernia 05/22/2022   Irritable bowel syndrome 05/22/2022   Mixed hyperlipidemia 05/22/2022   Pure hypercholesterolemia 05/22/2022   Thrombocytopenia 05/22/2022   Allergic rhinitis 05/22/2022   AAA (abdominal aortic aneurysm) without rupture 05/04/2020   Trochanteric bursitis, right hip 08/20/2018   History of right hip replacement 08/20/2018  Status post total replacement of right hip 09/08/2017   Pain of right hip joint 08/07/2017   Unilateral primary osteoarthritis, right hip 08/07/2017   BPH (benign prostatic hypertrophy) with urinary obstruction 05/15/2013    ONSET DATE: 09/17/22  REFERRING DIAG: R13.10 (ICD-10-CM) - Dysphagia, unspecified  THERAPY DIAG:  Aphasia  Rationale for Evaluation and Treatment: Rehabilitation  SUBJECTIVE:   SUBJECTIVE STATEMENT:"It's a little better. I'm talking on the phone, but I'm still reluctant to do it." "We're  making a little bit of progress." Re: communication  PAIN:  Are you having pain? No   OBJECTIVE:  Pt is a 82 y/o M admitted to [facility] on 09/17/22 with type 1 aortic dissection requiring emergent repair. Head CT 11/6 acute infarcts within R MCA involvement, including R cerebellum, basal ganglia, frontal lobe. ETT 11/4-11/9. PMH AAA s/p endovascular repair (2020), HTN, HLD, GERD.   TODAY'S TREATMENT:                                                                                                                                         DATE:   02/20/23: Pt completed PROM (communicative participation item bank - general short form). Endorsed most difficulty in fast-paced conversations. He has been texting with friends and family using previously discussed strategies, such as preparing his message in advance. Encouraged pt to practicing speaking over the phone. Score 14/30, improved by 5 points since eval  In structured writing task targeting anomia, pt demonstrated difficulty writing target sentences. Pt generated 5 written sentences and included 2 target words in each sentence, given usual mod-A to correct mistakes. HEP: Practice writing to prompt SLP provided via email  02/15/23: Targeted word finding, compensations for aphasia and cohesive discourse at conversation level. James StallionGeorge required usual min to mod verbal and questioning cues to use verbal compensations for anomic episodes. He relayed 2 family stories with occasional min questioning cues for sequence and to provide salient details. He verbalized 3 compensatory strategies for aphasia when making phone calls and providing his address and email address over the phone. (Write it down, practice script, self advocate) Written expression at phrase level required usual mod A to ID and correct errors 12/15x  02/13/23: Pt reported difficulty recalling his home and email address over the phone (when making reservations) and relying on his wife to provide it. Also  reported difficulty speaking over the phone with his friends.  Reported sometimes when he order food at a restaurant, he'll say the wrong word and he has become better about correcting himself.  Pt wanted to work on planning out his communication with his PCP later this week. SLP discussed importance of writing information down to support his memory and to plan his communication (e.g email address).  Targeted word-finding (spoken and written language) during structured divergent naming task (scattergories). Given occasional min to rare mod- A (semantic, gestural, phonemic cues), pt successfully generated complex, high  level words for 10/10 categories. HEP: Practice speaking over phone and providing email address and spelling it.  02/01/23: Targeted reading comprehension and narrative discourse reading 6-8 sentence paragraphs ([employer]) and giving 2-3 sentence cohesive summary. Daine generated accurate summaries with occasional min questioning cues for clarification and cohesion. Verbal expression targeted with Tradd stating his opinion on the articles, again with occasional min questioning cues to ID empty speech and for cohesion. Targeted word finding in complex naming with a given letter in 12 categories with minimal extended time and rare min A. Tirth summarized a movie plot accurately with mod I and min pausing for word finding.   01/30/23: Pt reported that he continues to experience moments of communicative difficulty in groups. Pt has been successful in summarizing brief reading passages, requested strategies on how to challenge himself. SLP discussed reading longer passages on more complex topics and then summarizing the material to a family member.  Targeted anomia, complex sentence generation utilizing VNeST (target verb: pour, monitor, defend, climb) to generate 3 subjects and objects for each verb for a total of 12. Given rare min A, pt generated three subject-object pairs. Pt then  answered when, where, why questions for each verb given rare min-A to generate 4 complex sentences.    PATIENT EDUCATION: Education details: See patient instructions, see today's treatment, compensations for aphasia Person educated: Patient and Spouse Education method: Explanation, Verbal cues, and Handouts Education comprehension: verbal cues required and needs further education  GOALS: Goals reviewed with patient? Yes   SHORT TERM GOALS: Target date: 01/23/23   Pt will name 10 items in personally relevant categories over 3 sessions with occasional min A Baseline: Goal status: MET   2.  Pt will write 4 item grocery list with occasional min A Baseline:  Goal status: MET   3.  Pt will respond to 3 texts with occasional min A over 1 week Baseline:  Goal status: MET   4.  Pt will generate complex verbal sentences 3x using Scientist, product/process development with rare min A over 2 sessions Baseline:  Goal status: MET   5.  Pt will comprehend 4 sentence passage/email with extended time and rare min A Baseline:  Goal status: MET   6.  Pt will employ compensations for aphasia in structured language tasks with occasional min A Baseline:  Goal status: MET   LONG TERM GOALS: Target date: 03/20/23   Pt will complete complex naming tasks with 80% accuracy and occasional min A Baseline:  Goal status: MET   2.  Pt will carryover verbal compensations for aphasia with occasional min A in conversation as needed over 2 sessions Baseline:  Goal status: MET   3.  Pt will carryover 3 strategies to successfully communicate in community with clerks, servers etc Baseline:  Goal status: MET   4.  Pt will write 3 sentence text or email with occasional min A, external aids and occasional min A  Baseline:  Goal status: ONGOING   5.  Pt will improve score on Communicative Participation Item Bank by 4 points Baseline: 14/30 Goal status: MET    ASSESSMENT:   CLINICAL IMPRESSION:  Targeted  writing and anomia this session in structured language tasks. Pt continues to make excellent progress and reports actively completing his HEP at home. Pt continues to benefit from ST intervention to maximize his communication and improve QoL at home and in the community.    OBJECTIVE IMPAIRMENTS: include memory and aphasia. These impairments are limiting  patient from managing medications, managing appointments, managing finances, household responsibilities, ADLs/IADLs, and effectively communicating at home and in community. Factors affecting potential to achieve goals and functional outcome are ability to learn/carryover information. Patient will benefit from skilled SLP services to address above impairments and improve overall function.   REHAB POTENTIAL: Good   PLAN:   SLP FREQUENCY: 2x/week   SLP DURATION: 12 weeks   PLANNED INTERVENTIONS: Language facilitation, Environmental controls, Cueing hierachy, Cognitive reorganization, Internal/external aids, Functional tasks, and Multimodal communication approach     Lovvorn, Radene Journey, CCC-SLP 02/20/2023, 3:51 PM

## 2023-02-20 NOTE — Therapy (Signed)
OUTPATIENT PHYSICAL THERAPY NEURO TREATMENT   Patient Name: James Estrada MRN: [id number] DOB:12/16/1940, 82 y.o., male Today's Date: 02/20/2023   PCP: Renford Dills, MD REFERRING PROVIDER: Dolores Patty, MD  END OF SESSION:  PT End of Session - 02/20/23 1319     Visit Number 11    Number of Visits 17    Date for PT Re-Evaluation 03/05/23    Authorization Type BCBS Medicare (needs 10th visit PN)    Progress Note Due on Visit 10    PT Start Time 1319   received from OT   PT Stop Time 1359    PT Time Calculation (min) 40 min    Equipment Utilized During Treatment Gait belt    Activity Tolerance Patient tolerated treatment well    Behavior During Therapy [facility] for tasks assessed/performed             PT End of Session - 02/20/23 1319     Visit Number 11    Number of Visits 17    Date for PT Re-Evaluation 03/05/23    Authorization Type BCBS Medicare (needs 10th visit PN)    Progress Note Due on Visit 10    PT Start Time 1319   received from OT   PT Stop Time 1359    PT Time Calculation (min) 40 min    Equipment Utilized During Treatment Gait belt    Activity Tolerance Patient tolerated treatment well    Behavior During Therapy [facility] for tasks assessed/performed                Past Medical History:  Diagnosis Date   AAA (abdominal aortic aneurysm)    last u/s done 07/18/17    Aneurysm of left internal iliac artery 05/22/2022   Arthritis    Bradycardia    CVA (cerebral vascular accident) 10/04/2022   Dysrhythmia    frequent PAC, for 20 years   GERD (gastroesophageal reflux disease)    occ, OTC   Hemorrhoids    History of hiatal hernia    Hyperlipidemia    Hypertension    S/P aortic dissection repair 09/18/2022   Seasonal allergies    Type 1 dissection of thoracic aorta 10/26/2022   Past Surgical History:  Procedure Laterality Date   ABDOMINAL AORTIC ENDOVASCULAR STENT GRAFT N/A 05/04/2020   Procedure: ABDOMINAL AORTIC ENDOVASCULAR STENT GRAFT;   Surgeon: Larina Earthly, MD;  Location: [facility] OR;  Service: Vascular;  Laterality: N/A;   CATARACT EXTRACTION Bilateral 2009   DIAGNOSTIC LAPAROSCOPY     laparoscopic hernia repair   EYE SURGERY Bilateral    cataract removal   HERNIA REPAIR Bilateral 1999, 2006   JOINT REPLACEMENT Right ~2018   hip replacement   pheochromocytoma  1993   PROSTATECTOMY N/A 05/15/2013   Procedure: PROSTATECTOMY RETROPUBIC; SIMPLE OPEN PROSTATECTOMY;  Surgeon: Valetta Fuller, MD;  Location: [facility] ORS;  Service: Urology;  Laterality: N/A;   REPAIR OF ACUTE ASCENDING THORACIC AORTIC DISSECTION N/A 09/17/2022   Procedure: REPAIR OF ACUTE ASCENDING THORACIC AORTIC DISSECTION USING 28 MM HEMASHIELD PLATINUM VASCULAR GRAFT;  Surgeon: Loreli Slot, MD;  Location: [facility] OR;  Service: Vascular;  Laterality: N/A;  Median sternotomy   TOTAL ELBOW REPLACEMENT Left    > 30 years ago   TOTAL HIP ARTHROPLASTY Right 09/08/2017   Procedure: RIGHT TOTAL HIP ARTHROPLASTY ANTERIOR APPROACH;  Surgeon: Kathryne Hitch, MD;  Location: [facility] ORS;  Service: Orthopedics;  Laterality: Right;   ULTRASOUND GUIDANCE FOR VASCULAR ACCESS Bilateral  05/04/2020   Procedure: ULTRASOUND GUIDANCE FOR VASCULAR ACCESS;  Surgeon: Larina Earthly, MD;  Location: [facility] OR;  Service: Vascular;  Laterality: Bilateral;   Patient Active Problem List   Diagnosis Date Noted   Type 1 dissection of thoracic aorta 10/26/2022   CVA (cerebral vascular accident) 10/04/2022   Sepsis with acute renal failure without septic shock 09/30/2022   S/P aortic dissection repair 09/18/2022   Status post surgery 09/17/2022   Aneurysm of left internal iliac artery 05/22/2022   Colon cancer screening 05/22/2022   Essential hypertension 05/22/2022   Hemorrhoids 05/22/2022   Inguinal hernia 05/22/2022   Irritable bowel syndrome 05/22/2022   Mixed hyperlipidemia 05/22/2022   Pure hypercholesterolemia 05/22/2022   Thrombocytopenia 05/22/2022   Allergic rhinitis 05/22/2022    AAA (abdominal aortic aneurysm) without rupture 05/04/2020   Trochanteric bursitis, right hip 08/20/2018   History of right hip replacement 08/20/2018   Status post total replacement of right hip 09/08/2017   Pain of right hip joint 08/07/2017   Unilateral primary osteoarthritis, right hip 08/07/2017   BPH (benign prostatic hypertrophy) with urinary obstruction 05/15/2013    ONSET DATE: 12/28/2022 (CVA on 09/17/22)  REFERRING DIAG: I63.19 (ICD-10-CM) - Cerebrovascular accident (CVA) due to embolism of other precerebral artery (HCC)  THERAPY DIAG:  Other lack of coordination  Muscle weakness (generalized)  Hemiplegia and hemiparesis following cerebral infarction affecting left dominant side  Unsteadiness on feet  Other symptoms and signs involving the nervous system  Rationale for Evaluation and Treatment: Rehabilitation  SUBJECTIVE:                                                                                                                                                                                             SUBJECTIVE STATEMENT: Pt denies acute changes, falls, or pain.  Patient states he has continued his weight training and using elliptical in the gym and has been doing well.     Pt accompanied by:  Self  PERTINENT HISTORY:  Pt is a 82 y/o M admitted to [facility] on 09/17/22 with type 1 aortic dissection requiring emergent repair. Head CT 11/6 acute infarcts within R MCA involvement, including R cerebellum, basal ganglia, frontal lobe. ETT 11/4-11/9. PMH AAA s/p endovascular repair (2020), HTN, HLD, GERD. Hospitalized 09/18/79 to 10/21/23.Had HHOT, PT, SLP.    PAIN:  Are you having pain? No  VITALS There were no vitals filed for this visit.      PRECAUTIONS: Fall  OBJECTIVE:  TODAY'S TREATMENT:    Reviewed and modified HEP: Standing hamstring curls x8 each LE w/ red theraband, pt reports this remains challenging on the  left side Heel raises x12 Tandem at counter  progressed to no UE support 2 rounds each foot in rear variable second holds Near stance in corner unsupported eyes closed 2x40 seconds w/ increased sway noted with each round Tandem forward walking progressing to 3 fingertip support 4x10' Forward walking w/ head turns x10' + x50', pt deviates to side of head turn mildly w/o LOB Forward walking w/ head turns and naming finger count on that side, moderate deviation to side of head turn w/ prolonged stare Advance-retreat over 4" hurdle w/ lateral ball toss alternating LE, decreased pace of task and increased cuing and patient had great difficulty establishing sequence and maintaining Side-stepping ball toss to wall > side-stepping w/ pattern - toss to wall and bounce to floor > side-stepping w/ pattern - toss to wall and bounce to floor and name states-3 repetitions of states, w/ all tasks pt has frequent switch-up of sequence and is unable to fluidly step and complete cognitive or secondary manual task  PATIENT EDUCATION: Education details:  Continue HEP w/ modifications.  Discussion of cognitive dual task difficulties noted today as compared to prior sessions. Person educated: Patient and Spouse Education method: Explanation and Verbal cues Education comprehension: verbalized understanding and needs further education  HOME EXERCISE PROGRAM: Access Code: [email or web address] URL: [email or web address] Date: 02/20/2023 Prepared by: Camille BalMarissa Tonnie Stillman  Exercises - Standing Hamstring Curl with Resistance  - 1 x daily - 5 x weekly - 2 sets - 12 reps - Corner Balance Feet Together With Eyes Closed  - 1 x daily - 5 x weekly - 1 sets - 2 reps - 30-45 seconds hold - Standing Tandem Balance with Counter Support  - 1 x daily - 5 x weekly - 1 sets - 2 reps - 30-45 seconds hold - Tandem Walking with Counter Support  - 1 x daily - 5 x weekly - 3 sets - 10 reps - Walking with Head Rotation  - 1 x daily - 5 x weekly - 3 sets - 10 reps  GOALS: Goals  reviewed with patient? Yes  SHORT TERM GOALS: Target date: 02/02/23  Pt will be IND with initial HEP in order to indicate improved functional mobility and dec fall risk. Baseline: Pt not performing regularly  Goal status: IN PROGRESS  2.  Pt will ambulate 150' without AD mod I level in order to indicate more independent household ambulation.   Baseline:  Goal status: MET  3.  Pt will improve FGA to >/=23/30 in order to indicate dec fall risk.  Baseline: 19/30; 22/30 (3/20) Goal status: NOT MET  4.  Pt will maintain gait speed to >/=3.0 ft/sec w/o AD while scanning environment without overt LOB in order to indicate dec fall risk.  Baseline: 3.11 ft/s on 3/20  Goal status: MET  5.  Will assess MCTSIB and update goals to reflect progress.  Baseline: Pt scored 120/120 at baseline  Goal status: MET  6.  Pt will negotiate up/down 4 steps with single rail in alt pattern, up/down ramp and curb and ambulate x 500' outdoors over unlevel paved surfaces at S level without AD in order to indicate improved community mobility.   Baseline:  Goal status: MET  LONG TERM GOALS: Target date: 03/05/23  Pt will be IND with final HEP in order to indicate improved functional mobility and dec fall risk. Baseline:  Goal status: INITIAL  2.  Pt will improve FGA to >/=26/30 in order to indicate dec fall risk.   Baseline: 19/30 Goal  status: INITIAL  3.  Will add LTG for MTCSIB once assessed.  Baseline: pt scored 120/120 at baseline Goal status: MET  4.  Pt will negotiate up/down 8 steps with single rail, up/down ramp and curb and ambulate x 1000' outdoors over unlevel paved surfaces without AD at mod I level while scanning environment in order to indicate improved community mobility.   Baseline:  Goal status: INITIAL    ASSESSMENT:  CLINICAL IMPRESSION: Reviewed and modified session today to meet current functional level of patient.  Progressed session to more challenging cognitive and  manual dual tasks using more complex movements than sessions prior.  Pt has great ongoing difficulty with maintained sequencing and fluidity of movements.  He continues to benefit from skilled PT to assist in bridging the gap between his cognitive and physical deficits to diminish fall risk in challenging settings.  OBJECTIVE IMPAIRMENTS: Abnormal gait, cardiopulmonary status limiting activity, decreased activity tolerance, decreased balance, decreased endurance, decreased mobility, decreased strength, and impaired perceived functional ability.   ACTIVITY LIMITATIONS: carrying, lifting, standing, squatting, stairs, bathing, and locomotion level  PARTICIPATION LIMITATIONS: meal prep, medication management, personal finances, driving, shopping, community activity, and yard work  PERSONAL FACTORS: Age and 3+ comorbidities: see above  are also affecting patient's functional outcome.   REHAB POTENTIAL: Good  CLINICAL DECISION MAKING: Evolving/moderate complexity  EVALUATION COMPLEXITY: Moderate  PLAN:  PT FREQUENCY: 2x/week  PT DURATION: 8 weeks  PLANNED INTERVENTIONS: Therapeutic exercises, Therapeutic activity, Neuromuscular re-education, Balance training, Gait training, Patient/Family education, Self Care, and Joint mobilization  PLAN FOR NEXT SESSION:  Add to HEP prn, outdoor gait-dual task, obstacle course, rebounder drills, blaze pods in // bars-add obstacle course and UE/LE distinction tasks w/ distracting colors, tilt board ball toss w/ cognitive dual task  Camille Bal, PT, DPT [facility] [address] [place], [place]  [address] Phone:  [phone] Fax:  [phone] 02/20/23, 5:59 PM

## 2023-02-20 NOTE — Patient Instructions (Signed)
Find your prescription bottle Locate the pharmacy phone number on the prescription bottle Call the phone number on your bottle to contact the pharmacy Follow the phone prompts to request a medication refill Provide the prescription number of the medication you are needing refilled Pick up your prescription

## 2023-02-20 NOTE — Patient Instructions (Signed)
Access Code: [email or web address] URL: [email or web address] Date: 02/20/2023 Prepared by: Camille Bal  Exercises - Standing Hamstring Curl with Resistance  - 1 x daily - 5 x weekly - 2 sets - 12 reps - Corner Balance Feet Together With Eyes Closed  - 1 x daily - 5 x weekly - 1 sets - 2 reps - 30-45 seconds hold - Standing Tandem Balance with Counter Support  - 1 x daily - 5 x weekly - 1 sets - 2 reps - 30-45 seconds hold - Tandem Walking with Counter Support  - 1 x daily - 5 x weekly - 3 sets - 10 reps - Walking with Head Rotation  - 1 x daily - 5 x weekly - 3 sets - 10 reps

## 2023-02-22 ENCOUNTER — Encounter: Payer: Self-pay | Admitting: Physical Therapy

## 2023-02-22 ENCOUNTER — Ambulatory Visit: Payer: Medicare Other | Admitting: Occupational Therapy

## 2023-02-22 ENCOUNTER — Ambulatory Visit: Payer: Medicare Other | Admitting: Speech Pathology

## 2023-02-22 ENCOUNTER — Encounter: Payer: Self-pay | Admitting: Speech Pathology

## 2023-02-22 ENCOUNTER — Encounter: Payer: Self-pay | Admitting: Occupational Therapy

## 2023-02-22 ENCOUNTER — Ambulatory Visit: Payer: Medicare Other | Admitting: Physical Therapy

## 2023-02-22 DIAGNOSIS — R278 Other lack of coordination: Secondary | ICD-10-CM

## 2023-02-22 DIAGNOSIS — R41842 Visuospatial deficit: Secondary | ICD-10-CM

## 2023-02-22 DIAGNOSIS — M6281 Muscle weakness (generalized): Secondary | ICD-10-CM

## 2023-02-22 DIAGNOSIS — R29818 Other symptoms and signs involving the nervous system: Secondary | ICD-10-CM

## 2023-02-22 DIAGNOSIS — I69352 Hemiplegia and hemiparesis following cerebral infarction affecting left dominant side: Secondary | ICD-10-CM

## 2023-02-22 DIAGNOSIS — R4701 Aphasia: Secondary | ICD-10-CM

## 2023-02-22 DIAGNOSIS — R2681 Unsteadiness on feet: Secondary | ICD-10-CM | POA: Diagnosis not present

## 2023-02-22 DIAGNOSIS — I6319 Cerebral infarction due to embolism of other precerebral artery: Secondary | ICD-10-CM | POA: Diagnosis not present

## 2023-02-22 DIAGNOSIS — R2689 Other abnormalities of gait and mobility: Secondary | ICD-10-CM | POA: Diagnosis not present

## 2023-02-22 DIAGNOSIS — R41841 Cognitive communication deficit: Secondary | ICD-10-CM | POA: Diagnosis not present

## 2023-02-22 NOTE — Therapy (Signed)
OUTPATIENT PHYSICAL THERAPY NEURO TREATMENT   Patient Name: James Estrada MRN: [id number] DOB:May 19, 1941, 82 y.o., male Today's Date: 02/22/2023   PCP: Renford Dills, MD REFERRING PROVIDER: Dolores Patty, MD  END OF SESSION:  PT End of Session - 02/22/23 1322     Visit Number 12    Number of Visits 17    Date for PT Re-Evaluation 03/05/23    Authorization Type BCBS Medicare (needs 10th visit PN)    Progress Note Due on Visit 10    PT Start Time 1320   Handoff from OT   PT Stop Time 1403    PT Time Calculation (min) 43 min    Equipment Utilized During Treatment Gait belt    Activity Tolerance Patient tolerated treatment well    Behavior During Therapy [facility] for tasks assessed/performed             PT End of Session - 02/22/23 1322     Visit Number 12    Number of Visits 17    Date for PT Re-Evaluation 03/05/23    Authorization Type BCBS Medicare (needs 10th visit PN)    Progress Note Due on Visit 10    PT Start Time 1320   Handoff from OT   PT Stop Time 1403    PT Time Calculation (min) 43 min    Equipment Utilized During Treatment Gait belt    Activity Tolerance Patient tolerated treatment well    Behavior During Therapy WFL for tasks assessed/performed                Past Medical History:  Diagnosis Date   AAA (abdominal aortic aneurysm)    last u/s done 07/18/17    Aneurysm of left internal iliac artery 05/22/2022   Arthritis    Bradycardia    CVA (cerebral vascular accident) 10/04/2022   Dysrhythmia    frequent PAC, for 20 years   GERD (gastroesophageal reflux disease)    occ, OTC   Hemorrhoids    History of hiatal hernia    Hyperlipidemia    Hypertension    S/P aortic dissection repair 09/18/2022   Seasonal allergies    Type 1 dissection of thoracic aorta 10/26/2022   Past Surgical History:  Procedure Laterality Date   ABDOMINAL AORTIC ENDOVASCULAR STENT GRAFT N/A 05/04/2020   Procedure: ABDOMINAL AORTIC ENDOVASCULAR STENT GRAFT;   Surgeon: Larina Earthly, MD;  Location: [facility] OR;  Service: Vascular;  Laterality: N/A;   CATARACT EXTRACTION Bilateral 2009   DIAGNOSTIC LAPAROSCOPY     laparoscopic hernia repair   EYE SURGERY Bilateral    cataract removal   HERNIA REPAIR Bilateral 1999, 2006   JOINT REPLACEMENT Right ~2018   hip replacement   pheochromocytoma  1993   PROSTATECTOMY N/A 05/15/2013   Procedure: PROSTATECTOMY RETROPUBIC; SIMPLE OPEN PROSTATECTOMY;  Surgeon: Valetta Fuller, MD;  Location: [facility] ORS;  Service: Urology;  Laterality: N/A;   REPAIR OF ACUTE ASCENDING THORACIC AORTIC DISSECTION N/A 09/17/2022   Procedure: REPAIR OF ACUTE ASCENDING THORACIC AORTIC DISSECTION USING 28 MM HEMASHIELD PLATINUM VASCULAR GRAFT;  Surgeon: Loreli Slot, MD;  Location: [facility] OR;  Service: Vascular;  Laterality: N/A;  Median sternotomy   TOTAL ELBOW REPLACEMENT Left    > 30 years ago   TOTAL HIP ARTHROPLASTY Right 09/08/2017   Procedure: RIGHT TOTAL HIP ARTHROPLASTY ANTERIOR APPROACH;  Surgeon: Kathryne Hitch, MD;  Location: [facility] ORS;  Service: Orthopedics;  Laterality: Right;   ULTRASOUND GUIDANCE FOR VASCULAR ACCESS Bilateral  05/04/2020   Procedure: ULTRASOUND GUIDANCE FOR VASCULAR ACCESS;  Surgeon: Larina EarthlyEarly, Todd F, MD;  [facility];  Service: Vascular;  Laterality: Bilateral;   Patient Active Problem List   Diagnosis Date Noted   Type 1 dissection of thoracic aorta 10/26/2022   CVA (cerebral vascular accident) 10/04/2022   Sepsis with acute renal failure without septic shock 09/30/2022   S/P aortic dissection repair 09/18/2022   Status post surgery 09/17/2022   Aneurysm of left internal iliac artery 05/22/2022   Colon cancer screening 05/22/2022   Essential hypertension 05/22/2022   Hemorrhoids 05/22/2022   Inguinal hernia 05/22/2022   Irritable bowel syndrome 05/22/2022   Mixed hyperlipidemia 05/22/2022   Pure hypercholesterolemia 05/22/2022   Thrombocytopenia 05/22/2022   Allergic rhinitis 05/22/2022    AAA (abdominal aortic aneurysm) without rupture 05/04/2020   Trochanteric bursitis, right hip 08/20/2018   History of right hip replacement 08/20/2018   Status post total replacement of right hip 09/08/2017   Pain of right hip joint 08/07/2017   Unilateral primary osteoarthritis, right hip 08/07/2017   BPH (benign prostatic hypertrophy) with urinary obstruction 05/15/2013    ONSET DATE: 12/28/2022 (CVA on 09/17/22)  REFERRING DIAG: I63.19 (ICD-10-CM) - Cerebrovascular accident (CVA) due to embolism of other precerebral artery (HCC)  THERAPY DIAG:  Other lack of coordination  Muscle weakness (generalized)  Hemiplegia and hemiparesis following cerebral infarction affecting left dominant side  Other symptoms and signs involving the nervous system  Rationale for Evaluation and Treatment: Rehabilitation  SUBJECTIVE:                                                                                                                                                                                             SUBJECTIVE STATEMENT: Pt denies acute changes, falls, or pain.  Patient states he and wife are moving to [place] in coming weeks.   Pt accompanied by:  Self  PERTINENT HISTORY:  Pt is a 82 y/o M [facility] with type 1 aortic dissection requiring emergent repair. Head CT 11/6 acute infarcts within R MCA involvement, including R cerebellum, basal ganglia, frontal lobe. ETT 11/4-11/9. PMH AAA s/p endovascular repair (2020), HTN, HLD, GERD. Hospitalized 09/18/23 to 10/21/23.Had HHOT, PT, SLP.    PAIN:  Are you having pain? No  VITALS There were no vitals filed for this visit.  PRECAUTIONS: Fall  OBJECTIVE:  TODAY'S TREATMENT:    Blaze pods (1 minute rounds, 30 seconds rest between using 2 color association for L/R discrimination and motor planning) -Rounds 1-3 pt unable to complete more than 3 hits w/ varied surface heights having patient associate  Left w/ green  light and right with blue light and tapping w/ hand vs foot appropriately -Modified rounds 4-6 in just UE discrimination w/ pt progressing to single error on last round w/ max 20 hits.  Improvement noted w/ calling associated hand aloud on rounds 5 and 6. -Rounds 6-9 added in 2 floor pods for LE L/R discrimination in addition to UE, pt has more trouble associating LE correctly.  -Obstacle course x4 progressing from minA to CGA using 4" hurdle, 6 therastones, and 8" step up and over  PATIENT EDUCATION: Education details:  Discussed re-cert vs discharge next session.  Pt feels he may not need PT anymore.  Continue HEP and regular activity.   Person educated: Patient and Spouse Education method: Explanation and Verbal cues Education comprehension: verbalized understanding and needs further education  HOME EXERCISE PROGRAM: Access Code: [email or web address] URL: [email or web address] Date: 02/20/2023 Prepared by: Camille Bal  Exercises - Standing Hamstring Curl with Resistance  - 1 x daily - 5 x weekly - 2 sets - 12 reps - Corner Balance Feet Together With Eyes Closed  - 1 x daily - 5 x weekly - 1 sets - 2 reps - 30-45 seconds hold - Standing Tandem Balance with Counter Support  - 1 x daily - 5 x weekly - 1 sets - 2 reps - 30-45 seconds hold - Tandem Walking with Counter Support  - 1 x daily - 5 x weekly - 3 sets - 10 reps - Walking with Head Rotation  - 1 x daily - 5 x weekly - 3 sets - 10 reps  GOALS: Goals reviewed with patient? Yes  SHORT TERM GOALS: Target date: 02/02/23  Pt will be IND with initial HEP in order to indicate improved functional mobility and dec fall risk. Baseline: Pt not performing regularly  Goal status: IN PROGRESS  2.  Pt will ambulate 150' without AD mod I level in order to indicate more independent household ambulation.   Baseline:  Goal status: MET  3.  Pt will improve FGA to >/=23/30 in order to indicate dec fall risk.  Baseline: 19/30; 22/30  (3/20) Goal status: NOT MET  4.  Pt will maintain gait speed to >/=3.0 ft/sec w/o AD while scanning environment without overt LOB in order to indicate dec fall risk.  Baseline: 3.11 ft/s on 3/20  Goal status: MET  5.  Will assess MCTSIB and update goals to reflect progress.  Baseline: Pt scored 120/120 at baseline  Goal status: MET  6.  Pt will negotiate up/down 4 steps with single rail in alt pattern, up/down ramp and curb and ambulate x 500' outdoors over unlevel paved surfaces at S level without AD in order to indicate improved community mobility.   Baseline:  Goal status: MET  LONG TERM GOALS: Target date: 03/05/23  Pt will be IND with final HEP in order to indicate improved functional mobility and dec fall risk. Baseline:  Goal status: INITIAL  2.  Pt will improve FGA to >/=26/30 in order to indicate dec fall risk.   Baseline: 19/30 Goal status: INITIAL  3.  Will add LTG for MTCSIB once assessed.  Baseline: pt scored 120/120 at baseline Goal status: MET  4.  Pt will negotiate up/down 8 steps with single rail, up/down ramp and curb and ambulate x 1000' outdoors over unlevel paved surfaces without AD at mod I level while scanning environment in order to indicate improved community mobility.   Baseline:  Goal status: INITIAL    ASSESSMENT:  CLINICAL IMPRESSION: Emphasis of skilled session today on left and right discriminatory standing tasks w/ added association component using blaze pods.  Patient extremely challenged by this w/ difficulty completing tasks without consistent cues.  He demonstrates great dynamic balance today with obstacle negotiation progressed to CGA level with challenging SLS and variable heights.  Overall, he has made great physical progress with PT, but remains moderately limited by cognitive challenges involving divided attention, association, sequencing, and dual tasking.  Will plan to assess LTGs next session with tentative plan to discharge from PT  services.  OBJECTIVE IMPAIRMENTS: Abnormal gait, cardiopulmonary status limiting activity, decreased activity tolerance, decreased balance, decreased endurance, decreased mobility, decreased strength, and impaired perceived functional ability.   ACTIVITY LIMITATIONS: carrying, lifting, standing, squatting, stairs, bathing, and locomotion level  PARTICIPATION LIMITATIONS: meal prep, medication management, personal finances, driving, shopping, community activity, and yard work  PERSONAL FACTORS: Age and 3+ comorbidities: see above  are also affecting patient's functional outcome.   REHAB POTENTIAL: Good  CLINICAL DECISION MAKING: Evolving/moderate complexity  EVALUATION COMPLEXITY: Moderate  PLAN:  PT FREQUENCY: 2x/week  PT DURATION: 8 weeks  PLANNED INTERVENTIONS: Therapeutic exercises, Therapeutic activity, Neuromuscular re-education, Balance training, Gait training, Patient/Family education, Self Care, and Joint mobilization  PLAN FOR NEXT SESSION:  ASSESS LTGS-D/C? Add to HEP prn, outdoor gait-dual task, obstacle course, rebounder drills, blaze pods in // bars-add obstacle course and UE/LE distinction tasks w/ distracting colors, tilt board ball toss w/ cognitive dual task  Camille Bal, PT, DPT [facility] [address] [place], [place]  [address] Phone:  [phone] Fax:  [phone] 02/22/23, 2:06 PM

## 2023-02-22 NOTE — Therapy (Signed)
OUTPATIENT OCCUPATIONAL THERAPY NEURO TREATMENT NOTE   Patient Name: Elane FritzGeorge E Doddridge MRN: [id number] DOB:[birth year], 82 y.o., male Today's Date: 02/22/2023  PCP: Renford Dillsonald Polite, MD  REFERRING PROVIDER: Dolores PattyBensimhon, Daniel R, MD  END OF SESSION:  OT End of Session - 02/22/23 1237     Visit Number 6    Number of Visits 13    Date for OT Re-Evaluation 04/14/23    Authorization Type BCBS - no auth required    Progress Note Due on Visit 10    OT Start Time 1235    OT Stop Time 1315    OT Time Calculation (min) 40 min    Activity Tolerance Patient tolerated treatment well;No increased pain;Patient limited by lethargy    Behavior During Therapy [facility] for tasks assessed/performed             Past Medical History:  Diagnosis Date   AAA (abdominal aortic aneurysm)    last u/s done 07/18/17    Aneurysm of left internal iliac artery 05/22/2022   Arthritis    Bradycardia    CVA (cerebral vascular accident) 10/04/2022   Dysrhythmia    frequent PAC, for 20 years   GERD (gastroesophageal reflux disease)    occ, OTC   Hemorrhoids    History of hiatal hernia    Hyperlipidemia    Hypertension    S/P aortic dissection repair 09/18/2022   Seasonal allergies    Type 1 dissection of thoracic aorta 10/26/2022   Past Surgical History:  Procedure Laterality Date   ABDOMINAL AORTIC ENDOVASCULAR STENT GRAFT N/A 05/04/2020   Procedure: ABDOMINAL AORTIC ENDOVASCULAR STENT GRAFT;  Surgeon: Larina EarthlyEarly, Todd F, MD;  [facility];  Service: Vascular;  Laterality: N/A;   CATARACT EXTRACTION Bilateral 2009   DIAGNOSTIC LAPAROSCOPY     laparoscopic hernia repair   EYE SURGERY Bilateral    cataract removal   HERNIA REPAIR Bilateral 1999, 2006   JOINT REPLACEMENT Right ~2018   hip replacement   pheochromocytoma  1993   PROSTATECTOMY N/A 05/15/2013   Procedure: PROSTATECTOMY RETROPUBIC; SIMPLE OPEN PROSTATECTOMY;  Surgeon: Valetta Fulleravid S Grapey, MD;  Location: [facility] ORS;  Service: Urology;  Laterality: N/A;    REPAIR OF ACUTE ASCENDING THORACIC AORTIC DISSECTION N/A 09/17/2022   Procedure: REPAIR OF ACUTE ASCENDING THORACIC AORTIC DISSECTION USING 28 MM HEMASHIELD PLATINUM VASCULAR GRAFT;  Surgeon: Loreli SlotHendrickson, Steven C, MD;  Location: [facility] OR;  Service: Vascular;  Laterality: N/A;  Median sternotomy   TOTAL ELBOW REPLACEMENT Left    > 30 years ago   TOTAL HIP ARTHROPLASTY Right 09/08/2017   Procedure: RIGHT TOTAL HIP ARTHROPLASTY ANTERIOR APPROACH;  Surgeon: Kathryne HitchBlackman, Christopher Y, MD;  Location: [facility];  Service: Orthopedics;  Laterality: Right;   ULTRASOUND GUIDANCE FOR VASCULAR ACCESS Bilateral 05/04/2020   Procedure: ULTRASOUND GUIDANCE FOR VASCULAR ACCESS;  Surgeon: Larina EarthlyEarly, Todd F, MD;  [facility];  Service: Vascular;  Laterality: Bilateral;   Patient Active Problem List   Diagnosis Date Noted   Type 1 dissection of thoracic aorta 10/26/2022   CVA (cerebral vascular accident) 10/04/2022   Sepsis with acute renal failure without septic shock 09/30/2022   S/P aortic dissection repair 09/18/2022   Status post surgery 09/17/2022   Aneurysm of left internal iliac artery 05/22/2022   Colon cancer screening 05/22/2022   Essential hypertension 05/22/2022   Hemorrhoids 05/22/2022   Inguinal hernia 05/22/2022   Irritable bowel syndrome 05/22/2022   Mixed hyperlipidemia 05/22/2022   Pure hypercholesterolemia 05/22/2022   Thrombocytopenia 05/22/2022  Allergic rhinitis 05/22/2022   AAA (abdominal aortic aneurysm) without rupture 05/04/2020   Trochanteric bursitis, right hip 08/20/2018   History of right hip replacement 08/20/2018   Status post total replacement of right hip 09/08/2017   Pain of right hip joint 08/07/2017   Unilateral primary osteoarthritis, right hip 08/07/2017   BPH (benign prostatic hypertrophy) with urinary obstruction 05/15/2013    ONSET DATE: 12/28/2022 (CVA on 09/17/22)   REFERRING DIAG: I63.19 (ICD-10-CM) - Cerebrovascular accident (CVA) due to embolism of other  precerebral artery  THERAPY DIAG:  Other lack of coordination  Muscle weakness (generalized)  Hemiplegia and hemiparesis following cerebral infarction affecting left dominant side  Other symptoms and signs involving the nervous system  Visuospatial deficit  Cerebrovascular accident (CVA) due to embolism of other precerebral artery  Rationale for Evaluation and Treatment: Rehabilitation  PERTINENT HISTORY:  "Pt is a 82 y/o M admitted to [facility] on 09/17/22 with type 1 aortic dissection requiring emergent repair. Head CT 11/6 acute infarcts within R MCA involvement, including R cerebellum, basal ganglia, frontal lobe. ETT 11/4-11/9. PMH AAA s/p endovascular repair (2020), HTN, HLD, GERD. Hosoitalized 09/18/23 to 10/21/23.Had HHOT, PT, SLP."   Has had limited L supination since elbow fracture prior to CVA. He has been using block letters for writing but is highly motivated to work on his handwriting in therapy.    PRECAUTIONS: Fall;  WEIGHT BEARING RESTRICTIONS: No   SUBJECTIVE:   SUBJECTIVE STATEMENT: He has been preparing for his move and has not done his task sequencing homework. He and his wife will be   Pt accompanied by: Wife - Kathie Rhodes  PAIN:  Are you having pain? No   FALLS: Has patient fallen in last 6 months? Yes. Number of falls 1  LIVING ENVIRONMENT: Lives with: lives with their family Lives in: House/apartment (lives at daughters house-will be moving to [place] in April/May to single home).  Stairs: Yes: Internal: flight but doesn't use steps; on left going up and External: 2 steps; none Has following equipment at home: Single point cane, shower chair, Grab bars, and tub/shower    PLOF: Independent, retired Paediatric nurse (lots of computer use), golf, went to the gym on a daily basis, was an avid gardener; driving   PATIENT GOALS: return to PLOF    OBJECTIVE:  (All objective assessments below are from initial evaluation on: 01/16/23 unless otherwise specified.)    HAND DOMINANCE: Left  ADLs: Overall ADLs: mostly mod I with assistance in cutting up meat  IADLs: Shopping: max A Meal Prep: liked to cook and bake - has been able to make some meals with min A Community mobility: dependent Medication management: dependent Financial management: dependent Handwriting: 25% legible  MOBILITY STATUS: Needs Assist: SPC for community mobility  ACTIVITY TOLERANCE: Activity tolerance: Fair  FUNCTIONAL OUTCOME MEASURES: FOTO: to be completed at a later date  UPPER EXTREMITY ROM:     AROM Right (eval) Left (eval)  Shoulder flexion WNL WNL  Shoulder abduction WNL WNL  Elbow flexion WNL WNL  Elbow extension WNL WNL  Wrist flexion WNL WNL  Wrist extension WNL WNL  Wrist pronation WNL WNL  Wrist supination WNL Impaired (chronic)   Digit Composite Flexion WNL WNL  Digit Composite Extension WNL WNL  Digit Opposition WNL WNL  (Blank rows = not tested)  UPPER EXTREMITY MMT:     MMT Right (eval) Left (eval)  Shoulder flexion WNL WFL  Shoulder abduction WNL WFL  Elbow flexion WNL WFL  Elbow extension  WNL WFL  (Blank rows = not tested)  HAND FUNCTION: 02/15/23: Lt Grip Strength: 49#  Grip strength: Right: 72.9 lbs; Left: 41 lbs  COORDINATION: 02/15/23: 9HPT: Lt 33 sec  Finger Nose Finger test: impaired on L 9 Hole Peg test: Right: 33.5 sec; Left: 44.6 sec  MUSCLE TONE: LUE: Within functional limits  COGNITION: Overall cognitive status: Impaired and See ST note for further details  VISION: Subjective report: some boughts of double vision Baseline vision: Wears glasses for reading only and OTC readers Visual history: cataracts  VISION ASSESSMENT: Tracking/Visual pursuits: Decreased smoothness with horizontal tracking Visual Fields: Left visual field deficits  PERCEPTION: Impaired: Inattention/neglect: difficulty discriminating R vs L  OBSERVATIONS:  02/01/23: no cane use now, still disuses Lt dominant side at times, favoring Rt  hand when learning exercises. Some apparent confusion with directions at times and demonstrating back exercises that were just taught.    TODAY'S TREATMENT:                                                                                                                               Using cognitive app on iPad, pt completed task sequencing improving from mod cues and increased time to no cues for accurate completion. Therapist did cue pt to utilize LUE with completion to help with attention issues and improved functional use.  Pt completed kitchen  task obtaining items in therapy kitchen following a list. Pt requiring mod cues for location of items and min cues to put items away.   Per discussion last visit, OT encouraged pt and wife to schedule more OT visits.   PATIENT EDUCATION: Education details: Sequencing; OT POC Person educated: Patient Education method: Solicitor, and Handouts Education comprehension: verbalized understanding and needs further education & returns demo   Exercises - Full Fist  - 2-3 x daily - 5 reps - Seated Claw Fist with Putty  - 2-3 x daily - 5 reps - Finger Extension "Pizza!"   - 2-3 x daily - 5 reps - Thumb Press  - 2-3 x daily - 5 reps - Thumb Opposition with Putty  - 2-3 x daily - 5 reps - Finger Spread  - 2-3 x daily - 5 reps  HOME EXERCISE PROGRAM: 01/25/23: In-hand manipulations as listed above  02/01/23: Access Code: [email or web address] URL: [email or web address]   GOALS:  SHORT TERM GOALS: Target date: 02/13/2023    Patient will demonstrate LUE HEP with 25% verbal cues or less for proper execution. Baseline: Goal status: 02/13/23: MET  2.  Patient will independently recall at least 2 compensatory strategies for visual impairment without cueing. Baseline:  Goal status: 02/13/23: MET and vision no longer a concern  3.  Pt will independently recall at least 1 strategy for L inattention.  Baseline:  Goal status: 02/13/23: Not Met,  but his wife has been present and cuing him   LONG TERM GOALS: Target date: 04/14/2023  Patient will demonstrate updated LUE HEP  with 25% verbal cues or less for proper execution. Baseline:  Goal status: INITIAL  2.  Pt will complete FOTO at time of discharge.  Baseline:  Goal status: INITIAL  3.  Patient will complete nine-hole peg with use of L in 35 seconds or less. Baseline: 44.6 seconds 4/3: 33 seconds Goal status: MET  4.  Patient will demonstrate at least 55 lbs L grip strength as needed to open jars and other containers. Baseline: 41 lbs 4/3: 49 lbs Goal status: IN PROGRESS  5.  Pt will report improved writing legibility with use of L and AD as needed to at least 75% Baseline: 25% legibility Goal status: INITIAL   ASSESSMENT:  CLINICAL IMPRESSION: Will continue with skilled OT services as needed due to LUE weakness, sequencing, and L-sided neglect as needed for more safe and independent completion of ADLs and IADLs.  PERFORMANCE DEFICITS: in functional skills including ADLs, IADLs, coordination, proprioception, ROM, strength, pain, Fine motor control, mobility, endurance, decreased knowledge of precautions, vision, and UE functional use.    PLAN:  OT FREQUENCY: 2x/week  OT DURATION: 12 weeks  PLANNED INTERVENTIONS: self care/ADL training, therapeutic exercise, therapeutic activity, neuromuscular re-education, functional mobility training, electrical stimulation, ultrasound, moist heat, patient/family education, cognitive remediation/compensation, visual/perceptual remediation/compensation, DME and/or AE instructions, and Re-evaluation  CONSULTED AND AGREED WITH PLAN OF CARE: Patient and family member/caregiver  PLAN FOR NEXT SESSION:  ASSESS GOALS including re-assessment of vision; Create step-by-step list for cognitive/sequencing tasks especially as applied to kitchen/IADL tasks; left-sided attention and use of LUE  Delana Meyer, OT 02/22/2023, 12:43 PM

## 2023-02-22 NOTE — Therapy (Signed)
OUTPATIENT SPEECH LANGUAGE PATHOLOGY TREATMENT NOTE   Patient Name: James Estrada MRN: [id number] DOB:[birth year], 82 y.o., male Today's Date: 02/22/2023  PCP: Renford Dills, MD REFERRING PROVIDER: Renford Dills, MD  END OF SESSION:   End of Session - 02/22/23 1406     Visit Number 15    Number of Visits 25    Date for SLP Re-Evaluation 03/20/23    Authorization Type Medicare    SLP Start Time 1400    SLP Stop Time  1445    SLP Time Calculation (min) 45 min               Past Medical History:  Diagnosis Date   AAA (abdominal aortic aneurysm)    last u/s done 07/18/17    Aneurysm of left internal iliac artery 05/22/2022   Arthritis    Bradycardia    CVA (cerebral vascular accident) 10/04/2022   Dysrhythmia    frequent PAC, for 20 years   GERD (gastroesophageal reflux disease)    occ, OTC   Hemorrhoids    History of hiatal hernia    Hyperlipidemia    Hypertension    S/P aortic dissection repair 09/18/2022   Seasonal allergies    Type 1 dissection of thoracic aorta 10/26/2022   Past Surgical History:  Procedure Laterality Date   ABDOMINAL AORTIC ENDOVASCULAR STENT GRAFT N/A 05/04/2020   Procedure: ABDOMINAL AORTIC ENDOVASCULAR STENT GRAFT;  Surgeon: Larina Earthly, MD;  Location: [facility] OR;  Service: Vascular;  Laterality: N/A;   CATARACT EXTRACTION Bilateral 2009   DIAGNOSTIC LAPAROSCOPY     laparoscopic hernia repair   EYE SURGERY Bilateral    cataract removal   HERNIA REPAIR Bilateral 1999, 2006   JOINT REPLACEMENT Right ~2018   hip replacement   pheochromocytoma  1993   PROSTATECTOMY N/A 05/15/2013   Procedure: PROSTATECTOMY RETROPUBIC; SIMPLE OPEN PROSTATECTOMY;  Surgeon: Valetta Fuller, MD;  Location: [facility];  Service: Urology;  Laterality: N/A;   REPAIR OF ACUTE ASCENDING THORACIC AORTIC DISSECTION N/A 09/17/2022   Procedure: REPAIR OF ACUTE ASCENDING THORACIC AORTIC DISSECTION USING 28 MM HEMASHIELD PLATINUM VASCULAR GRAFT;  Surgeon: Loreli Slot, MD;  Location: [facility] OR;  Service: Vascular;  Laterality: N/A;  Median sternotomy   TOTAL ELBOW REPLACEMENT Left    > 30 years ago   TOTAL HIP ARTHROPLASTY Right 09/08/2017   Procedure: RIGHT TOTAL HIP ARTHROPLASTY ANTERIOR APPROACH;  Surgeon: Kathryne Hitch, MD;  Location: [facility] OR[facility];  Service: Orthopedics;  Laterality: Right;   ULTRASOUND GUIDANCE FOR VASCULAR ACCESS Bilateral 05/04/2020   Procedure: ULTRASOUND GUIDANCE FOR VASCULAR ACCESS;  Surgeon: Larina Earthly, MD;  Location: [facility] OR;  Service: Vascular;  Laterality: Bilateral;   Patient Active Problem List   Diagnosis Date Noted   Type 1 dissection of thoracic aorta 10/26/2022   CVA (cerebral vascular accident) 10/04/2022   Sepsis with acute renal failure without septic shock 09/30/2022   S/P aortic dissection repair 09/18/2022   Status post surgery 09/17/2022   Aneurysm of left internal iliac artery 05/22/2022   Colon cancer screening 05/22/2022   Essential hypertension 05/22/2022   Hemorrhoids 05/22/2022   Inguinal hernia 05/22/2022   Irritable bowel syndrome 05/22/2022   Mixed hyperlipidemia 05/22/2022   Pure hypercholesterolemia 05/22/2022   Thrombocytopenia 05/22/2022   Allergic rhinitis 05/22/2022   AAA (abdominal aortic aneurysm) without rupture 05/04/2020   Trochanteric bursitis, right hip 08/20/2018   History of right hip replacement 08/20/2018   Status post total replacement of right hip  09/08/2017   Pain of right hip joint 08/07/2017   Unilateral primary osteoarthritis, right hip 08/07/2017   BPH (benign prostatic hypertrophy) with urinary obstruction 05/15/2013    ONSET DATE: 09/17/22  REFERRING DIAG: R13.10 (ICD-10-CM) - Dysphagia, unspecified  THERAPY DIAG:  Aphasia  Rationale for Evaluation and Treatment: Rehabilitation  SUBJECTIVE:   SUBJECTIVE STATEMENT:"We are moving after next week so we will miss a week" PAIN:  Are you having pain? No   OBJECTIVE:  Pt is a 82 y/o M [facility]  [facility] with type 1 aortic dissection requiring emergent repair. Head CT 11/6 acute infarcts within R MCA involvement, including R cerebellum, basal ganglia, frontal lobe. ETT 11/4-11/9. PMH AAA s/p endovascular repair (2020), HTN, HLD, GERD.   TODAY'S TREATMENT:                                                                                                                                         DATE:   02/22/23: James Estrada continues to send and respond to family texts with success. Targeted verbal sequencing and written expression generating 4 step sequencing ADL's and IADL's - James Estrada sequenced the steps with occasional min A. Written expression at phrase level James Estrada self checked for errors - no errors over 12 phrases. Collaborated with James Estrada and Kathie RhodesBetty to write new goals - goals 6-8 added today. James Estrada met previous LTG's.  02/20/23: Pt completed PROM (communicative participation item bank - general short form). Endorsed most difficulty in fast-paced conversations and giving detailed information.. He has been texting with friends and family using previously discussed strategies, such as preparing his message in advance. Encouraged pt to practicing speaking over the phone. Score 14/30, improved by 5 points since eval  In structured writing task targeting anomia, pt demonstrated difficulty writing target sentences. Pt generated 5 written sentences and included 2 target words in each sentence, given usual mod-A to correct mistakes. HEP: Practice writing to prompt SLP provided via email  02/15/23: Targeted word finding, compensations for aphasia and cohesive discourse at conversation level. James Estrada required usual min to mod verbal and questioning cues to use verbal compensations for anomic episodes. He relayed 2 family stories with occasional min questioning cues for sequence and to provide salient details. He verbalized 3 compensatory strategies for aphasia when making phone calls and providing his address and  email address over the phone. (Write it down, practice script, self advocate) Written expression at phrase level required usual mod A to ID and correct errors 12/15x  02/13/23: Pt reported difficulty recalling his home and email address over the phone (when making reservations) and relying on his wife to provide it. Also reported difficulty speaking over the phone with his friends.  Reported sometimes when he order food at a restaurant, he'll say the wrong word and he has become better about correcting himself.  Pt wanted to work on planning out his communication with his  PCP later this week. SLP discussed importance of writing information down to support his memory and to plan his communication (e.g email address).  Targeted word-finding (spoken and written language) during structured divergent naming task (scattergories). Given occasional min to rare mod- A (semantic, gestural, phonemic cues), pt successfully generated complex, high level words for 10/10 categories. HEP: Practice speaking over phone and providing email address and spelling it.        PATIENT EDUCATION: Education details: See patient instructions, see today's treatment, compensations for aphasia Person educated: Patient and Spouse Education method: Explanation, Verbal cues, and Handouts Education comprehension: verbal cues required and needs further education  GOALS: Goals reviewed with patient? Yes   SHORT TERM GOALS: Target date: 01/23/23   Pt will name 10 items in personally relevant categories over 3 sessions with occasional min A Baseline: Goal status: MET   2.  Pt will write 4 item grocery list with occasional min A Baseline:  Goal status: MET   3.  Pt will respond to 3 texts with occasional min A over 1 week Baseline:  Goal status: MET   4.  Pt will generate complex verbal sentences 3x using Scientist, product/process development with rare min A over 2 sessions Baseline:  Goal status: MET   5.  Pt will  comprehend 4 sentence passage/email with extended time and rare min A Baseline:  Goal status: MET   6.  Pt will employ compensations for aphasia in structured language tasks with occasional min A Baseline:  Goal status: MET   LONG TERM GOALS: Target date: 03/20/23   Pt will complete complex naming tasks with 80% accuracy and occasional min A Baseline:  Goal status: MET   2.  Pt will carryover verbal compensations for aphasia with occasional min A in conversation as needed over 2 sessions Baseline:  Goal status: MET   3.  Pt will carryover 3 strategies to successfully communicate in community with clerks, servers etc Baseline:  Goal status: MET   4.  Pt will write 3 sentence text or email with occasional min A, external aids and occasional min A  Baseline:  Goal status: MET   5.  Pt will improve score on Communicative Participation Item Bank by 4 points Baseline: 14/30 Goal status: MET  6. Pt will comprehend 3-5 paragraph story or article by accurately summarizing salient details in cohesive summary with rare min A             Goal Status: INITIAL (added 02/22/23)  7. Pt will generate 5-7 sentence paragraph or email, Iding and correcting errors with occasional min A              Goal Status: INITIAL (added 02/22/23)  8. Pt will generate detailed, cohesive summary of a movie, news, article show or story requiring 3 or less requests for clarification               Goal Status: INITIAL (added 02/22/23)  9. Pt will carryover 2 compensations to make 3 phone calls to friends  or businesses with rare min A               Goal Status: INITIAL (added 02/22/23)    ASSESSMENT:   CLINICAL IMPRESSION:  Targeted writing and anomia this session in structured language tasks. Pt continues to make excellent progress and reports actively completing his HEP at home. Pt continues to benefit from ST intervention to maximize his communication and improve QoL at home and in the community.  OBJECTIVE  IMPAIRMENTS: include memory and aphasia. These impairments are limiting patient from managing medications, managing appointments, managing finances, household responsibilities, ADLs/IADLs, and effectively communicating at home and in community. Factors affecting potential to achieve goals and functional outcome are ability to learn/carryover information. Patient will benefit from skilled SLP services to address above impairments and improve overall function.   REHAB POTENTIAL: Good   PLAN:   SLP FREQUENCY: 2x/week   SLP DURATION: 12 weeks   PLANNED INTERVENTIONS: Language facilitation, Environmental controls, Cueing hierachy, Cognitive reorganization, Internal/external aids, Functional tasks, and Multimodal communication approach     Deverick Pruss, Radene Journey, CCC-SLP 02/22/2023, 2:57 PM

## 2023-02-24 DIAGNOSIS — I480 Paroxysmal atrial fibrillation: Secondary | ICD-10-CM | POA: Diagnosis not present

## 2023-02-27 ENCOUNTER — Encounter: Payer: Medicare Other | Admitting: Occupational Therapy

## 2023-02-27 ENCOUNTER — Ambulatory Visit: Payer: Medicare Other

## 2023-02-27 ENCOUNTER — Ambulatory Visit: Payer: Medicare Other | Admitting: Physical Therapy

## 2023-03-01 ENCOUNTER — Ambulatory Visit: Payer: Medicare Other | Admitting: Speech Pathology

## 2023-03-01 ENCOUNTER — Ambulatory Visit: Payer: Medicare Other | Admitting: Physical Therapy

## 2023-03-01 ENCOUNTER — Ambulatory Visit: Payer: Medicare Other | Admitting: Occupational Therapy

## 2023-03-01 DIAGNOSIS — I69352 Hemiplegia and hemiparesis following cerebral infarction affecting left dominant side: Secondary | ICD-10-CM | POA: Diagnosis not present

## 2023-03-01 DIAGNOSIS — I6319 Cerebral infarction due to embolism of other precerebral artery: Secondary | ICD-10-CM | POA: Diagnosis not present

## 2023-03-01 DIAGNOSIS — R2689 Other abnormalities of gait and mobility: Secondary | ICD-10-CM | POA: Diagnosis not present

## 2023-03-01 DIAGNOSIS — R4701 Aphasia: Secondary | ICD-10-CM | POA: Diagnosis not present

## 2023-03-01 DIAGNOSIS — R29818 Other symptoms and signs involving the nervous system: Secondary | ICD-10-CM | POA: Diagnosis not present

## 2023-03-01 DIAGNOSIS — R2681 Unsteadiness on feet: Secondary | ICD-10-CM | POA: Diagnosis not present

## 2023-03-01 DIAGNOSIS — R41842 Visuospatial deficit: Secondary | ICD-10-CM | POA: Diagnosis not present

## 2023-03-01 DIAGNOSIS — M6281 Muscle weakness (generalized): Secondary | ICD-10-CM | POA: Diagnosis not present

## 2023-03-01 DIAGNOSIS — R41841 Cognitive communication deficit: Secondary | ICD-10-CM | POA: Diagnosis not present

## 2023-03-01 DIAGNOSIS — R278 Other lack of coordination: Secondary | ICD-10-CM | POA: Diagnosis not present

## 2023-03-01 NOTE — Therapy (Signed)
OUTPATIENT SPEECH LANGUAGE PATHOLOGY TREATMENT NOTE   Patient Name: SVEN PINHEIRO MRN: [id number] DOB:[birth year], 82 y.o., male Today's Date: 03/03/2023  PCP: Renford Dills, MD REFERRING PROVIDER: Renford Dills, MD  END OF SESSION:       Past Medical History:  Diagnosis Date   AAA (abdominal aortic aneurysm)    last u/s done 07/18/17    Aneurysm of left internal iliac artery 05/22/2022   Arthritis    Bradycardia    CVA (cerebral vascular accident) 10/04/2022   Dysrhythmia    frequent PAC, for 20 years   GERD (gastroesophageal reflux disease)    occ, OTC   Hemorrhoids    History of hiatal hernia    Hyperlipidemia    Hypertension    S/P aortic dissection repair 09/18/2022   Seasonal allergies    Type 1 dissection of thoracic aorta 10/26/2022   Past Surgical History:  Procedure Laterality Date   ABDOMINAL AORTIC ENDOVASCULAR STENT GRAFT N/A 05/04/2020   Procedure: ABDOMINAL AORTIC ENDOVASCULAR STENT GRAFT;  Surgeon: Larina Earthly, MD;  Location: [facility] OR;  Service: Vascular;  Laterality: N/A;   CATARACT EXTRACTION Bilateral 2009   DIAGNOSTIC LAPAROSCOPY     laparoscopic hernia repair   EYE SURGERY Bilateral    cataract removal   HERNIA REPAIR Bilateral 1999, 2006   JOINT REPLACEMENT Right ~2018   hip replacement   pheochromocytoma  1993   PROSTATECTOMY N/A 05/15/2013   Procedure: PROSTATECTOMY RETROPUBIC; SIMPLE OPEN PROSTATECTOMY;  Surgeon: Valetta Fuller, MD;  Location: [facility];  Service: Urology;  Laterality: N/A;   REPAIR OF ACUTE ASCENDING THORACIC AORTIC DISSECTION N/A 09/17/2022   Procedure: REPAIR OF ACUTE ASCENDING THORACIC AORTIC DISSECTION USING 28 MM HEMASHIELD PLATINUM VASCULAR GRAFT;  Surgeon: Loreli Slot, MD;  Location: [facility] OR;  Service: Vascular;  Laterality: N/A;  Median sternotomy   TOTAL ELBOW REPLACEMENT Left    > 30 years ago   TOTAL HIP ARTHROPLASTY Right 09/08/2017   Procedure: RIGHT TOTAL HIP ARTHROPLASTY ANTERIOR APPROACH;  Surgeon:  Kathryne Hitch, MD;  Location: [facility];  Service: Orthopedics;  Laterality: Right;   ULTRASOUND GUIDANCE FOR VASCULAR ACCESS Bilateral 05/04/2020   Procedure: ULTRASOUND GUIDANCE FOR VASCULAR ACCESS;  Surgeon: Larina Earthly, MD;  Location: [facility] OR;  Service: Vascular;  Laterality: Bilateral;   Patient Active Problem List   Diagnosis Date Noted   Type 1 dissection of thoracic aorta 10/26/2022   CVA (cerebral vascular accident) 10/04/2022   Sepsis with acute renal failure without septic shock 09/30/2022   S/P aortic dissection repair 09/18/2022   Status post surgery 09/17/2022   Aneurysm of left internal iliac artery 05/22/2022   Colon cancer screening 05/22/2022   Essential hypertension 05/22/2022   Hemorrhoids 05/22/2022   Inguinal hernia 05/22/2022   Irritable bowel syndrome 05/22/2022   Mixed hyperlipidemia 05/22/2022   Pure hypercholesterolemia 05/22/2022   Thrombocytopenia 05/22/2022   Allergic rhinitis 05/22/2022   AAA (abdominal aortic aneurysm) without rupture 05/04/2020   Trochanteric bursitis, right hip 08/20/2018   History of right hip replacement 08/20/2018   Status post total replacement of right hip 09/08/2017   Pain of right hip joint 08/07/2017   Unilateral primary osteoarthritis, right hip 08/07/2017   BPH (benign prostatic hypertrophy) with urinary obstruction 05/15/2013    ONSET DATE: 09/17/22  REFERRING DIAG: R13.10 (ICD-10-CM) - Dysphagia, unspecified  THERAPY DIAG:  Aphasia  Rationale for Evaluation and Treatment: Rehabilitation  SUBJECTIVE:   SUBJECTIVE STATEMENT:"I found this frustrating" re: 5 sentence paragraph PAIN:  Are you having pain? No   OBJECTIVE:  Pt is a 82 y/o M admitted to [facility] on 09/17/22 with type 1 aortic dissection requiring emergent repair. Head CT 11/6 acute infarcts within R MCA involvement, including R cerebellum, basal ganglia, frontal lobe. ETT 11/4-11/9. PMH AAA s/p endovascular repair (2020), HTN, HLD, GERD.   TODAY'S  TREATMENT:                                                                                                                                         DATE:   03/01/23: Tavyn reports frustration with written expression writing paragraph level persuation. HW of 4-5 sentence persuasive paragraph with 6 aphasic errors which required mod verbal cues to corrrect. Targeted pursuasive verbal expression and reading comprehension reading and generating verbal summary and opinion of 2 articles. In persuasive narrative (college athletes being paid, AI affecting jobs and businesses) [name] required occasional mod questioning cues/requests for clarification to verbalize 3 pros and cons for each topic in cohesive manner. He did verbalize 3 reasons for his opinion for each topic. HW - written 4-5 sentence paragraph re: his argument for or against means testing for social security.  02/22/23: Koichi continues to send and respond to family texts with success. Targeted verbal sequencing and written expression generating 4 step sequencing ADL's and IADL's - Omare sequenced the steps with occasional min A. Written expression at phrase level Omar self checked for errors - no errors over 12 phrases. Collaborated with [name] and Kathie Rhodes to write new goals - goals 6-8 added today. Ebenezer met previous LTG's.  02/20/23: Pt completed PROM (communicative participation item bank - general short form). Endorsed most difficulty in fast-paced conversations and giving detailed information.. He has been texting with friends and family using previously discussed strategies, such as preparing his message in advance. Encouraged pt to practicing speaking over the phone. Score 14/30, improved by 5 points since eval  In structured writing task targeting anomia, pt demonstrated difficulty writing target sentences. Pt generated 5 written sentences and included 2 target words in each sentence, given usual mod-A to correct mistakes. HEP: Practice writing  to prompt SLP provided via email  02/15/23: Targeted word finding, compensations for aphasia and cohesive discourse at conversation level. [name] required usual min to mod verbal and questioning cues to use verbal compensations for anomic episodes. He relayed 2 family stories with occasional min questioning cues for sequence and to provide salient details. He verbalized 3 compensatory strategies for aphasia when making phone calls and providing his address and email address over the phone. (Write it down, practice script, self advocate) Written expression at phrase level required usual mod A to ID and correct errors 12/15x  02/13/23: Pt reported difficulty recalling his home and email address over the phone (when making reservations) and relying on his wife to provide it. Also reported difficulty speaking over the phone with his friends.  Reported sometimes when he order food at a restaurant, he'll say the wrong word and he has become better about correcting himself.  Pt wanted to work on planning out his communication with his PCP later this week. SLP discussed importance of writing information down to support his memory and to plan his communication (e.g email address).  Targeted word-finding (spoken and written language) during structured divergent naming task (scattergories). Given occasional min to rare mod- A (semantic, gestural, phonemic cues), pt successfully generated complex, high level words for 10/10 categories. HEP: Practice speaking over phone and providing email address and spelling it.        PATIENT EDUCATION: Education details: See patient instructions, see today's treatment, compensations for aphasia Person educated: Patient and Spouse Education method: Explanation, Verbal cues, and Handouts Education comprehension: verbal cues required and needs further education  GOALS: Goals reviewed with patient? Yes   SHORT TERM GOALS: Target date: 01/23/23   Pt will name 10 items in  personally relevant categories over 3 sessions with occasional min A Baseline: Goal status: MET   2.  Pt will write 4 item grocery list with occasional min A Baseline:  Goal status: MET   3.  Pt will respond to 3 texts with occasional min A over 1 week Baseline:  Goal status: MET   4.  Pt will generate complex verbal sentences 3x using Scientist, product/process development with rare min A over 2 sessions Baseline:  Goal status: MET   5.  Pt will comprehend 4 sentence passage/email with extended time and rare min A Baseline:  Goal status: MET   6.  Pt will employ compensations for aphasia in structured language tasks with occasional min A Baseline:  Goal status: MET   LONG TERM GOALS: Target date: 03/20/23   Pt will complete complex naming tasks with 80% accuracy and occasional min A Baseline:  Goal status: MET   2.  Pt will carryover verbal compensations for aphasia with occasional min A in conversation as needed over 2 sessions Baseline:  Goal status: MET   3.  Pt will carryover 3 strategies to successfully communicate in community with clerks, servers etc Baseline:  Goal status: MET   4.  Pt will write 3 sentence text or email with occasional min A, external aids and occasional min A  Baseline:  Goal status: MET   5.  Pt will improve score on Communicative Participation Item Bank by 4 points Baseline: 14/30 Goal status: MET  6. Pt will comprehend 3-5 paragraph story or article by accurately summarizing salient details in cohesive summary with rare min A             Goal Status: INITIAL (added 02/22/23)  7. Pt will generate 5-7 sentence paragraph or email, Iding and correcting errors with occasional min A              Goal Status: INITIAL (added 02/22/23)  8. Pt will generate detailed, cohesive summary of a movie, news, article show or story requiring 3 or less requests for clarification               Goal Status: INITIAL (added 02/22/23)  9. Pt will carryover 2  compensations to make 3 phone calls to friends  or businesses with rare min A               Goal Status: INITIAL (added 02/22/23)    ASSESSMENT:   CLINICAL IMPRESSION:  Targeted writing and anomia this session in structured language  tasks. Pt continues to make excellent progress and reports actively completing his HEP at home. Verbal expression for simple conversation is with mod I and mildly complex conversation with rare min A and occasional halting for word finding. Written expression at sentence level is accurate with rare min Aand paragraph level with occasional aphasic errors, occasional mod A to ID and correct errors. Reading comprehension intact now at sentence and simple paragraph level. Ongoing extended time and occasional min to mod A for moderately complex paragraphs and short articles. Pt continues to benefit from ST intervention to maximize his communication and improve QoL at home and in the community.    OBJECTIVE IMPAIRMENTS: include memory and aphasia. These impairments are limiting patient from managing medications, managing appointments, managing finances, household responsibilities, ADLs/IADLs, and effectively communicating at home and in community. Factors affecting potential to achieve goals and functional outcome are ability to learn/carryover information. Patient will benefit from skilled SLP services to address above impairments and improve overall function.   REHAB POTENTIAL: Good   PLAN:   SLP FREQUENCY: 2x/week   SLP DURATION: 12 weeks   PLANNED INTERVENTIONS: Language facilitation, Environmental controls, Cueing hierachy, Cognitive reorganization, Internal/external aids, Functional tasks, and Multimodal communication approach     Oral Remache, Radene Journey, CCC-SLP 03/03/2023, 1:19 PM

## 2023-03-01 NOTE — Therapy (Addendum)
OUTPATIENT PHYSICAL THERAPY NEURO TREATMENT- DISCHARGE SUMMARY   Patient Name: James Estrada MRN: [id number] DOB:July 14, 1941, 82 y.o., male Today's Date: 03/01/2023   PCP: Renford Dills, MD REFERRING PROVIDER: Dolores Patty, MD  PHYSICAL THERAPY DISCHARGE SUMMARY  Visits from Start of Care: 13  Current functional level related to goals / functional outcomes: Pt is mod I with all mobility without use of AD   Remaining deficits: Impaired word finding, delayed processing, mild L inattention    Education / Equipment: HEP, encouragement to return to activities (golf, gym)    Patient agrees to discharge. Patient goals were  not assessed as pt feels as though he is back to baseline . Patient is being discharged due to maximized rehab potential.    END OF SESSION:  PT End of Session - 03/01/23 1405     Visit Number 13    Number of Visits 17    Date for PT Re-Evaluation 03/05/23    Authorization Type BCBS Medicare (needs 10th visit PN)    Progress Note Due on Visit 10    PT Start Time 1403    PT Stop Time 1425   DC   PT Time Calculation (min) 22 min    Equipment Utilized During Treatment --    Activity Tolerance Patient tolerated treatment well    Behavior During Therapy WFL for tasks assessed/performed                Past Medical History:  Diagnosis Date   AAA (abdominal aortic aneurysm)    last u/s done 07/18/17    Aneurysm of left internal iliac artery 05/22/2022   Arthritis    Bradycardia    CVA (cerebral vascular accident) 10/04/2022   Dysrhythmia    frequent PAC, for 20 years   GERD (gastroesophageal reflux disease)    occ, OTC   Hemorrhoids    History of hiatal hernia    Hyperlipidemia    Hypertension    S/P aortic dissection repair 09/18/2022   Seasonal allergies    Type 1 dissection of thoracic aorta 10/26/2022   Past Surgical History:  Procedure Laterality Date   ABDOMINAL AORTIC ENDOVASCULAR STENT GRAFT N/A 05/04/2020   Procedure:  ABDOMINAL AORTIC ENDOVASCULAR STENT GRAFT;  Surgeon: Larina Earthly, MD;  Location: [facility] OR;  Service: Vascular;  Laterality: N/A;   CATARACT EXTRACTION Bilateral 2009   DIAGNOSTIC LAPAROSCOPY     laparoscopic hernia repair   EYE SURGERY Bilateral    cataract removal   HERNIA REPAIR Bilateral 1999, 2006   JOINT REPLACEMENT Right ~2018   hip replacement   pheochromocytoma  1993   PROSTATECTOMY N/A 05/15/2013   Procedure: PROSTATECTOMY RETROPUBIC; SIMPLE OPEN PROSTATECTOMY;  Surgeon: Valetta Fuller, MD;  Location: [facility] ORS;  Service: Urology;  Laterality: N/A;   REPAIR OF ACUTE ASCENDING THORACIC AORTIC DISSECTION N/A 09/17/2022   Procedure: REPAIR OF ACUTE ASCENDING THORACIC AORTIC DISSECTION USING 28 MM HEMASHIELD PLATINUM VASCULAR GRAFT;  Surgeon: Loreli Slot, MD;  Location: [facility] OR;  Service: Vascular;  Laterality: N/A;  Median sternotomy   TOTAL ELBOW REPLACEMENT Left    > 30 years ago   TOTAL HIP ARTHROPLASTY Right 09/08/2017   Procedure: RIGHT TOTAL HIP ARTHROPLASTY ANTERIOR APPROACH;  Surgeon: Kathryne Hitch, MD;  Location: [facility] ORS;  Service: Orthopedics;  Laterality: Right;   ULTRASOUND GUIDANCE FOR VASCULAR ACCESS Bilateral 05/04/2020   Procedure: ULTRASOUND GUIDANCE FOR VASCULAR ACCESS;  Surgeon: Larina Earthly, MD;  Location: [facility] OR;  Service: Vascular;  Laterality: Bilateral;   Patient Active Problem List   Diagnosis Date Noted   Type 1 dissection of thoracic aorta 10/26/2022   CVA (cerebral vascular accident) 10/04/2022   Sepsis with acute renal failure without septic shock 09/30/2022   S/P aortic dissection repair 09/18/2022   Status post surgery 09/17/2022   Aneurysm of left internal iliac artery 05/22/2022   Colon cancer screening 05/22/2022   Essential hypertension 05/22/2022   Hemorrhoids 05/22/2022   Inguinal hernia 05/22/2022   Irritable bowel syndrome 05/22/2022   Mixed hyperlipidemia 05/22/2022   Pure hypercholesterolemia 05/22/2022   Thrombocytopenia  05/22/2022   Allergic rhinitis 05/22/2022   AAA (abdominal aortic aneurysm) without rupture 05/04/2020   Trochanteric bursitis, right hip 08/20/2018   History of right hip replacement 08/20/2018   Status post total replacement of right hip 09/08/2017   Pain of right hip joint 08/07/2017   Unilateral primary osteoarthritis, right hip 08/07/2017   BPH (benign prostatic hypertrophy) with urinary obstruction 05/15/2013    ONSET DATE: 12/28/2022 (CVA on 09/17/22)  REFERRING DIAG: I63.19 (ICD-10-CM) - Cerebrovascular accident (CVA) due to embolism of other precerebral artery (HCC)  THERAPY DIAG:  Other abnormalities of gait and mobility  Rationale for Evaluation and Treatment: Rehabilitation  SUBJECTIVE:                                                                                                                                                                                             SUBJECTIVE STATEMENT: Pt denies acute changes, falls, or pain.  Move-in date for [facility] has been delayed until June.    Pt accompanied by:  Self  PERTINENT HISTORY:  Pt is a 82 y/o M admitted to [facility] on 09/17/22 with type 1 aortic dissection requiring emergent repair. Head CT 11/6 acute infarcts within R MCA involvement, including R cerebellum, basal ganglia, frontal lobe. ETT 11/4-11/9. PMH AAA s/p endovascular repair (2020), HTN, HLD, GERD. Hospitalized 09/18/23 to 10/21/23.Had HHOT, PT, SLP.    PAIN:  Are you having pain? No  VITALS There were no vitals filed for this visit.  PRECAUTIONS: Fall  OBJECTIVE:  TODAY'S TREATMENT:    Ther Act  Discussed pt preference on whether he feels ready to DC today. Pt reports he feels as though he is back to his baseline and will just need time to work on his slow processing. Wife reports she would like for him to continue therapy to stimulate him, but is supportive w/pt's desire. Informed pt and wife that a personal trainer would be good for pt to push his  cardiovascular fitness and strength, which has direct benefit w/neuroplasticity. Also encouraged  pt to return to sport (golf) at a driving range level first to work on motor planning and processing. Informed pt to start w/putting and progress to swing as able. Pt reports he no longer has personal goals for therapy and would like to wrap up. Pt and wife verbalized understanding and are in agreement to DC this date.  PATIENT EDUCATION: Education details:  See above Person educated: Patient and Spouse Education method: Explanation and Verbal cues Education comprehension: verbalized understanding and needs further education  HOME EXERCISE PROGRAM: Access Code: [email or web address] URL: [email or web address] Date: 02/20/2023 Prepared by: Camille Bal  Exercises - Standing Hamstring Curl with Resistance  - 1 x daily - 5 x weekly - 2 sets - 12 reps - Corner Balance Feet Together With Eyes Closed  - 1 x daily - 5 x weekly - 1 sets - 2 reps - 30-45 seconds hold - Standing Tandem Balance with Counter Support  - 1 x daily - 5 x weekly - 1 sets - 2 reps - 30-45 seconds hold - Tandem Walking with Counter Support  - 1 x daily - 5 x weekly - 3 sets - 10 reps - Walking with Head Rotation  - 1 x daily - 5 x weekly - 3 sets - 10 reps  GOALS: Goals reviewed with patient? Yes  SHORT TERM GOALS: Target date: 02/02/23  Pt will be IND with initial HEP in order to indicate improved functional mobility and dec fall risk. Baseline: Pt not performing regularly  Goal status: IN PROGRESS  2.  Pt will ambulate 150' without AD mod I level in order to indicate more independent household ambulation.   Baseline:  Goal status: MET  3.  Pt will improve FGA to >/=23/30 in order to indicate dec fall risk.  Baseline: 19/30; 22/30 (3/20) Goal status: NOT MET  4.  Pt will maintain gait speed to >/=3.0 ft/sec w/o AD while scanning environment without overt LOB in order to indicate dec fall risk.  Baseline: 3.11  ft/s on 3/20  Goal status: MET  5.  Will assess MCTSIB and update goals to reflect progress.  Baseline: Pt scored 120/120 at baseline  Goal status: MET  6.  Pt will negotiate up/down 4 steps with single rail in alt pattern, up/down ramp and curb and ambulate x 500' outdoors over unlevel paved surfaces at S level without AD in order to indicate improved community mobility.   Baseline:  Goal status: MET  LONG TERM GOALS: Target date: 03/05/23  Pt will be IND with final HEP in order to indicate improved functional mobility and dec fall risk. Baseline:  Goal status: MET  2.  Pt will improve FGA to >/=26/30 in order to indicate dec fall risk.   Baseline: 19/30 Goal status: NOT MET  3.  Will add LTG for MTCSIB once assessed.  Baseline: pt scored 120/120 at baseline Goal status: MET  4.  Pt will negotiate up/down 8 steps with single rail, up/down ramp and curb and ambulate x 1000' outdoors over unlevel paved surfaces without AD at mod I level while scanning environment in order to indicate improved community mobility.   Baseline: Pt has been active in community (going to store, restaurants, etc.)  Goal status: MET    ASSESSMENT:  CLINICAL IMPRESSION: Emphasis of skilled session on LTG assessment and DC from PT. Pt has met 3 of 4 goals, with the 4th goal not being formally assessed this date as pt ready to finish therapy. Pt is ambulating in community  mod I w/o AD and reports being at his PLOF physically. Pt continues to be limited by impaired word finding and delayed processing, but has had no falls and has significantly improved his L inattention to the point where he can walk independently. Pt in agreement to DC this date due to returning to [facility] and wanting break from therapy.   OBJECTIVE IMPAIRMENTS: Abnormal gait, cardiopulmonary status limiting activity, decreased activity tolerance, decreased balance, decreased endurance, decreased mobility, decreased strength, and impaired  perceived functional ability.   ACTIVITY LIMITATIONS: carrying, lifting, standing, squatting, stairs, bathing, and locomotion level  PARTICIPATION LIMITATIONS: meal prep, medication management, personal finances, driving, shopping, community activity, and yard work  PERSONAL FACTORS: Age and 3+ comorbidities: see above  are also affecting patient's functional outcome.   REHAB POTENTIAL: Good  CLINICAL DECISION MAKING: Evolving/moderate complexity  EVALUATION COMPLEXITY: Moderate  PLAN:  PT FREQUENCY: 2x/week  PT DURATION: 8 weeks  PLANNED INTERVENTIONS: Therapeutic exercises, Therapeutic activity, Neuromuscular re-education, Balance training, Gait training, Patient/Family education, Self Care, and Joint mobilization   Jill Alexanders Emad Brechtel, PT, DPT [facility] [address] [place], [place]  [address] Phone:  [phone] Fax:  [phone] 03/01/23, 2:26 PM

## 2023-03-08 ENCOUNTER — Ambulatory Visit: Payer: Medicare Other | Admitting: Speech Pathology

## 2023-03-08 ENCOUNTER — Encounter: Payer: Self-pay | Admitting: Speech Pathology

## 2023-03-08 DIAGNOSIS — R2681 Unsteadiness on feet: Secondary | ICD-10-CM | POA: Diagnosis not present

## 2023-03-08 DIAGNOSIS — R41842 Visuospatial deficit: Secondary | ICD-10-CM | POA: Diagnosis not present

## 2023-03-08 DIAGNOSIS — R2689 Other abnormalities of gait and mobility: Secondary | ICD-10-CM | POA: Diagnosis not present

## 2023-03-08 DIAGNOSIS — R29818 Other symptoms and signs involving the nervous system: Secondary | ICD-10-CM | POA: Diagnosis not present

## 2023-03-08 DIAGNOSIS — R278 Other lack of coordination: Secondary | ICD-10-CM | POA: Diagnosis not present

## 2023-03-08 DIAGNOSIS — I69352 Hemiplegia and hemiparesis following cerebral infarction affecting left dominant side: Secondary | ICD-10-CM | POA: Diagnosis not present

## 2023-03-08 DIAGNOSIS — R4701 Aphasia: Secondary | ICD-10-CM | POA: Diagnosis not present

## 2023-03-08 DIAGNOSIS — R41841 Cognitive communication deficit: Secondary | ICD-10-CM | POA: Diagnosis not present

## 2023-03-08 DIAGNOSIS — I6319 Cerebral infarction due to embolism of other precerebral artery: Secondary | ICD-10-CM | POA: Diagnosis not present

## 2023-03-08 DIAGNOSIS — M6281 Muscle weakness (generalized): Secondary | ICD-10-CM | POA: Diagnosis not present
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
## 2023-03-13 ENCOUNTER — Ambulatory Visit: Payer: Medicare Other | Admitting: Speech Pathology

## 2023-03-13 ENCOUNTER — Encounter: Payer: Self-pay | Admitting: Speech Pathology

## 2023-03-13 DIAGNOSIS — R41841 Cognitive communication deficit: Secondary | ICD-10-CM | POA: Diagnosis not present

## 2023-03-13 DIAGNOSIS — I69352 Hemiplegia and hemiparesis following cerebral infarction affecting left dominant side: Secondary | ICD-10-CM | POA: Diagnosis not present

## 2023-03-13 DIAGNOSIS — R2689 Other abnormalities of gait and mobility: Secondary | ICD-10-CM | POA: Diagnosis not present

## 2023-03-13 DIAGNOSIS — R4701 Aphasia: Secondary | ICD-10-CM

## 2023-03-13 DIAGNOSIS — I6319 Cerebral infarction due to embolism of other precerebral artery: Secondary | ICD-10-CM | POA: Diagnosis not present

## 2023-03-13 DIAGNOSIS — R41842 Visuospatial deficit: Secondary | ICD-10-CM | POA: Diagnosis not present

## 2023-03-13 DIAGNOSIS — R29818 Other symptoms and signs involving the nervous system: Secondary | ICD-10-CM | POA: Diagnosis not present

## 2023-03-13 DIAGNOSIS — R2681 Unsteadiness on feet: Secondary | ICD-10-CM | POA: Diagnosis not present

## 2023-03-13 DIAGNOSIS — M6281 Muscle weakness (generalized): Secondary | ICD-10-CM | POA: Diagnosis not present

## 2023-03-13 DIAGNOSIS — R278 Other lack of coordination: Secondary | ICD-10-CM | POA: Diagnosis not present

## 2023-03-13 NOTE — Therapy (Signed)
OUTPATIENT SPEECH LANGUAGE PATHOLOGY TREATMENT NOTE   Patient Name: James Estrada MRN: [id number] DOB:[birth year], 82 y.o., male Today's Date: 03/13/2023  PCP: Renford Dills, MD REFERRING PROVIDER: Renford Dills, MD  END OF SESSION:   End of Session - 03/13/23 1418     Visit Number 18    Number of Visits 25    Date for SLP Re-Evaluation 03/20/23    Authorization Type Medicare    SLP Start Time 1400    SLP Stop Time  1445    SLP Time Calculation (min) 45 min    Activity Tolerance Patient tolerated treatment well                Past Medical History:  Diagnosis Date   AAA (abdominal aortic aneurysm) (HCC)    last u/s done 07/18/17    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) 05/22/2022   Arthritis    Bradycardia    CVA (cerebral vascular accident) (HCC) 10/04/2022   Dysrhythmia    frequent PAC, for 20 years   GERD (gastroesophageal reflux disease)    occ, OTC   Hemorrhoids    History of hiatal hernia    Hyperlipidemia    Hypertension    S/P aortic dissection repair 09/18/2022   Seasonal allergies    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 OR;  Service: Vascular;  Laterality: N/A;   CATARACT EXTRACTION Bilateral 2009   DIAGNOSTIC LAPAROSCOPY     laparoscopic hernia repair   EYE SURGERY Bilateral    cataract removal   HERNIA REPAIR Bilateral 1999, 2006   JOINT REPLACEMENT Right ~2018   hip replacement   pheochromocytoma  1993   PROSTATECTOMY N/A 05/15/2013   Procedure: PROSTATECTOMY RETROPUBIC; SIMPLE OPEN PROSTATECTOMY;  Surgeon: Valetta Fuller, MD;  Location: [facility] ORS;  Service: Urology;  Laterality: N/A;   REPAIR OF ACUTE ASCENDING THORACIC AORTIC DISSECTION N/A 09/17/2022   Procedure: REPAIR OF ACUTE ASCENDING THORACIC AORTIC  DISSECTION USING 28 MM HEMASHIELD PLATINUM VASCULAR GRAFT;  Surgeon: Loreli Slot, MD;  Location: [facility] OR;  Service: Vascular;  Laterality: N/A;  Median sternotomy   TOTAL ELBOW REPLACEMENT Left    > 30 years ago   TOTAL HIP ARTHROPLASTY Right 09/08/2017   Procedure: RIGHT TOTAL HIP ARTHROPLASTY ANTERIOR APPROACH;  Surgeon: Kathryne Hitch, MD;  Location: [facility] ORS;  Service: Orthopedics;  Laterality: Right;   ULTRASOUND GUIDANCE FOR VASCULAR ACCESS Bilateral 05/04/2020   Procedure: ULTRASOUND GUIDANCE FOR VASCULAR ACCESS;  Surgeon: Larina Earthly, MD;  Location: [facility] OR;  Service: Vascular;  Laterality: Bilateral;   Patient Active Problem List   Diagnosis Date Noted   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) 10/26/2022   CVA (cerebral vascular accident) (HCC) 10/04/2022   Sepsis with acute renal failure without septic shock (HCC) 09/30/2022   S/P aortic dissection repair 09/18/2022   Status post surgery 09/17/2022   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) 05/22/2022   Colon cancer screening 05/22/2022   Essential hypertension 05/22/2022   Hemorrhoids 05/22/2022   Inguinal hernia 05/22/2022   Irritable bowel syndrome 05/22/2022   Mixed hyperlipidemia 05/22/2022   Pure hypercholesterolemia 05/22/2022   Thrombocytopenia (HCC) 05/22/2022   Allergic rhinitis 05/22/2022   AAA (abdominal aortic aneurysm) without rupture (HCC) 05/04/2020   Trochanteric bursitis,  right hip 08/20/2018   History of right hip replacement 08/20/2018   Status post total replacement of right hip 09/08/2017   Pain of right hip joint 08/07/2017   Unilateral primary osteoarthritis, right hip 08/07/2017   BPH (benign prostatic hypertrophy) with urinary obstruction 05/15/2013    ONSET DATE: 09/17/22  REFERRING DIAG: R13.10 (ICD-10-CM) - Dysphagia, unspecified  THERAPY DIAG:  Aphasia  Rationale for Evaluation and Treatment: Rehabilitation  SUBJECTIVE:   SUBJECTIVE STATEMENT:"We moved into  temporary housing at [facility] PAIN:  Are you having pain? No   OBJECTIVE:  Pt is a 82 y/o M admitted to [facility] on 09/17/22 with type 1 aortic dissection requiring emergent repair. Head CT 11/6 acute infarcts within R MCA involvement, including R cerebellum, basal ganglia, frontal lobe. ETT 11/4-11/9. PMH AAA s/p endovascular repair (2020), HTN, HLD, GERD.   TODAY'S TREATMENT:                                                                                                                                         DATE:   03/13/23: HW not complete due to moving and care accident.  To target written expression, word finding, complex sentence generation  Scientist, product/process development ([employer]Ne[employer]) was utilized. The pt generated 3 subjects and objects for 3 verbs (lock. Analyze, peel), for a total of 9 subject objects. Pt required usual semantic or questioning cues to generated 3rd subject/object. He generated 2 with rare min A and extended time. Pt generated 3 complex sentences by answering "wh" questions. Pt required occasional min cues to generate complex sentences and occasional mod written or verbal cues to ID and correct written aphasic errors. They are having dinner with a neighbor at [facility] whom they knew a while back. James Estrada required verbal cues to verbalize their first names and last names , however he did verbalize 3 facts about the couple with questioning cues. I demonstrated use of written cues to review their names, facts and any conversation topics that can be predicted. Educated that this should be used prior to social events to help James Estrada for more confident communicating in social groups   03/06/23: Case returns [facility] - 95% accurate cohesive persuasive written paragraph re: his opinion of means testing for social security - 2 spelling errors,   6 sentneces. To target word finding, written expression, complex sentence generation  Scientist, product/process development (VNe[employer]) was utilized.  The pt generated 3 subjects and objects for 3 verbs (sell, wipe, carve), for a total of 9 subject objects. Pt required occasional min semantic cues. Pt generated 3 complex sentences by answering "wh" questions. Pt required rare min cues to generate complex sentences. Reinaldo wrote subject-verb objects and complex sentences with 2-3 spelling errors per complex sentence. Mod A to ID and correct errors   03/01/23: Mariah reports frustration with written expression writing paragraph level persuation. HW of 4-5 sentence persuasive paragraph  with 6 aphasic errors which required mod verbal cues to corrrect. Targeted pursuasive verbal expression and reading comprehension reading and generating verbal summary and opinion of 2 articles. In persuasive narrative (college athletes being paid, AI affecting jobs and businesses) [name] required occasional mod questioning cues/requests for clarification to verbalize 3 pros and cons for each topic in cohesive manner. He did verbalize 3 reasons for his opinion for each topic. HW - written 4-5 sentence paragraph re: his argument for or against means testing for social security.  02/22/23: Kobie continues to send and respond to family texts with success. Targeted verbal sequencing and written expression generating 4 step sequencing ADL's and IADL's - Vaughan sequenced the steps with occasional min A. Written expression at phrase level Tobey self checked for errors - no errors over 12 phrases. Collaborated with [name] and Kathie Rhodes to write new goals - goals 6-8 added today. Keatin met previous LTG's.  02/20/23: Pt completed PROM (communicative participation item bank - general short form). Endorsed most difficulty in fast-paced conversations and giving detailed information.. He has been texting with friends and family using previously discussed strategies, such as preparing his message in advance. Encouraged pt to practicing speaking over the phone. Score 14/30, improved by 5 points  since eval  In structured writing task targeting anomia, pt demonstrated difficulty writing target sentences. Pt generated 5 written sentences and included 2 target words in each sentence, given usual mod-A to correct mistakes. HEP: Practice writing to prompt SLP provided via email  02/15/23: Targeted word finding, compensations for aphasia and cohesive discourse at conversation level. [name] required usual min to mod verbal and questioning cues to use verbal compensations for anomic episodes. He relayed 2 family stories with occasional min questioning cues for sequence and to provide salient details. He verbalized 3 compensatory strategies for aphasia when making phone calls and providing his address and email address over the phone. (Write it down, practice script, self advocate) Written expression at phrase level required usual mod A to ID and correct errors 12/15x  02/13/23: Pt reported difficulty recalling his home and email address over the phone (when making reservations) and relying on his wife to provide it. Also reported difficulty speaking over the phone with his friends.  Reported sometimes when he order food at a restaurant, he'll say the wrong word and he has become better about correcting himself.  Pt wanted to work on planning out his communication with his PCP later this week. SLP discussed importance of writing information down to support his memory and to plan his communication (e.g email address).  Targeted word-finding (spoken and written language) during structured divergent naming task (scattergories). Given occasional min to rare mod- A (semantic, gestural, phonemic cues), pt successfully generated complex, high level words for 10/10 categories. HEP: Practice speaking over phone and providing email address and spelling it.        PATIENT EDUCATION: Education details: See patient instructions, see today's treatment, compensations for aphasia Person educated: Patient and  Spouse Education method: Explanation, Verbal cues, and Handouts Education comprehension: verbal cues required and needs further education  GOALS: Goals reviewed with patient? Yes   SHORT TERM GOALS: Target date: 01/23/23   Pt will name 10 items in personally relevant categories over 3 sessions with occasional min A Baseline: Goal status: MET   2.  Pt will write 4 item grocery list with occasional min A Baseline:  Goal status: MET   3.  Pt will respond to 3 texts with occasional min A  over 1 week Baseline:  Goal status: MET   4.  Pt will generate complex verbal sentences 3x using Scientist, product/process development with rare min A over 2 sessions Baseline:  Goal status: MET   5.  Pt will comprehend 4 sentence passage/email with extended time and rare min A Baseline:  Goal status: MET   6.  Pt will employ compensations for aphasia in structured language tasks with occasional min A Baseline:  Goal status: MET   LONG TERM GOALS: Target date: 03/20/23   Pt will complete complex naming tasks with 80% accuracy and occasional min A Baseline:  Goal status: MET   2.  Pt will carryover verbal compensations for aphasia with occasional min A in conversation as needed over 2 sessions Baseline:  Goal status: MET   3.  Pt will carryover 3 strategies to successfully communicate in community with clerks, servers etc Baseline:  Goal status: MET   4.  Pt will write 3 sentence text or email with occasional min A, external aids and occasional min A  Baseline:  Goal status: MET   5.  Pt will improve score on Communicative Participation Item Bank by 4 points Baseline: 14/30 Goal status: MET  6. Pt will comprehend 3-5 paragraph story or article by accurately summarizing salient details in cohesive summary with rare min A             Goal Status: INITIAL (added 02/22/23)  7. Pt will generate 5-7 sentence paragraph or email, Iding and correcting errors with occasional min A               Goal Status: INITIAL (added 02/22/23)  8. Pt will generate detailed, cohesive summary of a movie, news, article show or story requiring 3 or less requests for clarification               Goal Status: INITIAL (added 02/22/23)  9. Pt will carryover 2 compensations to make 3 phone calls to friends  or businesses with rare min A               Goal Status: INITIAL (added 02/22/23)    ASSESSMENT:   CLINICAL IMPRESSION:  Targeted writing and anomia this session in structured language tasks. Pt continues to make excellent progress and reports actively completing his HEP at home. Verbal expression for simple conversation is with mod I and mildly complex conversation with rare min A and occasional halting for word finding. Written expression at sentence level is accurate with rare min Aand paragraph level with occasional aphasic errors, occasional mod A to ID and correct errors. Reading comprehension intact now at sentence and simple paragraph level. Ongoing extended time and occasional min to mod A for moderately complex paragraphs and short articles. Pt continues to benefit from ST intervention to maximize his communication and improve QoL at home and in the community.    OBJECTIVE IMPAIRMENTS: include memory and aphasia. These impairments are limiting patient from managing medications, managing appointments, managing finances, household responsibilities, ADLs/IADLs, and effectively communicating at home and in community. Factors affecting potential to achieve goals and functional outcome are ability to learn/carryover information. Patient will benefit from skilled SLP services to address above impairments and improve overall function.   REHAB POTENTIAL: Good   PLAN:   SLP FREQUENCY: 2x/week   SLP DURATION: 12 weeks   PLANNED INTERVENTIONS: Language facilitation, Environmental controls, Cueing hierachy, Cognitive reorganization, Internal/external aids, Functional tasks, and Multimodal communication  approach     Nielle Duford, Vernona Rieger  Ann, CCC-SLP 03/13/2023, 3:16 PM

## 2023-03-13 NOTE — Patient Instructions (Signed)
   It can be a good idea to review names, facts, relationships, family news etc writing down key names and words before social events  This can be a lot of people, such as a wedding or reunion   As you meet neighbors, a list of names and unit #'s or where they live can also be helpful to review before lunch or dinner in the dining room

## 2023-03-15 ENCOUNTER — Ambulatory Visit: Payer: Medicare Other | Attending: Internal Medicine | Admitting: Speech Pathology

## 2023-03-15 ENCOUNTER — Encounter: Payer: Medicare Other | Admitting: Speech Pathology

## 2023-03-15 ENCOUNTER — Encounter: Payer: Self-pay | Admitting: Speech Pathology

## 2023-03-15 DIAGNOSIS — L570 Actinic keratosis: Secondary | ICD-10-CM | POA: Diagnosis not present

## 2023-03-15 DIAGNOSIS — R41841 Cognitive communication deficit: Secondary | ICD-10-CM | POA: Diagnosis not present

## 2023-03-15 DIAGNOSIS — L812 Freckles: Secondary | ICD-10-CM | POA: Diagnosis not present

## 2023-03-15 DIAGNOSIS — D225 Melanocytic nevi of trunk: Secondary | ICD-10-CM | POA: Diagnosis not present

## 2023-03-15 DIAGNOSIS — L820 Inflamed seborrheic keratosis: Secondary | ICD-10-CM | POA: Diagnosis not present

## 2023-03-15 DIAGNOSIS — R4701 Aphasia: Secondary | ICD-10-CM | POA: Diagnosis not present

## 2023-03-15 DIAGNOSIS — L821 Other seborrheic keratosis: Secondary | ICD-10-CM | POA: Diagnosis not present

## 2023-03-15 NOTE — Therapy (Signed)
OUTPATIENT SPEECH LANGUAGE PATHOLOGY TREATMENT NOTE   Patient Name: James Estrada MRN: [id number] DOB:[birth year], 82 y.o., male Today's Date: 03/15/2023  PCP: Renford Dills, MD REFERRING PROVIDER: Renford Dills, MD  END OF SESSION:   End of Session - 03/15/23 1109     Visit Number 19    Number of Visits 25    Date for SLP Re-Evaluation 03/20/23    Authorization Type Medicare    SLP Start Time 1100    SLP Stop Time  1145    SLP Time Calculation (min) 45 min    Activity Tolerance Patient tolerated treatment well                Past Medical History:  Diagnosis Date   AAA (abdominal aortic aneurysm) (HCC)    last u/s done 07/18/17    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) 05/22/2022   Arthritis    Bradycardia    CVA (cerebral vascular accident) (HCC) 10/04/2022   Dysrhythmia    frequent PAC, for 20 years   GERD (gastroesophageal reflux disease)    occ, OTC   Hemorrhoids    History of hiatal hernia    Hyperlipidemia    Hypertension    S/P aortic dissection repair 09/18/2022   Seasonal allergies    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) 10/26/2022   Past Surgical History:  Procedure Laterality Date   ABDOMINAL AORTIC ENDOVASCULAR STENT GRAFT N/A 05/04/2020   Procedure: ABDOMINAL AORTIC ENDOVASCULAR STENT GRAFT;  Surgeon: Larina Earthly, MD;  Location: [facility] OR;  Service: Vascular;  Laterality: N/A;   CATARACT EXTRACTION Bilateral 2009   DIAGNOSTIC LAPAROSCOPY     laparoscopic hernia repair   EYE SURGERY Bilateral    cataract removal   HERNIA REPAIR Bilateral 1999, 2006   JOINT REPLACEMENT Right ~2018   hip replacement   pheochromocytoma  1993   PROSTATECTOMY N/A 05/15/2013   Procedure: PROSTATECTOMY RETROPUBIC; SIMPLE OPEN PROSTATECTOMY;  Surgeon: Valetta Fuller, MD;  Location: [facility] ORS;  Service: Urology;  Laterality: N/A;   REPAIR OF ACUTE ASCENDING THORACIC AORTIC DISSECTION N/A 09/17/2022   Procedure: REPAIR OF ACUTE ASCENDING THORACIC AORTIC  DISSECTION USING 28 MM HEMASHIELD PLATINUM VASCULAR GRAFT;  Surgeon: Loreli Slot, MD;  Location: [facility] OR;  Service: Vascular;  Laterality: N/A;  Median sternotomy   TOTAL ELBOW REPLACEMENT Left    > 30 years ago   TOTAL HIP ARTHROPLASTY Right 09/08/2017   Procedure: RIGHT TOTAL HIP ARTHROPLASTY ANTERIOR APPROACH;  Surgeon: Kathryne Hitch, MD;  Location: [facility] ORS;  Service: Orthopedics;  Laterality: Right;   ULTRASOUND GUIDANCE FOR VASCULAR ACCESS Bilateral 05/04/2020   Procedure: ULTRASOUND GUIDANCE FOR VASCULAR ACCESS;  Surgeon: Larina Earthly, MD;  Location: [facility] OR;  Service: Vascular;  Laterality: Bilateral;   Patient Active Problem List   Diagnosis Date Noted   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) 10/26/2022   CVA (cerebral vascular accident) (HCC) 10/04/2022   Sepsis with acute renal failure without septic shock (HCC) 09/30/2022   S/P aortic dissection repair 09/18/2022   Status post surgery 09/17/2022   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) 05/22/2022   Colon cancer screening 05/22/2022   Essential hypertension 05/22/2022   Hemorrhoids 05/22/2022   Inguinal hernia 05/22/2022   Irritable bowel syndrome 05/22/2022   Mixed hyperlipidemia 05/22/2022   Pure hypercholesterolemia 05/22/2022   Thrombocytopenia (HCC) 05/22/2022   Allergic rhinitis 05/22/2022   AAA (abdominal aortic aneurysm) without rupture (HCC) 05/04/2020   Trochanteric bursitis,  right hip 08/20/2018   History of right hip replacement 08/20/2018   Status post total replacement of right hip 09/08/2017   Pain of right hip joint 08/07/2017   Unilateral primary osteoarthritis, right hip 08/07/2017   BPH (benign prostatic hypertrophy) with urinary obstruction 05/15/2013    ONSET DATE: 09/17/22  REFERRING DIAG: R13.10 (ICD-10-CM) - Dysphagia, unspecified  THERAPY DIAG:  Aphasia  Rationale for Evaluation and Treatment: Rehabilitation  SUBJECTIVE:   SUBJECTIVE STATEMENT:"We moved into  temporary housing at [facility]" PAIN:  Are you having pain? No   OBJECTIVE:  Pt is a 82 y/o M admitted to [facility] on 09/17/22 with type 1 aortic dissection requiring emergent repair. Head CT 11/6 acute infarcts within R MCA involvement, including R cerebellum, basal ganglia, frontal lobe. ETT 11/4-11/9. PMH AAA s/p endovascular repair (2020), HTN, HLD, GERD.   TODAY'S TREATMENT:                                                                                                                                         DATE:   03/15/23: Jachin returns with [facility] - written expression as sentence level completed with correct spelling. Targeted high level complex conversation re: [place] protests on college campuses and the use of police. Charlis required occasional min questioning cues and occasional extended time for word finding. He successfully verbalized his opinions and solutions with rare min A. Reading comprehension at multi paragraph simple article with consistent extended time. Masaichi endorses reading is slower and more difficult than prior to the CVA. He required frequent mod to max verbal cues and questioning cues to ID 3 salient details re: the article. He also notes slower processing and difficulty focusing when reading. He has forgotten his glasses the past few sessions, Kathie Rhodes endorses his "forgetfulness" and lack of focus as well. Next session, consider informal cognitive assessment/adding cognitive goals.   03/13/23: HW not complete due to moving and care accident.  To target written expression, word finding, complex sentence generation  Scientist, product/process development ([employer]) was utilized. The pt generated 3 subjects and objects for 3 verbs (lock. Analyze, peel), for a total of 9 subject objects. Pt required usual semantic or questioning cues to generated 3rd subject/object. He generated 2 with rare min A and extended time. Pt generated 3 complex sentences by answering "wh" questions. Pt  required occasional min cues to generate complex sentences and occasional mod written or verbal cues to ID and correct written aphasic errors. They are having dinner with a neighbor at [facility] whom they knew a while back. Malique required verbal cues to verbalize their first names and last names , however he did verbalize 3 facts about the couple with questioning cues. I demonstrated use of written cues to review their names, facts and any conversation topics that can be predicted. Educated that this should be used prior to social events to help [place] for  more confident communicating in social groups   03/06/23: Travers returns [facility] - 95% accurate cohesive persuasive written paragraph re: his opinion of means testing for social security - 2 spelling errors,   6 sentneces. To target word finding, written expression, complex sentence generation  Scientist, product/process development ([employer]Ne[employer]) was utilized. The pt generated 3 subjects and objects for 3 verbs (sell, wipe, carve), for a total of 9 subject objects. Pt required occasional min semantic cues. Pt generated 3 complex sentences by answering "wh" questions. Pt required rare min cues to generate complex sentences. Leny wrote subject-verb objects and complex sentences with 2-3 spelling errors per complex sentence. Mod A to ID and correct errors   03/01/23: Blayton reports frustration with written expression writing paragraph level persuation. HW of 4-5 sentence persuasive paragraph with 6 aphasic errors which required mod verbal cues to corrrect. Targeted pursuasive verbal expression and reading comprehension reading and generating verbal summary and opinion of 2 articles. In persuasive narrative (college athletes being paid, AI affecting jobs and businesses) [name] required occasional mod questioning cues/requests for clarification to verbalize 3 pros and cons for each topic in cohesive manner. He did verbalize 3 reasons for his opinion for each topic. HW -  written 4-5 sentence paragraph re: his argument for or against means testing for social security.  02/22/23: Kashmir continues to send and respond to family texts with success. Targeted verbal sequencing and written expression generating 4 step sequencing ADL's and IADL's - Zekiah sequenced the steps with occasional min A. Written expression at phrase level Javoni self checked for errors - no errors over 12 phrases. Collaborated with [name] and Kathie Rhodes to write new goals - goals 6-8 added today. Keion met previous LTG's.  02/20/23: Pt completed PROM (communicative participation item bank - general short form). Endorsed most difficulty in fast-paced conversations and giving detailed information.. He has been texting with friends and family using previously discussed strategies, such as preparing his message in advance. Encouraged pt to practicing speaking over the phone. Score 14/30, improved by 5 points since eval  In structured writing task targeting anomia, pt demonstrated difficulty writing target sentences. Pt generated 5 written sentences and included 2 target words in each sentence, given usual mod-A to correct mistakes. HEP: Practice writing to prompt SLP provided via email  02/15/23: Targeted word finding, compensations for aphasia and cohesive discourse at conversation level. [name] required usual min to mod verbal and questioning cues to use verbal compensations for anomic episodes. He relayed 2 family stories with occasional min questioning cues for sequence and to provide salient details. He verbalized 3 compensatory strategies for aphasia when making phone calls and providing his address and email address over the phone. (Write it down, practice script, self advocate) Written expression at phrase level required usual mod A to ID and correct errors 12/15x      PATIENT EDUCATION: Education details: See patient instructions, see today's treatment, compensations for aphasia Person educated: Patient  and Spouse Education method: Explanation, Verbal cues, and Handouts Education comprehension: verbal cues required and needs further education  GOALS: Goals reviewed with patient? Yes   SHORT TERM GOALS: Target date: 01/23/23   Pt will name 10 items in personally relevant categories over 3 sessions with occasional min A Baseline: Goal status: MET   2.  Pt will write 4 item grocery list with occasional min A Baseline:  Goal status: MET   3.  Pt will respond to 3 texts with occasional min A over 1 week Baseline:  Goal status: MET   4.  Pt will generate complex verbal sentences 3x using Scientist, product/process development with rare min A over 2 sessions Baseline:  Goal status: MET   5.  Pt will comprehend 4 sentence passage/email with extended time and rare min A Baseline:  Goal status: MET   6.  Pt will employ compensations for aphasia in structured language tasks with occasional min A Baseline:  Goal status: MET   LONG TERM GOALS: Target date: 03/20/23   Pt will complete complex naming tasks with 80% accuracy and occasional min A Baseline:  Goal status: MET   2.  Pt will carryover verbal compensations for aphasia with occasional min A in conversation as needed over 2 sessions Baseline:  Goal status: MET   3.  Pt will carryover 3 strategies to successfully communicate in community with clerks, servers etc Baseline:  Goal status: MET   4.  Pt will write 3 sentence text or email with occasional min A, external aids and occasional min A  Baseline:  Goal status: MET   5.  Pt will improve score on Communicative Participation Item Bank by 4 points Baseline: 14/30 Goal status: MET  6. Pt will comprehend 3-5 paragraph story or article by accurately summarizing salient details in cohesive summary with rare min A             Goal Status: INITIAL (added 02/22/23)  7. Pt will generate 5-7 sentence paragraph or email, Iding and correcting errors with occasional min A               Goal Status: INITIAL (added 02/22/23)  8. Pt will generate detailed, cohesive summary of a movie, news, article show or story requiring 3 or less requests for clarification               Goal Status: INITIAL (added 02/22/23)  9. Pt will carryover 2 compensations to make 3 phone calls to friends  or businesses with rare min A               Goal Status: INITIAL (added 02/22/23)    ASSESSMENT:   CLINICAL IMPRESSION:  Targeted writing and anomia this session in structured language tasks. Pt continues to make excellent progress and reports actively completing his HEP at home. Verbal expression for simple conversation is with mod I and mildly complex conversation with rare min A and occasional halting for word finding. Written expression at sentence level is accurate with rare min Aand paragraph level with occasional aphasic errors, occasional mod A to ID and correct errors. Reading comprehension intact now at sentence and simple paragraph level. Ongoing extended time and occasional min to mod A for moderately complex paragraphs and short articles. Pt continues to benefit from ST intervention to maximize his communication and improve QoL at home and in the community.    OBJECTIVE IMPAIRMENTS: include memory and aphasia. These impairments are limiting patient from managing medications, managing appointments, managing finances, household responsibilities, ADLs/IADLs, and effectively communicating at home and in community. Factors affecting potential to achieve goals and functional outcome are ability to learn/carryover information. Patient will benefit from skilled SLP services to address above impairments and improve overall function.   REHAB POTENTIAL: Good   PLAN:   SLP FREQUENCY: 2x/week   SLP DURATION: 12 weeks   PLANNED INTERVENTIONS: Language facilitation, Environmental controls, Cueing hierachy, Cognitive reorganization, Internal/external aids, Functional tasks, and Multimodal communication  approach     Almas Rake, Radene Journey, CCC-SLP 03/15/2023, 12:15 PM

## 2023-03-20 ENCOUNTER — Ambulatory Visit: Payer: Medicare Other | Admitting: Speech Pathology

## 2023-03-20 ENCOUNTER — Encounter: Payer: Self-pay | Admitting: Occupational Therapy

## 2023-03-20 ENCOUNTER — Ambulatory Visit: Payer: Medicare Other | Admitting: Occupational Therapy

## 2023-03-20 ENCOUNTER — Encounter: Payer: Self-pay | Admitting: Speech Pathology

## 2023-03-20 DIAGNOSIS — R41841 Cognitive communication deficit: Secondary | ICD-10-CM | POA: Diagnosis not present

## 2023-03-20 DIAGNOSIS — R4701 Aphasia: Secondary | ICD-10-CM
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
## 2023-03-22 ENCOUNTER — Encounter: Payer: Self-pay | Admitting: Speech Pathology

## 2023-03-22 ENCOUNTER — Ambulatory Visit: Payer: Medicare Other | Admitting: Occupational Therapy

## 2023-03-22 ENCOUNTER — Ambulatory Visit: Payer: Medicare Other | Admitting: Speech Pathology

## 2023-03-22 DIAGNOSIS — R4701 Aphasia: Secondary | ICD-10-CM

## 2023-03-22 DIAGNOSIS — R41841 Cognitive communication deficit: Secondary | ICD-10-CM
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
## 2023-03-24 NOTE — Therapy (Signed)
OCCUPATIONAL THERAPY DISCHARGE SUMMARY  Pt requesting d/c from OT services. Recommend he continue with HEP and adaptive strategies for more safe and independent ADL and IADL completion.

## 2023-03-27 ENCOUNTER — Encounter: Payer: Self-pay | Admitting: Speech Pathology

## 2023-03-27 ENCOUNTER — Encounter: Payer: Self-pay | Admitting: Neurology

## 2023-03-27 ENCOUNTER — Ambulatory Visit: Payer: Medicare Other | Admitting: Neurology

## 2023-03-27 ENCOUNTER — Ambulatory Visit: Payer: Medicare Other | Admitting: Speech Pathology

## 2023-03-27 VITALS — BP 118/72 | HR 64 | Ht 72.0 in | Wt 179.0 lb

## 2023-03-27 DIAGNOSIS — G3184 Mild cognitive impairment, so stated: Secondary | ICD-10-CM

## 2023-03-27 DIAGNOSIS — R41841 Cognitive communication deficit: Secondary | ICD-10-CM

## 2023-03-27 DIAGNOSIS — R4701 Aphasia: Secondary | ICD-10-CM | POA: Diagnosis not present

## 2023-03-27 DIAGNOSIS — Z8673 Personal history of transient ischemic attack (TIA), and cerebral infarction without residual deficits: Secondary | ICD-10-CM

## 2023-03-27 NOTE — Progress Notes (Signed)
[facility] [address] [place]. [place] [address] [phone]       OFFICE FOLLOW-UP NOTE  Mr. DETERRIUS HEINS Date of Birth:  Apr 03, 1941 Medical Record Number:  [id number]   HPI: Initial visit 11/22/2022 :Mr.Hannuam is a pleasant 82 year old Caucasian male seen today for initial office follow-up visit following hospital consultation for stroke in November 2023.  He is accompanied by his wife and daughter and history is obtained from them and review of electronic medical records.  I have also personally reviewed pertinent available imaging films in PACS. LAFREDRICK ISLAS is a 82 y.o. male with a past medical history AAA s/p EVAR with a known internal iliac artery aneurysm, arthritis, bradycardia, dysrhythmia, GERD, hemorrhoids, HLD, and HTN presenting from home via EMS for acute onset of severe BLE weakness. He was ambulating and had sudden onset of the BLE weakness resulting in a fall. Triage RN exam did an NIHSS and only deficits were leg weakness, left worse than right. Around 1550 he was outside talking to neighbors when he felt acutely weak in his BLE and lightheaded, in conjunction with acute onset of mild left neck pain and some left flank pain. He fell due to his legs collapsing under him and had to drag himself on the ground to move when he tried to get to the phone. He did make it up the steps and was able to get to the phone in his bedroom to call EMS. He was brought in to the ED where a Code Stroke was called. Since his arrival to the ED and while in CT his left flank pain has steadily worsened and his blood pressures have steadily dropped.  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  He also had evidence of retroperitoneal bleed.  He underwent emergent repair of type I aortic dissection by Dr. Dorris Fetch.  He was able to move all extremities and was following simple commands by the morning  of the first postoperative day however unfortunately later he was not able to follow commands equally on the right and the left side of his upper extremities.  Neurology was consulted.  MRI scan of the brain was obtained which showed numerous bilateral acute infarcts right greater than left involving MCA and PCA territories as well as cerebellum.  He was found to have transient atrial fibrillation which was treated with amiodarone drip and he converted to normal sinus rhythm soon.  EEG showed no seizure activity.  CT angiogram showed partially visualized aortic dissection into the right brachiocephalic and also into the origin of the right common carotid artery.  CT angiogram of the brain showed mild intracranial atherosclerotic changes.  Echocardiogram showed ejection fraction of 60 to 65%.  LDL cholesterol was 42 mg percent.  Hemoglobin A1c was 5.5.  Patient on examination he has significant left hemiplegia.  He was transferred to inpatient rehab and showed gradual improvement and discharged home.  He is doing well and feels he is made almost a full recovery.  He still has some diminished fine motor skills in the left side.  He is ambulating with a cane and occasionally feels off balance. ' His balance is still not good and he stumbles and fell once a few days ago  l.  His main deficit to be cognitive slowing.  He has trouble finding words and has to think about answers.  Short-term memory is also poor.  Physical and  Occupational Therapy still getting some therapy for cognition. Update 03/27/2023 : He returns for follow-up after last visit 4 months ago.  He is accompanied by his wife.  He continues to have mild memory and balance difficulties but these are unchanged.  Patient has not been participating in any mentally challenging activities on a regular basis.  He needs some help with his medications.  But otherwise is pretty independent.  He is currently participating in outpatient speech therapy which seems to  be helping him.  He remains on aspirin tolerating well without bruising or bleeding.  Blood pressure is under good control.  No recurrent stroke or TIA symptoms lab work at last visit on 1/9/20204 when Vitamin B12, TSH, homocystine and RPR were all normal.  Done on 11/24/2022 was also normal without any epileptiform activity.  He has no new complaints today. 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) 05/22/2022   Arthritis    Bradycardia    CVA (cerebral vascular accident) (HCC) 10/04/2022   Dysrhythmia    frequent PAC, for 20 years   GERD (gastroesophageal reflux disease)    occ, OTC   Hemorrhoids    History of hiatal hernia    Hyperlipidemia    Hypertension    S/P aortic dissection repair 09/18/2022   Seasonal allergies    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) 10/26/2022    Social History:  Social History   Socioeconomic History   Marital status: Married    Spouse name: Not on file   Number of children: Not on file   Years of education: Not on file   Highest education level: Not on file  Occupational History   Not on file  Tobacco Use   Smoking status: Never   Smokeless tobacco: Never  Vaping Use   Vaping Use: Never used  Substance and Sexual Activity   Alcohol use: Yes    Comment: 2 or 3 drinks on weekends   Drug use: No   Sexual activity: Not on file  Other Topics Concern   Not on file  Social History Narrative   Not on file   Social Determinants of Health   Financial Resource Strain: Not on file  Food Insecurity: No Food Insecurity (09/22/2022)   Hunger Vital Sign    Worried About Running Out of Food in the Last Year: Never true    Ran Out of Food in the Last Year: Never true  Transportation  Needs: No Transportation Needs (09/22/2022)   PRAPARE - Administrator, Civil Service (Medical): No    Lack of Transportation (Non-Medical): No  Physical Activity: Not on file  Stress: Not on file  Social Connections: Not on file  Intimate Partner Violence: Not At Risk (09/22/2022)   Humiliation, Afraid, Rape, and Kick questionnaire    Fear of Current or Ex-Partner: No    Emotionally Abused: No    Physically Abused: No    Sexually Abused: No    Medications:   Current Outpatient Medications on File Prior to Visit  Medication Sig Dispense Refill   acetaminophen (TYLENOL) 325 MG tablet Take 1-2 tablets (325-650 mg total) by mouth every 4 (four) hours as needed for mild pain.  aspirin 81 MG chewable tablet Chew 1 tablet (81 mg total) by mouth daily. 30 tablet 0   atorvastatin (LIPITOR) 40 MG tablet Take 1 tablet (40 mg total) by mouth daily. 30 tablet 0   doxazosin (CARDURA) 1 MG tablet Take 1 tablet (1 mg total) by mouth at bedtime. 30 tablet 0   metoprolol tartrate (LOPRESSOR) 25 MG tablet Take 0.5 tablets (12.5 mg total) by mouth 2 (two) times daily. 15 tablet 0   No current facility-administered medications on file prior to visit.    Allergies:  No Known Allergies  Physical Exam General: well developed, well nourished pleasant elderly Caucasian male, seated, in no evident distress.  He has a indwelling Foley catheter. Head: head normocephalic and atraumatic.  Neck: supple with no carotid or supraclavicular bruits Cardiovascular: regular rate and rhythm, no murmurs Musculoskeletal: no deformity Skin:  no rash/petichiae Vascular:  Normal pulses all extremities Vitals:   03/27/23 0931  BP: 118/72  Pulse: 64   Neurologic Exam Mental Status: Awake and fully alert. Oriented to place and time. Recent and remote memory intact. Attention span, concentration and fund of knowledge appropriate. Mood and affect appropriate.  Diminished recall 1/3.  Able to name only 10  animals which can walk on 4 legs.  Clock drawing 3/4. Cranial Nerves: Fundoscopic exam reveals sharp disc margins. Pupils equal, briskly reactive to light. Extraocular movements full without nystagmus. Visual fields full to confrontation. Hearing mildly diminished bilaterally t. Facial sensation intact. Face, tongue, palate moves normally and symmetrically.  Motor: Normal bulk and tone. Normal strength in all tested extremity muscles except mild weakness of left grip and intrinsic hand muscles.  Orbits right over left upper extremity.  Fine finger movements are diminished on the left.. Sensory.: intact to touch ,pinprick .position and vibratory sensation.  Coordination: Rapid alternating movements normal in all extremities. Finger-to-nose and heel-to-shin performed accurately bilaterally. Gait and Station: Arises from chair without difficulty. Stance is normal. Gait demonstrates normal stride length and mild imbalance drifts to the left while turning..  Not able to heel, toe and tandem walk without difficulty.  Reflexes: 1+ and symmetric. Toes downgoing.   NIHSS  1 Modified Rankin  2   ASSESSMENT: 82 year old Caucasian male with multiple bilateral right greater than left MCA, PCA and cerebellar infarcts following surgery for aortic dissection and surgical repair in November 2023 significant improvement.  Mild residual cognitive impairment and left hemiparesis appears stable.     PLAN:I had a long d/w patient and wife  about his recent embolic strokes, mild cognitive impairment, risk for recurrent stroke/TIAs, personally independently reviewed imaging studies and stroke evaluation results and answered questions.Continue Aspirin  for secondary stroke prevention and maintain strict control of hypertension with blood pressure goal below 130/90, diabetes with hemoglobin A1c goal below 6.5% and lipids with LDL cholesterol goal below 70 mg/dL. I also advised the patient to eat a healthy diet with plenty of  whole grains, cereals, fruits and vegetables, exercise regularly and maintain ideal body weight .I recommend he use his cane at all times and we discussed fall prevention precautions.  I also encouraged him to increase participation in cognitively challenging activities like solving crossword puzzles, playing bridge and sudoku.  We also discussed memory compensation strategies. .  Followup in the future with me in 6 months or call earlier if necessary.   . Greater than 50% of time during this  35 minute visit was spent on counseling,explanation of diagnosis, planning of further management, discussion with patient and  family and coordination of care Delia Heady, MD Note: This document was prepared with digital dictation and possible smart phrase technology. Any transcriptional errors that result from this process are unintentional

## 2023-03-27 NOTE — Patient Instructions (Signed)
:  I had a long d/w patient and wife  about his recent embolic strokes, mild cognitive impairment, risk for recurrent stroke/TIAs, personally independently reviewed imaging studies and stroke evaluation results and answered questions.Continue Aspirin  for secondary stroke prevention and maintain strict control of hypertension with blood pressure goal below 130/90, diabetes with hemoglobin A1c goal below 6.5% and lipids with LDL cholesterol goal below 70 mg/dL. I also advised the patient to eat a healthy diet with plenty of whole grains, cereals, fruits and vegetables, exercise regularly and maintain ideal body weight .I recommend he use his cane at all times and we discussed fall prevention precautions.  I also encouraged him to increase participation in cognitively challenging activities like solving crossword puzzles, playing bridge and sudoku.  We also discussed memory compensation strategies. .  Followup in the future with me in 6 months or call earlier if necessary.  Memory Compensation Strategies  Use "WARM" strategy.  W= write it down  A= associate it  R= repeat it  M= make a mental note  2.   You can keep a Glass blower/designer.  Use a 3-ring notebook with sections for the following: calendar, important names and phone numbers,  medications, doctors' names/phone numbers, lists/reminders, and a section to journal what you did  each day.   3.    Use a calendar to write appointments down.  4.    Write yourself a schedule for the day.  This can be placed on the calendar or in a separate section of the Memory Notebook.  Keeping a  regular schedule can help memory.  5.    Use medication organizer with sections for each day or morning/evening pills.  You may need help loading it  6.    Keep a basket, or pegboard by the door.  Place items that you need to take out with you in the basket or on the pegboard.  You may also want to  include a message board for reminders.  7.    Use sticky notes.  Place  sticky notes with reminders in a place where the task is performed.  For example: " turn off the  stove" placed by the stove, "lock the door" placed on the door at eye level, " take your medications" on  the bathroom mirror or by the place where you normally take your medications.  8.    Use alarms/timers.  Use while cooking to remind yourself to check on food or as a reminder to take your medicine, or as a  reminder to make a call, or as a reminder to perform another task, etc.

## 2023-03-27 NOTE — Patient Instructions (Addendum)
   Checkers Chief [employer] Officer 4 [employer] games Jig saw puzzles Easy cross words Memory match Board games [employer] Learn a new game!  Listen to and discuss Ted Talks or Podcasts Read and discuss short articles of interest to you- Take notes on these if memory is a challenge Discuss social media posts Look and discuss photo albums  The best activities to improve cognition are functional, real life activities that are important to you:  Plan a menu Participate in household chores and decisions (with supervision) Plan a party, trip or tailgate with all of the details (even if you aren't really going to carry it out) Participate in your hobby as you are able with assistance Manage your texts, emails with supervision if needed. Google search for items (even if you're not really going to buy anything) and compare prices and features   It's good to use real in-person games, not just apps  Apps:  [employer] There are apps for most of the games listed above

## 2023-03-27 NOTE — Therapy (Signed)
OUTPATIENT SPEECH LANGUAGE PATHOLOGY TREATMENT NOTE   Patient Name: James Estrada MRN: [id number] DOB:1941/10/12, 82 y.o., male Today's Date: 03/27/2023  PCP: James Dills, MD REFERRING PROVIDER: Renford Dills, MD  END OF SESSION:   End of Session - 03/27/23 1407     Visit Number 22    Number of Visits 50    Date for SLP Re-Evaluation 06/12/23    Authorization Type Medicare    SLP Start Time 1400    SLP Stop Time  1445    SLP Time Calculation (min) 45 min    Activity Tolerance Patient tolerated treatment well                Past Medical History:  Diagnosis Date   AAA (abdominal aortic aneurysm) (HCC)    last u/s done 07/18/17    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) 05/22/2022   Arthritis    Bradycardia    CVA (cerebral vascular accident) (HCC) 10/04/2022   Dysrhythmia    frequent PAC, for 20 years   GERD (gastroesophageal reflux disease)    occ, OTC   Hemorrhoids    History of hiatal hernia    Hyperlipidemia    Hypertension    S/P aortic dissection repair 09/18/2022   Seasonal allergies    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) 10/26/2022   Past Surgical History:  Procedure Laterality Date   ABDOMINAL AORTIC ENDOVASCULAR STENT GRAFT N/A 05/04/2020   Procedure: ABDOMINAL AORTIC ENDOVASCULAR STENT GRAFT;  Surgeon: James Earthly, MD;  Location: [facility] OR;  Service: Vascular;  Laterality: N/A;   CATARACT EXTRACTION Bilateral 2009   DIAGNOSTIC LAPAROSCOPY     laparoscopic hernia repair   EYE SURGERY Bilateral    cataract removal   HERNIA REPAIR Bilateral 1999, 2006   JOINT REPLACEMENT Right ~2018   hip replacement   pheochromocytoma  1993   PROSTATECTOMY N/A 05/15/2013   Procedure: PROSTATECTOMY RETROPUBIC; SIMPLE OPEN PROSTATECTOMY;  Surgeon: James Fuller, MD;  Location: [facility] ORS;  Service: Urology;  Laterality: N/A;   REPAIR OF ACUTE ASCENDING THORACIC AORTIC DISSECTION N/A 09/17/2022   Procedure: REPAIR OF ACUTE ASCENDING THORACIC AORTIC  DISSECTION USING 28 MM HEMASHIELD PLATINUM VASCULAR GRAFT;  Surgeon: James Slot, MD;  Location: [facility] OR;  Service: Vascular;  Laterality: N/A;  Median sternotomy   TOTAL ELBOW REPLACEMENT Left    > 30 years ago   TOTAL HIP ARTHROPLASTY Right 09/08/2017   Procedure: RIGHT TOTAL HIP ARTHROPLASTY ANTERIOR APPROACH;  Surgeon: James Hitch, MD;  Location: [facility] ORS;  Service: Orthopedics;  Laterality: Right;   ULTRASOUND GUIDANCE FOR VASCULAR ACCESS Bilateral 05/04/2020   Procedure: ULTRASOUND GUIDANCE FOR VASCULAR ACCESS;  Surgeon: James Earthly, MD;  Location: [facility] OR;  Service: Vascular;  Laterality: Bilateral;   Patient Active Problem List   Diagnosis Date Noted   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) 10/26/2022   CVA (cerebral vascular accident) (HCC) 10/04/2022   Sepsis with acute renal failure without septic shock (HCC) 09/30/2022   S/P aortic dissection repair 09/18/2022   Status post surgery 09/17/2022   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) 05/22/2022   Colon cancer screening 05/22/2022   Essential hypertension 05/22/2022   Hemorrhoids 05/22/2022   Inguinal hernia 05/22/2022   Irritable bowel syndrome 05/22/2022   Mixed hyperlipidemia 05/22/2022   Pure hypercholesterolemia 05/22/2022   Thrombocytopenia (HCC) 05/22/2022   Allergic rhinitis 05/22/2022   AAA (abdominal aortic aneurysm) without rupture (HCC) 05/04/2020   Trochanteric bursitis,  right hip 08/20/2018   History of right hip replacement 08/20/2018   Status post total replacement of right hip 09/08/2017   Pain of right hip joint 08/07/2017   Unilateral primary osteoarthritis, right hip 08/07/2017   BPH (benign prostatic hypertrophy) with urinary obstruction 05/15/2013    ONSET DATE: 09/17/22  REFERRING DIAG: R13.10 (ICD-10-CM) - Dysphagia, unspecified  THERAPY DIAG:  Aphasia  Cognitive communication deficit  Rationale for Evaluation and Treatment: Rehabilitation  SUBJECTIVE:    SUBJECTIVE STATEMENT:"We moved into temporary housing at [facility]" PAIN:  Are you having pain? No   OBJECTIVE:  Pt is a 82 y/o M admitted to [facility] on 09/17/22 with type 1 aortic dissection requiring emergent repair. Head CT 11/6 acute infarcts within R MCA involvement, including R cerebellum, basal ganglia, frontal lobe. ETT 11/4-11/9. PMH AAA s/p endovascular repair (2020), HTN, HLD, GERD.   TODAY'S TREATMENT:                                                                                                                                         DATE:   03/27/23: James Estrada completed homework of adding on to paragraph with given words to generate 7 sentence paragraph of a cohesive story.  To target written expression, word finding, and complex sentence generation,   Scientist, product/process development (VNeST) was utilized. The pt generated 3 subjects and objects for 2 verbs (prepare, defend), for a total of 6 subject objects. Pt required occasional min cues. Pt generated 2 complex sentences by answering "wh" questions. Pt required occasional min cues to generate complex sentences. James Estrada requires usual min to mod A to ID and self correct errors. He is improving awareness that he has completed VNeST many times but continues to required mod A to comprehend the task, instead attempting to write several independent sentences. James Estrada verbalized awareness that he has done this task several times but doesn't recall how to do it. Discussion re: improved awareness as well as ongoing cognitive impairments.    5/8/24[name] initiated HW of generating paragraph including given words. He generated 3 sentences with accuracy, and will continue writing the paragraph for James Estrada. Targeted reading comprehension of 3 paragraph articles in health magazine. James Estrada summarized and gave details re: both articles with cue to re-read when he did not know the details. Targeted complex sentence generation generating sentences with  multiple meaning words - James Estrada required usual min to mod to generate more than 2 meanings and to generate sentences rather than give a definition.  03/20/23: Targeted discourse summarizing news story with verbal and questioning cues required to generate salient, cohesive details. Chirag continues to do light reading outside of therapy, Kathie Rhodes would like him to do more. Targeted word finding, persuasive verbal expression of pro's and con's of simple topics (driving vs flying; cash vs credit) James Estrada generated 3-4 pros and cons with extended time and occasional min to mod semantic  and questioning cues.  03/15/23: Bernell returns with [facility] - written expression as sentence level completed with correct spelling. Targeted high level complex conversation re: [place] protests on college campuses and the use of police. Edgel required occasional min questioning cues and occasional extended time for word finding. He successfully verbalized his opinions and solutions with rare min A. Reading comprehension at multi paragraph simple article with consistent extended time. Hodge endorses reading is slower and more difficult than prior to the CVA. He required frequent mod to max verbal cues and questioning cues to ID 3 salient details re: the article. He also notes slower processing and difficulty focusing when reading. He has forgotten his glasses the past few sessions, Kathie Rhodes endorses his "forgetfulness" and lack of focus as well. Next session, consider informal cognitive assessment/adding cognitive goals.   03/13/23: HW not complete due to moving and care accident.  To target written expression, word finding, complex sentence generation  Scientist, product/process development (VNeST) was utilized. The pt generated 3 subjects and objects for 3 verbs (lock. Analyze, peel), for a total of 9 subject objects. Pt required usual semantic or questioning cues to generated 3rd subject/object. He generated 2 with rare min A and extended  time. Pt generated 3 complex sentences by answering "wh" questions. Pt required occasional min cues to generate complex sentences and occasional mod written or verbal cues to ID and correct written aphasic errors. They are having dinner with a neighbor at [facility] whom they knew a while back. Spurgeon required verbal cues to verbalize their first names and last names , however he did verbalize 3 facts about the couple with questioning cues. I demonstrated use of written cues to review their names, facts and any conversation topics that can be predicted. Educated that this should be used prior to social events to help Jhaylen for more confident communicating in social groups   03/06/23: Mondre returns [facility] - 95% accurate cohesive persuasive written paragraph re: his opinion of means testing for social security - 2 spelling errors,   6 sentneces. To target word finding, written expression, complex sentence generation  Scientist, product/process development (VNeST) was utilized. The pt generated 3 subjects and objects for 3 verbs (sell, wipe, carve), for a total of 9 subject objects. Pt required occasional min semantic cues. Pt generated 3 complex sentences by answering "wh" questions. Pt required rare min cues to generate complex sentences. Ahamad wrote subject-verb objects and complex sentences with 2-3 spelling errors per complex sentence. Mod A to ID and correct errors   03/01/23: Jahmir reports frustration with written expression writing paragraph level persuation. HW of 4-5 sentence persuasive paragraph with 6 aphasic errors which required mod verbal cues to corrrect. Targeted pursuasive verbal expression and reading comprehension reading and generating verbal summary and opinion of 2 articles. In persuasive narrative (college athletes being paid, AI affecting jobs and businesses) James Estrada required occasional mod questioning cues/requests for clarification to verbalize 3 pros and cons for each topic in cohesive  manner. He did verbalize 3 reasons for his opinion for each topic. HW - written 4-5 sentence paragraph re: his argument for or against means testing for social security.     PATIENT EDUCATION: Education details: See patient instructions, see today's treatment, compensations for aphasia Person educated: Patient and Spouse Education method: Explanation, Verbal cues, and Handouts Education comprehension: verbal cues required and needs further education  GOALS: Goals reviewed with patient? Yes   SHORT TERM GOALS: Target date: 01/23/23   Pt will  name 10 items in personally relevant categories over 3 sessions with occasional min A Baseline: Goal status: MET   2.  Pt will write 4 item grocery list with occasional min A Baseline:  Goal status: MET   3.  Pt will respond to 3 texts with occasional min A over 1 week Baseline:  Goal status: MET   4.  Pt will generate complex verbal sentences 3x using Scientist, product/process development with rare min A over 2 sessions Baseline:  Goal status: MET   5.  Pt will comprehend 4 sentence passage/email with extended time and rare min A Baseline:  Goal status: MET   6.  Pt will employ compensations for aphasia in structured language tasks with occasional min A Baseline:  Goal status: MET   LONG TERM GOALS: Target date: 06/12/23   Pt will complete complex naming tasks with 80% accuracy and occasional min A Baseline:  Goal status: MET   2.  Pt will carryover verbal compensations for aphasia with occasional min A in conversation as needed over 2 sessions Baseline:  Goal status: MET   3.  Pt will carryover 3 strategies to successfully communicate in community with clerks, servers etc Baseline:  Goal status: MET   4.  Pt will write 3 sentence text or email with occasional min A, external aids and occasional min A  Baseline:  Goal status: MET   5.  Pt will improve score on Communicative Participation Item Bank by 4 points Baseline:  14/30 Goal status: MET  6. Pt will comprehend 3-5 paragraph story or article by accurately summarizing salient details in cohesive summary with rare min A             Goal Status: Ongoing - recert 03/20/23  7. Pt will generate 5-7 sentence paragraph or email, Iding and correcting errors with occasional min A              Goal Status: Ongoing  8. Pt will generate detailed, cohesive summary of a movie, news, article show or story requiring 3 or less requests for clarification               Goal Status: Ongoing  9. Pt will carryover 2 compensations to make 3 phone calls to friends  or businesses with rare min A               Goal Status: Ongoing    ASSESSMENT:   CLINICAL IMPRESSION:  Demetrius  continues to make excellent progress and reports actively completing his HEP at home. Verbal expression for simple conversation is with mod I and mildly complex conversation with rare min A and occasional halting for word finding. Written expression at sentence level is accurate with rare min A and short paragraph level with occasional aphasic errors,  occasional mod A to ID and correct errors. However complex written sentences, and 5-6 sentence paragraphs continue to exhibit spelling, syntax and tangential errors.  Reading comprehension intact now at sentence and simple 3-4 sentence paragraph level. Ongoing extended time and occasional min to mod A for moderately complex paragraphs and short articles. Lavern endorses that reading is slower and labored. He also cites reduced attention and processing of details. Pt continues to benefit from ST intervention to maximize his communication and improve QoL at home and in the community.    OBJECTIVE IMPAIRMENTS: include memory and aphasia. These impairments are limiting patient from managing medications, managing appointments, managing finances, household responsibilities, ADLs/IADLs, and effectively communicating at home and  in community. Factors affecting potential to  achieve goals and functional outcome are ability to learn/carryover information. Patient will benefit from skilled SLP services to address above impairments and improve overall function.   REHAB POTENTIAL: Good   PLAN:   SLP FREQUENCY: 2x/week   SLP DURATION: 12 weeks   PLANNED INTERVENTIONS: Language facilitation, Environmental controls, Cueing hierachy, Cognitive reorganization, Internal/external aids, Functional tasks, and Multimodal communication approach     Lorrinda Ramstad, Radene Journey, CCC-SLP 03/27/2023, 3:19 PM

## 2023-03-29 ENCOUNTER — Encounter: Payer: Self-pay | Admitting: Speech Pathology

## 2023-03-29 ENCOUNTER — Encounter: Payer: Medicare Other | Admitting: Speech Pathology

## 2023-03-29 ENCOUNTER — Ambulatory Visit: Payer: Medicare Other | Admitting: Speech Pathology

## 2023-03-29 ENCOUNTER — Ambulatory Visit: Payer: Medicare Other | Admitting: Occupational Therapy

## 2023-03-29 DIAGNOSIS — R41841 Cognitive communication deficit: Secondary | ICD-10-CM | POA: Diagnosis not present

## 2023-03-29 DIAGNOSIS — I69354 Hemiplegia and hemiparesis following cerebral infarction affecting left non-dominant side: Secondary | ICD-10-CM | POA: Diagnosis not present

## 2023-03-29 DIAGNOSIS — R4701 Aphasia: Secondary | ICD-10-CM

## 2023-03-29 DIAGNOSIS — R339 Retention of urine, unspecified: Secondary | ICD-10-CM | POA: Diagnosis not present

## 2023-03-29 DIAGNOSIS — I10 Essential (primary) hypertension: Secondary | ICD-10-CM | POA: Diagnosis not present

## 2023-03-29 NOTE — Patient Instructions (Signed)
   Try to find natural ways to include Cheikh in group conversations - be natural but give him the opportunity to participate - the extra mili seconds   Yes, Kathie Rhodes, allow Andrew to talk a little more - tell the kids to also naturally give him a chance to be included when it's a group conversation  Multi tasking has likely been affected a little - if Berlon needs to hear something important, get his attention first - this is extra challenging in a group or when dining but he can do it

## 2023-03-29 NOTE — Therapy (Signed)
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   Arthritis    Bradycardia    CVA (cerebral vascular accident) (HCC) 10/04/2022   Dysrhythmia    frequent PAC, for 20 years   GERD (gastroesophageal reflux disease)    occ, OTC   Hemorrhoids    History of hiatal hernia    Hyperlipidemia    Hypertension    S/P aortic dissection repair 09/18/2022   Seasonal allergies    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) 10/26/2022   Past Surgical History:  Procedure Laterality Date   ABDOMINAL AORTIC ENDOVASCULAR STENT GRAFT N/A 05/04/2020   Procedure: ABDOMINAL AORTIC ENDOVASCULAR STENT GRAFT;  Surgeon: Larina Earthly, MD;  Location: [facility] OR;  Service: Vascular;  Laterality: N/A;   CATARACT EXTRACTION Bilateral 2009   DIAGNOSTIC LAPAROSCOPY     laparoscopic hernia repair   EYE SURGERY Bilateral    cataract removal   HERNIA REPAIR Bilateral 1999, 2006   JOINT REPLACEMENT Right ~2018   hip replacement   pheochromocytoma  1993   PROSTATECTOMY N/A 05/15/2013   Procedure: PROSTATECTOMY RETROPUBIC; SIMPLE OPEN PROSTATECTOMY;  Surgeon: Valetta Fuller, MD;  Location: [facility] ORS;  Service: Urology;  Laterality: N/A;   REPAIR OF ACUTE ASCENDING THORACIC AORTIC DISSECTION N/A 09/17/2022   Procedure: REPAIR OF ACUTE ASCENDING THORACIC AORTIC DISSECTION USING 28 MM HEMASHIELD PLATINUM VASCULAR GRAFT;  Surgeon: Loreli Slot, MD;  Location: [facility] OR;  Service: Vascular;  Laterality: N/A;  Median sternotomy   TOTAL ELBOW REPLACEMENT Left    > 30 years ago   TOTAL HIP ARTHROPLASTY Right 09/08/2017   Procedure: RIGHT TOTAL HIP ARTHROPLASTY  ANTERIOR APPROACH;  Surgeon: Kathryne Hitch, MD;  Location: [facility] ORS;  Service: Orthopedics;  Laterality: Right;   ULTRASOUND GUIDANCE FOR VASCULAR ACCESS Bilateral 05/04/2020   Procedure: ULTRASOUND GUIDANCE FOR VASCULAR ACCESS;  Surgeon: Larina Earthly, MD;  Location: [facility] OR;  Service: Vascular;  Laterality: Bilateral;   Patient Active Problem List   Diagnosis Date Noted   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) 10/26/2022   CVA (cerebral vascular accident) (HCC) 10/04/2022   Sepsis with acute renal failure without septic shock (HCC) 09/30/2022   S/P aortic dissection repair 09/18/2022   Status post surgery 09/17/2022   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) 05/22/2022   Colon cancer screening 05/22/2022   Essential hypertension 05/22/2022   Hemorrhoids 05/22/2022   Inguinal hernia 05/22/2022   Irritable bowel syndrome 05/22/2022   Mixed hyperlipidemia 05/22/2022   Pure hypercholesterolemia 05/22/2022   Thrombocytopenia (HCC) 05/22/2022   Allergic rhinitis 05/22/2022   AAA (abdominal aortic aneurysm) without rupture (HCC) 05/04/2020   Trochanteric bursitis, right hip 08/20/2018   History of right hip replacement 08/20/2018   Status post total replacement of right hip 09/08/2017   Pain of right hip joint 08/07/2017   Unilateral primary osteoarthritis, right hip 08/07/2017   BPH (benign prostatic hypertrophy) with urinary obstruction 05/15/2013    ONSET DATE: 09/17/22  REFERRING DIAG: R13.10 (ICD-10-CM) - Dysphagia, unspecified  THERAPY DIAG:  Aphasia  Rationale for Evaluation and Treatment: Rehabilitation  SUBJECTIVE:  SUBJECTIVE STATEMENT:"We moved into temporary housing at [facility]" PAIN:  Are you having pain? No   OBJECTIVE:  Pt is a 82 y/o M admitted to [facility] on 09/17/22 with type 1 aortic dissection requiring emergent repair. Head CT 11/6 acute infarcts within R MCA involvement, including R cerebellum, basal ganglia, frontal lobe. ETT 11/4-11/9. PMH  AAA s/p endovascular repair (2020), HTN, HLD, GERD.   TODAY'S TREATMENT:                                                                                                                                         DATE:   03/29/23: Discussed cognitive changes since CVA, Irfan continues to note word finding as main difficulty. Provided example of attention impairment when he was writing and his wife told him a word to write, he did not respond or attend to her due to cognitive load of writing a word. With verbal cues, they Id'd that divided attention is affected. Provided strategies to compensate for attention and processing when information is important. Targeted written expression in complex naming task- Huxton required occasional mod verbal cues to ID and correct written errors at word level. Demonstrated strategy of pre-planning names and topics before their lunch with a friend. Adolphus generated 3 family members of the friend they are having lunch with that he will ask about - we wrote the names on the white board, Kathie Rhodes named 2 more family of this friend and added it to white board. Ryerson generated 2 topics he can talk to their friend about ([employer] scholarship and grandson's summer plans)- which were also added to board as written cues.   5/13/24[name] completed homework of adding on to paragraph with given words to generate 7 sentence paragraph of a cohesive story.  To target written expression, word finding, and complex sentence generation,   Scientist, product/process development (VNeST) was utilized. The pt generated 3 subjects and objects for 2 verbs (prepare, defend), for a total of 6 subject objects. Pt required occasional min cues. Pt generated 2 complex sentences by answering "wh" questions. Pt required occasional min cues to generate complex sentences. Lejend requires usual min to mod A to ID and self correct errors. He is improving awareness that he has completed VNeST many times but continues to  required mod A to comprehend the task, instead attempting to write several independent sentences. Kwabena verbalized awareness that he has done this task several times but doesn't recall how to do it. Discussion re: improved awareness as well as ongoing cognitive impairments.    5/8/24[name] initiated HW of generating paragraph including given words. He generated 3 sentences with accuracy, and will continue writing the paragraph for [facility]. Targeted reading comprehension of 3 paragraph articles in health magazine. [name] summarized and gave details re: both articles with cue to re-read when he did not know the details. Targeted complex sentence generation generating sentences  with multiple meaning words - Jj required usual min to mod to generate more than 2 meanings and to generate sentences rather than give a definition.  03/20/23: Targeted discourse summarizing news story with verbal and questioning cues required to generate salient, cohesive details. Tareq continues to do light reading outside of therapy, Kathie Rhodes would like him to do more. Targeted word finding, persuasive verbal expression of pro's and con's of simple topics (driving vs flying; cash vs credit) [name] generated 3-4 pros and cons with extended time and occasional min to mod semantic and questioning cues.  03/15/23: Jeffre returns with [facility] - written expression as sentence level completed with correct spelling. Targeted high level complex conversation re: [place] protests on college campuses and the use of police. Dwan required occasional min questioning cues and occasional extended time for word finding. He successfully verbalized his opinions and solutions with rare min A. Reading comprehension at multi paragraph simple article with consistent extended time. Obrian endorses reading is slower and more difficult than prior to the CVA. He required frequent mod to max verbal cues and questioning cues to ID 3 salient details re: the article. He  also notes slower processing and difficulty focusing when reading. He has forgotten his glasses the past few sessions, Kathie Rhodes endorses his "forgetfulness" and lack of focus as well. Next session, consider informal cognitive assessment/adding cognitive goals.   03/13/23: HW not complete due to moving and care accident.  To target written expression, word finding, complex sentence generation  Scientist, product/process development (VNeST) was utilized. The pt generated 3 subjects and objects for 3 verbs (lock. Analyze, peel), for a total of 9 subject objects. Pt required usual semantic or questioning cues to generated 3rd subject/object. He generated 2 with rare min A and extended time. Pt generated 3 complex sentences by answering "wh" questions. Pt required occasional min cues to generate complex sentences and occasional mod written or verbal cues to ID and correct written aphasic errors. They are having dinner with a neighbor at [facility] whom they knew a while back. Abrahm required verbal cues to verbalize their first names and last names , however he did verbalize 3 facts about the couple with questioning cues. I demonstrated use of written cues to review their names, facts and any conversation topics that can be predicted. Educated that this should be used prior to social events to help Skylon for more confident communicating in social groups   03/06/23: Jaivion returns [facility] - 95% accurate cohesive persuasive written paragraph re: his opinion of means testing for social security - 2 spelling errors,   6 sentneces. To target word finding, written expression, complex sentence generation  Scientist, product/process development (VNeST) was utilized. The pt generated 3 subjects and objects for 3 verbs (sell, wipe, carve), for a total of 9 subject objects. Pt required occasional min semantic cues. Pt generated 3 complex sentences by answering "wh" questions. Pt required rare min cues to generate complex sentences.  Delon wrote subject-verb objects and complex sentences with 2-3 spelling errors per complex sentence. Mod A to ID and correct errors    PATIENT EDUCATION: Education details: See patient instructions, see today's treatment, compensations for aphasia Person educated: Patient and Spouse Education method: Explanation, Verbal cues, and Handouts Education comprehension: verbal cues required and needs further education  GOALS: Goals reviewed with patient? Yes   SHORT TERM GOALS: Target date: 01/23/23   Pt will name 10 items in personally relevant categories over 3 sessions with occasional min A  Baseline: Goal status: MET   2.  Pt will write 4 item grocery list with occasional min A Baseline:  Goal status: MET   3.  Pt will respond to 3 texts with occasional min A over 1 week Baseline:  Goal status: MET   4.  Pt will generate complex verbal sentences 3x using Scientist, product/process development with rare min A over 2 sessions Baseline:  Goal status: MET   5.  Pt will comprehend 4 sentence passage/email with extended time and rare min A Baseline:  Goal status: MET   6.  Pt will employ compensations for aphasia in structured language tasks with occasional min A Baseline:  Goal status: MET   LONG TERM GOALS: Target date: 06/12/23   Pt will complete complex naming tasks with 80% accuracy and occasional min A Baseline:  Goal status: MET   2.  Pt will carryover verbal compensations for aphasia with occasional min A in conversation as needed over 2 sessions Baseline:  Goal status: MET   3.  Pt will carryover 3 strategies to successfully communicate in community with clerks, servers etc Baseline:  Goal status: MET   4.  Pt will write 3 sentence text or email with occasional min A, external aids and occasional min A  Baseline:  Goal status: MET   5.  Pt will improve score on Communicative Participation Item Bank by 4 points Baseline: 14/30 Goal status: MET  6. Pt will  comprehend 3-5 paragraph story or article by accurately summarizing salient details in cohesive summary with rare min A             Goal Status: Ongoing - recert 03/20/23  7. Pt will generate 5-7 sentence paragraph or email, Iding and correcting errors with occasional min A              Goal Status: Ongoing  8. Pt will generate detailed, cohesive summary of a movie, news, article show or story requiring 3 or less requests for clarification               Goal Status: Ongoing  9. Pt will carryover 2 compensations to make 3 phone calls to friends  or businesses with rare min A               Goal Status: Ongoing    ASSESSMENT:   CLINICAL IMPRESSION:  Merrit  continues to make excellent progress and reports actively completing his HEP at home. Verbal expression for simple conversation is with mod I and mildly complex conversation with rare min A and occasional halting for word finding. Written expression at sentence level is accurate with rare min A and short paragraph level with occasional aphasic errors,  occasional mod A to ID and correct errors. However complex written sentences, and 5-6 sentence paragraphs continue to exhibit spelling, syntax and tangential errors.  Reading comprehension intact now at sentence and simple 3-4 sentence paragraph level. Ongoing extended time and occasional min to mod A for moderately complex paragraphs and short articles. Davi endorses that reading is slower and labored. He also cites reduced attention and processing of details. Pt continues to benefit from ST intervention to maximize his communication and improve QoL at home and in the community.    OBJECTIVE IMPAIRMENTS: include memory and aphasia. These impairments are limiting patient from managing medications, managing appointments, managing finances, household responsibilities, ADLs/IADLs, and effectively communicating at home and in community. Factors affecting potential to achieve goals and functional outcome  are ability  to learn/carryover information. Patient will benefit from skilled SLP services to address above impairments and improve overall function.   REHAB POTENTIAL: Good   PLAN:   SLP FREQUENCY: 2x/week   SLP DURATION: 12 weeks   PLANNED INTERVENTIONS: Language facilitation, Environmental controls, Cueing hierachy, Cognitive reorganization, Internal/external aids, Functional tasks, and Multimodal communication approach     Deontez Klinke, Radene Journey, CCC-SLP 03/29/2023, 11:58 AM

## 2023-03-30 DIAGNOSIS — N319 Neuromuscular dysfunction of bladder, unspecified: Secondary | ICD-10-CM | POA: Diagnosis not present

## 2023-04-03 ENCOUNTER — Encounter: Payer: Medicare Other | Admitting: Occupational Therapy

## 2023-04-03 ENCOUNTER — Encounter: Payer: Self-pay | Admitting: Speech Pathology

## 2023-04-03 ENCOUNTER — Ambulatory Visit: Payer: Medicare Other | Admitting: Speech Pathology

## 2023-04-03 DIAGNOSIS — R41841 Cognitive communication deficit: Secondary | ICD-10-CM | POA: Diagnosis not present

## 2023-04-03 DIAGNOSIS — R4701 Aphasia: Secondary | ICD-10-CM | POA: Diagnosis not present
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
## 2023-04-04 DIAGNOSIS — K080 Exfoliation of teeth due to systemic causes: Secondary | ICD-10-CM | POA: Diagnosis not present

## 2023-04-05 ENCOUNTER — Encounter: Payer: Medicare Other | Admitting: Occupational Therapy

## 2023-04-05 ENCOUNTER — Encounter: Payer: Self-pay | Admitting: Speech Pathology

## 2023-04-05 ENCOUNTER — Ambulatory Visit: Payer: Medicare Other | Admitting: Speech Pathology

## 2023-04-05 DIAGNOSIS — R4701 Aphasia: Secondary | ICD-10-CM

## 2023-04-05 DIAGNOSIS — R41841 Cognitive communication deficit: Secondary | ICD-10-CM | POA: Diagnosis not present
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
## 2023-04-07 DIAGNOSIS — R531 Weakness: Secondary | ICD-10-CM | POA: Diagnosis not present

## 2023-04-12 ENCOUNTER — Ambulatory Visit: Payer: Medicare Other | Admitting: Speech Pathology

## 2023-04-12 ENCOUNTER — Encounter: Payer: Self-pay | Admitting: Speech Pathology

## 2023-04-12 ENCOUNTER — Encounter: Payer: Medicare Other | Admitting: Occupational Therapy

## 2023-04-12 DIAGNOSIS — G3184 Mild cognitive impairment, so stated: Secondary | ICD-10-CM | POA: Diagnosis not present

## 2023-04-12 DIAGNOSIS — R4701 Aphasia: Secondary | ICD-10-CM | POA: Diagnosis not present

## 2023-04-12 DIAGNOSIS — F32A Depression, unspecified: Secondary | ICD-10-CM | POA: Diagnosis not present

## 2023-04-12 DIAGNOSIS — R41841 Cognitive communication deficit: Secondary | ICD-10-CM | POA: Diagnosis not present
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
## 2023-04-12 NOTE — Patient Instructions (Signed)
   [employer] Connections game  Neuro nerds pod cast - [email or web address]  [employer] app

## 2023-04-14 ENCOUNTER — Encounter: Payer: Medicare Other | Admitting: Occupational Therapy

## 2023-04-19 ENCOUNTER — Ambulatory Visit: Payer: Medicare Other | Admitting: Speech Pathology

## 2023-05-01 ENCOUNTER — Ambulatory Visit: Payer: Medicare Other | Admitting: Speech Pathology

## 2023-05-03 ENCOUNTER — Ambulatory Visit: Payer: Medicare Other | Attending: Internal Medicine | Admitting: Speech Pathology

## 2023-05-03 ENCOUNTER — Encounter: Payer: Self-pay | Admitting: Speech Pathology

## 2023-05-03 DIAGNOSIS — R4701 Aphasia: Secondary | ICD-10-CM | POA: Diagnosis not present

## 2023-05-03 DIAGNOSIS — R41841 Cognitive communication deficit: Secondary | ICD-10-CM | POA: Diagnosis not present

## 2023-05-03 DIAGNOSIS — F322 Major depressive disorder, single episode, severe without psychotic features: Secondary | ICD-10-CM | POA: Diagnosis not present

## 2023-05-03 NOTE — Therapy (Signed)
OUTPATIENT SPEECH LANGUAGE PATHOLOGY TREATMENT NOTE   Patient Name: James Estrada MRN: [id number] DOB:August 22, 1941, 82 y.o., male Today's Date: 05/03/2023  PCP: Renford Dills, MD REFERRING PROVIDER: Renford Dills, MD  END OF SESSION:   End of Session - 05/03/23 1322     Visit Number 26    Number of Visits 50    Date for SLP Re-Evaluation 06/12/23    Authorization Type Medicare    SLP Start Time 1315    SLP Stop Time  1400    SLP Time Calculation (min) 45 min    Activity Tolerance Patient tolerated treatment well                 Past Medical History:  Diagnosis Date   AAA (abdominal aortic aneurysm) (HCC)    last u/s done 07/18/17    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) 05/22/2022   Arthritis    Bradycardia    CVA (cerebral vascular accident) (HCC) 10/04/2022   Dysrhythmia    frequent PAC, for 20 years   GERD (gastroesophageal reflux disease)    occ, OTC   Hemorrhoids    History of hiatal hernia    Hyperlipidemia    Hypertension    S/P aortic dissection repair 09/18/2022   Seasonal allergies    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) 10/26/2022   Past Surgical History:  Procedure Laterality Date   ABDOMINAL AORTIC ENDOVASCULAR STENT GRAFT N/A 05/04/2020   Procedure: ABDOMINAL AORTIC ENDOVASCULAR STENT GRAFT;  Surgeon: Larina Earthly, MD;  Location: [facility];  Service: Vascular;  Laterality: N/A;   CATARACT EXTRACTION Bilateral 2009   DIAGNOSTIC LAPAROSCOPY     laparoscopic hernia repair   EYE SURGERY Bilateral    cataract removal   HERNIA REPAIR Bilateral 1999, 2006   JOINT REPLACEMENT Right ~2018   hip replacement   pheochromocytoma  1993   PROSTATECTOMY N/A 05/15/2013   Procedure: PROSTATECTOMY RETROPUBIC; SIMPLE OPEN PROSTATECTOMY;  Surgeon: Valetta Fuller, MD;  Location: [facility];  Service: Urology;  Laterality: N/A;   REPAIR OF ACUTE ASCENDING THORACIC AORTIC DISSECTION N/A 09/17/2022   Procedure: REPAIR OF ACUTE ASCENDING THORACIC AORTIC  DISSECTION USING 28 MM HEMASHIELD PLATINUM VASCULAR GRAFT;  Surgeon: Loreli Slot, MD;  Location: [facility] OR;  Service: Vascular;  Laterality: N/A;  Median sternotomy   TOTAL ELBOW REPLACEMENT Left    > 30 years ago   TOTAL HIP ARTHROPLASTY Right 09/08/2017   Procedure: RIGHT TOTAL HIP ARTHROPLASTY ANTERIOR APPROACH;  Surgeon: Kathryne Hitch, MD;  Location: [facility];  Service: Orthopedics;  Laterality: Right;   ULTRASOUND GUIDANCE FOR VASCULAR ACCESS Bilateral 05/04/2020   Procedure: ULTRASOUND GUIDANCE FOR VASCULAR ACCESS;  Surgeon: Larina Earthly, MD;  Location: [facility] OR;  Service: Vascular;  Laterality: Bilateral;   Patient Active Problem List   Diagnosis Date Noted   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) 10/26/2022   CVA (cerebral vascular accident) (HCC) 10/04/2022   Sepsis with acute renal failure without septic shock (HCC) 09/30/2022   S/P aortic dissection repair 09/18/2022   Status post surgery 09/17/2022   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) 05/22/2022   Colon cancer screening 05/22/2022   Essential hypertension 05/22/2022   Hemorrhoids 05/22/2022   Inguinal hernia 05/22/2022   Irritable bowel syndrome 05/22/2022   Mixed hyperlipidemia 05/22/2022   Pure hypercholesterolemia 05/22/2022   Thrombocytopenia (HCC) 05/22/2022   Allergic rhinitis 05/22/2022   AAA (abdominal aortic aneurysm) without rupture (HCC) 05/04/2020   Trochanteric  bursitis, right hip 08/20/2018   History of right hip replacement 08/20/2018   Status post total replacement of right hip 09/08/2017   Pain of right hip joint 08/07/2017   Unilateral primary osteoarthritis, right hip 08/07/2017   BPH (benign prostatic hypertrophy) with urinary obstruction 05/15/2013    ONSET DATE: 09/17/22  REFERRING DIAG: R13.10 (ICD-10-CM) - Dysphagia, unspecified  THERAPY DIAG:  Aphasia  Cognitive communication deficit  Rationale for Evaluation and Treatment: Rehabilitation  SUBJECTIVE:    SUBJECTIVE STATEMENT:"We have been through it" re: move into [facility] - pt took 2 weeks off due to move PAIN:  Are you having pain? No   OBJECTIVE:  Pt is a 82 y/o M admitted to [facility] on 09/17/22 with type 1 aortic dissection requiring emergent repair. Head CT 11/6 acute infarcts within R MCA involvement, including R cerebellum, basal ganglia, frontal lobe. ETT 11/4-11/9. PMH AAA s/p endovascular repair (2020), HTN, HLD, GERD.   TODAY'S TREATMENT:                                                                                                                                         DATE:   05/03/23: James Estrada returns after 2 weeks due to move from his home into [facility] - independent living. Targeted attention, problem solving, sequencing navigating music on iphone. James Estrada required mod A, modeling and written cues to give James Estrada exact command to "open my [employer]" as he told her "play music" and "open [employer]" after several practice commands, James Estrada successfully opened his [employer]. He required frequent mod A to ID and select country, jazz, oldies play lists and max A to use library to find a Paramedic. Electrical engineer feature on iPhone to put frequently used applications on first page - they are to have their son work with them on this feature. Akin accessed note section on his phone with frequent mod A - visual cues - his note app is complex with many folders and folders in folders. This level of detail may not be feasible. James Estrada is attempting to use James Estrada to add appointments in his calendar, however he has to make many attempts to get this correct. We generated and practiced strategy of writing down the appointment, time and date including am/pm for James Estrada to read this to James Estrada - with usual mod verbal and written cues, James Estrada successfully added his next ST appointment after 2 attempts.  04/12/23: James Estrada reports he made successful phone call to a plumber re: pipe leak - he described  the problem, set appointment time and date. He has also had phone calls with remote friends with success. HW completed (written sentences with 3 given words) James Estrada generated sentences with 2/3 given words - usual min A and extended time to ID and correct errors. Targeted narrative discourse re: cause of leak and summarizing a podcast he listened to re: Trump trial - Aristide continues to  require usual clarification requests to repair communication break down at narrative level due to aphasia and processing. Sequencing events in narratives cohesively requires usual questioning cues. Extended time and halting due to word finding.   04/05/23: James Estrada read sentences he wrote for home work - he noted his errors and noted when his sentences however required mod A to correct them. Narrative discourse targeted in 2 stories about when his children were born. James Estrada required verbal cues to ID semantic paraphasias (pediatrician/Obstetrician; peaches/strawberries; sister/aunt) and occasional questioning cues to clarify sequence and salient details. Usual halting for word finding in both narratives. VNeST to target word finding and sentence generation - using the verb "repair" Jamer generated 4 subjects and objects with occasional semantic cues. He generated complex sentence with the verb with frequent semantic, questioning cues to answer and write "wh" questions - confusion noted in this activity writing sentence - mod A to ID and correct errors. Keelan named 3 couples they have plan with, 2 tonight and 1 on Saturday - 2/6 names required cues/corrections from spouse  04/03/23: James Estrada and James Estrada report success participating in conversation at lunch with a friend. He brings in his paragraph level homework, with 6 sentences, errors corrected . He successfully made short phone call to his eye doctor (made 1 phone call to person outside of family to date) James Estrada continues to report communication breakdown due to becoming anxious  when he falters or can't find a word. Targeted compensations for aphasia generating 3 similarities and 3 differences of 4 pairs or related words to train in using semantic features to describe as compensation for aphasia. Benajamin required extended time consistently, frequent pausing for word finding and required usual min semantic or questioning cues to generate a total of 24 similarities and differences. In this task and conversation Lashawn requires semantic cues to correct use of related word (semantic paraphasia or inaccurate substitution)  03/29/23: Discussed cognitive changes since CVA, James Estrada continues to note word finding as main difficulty. Provided example of attention impairment when he was writing and his wife told him a word to write, he did not respond or attend to her due to cognitive load of writing a word. With verbal cues, they Id'd that divided attention is affected. Provided strategies to compensate for attention and processing when information is important. Targeted written expression in complex naming task- Edilberto required occasional mod verbal cues to ID and correct written errors at word level. Demonstrated strategy of pre-planning names and topics before their lunch with a friend. Bryon generated 3 family members of the friend they are having lunch with that he will ask about - we wrote the names on the white board, James Estrada named 2 more family of this friend and added it to white board. Cuong generated 2 topics he can talk to their friend about ([employer] scholarship and grandson's summer plans)- which were also added to board as written cues.   5/13/24[name] completed homework of adding on to paragraph with given words to generate 7 sentence paragraph of a cohesive story.  To target written expression, word finding, and complex sentence generation,   Scientist, product/process development (VNeST) was utilized. The pt generated 3 subjects and objects for 2 verbs (prepare, defend), for a total of  6 subject objects. Pt required occasional min cues. Pt generated 2 complex sentences by answering "wh" questions. Pt required occasional min cues to generate complex sentences. Villard requires usual min to mod A to ID and self correct errors. He is improving  awareness that he has completed VNeST many times but continues to required mod A to comprehend the task, instead attempting to write several independent sentences. Wilma verbalized awareness that he has done this task several times but doesn't recall how to do it. Discussion re: improved awareness as well as ongoing cognitive impairments.    5/8/24[name] initiated HW of generating paragraph including given words. He generated 3 sentences with accuracy, and will continue writing the paragraph for [facility]. Targeted reading comprehension of 3 paragraph articles in health magazine. James Estrada summarized and gave details re: both articles with cue to re-read when he did not know the details. Targeted complex sentence generation generating sentences with multiple meaning words - Delante required usual min to mod to generate more than 2 meanings and to generate sentences rather than give a definition.  03/20/23: Targeted discourse summarizing news story with verbal and questioning cues required to generate salient, cohesive details. Dorance continues to do light reading outside of therapy, James Estrada would like him to do more. Targeted word finding, persuasive verbal expression of pro's and con's of simple topics (driving vs flying; cash vs credit) James Estrada generated 3-4 pros and cons with extended time and occasional min to mod semantic and questioning cues.  03/15/23: James Estrada returns with James Estrada - written expression as sentence level completed with correct spelling. Targeted high level complex conversation re: [place] protests on college campuses and the use of police. James Estrada required occasional min questioning cues and occasional extended time for word finding. He successfully  verbalized his opinions and solutions with rare min A. Reading comprehension at multi paragraph simple article with consistent extended time. James Estrada endorses reading is slower and more difficult than prior to the CVA. He required frequent mod to max verbal cues and questioning cues to ID 3 salient details re: the article. He also notes slower processing and difficulty focusing when reading. He has forgotten his glasses the past few sessions, James Estrada endorses his "forgetfulness" and lack of focus as well. Next session, consider informal cognitive assessment/adding cognitive goals.   03/13/23: HW not complete due to moving and care accident.  To target written expression, word finding, complex sentence generation  Scientist, product/process development (VNeST) was utilized. The pt generated 3 subjects and objects for 3 verbs (lock. Analyze, peel), for a total of 9 subject objects. Pt required usual semantic or questioning cues to generated 3rd subject/object. He generated 2 with rare min A and extended time. Pt generated 3 complex sentences by answering "wh" questions. Pt required occasional min cues to generate complex sentences and occasional mod written or verbal cues to ID and correct written aphasic errors. They are having dinner with a neighbor at [employer] whom they knew a while back. James Estrada required verbal cues to verbalize their first names and last names , however he did verbalize 3 facts about the couple with questioning cues. I demonstrated use of written cues to review their names, facts and any conversation topics that can be predicted. Educated that this should be used prior to social events to help James Estrada for more confident communicating in social groups     PATIENT EDUCATION: Education details: See patient instructions, see today's treatment, compensations for aphasia Person educated: Patient and Spouse Education method: Explanation, Verbal cues, and Handouts Education comprehension: verbal  cues required and needs further education  GOALS: Goals reviewed with patient? Yes   SHORT TERM GOALS: Target date: 01/23/23   Pt will name 10 items in personally relevant categories over 3 sessions  with occasional min A Baseline: Goal status: MET   2.  Pt will write 4 item grocery list with occasional min A Baseline:  Goal status: MET   3.  Pt will respond to 3 texts with occasional min A over 1 week Baseline:  Goal status: MET   4.  Pt will generate complex verbal sentences 3x using Scientist, product/process development with rare min A over 2 sessions Baseline:  Goal status: MET   5.  Pt will comprehend 4 sentence passage/email with extended time and rare min A Baseline:  Goal status: MET   6.  Pt will employ compensations for aphasia in structured language tasks with occasional min A Baseline:  Goal status: MET   LONG TERM GOALS: Target date: 06/12/23   Pt will complete complex naming tasks with 80% accuracy and occasional min A Baseline:  Goal status: MET   2.  Pt will carryover verbal compensations for aphasia with occasional min A in conversation as needed over 2 sessions Baseline:  Goal status: MET   3.  Pt will carryover 3 strategies to successfully communicate in community with clerks, servers etc Baseline:  Goal status: MET   4.  Pt will write 3 sentence text or email with occasional min A, external aids and occasional min A  Baseline:  Goal status: MET   5.  Pt will improve score on Communicative Participation Item Bank by 4 points Baseline: 14/30 Goal status: MET  6. Pt will comprehend 3-5 paragraph story or article by accurately summarizing salient details in cohesive summary with rare min A             Goal Status: Ongoing - recert 03/20/23  7. Pt will generate 5-7 sentence paragraph or email, Iding and correcting errors with occasional min A              Goal Status: MET  8. Pt will generate detailed, cohesive summary of a movie, news, article show  or story requiring 3 or less requests for clarification               Goal Status: Ongoing  9. Pt will carryover 2 compensations to make 3 phone calls to friends  or businesses with rare min A               Goal Status: MET  10. Pt will navigate music, photos and note section on his phone with occasional min A                Goal Status: New    ASSESSMENT:   CLINICAL IMPRESSION:  James Estrada  continues to make excellent progress and reports actively completing his HEP at home. Verbal expression for simple conversation is with mod I and mildly complex conversation with rare min A and occasional halting for word finding. Written expression at sentence level is accurate with rare min A and short paragraph level with occasional aphasic errors,  occasional mod A to ID and correct errors. However complex written sentences, and 5-6 sentence paragraphs continue to exhibit spelling, syntax and tangential errors.  Reading comprehension intact now at sentence and simple 3-4 sentence paragraph level. Ongoing extended time and occasional min to mod A for moderately complex paragraphs and short articles. James Estrada endorses that reading is slower and labored. He also cites reduced attention and processing of details James [facility]strada verbalized that he would like to work on using the notes, photos, music in his iphone as he was proficient with this prior to  CVA and cognitive impairments have affected his ability to use his phone. Pt continues to benefit from ST intervention to maximize his communication and improve QoL at home and in the community. Added goal for iphone navigation on 04/12/23   OBJECTIVE IMPAIRMENTS: include memory and aphasia. These impairments are limiting patient from managing medications, managing appointments, managing finances, household responsibilities, ADLs/IADLs, and effectively communicating at home and in community. Factors affecting potential to achieve goals and functional outcome are ability to  learn/carryover information. Patient will benefit from skilled SLP services to address above impairments and improve overall function.   REHAB POTENTIAL: Good   PLAN:   SLP FREQUENCY: 2x/week   SLP DURATION: 12 weeks   PLANNED INTERVENTIONS: Language facilitation, Environmental controls, Cueing hierachy, Cognitive reorganization, Internal/external aids, Functional tasks, and Multimodal communication approach     Zyara Riling, Radene Journey, CCC-SLP 05/03/2023, 3:07 PM

## 2023-05-03 NOTE — Patient Instructions (Addendum)
   Hey Beckey Rutter - open my music Banker - Accessibility- Assistive Access - with [place] think about setting this up to easily access your:  Calls, texts, notes, camera, photos, music on the front page - practice accessing it  If calls and texts are working for you, maybe you don't have to add them on the front page  Write down your appointments on the white board or scrap - then speak it into [place] so you don't have to tell her 3x

## 2023-05-15 ENCOUNTER — Encounter: Payer: Self-pay | Admitting: Speech Pathology

## 2023-05-15 ENCOUNTER — Ambulatory Visit: Payer: Medicare Other | Attending: Internal Medicine | Admitting: Speech Pathology

## 2023-05-15 DIAGNOSIS — R4701 Aphasia: Secondary | ICD-10-CM | POA: Diagnosis not present

## 2023-05-15 DIAGNOSIS — R41841 Cognitive communication deficit: Secondary | ICD-10-CM | POA: Insufficient documentation

## 2023-05-15 NOTE — Therapy (Signed)
OUTPATIENT SPEECH LANGUAGE PATHOLOGY TREATMENT NOTE   Patient Name: James Estrada MRN: [id number] DOB:1940/12/26, 82 y.o., male Today's Date: 05/15/2023  PCP: Renford Dills, MD REFERRING PROVIDER: Renford Dills, MD  END OF SESSION:   End of Session - 05/15/23 1319     Visit Number 27    Number of Visits 50    Date for SLP Re-Evaluation 06/12/23    Authorization Type Medicare    SLP Start Time 1315    SLP Stop Time  1400    SLP Time Calculation (min) 45 min    Activity Tolerance Patient tolerated treatment well                 Past Medical History:  Diagnosis Date   AAA (abdominal aortic aneurysm) (HCC)    last u/s done 07/18/17    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) 05/22/2022   Arthritis    Bradycardia    CVA (cerebral vascular accident) (HCC) 10/04/2022   Dysrhythmia    frequent PAC, for 20 years   GERD (gastroesophageal reflux disease)    occ, OTC   Hemorrhoids    History of hiatal hernia    Hyperlipidemia    Hypertension    S/P aortic dissection repair 09/18/2022   Seasonal allergies    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) 10/26/2022   Past Surgical History:  Procedure Laterality Date   ABDOMINAL AORTIC ENDOVASCULAR STENT GRAFT N/A 05/04/2020   Procedure: ABDOMINAL AORTIC ENDOVASCULAR STENT GRAFT;  Surgeon: Larina Earthly, MD;  Location: [facility] OR;  Service: Vascular;  Laterality: N/A;   CATARACT EXTRACTION Bilateral 2009   DIAGNOSTIC LAPAROSCOPY     laparoscopic hernia repair   EYE SURGERY Bilateral    cataract removal   HERNIA REPAIR Bilateral 1999, 2006   JOINT REPLACEMENT Right ~2018   hip replacement   pheochromocytoma  1993   PROSTATECTOMY N/A 05/15/2013   Procedure: PROSTATECTOMY RETROPUBIC; SIMPLE OPEN PROSTATECTOMY;  Surgeon: Valetta Fuller, MD;  Location: [facility] ORS;  Service: Urology;  Laterality: N/A;   REPAIR OF ACUTE ASCENDING THORACIC AORTIC DISSECTION N/A 09/17/2022   Procedure: REPAIR OF ACUTE ASCENDING THORACIC AORTIC  DISSECTION USING 28 MM HEMASHIELD PLATINUM VASCULAR GRAFT;  Surgeon: Loreli Slot, MD;  Location: [facility] OR;  Service: Vascular;  Laterality: N/A;  Median sternotomy   TOTAL ELBOW REPLACEMENT Left    > 30 years ago   TOTAL HIP ARTHROPLASTY Right 09/08/2017   Procedure: RIGHT TOTAL HIP ARTHROPLASTY ANTERIOR APPROACH;  Surgeon: Kathryne Hitch, MD;  Location: [facility] ORS;  Service: Orthopedics;  Laterality: Right;   ULTRASOUND GUIDANCE FOR VASCULAR ACCESS Bilateral 05/04/2020   Procedure: ULTRASOUND GUIDANCE FOR VASCULAR ACCESS;  Surgeon: Larina Earthly, MD;  Location: [facility] OR;  Service: Vascular;  Laterality: Bilateral;   Patient Active Problem List   Diagnosis Date Noted   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) 10/26/2022   CVA (cerebral vascular accident) (HCC) 10/04/2022   Sepsis with acute renal failure without septic shock (HCC) 09/30/2022   S/P aortic dissection repair 09/18/2022   Status post surgery 09/17/2022   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) 05/22/2022   Colon cancer screening 05/22/2022   Essential hypertension 05/22/2022   Hemorrhoids 05/22/2022   Inguinal hernia 05/22/2022   Irritable bowel syndrome 05/22/2022   Mixed hyperlipidemia 05/22/2022   Pure hypercholesterolemia 05/22/2022   Thrombocytopenia (HCC) 05/22/2022   Allergic rhinitis 05/22/2022   AAA (abdominal aortic aneurysm) without rupture (HCC) 05/04/2020   Trochanteric  bursitis, right hip 08/20/2018   History of right hip replacement 08/20/2018   Status post total replacement of right hip 09/08/2017   Pain of right hip joint 08/07/2017   Unilateral primary osteoarthritis, right hip 08/07/2017   BPH (benign prostatic hypertrophy) with urinary obstruction 05/15/2013    ONSET DATE: 09/17/22  REFERRING DIAG: R13.10 (ICD-10-CM) - Dysphagia, unspecified  THERAPY DIAG:  Aphasia  Cognitive communication deficit  Rationale for Evaluation and Treatment: Rehabilitation  SUBJECTIVE:    SUBJECTIVE STATEMENT:"We have been through it" re: move into [facility] - pt took 2 weeks off due to move PAIN:  Are you having pain? No   OBJECTIVE:  Pt is a 82 y/o M admitted to [facility] on 09/17/22 with type 1 aortic dissection requiring emergent repair. Head CT 11/6 acute infarcts within R MCA involvement, including R cerebellum, basal ganglia, frontal lobe. ETT 11/4-11/9. PMH AAA s/p endovascular repair (2020), HTN, HLD, GERD.   TODAY'S TREATMENT:                                                                                                                                         DATE:   05/15/23:Rylend continues to practice accessing music, contacts and photos - most success with accessing his music library. He continues to use [employer] and written cues to add appointments to his phone calendar, writing out the information including am/pm. Targeted reading comprehension, attention to detail and mental math reading chart for basketball tickets and attending to details -Mycheal required frequent mod verbal cues and written cues (for math) to answer questions accurately 5/5x. In organization and reading comprehension task, Christophen required frequent mod verbal and visual cues to follow instructions to organize information. He required consistent extended time for processing  05/03/23: Dameyon returns after 2 weeks due to move from his home into [facility] - independent living. Targeted attention, problem solving, sequencing navigating music on iphone. [name] required mod A, modeling and written cues to give Beckey Rutter exact command to "open my [employer]" as he told her "play music" and "open [employer]" after several practice commands, Zebastian successfully opened his [employer]. He required frequent mod A to ID and select country, jazz, oldies play lists and max A to use library to find a Paramedic. Electrical engineer feature on iPhone to put frequently used applications on first page - they are to have their  son work with them on this feature. Khade accessed note section on his phone with frequent mod A - visual cues - his note app is complex with many folders and folders in folders. This level of detail may not be feasible. Derrious is attempting to use Beckey Rutter to add appointments in his calendar, however he has to make many attempts to get this correct. We generated and practiced strategy of writing down the appointment, time and date including am/pm for Jaydus to read this to Edmondson - with  usual mod verbal and written cues, Radcliffe successfully added his next ST appointment after 2 attempts.  04/12/23: [name] reports he made successful phone call to a plumber re: pipe leak - he described the problem, set appointment time and date. He has also had phone calls with remote friends with success. HW completed (written sentences with 3 given words) [name] generated sentences with 2/3 given words - usual min A and extended time to ID and correct errors. Targeted narrative discourse re: cause of leak and summarizing a podcast he listened to re: Trump trial - Javaun continues to require usual clarification requests to repair communication break down at narrative level due to aphasia and processing. Sequencing events in narratives cohesively requires usual questioning cues. Extended time and halting due to word finding.   04/05/23: [name] read sentences he wrote for home work - he noted his errors and noted when his sentences however required mod A to correct them. Narrative discourse targeted in 2 stories about when his children were born. [name] required verbal cues to ID semantic paraphasias (pediatrician/Obstetrician; peaches/strawberries; sister/aunt) and occasional questioning cues to clarify sequence and salient details. Usual halting for word finding in both narratives. VNeST to target word finding and sentence generation - using the verb "repair" Sim generated 4 subjects and objects with occasional semantic cues. He  generated complex sentence with the verb with frequent semantic, questioning cues to answer and write "wh" questions - confusion noted in this activity writing sentence - mod A to ID and correct errors. Johnedward named 3 couples they have plan with, 2 tonight and 1 on Saturday - 2/6 names required cues/corrections from spouse  04/03/23: [name] and Kathie Rhodes report success participating in conversation at lunch with a friend. He brings in his paragraph level homework, with 6 sentences, errors corrected . He successfully made short phone call to his eye doctor (made 1 phone call to person outside of family to date) Bernardo continues to report communication breakdown due to becoming anxious when he falters or can't find a word. Targeted compensations for aphasia generating 3 similarities and 3 differences of 4 pairs or related words to train in using semantic features to describe as compensation for aphasia. Junayd required extended time consistently, frequent pausing for word finding and required usual min semantic or questioning cues to generate a total of 24 similarities and differences. In this task and conversation Skiler requires semantic cues to correct use of related word (semantic paraphasia or inaccurate substitution)  03/29/23: Discussed cognitive changes since CVA, Tavaras continues to note word finding as main difficulty. Provided example of attention impairment when he was writing and his wife told him a word to write, he did not respond or attend to her due to cognitive load of writing a word. With verbal cues, they Id'd that divided attention is affected. Provided strategies to compensate for attention and processing when information is important. Targeted written expression in complex naming task- [employer] required occasional mod verbal cues to ID and correct written errors at word level. Demonstrated strategy of pre-planning names and topics before their lunch with a friend. Dohn generated 3 family members  of the friend they are having lunch with that he will ask about - we wrote the names on the white board, Kathie Rhodes named 2 more family of this friend and added it to white board. Travaris generated 2 topics he can talk to their friend about ([employer] scholarship and grandson's summer plans)- which were also added to board as written cues.  5/13/24[name] completed homework of adding on to paragraph with given words to generate 7 sentence paragraph of a cohesive story.  To target written expression, word finding, and complex sentence generation,   Scientist, product/process development (VNeST) was utilized. The pt generated 3 subjects and objects for 2 verbs (prepare, defend), for a total of 6 subject objects. Pt required occasional min cues. Pt generated 2 complex sentences by answering "wh" questions. Pt required occasional min cues to generate complex sentences. Laman requires usual min to mod A to ID and self correct errors. He is improving awareness that he has completed VNeST many times but continues to required mod A to comprehend the task, instead attempting to write several independent sentences. Dquan verbalized awareness that he has done this task several times but doesn't recall how to do it. Discussion re: improved awareness as well as ongoing cognitive impairments.    5/8/24[name] initiated HW of generating paragraph including given words. He generated 3 sentences with accuracy, and will continue writing the paragraph for [facility]. Targeted reading comprehension of 3 paragraph articles in health magazine. [name] summarized and gave details re: both articles with cue to re-read when he did not know the details. Targeted complex sentence generation generating sentences with multiple meaning words - Dilon required usual min to mod to generate more than 2 meanings and to generate sentences rather than give a definition.     PATIENT EDUCATION: Education details: See patient instructions, see today's  treatment, compensations for aphasia Person educated: Patient and Spouse Education method: Explanation, Verbal cues, and Handouts Education comprehension: verbal cues required and needs further education  GOALS: Goals reviewed with patient? Yes   SHORT TERM GOALS: Target date: 01/23/23   Pt will name 10 items in personally relevant categories over 3 sessions with occasional min A Baseline: Goal status: MET   2.  Pt will write 4 item grocery list with occasional min A Baseline:  Goal status: MET   3.  Pt will respond to 3 texts with occasional min A over 1 week Baseline:  Goal status: MET   4.  Pt will generate complex verbal sentences 3x using Scientist, product/process development with rare min A over 2 sessions Baseline:  Goal status: MET   5.  Pt will comprehend 4 sentence passage/email with extended time and rare min A Baseline:  Goal status: MET   6.  Pt will employ compensations for aphasia in structured language tasks with occasional min A Baseline:  Goal status: MET   LONG TERM GOALS: Target date: 06/12/23   Pt will complete complex naming tasks with 80% accuracy and occasional min A Baseline:  Goal status: MET   2.  Pt will carryover verbal compensations for aphasia with occasional min A in conversation as needed over 2 sessions Baseline:  Goal status: MET   3.  Pt will carryover 3 strategies to successfully communicate in community with clerks, servers etc Baseline:  Goal status: MET   4.  Pt will write 3 sentence text or email with occasional min A, external aids and occasional min A  Baseline:  Goal status: MET   5.  Pt will improve score on Communicative Participation Item Bank by 4 points Baseline: 14/30 Goal status: MET  6. Pt will comprehend 3-5 paragraph story or article by accurately summarizing salient details in cohesive summary with rare min A             Goal Status: Ongoing - recert 03/20/23  7.  Pt will generate 5-7 sentence paragraph or  email, Iding and correcting errors with occasional min A              Goal Status: MET  8. Pt will generate detailed, cohesive summary of a movie, news, article show or story requiring 3 or less requests for clarification               Goal Status: Ongoing  9. Pt will carryover 2 compensations to make 3 phone calls to friends  or businesses with rare min A               Goal Status: MET  10. Pt will navigate music, photos and note section on his phone with occasional min A                Goal Status: New    ASSESSMENT:   CLINICAL IMPRESSION:  Ehtan  continues to make excellent progress and reports actively completing his HEP at home. Verbal expression for simple conversation is with mod I and mildly complex conversation with rare min A and occasional halting for word finding. Written expression at sentence level is accurate with rare min A and short paragraph level with occasional aphasic errors,  occasional mod A to ID and correct errors. However complex written sentences, and 5-6 sentence paragraphs continue to exhibit spelling, syntax and tangential errors.  Reading comprehension intact now at sentence and simple 3-4 sentence paragraph level. Ongoing extended time and occasional min to mod A for moderately complex paragraphs and short articles. Tmothy endorses that reading is slower and labored. He also cites reduced attention and processing of details Alexi verbalized that he would like to work on using the notes, photos, music in his iphone as he was proficient with this prior to CVA and cognitive impairments have affected his ability to use his phone. Pt continues to benefit from ST intervention to maximize his communication and improve QoL at home and in the community. Added goal for iphone navigation on 04/12/23   OBJECTIVE IMPAIRMENTS: include memory and aphasia. These impairments are limiting patient from managing medications, managing appointments, managing finances, household  responsibilities, ADLs/IADLs, and effectively communicating at home and in community. Factors affecting potential to achieve goals and functional outcome are ability to learn/carryover information. Patient will benefit from skilled SLP services to address above impairments and improve overall function.   REHAB POTENTIAL: Good   PLAN:   SLP FREQUENCY: 2x/week   SLP DURATION: 12 weeks   PLANNED INTERVENTIONS: Language facilitation, Environmental controls, Cueing hierachy, Cognitive reorganization, Internal/external aids, Functional tasks, and Multimodal communication approach     Genavive Kubicki, Radene Journey, CCC-SLP 05/15/2023, 3:25 PM

## 2023-05-17 ENCOUNTER — Encounter: Payer: Self-pay | Admitting: Speech Pathology

## 2023-05-17 ENCOUNTER — Ambulatory Visit: Payer: Medicare Other | Admitting: Speech Pathology

## 2023-05-17 DIAGNOSIS — R41841 Cognitive communication deficit: Secondary | ICD-10-CM

## 2023-05-17 DIAGNOSIS — R4701 Aphasia: Secondary | ICD-10-CM | POA: Diagnosis not present

## 2023-05-17 DIAGNOSIS — R310 Gross hematuria: Secondary | ICD-10-CM | POA: Diagnosis not present

## 2023-05-17 NOTE — Therapy (Signed)
OUTPATIENT SPEECH LANGUAGE PATHOLOGY TREATMENT NOTE   Patient Name: James Estrada MRN: [id number] DOB:Apr 24, 1941, 82 y.o., male Today's Date: 05/17/2023  PCP: Renford Dills, MD REFERRING PROVIDER: Renford Dills, MD  END OF SESSION:   End of Session - 05/17/23 1320     Visit Number 28    Number of Visits 50    Date for SLP Re-Evaluation 06/12/23    Authorization Type Medicare    SLP Start Time 1315    SLP Stop Time  1400    SLP Time Calculation (min) 45 min    Activity Tolerance Patient tolerated treatment well                 Past Medical History:  Diagnosis Date   AAA (abdominal aortic aneurysm) (HCC)    last u/s done 07/18/17    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) 05/22/2022   Arthritis    Bradycardia    CVA (cerebral vascular accident) (HCC) 10/04/2022   Dysrhythmia    frequent PAC, for 20 years   GERD (gastroesophageal reflux disease)    occ, OTC   Hemorrhoids    History of hiatal hernia    Hyperlipidemia    Hypertension    S/P aortic dissection repair 09/18/2022   Seasonal allergies    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) 10/26/2022   Past Surgical History:  Procedure Laterality Date   ABDOMINAL AORTIC ENDOVASCULAR STENT GRAFT N/A 05/04/2020   Procedure: ABDOMINAL AORTIC ENDOVASCULAR STENT GRAFT;  Surgeon: Larina Earthly, MD;  Location: [facility] OR;  Service: Vascular;  Laterality: N/A;   CATARACT EXTRACTION Bilateral 2009   DIAGNOSTIC LAPAROSCOPY     laparoscopic hernia repair   EYE SURGERY Bilateral    cataract removal   HERNIA REPAIR Bilateral 1999, 2006   JOINT REPLACEMENT Right ~2018   hip replacement   pheochromocytoma  1993   PROSTATECTOMY N/A 05/15/2013   Procedure: PROSTATECTOMY RETROPUBIC; SIMPLE OPEN PROSTATECTOMY;  Surgeon: Valetta Fuller, MD;  Location: [facility] ORS;  Service: Urology;  Laterality: N/A;   REPAIR OF ACUTE ASCENDING THORACIC AORTIC DISSECTION N/A 09/17/2022   Procedure: REPAIR OF ACUTE ASCENDING THORACIC AORTIC  DISSECTION USING 28 MM HEMASHIELD PLATINUM VASCULAR GRAFT;  Surgeon: Loreli Slot, MD;  Location: [facility] OR;  Service: Vascular;  Laterality: N/A;  Median sternotomy   TOTAL ELBOW REPLACEMENT Left    > 30 years ago   TOTAL HIP ARTHROPLASTY Right 09/08/2017   Procedure: RIGHT TOTAL HIP ARTHROPLASTY ANTERIOR APPROACH;  Surgeon: Kathryne Hitch, MD;  Location: [facility] ORS;  Service: Orthopedics;  Laterality: Right;   ULTRASOUND GUIDANCE FOR VASCULAR ACCESS Bilateral 05/04/2020   Procedure: ULTRASOUND GUIDANCE FOR VASCULAR ACCESS;  Surgeon: Larina Earthly, MD;  Location: [facility] OR;  Service: Vascular;  Laterality: Bilateral;   Patient Active Problem List   Diagnosis Date Noted   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) 10/26/2022   CVA (cerebral vascular accident) (HCC) 10/04/2022   Sepsis with acute renal failure without septic shock (HCC) 09/30/2022   S/P aortic dissection repair 09/18/2022   Status post surgery 09/17/2022   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) 05/22/2022   Colon cancer screening 05/22/2022   Essential hypertension 05/22/2022   Hemorrhoids 05/22/2022   Inguinal hernia 05/22/2022   Irritable bowel syndrome 05/22/2022   Mixed hyperlipidemia 05/22/2022   Pure hypercholesterolemia 05/22/2022   Thrombocytopenia (HCC) 05/22/2022   Allergic rhinitis 05/22/2022   AAA (abdominal aortic aneurysm) without rupture (HCC) 05/04/2020   Trochanteric  bursitis, right hip 08/20/2018   History of right hip replacement 08/20/2018   Status post total replacement of right hip 09/08/2017   Pain of right hip joint 08/07/2017   Unilateral primary osteoarthritis, right hip 08/07/2017   BPH (benign prostatic hypertrophy) with urinary obstruction 05/15/2013    ONSET DATE: 09/17/22  REFERRING DIAG: R13.10 (ICD-10-CM) - Dysphagia, unspecified  THERAPY DIAG:  Aphasia  Cognitive communication deficit  Rationale for Evaluation and Treatment: Rehabilitation  SUBJECTIVE:    SUBJECTIVE STATEMENT:"We have been through it" re: move into [facility] - pt took 2 weeks off due to move PAIN:  Are you having pain? No   OBJECTIVE:  Pt is a 82 y/o M admitted to [facility] on 09/17/22 with type 1 aortic dissection requiring emergent repair. Head CT 11/6 acute infarcts within R MCA involvement, including R cerebellum, basal ganglia, frontal lobe. ETT 11/4-11/9. PMH AAA s/p endovascular repair (2020), HTN, HLD, GERD.   TODAY'S TREATMENT:                                                                                                                                         DATE:   05/17/23: Targeted reading comprehension, mental math, attention to detail  Reading bank schedule/hours - Daryk required frequent mod verbal and visual cues (pointing) and written cues to attend to correct details in questions and written cues to organize mental time math. In reading comprehension, organization and attention to detail task organizing pantry to detailed instructions, Laker required frequent mod verba and visual cues. Spouse requesting worksheets - provided simple deduction sheets - practiced 1 with frequent mod A. Spouse aware Arush will need A to complete at home.  05/15/23:Darriel continues to practice accessing music, contacts and photos - most success with accessing his music library. He continues to use [employer] and written cues to add appointments to his phone calendar, writing out the information including am/pm. Targeted reading comprehension, attention to detail and mental math reading chart for basketball tickets and attending to details -Jacaleb required frequent mod verbal cues and written cues (for math) to answer questions accurately 5/5x. In organization and reading comprehension task, Mikal required frequent mod verbal and visual cues to follow instructions to organize information. He required consistent extended time for processing  05/03/23: Daylon returns after 2 weeks due to move from his  home into [facility] - independent living. Targeted attention, problem solving, sequencing navigating music on iphone. [name] required mod A, modeling and written cues to give Beckey Rutter exact command to "open my [employer]" as he told her "play music" and "open [employer]" after several practice commands, Dantae successfully opened his [employer]. He required frequent mod A to ID and select country, jazz, oldies play lists and max A to use library to find a Paramedic. Electrical engineer feature on iPhone to put frequently used applications on first page - they are to have their  son work with them on this feature. Yousaf accessed note section on his phone with frequent mod A - visual cues - his note app is complex with many folders and folders in folders. This level of detail may not be feasible. Jaedin is attempting to use Beckey Rutter to add appointments in his calendar, however he has to make many attempts to get this correct. We generated and practiced strategy of writing down the appointment, time and date including am/pm for Gilson to read this to Masury - with usual mod verbal and written cues, Boleslaw successfully added his next ST appointment after 2 attempts.  04/12/23: [name] reports he made successful phone call to a plumber re: pipe leak - he described the problem, set appointment time and date. He has also had phone calls with remote friends with success. HW completed (written sentences with 3 given words) [name] generated sentences with 2/3 given words - usual min A and extended time to ID and correct errors. Targeted narrative discourse re: cause of leak and summarizing a podcast he listened to re: Trump trial - Akiles continues to require usual clarification requests to repair communication break down at narrative level due to aphasia and processing. Sequencing events in narratives cohesively requires usual questioning cues. Extended time and halting due to word finding.   04/05/23: [name] read  sentences he wrote for home work - he noted his errors and noted when his sentences however required mod A to correct them. Narrative discourse targeted in 2 stories about when his children were born. [name] required verbal cues to ID semantic paraphasias (pediatrician/Obstetrician; peaches/strawberries; sister/aunt) and occasional questioning cues to clarify sequence and salient details. Usual halting for word finding in both narratives. VNeST to target word finding and sentence generation - using the verb "repair" Kove generated 4 subjects and objects with occasional semantic cues. He generated complex sentence with the verb with frequent semantic, questioning cues to answer and write "wh" questions - confusion noted in this activity writing sentence - mod A to ID and correct errors. Abdias named 3 couples they have plan with, 2 tonight and 1 on Saturday - 2/6 names required cues/corrections from spouse  04/03/23: [name] and Kathie Rhodes report success participating in conversation at lunch with a friend. He brings in his paragraph level homework, with 6 sentences, errors corrected . He successfully made short phone call to his eye doctor (made 1 phone call to person outside of family to date) Marlos continues to report communication breakdown due to becoming anxious when he falters or can't find a word. Targeted compensations for aphasia generating 3 similarities and 3 differences of 4 pairs or related words to train in using semantic features to describe as compensation for aphasia. Izzak required extended time consistently, frequent pausing for word finding and required usual min semantic or questioning cues to generate a total of 24 similarities and differences. In this task and conversation Juergen requires semantic cues to correct use of related word (semantic paraphasia or inaccurate substitution)  03/29/23: Discussed cognitive changes since CVA, Aijalon continues to note word finding as main difficulty.  Provided example of attention impairment when he was writing and his wife told him a word to write, he did not respond or attend to her due to cognitive load of writing a word. With verbal cues, they Id'd that divided attention is affected. Provided strategies to compensate for attention and processing when information is important. Targeted written expression in complex naming task- Lakai required occasional mod verbal cues to  ID and correct written errors at word level. Demonstrated strategy of pre-planning names and topics before their lunch with a friend. Laikyn generated 3 family members of the friend they are having lunch with that he will ask about - we wrote the names on the white board, Kathie Rhodes named 2 more family of this friend and added it to white board. Ralf generated 2 topics he can talk to their friend about ([employer] scholarship and grandson's summer plans)- which were also added to board as written cues.   5/13/24[name] completed homework of adding on to paragraph with given words to generate 7 sentence paragraph of a cohesive story.  To target written expression, word finding, and complex sentence generation,   Scientist, product/process development (VNeST) was utilized. The pt generated 3 subjects and objects for 2 verbs (prepare, defend), for a total of 6 subject objects. Pt required occasional min cues. Pt generated 2 complex sentences by answering "wh" questions. Pt required occasional min cues to generate complex sentences. Kershaw requires usual min to mod A to ID and self correct errors. He is improving awareness that he has completed VNeST many times but continues to required mod A to comprehend the task, instead attempting to write several independent sentences. Renso verbalized awareness that he has done this task several times but doesn't recall how to do it. Discussion re: improved awareness as well as ongoing cognitive impairments.      PATIENT EDUCATION: Education details: See  patient instructions, see today's treatment, compensations for aphasia Person educated: Patient and Spouse Education method: Explanation, Verbal cues, and Handouts Education comprehension: verbal cues required and needs further education  GOALS: Goals reviewed with patient? Yes   SHORT TERM GOALS: Target date: 01/23/23   Pt will name 10 items in personally relevant categories over 3 sessions with occasional min A Baseline: Goal status: MET   2.  Pt will write 4 item grocery list with occasional min A Baseline:  Goal status: MET   3.  Pt will respond to 3 texts with occasional min A over 1 week Baseline:  Goal status: MET   4.  Pt will generate complex verbal sentences 3x using Scientist, product/process development with rare min A over 2 sessions Baseline:  Goal status: MET   5.  Pt will comprehend 4 sentence passage/email with extended time and rare min A Baseline:  Goal status: MET   6.  Pt will employ compensations for aphasia in structured language tasks with occasional min A Baseline:  Goal status: MET   LONG TERM GOALS: Target date: 06/12/23   Pt will complete complex naming tasks with 80% accuracy and occasional min A Baseline:  Goal status: MET   2.  Pt will carryover verbal compensations for aphasia with occasional min A in conversation as needed over 2 sessions Baseline:  Goal status: MET   3.  Pt will carryover 3 strategies to successfully communicate in community with clerks, servers etc Baseline:  Goal status: MET   4.  Pt will write 3 sentence text or email with occasional min A, external aids and occasional min A  Baseline:  Goal status: MET   5.  Pt will improve score on Communicative Participation Item Bank by 4 points Baseline: 14/30 Goal status: MET  6. Pt will comprehend 3-5 paragraph story or article by accurately summarizing salient details in cohesive summary with rare min A             Goal Status: Ongoing - recert  03/20/23  7. Pt will  generate 5-7 sentence paragraph or email, Iding and correcting errors with occasional min A              Goal Status: MET  8. Pt will generate detailed, cohesive summary of a movie, news, article show or story requiring 3 or less requests for clarification               Goal Status: Ongoing  9. Pt will carryover 2 compensations to make 3 phone calls to friends  or businesses with rare min A               Goal Status: MET  10. Pt will navigate music, photos and note section on his phone with occasional min A                Goal Status: New    ASSESSMENT:   CLINICAL IMPRESSION:  Jayjuan  continues to make excellent progress and reports actively completing his HEP at home. Verbal expression for simple conversation is with mod I and mildly complex conversation with rare min A and occasional halting for word finding. Written expression at sentence level is accurate with rare min A and short paragraph level with occasional aphasic errors,  occasional mod A to ID and correct errors. However complex written sentences, and 5-6 sentence paragraphs continue to exhibit spelling, syntax and tangential errors.  Reading comprehension intact now at sentence and simple 3-4 sentence paragraph level. Ongoing extended time and occasional min to mod A for moderately complex paragraphs and short articles. Koree endorses that reading is slower and labored. He also cites reduced attention and processing of details Tazz verbalized that he would like to work on using the notes, photos, music in his iphone as he was proficient with this prior to CVA and cognitive impairments have affected his ability to use his phone. Pt continues to benefit from ST intervention to maximize his communication and improve QoL at home and in the community. Added goal for iphone navigation on 04/12/23   OBJECTIVE IMPAIRMENTS: include memory and aphasia. These impairments are limiting patient from managing medications, managing appointments,  managing finances, household responsibilities, ADLs/IADLs, and effectively communicating at home and in community. Factors affecting potential to achieve goals and functional outcome are ability to learn/carryover information. Patient will benefit from skilled SLP services to address above impairments and improve overall function.   REHAB POTENTIAL: Good   PLAN:   SLP FREQUENCY: 2x/week   SLP DURATION: 12 weeks   PLANNED INTERVENTIONS: Language facilitation, Environmental controls, Cueing hierachy, Cognitive reorganization, Internal/external aids, Functional tasks, and Multimodal communication approach     Nikky Duba, Radene Journey, CCC-SLP 05/17/2023, 3:07 PM

## 2023-05-24 ENCOUNTER — Encounter: Payer: Self-pay | Admitting: Speech Pathology

## 2023-05-24 ENCOUNTER — Ambulatory Visit: Payer: Medicare Other | Admitting: Speech Pathology

## 2023-05-24 DIAGNOSIS — R4701 Aphasia: Secondary | ICD-10-CM

## 2023-05-24 DIAGNOSIS — R41841 Cognitive communication deficit: Secondary | ICD-10-CM

## 2023-05-24 NOTE — Therapy (Signed)
OUTPATIENT SPEECH LANGUAGE PATHOLOGY TREATMENT NOTE   Patient Name: James Estrada MRN: [id number] DOB:[birth year], 82 y.o., male Today's Date: 05/24/2023  PCP: James Dills, MD REFERRING PROVIDER: Renford Dills, MD  END OF SESSION:   End of Session - 05/24/23 1407     Visit Number 29    Number of Visits 50    Date for SLP Re-Evaluation 06/12/23    Authorization Type Medicare    SLP Start Time 1400    SLP Stop Time  1445    SLP Time Calculation (min) 45 min    Activity Tolerance Patient tolerated treatment well                 Past Medical History:  Diagnosis Date   AAA (abdominal aortic aneurysm) (HCC)    last u/s done 07/18/17    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) 05/22/2022   Arthritis    Bradycardia    CVA (cerebral vascular accident) (HCC) 10/04/2022   Dysrhythmia    frequent PAC, for 20 years   GERD (gastroesophageal reflux disease)    occ, OTC   Hemorrhoids    History of hiatal hernia    Hyperlipidemia    Hypertension    S/P aortic dissection repair 09/18/2022   Seasonal allergies    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) 10/26/2022   Past Surgical History:  Procedure Laterality Date   ABDOMINAL AORTIC ENDOVASCULAR STENT GRAFT N/A 05/04/2020   Procedure: ABDOMINAL AORTIC ENDOVASCULAR STENT GRAFT;  Surgeon: James Earthly, MD;  Location: [facility] OR;  Service: Vascular;  Laterality: N/A;   CATARACT EXTRACTION Bilateral 2009   DIAGNOSTIC LAPAROSCOPY     laparoscopic hernia repair   EYE SURGERY Bilateral    cataract removal   HERNIA REPAIR Bilateral 1999, 2006   JOINT REPLACEMENT Right ~2018   hip replacement   pheochromocytoma  1993   PROSTATECTOMY N/A 05/15/2013   Procedure: PROSTATECTOMY RETROPUBIC; SIMPLE OPEN PROSTATECTOMY;  Surgeon: James Fuller, MD;  Location: [facility] OR[facility];  Service: Urology;  Laterality: N/A;   REPAIR OF ACUTE ASCENDING THORACIC AORTIC DISSECTION N/A 09/17/2022   Procedure: REPAIR OF ACUTE ASCENDING THORACIC AORTIC  DISSECTION USING 28 MM HEMASHIELD PLATINUM VASCULAR GRAFT;  Surgeon: James Slot, MD;  Location: [facility] OR;  Service: Vascular;  Laterality: N/A;  Median sternotomy   TOTAL ELBOW REPLACEMENT Left    > 30 years ago   TOTAL HIP ARTHROPLASTY Right 09/08/2017   Procedure: RIGHT TOTAL HIP ARTHROPLASTY ANTERIOR APPROACH;  Surgeon: James Hitch, MD;  Location: [facility];  Service: Orthopedics;  Laterality: Right;   ULTRASOUND GUIDANCE FOR VASCULAR ACCESS Bilateral 05/04/2020   Procedure: ULTRASOUND GUIDANCE FOR VASCULAR ACCESS;  Surgeon: James Earthly, MD;  Location: [facility] OR;  Service: Vascular;  Laterality: Bilateral;   Patient Active Problem List   Diagnosis Date Noted   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) 10/26/2022   CVA (cerebral vascular accident) (HCC) 10/04/2022   Sepsis with acute renal failure without septic shock (HCC) 09/30/2022   S/P aortic dissection repair 09/18/2022   Status post surgery 09/17/2022   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) 05/22/2022   Colon cancer screening 05/22/2022   Essential hypertension 05/22/2022   Hemorrhoids 05/22/2022   Inguinal hernia 05/22/2022   Irritable bowel syndrome 05/22/2022   Mixed hyperlipidemia 05/22/2022   Pure hypercholesterolemia 05/22/2022   Thrombocytopenia (HCC) 05/22/2022   Allergic rhinitis 05/22/2022   AAA (abdominal aortic aneurysm) without rupture (HCC) 05/04/2020   Trochanteric  bursitis, right hip 08/20/2018   History of right hip replacement 08/20/2018   Status post total replacement of right hip 09/08/2017   Pain of right hip joint 08/07/2017   Unilateral primary osteoarthritis, right hip 08/07/2017   BPH (benign prostatic hypertrophy) with urinary obstruction 05/15/2013    ONSET DATE: 09/17/22  REFERRING DIAG: R13.10 (ICD-10-CM) - Dysphagia, unspecified  THERAPY DIAG:  Cognitive communication deficit  Aphasia  Rationale for Evaluation and Treatment: Rehabilitation  SUBJECTIVE:    SUBJECTIVE STATEMENT:"We have been through it" re: move into [facility] - pt took 2 weeks off due to move PAIN:  Are you having pain? No   OBJECTIVE:  Pt is a 82 y/o M admitted to [facility] on 09/17/22 with type 1 aortic dissection requiring emergent repair. Head CT 11/6 acute infarcts within R MCA involvement, including R cerebellum, basal ganglia, frontal lobe. ETT 11/4-11/9. PMH AAA s/p endovascular repair (2020), HTN, HLD, GERD.   TODAY'S TREATMENT:                                                                                                                                         DATE:   05/24/23: Reading comprehension, organization, attention to detail targeted following complex written instructions to fill out a chart/log with frequent mod A and extended time. James Estrada benefited from cues to cross off each direction after he completed it. He requires consistent extra processing time. Targeted reasoning, attention and recall with card sort 2 piles, 2 rules - again extended time needed consistently, as well as frequent verbal cues and visual cues.   05/17/23: Targeted reading comprehension, mental math, attention to detail  Reading bank schedule/hours - James Estrada required frequent mod verbal and visual cues (pointing) and written cues to attend to correct details in questions and written cues to organize mental time math. In reading comprehension, organization and attention to detail task organizing pantry to detailed instructions, James Estrada required frequent mod verba and visual cues. Spouse requesting worksheets - provided simple deduction sheets - practiced 1 with frequent mod A. Spouse aware James Estrada will need A to complete at home.  05/15/23:James Estrada continues to practice accessing music, contacts and photos - most success with accessing his music library. He continues to use James Estrada and written cues to add appointments to his phone calendar, writing out the information including am/pm. Targeted reading  comprehension, attention to detail and mental math reading chart for basketball tickets and attending to details -James Estrada required frequent mod verbal cues and written cues (for math) to answer questions accurately 5/5x. In organization and reading comprehension task, Lazarus required frequent mod verbal and visual cues to follow instructions to organize information. He required consistent extended time for processing  05/03/23: Ryane returns after 2 weeks due to move from his home into [facility] - independent living. Targeted attention, problem solving, sequencing navigating music on iphone. [name] required mod A, modeling and written cues to give  Siri exact command to "open my [employer]" as he told her "play music" and "open [employer]" after several practice commands, Irving successfully opened his [employer]. He required frequent mod A to ID and select country, jazz, oldies play lists and max A to use library to find a Paramedic. Electrical engineer feature on iPhone to put frequently used applications on first page - they are to have their son work with them on this feature. Marc accessed note section on his phone with frequent mod A - visual cues - his note app is complex with many folders and folders in folders. This level of detail may not be feasible. Jonovan is attempting to use Beckey Rutter to add appointments in his calendar, however he has to make many attempts to get this correct. We generated and practiced strategy of writing down the appointment, time and date including am/pm for Danthony to read this to [place] - with usual mod verbal and written cues, Reynold successfully added his next ST appointment after 2 attempts.  04/12/23: [name] reports he made successful phone call to a plumber re: pipe leak - he described the problem, set appointment time and date. He has also had phone calls with remote friends with success. HW completed (written sentences with 3 given words) [name] generated  sentences with 2/3 given words - usual min A and extended time to ID and correct errors. Targeted narrative discourse re: cause of leak and summarizing a podcast he listened to re: Trump trial - Rashied continues to require usual clarification requests to repair communication break down at narrative level due to aphasia and processing. Sequencing events in narratives cohesively requires usual questioning cues. Extended time and halting due to word finding.   04/05/23: [name] read sentences he wrote for home work - he noted his errors and noted when his sentences however required mod A to correct them. Narrative discourse targeted in 2 stories about when his children were born. [name] required verbal cues to ID semantic paraphasias (pediatrician/Obstetrician; peaches/strawberries; sister/aunt) and occasional questioning cues to clarify sequence and salient details. Usual halting for word finding in both narratives. VNeST to target word finding and sentence generation - using the verb "repair" Bayard generated 4 subjects and objects with occasional semantic cues. He generated complex sentence with the verb with frequent semantic, questioning cues to answer and write "wh" questions - confusion noted in this activity writing sentence - mod A to ID and correct errors. Falon named 3 couples they have plan with, 2 tonight and 1 on Saturday - 2/6 names required cues/corrections from spouse  04/03/23: [name] and Kathie Rhodes report success participating in conversation at lunch with a friend. He brings in his paragraph level homework, with 6 sentences, errors corrected . He successfully made short phone call to his eye doctor (made 1 phone call to person outside of family to date) Moataz continues to report communication breakdown due to becoming anxious when he falters or can't find a word. Targeted compensations for aphasia generating 3 similarities and 3 differences of 4 pairs or related words to train in using semantic  features to describe as compensation for aphasia. Nery required extended time consistently, frequent pausing for word finding and required usual min semantic or questioning cues to generate a total of 24 similarities and differences. In this task and conversation Kingdavid requires semantic cues to correct use of related word (semantic paraphasia or inaccurate substitution)  03/29/23: Discussed cognitive changes since CVA, Ramzi continues to note word finding as  main difficulty. Provided example of attention impairment when he was writing and his wife told him a word to write, he did not respond or attend to her due to cognitive load of writing a word. With verbal cues, they Id'd that divided attention is affected. Provided strategies to compensate for attention and processing when information is important. Targeted written expression in complex naming task- Vershawn required occasional mod verbal cues to ID and correct written errors at word level. Demonstrated strategy of pre-planning names and topics before their lunch with a friend. Zackarie generated 3 family members of the friend they are having lunch with that he will ask about - we wrote the names on the white board, Kathie Rhodes named 2 more family of this friend and added it to white board. Cash generated 2 topics he can talk to their friend about ([employer] scholarship and grandson's summer plans)- which were also added to board as written cues.   5/13/24[name] completed homework of adding on to paragraph with given words to generate 7 sentence paragraph of a cohesive story.  To target written expression, word finding, and complex sentence generation,   Scientist, product/process development (VNeST) was utilized. The pt generated 3 subjects and objects for 2 verbs (prepare, defend), for a total of 6 subject objects. Pt required occasional min cues. Pt generated 2 complex sentences by answering "wh" questions. Pt required occasional min cues to generate complex  sentences. Brantley requires usual min to mod A to ID and self correct errors. He is improving awareness that he has completed VNeST many times but continues to required mod A to comprehend the task, instead attempting to write several independent sentences. Khyre verbalized awareness that he has done this task several times but doesn't recall how to do it. Discussion re: improved awareness as well as ongoing cognitive impairments.      PATIENT EDUCATION: Education details: See patient instructions, see today's treatment, compensations for aphasia Person educated: Patient and Spouse Education method: Explanation, Verbal cues, and Handouts Education comprehension: verbal cues required and needs further education  GOALS: Goals reviewed with patient? Yes   SHORT TERM GOALS: Target date: 01/23/23   Pt will name 10 items in personally relevant categories over 3 sessions with occasional min A Baseline: Goal status: MET   2.  Pt will write 4 item grocery list with occasional min A Baseline:  Goal status: MET   3.  Pt will respond to 3 texts with occasional min A over 1 week Baseline:  Goal status: MET   4.  Pt will generate complex verbal sentences 3x using Scientist, product/process development with rare min A over 2 sessions Baseline:  Goal status: MET   5.  Pt will comprehend 4 sentence passage/email with extended time and rare min A Baseline:  Goal status: MET   6.  Pt will employ compensations for aphasia in structured language tasks with occasional min A Baseline:  Goal status: MET   LONG TERM GOALS: Target date: 06/12/23   Pt will complete complex naming tasks with 80% accuracy and occasional min A Baseline:  Goal status: MET   2.  Pt will carryover verbal compensations for aphasia with occasional min A in conversation as needed over 2 sessions Baseline:  Goal status: MET   3.  Pt will carryover 3 strategies to successfully communicate in community with clerks, servers  etc Baseline:  Goal status: MET   4.  Pt will write 3 sentence text or email with occasional min  A, external aids and occasional min A  Baseline:  Goal status: MET   5.  Pt will improve score on Communicative Participation Item Bank by 4 points Baseline: 14/30 Goal status: MET  6. Pt will comprehend 3-5 paragraph story or article by accurately summarizing salient details in cohesive summary with rare min A             Goal Status: Ongoing - recert 03/20/23  7. Pt will generate 5-7 sentence paragraph or email, Iding and correcting errors with occasional min A              Goal Status: MET  8. Pt will generate detailed, cohesive summary of a movie, news, article show or story requiring 3 or less requests for clarification               Goal Status: Ongoing  9. Pt will carryover 2 compensations to make 3 phone calls to friends  or businesses with rare min A               Goal Status: MET  10. Pt will navigate music, photos and note section on his phone with occasional min A                Goal Status: New    ASSESSMENT:   CLINICAL IMPRESSION:  Jarred  continues to make excellent progress and reports actively completing his HEP at home. Verbal expression for simple conversation is with mod I and mildly complex conversation with rare min A and occasional halting for word finding. Written expression at sentence level is accurate with rare min A and short paragraph level with occasional aphasic errors,  occasional mod A to ID and correct errors. However complex written sentences, and 5-6 sentence paragraphs continue to exhibit spelling, syntax and tangential errors.  Reading comprehension intact now at sentence and simple 3-4 sentence paragraph level. Ongoing extended time and occasional min to mod A for moderately complex paragraphs and short articles. Tylique endorses that reading is slower and labored. He also cites reduced attention and processing of details Atticus verbalized that he would  like to work on using the notes, photos, music in his iphone as he was proficient with this prior to CVA and cognitive impairments have affected his ability to use his phone. Pt continues to benefit from ST intervention to maximize his communication and improve QoL at home and in the community. Added goal for iphone navigation on 04/12/23   OBJECTIVE IMPAIRMENTS: include memory and aphasia. These impairments are limiting patient from managing medications, managing appointments, managing finances, household responsibilities, ADLs/IADLs, and effectively communicating at home and in community. Factors affecting potential to achieve goals and functional outcome are ability to learn/carryover information. Patient will benefit from skilled SLP services to address above impairments and improve overall function.   REHAB POTENTIAL: Good   PLAN:   SLP FREQUENCY: 2x/week   SLP DURATION: 12 weeks   PLANNED INTERVENTIONS: Language facilitation, Environmental controls, Cueing hierachy, Cognitive reorganization, Internal/external aids, Functional tasks, and Multimodal communication approach     Latron Ribas, Radene Journey, CCC-SLP 05/24/2023, 2:54 PM

## 2023-05-25 ENCOUNTER — Ambulatory Visit: Payer: Medicare Other | Admitting: Neurology

## 2023-05-29 ENCOUNTER — Ambulatory Visit: Payer: Medicare Other | Admitting: Speech Pathology

## 2023-05-29 DIAGNOSIS — R4701 Aphasia: Secondary | ICD-10-CM | POA: Diagnosis not present

## 2023-05-29 DIAGNOSIS — R41841 Cognitive communication deficit: Secondary | ICD-10-CM

## 2023-05-29 NOTE — Therapy (Signed)
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) 05/22/2022   Arthritis    Bradycardia    CVA (cerebral vascular accident) (HCC) 10/04/2022   Dysrhythmia    frequent PAC, for 20 years   GERD (gastroesophageal reflux disease)    occ, OTC   Hemorrhoids    History of hiatal hernia    Hyperlipidemia    Hypertension    S/P aortic dissection repair 09/18/2022   Seasonal allergies    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) 10/26/2022   Past Surgical History:  Procedure Laterality Date   ABDOMINAL AORTIC ENDOVASCULAR STENT GRAFT N/A 05/04/2020   Procedure: ABDOMINAL AORTIC ENDOVASCULAR STENT GRAFT;  Surgeon: Larina Earthly, MD;  Location: [facility] OR;  Service: Vascular;  Laterality: N/A;   CATARACT EXTRACTION Bilateral 2009   DIAGNOSTIC LAPAROSCOPY     laparoscopic hernia repair   EYE SURGERY Bilateral    cataract removal   HERNIA REPAIR Bilateral 1999, 2006   JOINT REPLACEMENT Right ~2018   hip replacement   pheochromocytoma  1993   PROSTATECTOMY N/A 05/15/2013   Procedure: PROSTATECTOMY RETROPUBIC; SIMPLE OPEN PROSTATECTOMY;  Surgeon: Valetta Fuller, MD;  Location: [facility] ORS;  Service: Urology;  Laterality: N/A;   REPAIR OF ACUTE ASCENDING THORACIC AORTIC DISSECTION N/A 09/17/2022   Procedure: REPAIR OF ACUTE ASCENDING THORACIC AORTIC DISSECTION USING 28 MM HEMASHIELD PLATINUM VASCULAR GRAFT;   Surgeon: Loreli Slot, MD;  Location: [facility] OR;  Service: Vascular;  Laterality: N/A;  Median sternotomy   TOTAL ELBOW REPLACEMENT Left    > 30 years ago   TOTAL HIP ARTHROPLASTY Right 09/08/2017   Procedure: RIGHT TOTAL HIP ARTHROPLASTY ANTERIOR APPROACH;  Surgeon: Kathryne Hitch, MD;  Location: [facility] ORS;  Service: Orthopedics;  Laterality: Right;   ULTRASOUND GUIDANCE FOR VASCULAR ACCESS Bilateral 05/04/2020   Procedure: ULTRASOUND GUIDANCE FOR VASCULAR ACCESS;  Surgeon: Larina Earthly, MD;  Location: [facility] OR;  Service: Vascular;  Laterality: Bilateral;   Patient Active Problem List   Diagnosis Date Noted   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) 10/26/2022   CVA (cerebral vascular accident) (HCC) 10/04/2022   Sepsis with acute renal failure without septic shock (HCC) 09/30/2022   S/P aortic dissection repair 09/18/2022   Status post surgery 09/17/2022   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) 05/22/2022   Colon cancer screening 05/22/2022   Essential hypertension 05/22/2022   Hemorrhoids 05/22/2022   Inguinal hernia 05/22/2022   Irritable bowel syndrome 05/22/2022   Mixed hyperlipidemia 05/22/2022   Pure hypercholesterolemia 05/22/2022   Thrombocytopenia (HCC) 05/22/2022   Allergic rhinitis 05/22/2022   AAA (abdominal aortic aneurysm) without rupture (HCC) 05/04/2020   Trochanteric bursitis, right hip 08/20/2018   History of right  hip replacement 08/20/2018   Status post total replacement of right hip 09/08/2017   Pain of right hip joint 08/07/2017   Unilateral primary osteoarthritis, right hip 08/07/2017   BPH (benign prostatic hypertrophy) with urinary obstruction 05/15/2013    ONSET DATE: 09/17/22  REFERRING DIAG: R13.10 (ICD-10-CM) - Dysphagia, unspecified  THERAPY DIAG:  Cognitive communication deficit  Rationale for Evaluation and Treatment: Rehabilitation  SUBJECTIVE:   SUBJECTIVE STATEMENT: "Homework went well, we enjoyed it."   PAIN:  Are you  having pain? No   OBJECTIVE:  Pt is a 82 y/o M admitted to [facility] on 09/17/22 with type 1 aortic dissection requiring emergent repair. Head CT 11/6 acute infarcts within R MCA involvement, including R cerebellum, basal ganglia, frontal lobe. ETT 11/4-11/9. PMH AAA s/p endovascular repair (2020), HTN, HLD, GERD.    Speech Therapy Progress Note  Dates of Reporting Period: 12/26/22 to 05/29/23  Objective Reports of Subjective Statement: Aphasia improved to moderately complex conversation, halting mild, aphasia mild. Cognitive impairments evident as aphasia resolves - including slow processing, reading comprehension, organization.  Objective Measurements: Following written instructions - moderately complex 50% without A, 75% with frequent mod A  Goal Update: Added cognitive goals for reading, organization  Plan: Continue skilled ST per POC,   Reason Skilled Services are Required: As aphasia resolves, cognitive communication impairments are evident. Attention, processing, organization difficulties are noted by spouse in ADL and IADLs.  TODAY'S TREATMENT:                                                                                                                                         DATE:   05/29/23: Targeted organization, reading comprehension, and following complex directions through mental math. SLP provided occasional min-A of guiding questions and reminders to check back over directions to increase attention to detail. Patient was able to follow complex directions with 20% accuracy independently, but could increase to 40% when provided with frequent max-A of additional processing time, repetition and simplification of directions, and immediate feedback. Patient struggled with cardinal directions during today's session. Targeted reasoning, and organizing with card sort 3 piles, 3 rules, patient required extended processing time, and frequent reminders, verbal and visual cues. HEP of rhyming and  charts to increase cognitive flexibility.   05/24/23: Reading comprehension, organization, attention to detail targeted following complex written instructions to fill out a chart/log with frequent mod A and extended time. Elia benefited from cues to cross off each direction after he completed it. He requires consistent extra processing time. Targeted reasoning, attention and recall with card sort 2 piles, 2 rules - again extended time needed consistently, as well as frequent verbal cues and visual cues.   05/17/23: Targeted reading comprehension, mental math, attention to detail  Reading bank schedule/hours - Bless required frequent mod verbal and visual cues (pointing) and written cues to attend to correct details in questions and written cues  to organize mental time math. In reading comprehension, organization and attention to detail task organizing pantry to detailed instructions, Supreme required frequent mod verbal and visual cues. Spouse requesting worksheets - provided simple deduction sheets - practiced 1 with frequent mod A. Spouse aware Abbas will need A to complete at home.  05/15/23:Orlanda continues to practice accessing music, contacts and photos - most success with accessing his music library. He continues to use [employer] and written cues to add appointments to his phone calendar, writing out the information including am/pm. Targeted reading comprehension, attention to detail and mental math reading chart for basketball tickets and attending to details -Lancelot required frequent mod verbal cues and written cues (for math) to answer questions accurately 5/5x. In organization and reading comprehension task, Adyen required frequent mod verbal and visual cues to follow instructions to organize information. He required consistent extended time for processing  05/03/23: Adeyemi returns after 2 weeks due to move from his home into [facility] - independent living. Targeted attention, problem solving, sequencing  navigating music on iphone. [name] required mod A, modeling and written cues to give Beckey Rutter exact command to "open my [employer]" as he told her "play music" and "open [employer]" after several practice commands, Ladon successfully opened his [employer]. He required frequent mod A to ID and select country, jazz, oldies play lists and max A to use library to find a Paramedic. Electrical engineer feature on iPhone to put frequently used applications on first page - they are to have their son work with them on this feature. Claudell accessed note section on his phone with frequent mod A - visual cues - his note app is complex with many folders and folders in folders. This level of detail may not be feasible. Dorrell is attempting to use Beckey Rutter to add appointments in his calendar, however he has to make many attempts to get this correct. We generated and practiced strategy of writing down the appointment, time and date including am/pm for Treson to read this to [place] - with usual mod verbal and written cues, Yichen successfully added his next ST appointment after 2 attempts.  04/12/23: [name] reports he made successful phone call to a plumber re: pipe leak - he described the problem, set appointment time and date. He has also had phone calls with remote friends with success. HW completed (written sentences with 3 given words) [name] generated sentences with 2/3 given words - usual min A and extended time to ID and correct errors. Targeted narrative discourse re: cause of leak and summarizing a podcast he listened to re: Trump trial - Rayder continues to require usual clarification requests to repair communication break down at narrative level due to aphasia and processing. Sequencing events in narratives cohesively requires usual questioning cues. Extended time and halting due to word finding.   04/05/23: [name] read sentences he wrote for home work - he noted his errors and noted when his sentences  however required mod A to correct them. Narrative discourse targeted in 2 stories about when his children were born. [name] required verbal cues to ID semantic paraphasias (pediatrician/Obstetrician; peaches/strawberries; sister/aunt) and occasional questioning cues to clarify sequence and salient details. Usual halting for word finding in both narratives. VNeST to target word finding and sentence generation - using the verb "repair" Murrel generated 4 subjects and objects with occasional semantic cues. He generated complex sentence with the verb with frequent semantic, questioning cues to answer and write "wh" questions - confusion noted in  this activity writing sentence - mod A to ID and correct errors. Ferlando named 3 couples they have plan with, 2 tonight and 1 on Saturday - 2/6 names required cues/corrections from spouse  04/03/23: [name] and Kathie Rhodes report success participating in conversation at lunch with a friend. He brings in his paragraph level homework, with 6 sentences, errors corrected . He successfully made short phone call to his eye doctor (made 1 phone call to person outside of family to date) Guilherme continues to report communication breakdown due to becoming anxious when he falters or can't find a word. Targeted compensations for aphasia generating 3 similarities and 3 differences of 4 pairs or related words to train in using semantic features to describe as compensation for aphasia. Romin required extended time consistently, frequent pausing for word finding and required usual min semantic or questioning cues to generate a total of 24 similarities and differences. In this task and conversation Reynaldo requires semantic cues to correct use of related word (semantic paraphasia or inaccurate substitution)  03/29/23: Discussed cognitive changes since CVA, Dale continues to note word finding as main difficulty. Provided example of attention impairment when he was writing and his wife told him a word  to write, he did not respond or attend to her due to cognitive load of writing a word. With verbal cues, they Id'd that divided attention is affected. Provided strategies to compensate for attention and processing when information is important. Targeted written expression in complex naming task- Oiva required occasional mod verbal cues to ID and correct written errors at word level. Demonstrated strategy of pre-planning names and topics before their lunch with a friend. Torez generated 3 family members of the friend they are having lunch with that he will ask about - we wrote the names on the white board, Kathie Rhodes named 2 more family of this friend and added it to white board. Moise generated 2 topics he can talk to their friend about ([employer] scholarship and grandson's summer plans)- which were also added to board as written cues.   5/13/24[name] completed homework of adding on to paragraph with given words to generate 7 sentence paragraph of a cohesive story.  To target written expression, word finding, and complex sentence generation,   Scientist, product/process development (VNeST) was utilized. The pt generated 3 subjects and objects for 2 verbs (prepare, defend), for a total of 6 subject objects. Pt required occasional min cues. Pt generated 2 complex sentences by answering "wh" questions. Pt required occasional min cues to generate complex sentences. Hector requires usual min to mod A to ID and self correct errors. He is improving awareness that he has completed VNeST many times but continues to required mod A to comprehend the task, instead attempting to write several independent sentences. Jaesean verbalized awareness that he has done this task several times but doesn't recall how to do it. Discussion re: improved awareness as well as ongoing cognitive impairments.      PATIENT EDUCATION: Education details: See patient instructions, see today's treatment, compensations for aphasia Person educated:  Patient and Spouse Education method: Explanation, Verbal cues, and Handouts Education comprehension: verbal cues required and needs further education  GOALS: Goals reviewed with patient? Yes   SHORT TERM GOALS: Target date: 01/23/23   Pt will name 10 items in personally relevant categories over 3 sessions with occasional min A Baseline: Goal status: MET   2.  Pt will write 4 item grocery list with occasional min A Baseline:  Goal status:  MET   3.  Pt will respond to 3 texts with occasional min A over 1 week Baseline:  Goal status: MET   4.  Pt will generate complex verbal sentences 3x using Scientist, product/process development with rare min A over 2 sessions Baseline:  Goal status: MET   5.  Pt will comprehend 4 sentence passage/email with extended time and rare min A Baseline:  Goal status: MET   6.  Pt will employ compensations for aphasia in structured language tasks with occasional min A Baseline:  Goal status: MET   LONG TERM GOALS: Target date: 06/12/23   Pt will complete complex naming tasks with 80% accuracy and occasional min A Baseline:  Goal status: MET   2.  Pt will carryover verbal compensations for aphasia with occasional min A in conversation as needed over 2 sessions Baseline:  Goal status: MET   3.  Pt will carryover 3 strategies to successfully communicate in community with clerks, servers etc Baseline:  Goal status: MET   4.  Pt will write 3 sentence text or email with occasional min A, external aids and occasional min A  Baseline:  Goal status: MET   5.  Pt will improve score on Communicative Participation Item Bank by 4 points Baseline: 14/30 Goal status: MET  6. Pt will comprehend 3-5 paragraph story or article by accurately summarizing salient details in cohesive summary with rare min A             Goal Status: Ongoing - recert 03/20/23  7. Pt will generate 5-7 sentence paragraph or email, Iding and correcting errors with occasional min A               Goal Status: MET  8. Pt will generate detailed, cohesive summary of a movie, news, article show or story requiring 3 or less requests for clarification               Goal Status: Ongoing  9. Pt will carryover 2 compensations to make 3 phone calls to friends  or businesses with rare min A               Goal Status: MET  10. Pt will navigate music, photos and note section on his phone with occasional min A                Goal Status: On going  11. Pt will follow moderately complex written instructions/directions with occasional min A 8/10                 Goal Status: New  12. Pt will complex moderately complex organization, reasoning and deduction tasks with 80% accuracy and occasional min A                             ASSESSMENT:   CLINICAL IMPRESSION:  Perkins  continues to make excellent progress and reports actively completing his HEP at home. Verbal expression for simple conversation is with mod I and mildly complex conversation with rare min A and occasional halting for word finding. Written expression at sentence level is accurate with rare min A and short paragraph level with occasional aphasic errors,  occasional mod A to ID and correct errors. However complex written sentences, and 5-6 sentence paragraphs continue to exhibit spelling, syntax and tangential errors.  Reading comprehension intact now at sentence and simple 3-4 sentence paragraph level. Ongoing extended time and occasional min to  mod A for moderately complex paragraphs and short articles. Fenix endorses that reading is slower and labored. He also cites reduced attention and processing of details Ivie verbalized that he would like to work on using the notes, photos, music in his iphone as he was proficient with this prior to CVA and cognitive impairments have affected his ability to use his phone. Pt continues to benefit from ST intervention to maximize his communication and improve QoL at home and in the  community. Added goal for iphone navigation on 04/12/23   OBJECTIVE IMPAIRMENTS: include memory and aphasia. These impairments are limiting patient from managing medications, managing appointments, managing finances, household responsibilities, ADLs/IADLs, and effectively communicating at home and in community. Factors affecting potential to achieve goals and functional outcome are ability to learn/carryover information. Patient will benefit from skilled SLP services to address above impairments and improve overall function.   REHAB POTENTIAL: Good   PLAN:   SLP FREQUENCY: 2x/week   SLP DURATION: 12 weeks   PLANNED INTERVENTIONS: Language facilitation, Environmental controls, Cueing hierachy, Cognitive reorganization, Internal/external aids, Functional tasks, and Multimodal communication approach     Lovvorn, Radene Journey, CCC-SLP 05/29/2023, 3:00 PM

## 2023-05-30 DIAGNOSIS — R339 Retention of urine, unspecified: Secondary | ICD-10-CM | POA: Diagnosis not present

## 2023-05-31 ENCOUNTER — Ambulatory Visit: Payer: Medicare Other | Admitting: Speech Pathology

## 2023-05-31 DIAGNOSIS — R41841 Cognitive communication deficit: Secondary | ICD-10-CM | POA: Diagnosis not present

## 2023-05-31 DIAGNOSIS — R4701 Aphasia: Secondary | ICD-10-CM | POA: Diagnosis not present

## 2023-05-31 NOTE — Therapy (Signed)
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) 05/22/2022   Arthritis    Bradycardia    CVA (cerebral vascular accident) (HCC) 10/04/2022   Dysrhythmia    frequent PAC, for 20 years   GERD (gastroesophageal reflux disease)    occ, OTC   Hemorrhoids    History of hiatal hernia    Hyperlipidemia    Hypertension    S/P aortic dissection repair 09/18/2022   Seasonal allergies    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) 10/26/2022   Past Surgical History:  Procedure Laterality Date   ABDOMINAL AORTIC ENDOVASCULAR STENT GRAFT N/A 05/04/2020   Procedure: ABDOMINAL AORTIC ENDOVASCULAR STENT GRAFT;  Surgeon: Larina Earthly, MD;  Location: [facility] OR;  Service: Vascular;  Laterality: N/A;   CATARACT EXTRACTION Bilateral 2009   DIAGNOSTIC LAPAROSCOPY     laparoscopic hernia repair   EYE SURGERY Bilateral    cataract removal   HERNIA REPAIR Bilateral 1999, 2006   JOINT REPLACEMENT Right ~2018   hip replacement   pheochromocytoma  1993   PROSTATECTOMY N/A 05/15/2013   Procedure: PROSTATECTOMY RETROPUBIC; SIMPLE OPEN PROSTATECTOMY;  Surgeon: Valetta Fuller, MD;  Location: [facility];  Service: Urology;  Laterality: N/A;   REPAIR OF ACUTE ASCENDING THORACIC AORTIC DISSECTION N/A 09/17/2022   Procedure: REPAIR OF ACUTE ASCENDING THORACIC AORTIC DISSECTION USING 28 MM HEMASHIELD PLATINUM VASCULAR GRAFT;  Surgeon:  Loreli Slot, MD;  Location: [facility] OR;  Service: Vascular;  Laterality: N/A;  Median sternotomy   TOTAL ELBOW REPLACEMENT Left    > 30 years ago   TOTAL HIP ARTHROPLASTY Right 09/08/2017   Procedure: RIGHT TOTAL HIP ARTHROPLASTY ANTERIOR APPROACH;  Surgeon: Kathryne Hitch, MD;  Location: [facility];  Service: Orthopedics;  Laterality: Right;   ULTRASOUND GUIDANCE FOR VASCULAR ACCESS Bilateral 05/04/2020   Procedure: ULTRASOUND GUIDANCE FOR VASCULAR ACCESS;  Surgeon: Larina Earthly, MD;  Location: [facility] OR;  Service: Vascular;  Laterality: Bilateral;   Patient Active Problem List   Diagnosis Date Noted   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) 10/26/2022   CVA (cerebral vascular accident) (HCC) 10/04/2022   Sepsis with acute renal failure without septic shock (HCC) 09/30/2022   S/P aortic dissection repair 09/18/2022   Status post surgery 09/17/2022   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) 05/22/2022   Colon cancer screening 05/22/2022   Essential hypertension 05/22/2022   Hemorrhoids 05/22/2022   Inguinal hernia 05/22/2022   Irritable bowel syndrome 05/22/2022   Mixed hyperlipidemia 05/22/2022   Pure hypercholesterolemia 05/22/2022   Thrombocytopenia (HCC) 05/22/2022   Allergic rhinitis 05/22/2022   AAA (abdominal aortic aneurysm) without rupture (HCC) 05/04/2020   Trochanteric bursitis, right hip 08/20/2018   History of right hip replacement 08/20/2018  Status post total replacement of right hip 09/08/2017   Pain of right hip joint 08/07/2017   Unilateral primary osteoarthritis, right hip 08/07/2017   BPH (benign prostatic hypertrophy) with urinary obstruction 05/15/2013    ONSET DATE: 09/17/22  REFERRING DIAG: R13.10 (ICD-10-CM) - Dysphagia, unspecified  THERAPY DIAG:  Cognitive communication deficit  Rationale for Evaluation and Treatment: Rehabilitation  SUBJECTIVE:   SUBJECTIVE STATEMENT: "We went for a walk, then went to the gym. We are having a dinner  party."--able to recall details from earlier today.   PAIN:  Are you having pain? No  OBJECTIVE:  Pt is a 82 y/o M admitted to [facility] on 09/17/22 with type 1 aortic dissection requiring emergent repair. Head CT 11/6 acute infarcts within R MCA involvement, including R cerebellum, basal ganglia, frontal lobe. ETT 11/4-11/9. PMH AAA s/p endovascular repair (2020), HTN, HLD, GERD.    Speech Therapy Progress Note  Dates of Reporting Period: 12/26/22 to 05/29/23  Objective Reports of Subjective Statement: Aphasia improved to moderately complex conversation, halting mild, aphasia mild. Cognitive impairments evident as aphasia resolves - including slow processing, reading comprehension, organization.  Objective Measurements: Following written instructions - moderately complex 50% without A, 75% with frequent mod A  Goal Update: Added cognitive goals for reading, organization  Plan: Continue skilled ST per POC,   Reason Skilled Services are Required: As aphasia resolves, cognitive communication impairments are evident. Attention, processing, organization difficulties are noted by spouse in ADL and IADLs.  TODAY'S TREATMENT:                                                                                                                                         DATE:   05/31/23: Targeted following complex directions, reading comprehension, and executive functioning by finding mistakes on a fixed menu. Pt was able to find  mistakes and make accurate changes when provided with occasional min-A of prompting questions, extra processing time, phonemic cues, and immediate feedback. Patient demonstrated limited word finding and required occasional min-A of semantic and visual cues. Patient stated double vision during the task, and advocated for a break, however he did well catching spelling errors during the session with 90% accuracy. Led through mental math to target following directions. Patient required  occasional mod-A of visual clues to guide thinking and gestures to help in organization.   05/29/23: Targeted organization, reading comprehension, and following complex directions through mental math. SLP provided occasional min-A of guiding questions and reminders to check back over directions to increase attention to detail. Patient was able to follow complex directions with 20% accuracy independently, but could increase to 40% when provided with frequent max-A of additional processing time, repetition and simplification of directions, and immediate feedback. Patient struggled with cardinal directions during today's session. Targeted reasoning, and organizing with card sort 3 piles, 3 rules, patient required extended processing time, and frequent reminders, verbal and visual cues.  HEP of rhyming and charts to increase cognitive flexibility.   05/24/23: Reading comprehension, organization, attention to detail targeted following complex written instructions to fill out a chart/log with frequent mod A and extended time. James Estrada benefited from cues to cross off each direction after he completed it. He requires consistent extra processing time. Targeted reasoning, attention and recall with card sort 2 piles, 2 rules - again extended time needed consistently, as well as frequent verbal cues and visual cues.   05/17/23: Targeted reading comprehension, mental math, attention to detail  Reading bank schedule/hours - James Estrada required frequent mod verbal and visual cues (pointing) and written cues to attend to correct details in questions and written cues to organize mental time math. In reading comprehension, organization and attention to detail task organizing pantry to detailed instructions, James Estrada required frequent mod verbal and visual cues. Spouse requesting worksheets - provided simple deduction sheets - practiced 1 with frequent mod A. Spouse aware Ausar will need A to complete at home.  05/15/23:Diamante continues  to practice accessing music, contacts and photos - most success with accessing his music library. He continues to use [employer] and written cues to add appointments to his phone calendar, writing out the information including am/pm. Targeted reading comprehension, attention to detail and mental math reading chart for basketball tickets and attending to details -James Estrada required frequent mod verbal cues and written cues (for math) to answer questions accurately 5/5x. In organization and reading comprehension task, Zaydin required frequent mod verbal and visual cues to follow instructions to organize information. He required consistent extended time for processing  05/03/23: Purvis returns after 2 weeks due to move from his home into [facility] - independent living. Targeted attention, problem solving, sequencing navigating music on iphone. James Estrada required mod A, modeling and written cues to give James Estrada exact command to "open my [employer]" as he told her "play music" and "open [employer]" after several practice commands, Akai successfully opened his [employer]. He required frequent mod A to ID and select country, jazz, oldies play lists and max A to use library to find a Paramedic. Electrical engineer feature on iPhone to put frequently used applications on first page - they are to have their son work with them on this feature. Steaven accessed note section on his phone with frequent mod A - visual cues - his note app is complex with many folders and folders in folders. This level of detail may not be feasible. Kannen is attempting to use James Estrada to add appointments in his calendar, however he has to make many attempts to get this correct. We generated and practiced strategy of writing down the appointment, time and date including am/pm for Dom to read this to Gann - with usual mod verbal and written cues, Adom successfully added his next ST appointment after 2 attempts.  04/12/23: James Estrada reports he made  successful phone call to a plumber re: pipe leak - he described the problem, set appointment time and date. He has also had phone calls with remote friends with success. HW completed (written sentences with 3 given words) James Estrada generated sentences with 2/3 given words - usual min A and extended time to ID and correct errors. Targeted narrative discourse re: cause of leak and summarizing a podcast he listened to re: Trump trial - [place] continues to require usual clarification requests to repair communication break down at narrative level due to aphasia and processing. Sequencing events in narratives cohesively requires usual questioning cues. Extended time and  halting due to word finding.   04/05/23: James Estrada read sentences he wrote for home work - he noted his errors and noted when his sentences however required mod A to correct them. Narrative discourse targeted in 2 stories about when his children were born. James Estrada required verbal cues to ID semantic paraphasias (pediatrician/Obstetrician; peaches/strawberries; sister/aunt) and occasional questioning cues to clarify sequence and salient details. Usual halting for word finding in both narratives. VNeST to target word finding and sentence generation - using the verb "repair" Callahan generated 4 subjects and objects with occasional semantic cues. He generated complex sentence with the verb with frequent semantic, questioning cues to answer and write "wh" questions - confusion noted in this activity writing sentence - mod A to ID and correct errors. Noal named 3 couples they have plan with, 2 tonight and 1 on Saturday - 2/6 names required cues/corrections from spouse  04/03/23: James Estrada and James Estrada report success participating in conversation at lunch with a friend. He brings in his paragraph level homework, with 6 sentences, errors corrected . He successfully made short phone call to his eye doctor (made 1 phone call to person outside of family to date) James Estrada  continues to report communication breakdown due to becoming anxious when he falters or can't find a word. Targeted compensations for aphasia generating 3 similarities and 3 differences of 4 pairs or related words to train in using semantic features to describe as compensation for aphasia. Ossiel required extended time consistently, frequent pausing for word finding and required usual min semantic or questioning cues to generate a total of 24 similarities and differences. In this task and conversation James Estrada requires semantic cues to correct use of related word (semantic paraphasia or inaccurate substitution)  03/29/23: Discussed cognitive changes since CVA, Shin continues to note word finding as main difficulty. Provided example of attention impairment when he was writing and his wife told him a word to write, he did not respond or attend to her due to cognitive load of writing a word. With verbal cues, they Id'd that divided attention is affected. Provided strategies to compensate for attention and processing when information is important. Targeted written expression in complex naming task- James Estrada required occasional mod verbal cues to ID and correct written errors at word level. Demonstrated strategy of pre-planning names and topics before their lunch with a friend. James Estrada generated 3 family members of the friend they are having lunch with that he will ask about - we wrote the names on the white board, James Estrada named 2 more family of this friend and added it to white board. James Estrada generated 2 topics he can talk to their friend about ([employer] scholarship and grandson's summer plans)- which were also added to board as written cues.   5/13/24[name] completed homework of adding on to paragraph with given words to generate 7 sentence paragraph of a cohesive story.  To target written expression, word finding, and complex sentence generation,   Scientist, product/process development (VNeST) was utilized. The pt generated  3 subjects and objects for 2 verbs (prepare, defend), for a total of 6 subject objects. Pt required occasional min cues. Pt generated 2 complex sentences by answering "wh" questions. Pt required occasional min cues to generate complex sentences. James Estrada requires usual min to mod A to ID and self correct errors. He is improving awareness that he has completed VNeST many times but continues to required mod A to comprehend the task, instead attempting to write several independent sentences. James Estrada verbalized awareness that  he has done this task several times but doesn't recall how to do it. Discussion re: improved awareness as well as ongoing cognitive impairments.    PATIENT EDUCATION: Education details: See patient instructions, see today's treatment, compensations for aphasia Person educated: Patient and Spouse Education method: Explanation, Verbal cues, and Handouts Education comprehension: verbal cues required and needs further education  GOALS: Goals reviewed with patient? Yes   SHORT TERM GOALS: Target date: 01/23/23   Pt will name 10 items in personally relevant categories over 3 sessions with occasional min A Baseline: Goal status: MET   2.  Pt will write 4 item grocery list with occasional min A Baseline:  Goal status: MET   3.  Pt will respond to 3 texts with occasional min A over 1 week Baseline:  Goal status: MET   4.  Pt will generate complex verbal sentences 3x using Scientist, product/process development with rare min A over 2 sessions Baseline:  Goal status: MET   5.  Pt will comprehend 4 sentence passage/email with extended time and rare min A Baseline:  Goal status: MET   6.  Pt will employ compensations for aphasia in structured language tasks with occasional min A Baseline:  Goal status: MET   LONG TERM GOALS: Target date: 06/12/23   Pt will complete complex naming tasks with 80% accuracy and occasional min A Baseline:  Goal status: MET   2.  Pt will carryover  verbal compensations for aphasia with occasional min A in conversation as needed over 2 sessions Baseline:  Goal status: MET   3.  Pt will carryover 3 strategies to successfully communicate in community with clerks, servers etc Baseline:  Goal status: MET   4.  Pt will write 3 sentence text or email with occasional min A, external aids and occasional min A  Baseline:  Goal status: MET   5.  Pt will improve score on Communicative Participation Item Bank by 4 points Baseline: 14/30 Goal status: MET  6. Pt will comprehend 3-5 paragraph story or article by accurately summarizing salient details in cohesive summary with rare min A             Goal Status: Ongoing - recert 03/20/23  7. Pt will generate 5-7 sentence paragraph or email, Iding and correcting errors with occasional min A              Goal Status: MET  8. Pt will generate detailed, cohesive summary of a movie, news, article show or story requiring 3 or less requests for clarification               Goal Status: Ongoing  9. Pt will carryover 2 compensations to make 3 phone calls to friends  or businesses with rare min A               Goal Status: MET  10. Pt will navigate music, photos and note section on his phone with occasional min A                Goal Status: On going  11. Pt will follow moderately complex written instructions/directions with occasional min A 8/10                 Goal Status: New  12. Pt will complex moderately complex organization, reasoning and deduction tasks with 80% accuracy and occasional min A  ASSESSMENT:   CLINICAL IMPRESSION:  James Estrada  continues to make excellent progress and reports actively completing his HEP at home. Verbal expression for simple conversation is with mod I and mildly complex conversation with rare min A and occasional halting for word finding. Written expression at sentence level is accurate with rare min A and short paragraph level with occasional  aphasic errors,  occasional mod A to ID and correct errors. However complex written sentences, and 5-6 sentence paragraphs continue to exhibit spelling, syntax and tangential errors.  Reading comprehension intact now at sentence and simple 3-4 sentence paragraph level. Ongoing extended time and occasional min to mod A for moderately complex paragraphs and short articles. James Estrada endorses that reading is slower and labored. He also cites reduced attention and processing of details James Estrada verbalized that he would like to work on using the notes, photos, music in his iphone as he was proficient with this prior to CVA and cognitive impairments have affected his ability to use his phone. Pt continues to benefit from ST intervention to maximize his communication and improve QoL at home and in the community. Added goal for iphone navigation on 04/12/23   OBJECTIVE IMPAIRMENTS: include memory and aphasia. These impairments are limiting patient from managing medications, managing appointments, managing finances, household responsibilities, ADLs/IADLs, and effectively communicating at home and in community. Factors affecting potential to achieve goals and functional outcome are ability to learn/carryover information. Patient will benefit from skilled SLP services to address above impairments and improve overall function.   REHAB POTENTIAL: Good   PLAN:   SLP FREQUENCY: 2x/week   SLP DURATION: 12 weeks   PLANNED INTERVENTIONS: Language facilitation, Environmental controls, Cueing hierachy, Cognitive reorganization, Internal/external aids, Functional tasks, and Multimodal communication approach     Lovvorn, Radene Journey, CCC-SLP 05/31/2023, 2:55 PM

## 2023-06-05 ENCOUNTER — Encounter: Payer: Self-pay | Admitting: Speech Pathology

## 2023-06-05 ENCOUNTER — Ambulatory Visit: Payer: Medicare Other | Admitting: Speech Pathology

## 2023-06-05 DIAGNOSIS — R41841 Cognitive communication deficit: Secondary | ICD-10-CM | POA: Diagnosis not present

## 2023-06-05 DIAGNOSIS — R4701 Aphasia: Secondary | ICD-10-CM | POA: Diagnosis not present

## 2023-06-05 NOTE — Therapy (Signed)
OUTPATIENT SPEECH LANGUAGE PATHOLOGY TREATMENT NOTE   Patient Name: James Estrada MRN: [id number] DOB:[birth year], 82 y.o., male Today's Date: 06/05/2023  PCP: Renford Dills, MD REFERRING PROVIDER: Renford Dills, MD  END OF SESSION:   End of Session - 06/05/23 1407     Visit Number 32    Number of Visits 50    Date for SLP Re-Evaluation 06/12/23    Authorization Type Medicare    SLP Start Time 1400    SLP Stop Time  1445    SLP Time Calculation (min) 45 min    Activity Tolerance Patient tolerated treatment well             Past Medical History:  Diagnosis Date   AAA (abdominal aortic aneurysm) (HCC)    last u/s done 07/18/17    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) 05/22/2022   Arthritis    Bradycardia    CVA (cerebral vascular accident) (HCC) 10/04/2022   Dysrhythmia    frequent PAC, for 20 years   GERD (gastroesophageal reflux disease)    occ, OTC   Hemorrhoids    History of hiatal hernia    Hyperlipidemia    Hypertension    S/P aortic dissection repair 09/18/2022   Seasonal allergies    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) 10/26/2022   Past Surgical History:  Procedure Laterality Date   ABDOMINAL AORTIC ENDOVASCULAR STENT GRAFT N/A 05/04/2020   Procedure: ABDOMINAL AORTIC ENDOVASCULAR STENT GRAFT;  Surgeon: Larina Earthly, MD;  Location: [facility] OR;  Service: Vascular;  Laterality: N/A;   CATARACT EXTRACTION Bilateral 2009   DIAGNOSTIC LAPAROSCOPY     laparoscopic hernia repair   EYE SURGERY Bilateral    cataract removal   HERNIA REPAIR Bilateral 1999, 2006   JOINT REPLACEMENT Right ~2018   hip replacement   pheochromocytoma  1993   PROSTATECTOMY N/A 05/15/2013   Procedure: PROSTATECTOMY RETROPUBIC; SIMPLE OPEN PROSTATECTOMY;  Surgeon: Valetta Fuller, MD;  Location: [facility] ORS;  Service: Urology;  Laterality: N/A;   REPAIR OF ACUTE ASCENDING THORACIC AORTIC DISSECTION N/A 09/17/2022   Procedure: REPAIR OF ACUTE ASCENDING THORACIC AORTIC DISSECTION  USING 28 MM HEMASHIELD PLATINUM VASCULAR GRAFT;  Surgeon: Loreli Slot, MD;  Location: [facility] OR;  Service: Vascular;  Laterality: N/A;  Median sternotomy   TOTAL ELBOW REPLACEMENT Left    > 30 years ago   TOTAL HIP ARTHROPLASTY Right 09/08/2017   Procedure: RIGHT TOTAL HIP ARTHROPLASTY ANTERIOR APPROACH;  Surgeon: Kathryne Hitch, MD;  Location: [facility] ORS;  Service: Orthopedics;  Laterality: Right;   ULTRASOUND GUIDANCE FOR VASCULAR ACCESS Bilateral 05/04/2020   Procedure: ULTRASOUND GUIDANCE FOR VASCULAR ACCESS;  Surgeon: Larina Earthly, MD;  Location: [facility] OR;  Service: Vascular;  Laterality: Bilateral;   Patient Active Problem List   Diagnosis Date Noted   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) 10/26/2022   CVA (cerebral vascular accident) (HCC) 10/04/2022   Sepsis with acute renal failure without septic shock (HCC) 09/30/2022   S/P aortic dissection repair 09/18/2022   Status post surgery 09/17/2022   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) 05/22/2022   Colon cancer screening 05/22/2022   Essential hypertension 05/22/2022   Hemorrhoids 05/22/2022   Inguinal hernia 05/22/2022   Irritable bowel syndrome 05/22/2022   Mixed hyperlipidemia 05/22/2022   Pure hypercholesterolemia 05/22/2022   Thrombocytopenia (HCC) 05/22/2022   Allergic rhinitis 05/22/2022   AAA (abdominal aortic aneurysm) without rupture (HCC) 05/04/2020   Trochanteric bursitis, right hip 08/20/2018  History of right hip replacement 08/20/2018   Status post total replacement of right hip 09/08/2017   Pain of right hip joint 08/07/2017   Unilateral primary osteoarthritis, right hip 08/07/2017   BPH (benign prostatic hypertrophy) with urinary obstruction 05/15/2013    ONSET DATE: 09/17/22  REFERRING DIAG: R13.10 (ICD-10-CM) - Dysphagia, unspecified  THERAPY DIAG:  Cognitive communication deficit  Aphasia  Rationale for Evaluation and Treatment: Rehabilitation  SUBJECTIVE:   SUBJECTIVE  STATEMENT: "We went for a walk, then went to the gym. We are having a dinner party."--able to recall details from earlier today.   PAIN:  Are you having pain? No  OBJECTIVE:  Pt is a 82 y/o M admitted to [facility] on 09/17/22 with type 1 aortic dissection requiring emergent repair. Head CT 11/6 acute infarcts within R MCA involvement, including R cerebellum, basal ganglia, frontal lobe. ETT 11/4-11/9. PMH AAA s/p endovascular repair (2020), HTN, HLD, GERD.    Speech Therapy Progress Note  Dates of Reporting Period: 12/26/22 to 05/29/23  Objective Reports of Subjective Statement: Aphasia improved to moderately complex conversation, halting mild, aphasia mild. Cognitive impairments evident as aphasia resolves - including slow processing, reading comprehension, organization.  Objective Measurements: Following written instructions - moderately complex 50% without A, 75% with frequent mod A  Goal Update: Added cognitive goals for reading, organization  Plan: Continue skilled ST per POC,   Reason Skilled Services are Required: As aphasia resolves, cognitive communication impairments are evident. Attention, processing, organization difficulties are noted by spouse in ADL and IADLs.  TODAY'S TREATMENT:                                                                                                                                         DATE:   06/05/23: Targeted reasoning, processing and following written directions in deduction chart - he required consistent mod to max A to follow simple directions with consistent extended time for processing. James Estrada benefits from covering parts of chart to improve processing speed and attention. In mildly complex card sort James Estrada required extended time for processing consistently and frequent cues to recall rules and follow rules as well as use reasoning and strategy.  05/31/23: Targeted following complex directions, reading comprehension, and executive functioning by  finding mistakes on a fixed menu. Pt was able to find  mistakes and make accurate changes when provided with occasional min-A of prompting questions, extra processing time, phonemic cues, and immediate feedback. Patient demonstrated limited word finding and required occasional min-A of semantic and visual cues. Patient stated double vision during the task, and advocated for a break, however he did well catching spelling errors during the session with 90% accuracy. Led through mental math to target following directions. Patient required occasional mod-A of visual clues to guide thinking and gestures to help in organization.   05/29/23: Targeted organization, reading comprehension, and following complex directions through mental math. SLP provided  occasional min-A of guiding questions and reminders to check back over directions to increase attention to detail. Patient was able to follow complex directions with 20% accuracy independently, but could increase to 40% when provided with frequent max-A of additional processing time, repetition and simplification of directions, and immediate feedback. Patient struggled with cardinal directions during today's session. Targeted reasoning, and organizing with card sort 3 piles, 3 rules, patient required extended processing time, and frequent reminders, verbal and visual cues. HEP of rhyming and charts to increase cognitive flexibility.   05/24/23: Reading comprehension, organization, attention to detail targeted following complex written instructions to fill out a chart/log with frequent mod A and extended time. James Estrada benefited from cues to cross off each direction after he completed it. He requires consistent extra processing time. Targeted reasoning, attention and recall with card sort 2 piles, 2 rules - again extended time needed consistently, as well as frequent verbal cues and visual cues.   05/17/23: Targeted reading comprehension, mental math, attention to detail   Reading bank schedule/hours - James Estrada required frequent mod verbal and visual cues (pointing) and written cues to attend to correct details in questions and written cues to organize mental time math. In reading comprehension, organization and attention to detail task organizing pantry to detailed instructions, James Estrada required frequent mod verbal and visual cues. Spouse requesting worksheets - provided simple deduction sheets - practiced 1 with frequent mod A. Spouse aware James Estrada will need A to complete at home.  05/15/23:James Estrada continues to practice accessing music, contacts and photos - most success with accessing his music library. He continues to use [employer] and written cues to add appointments to his phone calendar, writing out the information including am/pm. Targeted reading comprehension, attention to detail and mental math reading chart for basketball tickets and attending to details -Aylen required frequent mod verbal cues and written cues (for math) to answer questions accurately 5/5x. In organization and reading comprehension task, James Estrada required frequent mod verbal and visual cues to follow instructions to organize information. He required consistent extended time for processing  05/03/23: James Estrada returns after 2 weeks due to move from his home into [facility] - independent living. Targeted attention, problem solving, sequencing navigating music on iphone. James Estrada required mod A, modeling and written cues to give James Estrada exact command to "open my [employer]" as he told her "play music" and "open [employer]" after several practice commands, James Estrada successfully opened his [employer]. He required frequent mod A to ID and select country, jazz, oldies play lists and max A to use library to find a Paramedic. Electrical engineer feature on iPhone to put frequently used applications on first page - they are to have their son work with them on this feature. Broadus accessed note section on his phone with  frequent mod A - visual cues - his note app is complex with many folders and folders in folders. This level of detail may not be feasible. Emrick is attempting to use James Estrada to add appointments in his calendar, however he has to make many attempts to get this correct. We generated and practiced strategy of writing down the appointment, time and date including am/pm for [employer] to read this to James Estrada - with usual mod verbal and written cues, James Estrada successfully added his next ST appointment after 2 attempts.  04/12/23: James Estrada reports he made successful phone call to a plumber re: pipe leak - he described the problem, set appointment time and date. He has also had phone calls with  remote friends with success. HW completed (written sentences with 3 given words) James Estrada generated sentences with 2/3 given words - usual min A and extended time to ID and correct errors. Targeted narrative discourse re: cause of leak and summarizing a podcast he listened to re: Trump trial - James Estrada continues to require usual clarification requests to repair communication break down at narrative level due to aphasia and processing. Sequencing events in narratives cohesively requires usual questioning cues. Extended time and halting due to word finding.   04/05/23: James Estrada read sentences he wrote for home work - he noted his errors and noted when his sentences however required mod A to correct them. Narrative discourse targeted in 2 stories about when his children were born. James Estrada required verbal cues to ID semantic paraphasias (pediatrician/Obstetrician; peaches/strawberries; sister/aunt) and occasional questioning cues to clarify sequence and salient details. Usual halting for word finding in both narratives. VNeST to target word finding and sentence generation - using the verb "repair" Avier generated 4 subjects and objects with occasional semantic cues. He generated complex sentence with the verb with frequent semantic, questioning cues to  answer and write "wh" questions - confusion noted in this activity writing sentence - mod A to ID and correct errors. Antino named 3 couples they have plan with, 2 tonight and 1 on Saturday - 2/6 names required cues/corrections from spouse  04/03/23: James Estrada and James Estrada report success participating in conversation at lunch with a friend. He brings in his paragraph level homework, with 6 sentences, errors corrected . He successfully made short phone call to his eye doctor (made 1 phone call to person outside of family to date) James Estrada continues to report communication breakdown due to becoming anxious when he falters or can't find a word. Targeted compensations for aphasia generating 3 similarities and 3 differences of 4 pairs or related words to train in using semantic features to describe as compensation for aphasia. James Estrada required extended time consistently, frequent pausing for word finding and required usual min semantic or questioning cues to generate a total of 24 similarities and differences. In this task and conversation James Estrada requires semantic cues to correct use of related word (semantic paraphasia or inaccurate substitution)      PATIENT EDUCATION: Education details: See patient instructions, see today's treatment, compensations for aphasia Person educated: Patient and Spouse Education method: Explanation, Verbal cues, and Handouts Education comprehension: verbal cues required and needs further education  GOALS: Goals reviewed with patient? Yes   SHORT TERM GOALS: Target date: 01/23/23   Pt will name 10 items in personally relevant categories over 3 sessions with occasional min A Baseline: Goal status: MET   2.  Pt will write 4 item grocery list with occasional min A Baseline:  Goal status: MET   3.  Pt will respond to 3 texts with occasional min A over 1 week Baseline:  Goal status: MET   4.  Pt will generate complex verbal sentences 3x using Scientist, product/process development  with rare min A over 2 sessions Baseline:  Goal status: MET   5.  Pt will comprehend 4 sentence passage/email with extended time and rare min A Baseline:  Goal status: MET   6.  Pt will employ compensations for aphasia in structured language tasks with occasional min A Baseline:  Goal status: MET   LONG TERM GOALS: Target date: 06/12/23   Pt will complete complex naming tasks with 80% accuracy and occasional min A Baseline:  Goal status: MET   2.  Pt will carryover verbal compensations for aphasia with occasional min A in conversation as needed over 2 sessions Baseline:  Goal status: MET   3.  Pt will carryover 3 strategies to successfully communicate in community with clerks, servers etc Baseline:  Goal status: MET   4.  Pt will write 3 sentence text or email with occasional min A, external aids and occasional min A  Baseline:  Goal status: MET   5.  Pt will improve score on Communicative Participation Item Bank by 4 points Baseline: 14/30 Goal status: MET  6. Pt will comprehend 3-5 paragraph story or article by accurately summarizing salient details in cohesive summary with rare min A             Goal Status: Ongoing - recert 03/20/23  7. Pt will generate 5-7 sentence paragraph or email, Iding and correcting errors with occasional min A              Goal Status: MET  8. Pt will generate detailed, cohesive summary of a movie, news, article show or story requiring 3 or less requests for clarification               Goal Status: Ongoing  9. Pt will carryover 2 compensations to make 3 phone calls to friends  or businesses with rare min A               Goal Status: MET  10. Pt will navigate music, photos and note section on his phone with occasional min A                Goal Status: On going  11. Pt will follow moderately complex written instructions/directions with occasional min A 8/10                 Goal Status: New  12. Pt will complex moderately complex  organization, reasoning and deduction tasks with 80% accuracy and occasional min A                             ASSESSMENT:   CLINICAL IMPRESSION:  James Estrada  continues to make excellent progress and reports actively completing his HEP at home. Verbal expression for simple conversation is with mod I and mildly complex conversation with rare min A and occasional halting for word finding. Written expression at sentence level is accurate with rare min A and short paragraph level with occasional aphasic errors,  occasional mod A to ID and correct errors. However complex written sentences, and 5-6 sentence paragraphs continue to exhibit spelling, syntax and tangential errors.  Reading comprehension intact now at sentence and simple 3-4 sentence paragraph level. Ongoing extended time and occasional min to mod A for moderately complex paragraphs and short articles. James Estrada endorses that reading is slower and labored. He also cites reduced attention and processing of details James Estrada verbalized that he would like to work on using the notes, photos, music in his iphone as he was proficient with this prior to CVA and cognitive impairments have affected his ability to use his phone. Pt continues to benefit from ST intervention to maximize his communication and improve QoL at home and in the community. Spouse is requesting to continue ST - they identified impairments in planning, especially for the gym ,as spouse must consistently cue James Estrada to bring a towel, shampoo, brush etc each time they go. They also identified James Estrada used to [employer] but now is not due  to cognitive impairments. Will target these, however if functional impairments continue will consider re-certing.  OBJECTIVE IMPAIRMENTS: include memory and aphasia. These impairments are limiting patient from managing medications, managing appointments, managing finances, household responsibilities, ADLs/IADLs, and effectively communicating at home and in community. Factors  affecting potential to achieve goals and functional outcome are ability to learn/carryover information. Patient will benefit from skilled SLP services to address above impairments and improve overall function.   REHAB POTENTIAL: Good   PLAN:   SLP FREQUENCY: 2x/week   SLP DURATION: 12 weeks   PLANNED INTERVENTIONS: Language facilitation, Environmental controls, Cueing hierachy, Cognitive reorganization, Internal/external aids, Functional tasks, and Multimodal communication approach     Cavan Bearden, Radene Journey, CCC-SLP 06/05/2023, 3:12 PM

## 2023-06-07 ENCOUNTER — Ambulatory Visit: Payer: Medicare Other | Admitting: Speech Pathology

## 2023-06-07 DIAGNOSIS — R4701 Aphasia: Secondary | ICD-10-CM | POA: Diagnosis not present

## 2023-06-07 DIAGNOSIS — R41841 Cognitive communication deficit: Secondary | ICD-10-CM

## 2023-06-07 NOTE — Therapy (Signed)
[facility] [address] [place], [place], [address] Phone: [phone]   Fax:  [phone]  Patient Details  Name: James Estrada MRN: [id number] Date of Birth: 30-Apr-1941 Referring Provider:  Renford Dills, MD  Encounter Date: 06/07/2023   Dara Hoyer, CCC-SLP 06/07/2023, 2:55 PM  [facility] [facility] [address] [place], [place], [address] Phone: [phone]   Fax:  [phone]

## 2023-06-07 NOTE — Therapy (Signed)
OUTPATIENT SPEECH LANGUAGE PATHOLOGY TREATMENT NOTE   Patient Name: James Estrada MRN: [id number] DOB:04/05/1941, 82 y.o., male Today's Date: 06/07/2023  PCP: Renford Dills, MD REFERRING PROVIDER: Renford Dills, MD  END OF SESSION:   End of Session - 06/07/23 1400     Visit Number 33    Number of Visits 50    Date for SLP Re-Evaluation 06/12/23    Authorization Type Medicare    SLP Start Time 1400    SLP Stop Time  1445    SLP Time Calculation (min) 45 min    Activity Tolerance Patient tolerated treatment well             Past Medical History:  Diagnosis Date   AAA (abdominal aortic aneurysm) (HCC)    last u/s done 07/18/17    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) 05/22/2022   Arthritis    Bradycardia    CVA (cerebral vascular accident) (HCC) 10/04/2022   Dysrhythmia    frequent PAC, for 20 years   GERD (gastroesophageal reflux disease)    occ, OTC   Hemorrhoids    History of hiatal hernia    Hyperlipidemia    Hypertension    S/P aortic dissection repair 09/18/2022   Seasonal allergies    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) 10/26/2022   Past Surgical History:  Procedure Laterality Date   ABDOMINAL AORTIC ENDOVASCULAR STENT GRAFT N/A 05/04/2020   Procedure: ABDOMINAL AORTIC ENDOVASCULAR STENT GRAFT;  Surgeon: Larina Earthly, MD;  Location: [facility] OR;  Service: Vascular;  Laterality: N/A;   CATARACT EXTRACTION Bilateral 2009   DIAGNOSTIC LAPAROSCOPY     laparoscopic hernia repair   EYE SURGERY Bilateral    cataract removal   HERNIA REPAIR Bilateral 1999, 2006   JOINT REPLACEMENT Right ~2018   hip replacement   pheochromocytoma  1993   PROSTATECTOMY N/A 05/15/2013   Procedure: PROSTATECTOMY RETROPUBIC; SIMPLE OPEN PROSTATECTOMY;  Surgeon: Valetta Fuller, MD;  Location: [facility] ORS;  Service: Urology;  Laterality: N/A;   REPAIR OF ACUTE ASCENDING THORACIC AORTIC DISSECTION N/A 09/17/2022   Procedure: REPAIR OF ACUTE ASCENDING THORACIC AORTIC DISSECTION  USING 28 MM HEMASHIELD PLATINUM VASCULAR GRAFT;  Surgeon: Loreli Slot, MD;  Location: [facility] OR;  Service: Vascular;  Laterality: N/A;  Median sternotomy   TOTAL ELBOW REPLACEMENT Left    > 30 years ago   TOTAL HIP ARTHROPLASTY Right 09/08/2017   Procedure: RIGHT TOTAL HIP ARTHROPLASTY ANTERIOR APPROACH;  Surgeon: Kathryne Hitch, MD;  Location: [facility] ORS;  Service: Orthopedics;  Laterality: Right;   ULTRASOUND GUIDANCE FOR VASCULAR ACCESS Bilateral 05/04/2020   Procedure: ULTRASOUND GUIDANCE FOR VASCULAR ACCESS;  Surgeon: Larina Earthly, MD;  Location: [facility] OR;  Service: Vascular;  Laterality: Bilateral;   Patient Active Problem List   Diagnosis Date Noted   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) 10/26/2022   CVA (cerebral vascular accident) (HCC) 10/04/2022   Sepsis with acute renal failure without septic shock (HCC) 09/30/2022   S/P aortic dissection repair 09/18/2022   Status post surgery 09/17/2022   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) 05/22/2022   Colon cancer screening 05/22/2022   Essential hypertension 05/22/2022   Hemorrhoids 05/22/2022   Inguinal hernia 05/22/2022   Irritable bowel syndrome 05/22/2022   Mixed hyperlipidemia 05/22/2022   Pure hypercholesterolemia 05/22/2022   Thrombocytopenia (HCC) 05/22/2022   Allergic rhinitis 05/22/2022   AAA (abdominal aortic aneurysm) without rupture (HCC) 05/04/2020   Trochanteric bursitis, right hip 08/20/2018  History of right hip replacement 08/20/2018   Status post total replacement of right hip 09/08/2017   Pain of right hip joint 08/07/2017   Unilateral primary osteoarthritis, right hip 08/07/2017   BPH (benign prostatic hypertrophy) with urinary obstruction 05/15/2013    ONSET DATE: 09/17/22  REFERRING DIAG: R13.10 (ICD-10-CM) - Dysphagia, unspecified  THERAPY DIAG:  Cognitive communication deficit  Rationale for Evaluation and Treatment: Rehabilitation  SUBJECTIVE:   SUBJECTIVE STATEMENT: "we did  some rhyming and some fill in the blanks. Wanted to see if you could do it." PAIN:  Are you having pain? No  OBJECTIVE:  Pt is a 82 y/o M admitted to [facility] on 09/17/22 with type 1 aortic dissection requiring emergent repair. Head CT 11/6 acute infarcts within R MCA involvement, including R cerebellum, basal ganglia, frontal lobe. ETT 11/4-11/9. PMH AAA s/p endovascular repair (2020), HTN, HLD, GERD.    Speech Therapy Progress Note  Dates of Reporting Period: 12/26/22 to 05/29/23  Objective Reports of Subjective Statement: Aphasia improved to moderately complex conversation, halting mild, aphasia mild. Cognitive impairments evident as aphasia resolves - including slow processing, reading comprehension, organization.  Objective Measurements: Following written instructions - moderately complex 50% without A, 75% with frequent mod A  Goal Update: Added cognitive goals for reading, organization  Plan: Continue skilled ST per POC,   Reason Skilled Services are Required: As aphasia resolves, cognitive communication impairments are evident. Attention, processing, organization difficulties are noted by spouse in ADL and IADLs.  TODAY'S TREATMENT:                                                                                                                                         DATE:   06/07/23: Reading comprehension, organization, attention to detail targeted using deduction puzzles, patient required consistent max-A of pointing gestures, extended wait time, repetition of complex directions. SLP provided semantic cues, and filling in part of the answer in order to facilitate correct answers. Discussion led to find additional functional needs at home and in day to day life. Education provided on remembering names of doctors. Patient states that his goal is to type on the computer and drive a golf cart. Stated apprehension on getting back to golf, and provided education potential PT evaluation in the  future.   06/05/23: Targeted reasoning, processing and following written directions in deduction chart - he required consistent mod to max A to follow simple directions with consistent extended time for processing. Elek benefits from covering parts of chart to improve processing speed and attention. In mildly complex card sort Hazael required extended time for processing consistently and frequent cues to recall rules and follow rules as well as use reasoning and strategy.   05/31/23: Targeted following complex directions, reading comprehension, and executive functioning by finding mistakes on a fixed menu. Pt was able to find  mistakes and make accurate changes when provided with occasional  min-A of prompting questions, extra processing time, phonemic cues, and immediate feedback. Patient demonstrated limited word finding and required occasional min-A of semantic and visual cues. Patient stated double vision during the task, and advocated for a break, however he did well catching spelling errors during the session with 90% accuracy. Led through mental math to target following directions. Patient required occasional mod-A of visual clues to guide thinking and gestures to help in organization.   05/29/23: Targeted organization, reading comprehension, and following complex directions through mental math. SLP provided occasional min-A of guiding questions and reminders to check back over directions to increase attention to detail. Patient was able to follow complex directions with 20% accuracy independently, but could increase to 40% when provided with frequent max-A of additional processing time, repetition and simplification of directions, and immediate feedback. Patient struggled with cardinal directions during today's session. Targeted reasoning, and organizing with card sort 3 piles, 3 rules, patient required extended processing time, and frequent reminders, verbal and visual cues. HEP of rhyming and charts to  increase cognitive flexibility.   05/24/23: Reading comprehension, organization, attention to detail targeted following complex written instructions to fill out a chart/log with frequent mod A and extended time. Emillio benefited from cues to cross off each direction after he completed it. He requires consistent extra processing time. Targeted reasoning, attention and recall with card sort 2 piles, 2 rules - again extended time needed consistently, as well as frequent verbal cues and visual cues.   05/17/23: Targeted reading comprehension, mental math, attention to detail  Reading bank schedule/hours - Kashtyn required frequent mod verbal and visual cues (pointing) and written cues to attend to correct details in questions and written cues to organize mental time math. In reading comprehension, organization and attention to detail task organizing pantry to detailed instructions, Jorge required frequent mod verbal and visual cues. Spouse requesting worksheets - provided simple deduction sheets - practiced 1 with frequent mod A. Spouse aware Dayvin will need A to complete at home.  05/15/23:Wenceslao continues to practice accessing music, contacts and photos - most success with accessing his music library. He continues to use [employer] and written cues to add appointments to his phone calendar, writing out the information including am/pm. Targeted reading comprehension, attention to detail and mental math reading chart for basketball tickets and attending to details -Coltyn required frequent mod verbal cues and written cues (for math) to answer questions accurately 5/5x. In organization and reading comprehension task, Jonnathan required frequent mod verbal and visual cues to follow instructions to organize information. He required consistent extended time for processing  05/03/23: Tip returns after 2 weeks due to move from his home into [facility] - independent living. Targeted attention, problem solving, sequencing navigating  music on iphone. [name] required mod A, modeling and written cues to give Beckey Rutter exact command to "open my [employer]" as he told her "play music" and "open [employer]" after several practice commands, Denvil successfully opened his [employer]. He required frequent mod A to ID and select country, jazz, oldies play lists and max A to use library to find a Paramedic. Electrical engineer feature on iPhone to put frequently used applications on first page - they are to have their son work with them on this feature. Keldric accessed note section on his phone with frequent mod A - visual cues - his note app is complex with many folders and folders in folders. This level of detail may not be feasible. Markon is attempting to  use Siri to add appointments in his calendar, however he has to make many attempts to get this correct. We generated and practiced strategy of writing down the appointment, time and date including am/pm for Jadiel to read this to [place] - with usual mod verbal and written cues, Cristhian successfully added his next ST appointment after 2 attempts.  04/12/23: [name] reports he made successful phone call to a plumber re: pipe leak - he described the problem, set appointment time and date. He has also had phone calls with remote friends with success. HW completed (written sentences with 3 given words) [name] generated sentences with 2/3 given words - usual min A and extended time to ID and correct errors. Targeted narrative discourse re: cause of leak and summarizing a podcast he listened to re: Trump trial - Vence continues to require usual clarification requests to repair communication break down at narrative level due to aphasia and processing. Sequencing events in narratives cohesively requires usual questioning cues. Extended time and halting due to word finding.   04/05/23: [name] read sentences he wrote for home work - he noted his errors and noted when his sentences however required  mod A to correct them. Narrative discourse targeted in 2 stories about when his children were born. [name] required verbal cues to ID semantic paraphasias (pediatrician/Obstetrician; peaches/strawberries; sister/aunt) and occasional questioning cues to clarify sequence and salient details. Usual halting for word finding in both narratives. VNeST to target word finding and sentence generation - using the verb "repair" Alexandros generated 4 subjects and objects with occasional semantic cues. He generated complex sentence with the verb with frequent semantic, questioning cues to answer and write "wh" questions - confusion noted in this activity writing sentence - mod A to ID and correct errors. Daquane named 3 couples they have plan with, 2 tonight and 1 on Saturday - 2/6 names required cues/corrections from spouse  04/03/23: [name] and Kathie Rhodes report success participating in conversation at lunch with a friend. He brings in his paragraph level homework, with 6 sentences, errors corrected . He successfully made short phone call to his eye doctor (made 1 phone call to person outside of family to date) Wanda continues to report communication breakdown due to becoming anxious when he falters or can't find a word. Targeted compensations for aphasia generating 3 similarities and 3 differences of 4 pairs or related words to train in using semantic features to describe as compensation for aphasia. Stepen required extended time consistently, frequent pausing for word finding and required usual min semantic or questioning cues to generate a total of 24 similarities and differences. In this task and conversation Delante requires semantic cues to correct use of related word (semantic paraphasia or inaccurate substitution)   PATIENT EDUCATION: Education details: See patient instructions, see today's treatment, compensations for aphasia Person educated: Patient and Spouse Education method: Explanation, Verbal cues, and  Handouts Education comprehension: verbal cues required and needs further education  GOALS: Goals reviewed with patient? Yes   SHORT TERM GOALS: Target date: 01/23/23   Pt will name 10 items in personally relevant categories over 3 sessions with occasional min A Baseline: Goal status: MET   2.  Pt will write 4 item grocery list with occasional min A Baseline:  Goal status: MET   3.  Pt will respond to 3 texts with occasional min A over 1 week Baseline:  Goal status: MET   4.  Pt will generate complex verbal sentences 3x using Scientist, product/process development with  rare min A over 2 sessions Baseline:  Goal status: MET   5.  Pt will comprehend 4 sentence passage/email with extended time and rare min A Baseline:  Goal status: MET   6.  Pt will employ compensations for aphasia in structured language tasks with occasional min A Baseline:  Goal status: MET   LONG TERM GOALS: Target date: 06/12/23   Pt will complete complex naming tasks with 80% accuracy and occasional min A Baseline:  Goal status: MET   2.  Pt will carryover verbal compensations for aphasia with occasional min A in conversation as needed over 2 sessions Baseline:  Goal status: MET   3.  Pt will carryover 3 strategies to successfully communicate in community with clerks, servers etc Baseline:  Goal status: MET   4.  Pt will write 3 sentence text or email with occasional min A, external aids and occasional min A  Baseline:  Goal status: MET   5.  Pt will improve score on Communicative Participation Item Bank by 4 points Baseline: 14/30 Goal status: MET  6. Pt will comprehend 3-5 paragraph story or article by accurately summarizing salient details in cohesive summary with rare min A             Goal Status: Ongoing - recert 03/20/23  7. Pt will generate 5-7 sentence paragraph or email, Iding and correcting errors with occasional min A              Goal Status: MET  8. Pt will generate detailed,  cohesive summary of a movie, news, article show or story requiring 3 or less requests for clarification               Goal Status: Ongoing  9. Pt will carryover 2 compensations to make 3 phone calls to friends  or businesses with rare min A               Goal Status: MET  10. Pt will navigate music, photos and note section on his phone with occasional min A                Goal Status: On going  11. Pt will follow moderately complex written instructions/directions with occasional min A 8/10                 Goal Status: New  12. Pt will complex moderately complex organization, reasoning and deduction tasks with 80% accuracy and occasional min A                             ASSESSMENT:   CLINICAL IMPRESSION:  Jaxden  continues to make excellent progress and reports actively completing his HEP at home. Verbal expression for simple conversation is with mod I and mildly complex conversation with rare min A and occasional halting for word finding. Written expression at sentence level is accurate with rare min A and short paragraph level with occasional aphasic errors,  occasional mod A to ID and correct errors. However complex written sentences, and 5-6 sentence paragraphs continue to exhibit spelling, syntax and tangential errors.  Reading comprehension intact now at sentence and simple 3-4 sentence paragraph level. Ongoing extended time and occasional min to mod A for moderately complex paragraphs and short articles. Hemi endorses that reading is slower and labored. He also cites reduced attention and processing of details Haruo verbalized that he would like to work on using the notes, photos, music  in his iphone as he was proficient with this prior to CVA and cognitive impairments have affected his ability to use his phone. Pt continues to benefit from ST intervention to maximize his communication and improve QoL at home and in the community. Spouse is requesting to continue ST - they identified  impairments in planning, especially for the gym ,as spouse must consistently cue Limuel to bring a towel, shampoo, brush etc each time they go. They also identified Perfecto used to [employer] but now is not due to cognitive impairments. Will target these, however if functional impairments continue will consider re-certing.  OBJECTIVE IMPAIRMENTS: include memory and aphasia. These impairments are limiting patient from managing medications, managing appointments, managing finances, household responsibilities, ADLs/IADLs, and effectively communicating at home and in community. Factors affecting potential to achieve goals and functional outcome are ability to learn/carryover information. Patient will benefit from skilled SLP services to address above impairments and improve overall function.   REHAB POTENTIAL: Good   PLAN:   SLP FREQUENCY: 2x/week   SLP DURATION: 12 weeks   PLANNED INTERVENTIONS: Language facilitation, Environmental controls, Cueing hierachy, Cognitive reorganization, Internal/external aids, Functional tasks, and Multimodal communication approach     [place], Student-SLP 06/07/2023, 2:00 PM

## 2023-06-12 ENCOUNTER — Encounter: Payer: Self-pay | Admitting: Speech Pathology

## 2023-06-12 ENCOUNTER — Ambulatory Visit: Payer: Medicare Other | Admitting: Speech Pathology

## 2023-06-12 DIAGNOSIS — R4701 Aphasia: Secondary | ICD-10-CM | POA: Diagnosis not present

## 2023-06-12 DIAGNOSIS — R41841 Cognitive communication deficit: Secondary | ICD-10-CM

## 2023-06-12 NOTE — Therapy (Signed)
OUTPATIENT SPEECH LANGUAGE PATHOLOGY TREATMENT NOTE   Patient Name: James Estrada MRN: [id number] DOB:1941/10/25, 82 y.o., male Today's Date: 06/12/2023  PCP: Renford Dills, MD REFERRING PROVIDER: Renford Dills, MD  END OF SESSION:   End of Session - 06/12/23 1414     Visit Number 34    Number of Visits 77    Date for SLP Re-Evaluation 08/07/23    Authorization Type Medicare    SLP Start Time 1400    SLP Stop Time  1445    SLP Time Calculation (min) 45 min    Activity Tolerance Patient tolerated treatment well             Past Medical History:  Diagnosis Date   AAA (abdominal aortic aneurysm) (HCC)    last u/s done 07/18/17    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) 05/22/2022   Arthritis    Bradycardia    CVA (cerebral vascular accident) (HCC) 10/04/2022   Dysrhythmia    frequent PAC, for 20 years   GERD (gastroesophageal reflux disease)    occ, OTC   Hemorrhoids    History of hiatal hernia    Hyperlipidemia    Hypertension    S/P aortic dissection repair 09/18/2022   Seasonal allergies    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) 10/26/2022   Past Surgical History:  Procedure Laterality Date   ABDOMINAL AORTIC ENDOVASCULAR STENT GRAFT N/A 05/04/2020   Procedure: ABDOMINAL AORTIC ENDOVASCULAR STENT GRAFT;  Surgeon: Larina Earthly, MD;  Location: [facility] OR;  Service: Vascular;  Laterality: N/A;   CATARACT EXTRACTION Bilateral 2009   DIAGNOSTIC LAPAROSCOPY     laparoscopic hernia repair   EYE SURGERY Bilateral    cataract removal   HERNIA REPAIR Bilateral 1999, 2006   JOINT REPLACEMENT Right ~2018   hip replacement   pheochromocytoma  1993   PROSTATECTOMY N/A 05/15/2013   Procedure: PROSTATECTOMY RETROPUBIC; SIMPLE OPEN PROSTATECTOMY;  Surgeon: Valetta Fuller, MD;  Location: [facility] ORS;  Service: Urology;  Laterality: N/A;   REPAIR OF ACUTE ASCENDING THORACIC AORTIC DISSECTION N/A 09/17/2022   Procedure: REPAIR OF ACUTE ASCENDING THORACIC AORTIC DISSECTION  USING 28 MM HEMASHIELD PLATINUM VASCULAR GRAFT;  Surgeon: Loreli Slot, MD;  Location: [facility] OR;  Service: Vascular;  Laterality: N/A;  Median sternotomy   TOTAL ELBOW REPLACEMENT Left    > 30 years ago   TOTAL HIP ARTHROPLASTY Right 09/08/2017   Procedure: RIGHT TOTAL HIP ARTHROPLASTY ANTERIOR APPROACH;  Surgeon: Kathryne Hitch, MD;  Location: [facility] ORS;  Service: Orthopedics;  Laterality: Right;   ULTRASOUND GUIDANCE FOR VASCULAR ACCESS Bilateral 05/04/2020   Procedure: ULTRASOUND GUIDANCE FOR VASCULAR ACCESS;  Surgeon: Larina Earthly, MD;  Location: [facility] OR;  Service: Vascular;  Laterality: Bilateral;   Patient Active Problem List   Diagnosis Date Noted   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) 10/26/2022   CVA (cerebral vascular accident) (HCC) 10/04/2022   Sepsis with acute renal failure without septic shock (HCC) 09/30/2022   S/P aortic dissection repair 09/18/2022   Status post surgery 09/17/2022   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) 05/22/2022   Colon cancer screening 05/22/2022   Essential hypertension 05/22/2022   Hemorrhoids 05/22/2022   Inguinal hernia 05/22/2022   Irritable bowel syndrome 05/22/2022   Mixed hyperlipidemia 05/22/2022   Pure hypercholesterolemia 05/22/2022   Thrombocytopenia (HCC) 05/22/2022   Allergic rhinitis 05/22/2022   AAA (abdominal aortic aneurysm) without rupture (HCC) 05/04/2020   Trochanteric bursitis, right hip 08/20/2018  History of right hip replacement 08/20/2018   Status post total replacement of right hip 09/08/2017   Pain of right hip joint 08/07/2017   Unilateral primary osteoarthritis, right hip 08/07/2017   BPH (benign prostatic hypertrophy) with urinary obstruction 05/15/2013    ONSET DATE: 09/17/22  REFERRING DIAG: R13.10 (ICD-10-CM) - Dysphagia, unspecified  THERAPY DIAG:  Cognitive communication deficit  Aphasia  Rationale for Evaluation and Treatment: Rehabilitation  SUBJECTIVE:   SUBJECTIVE  STATEMENT: "we did some rhyming and some fill in the blanks. Wanted to see if you could do it." PAIN:  Are you having pain? No  OBJECTIVE:  Pt is a 82 y/o M admitted [facility] on 09/17/22 with type 1 aortic dissection requiring emergent repair. Head CT 11/6 acute infarcts within R MCA involvement, including R cerebellum, basal ganglia, frontal lobe. ETT 11/4-11/9. PMH AAA s/p endovascular repair (2020), HTN, HLD, GERD.    Speech Therapy Progress Note  Dates of Reporting Period: 12/26/22 to 05/29/23  Objective Reports of Subjective Statement: Aphasia improved to moderately complex conversation, halting mild, aphasia mild. Cognitive impairments evident as aphasia resolves - including slow processing, reading comprehension, organization.  Objective Measurements: Following written instructions - moderately complex 50% without A, 75% with frequent mod A  Goal Update: Added cognitive goals for reading, organization  Plan: Continue skilled ST per POC,   Reason Skilled Services are Required: As aphasia resolves, cognitive communication impairments are evident. Attention, processing, organization difficulties are noted by spouse in ADL and IADLs.  TODAY'S TREATMENT:                                                                                                                                         DATE:   06/12/23: James Estrada completed deduction HW his spouse requested. Targeted attention to detail, mental math and organization completing simple multiplication chart with 10's. 100's and 1000's places. James Estrada frequently required mod verbal, visual cues to complete mental math with correct zeros. Extended time required consistently. Targeted reasoning and organization in mildly complex math reasoning task which required usual min to mod visual/verbal cues. Auditory comprehension required extended time and occasional repetition. Generate strategies to compensate for attention, processing to successfully cook  simple meals.   06/07/23: Reading comprehension, organization, attention to detail targeted using deduction puzzles, patient required consistent max-A of pointing gestures, extended wait time, repetition of complex directions. SLP provided semantic cues, and filling in part of the answer in order to facilitate correct answers. Discussion led to find additional functional needs at home and in day to day life. Education provided on remembering names of doctors. Patient states that his goal is to type on the computer and drive a golf cart. Stated apprehension on getting back to golf, and provided education potential PT evaluation in the future.   06/05/23: Targeted reasoning, processing and following written directions in deduction chart - he required consistent mod to max  A to follow simple directions with consistent extended time for processing. James Estrada benefits from covering parts of chart to improve processing speed and attention. In mildly complex card sort James Estrada required extended time for processing consistently and frequent cues to recall rules and follow rules as well as use reasoning and strategy.   05/31/23: Targeted following complex directions, reading comprehension, and executive functioning by finding mistakes on a fixed menu. Pt was able to find  mistakes and make accurate changes when provided with occasional min-A of prompting questions, extra processing time, phonemic cues, and immediate feedback. Patient demonstrated limited word finding and required occasional min-A of semantic and visual cues. Patient stated double vision during the task, and advocated for a break, however he did well catching spelling errors during the session with 90% accuracy. Led through mental math to target following directions. Patient required occasional mod-A of visual clues to guide thinking and gestures to help in organization.   05/29/23: Targeted organization, reading comprehension, and following complex directions  through mental math. SLP provided occasional min-A of guiding questions and reminders to check back over directions to increase attention to detail. Patient was able to follow complex directions with 20% accuracy independently, but could increase to 40% when provided with frequent max-A of additional processing time, repetition and simplification of directions, and immediate feedback. Patient struggled with cardinal directions during today's session. Targeted reasoning, and organizing with card sort 3 piles, 3 rules, patient required extended processing time, and frequent reminders, verbal and visual cues. HEP of rhyming and charts to increase cognitive flexibility.   05/24/23: Reading comprehension, organization, attention to detail targeted following complex written instructions to fill out a chart/log with frequent mod A and extended time. James Estrada benefited from cues to cross off each direction after he completed it. He requires consistent extra processing time. Targeted reasoning, attention and recall with card sort 2 piles, 2 rules - again extended time needed consistently, as well as frequent verbal cues and visual cues.       PATIENT EDUCATION: Education details: See patient instructions, see today's treatment, compensations for aphasia Person educated: Patient and Spouse Education method: Explanation, Verbal cues, and Handouts Education comprehension: verbal cues required and needs further education  GOALS: Goals reviewed with patient? Yes   SHORT TERM GOALS: Target date: 01/23/23   Pt will name 10 items in personally relevant categories over 3 sessions with occasional min A Baseline: Goal status: MET   2.  Pt will write 4 item grocery list with occasional min A Baseline:  Goal status: MET   3.  Pt will respond to 3 texts with occasional min A over 1 week Baseline:  Goal status: MET   4.  Pt will generate complex verbal sentences 3x using Scientist, product/process development with  rare min A over 2 sessions Baseline:  Goal status: MET   5.  Pt will comprehend 4 sentence passage/email with extended time and rare min A Baseline:  Goal status: MET   6.  Pt will employ compensations for aphasia in structured language tasks with occasional min A Baseline:  Goal status: MET   LONG TERM GOALS: Target date: 08/07/23   Pt will complete complex naming tasks with 80% accuracy and occasional min A Baseline:  Goal status: MET   2.  Pt will carryover verbal compensations for aphasia with occasional min A in conversation as needed over 2 sessions Baseline:  Goal status: MET   3.  Pt will carryover 3 strategies to successfully communicate  in community with clerks, servers etc Baseline:  Goal status: MET   4.  Pt will write 3 sentence text or email with occasional min A, external aids and occasional min A  Baseline:  Goal status: MET   5.  Pt will improve score on Communicative Participation Item Bank by 4 points Baseline: 14/30 Goal status: MET  6. Pt will comprehend 3-5 paragraph story or article by accurately summarizing salient details in cohesive summary with rare min A             Goal Status: PARTIALLY MET  7. Pt will generate 5-7 sentence paragraph or email, Iding and correcting errors with occasional min A              Goal Status: MET  8. Pt will generate detailed, cohesive summary of a movie, news, article show or story requiring 3 or less requests for clarification               Goal Status: Ongoing  9. Pt will carryover 2 compensations to make 3 phone calls to friends  or businesses with rare min A               Goal Status: MET  10. Pt will navigate music, photos and note section on his phone with occasional min A                Goal Status: PARTIALLY MET, note section deferred due to complex organization system  11. Pt will follow moderately complex written instructions/directions with occasional min A 8/10                 Goal Status:  ONGOING  12. Pt will complex moderately complex organization, reasoning and deduction tasks with 80% accuracy and occasional min A                    Goal Status: ONGOING  Clarice will cook moderately complex meal (main dish and side) using compensations for attention, processing and organization with rare min A                    Goal Status: NEW                             ASSESSMENT:   CLINICAL IMPRESSION:  James Estrada  continues to make excellent progress and reports actively completing his HEP at home. Verbal expression for simple conversation is with mod I and mildly complex conversation with rare min A and occasional halting for word finding. Written expression at sentence level is accurate with rare min A and short paragraph level with occasional aphasic errors,  occasional mod A to ID and correct errors. However complex written sentences, and 5-6 sentence paragraphs continue to exhibit spelling, syntax and tangential errors.  Mild aphasia persists, however mild to moderate cognitive impairments continue to impact IADL's. Pt continues to benefit from ST intervention to maximize his communication and improve QoL at home and in the community. Spouse is requesting to continue ST - They  identified [place] used to [employer] but now is not due to cognitive impairments, as well as  reduced planning for outings and activities which requires spouse to prompt and follow up with James Estrada to ensure he has necessary supplies. Recommend James Estrada be re-certified to continue skilled ST to maximize cognitive communication to successfully complete IADL's for independence, QOL and to reduce caregiver burden.  OBJECTIVE IMPAIRMENTS: include memory  and aphasia. These impairments are limiting patient from managing medications, managing appointments, managing finances, household responsibilities, ADLs/IADLs, and effectively communicating at home and in community. Factors affecting potential to achieve goals and functional outcome  are ability to learn/carryover information. Patient will benefit from skilled SLP services to address above impairments and improve overall function.   REHAB POTENTIAL: Good   PLAN:   SLP FREQUENCY: 2x/week   SLP DURATION: 12 weeks   PLANNED INTERVENTIONS: Language facilitation, Environmental controls, Cueing hierachy, Cognitive reorganization, Internal/external aids, Functional tasks, and Multimodal communication approach     Kayson Tasker, Radene Journey, CCC-SLP 06/12/2023, 3:37 PM

## 2023-06-14 ENCOUNTER — Encounter: Payer: Medicare Other | Admitting: Speech Pathology

## 2023-06-19 ENCOUNTER — Encounter: Payer: Self-pay | Admitting: Speech Pathology

## 2023-06-19 ENCOUNTER — Ambulatory Visit: Payer: Medicare Other | Attending: Internal Medicine | Admitting: Speech Pathology

## 2023-06-19 DIAGNOSIS — R4701 Aphasia: Secondary | ICD-10-CM | POA: Insufficient documentation

## 2023-06-19 DIAGNOSIS — R41841 Cognitive communication deficit: Secondary | ICD-10-CM | POA: Insufficient documentation

## 2023-06-19 NOTE — Therapy (Signed)
OUTPATIENT SPEECH LANGUAGE PATHOLOGY TREATMENT NOTE   Patient Name: James Estrada MRN: [id number] DOB:04-Nov-1941, 82 y.o., male Today's Date: 06/19/2023  PCP: Renford Dills, MD REFERRING PROVIDER: Renford Dills, MD  END OF SESSION:   End of Session - 06/19/23 1506     Visit Number 35    Number of Visits 77    Date for SLP Re-Evaluation 08/07/23    Authorization Type Medicare    SLP Start Time 1400    SLP Stop Time  1445    SLP Time Calculation (min) 45 min    Activity Tolerance Patient tolerated treatment well             Past Medical History:  Diagnosis Date   AAA (abdominal aortic aneurysm) (HCC)    last u/s done 07/18/17    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) 05/22/2022   Arthritis    Bradycardia    CVA (cerebral vascular accident) (HCC) 10/04/2022   Dysrhythmia    frequent PAC, for 20 years   GERD (gastroesophageal reflux disease)    occ, OTC   Hemorrhoids    History of hiatal hernia    Hyperlipidemia    Hypertension    S/P aortic dissection repair 09/18/2022   Seasonal allergies    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) 10/26/2022   Past Surgical History:  Procedure Laterality Date   ABDOMINAL AORTIC ENDOVASCULAR STENT GRAFT N/A 05/04/2020   Procedure: ABDOMINAL AORTIC ENDOVASCULAR STENT GRAFT;  Surgeon: Larina Earthly, MD;  Location: [facility] OR;  Service: Vascular;  Laterality: N/A;   CATARACT EXTRACTION Bilateral 2009   DIAGNOSTIC LAPAROSCOPY     laparoscopic hernia repair   EYE SURGERY Bilateral    cataract removal   HERNIA REPAIR Bilateral 1999, 2006   JOINT REPLACEMENT Right ~2018   hip replacement   pheochromocytoma  1993   PROSTATECTOMY N/A 05/15/2013   Procedure: PROSTATECTOMY RETROPUBIC; SIMPLE OPEN PROSTATECTOMY;  Surgeon: Valetta Fuller, MD;  Location: [facility] ORS;  Service: Urology;  Laterality: N/A;   REPAIR OF ACUTE ASCENDING THORACIC AORTIC DISSECTION N/A 09/17/2022   Procedure: REPAIR OF ACUTE ASCENDING THORACIC AORTIC DISSECTION  USING 28 MM HEMASHIELD PLATINUM VASCULAR GRAFT;  Surgeon: Loreli Slot, MD;  Location: [facility] OR;  Service: Vascular;  Laterality: N/A;  Median sternotomy   TOTAL ELBOW REPLACEMENT Left    > 30 years ago   TOTAL HIP ARTHROPLASTY Right 09/08/2017   Procedure: RIGHT TOTAL HIP ARTHROPLASTY ANTERIOR APPROACH;  Surgeon: Kathryne Hitch, MD;  Location: [facility] ORS;  Service: Orthopedics;  Laterality: Right;   ULTRASOUND GUIDANCE FOR VASCULAR ACCESS Bilateral 05/04/2020   Procedure: ULTRASOUND GUIDANCE FOR VASCULAR ACCESS;  Surgeon: Larina Earthly, MD;  Location: [facility] OR;  Service: Vascular;  Laterality: Bilateral;   Patient Active Problem List   Diagnosis Date Noted   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) 10/26/2022   CVA (cerebral vascular accident) (HCC) 10/04/2022   Sepsis with acute renal failure without septic shock (HCC) 09/30/2022   S/P aortic dissection repair 09/18/2022   Status post surgery 09/17/2022   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) 05/22/2022   Colon cancer screening 05/22/2022   Essential hypertension 05/22/2022   Hemorrhoids 05/22/2022   Inguinal hernia 05/22/2022   Irritable bowel syndrome 05/22/2022   Mixed hyperlipidemia 05/22/2022   Pure hypercholesterolemia 05/22/2022   Thrombocytopenia (HCC) 05/22/2022   Allergic rhinitis 05/22/2022   AAA (abdominal aortic aneurysm) without rupture (HCC) 05/04/2020   Trochanteric bursitis, right hip 08/20/2018  History of right hip replacement 08/20/2018   Status post total replacement of right hip 09/08/2017   Pain of right hip joint 08/07/2017   Unilateral primary osteoarthritis, right hip 08/07/2017   BPH (benign prostatic hypertrophy) with urinary obstruction 05/15/2013    ONSET DATE: 09/17/22  REFERRING DIAG: R13.10 (ICD-10-CM) - Dysphagia, unspecified  THERAPY DIAG:  Cognitive communication deficit  Aphasia  Rationale for Evaluation and Treatment: Rehabilitation  SUBJECTIVE:   SUBJECTIVE  STATEMENT: "we did some rhyming and some fill in the blanks. Wanted to see if you could do it." PAIN:  Are you having pain? No  OBJECTIVE:  Pt is a 82 y/o M admitted to [facility] on 09/17/22 with type 1 aortic dissection requiring emergent repair. Head CT 11/6 acute infarcts within R MCA involvement, including R cerebellum, basal ganglia, frontal lobe. ETT 11/4-11/9. PMH AAA s/p endovascular repair (2020), HTN, HLD, GERD.    Speech Therapy Progress Note  Dates of Reporting Period: 12/26/22 to 05/29/23  Objective Reports of Subjective Statement: Aphasia improved to moderately complex conversation, halting mild, aphasia mild. Cognitive impairments evident as aphasia resolves - including slow processing, reading comprehension, organization.  Objective Measurements: Following written instructions - moderately complex 50% without A, 75% with frequent mod A  Goal Update: Added cognitive goals for reading, organization  Plan: Continue skilled ST per POC,   Reason Skilled Services are Required: As aphasia resolves, cognitive communication impairments are evident. Attention, processing, organization difficulties are noted by spouse in ADL and IADLs.  TODAY'S TREATMENT:                                                                                                                                         DATE:   06/19/23: James Estrada continues to access music on his phone with rare min A and is accessing music at home with success. This week will target camera and photos on his smart phone with occasional min A. Targeted reading comprehension, organization and reasoning with frequent mod to max A and extended time on mildly complex deduction task. Targeted reasoning, problem solving, working memory and attention to details reading moderately complex golf course fees/schedule and money math - James Estrada required frequent visual cues of fees required and use of bookmark to read the chart correctly. Extended time and usual  min to mod A for mental money math  06/12/23: James Estrada completed deduction HW his spouse requested. Targeted attention to detail, mental math and organization completing simple multiplication chart with 10's. 100's and 1000's places. Pilot frequently required mod verbal, visual cues to complete mental math with correct zeros. Extended time required consistently. Targeted reasoning and organization in mildly complex math reasoning task which required usual min to mod visual/verbal cues. Auditory comprehension required extended time and occasional repetition. Generate strategies to compensate for attention, processing to successfully cook simple meals.   06/07/23: Reading comprehension, organization, attention to detail targeted using deduction  puzzles, patient required consistent max-A of pointing gestures, extended wait time, repetition of complex directions. SLP provided semantic cues, and filling in part of the answer in order to facilitate correct answers. Discussion led to find additional functional needs at home and in day to day life. Education provided on remembering names of doctors. Patient states that his goal is to type on the computer and drive a golf cart. Stated apprehension on getting back to golf, and provided education potential PT evaluation in the future.   06/05/23: Targeted reasoning, processing and following written directions in deduction chart - he required consistent mod to max A to follow simple directions with consistent extended time for processing. James Estrada benefits from covering parts of chart to improve processing speed and attention. In mildly complex card sort James Estrada required extended time for processing consistently and frequent cues to recall rules and follow rules as well as use reasoning and strategy.   05/31/23: Targeted following complex directions, reading comprehension, and executive functioning by finding mistakes on a fixed menu. Pt was able to find  mistakes and make  accurate changes when provided with occasional min-A of prompting questions, extra processing time, phonemic cues, and immediate feedback. Patient demonstrated limited word finding and required occasional min-A of semantic and visual cues. Patient stated double vision during the task, and advocated for a break, however he did well catching spelling errors during the session with 90% accuracy. Led through mental math to target following directions. Patient required occasional mod-A of visual clues to guide thinking and gestures to help in organization.   05/29/23: Targeted organization, reading comprehension, and following complex directions through mental math. SLP provided occasional min-A of guiding questions and reminders to check back over directions to increase attention to detail. Patient was able to follow complex directions with 20% accuracy independently, but could increase to 40% when provided with frequent max-A of additional processing time, repetition and simplification of directions, and immediate feedback. Patient struggled with cardinal directions during today's session. Targeted reasoning, and organizing with card sort 3 piles, 3 rules, patient required extended processing time, and frequent reminders, verbal and visual cues. HEP of rhyming and charts to increase cognitive flexibility.   05/24/23: Reading comprehension, organization, attention to detail targeted following complex written instructions to fill out a chart/log with frequent mod A and extended time. James Estrada benefited from cues to cross off each direction after he completed it. He requires consistent extra processing time. Targeted reasoning, attention and recall with card sort 2 piles, 2 rules - again extended time needed consistently, as well as frequent verbal cues and visual cues.       PATIENT EDUCATION: Education details: See patient instructions, see today's treatment, compensations for aphasia Person educated: Patient  and Spouse Education method: Explanation, Verbal cues, and Handouts Education comprehension: verbal cues required and needs further education  GOALS: Goals reviewed with patient? Yes   SHORT TERM GOALS: Target date: 01/23/23   Pt will name 10 items in personally relevant categories over 3 sessions with occasional min A Baseline: Goal status: MET   2.  Pt will write 4 item grocery list with occasional min A Baseline:  Goal status: MET   3.  Pt will respond to 3 texts with occasional min A over 1 week Baseline:  Goal status: MET   4.  Pt will generate complex verbal sentences 3x using Scientist, product/process development with rare min A over 2 sessions Baseline:  Goal status: MET   5.  Pt will  comprehend 4 sentence passage/email with extended time and rare min A Baseline:  Goal status: MET   6.  Pt will employ compensations for aphasia in structured language tasks with occasional min A Baseline:  Goal status: MET   LONG TERM GOALS: Target date: 08/07/23   Pt will complete complex naming tasks with 80% accuracy and occasional min A Baseline:  Goal status: MET   2.  Pt will carryover verbal compensations for aphasia with occasional min A in conversation as needed over 2 sessions Baseline:  Goal status: MET   3.  Pt will carryover 3 strategies to successfully communicate in community with clerks, servers etc Baseline:  Goal status: MET   4.  Pt will write 3 sentence text or email with occasional min A, external aids and occasional min A  Baseline:  Goal status: MET   5.  Pt will improve score on Communicative Participation Item Bank by 4 points Baseline: 14/30 Goal status: MET  6. Pt will comprehend 3-5 paragraph story or article by accurately summarizing salient details in cohesive summary with rare min A             Goal Status: PARTIALLY MET  7. Pt will generate 5-7 sentence paragraph or email, Iding and correcting errors with occasional min A              Goal  Status: MET  8. Pt will generate detailed, cohesive summary of a movie, news, article show or story requiring 3 or less requests for clarification               Goal Status: Ongoing  9. Pt will carryover 2 compensations to make 3 phone calls to friends  or businesses with rare min A               Goal Status: MET  10. Pt will navigate music, photos and note section on his phone with occasional min A                Goal Status: PARTIALLY MET, note section deferred due to complex organization system  11. Pt will follow moderately complex written instructions/directions with occasional min A 8/10                 Goal Status: ONGOING  12. Pt will complex moderately complex organization, reasoning and deduction tasks with 80% accuracy and occasional min A                    Goal Status: ONGOING  James Estrada will cook moderately complex meal (main dish and side) using compensations for attention, processing and organization with rare min A                    Goal Status: NEW                             ASSESSMENT:   CLINICAL IMPRESSION:  Jerett  continues to make excellent progress and reports actively completing his HEP at home. Verbal expression for simple conversation is with mod I and mildly complex conversation with rare min A and occasional halting for word finding. Written expression at sentence level is accurate with rare min A and short paragraph level with occasional aphasic errors,  occasional mod A to ID and correct errors. However complex written sentences, and 5-6 sentence paragraphs continue to exhibit spelling, syntax and tangential errors.  Mild aphasia persists, however  mild to moderate cognitive impairments continue to impact IADL's. Pt continues to benefit from ST intervention to maximize his communication and improve QoL at home and in the community. Spouse is requesting to continue ST - They  identified [place] used to [employer] but now is not due to cognitive impairments, as well as   reduced planning for outings and activities which requires spouse to prompt and follow up with James Estrada to ensure he has necessary supplies. Recommend Gianluca be re-certified to continue skilled ST to maximize cognitive communication to successfully complete IADL's for independence, QOL and to reduce caregiver burden.  OBJECTIVE IMPAIRMENTS: include memory and aphasia. These impairments are limiting patient from managing medications, managing appointments, managing finances, household responsibilities, ADLs/IADLs, and effectively communicating at home and in community. Factors affecting potential to achieve goals and functional outcome are ability to learn/carryover information. Patient will benefit from skilled SLP services to address above impairments and improve overall function.   REHAB POTENTIAL: Good   PLAN:   SLP FREQUENCY: 2x/week   SLP DURATION: 12 weeks   PLANNED INTERVENTIONS: Language facilitation, Environmental controls, Cueing hierachy, Cognitive reorganization, Internal/external aids, Functional tasks, and Multimodal communication approach     , Radene Journey, CCC-SLP 06/19/2023, 3:12 PM

## 2023-06-21 ENCOUNTER — Encounter: Payer: Self-pay | Admitting: Speech Pathology

## 2023-06-21 ENCOUNTER — Ambulatory Visit: Payer: Medicare Other | Admitting: Speech Pathology

## 2023-06-21 DIAGNOSIS — R4701 Aphasia: Secondary | ICD-10-CM | POA: Diagnosis not present

## 2023-06-21 DIAGNOSIS — R41841 Cognitive communication deficit: Secondary | ICD-10-CM | POA: Diagnosis not present

## 2023-06-21 NOTE — Therapy (Signed)
OUTPATIENT SPEECH LANGUAGE PATHOLOGY TREATMENT NOTE   Patient Name: James Estrada MRN: [id number] DOB:Mar 22, 1941, 82 y.o., male Today's Date: 06/21/2023  PCP: James Dills, MD REFERRING PROVIDER: Renford Dills, MD  END OF SESSION:   End of Session - 06/21/23 1224     Visit Number 36    Number of Visits 77    Date for SLP Re-Evaluation 08/07/23    Authorization Type Medicare    SLP Start Time 1230    SLP Stop Time  1315    SLP Time Calculation (min) 45 min    Activity Tolerance Patient tolerated treatment well             Past Medical History:  Diagnosis Date   AAA (abdominal aortic aneurysm) (HCC)    last u/s done 07/18/17    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) 05/22/2022   Arthritis    Bradycardia    CVA (cerebral vascular accident) (HCC) 10/04/2022   Dysrhythmia    frequent PAC, for 20 years   GERD (gastroesophageal reflux disease)    occ, OTC   Hemorrhoids    History of hiatal hernia    Hyperlipidemia    Hypertension    S/P aortic dissection repair 09/18/2022   Seasonal allergies    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) 10/26/2022   Past Surgical History:  Procedure Laterality Date   ABDOMINAL AORTIC ENDOVASCULAR STENT GRAFT N/A 05/04/2020   Procedure: ABDOMINAL AORTIC ENDOVASCULAR STENT GRAFT;  Surgeon: James Earthly, MD;  Location: [facility] OR;  Service: Vascular;  Laterality: N/A;   CATARACT EXTRACTION Bilateral 2009   DIAGNOSTIC LAPAROSCOPY     laparoscopic hernia repair   EYE SURGERY Bilateral    cataract removal   HERNIA REPAIR Bilateral 1999, 2006   JOINT REPLACEMENT Right ~2018   hip replacement   pheochromocytoma  1993   PROSTATECTOMY N/A 05/15/2013   Procedure: PROSTATECTOMY RETROPUBIC; SIMPLE OPEN PROSTATECTOMY;  Surgeon: James Fuller, MD;  Location: [facility] ORS;  Service: Urology;  Laterality: N/A;   REPAIR OF ACUTE ASCENDING THORACIC AORTIC DISSECTION N/A 09/17/2022   Procedure: REPAIR OF ACUTE ASCENDING THORACIC AORTIC DISSECTION  USING 28 MM HEMASHIELD PLATINUM VASCULAR GRAFT;  Surgeon: James Slot, MD;  Location: [facility] OR;  Service: Vascular;  Laterality: N/A;  Median sternotomy   TOTAL ELBOW REPLACEMENT Left    > 30 years ago   TOTAL HIP ARTHROPLASTY Right 09/08/2017   Procedure: RIGHT TOTAL HIP ARTHROPLASTY ANTERIOR APPROACH;  Surgeon: James Hitch, MD;  Location: [facility] ORS;  Service: Orthopedics;  Laterality: Right;   ULTRASOUND GUIDANCE FOR VASCULAR ACCESS Bilateral 05/04/2020   Procedure: ULTRASOUND GUIDANCE FOR VASCULAR ACCESS;  Surgeon: James Earthly, MD;  Location: [facility] OR;  Service: Vascular;  Laterality: Bilateral;   Patient Active Problem List   Diagnosis Date Noted   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) 10/26/2022   CVA (cerebral vascular accident) (HCC) 10/04/2022   Sepsis with acute renal failure without septic shock (HCC) 09/30/2022   S/P aortic dissection repair 09/18/2022   Status post surgery 09/17/2022   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) 05/22/2022   Colon cancer screening 05/22/2022   Essential hypertension 05/22/2022   Hemorrhoids 05/22/2022   Inguinal hernia 05/22/2022   Irritable bowel syndrome 05/22/2022   Mixed hyperlipidemia 05/22/2022   Pure hypercholesterolemia 05/22/2022   Thrombocytopenia (HCC) 05/22/2022   Allergic rhinitis 05/22/2022   AAA (abdominal aortic aneurysm) without rupture (HCC) 05/04/2020   Trochanteric bursitis, right hip 08/20/2018  History of right hip replacement 08/20/2018   Status post total replacement of right hip 09/08/2017   Pain of right hip joint 08/07/2017   Unilateral primary osteoarthritis, right hip 08/07/2017   BPH (benign prostatic hypertrophy) with urinary obstruction 05/15/2013    ONSET DATE: 09/17/22  REFERRING DIAG: R13.10 (ICD-10-CM) - Dysphagia, unspecified  THERAPY DIAG:  Cognitive communication deficit  Aphasia  Rationale for Evaluation and Treatment: Rehabilitation  SUBJECTIVE:   SUBJECTIVE  STATEMENT: "we did some rhyming and some fill in the blanks. Wanted to see if you could do it." PAIN:  Are you having pain? No  OBJECTIVE:  Pt is a 82 y/o M admitted to [facility] on 09/17/22 with type 1 aortic dissection requiring emergent repair. Head CT 11/6 acute infarcts within R MCA involvement, including R cerebellum, basal ganglia, frontal lobe. ETT 11/4-11/9. PMH AAA s/p endovascular repair (2020), HTN, HLD, GERD.   TODAY'S TREATMENT:                                                                                                                                         DATE:   06/21/23: James Estrada successfully took a variety of photos on his phone at various times. He pulled up the  photos to show me independently. He continues to report improvement on using his smart phone apps. Targeted reading comprehension, organization, reasoning, processing  organizing a schedule and trip with mildly complex written instructions and some deduction required. James Estrada requires consistent extended time and frequent mod verbal cues and written cues to complete tasks. Reading comprehension continues to be slow.  06/19/23: James Estrada continues to access music on his phone with rare min A and is accessing music at home with success. This week will target camera and photos on his smart phone with occasional min A. Targeted reading comprehension, organization and reasoning with frequent mod to max A and extended time on mildly complex deduction task. Targeted reasoning, problem solving, working memory and attention to details reading moderately complex golf course fees/schedule and money math - James Estrada required frequent visual cues of fees required and use of bookmark to read the chart correctly. Extended time and usual min to mod A for mental money math  06/12/23: James Estrada completed deduction HW his spouse requested. Targeted attention to detail, mental math and organization completing simple multiplication chart with 10's. 100's and  1000's places. [name] frequently required mod verbal, visual cues to complete mental math with correct zeros. Extended time required consistently. Targeted reasoning and organization in mildly complex math reasoning task which required usual min to mod visual/verbal cues. Auditory comprehension required extended time and occasional repetition. Generate strategies to compensate for attention, processing to successfully cook simple meals.   06/07/23: Reading comprehension, organization, attention to detail targeted using deduction puzzles, patient required consistent max-A of pointing gestures, extended wait time, repetition of complex directions. SLP provided semantic cues, and filling in part of  the answer in order to facilitate correct answers. Discussion led to find additional functional needs at home and in day to day life. Education provided on remembering names of doctors. Patient states that his goal is to type on the computer and drive a golf cart. Stated apprehension on getting back to golf, and provided education potential PT evaluation in the future.   06/05/23: Targeted reasoning, processing and following written directions in deduction chart - he required consistent mod to max A to follow simple directions with consistent extended time for processing. James Estrada benefits from covering parts of chart to improve processing speed and attention. In mildly complex card sort James Estrada required extended time for processing consistently and frequent cues to recall rules and follow rules as well as use reasoning and strategy.   05/31/23: Targeted following complex directions, reading comprehension, and executive functioning by finding mistakes on a fixed menu. Pt was able to find  mistakes and make accurate changes when provided with occasional min-A of prompting questions, extra processing time, phonemic cues, and immediate feedback. Patient demonstrated limited word finding and required occasional min-A of semantic  and visual cues. Patient stated double vision during the task, and advocated for a break, however he did well catching spelling errors during the session with 90% accuracy. Led through mental math to target following directions. Patient required occasional mod-A of visual clues to guide thinking and gestures to help in organization.   05/29/23: Targeted organization, reading comprehension, and following complex directions through mental math. SLP provided occasional min-A of guiding questions and reminders to check back over directions to increase attention to detail. Patient was able to follow complex directions with 20% accuracy independently, but could increase to 40% when provided with frequent max-A of additional processing time, repetition and simplification of directions, and immediate feedback. Patient struggled with cardinal directions during today's session. Targeted reasoning, and organizing with card sort 3 piles, 3 rules, patient required extended processing time, and frequent reminders, verbal and visual cues. HEP of rhyming and charts to increase cognitive flexibility.   05/24/23: Reading comprehension, organization, attention to detail targeted following complex written instructions to fill out a chart/log with frequent mod A and extended time. James Estrada benefited from cues to cross off each direction after he completed it. He requires consistent extra processing time. Targeted reasoning, attention and recall with card sort 2 piles, 2 rules - again extended time needed consistently, as well as frequent verbal cues and visual cues.       PATIENT EDUCATION: Education details: See patient instructions, see today's treatment, compensations for aphasia Person educated: Patient and Spouse Education method: Explanation, Verbal cues, and Handouts Education comprehension: verbal cues required and needs further education  GOALS: Goals reviewed with patient? Yes   SHORT TERM GOALS: Target date:  01/23/23   Pt will name 10 items in personally relevant categories over 3 sessions with occasional min A Baseline: Goal status: MET   2.  Pt will write 4 item grocery list with occasional min A Baseline:  Goal status: MET   3.  Pt will respond to 3 texts with occasional min A over 1 week Baseline:  Goal status: MET   4.  Pt will generate complex verbal sentences 3x using Scientist, product/process development with rare min A over 2 sessions Baseline:  Goal status: MET   5.  Pt will comprehend 4 sentence passage/email with extended time and rare min A Baseline:  Goal status: MET   6.  Pt will employ compensations  for aphasia in structured language tasks with occasional min A Baseline:  Goal status: MET   LONG TERM GOALS: Target date: 08/07/23   Pt will complete complex naming tasks with 80% accuracy and occasional min A Baseline:  Goal status: MET   2.  Pt will carryover verbal compensations for aphasia with occasional min A in conversation as needed over 2 sessions Baseline:  Goal status: MET   3.  Pt will carryover 3 strategies to successfully communicate in community with clerks, servers etc Baseline:  Goal status: MET   4.  Pt will write 3 sentence text or email with occasional min A, external aids and occasional min A  Baseline:  Goal status: MET   5.  Pt will improve score on Communicative Participation Item Bank by 4 points Baseline: 14/30 Goal status: MET  6. Pt will comprehend 3-5 paragraph story or article by accurately summarizing salient details in cohesive summary with rare min A             Goal Status: PARTIALLY MET  7. Pt will generate 5-7 sentence paragraph or email, Iding and correcting errors with occasional min A              Goal Status: MET  8. Pt will generate detailed, cohesive summary of a movie, news, article show or story requiring 3 or less requests for clarification               Goal Status: Ongoing  9. Pt will carryover 2 compensations  to make 3 phone calls to friends  or businesses with rare min A               Goal Status: MET  10. Pt will navigate music, photos and note section on his phone with occasional min A                Goal Status: PARTIALLY MET, note section deferred due to complex organization system  11. Pt will follow moderately complex written instructions/directions with occasional min A 8/10                 Goal Status: ONGOING  12. Pt will complex moderately complex organization, reasoning and deduction tasks with 80% accuracy and occasional min A                    Goal Status: ONGOING  James Estrada will cook moderately complex meal (main dish and side) using compensations for attention, processing and organization with rare min A                    Goal Status: NEW                             ASSESSMENT:   CLINICAL IMPRESSION:  Jamah  continues to make excellent progress and reports actively completing his HEP at home. Verbal expression for simple conversation is with mod I and mildly complex conversation with rare min A and occasional halting for word finding. Written expression at sentence level is accurate with rare min A and short paragraph level with occasional aphasic errors,  occasional mod A to ID and correct errors. However complex written sentences, and 5-6 sentence paragraphs continue to exhibit spelling, syntax and tangential errors.  Mild aphasia persists, however mild to moderate cognitive impairments continue to impact IADL's. Pt continues to benefit from ST intervention to maximize his communication and improve QoL at  home and in the community. Spouse is requesting to continue ST - They  identified Tranell used to [employer] but now is not due to cognitive impairments, as well as  reduced planning for outings and activities which requires spouse to prompt and follow up with James Estrada to ensure he has necessary supplies. Recommend Leonardo be re-certified to continue skilled ST to maximize cognitive  communication to successfully complete IADL's for independence, QOL and to reduce caregiver burden.  OBJECTIVE IMPAIRMENTS: include memory and aphasia. These impairments are limiting patient from managing medications, managing appointments, managing finances, household responsibilities, ADLs/IADLs, and effectively communicating at home and in community. Factors affecting potential to achieve goals and functional outcome are ability to learn/carryover information. Patient will benefit from skilled SLP services to address above impairments and improve overall function.   REHAB POTENTIAL: Good   PLAN:   SLP FREQUENCY: 2x/week   SLP DURATION: 12 weeks   PLANNED INTERVENTIONS: Language facilitation, Environmental controls, Cueing hierachy, Cognitive reorganization, Internal/external aids, Functional tasks, and Multimodal communication approach     , Radene Journey, CCC-SLP 06/21/2023, 12:24 PM

## 2023-06-26 ENCOUNTER — Encounter: Payer: Self-pay | Admitting: Speech Pathology

## 2023-06-26 ENCOUNTER — Ambulatory Visit: Payer: Medicare Other | Admitting: Speech Pathology

## 2023-06-26 DIAGNOSIS — R41841 Cognitive communication deficit: Secondary | ICD-10-CM | POA: Diagnosis not present

## 2023-06-26 DIAGNOSIS — R4701 Aphasia: Secondary | ICD-10-CM

## 2023-06-26 NOTE — Therapy (Signed)
OUTPATIENT SPEECH LANGUAGE PATHOLOGY TREATMENT NOTE   Patient Name: James Estrada MRN: [id number] DOB:Sep 17, 1941, 82 y.o., male Today's Date: 06/26/2023  PCP: Renford Dills, MD REFERRING PROVIDER: Renford Dills, MD  END OF SESSION:   End of Session - 06/26/23 1356     Visit Number 37    Number of Visits 77    Date for SLP Re-Evaluation 08/07/23    Authorization Type Medicare    SLP Start Time 1400    SLP Stop Time  1445    SLP Time Calculation (min) 45 min    Activity Tolerance Patient tolerated treatment well             Past Medical History:  Diagnosis Date   AAA (abdominal aortic aneurysm) (HCC)    last u/s done 07/18/17    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) 05/22/2022   Arthritis    Bradycardia    CVA (cerebral vascular accident) (HCC) 10/04/2022   Dysrhythmia    frequent PAC, for 20 years   GERD (gastroesophageal reflux disease)    occ, OTC   Hemorrhoids    History of hiatal hernia    Hyperlipidemia    Hypertension    S/P aortic dissection repair 09/18/2022   Seasonal allergies    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) 10/26/2022   Past Surgical History:  Procedure Laterality Date   ABDOMINAL AORTIC ENDOVASCULAR STENT GRAFT N/A 05/04/2020   Procedure: ABDOMINAL AORTIC ENDOVASCULAR STENT GRAFT;  Surgeon: Larina Earthly, MD;  Location: [facility] OR;  Service: Vascular;  Laterality: N/A;   CATARACT EXTRACTION Bilateral 2009   DIAGNOSTIC LAPAROSCOPY     laparoscopic hernia repair   EYE SURGERY Bilateral    cataract removal   HERNIA REPAIR Bilateral 1999, 2006   JOINT REPLACEMENT Right ~2018   hip replacement   pheochromocytoma  1993   PROSTATECTOMY N/A 05/15/2013   Procedure: PROSTATECTOMY RETROPUBIC; SIMPLE OPEN PROSTATECTOMY;  Surgeon: Valetta Fuller, MD;  Location: [facility] ORS;  Service: Urology;  Laterality: N/A;   REPAIR OF ACUTE ASCENDING THORACIC AORTIC DISSECTION N/A 09/17/2022   Procedure: REPAIR OF ACUTE ASCENDING THORACIC AORTIC DISSECTION  USING 28 MM HEMASHIELD PLATINUM VASCULAR GRAFT;  Surgeon: Loreli Slot, MD;  Location: [facility] OR;  Service: Vascular;  Laterality: N/A;  Median sternotomy   TOTAL ELBOW REPLACEMENT Left    > 30 years ago   TOTAL HIP ARTHROPLASTY Right 09/08/2017   Procedure: RIGHT TOTAL HIP ARTHROPLASTY ANTERIOR APPROACH;  Surgeon: Kathryne Hitch, MD;  Location: [facility] ORS;  Service: Orthopedics;  Laterality: Right;   ULTRASOUND GUIDANCE FOR VASCULAR ACCESS Bilateral 05/04/2020   Procedure: ULTRASOUND GUIDANCE FOR VASCULAR ACCESS;  Surgeon: Larina Earthly, MD;  Location[facility] OR;  Service: Vascular;  Laterality: Bilateral;   Patient Active Problem List   Diagnosis Date Noted   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) 10/26/2022   CVA (cerebral vascular accident) (HCC) 10/04/2022   Sepsis with acute renal failure without septic shock (HCC) 09/30/2022   S/P aortic dissection repair 09/18/2022   Status post surgery 09/17/2022   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) 05/22/2022   Colon cancer screening 05/22/2022   Essential hypertension 05/22/2022   Hemorrhoids 05/22/2022   Inguinal hernia 05/22/2022   Irritable bowel syndrome 05/22/2022   Mixed hyperlipidemia 05/22/2022   Pure hypercholesterolemia 05/22/2022   Thrombocytopenia (HCC) 05/22/2022   Allergic rhinitis 05/22/2022   AAA (abdominal aortic aneurysm) without rupture (HCC) 05/04/2020   Trochanteric bursitis, right hip 08/20/2018  History of right hip replacement 08/20/2018   Status post total replacement of right hip 09/08/2017   Pain of right hip joint 08/07/2017   Unilateral primary osteoarthritis, right hip 08/07/2017   BPH (benign prostatic hypertrophy) with urinary obstruction 05/15/2013    ONSET DATE: 09/17/22  REFERRING DIAG: R13.10 (ICD-10-CM) - Dysphagia, unspecified  THERAPY DIAG:  Cognitive communication deficit  Aphasia  Rationale for Evaluation and Treatment: Rehabilitation  SUBJECTIVE:  SUBJECTIVE  STATEMENT: "This is the first movie I have made in 6 months" PAIN:  Are you having pain? No  OBJECTIVE:  Pt is a 82 y/o M admitted to [facility] on 09/17/22 with type 1 aortic dissection requiring emergent repair. Head CT 11/6 acute infarcts within R MCA involvement, including R cerebellum, basal ganglia, frontal lobe. ETT 11/4-11/9. PMH AAA s/p endovascular repair (2020), HTN, HLD, GERD.   TODAY'S TREATMENT:                                                                                                                                         DATE:   06/26/23: James Estrada returns with a video of his home he took on his Iphone. He successfully opened the photo app and played the video with mod I, 2 re-tries. Targeted reading comprehension, mental math and organization in task with moderately complex instructions with simple mental math - James Estrada required occasional written cues, consistent extended time and frequent verbal cues and re-reading. He benefits from cues to follow 1 step in a complex direction at a time. He requires extended time for processing and visual supports for working memory. James Estrada will use strategies for attention, organization to cook more complex meal with main dish and side(s) with supervision from spouse.  8/7/24[name] successfully took a variety of photos on his phone at various times. He pulled up the  photos to show me independently. He continues to report improvement on using his smart phone apps. Targeted reading comprehension, organization, reasoning, processing  organizing a schedule and trip with mildly complex written instructions and some deduction required. James Estrada requires consistent extended time and frequent mod verbal cues and written cues to complete tasks. Reading comprehension continues to be slow.  06/19/23: James Estrada continues to access music on his phone with rare min A and is accessing music at home with success. This week will target camera and photos on his smart phone with  occasional min A. Targeted reading comprehension, organization and reasoning with frequent mod to max A and extended time on mildly complex deduction task. Targeted reasoning, problem solving, working memory and attention to details reading moderately complex golf course fees/schedule and money math - James Estrada required frequent visual cues of fees required and use of bookmark to read the chart correctly. Extended time and usual min to mod A for mental money math  06/12/23: James Estrada completed deduction HW his spouse requested. Targeted attention to detail, mental math and organization  completing simple multiplication chart with 10's. 100's and 1000's places. Tymari frequently required mod verbal, visual cues to complete mental math with correct zeros. Extended time required consistently. Targeted reasoning and organization in mildly complex math reasoning task which required usual min to mod visual/verbal cues. Auditory comprehension required extended time and occasional repetition. Generate strategies to compensate for attention, processing to successfully cook simple meals.   06/07/23: Reading comprehension, organization, attention to detail targeted using deduction puzzles, patient required consistent max-A of pointing gestures, extended wait time, repetition of complex directions. SLP provided semantic cues, and filling in part of the answer in order to facilitate correct answers. Discussion led to find additional functional needs at home and in day to day life. Education provided on remembering names of doctors. Patient states that his goal is to type on the computer and drive a golf cart. Stated apprehension on getting back to golf, and provided education potential PT evaluation in the future.   06/05/23: Targeted reasoning, processing and following written directions in deduction chart - he required consistent mod to max A to follow simple directions with consistent extended time for processing. James Estrada benefits  from covering parts of chart to improve processing speed and attention. In mildly complex card sort Cainan required extended time for processing consistently and frequent cues to recall rules and follow rules as well as use reasoning and strategy.   05/31/23: Targeted following complex directions, reading comprehension, and executive functioning by finding mistakes on a fixed menu. Pt was able to find  mistakes and make accurate changes when provided with occasional min-A of prompting questions, extra processing time, phonemic cues, and immediate feedback. Patient demonstrated limited word finding and required occasional min-A of semantic and visual cues. Patient stated double vision during the task, and advocated for a break, however he did well catching spelling errors during the session with 90% accuracy. Led through mental math to target following directions. Patient required occasional mod-A of visual clues to guide thinking and gestures to help in organization.   05/29/23: Targeted organization, reading comprehension, and following complex directions through mental math. SLP provided occasional min-A of guiding questions and reminders to check back over directions to increase attention to detail. Patient was able to follow complex directions with 20% accuracy independently, but could increase to 40% when provided with frequent max-A of additional processing time, repetition and simplification of directions, and immediate feedback. Patient struggled with cardinal directions during today's session. Targeted reasoning, and organizing with card sort 3 piles, 3 rules, patient required extended processing time, and frequent reminders, verbal and visual cues. HEP of rhyming and charts to increase cognitive flexibility.     PATIENT EDUCATION: Education details: See patient instructions, see today's treatment, compensations for aphasia Person educated: Patient and Spouse Education method: Explanation, Verbal  cues, and Handouts Education comprehension: verbal cues required and needs further education  GOALS: Goals reviewed with patient? Yes   SHORT TERM GOALS: Target date: 01/23/23   Pt will name 10 items in personally relevant categories over 3 sessions with occasional min A Baseline: Goal status: MET   2.  Pt will write 4 item grocery list with occasional min A Baseline:  Goal status: MET   3.  Pt will respond to 3 texts with occasional min A over 1 week Baseline:  Goal status: MET   4.  Pt will generate complex verbal sentences 3x using Scientist, product/process development with rare min A over 2 sessions Baseline:  Goal status: MET  5.  Pt will comprehend 4 sentence passage/email with extended time and rare min A Baseline:  Goal status: MET   6.  Pt will employ compensations for aphasia in structured language tasks with occasional min A Baseline:  Goal status: MET   LONG TERM GOALS: Target date: 08/07/23   Pt will complete complex naming tasks with 80% accuracy and occasional min A Baseline:  Goal status: MET   2.  Pt will carryover verbal compensations for aphasia with occasional min A in conversation as needed over 2 sessions Baseline:  Goal status: MET   3.  Pt will carryover 3 strategies to successfully communicate in community with clerks, servers etc Baseline:  Goal status: MET   4.  Pt will write 3 sentence text or email with occasional min A, external aids and occasional min A  Baseline:  Goal status: MET   5.  Pt will improve score on Communicative Participation Item Bank by 4 points Baseline: 14/30 Goal status: MET  6. Pt will comprehend 3-5 paragraph story or article by accurately summarizing salient details in cohesive summary with rare min A             Goal Status: PARTIALLY MET  7. Pt will generate 5-7 sentence paragraph or email, Iding and correcting errors with occasional min A              Goal Status: MET  8. Pt will generate detailed,  cohesive summary of a movie, news, article show or story requiring 3 or less requests for clarification               Goal Status: Ongoing  9. Pt will carryover 2 compensations to make 3 phone calls to friends  or businesses with rare min A               Goal Status: MET  10. Pt will navigate music, photos and note section on his phone with occasional min A                Goal Status: PARTIALLY MET, note section deferred due to complex organization system  11. Pt will follow moderately complex written instructions/directions with occasional min A 8/10                 Goal Status: ONGOING  12. Pt will complex moderately complex organization, reasoning and deduction tasks with 80% accuracy and occasional min A                    Goal Status: ONGOING  13. Taariq will cook moderately complex meal (main dish and side) using compensations for attention, processing and organization with rare min A                    Goal Status: ONGOING                             ASSESSMENT:   CLINICAL IMPRESSION:  James Estrada  continues to make excellent progress and reports actively completing his HEP at home. Verbal expression for simple conversation is with mod I and mildly complex conversation with rare min A and occasional halting for word finding. Written expression at sentence level is accurate with rare min A and short paragraph level with occasional aphasic errors,  occasional mod A to ID and correct errors. However complex written sentences, and 5-6 sentence paragraphs continue to exhibit spelling, syntax and tangential errors.  Mild aphasia persists, however mild to moderate cognitive impairments continue to impact IADL's. Pt continues to benefit from ST intervention to maximize his communication and improve QoL at home and in the community. Spouse is requesting to continue ST - They  identified James Estrada used to [employer] but now is not due to cognitive impairments, as well as  reduced planning for outings and  activities which requires spouse to prompt and follow up with James Estrada to ensure he has necessary supplies. Recommend Torrie be re-certified to continue skilled ST to maximize cognitive communication to successfully complete IADL's for independence, QOL and to reduce caregiver burden.  OBJECTIVE IMPAIRMENTS: include memory and aphasia. These impairments are limiting patient from managing medications, managing appointments, managing finances, household responsibilities, ADLs/IADLs, and effectively communicating at home and in community. Factors affecting potential to achieve goals and functional outcome are ability to learn/carryover information. Patient will benefit from skilled SLP services to address above impairments and improve overall function.   REHAB POTENTIAL: Good   PLAN:   SLP FREQUENCY: 2x/week   SLP DURATION: 12 weeks   PLANNED INTERVENTIONS: Language facilitation, Environmental controls, Cueing hierachy, Cognitive reorganization, Internal/external aids, Functional tasks, and Multimodal communication approach     , Radene Journey, CCC-SLP 06/26/2023, 3:14 PM

## 2023-06-28 ENCOUNTER — Encounter: Payer: Self-pay | Admitting: Speech Pathology

## 2023-06-28 ENCOUNTER — Ambulatory Visit: Payer: Medicare Other | Admitting: Speech Pathology

## 2023-06-28 DIAGNOSIS — R41841 Cognitive communication deficit: Secondary | ICD-10-CM

## 2023-06-28 DIAGNOSIS — R4701 Aphasia: Secondary | ICD-10-CM

## 2023-06-28 NOTE — Therapy (Signed)
OUTPATIENT SPEECH LANGUAGE PATHOLOGY TREATMENT NOTE   Patient Name: James Estrada MRN: [id number] DOB:27-Oct-1941, 82 y.o., male Today's Date: 06/28/2023  PCP: Renford Dills, MD REFERRING PROVIDER: Renford Dills, MD  END OF SESSION:   End of Session - 06/28/23 1406     Visit Number 38    Number of Visits 77    Date for SLP Re-Evaluation 08/07/23    Authorization Type Medicare    SLP Start Time 1400    SLP Stop Time  1445    SLP Time Calculation (min) 45 min    Activity Tolerance Patient tolerated treatment well             Past Medical History:  Diagnosis Date   AAA (abdominal aortic aneurysm) (HCC)    last u/s done 07/18/17    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) 05/22/2022   Arthritis    Bradycardia    CVA (cerebral vascular accident) (HCC) 10/04/2022   Dysrhythmia    frequent PAC, for 20 years   GERD (gastroesophageal reflux disease)    occ, OTC   Hemorrhoids    History of hiatal hernia    Hyperlipidemia    Hypertension    S/P aortic dissection repair 09/18/2022   Seasonal allergies    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) 10/26/2022   Past Surgical History:  Procedure Laterality Date   ABDOMINAL AORTIC ENDOVASCULAR STENT GRAFT N/A 05/04/2020   Procedure: ABDOMINAL AORTIC ENDOVASCULAR STENT GRAFT;  Surgeon: Larina Earthly, MD;  Location: [facility] OR;  Service: Vascular;  Laterality: N/A;   CATARACT EXTRACTION Bilateral 2009   DIAGNOSTIC LAPAROSCOPY     laparoscopic hernia repair   EYE SURGERY Bilateral    cataract removal   HERNIA REPAIR Bilateral 1999, 2006   JOINT REPLACEMENT Right ~2018   hip replacement   pheochromocytoma  1993   PROSTATECTOMY N/A 05/15/2013   Procedure: PROSTATECTOMY RETROPUBIC; SIMPLE OPEN PROSTATECTOMY;  Surgeon: Valetta Fuller, MD;  Location: [facility];  Service: Urology;  Laterality: N/A;   REPAIR OF ACUTE ASCENDING THORACIC AORTIC DISSECTION N/A 09/17/2022   Procedure: REPAIR OF ACUTE ASCENDING THORACIC AORTIC DISSECTION  USING 28 MM HEMASHIELD PLATINUM VASCULAR GRAFT;  Surgeon: Loreli Slot, MD;  Location: [facility] OR;  Service: Vascular;  Laterality: N/A;  Median sternotomy   TOTAL ELBOW REPLACEMENT Left    > 30 years ago   TOTAL HIP ARTHROPLASTY Right 09/08/2017   Procedure: RIGHT TOTAL HIP ARTHROPLASTY ANTERIOR APPROACH;  Surgeon: Kathryne Hitch, MD;  Location: [facility];  Service: Orthopedics;  Laterality: Right;   ULTRASOUND GUIDANCE FOR VASCULAR ACCESS Bilateral 05/04/2020   Procedure: ULTRASOUND GUIDANCE FOR VASCULAR ACCESS;  Surgeon: Larina Earthly, MD;  Location: [facility] OR;  Service: Vascular;  Laterality: Bilateral;   Patient Active Problem List   Diagnosis Date Noted   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) 10/26/2022   CVA (cerebral vascular accident) (HCC) 10/04/2022   Sepsis with acute renal failure without septic shock (HCC) 09/30/2022   S/P aortic dissection repair 09/18/2022   Status post surgery 09/17/2022   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) 05/22/2022   Colon cancer screening 05/22/2022   Essential hypertension 05/22/2022   Hemorrhoids 05/22/2022   Inguinal hernia 05/22/2022   Irritable bowel syndrome 05/22/2022   Mixed hyperlipidemia 05/22/2022   Pure hypercholesterolemia 05/22/2022   Thrombocytopenia (HCC) 05/22/2022   Allergic rhinitis 05/22/2022   AAA (abdominal aortic aneurysm) without rupture (HCC) 05/04/2020   Trochanteric bursitis, right hip 08/20/2018  History of right hip replacement 08/20/2018   Status post total replacement of right hip 09/08/2017   Pain of right hip joint 08/07/2017   Unilateral primary osteoarthritis, right hip 08/07/2017   BPH (benign prostatic hypertrophy) with urinary obstruction 05/15/2013    ONSET DATE: 09/17/22  REFERRING DIAG: R13.10 (ICD-10-CM) - Dysphagia, unspecified  THERAPY DIAG:  Cognitive communication deficit  Aphasia  Rationale for Evaluation and Treatment: Rehabilitation  SUBJECTIVE:  SUBJECTIVE  STATEMENT: "This is the first movie I have made in 6 months" PAIN:  Are you having pain? No  OBJECTIVE:  Pt is a 82 y/o M admitted to [facility] on 09/17/22 with type 1 aortic dissection requiring emergent repair. Head CT 11/6 acute infarcts within R MCA involvement, including R cerebellum, basal ganglia, frontal lobe. ETT 11/4-11/9. PMH AAA s/p endovascular repair (2020), HTN, HLD, GERD.   TODAY'S TREATMENT:                                                                                                                                         DATE:   06/28/23: Targeted reasoning, problem solving, processing and attention to details in moderately complex deduction task. James Estrada requires extended time frequently, usual mod verbal cues and min written/visual cues for attention and processing. Attention to detail and mental time math and reading comprehension targeted reading pool schedule with details and answering time questions. James Estrada required occasional mod verbal cues and visual cues to attend to details, usual extended time for time math and verbal cues to ID math errors.   06/26/23: James Estrada returns with a video of his home he took on his Iphone. He successfully opened the photo app and played the video with mod I, 2 re-tries. Targeted reading comprehension, mental math and organization in task with moderately complex instructions with simple mental math - James Estrada required occasional written cues, consistent extended time and frequent verbal cues and re-reading. He benefits from cues to follow 1 step in a complex direction at a time. He requires extended time for processing and visual supports for working memory. James Estrada will use strategies for attention, organization to cook more complex meal with main dish and side(s) with supervision from spouse.  8/7/24[name] successfully took a variety of photos on his phone at various times. He pulled up the  photos to show me independently. He continues to report  improvement on using his smart phone apps. Targeted reading comprehension, organization, reasoning, processing  organizing a schedule and trip with mildly complex written instructions and some deduction required. James Estrada requires consistent extended time and frequent mod verbal cues and written cues to complete tasks. Reading comprehension continues to be slow.  06/19/23: James Estrada continues to access music on his phone with rare min A and is accessing music at home with success. This week will target camera and photos on his smart phone with occasional min A. Targeted reading comprehension, organization and reasoning  with frequent mod to max A and extended time on mildly complex deduction task. Targeted reasoning, problem solving, working memory and attention to details reading moderately complex golf course fees/schedule and money math - James Estrada required frequent visual cues of fees required and use of bookmark to read the chart correctly. Extended time and usual min to mod A for mental money math  06/12/23: James Estrada completed deduction HW his spouse requested. Targeted attention to detail, mental math and organization completing simple multiplication chart with 10's. 100's and 1000's places. James Estrada frequently required mod verbal, visual cues to complete mental math with correct zeros. Extended time required consistently. Targeted reasoning and organization in mildly complex math reasoning task which required usual min to mod visual/verbal cues. Auditory comprehension required extended time and occasional repetition. Generate strategies to compensate for attention, processing to successfully cook simple meals.   06/07/23: Reading comprehension, organization, attention to detail targeted using deduction puzzles, patient required consistent max-A of pointing gestures, extended wait time, repetition of complex directions. SLP provided semantic cues, and filling in part of the answer in order to facilitate correct answers.  Discussion led to find additional functional needs at home and in day to day life. Education provided on remembering names of doctors. Patient states that his goal is to type on the computer and drive a golf cart. Stated apprehension on getting back to golf, and provided education potential PT evaluation in the future.   06/05/23: Targeted reasoning, processing and following written directions in deduction chart - he required consistent mod to max A to follow simple directions with consistent extended time for processing. James Estrada benefits from covering parts of chart to improve processing speed and attention. In mildly complex card sort [place] required extended time for processing consistently and frequent cues to recall rules and follow rules as well as use reasoning and strategy.      PATIENT EDUCATION: Education details: See patient instructions, see today's treatment, compensations for aphasia Person educated: Patient and Spouse Education method: Explanation, Verbal cues, and Handouts Education comprehension: verbal cues required and needs further education  GOALS: Goals reviewed with patient? Yes   SHORT TERM GOALS: Target date: 01/23/23   Pt will name 10 items in personally relevant categories over 3 sessions with occasional min A Baseline: Goal status: MET   2.  Pt will write 4 item grocery list with occasional min A Baseline:  Goal status: MET   3.  Pt will respond to 3 texts with occasional min A over 1 week Baseline:  Goal status: MET   4.  Pt will generate complex verbal sentences 3x using Scientist, product/process development with rare min A over 2 sessions Baseline:  Goal status: MET   5.  Pt will comprehend 4 sentence passage/email with extended time and rare min A Baseline:  Goal status: MET   6.  Pt will employ compensations for aphasia in structured language tasks with occasional min A Baseline:  Goal status: MET   LONG TERM GOALS: Target date: 08/07/23   Pt  will complete complex naming tasks with 80% accuracy and occasional min A Baseline:  Goal status: MET   2.  Pt will carryover verbal compensations for aphasia with occasional min A in conversation as needed over 2 sessions Baseline:  Goal status: MET   3.  Pt will carryover 3 strategies to successfully communicate in community with clerks, servers etc Baseline:  Goal status: MET   4.  Pt will write 3 sentence text or email with  occasional min A, external aids and occasional min A  Baseline:  Goal status: MET   5.  Pt will improve score on Communicative Participation Item Bank by 4 points Baseline: 14/30 Goal status: MET  6. Pt will comprehend 3-5 paragraph story or article by accurately summarizing salient details in cohesive summary with rare min A             Goal Status: PARTIALLY MET  7. Pt will generate 5-7 sentence paragraph or email, Iding and correcting errors with occasional min A              Goal Status: MET  8. Pt will generate detailed, cohesive summary of a movie, news, article show or story requiring 3 or less requests for clarification               Goal Status: Ongoing  9. Pt will carryover 2 compensations to make 3 phone calls to friends  or businesses with rare min A               Goal Status: MET  10. Pt will navigate music, photos and note section on his phone with occasional min A                Goal Status: PARTIALLY MET, note section deferred due to complex organization system  11. Pt will follow moderately complex written instructions/directions with occasional min A 8/10                 Goal Status: ONGOING  12. Pt will complex moderately complex organization, reasoning and deduction tasks with 80% accuracy and occasional min A                    Goal Status: ONGOING  13. James Estrada will cook moderately complex meal (main dish and side) using compensations for attention, processing and organization with rare min A                    Goal Status:  ONGOING                             ASSESSMENT:   CLINICAL IMPRESSION:  James Estrada  continues to make excellent progress and reports actively completing his HEP at home. Verbal expression for simple conversation is with mod I and mildly complex conversation with rare min A and occasional halting for word finding. Written expression at sentence level is accurate with rare min A and short paragraph level with occasional aphasic errors,  occasional mod A to ID and correct errors. However complex written sentences, and 5-6 sentence paragraphs continue to exhibit spelling, syntax and tangential errors.  Mild aphasia persists, however mild to moderate cognitive impairments continue to impact IADL's. Pt continues to benefit from ST intervention to maximize his communication and improve QoL at home and in the community. Spouse is requesting to continue ST - They  identified Taavi used to [employer] but now is not due to cognitive impairments, as well as  reduced planning for outings and activities which requires spouse to prompt and follow up with James Estrada to ensure he has necessary supplies. Recommend Alfonsa be re-certified to continue skilled ST to maximize cognitive communication to successfully complete IADL's for independence, QOL and to reduce caregiver burden.  OBJECTIVE IMPAIRMENTS: include memory and aphasia. These impairments are limiting patient from managing medications, managing appointments, managing finances, household responsibilities, ADLs/IADLs, and effectively communicating at home and  in community. Factors affecting potential to achieve goals and functional outcome are ability to learn/carryover information. Patient will benefit from skilled SLP services to address above impairments and improve overall function.   REHAB POTENTIAL: Good   PLAN:   SLP FREQUENCY: 2x/week   SLP DURATION: 12 weeks   PLANNED INTERVENTIONS: Language facilitation, Environmental controls, Cueing hierachy, Cognitive  reorganization, Internal/external aids, Functional tasks, and Multimodal communication approach     , Radene Journey, CCC-SLP 06/28/2023, 3:02 PM

## 2023-06-29 ENCOUNTER — Encounter: Payer: Medicare Other | Attending: Physical Medicine & Rehabilitation | Admitting: Physical Medicine & Rehabilitation

## 2023-06-29 ENCOUNTER — Encounter: Payer: Self-pay | Admitting: Physical Medicine & Rehabilitation

## 2023-06-29 VITALS — BP 131/72 | HR 71 | Ht 72.0 in | Wt 179.0 lb

## 2023-06-29 DIAGNOSIS — I69320 Aphasia following cerebral infarction: Secondary | ICD-10-CM | POA: Diagnosis not present

## 2023-06-29 DIAGNOSIS — I69398 Other sequelae of cerebral infarction: Secondary | ICD-10-CM | POA: Diagnosis not present

## 2023-06-29 DIAGNOSIS — R269 Unspecified abnormalities of gait and mobility: Secondary | ICD-10-CM | POA: Diagnosis not present

## 2023-06-29 DIAGNOSIS — R338 Other retention of urine: Secondary | ICD-10-CM | POA: Diagnosis not present

## 2023-06-29 DIAGNOSIS — N401 Enlarged prostate with lower urinary tract symptoms: Secondary | ICD-10-CM | POA: Diagnosis not present
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
## 2023-06-29 NOTE — Patient Instructions (Signed)
Please ask Dr Shelly Rubenstein about any other restriction

## 2023-07-03 ENCOUNTER — Ambulatory Visit: Payer: Medicare Other | Admitting: Speech Pathology

## 2023-07-03 ENCOUNTER — Encounter: Payer: Self-pay | Admitting: Speech Pathology

## 2023-07-03 DIAGNOSIS — R41841 Cognitive communication deficit: Secondary | ICD-10-CM | POA: Diagnosis not present

## 2023-07-03 DIAGNOSIS — R4701 Aphasia: Secondary | ICD-10-CM | POA: Diagnosis not present

## 2023-07-03 NOTE — Therapy (Signed)
OUTPATIENT SPEECH LANGUAGE PATHOLOGY TREATMENT NOTE   Patient Name: James Estrada MRN: [id number] DOB:September 20, 1941, 82 y.o., male Today's Date: 07/03/2023  PCP: Renford Dills, MD REFERRING PROVIDER: Renford Dills, MD  END OF SESSION:   End of Session - 07/03/23 1409     Visit Number [id number]    Number of Visits 77    Date for SLP Re-Evaluation 08/07/23    SLP Start Time 1400    SLP Stop Time  1445    SLP Time Calculation (min) 45 min    Activity Tolerance Patient tolerated treatment well             Past Medical History:  Diagnosis Date   AAA (abdominal aortic aneurysm) (HCC)    last u/s done 07/18/17    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) 05/22/2022   Arthritis    Bradycardia    CVA (cerebral vascular accident) (HCC) 10/04/2022   Dysrhythmia    frequent PAC, for 20 years   GERD (gastroesophageal reflux disease)    occ, OTC   Hemorrhoids    History of hiatal hernia    Hyperlipidemia    Hypertension    S/P aortic dissection repair 09/18/2022   Seasonal allergies    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) 10/26/2022   Past Surgical History:  Procedure Laterality Date   ABDOMINAL AORTIC ENDOVASCULAR STENT GRAFT N/A 05/04/2020   Procedure: ABDOMINAL AORTIC ENDOVASCULAR STENT GRAFT;  Surgeon: Larina Earthly, MD;  Location: [facility] OR;  Service: Vascular;  Laterality: N/A;   CATARACT EXTRACTION Bilateral 2009   DIAGNOSTIC LAPAROSCOPY     laparoscopic hernia repair   EYE SURGERY Bilateral    cataract removal   HERNIA REPAIR Bilateral 1999, 2006   JOINT REPLACEMENT Right ~2018   hip replacement   pheochromocytoma  1993   PROSTATECTOMY N/A 05/15/2013   Procedure: PROSTATECTOMY RETROPUBIC; SIMPLE OPEN PROSTATECTOMY;  Surgeon: Valetta Fuller, MD;  Location: [facility] ORS;  Service: Urology;  Laterality: N/A;   REPAIR OF ACUTE ASCENDING THORACIC AORTIC DISSECTION N/A 09/17/2022   Procedure: REPAIR OF ACUTE ASCENDING THORACIC AORTIC DISSECTION USING 28 MM HEMASHIELD PLATINUM  VASCULAR GRAFT;  Surgeon: Loreli Slot, MD;  Location: [facility] OR;  Service: Vascular;  Laterality: N/A;  Median sternotomy   TOTAL ELBOW REPLACEMENT Left    > 30 years ago   TOTAL HIP ARTHROPLASTY Right 09/08/2017   Procedure: RIGHT TOTAL HIP ARTHROPLASTY ANTERIOR APPROACH;  Surgeon: Kathryne Hitch, MD;  Location: [facility] ORS;  Service: Orthopedics;  Laterality: Right;   ULTRASOUND GUIDANCE FOR VASCULAR ACCESS Bilateral 05/04/2020   Procedure: ULTRASOUND GUIDANCE FOR VASCULAR ACCESS;  Surgeon: Larina Earthly, MD;  Location: [facility] OR;  Service: Vascular;  Laterality: Bilateral;   Patient Active Problem List   Diagnosis Date Noted   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) 10/26/2022   CVA (cerebral vascular accident) (HCC) 10/04/2022   Sepsis with acute renal failure without septic shock (HCC) 09/30/2022   S/P aortic dissection repair 09/18/2022   Status post surgery 09/17/2022   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) 05/22/2022   Colon cancer screening 05/22/2022   Essential hypertension 05/22/2022   Hemorrhoids 05/22/2022   Inguinal hernia 05/22/2022   Irritable bowel syndrome 05/22/2022   Mixed hyperlipidemia 05/22/2022   Pure hypercholesterolemia 05/22/2022   Thrombocytopenia (HCC) 05/22/2022   Allergic rhinitis 05/22/2022   AAA (abdominal aortic aneurysm) without rupture (HCC) 05/04/2020   Trochanteric bursitis, right hip 08/20/2018   History of right hip  replacement 08/20/2018   Status post total replacement of right hip 09/08/2017   Pain of right hip joint 08/07/2017   Unilateral primary osteoarthritis, right hip 08/07/2017   BPH (benign prostatic hypertrophy) with urinary obstruction 05/15/2013    ONSET DATE: 09/17/22  REFERRING DIAG: R13.10 (ICD-10-CM) - Dysphagia, unspecified  THERAPY DIAG:  Cognitive communication deficit  Aphasia  Rationale for Evaluation and Treatment: Rehabilitation  SUBJECTIVE:  SUBJECTIVE STATEMENT: "Dr. Wynn Banker signed  off" PAIN:  Are you having pain? No  OBJECTIVE:  Pt is a 82 y/o M admitted [facility] on 09/17/22 with type 1 aortic dissection requiring emergent repair. Head CT 11/6 acute infarcts within R MCA involvement, including R cerebellum, basal ganglia, frontal lobe. ETT 11/4-11/9. PMH AAA s/p endovascular repair (2020), HTN, HLD, GERD.   TODAY'S TREATMENT:                                                                                                                                         DATE:   06/29/23: James Estrada cooks salmon and made salmon salad independently. Reading comprehension, mental money math, attention to details reading menu, adding prices and making change - James Estrada required occasional min A for organization and to ID 1 error, otherwise 4/5 correct. Organization, reasoning and attention to detail targeted in mildly complex deduction puzzle, James Estrada required usual min to mod A (verbal cues, pointing cues)  to complete task with 90% accuracy.   06/28/23: Targeted reasoning, problem solving, processing and attention to details in moderately complex deduction task. Sharod requires extended time frequently, usual mod verbal cues and min written/visual cues for attention and processing. Attention to detail and mental time math and reading comprehension targeted reading pool schedule with details and answering time questions. James Estrada required occasional mod verbal cues and visual cues to attend to details, usual extended time for time math and verbal cues to ID math errors.   06/26/23: James Estrada returns with a video of his home he took on his Iphone. He successfully opened the photo app and played the video with mod I, 2 re-tries. Targeted reading comprehension, mental math and organization in task with moderately complex instructions with simple mental math - James Estrada required occasional written cues, consistent extended time and frequent verbal cues and re-reading. He benefits from cues to follow 1 step in a  complex direction at a time. He requires extended time for processing and visual supports for working memory. James Estrada will use strategies for attention, organization to cook more complex meal with main dish and side(s) with supervision from spouse.  8/7/24[name] successfully took a variety of photos on his phone at various times. He pulled up the  photos to show me independently. He continues to report improvement on using his smart phone apps. Targeted reading comprehension, organization, reasoning, processing  organizing a schedule and trip with mildly complex written instructions and some deduction required. James Estrada requires consistent extended  time and frequent mod verbal cues and written cues to complete tasks. Reading comprehension continues to be slow.  06/19/23: James Estrada continues to access music on his phone with rare min A and is accessing music at home with success. This week will target camera and photos on his smart phone with occasional min A. Targeted reading comprehension, organization and reasoning with frequent mod to max A and extended time on mildly complex deduction task. Targeted reasoning, problem solving, working memory and attention to details reading moderately complex golf course fees/schedule and money math - James Estrada required frequent visual cues of fees required and use of bookmark to read the chart correctly. Extended time and usual min to mod A for mental money math  06/12/23: James Estrada completed deduction HW his spouse requested. Targeted attention to detail, mental math and organization completing simple multiplication chart with 10's. 100's and 1000's places. James Estrada frequently required mod verbal, visual cues to complete mental math with correct zeros. Extended time required consistently. Targeted reasoning and organization in mildly complex math reasoning task which required usual min to mod visual/verbal cues. Auditory comprehension required extended time and occasional repetition.  Generate strategies to compensate for attention, processing to successfully cook simple meals.   06/07/23: Reading comprehension, organization, attention to detail targeted using deduction puzzles, patient required consistent max-A of pointing gestures, extended wait time, repetition of complex directions. SLP provided semantic cues, and filling in part of the answer in order to facilitate correct answers. Discussion led to find additional functional needs at home and in day to day life. Education provided on remembering names of doctors. Patient states that his goal is to type on the computer and drive a golf cart. Stated apprehension on getting back to golf, and provided education potential PT evaluation in the future.   06/05/23: Targeted reasoning, processing and following written directions in deduction chart - he required consistent mod to max A to follow simple directions with consistent extended time for processing. James Estrada benefits from covering parts of chart to improve processing speed and attention. In mildly complex card sort James Estrada required extended time for processing consistently and frequent cues to recall rules and follow rules as well as use reasoning and strategy.      PATIENT EDUCATION: Education details: See patient instructions, see today's treatment, compensations for aphasia Person educated: Patient and Spouse Education method: Explanation, Verbal cues, and Handouts Education comprehension: verbal cues required and needs further education  GOALS: Goals reviewed with patient? Yes   SHORT TERM GOALS: Target date: 01/23/23   Pt will name 10 items in personally relevant categories over 3 sessions with occasional min A Baseline: Goal status: MET   2.  Pt will write 4 item grocery list with occasional min A Baseline:  Goal status: MET   3.  Pt will respond to 3 texts with occasional min A over 1 week Baseline:  Goal status: MET   4.  Pt will generate complex verbal  sentences 3x using Scientist, product/process development with rare min A over 2 sessions Baseline:  Goal status: MET   5.  Pt will comprehend 4 sentence passage/email with extended time and rare min A Baseline:  Goal status: MET   6.  Pt will employ compensations for aphasia in structured language tasks with occasional min A Baseline:  Goal status: MET   LONG TERM GOALS: Target date: 08/07/23   Pt will complete complex naming tasks with 80% accuracy and occasional min A Baseline:  Goal status: MET  2.  Pt will carryover verbal compensations for aphasia with occasional min A in conversation as needed over 2 sessions Baseline:  Goal status: MET   3.  Pt will carryover 3 strategies to successfully communicate in community with clerks, servers etc Baseline:  Goal status: MET   4.  Pt will write 3 sentence text or email with occasional min A, external aids and occasional min A  Baseline:  Goal status: MET   5.  Pt will improve score on Communicative Participation Item Bank by 4 points Baseline: 14/30 Goal status: MET  6. Pt will comprehend 3-5 paragraph story or article by accurately summarizing salient details in cohesive summary with rare min A             Goal Status: PARTIALLY MET  7. Pt will generate 5-7 sentence paragraph or email, Iding and correcting errors with occasional min A              Goal Status: MET  8. Pt will generate detailed, cohesive summary of a movie, news, article show or story requiring 3 or less requests for clarification               Goal Status: Ongoing  9. Pt will carryover 2 compensations to make 3 phone calls to friends  or businesses with rare min A               Goal Status: MET  10. Pt will navigate music, photos and note section on his phone with occasional min A                Goal Status: PARTIALLY MET, note section deferred due to complex organization system  11. Pt will follow moderately complex written instructions/directions with  occasional min A 8/10                 Goal Status: ONGOING  12. Pt will complex moderately complex organization, reasoning and deduction tasks with 80% accuracy and occasional min A                    Goal Status: ONGOING  13. James Estrada will cook moderately complex meal (main dish and side) using compensations for attention, processing and organization with rare min A                    Goal Status: MET                             ASSESSMENT:   CLINICAL IMPRESSION:  James Estrada  continues to make excellent progress and reports actively completing his HEP at home. Verbal expression for simple conversation is with mod I and mildly complex conversation with rare min A and occasional halting for word finding. Written expression at sentence level is accurate with rare min A and short paragraph level with occasional aphasic errors,  occasional mod A to ID and correct errors. However complex written sentences, and 5-6 sentence paragraphs continue to exhibit spelling, syntax and tangential errors.  Mild aphasia persists, however mild to moderate cognitive impairments continue to impact IADL's. Pt continues to benefit from ST intervention to maximize his communication and improve QoL at home and in the community. Spouse is requesting to continue ST - They  identified James Estrada used to [employer] but now is not due to cognitive impairments, as well as  reduced planning for outings and activities which requires spouse to prompt and follow  up with James Estrada to ensure he has necessary supplies. Recommend James Estrada be re-certified to continue skilled ST to maximize cognitive communication to successfully complete IADL's for independence, QOL and to reduce caregiver burden.  OBJECTIVE IMPAIRMENTS: include memory and aphasia. These impairments are limiting patient from managing medications, managing appointments, managing finances, household responsibilities, ADLs/IADLs, and effectively communicating at home and in community. Factors  affecting potential to achieve goals and functional outcome are ability to learn/carryover information. Patient will benefit from skilled SLP services to address above impairments and improve overall function.   REHAB POTENTIAL: Good   PLAN:   SLP FREQUENCY: 2x/week   SLP DURATION: 12 weeks   PLANNED INTERVENTIONS: Language facilitation, Environmental controls, Cueing hierachy, Cognitive reorganization, Internal/external aids, Functional tasks, and Multimodal communication approach     Creta Dorame, Radene Journey, CCC-SLP 07/03/2023, 2:50 PM

## 2023-07-04 DIAGNOSIS — R339 Retention of urine, unspecified: Secondary | ICD-10-CM | POA: Diagnosis not present

## 2023-07-04 DIAGNOSIS — N319 Neuromuscular dysfunction of bladder, unspecified: Secondary | ICD-10-CM | POA: Diagnosis not present

## 2023-07-05 ENCOUNTER — Ambulatory Visit: Payer: Medicare Other | Admitting: Speech Pathology

## 2023-07-05 DIAGNOSIS — Z0000 Encounter for general adult medical examination without abnormal findings: Secondary | ICD-10-CM | POA: Diagnosis not present

## 2023-07-05 DIAGNOSIS — F324 Major depressive disorder, single episode, in partial remission: Secondary | ICD-10-CM | POA: Diagnosis not present

## 2023-07-05 DIAGNOSIS — I10 Essential (primary) hypertension: Secondary | ICD-10-CM | POA: Diagnosis not present

## 2023-07-05 DIAGNOSIS — I6340 Cerebral infarction due to embolism of unspecified cerebral artery: Secondary | ICD-10-CM | POA: Diagnosis not present

## 2023-07-05 DIAGNOSIS — R339 Retention of urine, unspecified: Secondary | ICD-10-CM | POA: Diagnosis not present

## 2023-07-05 DIAGNOSIS — Z8679 Personal history of other diseases of the circulatory system: Secondary | ICD-10-CM | POA: Diagnosis not present

## 2023-07-05 DIAGNOSIS — Z23 Encounter for immunization: Secondary | ICD-10-CM | POA: Diagnosis not present

## 2023-07-10 ENCOUNTER — Ambulatory Visit: Payer: Medicare Other | Admitting: Speech Pathology

## 2023-07-10 DIAGNOSIS — R4701 Aphasia: Secondary | ICD-10-CM

## 2023-07-10 DIAGNOSIS — R41841 Cognitive communication deficit: Secondary | ICD-10-CM | POA: Diagnosis not present

## 2023-07-11 DIAGNOSIS — N3000 Acute cystitis without hematuria: Secondary | ICD-10-CM | POA: Diagnosis not present

## 2023-07-12 ENCOUNTER — Encounter: Payer: Self-pay | Admitting: Speech Pathology

## 2023-07-12 ENCOUNTER — Ambulatory Visit: Payer: Medicare Other | Admitting: Speech Pathology

## 2023-07-12 NOTE — Therapy (Signed)
OUTPATIENT SPEECH LANGUAGE PATHOLOGY TREATMENT NOTE & DISCHARGE SUMMARY   Patient Name: TARUN VIEGAS MRN: [id number] DOB:1941/07/31, 82 y.o., male Today's Date: 07/12/2023  PCP: Renford Dills, MD REFERRING PROVIDER: Renford Dills, MD  END OF SESSION:   End of Session - 07/12/23 1428     Visit Number 40    Number of Visits 77    Date for SLP Re-Evaluation 08/07/23    Authorization Type Medicare    SLP Start Time 1400    SLP Stop Time  1445    SLP Time Calculation (min) 45 min    Activity Tolerance Patient tolerated treatment well            Speech Therapy Progress Note  Dates of Reporting Period: 12/26/22 to 07/12/23  Objective Reports of Subjective Statement: See goals and impression   Goal Update: d/c ST   Plan: d/c ST   Past Medical History:  Diagnosis Date   AAA (abdominal aortic aneurysm) (HCC)    last u/s done 07/18/17    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) 05/22/2022   Arthritis    Bradycardia    CVA (cerebral vascular accident) (HCC) 10/04/2022   Dysrhythmia    frequent PAC, for 20 years   GERD (gastroesophageal reflux disease)    occ, OTC   Hemorrhoids    History of hiatal hernia    Hyperlipidemia    Hypertension    S/P aortic dissection repair 09/18/2022   Seasonal allergies    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) 10/26/2022   Past Surgical History:  Procedure Laterality Date   ABDOMINAL AORTIC ENDOVASCULAR STENT GRAFT N/A 05/04/2020   Procedure: ABDOMINAL AORTIC ENDOVASCULAR STENT GRAFT;  Surgeon: Larina Earthly, MD;  Location: [facility] OR;  Service: Vascular;  Laterality: N/A;   CATARACT EXTRACTION Bilateral 2009   DIAGNOSTIC LAPAROSCOPY     laparoscopic hernia repair   EYE SURGERY Bilateral    cataract removal   HERNIA REPAIR Bilateral 1999, 2006   JOINT REPLACEMENT Right ~2018   hip replacement   pheochromocytoma  1993   PROSTATECTOMY N/A 05/15/2013   Procedure: PROSTATECTOMY RETROPUBIC; SIMPLE OPEN PROSTATECTOMY;  Surgeon:  Valetta Fuller, MD;  Location: [facility] ORS;  Service: Urology;  Laterality: N/A;   REPAIR OF ACUTE ASCENDING THORACIC AORTIC DISSECTION N/A 09/17/2022   Procedure: REPAIR OF ACUTE ASCENDING THORACIC AORTIC DISSECTION USING 28 MM HEMASHIELD PLATINUM VASCULAR GRAFT;  Surgeon: Loreli Slot, MD;  Location: [facility] OR;  Service: Vascular;  Laterality: N/A;  Median sternotomy   TOTAL ELBOW REPLACEMENT Left    > 30 years ago   TOTAL HIP ARTHROPLASTY Right 09/08/2017   Procedure: RIGHT TOTAL HIP ARTHROPLASTY ANTERIOR APPROACH;  Surgeon: Kathryne Hitch, MD;  Location: [facility] ORS;  Service: Orthopedics;  Laterality: Right;   ULTRASOUND GUIDANCE FOR VASCULAR ACCESS Bilateral 05/04/2020   Procedure: ULTRASOUND GUIDANCE FOR VASCULAR ACCESS;  Surgeon: Larina Earthly, MD;  Location: [facility] OR;  Service: Vascular;  Laterality: Bilateral;   Patient Active Problem List   Diagnosis Date Noted   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) 10/26/2022   CVA (cerebral vascular accident) (HCC) 10/04/2022   Sepsis with acute renal failure without septic shock (HCC) 09/30/2022   S/P aortic dissection repair 09/18/2022   Status post surgery 09/17/2022   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) 05/22/2022   Colon cancer screening 05/22/2022   Essential hypertension 05/22/2022   Hemorrhoids 05/22/2022   Inguinal hernia 05/22/2022   Irritable bowel syndrome 05/22/2022  Mixed hyperlipidemia 05/22/2022   Pure hypercholesterolemia 05/22/2022   Thrombocytopenia (HCC) 05/22/2022   Allergic rhinitis 05/22/2022   AAA (abdominal aortic aneurysm) without rupture (HCC) 05/04/2020   Trochanteric bursitis, right hip 08/20/2018   History of right hip replacement 08/20/2018   Status post total replacement of right hip 09/08/2017   Pain of right hip joint 08/07/2017   Unilateral primary osteoarthritis, right hip 08/07/2017   BPH (benign prostatic hypertrophy) with urinary obstruction 05/15/2013    ONSET DATE:  09/17/22  REFERRING DIAG: R13.10 (ICD-10-CM) - Dysphagia, unspecified  THERAPY DIAG:  Aphasia  Cognitive communication deficit  Rationale for Evaluation and Treatment: Rehabilitation  SUBJECTIVE:  SUBJECTIVE STATEMENT: "Dr. Wynn Banker signed off" PAIN:  Are you having pain? No  OBJECTIVE:  Pt is a 81 y/o M admitted to [facility] on 09/17/22 with type 1 aortic dissection requiring emergent repair. Head CT 11/6 acute infarcts within R MCA involvement, including R cerebellum, basal ganglia, frontal lobe. ETT 11/4-11/9. PMH AAA s/p endovascular repair (2020), HTN, HLD, GERD.   TODAY'S TREATMENT:                                                                                                                                         DATE:   07/12/23: Garhett continues to successfully use his smart phone and continues to cook more complex meals. Reading comprehension, organization and processing targeted in deduction task with 90% accuracy and rare min A. In moderately complex scheduling task, Boaz required occasional min A to complete task with 90% accuracy.   8/15/24[name] cooks salmon and made salmon salad independently. Reading comprehension, mental money math, attention to details reading menu, adding prices and making change - Cindy required occasional min A for organization and to ID 1 error, otherwise 4/5 correct. Organization, reasoning and attention to detail targeted in mildly complex deduction puzzle, Wilhelm required usual min to mod A (verbal cues, pointing cues)  to complete task with 90% accuracy.   06/28/23: Targeted reasoning, problem solving, processing and attention to details in moderately complex deduction task. Lamontre requires extended time frequently, usual mod verbal cues and min written/visual cues for attention and processing. Attention to detail and mental time math and reading comprehension targeted reading pool schedule with details and answering time questions. Aniello required  occasional mod verbal cues and visual cues to attend to details, usual extended time for time math and verbal cues to ID math errors.   06/26/23: Mart returns with a video of his home he took on his Iphone. He successfully opened the photo app and played the video with mod I, 2 re-tries. Targeted reading comprehension, mental math and organization in task with moderately complex instructions with simple mental math - French required occasional written cues, consistent extended time and frequent verbal cues and re-reading. He benefits from cues to follow 1 step in a complex direction at a time. He requires extended  time for processing and visual supports for working memory. Iktan will use strategies for attention, organization to cook more complex meal with main dish and side(s) with supervision from spouse.  8/7/24[name] successfully took a variety of photos on his phone at various times. He pulled up the  photos to show me independently. He continues to report improvement on using his smart phone apps. Targeted reading comprehension, organization, reasoning, processing  organizing a schedule and trip with mildly complex written instructions and some deduction required. Tavier requires consistent extended time and frequent mod verbal cues and written cues to complete tasks. Reading comprehension continues to be slow.  06/19/23: Rocci continues to access music on his phone with rare min A and is accessing music at home with success. This week will target camera and photos on his smart phone with occasional min A. Targeted reading comprehension, organization and reasoning with frequent mod to max A and extended time on mildly complex deduction task. Targeted reasoning, problem solving, working memory and attention to details reading moderately complex golf course fees/schedule and money math - Joshau required frequent visual cues of fees required and use of bookmark to read the chart correctly. Extended time  and usual min to mod A for mental money math  06/12/23: [name] completed deduction HW his spouse requested. Targeted attention to detail, mental math and organization completing simple multiplication chart with 10's. 100's and 1000's places. Avante frequently required mod verbal, visual cues to complete mental math with correct zeros. Extended time required consistently. Targeted reasoning and organization in mildly complex math reasoning task which required usual min to mod visual/verbal cues. Auditory comprehension required extended time and occasional repetition. Generate strategies to compensate for attention, processing to successfully cook simple meals.   06/07/23: Reading comprehension, organization, attention to detail targeted using deduction puzzles, patient required consistent max-A of pointing gestures, extended wait time, repetition of complex directions. SLP provided semantic cues, and filling in part of the answer in order to facilitate correct answers. Discussion led to find additional functional needs at home and in day to day life. Education provided on remembering names of doctors. Patient states that his goal is to type on the computer and drive a golf cart. Stated apprehension on getting back to golf, and provided education potential PT evaluation in the future.   06/05/23: Targeted reasoning, processing and following written directions in deduction chart - he required consistent mod to max A to follow simple directions with consistent extended time for processing. [place] benefits from covering parts of chart to improve processing speed and attention. In mildly complex card sort Gurkirat required extended time for processing consistently and frequent cues to recall rules and follow rules as well as use reasoning and strategy.      PATIENT EDUCATION: Education details: See patient instructions, see today's treatment, compensations for aphasia Person educated: Patient and Spouse Education  method: Explanation, Verbal cues, and Handouts Education comprehension: verbal cues required and needs further education  GOALS: Goals reviewed with patient? Yes   SHORT TERM GOALS: Target date: 01/23/23   Pt will name 10 items in personally relevant categories over 3 sessions with occasional min A Baseline: Goal status: MET   2.  Pt will write 4 item grocery list with occasional min A Baseline:  Goal status: MET   3.  Pt will respond to 3 texts with occasional min A over 1 week Baseline:  Goal status: MET   4.  Pt will generate complex verbal sentences 3x using  Scientist, product/process development with rare min A over 2 sessions Baseline:  Goal status: MET   5.  Pt will comprehend 4 sentence passage/email with extended time and rare min A Baseline:  Goal status: MET   6.  Pt will employ compensations for aphasia in structured language tasks with occasional min A Baseline:  Goal status: MET   LONG TERM GOALS: Target date: 08/07/23   Pt will complete complex naming tasks with 80% accuracy and occasional min A Baseline:  Goal status: MET   2.  Pt will carryover verbal compensations for aphasia with occasional min A in conversation as needed over 2 sessions Baseline:  Goal status: MET   3.  Pt will carryover 3 strategies to successfully communicate in community with clerks, servers etc Baseline:  Goal status: MET   4.  Pt will write 3 sentence text or email with occasional min A, external aids and occasional min A  Baseline:  Goal status: MET   5.  Pt will improve score on Communicative Participation Item Bank by 4 points Baseline: 14/30 Goal status: MET  6. Pt will comprehend 3-5 paragraph story or article by accurately summarizing salient details in cohesive summary with rare min A             Goal Status: PARTIALLY MET  7. Pt will generate 5-7 sentence paragraph or email, Iding and correcting errors with occasional min A              Goal Status: MET  8. Pt  will generate detailed, cohesive summary of a movie, news, article show or story requiring 3 or less requests for clarification               Goal Status: Ongoing  9. Pt will carryover 2 compensations to make 3 phone calls to friends  or businesses with rare min A               Goal Status: MET  10. Pt will navigate music, photos and note section on his phone with occasional min A                Goal Status: PARTIALLY MET, note section deferred due to complex organization system  11. Pt will follow moderately complex written instructions/directions with occasional min A 8/10                 Goal Status: PARTIALLY MET  12. Pt will complex moderately complex organization, reasoning and deduction tasks with 80% accuracy and occasional min A                    Goal Status: MET  13. Dewey will cook moderately complex meal (main dish and side) using compensations for attention, processing and organization with rare min A                    Goal Status: MET                             ASSESSMENT:   CLINICAL IMPRESSION:  Maxximus  continues to make excellent progress and reports actively completing his HEP at home. Verbal expression for moderately complex conversation is with mod I .Mild aphasia and mild cognitive impairments persists for complex IADL's, however Tommye is managing ADL's and more simple IADL's with rare min A. He has returned to cooking, using his smart phone, planning for future events, and communicating  successfully across home and community settings.   Goals have been met, d/c ST at this time, pt and wife are in agreement.  OBJECTIVE IMPAIRMENTS: include memory and aphasia. These impairments are limiting patient from managing medications, managing appointments, managing finances, household responsibilities, ADLs/IADLs, and effectively communicating at home and in community. Factors affecting potential to achieve goals and functional outcome are ability to learn/carryover  information. Patient will benefit from skilled SLP services to address above impairments and improve overall function.   REHAB POTENTIAL: Good   PLAN:   SLP FREQUENCY: 2x/week   SLP DURATION: 12 weeks   PLANNED INTERVENTIONS: Language facilitation, Environmental controls, Cueing hierachy, Cognitive reorganization, Internal/external aids, Functional tasks, and Multimodal communication approach     Divya Munshi, Radene Journey, CCC-SLP 07/12/2023, 2:37 PM

## 2023-07-25 ENCOUNTER — Other Ambulatory Visit: Payer: Self-pay | Admitting: Thoracic Surgery (Cardiothoracic Vascular Surgery)

## 2023-07-25 DIAGNOSIS — Z9889 Other specified postprocedural states: Secondary | ICD-10-CM

## 2023-07-28 DIAGNOSIS — Z8673 Personal history of transient ischemic attack (TIA), and cerebral infarction without residual deficits: Secondary | ICD-10-CM | POA: Diagnosis not present

## 2023-07-28 DIAGNOSIS — H6191 Disorder of right external ear, unspecified: Secondary | ICD-10-CM | POA: Diagnosis not present

## 2023-08-03 ENCOUNTER — Telehealth (HOSPITAL_COMMUNITY): Payer: Self-pay | Admitting: Cardiology

## 2023-08-03 NOTE — Telephone Encounter (Signed)
Pt seen by PCP for  bleeding lesion on earlobe on 9/13, was advised to hold ASA x 3 days and follow up with cards to potentially discontinue.   Pt would like to stop ASA all together. Currently taking 81 mg daily-prophylactic AAA: Status post aneurysm repair by Dr. Arbie Cookey on 05/04/2020   Please advise

## 2023-08-03 NOTE — Telephone Encounter (Signed)
Patient aware.

## 2023-08-10 ENCOUNTER — Encounter: Payer: Self-pay | Admitting: Thoracic Surgery (Cardiothoracic Vascular Surgery)

## 2023-08-18 ENCOUNTER — Ambulatory Visit
Admission: RE | Admit: 2023-08-18 | Discharge: 2023-08-18 | Disposition: A | Discharge status: Home / Self Care | Payer: Medicare Other | Source: Ambulatory Visit | Attending: Thoracic Surgery (Cardiothoracic Vascular Surgery) | Admitting: Thoracic Surgery (Cardiothoracic Vascular Surgery)

## 2023-08-18 DIAGNOSIS — Z9889 Other specified postprocedural states: Secondary | ICD-10-CM

## 2023-08-18 DIAGNOSIS — R399 Unspecified symptoms and signs involving the genitourinary system: Secondary | ICD-10-CM | POA: Diagnosis not present

## 2023-08-18 DIAGNOSIS — I7121 Aneurysm of the ascending aorta, without rupture: Secondary | ICD-10-CM | POA: Diagnosis not present

## 2023-08-18 DIAGNOSIS — I509 Heart failure, unspecified: Secondary | ICD-10-CM | POA: Diagnosis not present

## 2023-08-18 DIAGNOSIS — Z95828 Presence of other vascular implants and grafts: Secondary | ICD-10-CM | POA: Diagnosis not present

## 2023-08-18 MED ORDER — IOPAMIDOL (ISOVUE-370) INJECTION 76%
500.0000 mL | Freq: Once | INTRAVENOUS | Status: AC | PRN
  Administered 2023-08-18: 75 mL via INTRAVENOUS

## 2023-08-22 ENCOUNTER — Ambulatory Visit: Payer: Medicare Other | Admitting: Thoracic Surgery (Cardiothoracic Vascular Surgery)

## 2023-08-22 ENCOUNTER — Encounter: Payer: Self-pay | Admitting: Thoracic Surgery (Cardiothoracic Vascular Surgery)

## 2023-08-22 VITALS — BP 136/88 | HR 56 | Resp 20 | Ht 72.0 in | Wt 181.0 lb

## 2023-08-22 DIAGNOSIS — Z9889 Other specified postprocedural states: Secondary | ICD-10-CM | POA: Diagnosis not present

## 2023-08-22 NOTE — Progress Notes (Signed)
[address]       James Estrada [address]             [phone]     HPI: James Estrada returns for a scheduled follow-up visit after repair of a type I aortic dissection  James Estrada is a 82 year old man with a history of hypertension, hyperlipidemia, bradycardia, PACs, abdominal aortic aneurysm, endovascular repair, type I aortic dissection, and multiple strokes.  Presented in November 2023 with chest pain, syncope, and bilateral leg weakness.  Underwent repair.  A type I aortic dissection.  He had a stroke with left hemiparesis and aphasia and also had atrial fibrillation.  Went to rehab after about 2-1/2 weeks and then finally discharged from there in early December.  I last saw him in the office in February.  He was doing quite well at that time all things considered.  He feels well.  He is playing golf about once a week.  Walks 20 minutes a day.  Still has some concerns with balance and memory issues.  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 30 tablet 0   atorvastatin (LIPITOR) 40 MG tablet Take 1 tablet (40 mg total) by mouth daily. 30 tablet 0   doxazosin (CARDURA) 1 MG tablet Take 1 tablet (1 mg total) by mouth at bedtime. 30 tablet 0   metoprolol tartrate (LOPRESSOR) 25 MG tablet Take 0.5 tablets (12.5 mg total) by  mouth 2 (two) times daily. 15 tablet 0   mirtazapine (REMERON) 15 MG tablet Take 10 mg by mouth at bedtime.     tamsulosin (FLOMAX) 0.4 MG CAPS capsule Take 0.4 mg by mouth daily.     No current facility-administered medications for this visit.    Physical Exam BP 136/88 (BP Location: Right Arm, Patient Position: Sitting)   Pulse (!) 56   Resp 20   Ht 6' (1.829 m)   Wt 181 lb (82.1 kg)   SpO2 99% Comment: RA  BMI 24.55 kg/m  Well-appearing 82 year old man in no acute distress Alert and oriented x 3 .  Good strength but slight hesitancy with gait.  Slight hesitancy with speech. Palpable 2+ carotid pulses with faint right bruit Lungs clear with equal breath sounds bilaterally Cardiac bradycardic with occasional ectopy.  Faint systolic murmur No peripheral edema  Diagnostic Tests: CT ANGIOGRAPHY CHEST, ABDOMEN AND PELVIS   TECHNIQUE: Non-contrast CT of the chest was initially obtained.   Multidetector CT imaging through the chest, abdomen and pelvis was performed using the standard protocol during bolus administration of intravenous contrast. Multiplanar reconstructed images and MIPs were obtained and reviewed to evaluate the vascular anatomy.   RADIATION DOSE REDUCTION: This exam was performed according to the departmental dose-optimization program  which includes automated exposure control, adjustment of the mA and/or kV according to patient size and/or use of iterative reconstruction technique.   CONTRAST:  75mL ISOVUE-370 IOPAMIDOL (ISOVUE-370) INJECTION 76%   COMPARISON:  Prior CTA on 01/05/2023   FINDINGS: CTA CHEST FINDINGS   Cardiovascular: Stable appearance status post repair of ascending thoracic aorta with normally patent ascending aortic graft demonstrating stable appearance and normal caliber. No evidence of aneurysmal disease or pseudoaneurysm. Stable appearance of residual chronic dissection extending into the innominate artery and proximal aspects of the  right subclavian and right common carotid artery without evidence thrombus or luminal restriction. The visualized left common carotid and left subclavian arteries are normally patent. The rest of the native thoracic aorta is normal in caliber and demonstrates no dissection or significant atherosclerosis.   The heart size is normal. Stable calcified coronary artery plaque. No pericardial fluid identified. Central pulmonary arteries are normal in caliber.   Mediastinum/Nodes: No enlarged mediastinal, hilar, or axillary lymph nodes. Thyroid gland, trachea, and esophagus demonstrate no significant findings. Stable small hiatal hernia.   Lungs/Pleura: There is no evidence of pulmonary edema, consolidation, pneumothorax or pleural fluid. Stable benign-appearing 5 mm subpleural nodule at the lateral right lung base. Stable bibasilar pulmonary scarring.   Musculoskeletal: No chest wall abnormality. Healed median sternotomy. No acute or significant osseous findings.   Review of the MIP images confirms the above findings.   CTA ABDOMEN AND PELVIS FINDINGS   VASCULAR   Aorta: Stable and normal patency aortic endograft with limbs extending into dilated bilateral common iliac arteries. Tortuous aneurysm sac demonstrates slightly decreased caliber with maximum perpendicular diameter of approximately 3.3 cm compared to 3.5 cm previously. Based on current appearance of the aneurysm sac and density measurements, a convincing endoleak is no longer identified.   Celiac: Normally patent. Normally patent branch vessels and branching anatomy.   SMA: Normally patent.   Renals: Normally patent bilateral single renal arteries.   IMA: Chronically occluded with no significant retrograde opacification of the proximal IMA trunk. Distal supply is reconstituted by SMA collaterals.   Inflow: Stable dilated left common iliac artery distal to the endograft limb measures up to 2.7 cm. Stable aneurysmal  disease of the left internal iliac artery measures up to 3.5 cm. Stable aneurysmal disease just distal to the right common iliac artery limb measures up to 2.5 cm. Stable aneurysmal disease of the right internal iliac artery measures up to 2.9 cm. Normally patent bilateral common femoral arteries and femoral bifurcations.   Veins: Delayed imaging demonstrates normal patency venous structures in the abdomen and pelvis.   Review of the MIP images confirms the above findings.   NON-VASCULAR   Hepatobiliary: Stable scattered cysts in the liver. The gallbladder is unremarkable. No biliary ductal dilatation.   Pancreas: Unremarkable. No pancreatic ductal dilatation or surrounding inflammatory changes.   Spleen: Normal in size without focal abnormality.   Adrenals/Urinary Tract: Stable and unremarkable appearance of the adrenal glands and kidneys bilaterally. No renal masses or calculi. No hydronephrosis. Stable circumferential bladder wall thickening is suggestive of chronic outlet obstruction. Stable diverticular outpouching along the posterior left wall of the bladder.   Stomach/Bowel: Bowel shows no evidence of obstruction, ileus, inflammation or lesion. The appendix is not discretely visualized. No free intraperitoneal air.   Lymphatic: No enlarged lymph nodes identified in the abdomen or pelvis.   Reproductive: Mild prostatic enlargement.   Other: Stable bilateral inguinal hernias containing fat.   Musculoskeletal: Stable degenerative disc disease of the  lumbar spine. No fractures or bony lesions identified.   Review of the MIP images confirms the above findings.   IMPRESSION: 1. Stable appearance status post repair of ascending thoracic aortic dissection with normally patent ascending aortic graft. Stable appearance of residual chronic dissection extending into the innominate artery and proximal aspects of the right subclavian and right common carotid arteries without  evidence of thrombus or luminal restriction. 2. Stable and normal patency of aortic endograft with limbs extending into dilated bilateral common iliac arteries. Tortuous aneurysm sac demonstrates slightly decreased caliber with maximum perpendicular diameter of approximately 3.3 cm compared to 3.5 cm previously. Based on current appearance of the aneurysm sac and density measurements, a convincing endoleak is no longer identified. 3. Stable aneurysmal disease of bilateral internal iliac arteries and common iliac arteries. 4. Stable circumferential bladder wall thickening suggestive of chronic outlet obstruction. Stable diverticular outpouching along the posterior left wall of the bladder. 5. Stable small hiatal hernia. 6. Stable calcified coronary artery plaque. 7. Stable benign-appearing 5 mm subpleural nodule at the lateral right lung base.     Electronically Signed   By: Irish Lack M.D.   On: 08/18/2023 13:00   I personally reviewed the CT images.  Status post repair of type I aortic dissection.  No pseudoaneurysm.  Dissection flap noted in innominate.  Status post abdominal endograft.  Stable iliac aneurysms.  Impression: James Estrada is a 82 year old man with a history of hypertension, hyperlipidemia, bradycardia, PACs, abdominal aortic aneurysm, endovascular repair, type I aortic dissection, and multiple strokes.  Type I aortic dissection-status post hemiarch repair.  Complicated by embolic strokes initially.  Repair is stable.  Descending thoracic aorta is unremarkable.  Dissection flap noted and innominate extending into the right common and subclavian.  Stable.  Abdominal and iliac aneurysms-status post EVAR by Dr. Arbie Cookey.  Has persistent iliac aneurysms which are stable.  Saw Dr. Lenell Antu about a year ago, will help arrange a follow-up visit with him.  Hypertension-blood pressure well-controlled.  Plan: Follow-up with Dr. Lenell Antu of [facility] Return in 1 year with CT angio  chest abdomen pelvis  Loreli Slot, MD [facility] [phone]

## 2023-09-07 DIAGNOSIS — K080 Exfoliation of teeth due to systemic causes: Secondary | ICD-10-CM | POA: Diagnosis not present

## 2023-09-25 NOTE — Progress Notes (Unsigned)
[facility] [place]  ASSESSMENT / PLAN: James Estrada is a 82 y.o. male status post EVAR for rapidly enlarging infrarenal abdominal aortic aneurysm 05/04/20. Patient has known left internal iliac artery aneurysm which will need to be observed going forward. Check a CTA abdomen / pelvis in one year. No evidence of popliteal artery aneurysm.  CHIEF COMPLAINT: EVAR surveillance  HISTORY OF PRESENT ILLNESS: James Estrada is a 82 y.o. male status post EVAR 05/04/20 with Dr. Arbie Cookey.  He did very well from this.  He has had no trouble since surgery.  No abdominal complaints.  We reviewed his most recent surveillance duplex today.  We had a 10-minute conversation about the natural history of aneurysm disease and the rationale for surveillance.  07/26/22: returns for surveillance. We reviewed his scans. No new symptoms or concerns.   VASCULAR SURGICAL HISTORY: EVAR 05/04/2020  VASCULAR RISK FACTORS: Negative history of cerebrovascular disease / stroke / transient ischemic attack. Negative history of coronary artery disease.  Negative history of diabetes mellitus.  Negative history of smoking.  Positive history of hypertension.  Negative history of chronic kidney disease. Negative history of chronic obstructive pulmonary disease.  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) 05/22/2022   Arthritis    Bradycardia    CVA (cerebral vascular accident) (HCC) 10/04/2022   Dysrhythmia    frequent PAC, for 20 years   GERD (gastroesophageal reflux disease)    occ, OTC   Hemorrhoids    History of hiatal hernia    Hyperlipidemia    Hypertension    S/P aortic dissection repair 09/18/2022   Seasonal allergies    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) 10/26/2022    Past Surgical History:   Procedure Laterality Date   ABDOMINAL AORTIC ENDOVASCULAR STENT GRAFT N/A 05/04/2020   Procedure: ABDOMINAL AORTIC ENDOVASCULAR STENT GRAFT;  Surgeon: Larina Earthly, MD;  Location: [facility] OR;  Service: Vascular;  Laterality: N/A;   CATARACT EXTRACTION Bilateral 2009   DIAGNOSTIC LAPAROSCOPY     laparoscopic hernia repair   EYE SURGERY Bilateral    cataract removal   HERNIA REPAIR Bilateral 1999, 2006   JOINT REPLACEMENT Right ~2018   hip replacement   pheochromocytoma  1993   PROSTATECTOMY N/A 05/15/2013   Procedure: PROSTATECTOMY RETROPUBIC; SIMPLE OPEN PROSTATECTOMY;  Surgeon: Valetta Fuller, MD;  Location: [facility] ORS;  Service: Urology;  Laterality: N/A;   REPAIR OF ACUTE ASCENDING THORACIC AORTIC DISSECTION N/A 09/17/2022   Procedure: REPAIR OF ACUTE ASCENDING THORACIC AORTIC DISSECTION USING 28 MM HEMASHIELD PLATINUM VASCULAR GRAFT;  Surgeon: Loreli Slot, MD;  Location: [facility] OR;  Service: Vascular;  Laterality: N/A;  Median sternotomy   TOTAL ELBOW REPLACEMENT Left    > 30 years ago   TOTAL HIP ARTHROPLASTY Right 09/08/2017   Procedure: RIGHT TOTAL HIP ARTHROPLASTY ANTERIOR APPROACH;  Surgeon: Kathryne Hitch, MD;  Location: [facility] ORS;  Service: Orthopedics;  Laterality: Right;   ULTRASOUND GUIDANCE FOR VASCULAR ACCESS Bilateral 05/04/2020   Procedure: ULTRASOUND GUIDANCE FOR VASCULAR ACCESS;  Surgeon: Larina Earthly, MD;  Location: [facility] OR;  Service: Vascular;  Laterality: Bilateral;    Family History  Problem Relation Age of Onset   Heart disease Mother    Coronary artery disease Mother    Aneurysm Father  Diabetes Sister     Social History   Socioeconomic History   Marital status: Married    Spouse name: Not on file   Number of children: Not on file   Years of education: Not on file   Highest education level: Not on file  Occupational History   Not on file  Tobacco Use   Smoking status: Never   Smokeless tobacco: Never  Vaping Use   Vaping status: Never Used   Substance and Sexual Activity   Alcohol use: Yes    Comment: 2 or 3 drinks on weekends   Drug use: No   Sexual activity: Not on file  Other Topics Concern   Not on file  Social History Narrative   Not on file   Social Determinants of Health   Financial Resource Strain: Not on file  Food Insecurity: No Food Insecurity (09/22/2022)   Hunger Vital Sign    Worried About Running Out of Food in the Last Year: Never true    Ran Out of Food in the Last Year: Never true  Transportation Needs: No Transportation Needs (09/22/2022)   PRAPARE - Administrator, Civil Service (Medical): No    Lack of Transportation (Non-Medical): No  Physical Activity: Not on file  Stress: Not on file  Social Connections: Not on file  Intimate Partner Violence: Not At Risk (09/22/2022)   Humiliation, Afraid, Rape, and Kick questionnaire    Fear of Current or Ex-Partner: No    Emotionally Abused: No    Physically Abused: No    Sexually Abused: No    No Known Allergies  Current Outpatient Medications  Medication Sig Dispense Refill   aspirin 81 MG chewable tablet Chew 1 tablet (81 mg total) by mouth daily. 30 tablet 0   atorvastatin (LIPITOR) 40 MG tablet Take 1 tablet (40 mg total) by mouth daily. 30 tablet 0   doxazosin (CARDURA) 1 MG tablet Take 1 tablet (1 mg total) by mouth at bedtime. 30 tablet 0   metoprolol tartrate (LOPRESSOR) 25 MG tablet Take 0.5 tablets (12.5 mg total) by mouth 2 (two) times daily. 15 tablet 0   mirtazapine (REMERON) 15 MG tablet Take 10 mg by mouth at bedtime.     tamsulosin (FLOMAX) 0.4 MG CAPS capsule Take 0.4 mg by mouth daily.     No current facility-administered medications for this visit.    REVIEW OF SYSTEMS:  [X]  denotes positive finding, [ ]  denotes negative finding Cardiac  Comments:  Chest pain or chest pressure:    Shortness of breath upon exertion:    Short of breath when lying flat:    Irregular heart rhythm:        Vascular    Pain in calf,  thigh, or hip brought on by ambulation:    Pain in feet at night that wakes you up from your sleep:     Blood clot in your veins:    Leg swelling:         Pulmonary    Oxygen at home:    Productive cough:     Wheezing:         Neurologic    Sudden weakness in arms or legs:     Sudden numbness in arms or legs:     Sudden onset of difficulty speaking or slurred speech:    Temporary loss of vision in one eye:     Problems with dizziness:         Gastrointestinal  Blood in stool:     Vomited blood:         Genitourinary    Burning when urinating:     Blood in urine:        Psychiatric    Major depression:         Hematologic    Bleeding problems:    Problems with blood clotting too easily:        Skin    Rashes or ulcers:        Constitutional    Fever or chills:      PHYSICAL EXAM There were no vitals filed for this visit.   Constitutional: well appearing. no distress. Appears well nourished.  Neurologic: CN intact. no focal findings. no sensory loss. Psychiatric:  Mood and affect symmetric and appropriate. Eyes:  No icterus. No conjunctival pallor. Ears, nose, throat:  mucous membranes moist. Midline trachea.  Cardiac: regular rate and rhythm.  Respiratory:  unlabored. Abdominal:  soft, non-tender, non-distended.  Peripheral vascular: 2+ radial pulses; 2+ popliteal pulses; 2+ DP pulses Extremity: no edema. no cyanosis. no pallor.  Skin: no gangrene. no ulceration.  Lymphatic: no Stemmer's sign. no palpable lymphadenopathy.  PERTINENT LABORATORY AND RADIOLOGIC DATA  Most recent CBC    Latest Ref Rng & Units 12/28/2022    2:54 PM 10/30/2022    7:25 PM 10/17/2022    7:59 AM  CBC  WBC 4.0 - 10.5 K/uL 9.7  12.9  9.0   Hemoglobin 13.0 - 17.0 g/dL 40.1  02.7  25.3   Hematocrit 39.0 - 52.0 % 38.9  34.6  31.8   Platelets 150 - 400 K/uL 185  174  140      Most recent CMP    Latest Ref Rng & Units 12/28/2022    2:54 PM 10/30/2022    7:25 PM 10/17/2022     7:59 AM  CMP  Glucose 70 - 99 mg/dL 664  403  474   BUN 8 - 23 mg/dL 24  18  15    Creatinine 0.61 - 1.24 mg/dL 2.59  5.63  8.75   Sodium 135 - 145 mmol/L 139  138  138   Potassium 3.5 - 5.1 mmol/L 4.4  4.2  3.9   Chloride 98 - 111 mmol/L 105  101  106   CO2 22 - 32 mmol/L 25  29  24    Calcium 8.9 - 10.3 mg/dL 9.2  8.8  8.4     Renal function CrCl cannot be calculated (Patient's most recent lab result is older than the maximum 21 days allowed.).  Hgb A1c MFr Bld (%)  Date Value  09/19/2022 5.5    LDL Chol Calc (NIH)  Date Value Ref Range Status  05/28/2020 87 0 - 99 mg/dL Final   LDL Cholesterol  Date Value Ref Range Status  09/19/2022 42 0 - 99 mg/dL Final    Comment:           Total Cholesterol/HDL:CHD Risk Coronary Heart Disease Risk Table                     Men   Women  1/2 Average Risk   3.4   3.3  Average Risk       5.0   4.4  2 X Average Risk   9.6   7.1  3 X Average Risk  23.4   11.0        Use the calculated Patient Ratio above and the CHD Risk  Table to determine the patient's CHD Risk.        ATP III CLASSIFICATION (LDL):  <100     mg/dL   Optimal  782-956  mg/dL   Near or Above                    Optimal  130-159  mg/dL   Borderline  213-086  mg/dL   High  >578     mg/dL   Very High Performed at [facility], [address] [place], [place] [address]      Vascular Imaging: CT angiogram reviewed in detail. Good seal in EVAR. Sac 3.7cm RIIA 2.5cm LIIA 3.4cm  Rande Brunt. Lenell Antu, MD [facility] Phone Number: [phone] 09/25/2023 9:27 AM

## 2023-09-26 ENCOUNTER — Ambulatory Visit: Payer: Medicare Other | Admitting: Vascular Surgery

## 2023-09-26 ENCOUNTER — Encounter: Payer: Self-pay | Admitting: Vascular Surgery

## 2023-09-26 VITALS — BP 122/68 | HR 68 | Temp 98.5°F | Resp 20 | Ht 72.0 in | Wt 179.0 lb

## 2023-09-26 DIAGNOSIS — I714 Abdominal aortic aneurysm, without rupture, unspecified: Secondary | ICD-10-CM | POA: Diagnosis not present

## 2023-09-26 DIAGNOSIS — I723 Aneurysm of iliac artery: Secondary | ICD-10-CM | POA: Diagnosis not present

## 2023-09-27 DIAGNOSIS — K080 Exfoliation of teeth due to systemic causes: Secondary | ICD-10-CM | POA: Diagnosis not present

## 2023-10-05 DIAGNOSIS — R339 Retention of urine, unspecified: Secondary | ICD-10-CM | POA: Diagnosis not present

## 2023-10-05 DIAGNOSIS — N401 Enlarged prostate with lower urinary tract symptoms: Secondary | ICD-10-CM | POA: Diagnosis not present

## 2023-10-06 ENCOUNTER — Other Ambulatory Visit: Payer: Self-pay

## 2023-10-06 DIAGNOSIS — I723 Aneurysm of iliac artery: Secondary | ICD-10-CM

## 2023-10-23 ENCOUNTER — Encounter: Payer: Self-pay | Admitting: Neurology

## 2023-10-23 ENCOUNTER — Ambulatory Visit: Payer: Medicare Other | Admitting: Neurology

## 2023-10-23 VITALS — BP 114/63 | HR 55 | Ht 72.0 in | Wt 184.4 lb

## 2023-10-23 DIAGNOSIS — R413 Other amnesia: Secondary | ICD-10-CM

## 2023-10-23 DIAGNOSIS — G3184 Mild cognitive impairment, so stated: Secondary | ICD-10-CM

## 2023-10-23 MED ORDER — CEREFOLIN 6-1-50-5 MG PO TABS: 1.0000 | ORAL_TABLET | Freq: Every day | ORAL | 3 refills | Status: DC

## 2023-10-23 NOTE — Patient Instructions (Signed)
I had a long d/w patient and wife  about his recent embolic strokes, mild cognitive impairment, risk for recurrent stroke/TIAs, personally independently reviewed imaging studies and stroke evaluation results and answered questions.Continue Aspirin  for secondary stroke prevention and maintain strict control of hypertension with blood pressure goal below 130/90, diabetes with hemoglobin A1c goal below 6.5% and lipids with LDL cholesterol goal below 70 mg/dL. I also advised the patient to eat a healthy diet with plenty of whole grains, cereals, fruits and vegetables, exercise regularly and maintain ideal body weight .I recommend he use his cane at all times and we discussed fall prevention precautions.  I also encouraged him to increase participation in cognitively challenging activities like solving crossword puzzles, playing bridge and sudoku.  We also discussed memory compensation strategies. .  I also recommend he is start taking Cerefolin NAC 1 tablet daily to help with cognitive improvement.  Followup in the future with me in 6 months or call earlier if necessary.

## 2023-10-23 NOTE — Progress Notes (Signed)
[facility] [address] [place]. [place] [address] [phone]       OFFICE FOLLOW-UP NOTE  [name] Date of Birth:  [birth year] Medical Record Number:  [id number]   HPI: Initial visit 11/22/2022 :Mr.James Estrada is a pleasant 82 year old Caucasian male seen today for initial office follow-up visit following hospital consultation for stroke in November 2023.  He is accompanied by his wife and daughter and history is obtained from them and review of electronic medical records.  I have also personally reviewed pertinent available imaging films in PACS. James Estrada is a 82 y.o. male with a past medical history AAA s/p EVAR with a known internal iliac artery aneurysm, arthritis, bradycardia, dysrhythmia, GERD, hemorrhoids, HLD, and HTN presenting from home via EMS for acute onset of severe BLE weakness. He was ambulating and had sudden onset of the BLE weakness resulting in a fall. Triage RN exam did an NIHSS and only deficits were leg weakness, left worse than right. Around 1550 he was outside talking to neighbors when he felt acutely weak in his BLE and lightheaded, in conjunction with acute onset of mild left neck pain and some left flank pain. He fell due to his legs collapsing under him and had to drag himself on the ground to move when he tried to get to the phone. He did make it up the steps and was able to get to the phone in his bedroom to call EMS. He was brought in to the ED where a Code Stroke was called. Since his arrival to the ED and while in CT his left flank pain has steadily worsened and his blood pressures have steadily dropped.  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  He also had evidence of retroperitoneal bleed.  He underwent emergent repair of type I aortic dissection by Dr. Dorris Fetch.  He was able to move all extremities and was following simple commands by the morning  of the first postoperative day however unfortunately later he was not able to follow commands equally on the right and the left side of his upper extremities.  Neurology was consulted.  MRI scan of the brain was obtained which showed numerous bilateral acute infarcts right greater than left involving MCA and PCA territories as well as cerebellum.  He was found to have transient atrial fibrillation which was treated with amiodarone drip and he converted to normal sinus rhythm soon.  EEG showed no seizure activity.  CT angiogram showed partially visualized aortic dissection into the right brachiocephalic and also into the origin of the right common carotid artery.  CT angiogram of the brain showed mild intracranial atherosclerotic changes.  Echocardiogram showed ejection fraction of 60 to 65%.  LDL cholesterol was 42 mg percent.  Hemoglobin A1c was 5.5.  Patient on examination he has significant left hemiplegia.  He was transferred to inpatient rehab and showed gradual improvement and discharged home.  He is doing well and feels he is made almost a full recovery.  He still has some diminished fine motor skills in the left side.  He is ambulating with a cane and occasionally feels off balance. ' His balance is still not good and he stumbles and fell once a few days ago  l.  His main deficit to be cognitive slowing.  He has trouble finding words and has to think about answers.  Short-term memory is also poor.  Physical and  Occupational Therapy still getting some therapy for cognition. Update 03/27/2023 : He returns for follow-up after last visit 4 months ago.  He is accompanied by his wife.  He continues to have mild memory and balance difficulties but these are unchanged.  Patient has not been participating in any mentally challenging activities on a regular basis.  He needs some help with his medications.  But otherwise is pretty independent.  He is currently participating in outpatient speech therapy which seems to  be helping him.  He remains on aspirin tolerating well without bruising or bleeding.  Blood pressure is under good control.  No recurrent stroke or TIA symptoms lab work at last visit on 1/9/20204 when Vitamin B12, TSH, homocystine and RPR were all normal.  Done on 11/24/2022 was also normal without any epileptiform activity.  He has no new complaints today. Update 10/23/2023 : He returns for follow-up after last visit 6 months ago.  He is accompanied by his wife.  He continues to downplay his memory difficulties with his wife feels he is doing slightly better.  He has not been quite active and participation in mentally challenging activities on regular basis.  He continues to have mild gait and balance difficulties but he is learned to walk slowly and has not had any falls or injuries.  He keeps himself physically active but has not been able to do new mentally challenging jobs.  He has no new complaints.  He did have follow-up CT scan of the chest abdomen pelvis done on 08/18/2023 which showed stable appearance of the ascending thoracic aortic repair of dissection with normally patent ascending aortic graft.  He did follow-up with Dr. Lenell Antu vascular surgeon recently  as well. 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) 05/22/2022   Arthritis    Bradycardia    CVA (cerebral vascular accident) (HCC) 10/04/2022   Dysrhythmia    frequent PAC, for 20 years   GERD (gastroesophageal reflux disease)    occ, OTC   Hemorrhoids    History of hiatal hernia    Hyperlipidemia    Hypertension    S/P aortic dissection repair 09/18/2022   Seasonal allergies    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) 10/26/2022    Social History:  Social History    Socioeconomic History   Marital status: Married    Spouse name: Not on file   Number of children: Not on file   Years of education: Not on file   Highest education level: Not on file  Occupational History   Not on file  Tobacco Use   Smoking status: Never   Smokeless tobacco: Never  Vaping Use   Vaping status: Never Used  Substance and Sexual Activity   Alcohol use: Yes    Comment: 2 or 3 drinks on weekends   Drug use: No   Sexual activity: Not on file  Other Topics Concern   Not on file  Social History Narrative   Not on file   Social Determinants of Health   Financial Resource Strain: Not on file  Food Insecurity: No Food Insecurity (09/22/2022)   Hunger Vital Sign    Worried About Running Out of Food in the Last Year:  Never true    Ran Out of Food in the Last Year: Never true  Transportation Needs: No Transportation Needs (09/22/2022)   PRAPARE - Administrator, Civil Service (Medical): No    Lack of Transportation (Non-Medical): No  Physical Activity: Not on file  Stress: Not on file  Social Connections: Not on file  Intimate Partner Violence: Not At Risk (09/22/2022)   Humiliation, Afraid, Rape, and Kick questionnaire    Fear of Current or Ex-Partner: No    Emotionally Abused: No    Physically Abused: No    Sexually Abused: No    Medications:   Current Outpatient Medications on File Prior to Visit  Medication Sig Dispense Refill   aspirin 81 MG chewable tablet Chew 1 tablet (81 mg total) by mouth daily. 30 tablet 0   atorvastatin (LIPITOR) 40 MG tablet Take 1 tablet (40 mg total) by mouth daily. 30 tablet 0   doxazosin (CARDURA) 1 MG tablet Take 1 tablet (1 mg total) by mouth at bedtime. 30 tablet 0   metoprolol tartrate (LOPRESSOR) 25 MG tablet Take 0.5 tablets (12.5 mg total) by mouth 2 (two) times daily. 15 tablet 0   mirtazapine (REMERON) 15 MG tablet Take 10 mg by mouth at bedtime.     tamsulosin (FLOMAX) 0.4 MG CAPS capsule Take 0.4 mg by  mouth daily.     No current facility-administered medications on file prior to visit.    Allergies:  No Known Allergies  Physical Exam General: well developed, well nourished pleasant elderly Caucasian male, seated, in no evident distress.  He has a indwelling Foley catheter. Head: head normocephalic and atraumatic.  Neck: supple with no carotid or supraclavicular bruits Cardiovascular: regular rate and rhythm, no murmurs Musculoskeletal: no deformity Skin:  no rash/petichiae Vascular:  Normal pulses all extremities Vitals:   10/23/23 1529  BP: 114/63  Pulse: (!) 55   Neurologic Exam Mental Status: Awake and fully alert. Oriented to place and time. Recent and remote memory intact. Attention span, concentration and fund of knowledge appropriate. Mood and affect appropriate.  Diminished recall 2/3.  Able to name only 8 animals which can walk on 4 legs.  Clock drawing 2/4. Cranial Nerves: Fundoscopic exam not done. Pupils equal, briskly reactive to light. Extraocular movements full without nystagmus. Visual fields full to confrontation. Hearing mildly diminished bilaterally t. Facial sensation intact. Face, tongue, palate moves normally and symmetrically.  Motor: Normal bulk and tone. Normal strength in all tested extremity muscles except mild weakness of left grip and intrinsic hand muscles.  Orbits right over left upper extremity.  Fine finger movements are diminished on the left.. Sensory.: intact to touch ,pinprick .position and vibratory sensation.  Coordination: Rapid alternating movements normal in all extremities. Finger-to-nose and heel-to-shin performed accurately bilaterally. Gait and Station: Arises from chair without difficulty. Stance is normal. Gait demonstrates normal stride length and mild imbalance drifts to the left while turning..  Not able to heel, toe and tandem walk without difficulty.  Reflexes: 1+ and symmetric. Toes downgoing.   NIHSS  1 Modified Rankin   2   ASSESSMENT: 82 year old Caucasian male with multiple bilateral right greater than left MCA, PCA and cerebellar infarcts following surgery for aortic dissection and surgical repair in November 2023 significant improvement.  Mild residual cognitive impairment and left hemiparesis appears stable.     PLAN:I had a long d/w patient and wife  about his recent embolic strokes, mild cognitive impairment, risk for recurrent stroke/TIAs, personally independently reviewed  imaging studies and stroke evaluation results and answered questions.Continue Aspirin  for secondary stroke prevention and maintain strict control of hypertension with blood pressure goal below 130/90, diabetes with hemoglobin A1c goal below 6.5% and lipids with LDL cholesterol goal below 70 mg/dL. I also advised the patient to eat a healthy diet with plenty of whole grains, cereals, fruits and vegetables, exercise regularly and maintain ideal body weight .I recommend he use his cane at all times and we discussed fall prevention precautions.  I also encouraged him to increase participation in cognitively challenging activities like solving crossword puzzles, playing bridge and sudoku.  We also discussed memory compensation strategies. .  I also recommend he is start taking Cerefolin NAC 1 tablet daily to help with cognitive improvement.  Followup in the future with me in 6 months or call earlier if necessary.   . Greater than 50% of time during this  35 minute visit was spent on counseling,explanation of diagnosis, planning of further management, discussion with patient and family and coordination of care Delia Heady, MD Note: This document was prepared with digital dictation and possible smart phrase technology. Any transcriptional errors that result from this process are unintentional

## 2023-10-30 DIAGNOSIS — D696 Thrombocytopenia, unspecified: Secondary | ICD-10-CM | POA: Diagnosis not present

## 2023-10-30 DIAGNOSIS — I10 Essential (primary) hypertension: Secondary | ICD-10-CM | POA: Diagnosis not present

## 2023-10-30 DIAGNOSIS — Z23 Encounter for immunization: Secondary | ICD-10-CM | POA: Diagnosis not present

## 2023-10-30 DIAGNOSIS — G3184 Mild cognitive impairment, so stated: Secondary | ICD-10-CM | POA: Diagnosis not present

## 2023-10-30 DIAGNOSIS — I6340 Cerebral infarction due to embolism of unspecified cerebral artery: Secondary | ICD-10-CM | POA: Diagnosis not present

## 2023-10-30 DIAGNOSIS — R339 Retention of urine, unspecified: Secondary | ICD-10-CM | POA: Diagnosis not present

## 2023-10-31 DIAGNOSIS — D696 Thrombocytopenia, unspecified: Secondary | ICD-10-CM | POA: Diagnosis not present

## 2023-10-31 DIAGNOSIS — I6340 Cerebral infarction due to embolism of unspecified cerebral artery: Secondary | ICD-10-CM | POA: Diagnosis not present

## 2023-10-31 DIAGNOSIS — I10 Essential (primary) hypertension: Secondary | ICD-10-CM | POA: Diagnosis not present

## 2023-12-07 DIAGNOSIS — N401 Enlarged prostate with lower urinary tract symptoms: Secondary | ICD-10-CM | POA: Diagnosis not present

## 2023-12-07 DIAGNOSIS — R339 Retention of urine, unspecified: Secondary | ICD-10-CM | POA: Diagnosis not present

## 2023-12-07 DIAGNOSIS — N319 Neuromuscular dysfunction of bladder, unspecified: Secondary | ICD-10-CM | POA: Diagnosis not present

## 2023-12-21 DIAGNOSIS — N319 Neuromuscular dysfunction of bladder, unspecified: Secondary | ICD-10-CM | POA: Diagnosis not present

## 2023-12-21 DIAGNOSIS — R339 Retention of urine, unspecified: Secondary | ICD-10-CM | POA: Diagnosis not present

## 2024-01-04 DIAGNOSIS — K080 Exfoliation of teeth due to systemic causes: Secondary | ICD-10-CM | POA: Diagnosis not present

## 2024-01-10 DIAGNOSIS — K080 Exfoliation of teeth due to systemic causes: Secondary | ICD-10-CM | POA: Diagnosis not present

## 2024-01-27 DIAGNOSIS — R339 Retention of urine, unspecified: Secondary | ICD-10-CM | POA: Diagnosis not present

## 2024-02-14 DIAGNOSIS — H524 Presbyopia: Secondary | ICD-10-CM | POA: Diagnosis not present

## 2024-02-27 DIAGNOSIS — R339 Retention of urine, unspecified: Secondary | ICD-10-CM | POA: Diagnosis not present

## 2024-02-27 DIAGNOSIS — I10 Essential (primary) hypertension: Secondary | ICD-10-CM | POA: Diagnosis not present

## 2024-02-27 DIAGNOSIS — I6340 Cerebral infarction due to embolism of unspecified cerebral artery: Secondary | ICD-10-CM | POA: Diagnosis not present

## 2024-02-27 DIAGNOSIS — D696 Thrombocytopenia, unspecified: Secondary | ICD-10-CM | POA: Diagnosis not present

## 2024-03-18 DIAGNOSIS — K080 Exfoliation of teeth due to systemic causes: Secondary | ICD-10-CM | POA: Diagnosis not present

## 2024-04-01 DIAGNOSIS — L853 Xerosis cutis: Secondary | ICD-10-CM | POA: Diagnosis not present

## 2024-04-01 DIAGNOSIS — L821 Other seborrheic keratosis: Secondary | ICD-10-CM | POA: Diagnosis not present

## 2024-04-01 DIAGNOSIS — L550 Sunburn of first degree: Secondary | ICD-10-CM | POA: Diagnosis not present

## 2024-04-01 DIAGNOSIS — D225 Melanocytic nevi of trunk: Secondary | ICD-10-CM | POA: Diagnosis not present

## 2024-04-12 DIAGNOSIS — R339 Retention of urine, unspecified: Secondary | ICD-10-CM | POA: Diagnosis not present

## 2024-05-06 DIAGNOSIS — R339 Retention of urine, unspecified: Secondary | ICD-10-CM | POA: Diagnosis not present

## 2024-05-07 DIAGNOSIS — R339 Retention of urine, unspecified: Secondary | ICD-10-CM | POA: Diagnosis not present

## 2024-05-28 DIAGNOSIS — R339 Retention of urine, unspecified: Secondary | ICD-10-CM | POA: Diagnosis not present

## 2024-06-17 ENCOUNTER — Encounter: Payer: Self-pay | Admitting: Neurology

## 2024-06-17 ENCOUNTER — Ambulatory Visit: Payer: Medicare Other | Admitting: Neurology

## 2024-06-17 VITALS — BP 97/56 | HR 63 | Ht 72.0 in | Wt 186.2 lb

## 2024-06-17 DIAGNOSIS — G3184 Mild cognitive impairment, so stated: Secondary | ICD-10-CM | POA: Diagnosis not present

## 2024-06-17 DIAGNOSIS — Z8673 Personal history of transient ischemic attack (TIA), and cerebral infarction without residual deficits: Secondary | ICD-10-CM | POA: Diagnosis not present

## 2024-06-17 DIAGNOSIS — R413 Other amnesia: Secondary | ICD-10-CM

## 2024-06-17 NOTE — Patient Instructions (Signed)
:  I had a long d/w patient and wife  about his remote embolic strokes, mild cognitive impairment, risk for recurrent stroke/TIAs, personally independently reviewed imaging studies and stroke evaluation results and answered questions.Continue Aspirin   for secondary stroke prevention and maintain strict control of hypertension with blood pressure goal below 130/90, diabetes with hemoglobin A1c goal below 6.5% and lipids with LDL cholesterol goal below 70 mg/dL. I also advised the patient to eat a healthy diet with plenty of whole grains, cereals, fruits and vegetables, exercise regularly and maintain ideal body weight  I also encouraged him to increase participation in cognitively challenging activities like solving crossword puzzles, playing bridge and sudoku.  We also discussed memory compensation strategies. .  I also recommend he continue Cerefolin NAC 1 tablet daily to help with cognitive improvement.  Followup in the future with me in 1 year  or call earlier if necessary.   .  Memory Compensation Strategies  Use WARM strategy.  W= write it down  A= associate it  R= repeat it  M= make a mental note  2.   You can keep a Glass blower/designer.  Use a 3-ring notebook with sections for the following: calendar, important names and phone numbers,  medications, doctors' names/phone numbers, lists/reminders, and a section to journal what you did  each day.   3.    Use a calendar to write appointments down.  4.    Write yourself a schedule for the day.  This can be placed on the calendar or in a separate section of the Memory Notebook.  Keeping a  regular schedule can help memory.  5.    Use medication organizer with sections for each day or morning/evening pills.  You may need help loading it  6.    Keep a basket, or pegboard by the door.  Place items that you need to take out with you in the basket or on the pegboard.  You may also want to  include a message board for reminders.  7.    Use sticky  notes.  Place sticky notes with reminders in a place where the task is performed.  For example:  turn off the  stove placed by the stove, lock the door placed on the door at eye level,  take your medications on  the bathroom mirror or by the place where you normally take your medications.  8.    Use alarms/timers.  Use while cooking to remind yourself to check on food or as a reminder to take your medicine, or as a  reminder to make a call, or as a reminder to perform another task, etc.

## 2024-06-17 NOTE — Progress Notes (Signed)
 [facility] [address] [place]. [place] [address] [phone]       OFFICE FOLLOW-UP NOTE  [name] Date of Birth:  Apr 01, 1941 Medical Record Number:  [id number]   HPI: Initial visit 11/22/2022 :Mr.James Estrada is a pleasant 83 year old Caucasian male seen today for initial office follow-up visit following hospital consultation for stroke in November 2023.  He is accompanied by his wife and daughter and history is obtained from them and review of electronic medical records.  I have also personally reviewed pertinent available imaging films in PACS. James Estrada is a 83 y.o. male with a past medical history AAA s/p EVAR with a known internal iliac artery aneurysm, arthritis, bradycardia, dysrhythmia, GERD, hemorrhoids, HLD, and HTN presenting from home via EMS for acute onset of severe BLE weakness. He was ambulating and had sudden onset of the BLE weakness resulting in a fall. Triage RN exam did an NIHSS and only deficits were leg weakness, left worse than right. Around 1550 he was outside talking to neighbors when he felt acutely weak in his BLE and lightheaded, in conjunction with acute onset of mild left neck pain and some left flank pain. He fell due to his legs collapsing under him and had to drag himself on the ground to move when he tried to get to the phone. He did make it up the steps and was able to get to the phone in his bedroom to call EMS. He was brought in to the ED where a Code Stroke was called. Since his arrival to the ED and while in CT his left flank pain has steadily worsened and his blood pressures have steadily dropped.  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  He also had evidence of retroperitoneal bleed.  He underwent emergent repair of type I aortic dissection by Dr. Kerrin.  He was able to move all extremities and was following simple commands by the morning  of the first postoperative day however unfortunately later he was not able to follow commands equally on the right and the left side of his upper extremities.  Neurology was consulted.  MRI scan of the brain was obtained which showed numerous bilateral acute infarcts right greater than left involving MCA and PCA territories as well as cerebellum.  He was found to have transient atrial fibrillation which was treated with amiodarone  drip and he converted to normal sinus rhythm soon.  EEG showed no seizure activity.  CT angiogram showed partially visualized aortic dissection into the right brachiocephalic and also into the origin of the right common carotid artery.  CT angiogram of the brain showed mild intracranial atherosclerotic changes.  Echocardiogram showed ejection fraction of 60 to 65%.  LDL cholesterol was 42 mg percent.  Hemoglobin A1c was 5.5.  Patient on examination he has significant left hemiplegia.  He was transferred to inpatient rehab and showed gradual improvement and discharged home.  He is doing well and feels he is made almost a full recovery.  He still has some diminished fine motor skills in the left side.  He is ambulating with a cane and occasionally feels off balance. ' His balance is still not good and he stumbles and fell once a few days ago  l.  His main deficit to be cognitive slowing.  He has trouble finding words and has to think about answers.  Short-term memory is also poor.  Physical and  Occupational Therapy still getting some therapy for cognition. Update 03/27/2023 : He returns for follow-up after last visit 4 months ago.  He is accompanied by his wife.  He continues to have mild memory and balance difficulties but these are unchanged.  Patient has not been participating in any mentally challenging activities on a regular basis.  He needs some help with his medications.  But otherwise is pretty independent.  He is currently participating in outpatient speech therapy which seems to  be helping him.  He remains on aspirin  tolerating well without bruising or bleeding.  Blood pressure is under good control.  No recurrent stroke or TIA symptoms lab work at last visit on 1/9/20204 when Vitamin B12, TSH, homocystine and RPR were all normal.  Done on 11/24/2022 was also normal without any epileptiform activity.  He has no new complaints today. Update 10/23/2023 : He returns for follow-up after last visit 6 months ago.  He is accompanied by his wife.  He continues to downplay his memory difficulties with his wife feels he is doing slightly better.  He has not been quite active and participation in mentally challenging activities on regular basis.  He continues to have mild gait and balance difficulties but he is learned to walk slowly and has not had any falls or injuries.  He keeps himself physically active but has not been able to do new mentally challenging jobs.  He has no new complaints.  He did have follow-up CT scan of the chest abdomen pelvis done on 08/18/2023 which showed stable appearance of the ascending thoracic aortic repair of dissection with normally patent ascending aortic graft.  He did follow-up with Dr. Magda vascular surgeon recently  as well. Update 06/17/2024 : Patient returns for follow-up after last visit 8 months ago.  He is accompanied by his wife.  He states he is doing well.  Has had no recurrent stroke or TIA symptoms.  He remains on aspirin  which is tolerating well without bruising or bleeding.  He states his blood pressure tends to run low.  And today it is 97/56.  I advised him to discuss this with his primary physician maintain good hydration.  He continues to have mild short-term memory and occasional word finding difficulties which are unchanged.  He occasionally struggles to get words out.  He has stopped doing the speech therapy exercises which he had learned from his speech therapist in the past.  He is not participating in mentally challenging activities on a  regular basis.  He is taking Cerefolin NAC 1 tablet daily.  He is still fully independent in all actives of daily living.  Planes.  He has had no new health issues since last visit. ROS:   14 system review of systems is positive for memory loss, imbalance, cognitive slowing, word finding difficulties, weakness, gait difficulty and all other systems negative  PMH:  Past Medical History:  Diagnosis Date   AAA (abdominal aortic aneurysm) (HCC)    last u/s done 07/18/17    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) 05/22/2022   Arthritis    Bradycardia    CVA (cerebral vascular accident) (HCC) 10/04/2022   Dysrhythmia    frequent PAC, for 20 years   GERD (gastroesophageal reflux disease)    occ, OTC   Hemorrhoids    History of hiatal hernia    Hyperlipidemia    Hypertension    S/P aortic dissection repair 09/18/2022   Seasonal allergies    Type 1 dissection  of thoracic aorta (HCC) 10/26/2022    Social History:  Social History   Socioeconomic History   Marital status: Married    Spouse name: Not on file   Number of children: Not on file   Years of education: Not on file   Highest education level: Not on file  Occupational History   Not on file  Tobacco Use   Smoking status: Never   Smokeless tobacco: Never  Vaping Use   Vaping status: Never Used  Substance and Sexual Activity   Alcohol use: Yes    Comment: 2 or 3 drinks on weekends   Drug use: No   Sexual activity: Not on file  Other Topics Concern   Not on file  Social History Narrative   Not on file   Social Drivers of Health   Financial Resource Strain: Not on file  Food Insecurity: No Food Insecurity (09/22/2022)   Hunger Vital Sign    Worried About Running Out of Food in the Last Year: Never true    Ran Out of Food in the Last Year: Never true  Transportation Needs: No Transportation Needs (09/22/2022)   PRAPARE - Administrator, Civil Service (Medical): No    Lack of Transportation  (Non-Medical): No  Physical Activity: Not on file  Stress: Not on file  Social Connections: Not on file  Intimate Partner Violence: Not At Risk (09/22/2022)   Humiliation, Afraid, Rape, and Kick questionnaire    Fear of Current or Ex-Partner: No    Emotionally Abused: No    Physically Abused: No    Sexually Abused: No    Medications:   Current Outpatient Medications on File Prior to Visit  Medication Sig Dispense Refill   aspirin  81 MG chewable tablet Chew 1 tablet (81 mg total) by mouth daily. 30 tablet 0   atorvastatin  (LIPITOR) 40 MG tablet Take 1 tablet (40 mg total) by mouth daily. 30 tablet 0   doxazosin  (CARDURA ) 1 MG tablet Take 1 tablet (1 mg total) by mouth at bedtime. 30 tablet 0   L-Methylfolate-B12-B6-B2 (CEREFOLIN) 04-14-49-5 MG TABS Take 1 tablet by mouth daily. 90 tablet 3   metoprolol  tartrate (LOPRESSOR ) 25 MG tablet Take 0.5 tablets (12.5 mg total) by mouth 2 (two) times daily. 15 tablet 0   mirtazapine (REMERON) 15 MG tablet Take 10 mg by mouth at bedtime.     tamsulosin  (FLOMAX ) 0.4 MG CAPS capsule Take 0.4 mg by mouth daily.     No current facility-administered medications on file prior to visit.    Allergies:  No Known Allergies  Physical Exam General: well developed, well nourished pleasant elderly Caucasian male, seated, in no evident distress.  He has a indwelling Foley catheter. Head: head normocephalic and atraumatic.  Neck: supple with no carotid or supraclavicular bruits Cardiovascular: regular rate and rhythm, no murmurs Musculoskeletal: no deformity Skin:  no rash/petichiae Vascular:  Normal pulses all extremities Vitals:   06/17/24 1503  BP: (!) 97/56  Pulse: 63  SpO2: 97%   Neurologic Exam Mental Status: Awake and fully alert. Oriented to place and time. Recent and remote memory intact. Attention span, concentration and fund of knowledge appropriate. Mood and affect appropriate.  Diminished recall 2/3.  Able to name only 8 animals which can  walk on 4 legs.  Clock drawing 3/4. Cranial Nerves: Fundoscopic exam not done. Pupils equal, briskly reactive to light. Extraocular movements full without nystagmus. Visual fields full to confrontation. Hearing mildly diminished bilaterally t. Facial sensation  intact. Face, tongue, palate moves normally and symmetrically.  Motor: Normal bulk and tone. Normal strength in all tested extremity muscles except mild weakness of left grip and intrinsic hand muscles.  Orbits right over left upper extremity.  Fine finger movements are diminished on the left.. Sensory.: intact to touch ,pinprick .position and vibratory sensation.  Coordination: Rapid alternating movements normal in all extremities. Finger-to-nose and heel-to-shin performed accurately bilaterally. Gait and Station: Arises from chair without difficulty. Stance is normal. Gait demonstrates normal stride length and mild imbalance drifts to the left while turning..  Not able to heel, toe and tandem walk without difficulty.  Reflexes: 1+ and symmetric. Toes downgoing.   NIHSS  1 Modified Rankin  2   ASSESSMENT: 83 year old Caucasian male with multiple bilateral right greater than left MCA, PCA and cerebellar infarcts following surgery for aortic dissection and surgical repair in November 2023 significant improvement.  Mild residual cognitive impairment and left hemiparesis appears stable.     PLAN:I had a long d/w patient and wife  about his remote embolic strokes, mild cognitive impairment, risk for recurrent stroke/TIAs, personally independently reviewed imaging studies and stroke evaluation results and answered questions.Continue Aspirin   for secondary stroke prevention and maintain strict control of hypertension with blood pressure goal below 130/90, diabetes with hemoglobin A1c goal below 6.5% and lipids with LDL cholesterol goal below 70 mg/dL. I also advised the patient to eat a healthy diet with plenty of whole grains, cereals, fruits and  vegetables, exercise regularly and maintain ideal body weight  I also encouraged him to increase participation in cognitively challenging activities like solving crossword puzzles, playing bridge and sudoku.  We also discussed memory compensation strategies. .  I also recommend he continue Cerefolin NAC 1 tablet daily to help with cognitive improvement.  Followup in the future with me in 1 year  or call earlier if necessary.   .   I personally spent a total of 35 minutes in the care of the patient today including getting/reviewing separately obtained history, performing a medically appropriate exam/evaluation, counseling and educating, placing orders, referring and communicating with other health care professionals, documenting clinical information in the EHR, independently interpreting results, and coordinating care.        Eather Popp, MD Note: This document was prepared with digital dictation and possible smart phrase technology. Any transcriptional errors that result from this process are unintentional

## 2024-06-18 DIAGNOSIS — R339 Retention of urine, unspecified: Secondary | ICD-10-CM | POA: Diagnosis not present

## 2024-07-15 DIAGNOSIS — R339 Retention of urine, unspecified: Secondary | ICD-10-CM | POA: Diagnosis not present

## 2024-07-18 ENCOUNTER — Other Ambulatory Visit: Payer: Self-pay | Admitting: Thoracic Surgery (Cardiothoracic Vascular Surgery)

## 2024-07-18 DIAGNOSIS — I7101 Dissection of ascending aorta: Secondary | ICD-10-CM

## 2024-07-18 DIAGNOSIS — Z9889 Other specified postprocedural states: Secondary | ICD-10-CM

## 2024-07-18 DIAGNOSIS — I71019 Dissection of thoracic aorta, unspecified: Secondary | ICD-10-CM

## 2024-08-14 DIAGNOSIS — R339 Retention of urine, unspecified: Secondary | ICD-10-CM | POA: Diagnosis not present

## 2024-08-20 ENCOUNTER — Ambulatory Visit (HOSPITAL_COMMUNITY)
Admission: RE | Admit: 2024-08-20 | Discharge: 2024-08-20 | Disposition: A | Discharge status: Home / Self Care | Source: Ambulatory Visit | Attending: Thoracic Surgery (Cardiothoracic Vascular Surgery) | Admitting: Thoracic Surgery (Cardiothoracic Vascular Surgery)

## 2024-08-20 DIAGNOSIS — R918 Other nonspecific abnormal finding of lung field: Secondary | ICD-10-CM | POA: Diagnosis not present

## 2024-08-20 DIAGNOSIS — K449 Diaphragmatic hernia without obstruction or gangrene: Secondary | ICD-10-CM | POA: Insufficient documentation

## 2024-08-20 DIAGNOSIS — I7771 Dissection of carotid artery: Secondary | ICD-10-CM | POA: Diagnosis not present

## 2024-08-20 DIAGNOSIS — I7143 Infrarenal abdominal aortic aneurysm, without rupture: Secondary | ICD-10-CM | POA: Diagnosis not present

## 2024-08-20 DIAGNOSIS — I7101 Dissection of ascending aorta: Secondary | ICD-10-CM | POA: Insufficient documentation

## 2024-08-20 DIAGNOSIS — Z9889 Other specified postprocedural states: Secondary | ICD-10-CM | POA: Insufficient documentation

## 2024-08-20 DIAGNOSIS — I723 Aneurysm of iliac artery: Secondary | ICD-10-CM | POA: Diagnosis not present

## 2024-08-20 DIAGNOSIS — I7779 Dissection of other artery: Secondary | ICD-10-CM | POA: Diagnosis not present

## 2024-08-20 DIAGNOSIS — I71019 Dissection of thoracic aorta, unspecified: Secondary | ICD-10-CM | POA: Insufficient documentation

## 2024-08-20 MED ORDER — IOHEXOL 350 MG/ML SOLN
100.0000 mL | Freq: Once | INTRAVENOUS | Status: AC | PRN
  Administered 2024-08-20: 100 mL via INTRAVENOUS

## 2024-08-21 ENCOUNTER — Ambulatory Visit (HOSPITAL_COMMUNITY)

## 2024-08-27 ENCOUNTER — Ambulatory Visit (HOSPITAL_COMMUNITY)

## 2024-09-02 NOTE — Progress Notes (Unsigned)
 [address] [place] [address]             [phone]            James Estrada [id number] 06-17-1941   History of Present Illness:  James Estrada is a 83 year old man with medical history of hypertension, CVA, aneurysm of left internal iliac artery, abdominal aortic aneurysm with endovascular stent graft, BPH and hyperlipidemia who presents for continued follow-up for aortic dissection repair for a type I aortic dissection in 2023.  In November 2023 presented with chest pain, syncope and bilateral leg weakness.  He underwent a repair of a type I aortic dissection with Dr. Kerrin.  He then had a stroke with left hemiparesis and aphasia.  He is followed by neurology and has had no recurrent stroke or TIA symptoms.   He presents to the clinic today with his wife and reports that he has been doing well.  He has noticed improvement of his neurological symptoms but sometimes gets discouraged since his symptoms have not completely resolved at this time and he feels that his progress has been slow.  He gets very frustrated with his aphasia and word finding.  His blood pressure is well-controlled with current medication therapy.  He does exercise 3 times a week for an hour on the elliptical.  He does lift weights for exercise as well and max weight is roughly 70 pounds.   He denies chest pain, shortness of breath and lower leg swelling.  Current Outpatient Medications on File Prior to Visit  Medication Sig Dispense Refill   aspirin  81 MG chewable tablet Chew 1 tablet (81 mg total) by mouth daily. 30 tablet 0   atorvastatin  (LIPITOR) 40 MG tablet Take 1 tablet (40 mg total) by mouth daily. 30 tablet 0   citalopram  (CELEXA ) 20 MG tablet Take 20 mg by mouth daily.     doxazosin  (CARDURA ) 1 MG tablet Take 1 tablet (1 mg total) by mouth at bedtime. 30 tablet 0   L-Methylfolate-B12-B6-B2 (CEREFOLIN) 04-14-49-5 MG TABS Take 1 tablet by mouth daily. 90 tablet 3    metoprolol  tartrate (LOPRESSOR ) 25 MG tablet Take 0.5 tablets (12.5 mg total) by mouth 2 (two) times daily. 15 tablet 0   mirtazapine (REMERON) 15 MG tablet Take 10 mg by mouth at bedtime.     tamsulosin  (FLOMAX ) 0.4 MG CAPS capsule Take 0.4 mg by mouth daily.     No current facility-administered medications on file prior to visit.     ROS: Review of Systems  Constitutional: Negative.  Negative for malaise/fatigue.  Respiratory: Negative.  Negative for cough, shortness of breath and wheezing.   Cardiovascular:  Negative for chest pain and leg swelling.  Neurological:  Positive for speech change.     BP 129/60   Pulse 72   Resp 20   Ht 6' (1.829 m)   Wt 190 lb (86.2 kg)   SpO2 95% Comment: RA  BMI 25.77 kg/m   Physical Exam Constitutional:      Appearance: Normal appearance.  HENT:     Head: Normocephalic and atraumatic.  Cardiovascular:     Rate and Rhythm: Normal rate and regular rhythm.     Heart sounds: Normal heart sounds, S1 normal and S2 normal.  Pulmonary:     Effort: Pulmonary effort is normal.     Breath sounds: Normal breath sounds.  Skin:    General: Skin is  warm and dry.  Neurological:     General: No focal deficit present.     Mental Status: He is alert and oriented to person, place, and time.      Imaging:  CLINICAL DATA:  Ascending thoracic aortic dissection repair. History of hemi arch repair. Endovascular repair of abdominal aortic aneurysm.   EXAM: CT ANGIOGRAPHY CHEST, ABDOMEN AND PELVIS   TECHNIQUE: Non-contrast CT of the chest was initially obtained.   Multidetector CT imaging through the chest, abdomen and pelvis was performed using the standard protocol during bolus administration of intravenous contrast. Multiplanar reconstructed images and MIPs were obtained and reviewed to evaluate the vascular anatomy.   RADIATION DOSE REDUCTION: This exam was performed according to the departmental dose-optimization program which includes  automated exposure control, adjustment of the mA and/or kV according to patient size and/or use of iterative reconstruction technique.   CONTRAST:  OMNIPAQUE  IOHEXOL  350 MG/ML SOLN   COMPARISON:  08/18/2023   FINDINGS: CTA CHEST FINDINGS   Cardiovascular: Again noted is repair of the ascending thoracic aorta with a surgical graft. Surgical graft is patent. Overall configuration of the surgical repair is minimally changed. There is a small focus of peripheral contrast along the posterior aspect of the graft near the proximal arch on image 55/303 and this is slightly more prominent than it was on the exam from 2024. This finding is nonspecific. Recommend attention to this area on follow up imaging. No significant inflammatory changes or fluid around this area. Chronic dissection involving the innominate artery with extension into the proximal right common carotid artery and proximal right subclavian artery. The dissection is unchanged in configuration and extent. Right subclavian and right axillary artery are patent. Visualized bilateral common carotid arteries are patent. Left subclavian artery and left axillary artery are patent. Proximal bilateral vertebral arteries are patent. Stable appearance of the aortic arch. Proximal descending thoracic aorta measures 3.0 cm and stable. Mild atherosclerotic disease in the descending thoracic aorta without dissection. Main bilateral pulmonary arteries are patent. Heart size is normal. No significant pericardial effusion.   Mediastinum/Nodes: Small hiatal hernia. Thyroid  tissue is unremarkable. No mediastinal, hilar or axillary lymph node enlargement.   Lungs/Pleura: Stable mild scarring at the lung apices. Small subpleural nodular density in the anterior right middle lobe on image 102/304 has minimally changed. No significant airspace disease or lung consolidation. No pleural effusions. Stable 3 mm nodule in the right lower lobe on  image 108/304. Stable 4 mm subpleural nodule in the lateral right lower lobe on image 106/304. Additional stable small nodules in both lungs.   Musculoskeletal: No acute bone abnormality. Median sternotomy wires.   Review of the MIP images confirms the above findings.   CTA ABDOMEN AND PELVIS FINDINGS   VASCULAR   Aorta: No pre contrast images obtained of the abdominal aorta. Endovascular repair of the infrarenal abdominal aortic aneurysm with a bifurcated aortic stent graft. Stent graft is positioned just below the renal arteries. Bifurcated aortic stent graft is widely patent. Limbs extending into the common iliac arteries bilaterally. Native abdominal aortic aneurysm sac measures up to 3.7 cm and previously measured 3.4 cm. Limited evaluation for an endoleak because pre contrast and delayed images of the aorta were not obtained.   Celiac: Distal celiac artery measures up to 1.2 cm and not significantly changed. Celiac artery is widely patent. Main branch vessels of the celiac artery patent.   SMA: Patent without evidence of aneurysm, dissection, vasculitis or significant stenosis.   Renals:  Both renal arteries are patent without evidence of aneurysm, dissection, vasculitis, fibromuscular dysplasia or significant stenosis.   IMA: IMA is occluded at the origin. Distal reconstitution of the IMA through collateral flow.   Inflow: Stent grafts extend into the common iliac arteries bilaterally. The native distal right common iliac artery measures 2.6 cm and previously measured 2.5 cm. Right internal and right external iliac arteries are patent. Again noted is aneurysm of the right internal iliac artery containing mural thrombus that measures 3.0 cm and previously measured 2.9 cm.   Distal left common iliac artery measures 2.8 cm and previously measured 2.7 cm. Left internal and left external iliac arteries are patent. Aneurysmal dilatation of the left internal iliac artery  near the origin measures 3.6 cm and previously measured 3.5 cm. Left external iliac artery is widely patent without aneurysm or dissection.   Proximal femoral arteries are patent bilaterally.   Veins: No obvious venous abnormality within the limitations of this arterial phase study.   Review of the MIP images confirms the above findings.   NON-VASCULAR   Hepatobiliary: Again noted are low-density structures in the liver that are most compatible with hepatic cysts. Normal appearance of the gallbladder without inflammatory changes.   Pancreas: Mild dilatation of the main pancreatic duct that has not significantly changed. No evidence for pancreatic duct inflammation.   Spleen: Normal in size without focal abnormality.   Adrenals/Urinary Tract: Surgical clips near the left adrenal gland. Normal appearance of the right adrenal gland. Hyperdense exophytic structure involving the anterior left kidney in the upper/mid pole region. This structure measures 9 x 9 mm and previously measured 9 x 8 mm. This structure is seen on image 146/303. This structure has been present on multiple examinations and was hyperdense on the precontrast images from 2024. This structure measured up to 7 mm in 2020. Extrarenal pelvis in both kidneys. No hydronephrosis. Again noted is chronic bladder wall thickening with bladder diverticulum along left posterior aspect of the bladder.   Stomach/Bowel: Small hiatal hernia. No evidence for bowel dilatation or obstruction. Colonic diverticula without acute bowel inflammation.   Lymphatic: No significant lymph node enlargement in the abdomen or pelvis.   Reproductive: Stable appearance of the prostate.   Other: Right inguinal hernia. Densities within the right inguinal canal may be related to fluid or the testicle. No ascites. Surgical mesh in the right lower quadrant of the abdomen.   Musculoskeletal: Right hip replacement is located. No acute  bone abnormality. Disc space narrowing at L3-L4 and L5-S1.   Review of the MIP images confirms the above findings.   IMPRESSION: 1. Repair of the ascending thoracic aorta. The surgical graft is patent. Overall, the configuration of the surgical graft has not significantly changed. There is one area in the graft near the arch that may be slightly different with a small outpouching of contrast in this area. Recommend attention to this area on follow up imaging. 2. Stable dissection involving the innominate artery, proximal right common carotid artery and proximal right subclavian artery. 3. Endovascular repair of the abdominal aortic aneurysm. The native aortic aneurysm sac may be slightly enlarged compared to the previous examination, measuring 3.7 cm compared to 3.4 cm. An endoleak is not confidently identified but limited evaluation on this single phase of abdominal imaging. Recommend continued surveillance and recommend follow-up with dedicated post stent CTA. 4. Aneurysmal dilatation of bilateral common iliac arteries has not significantly changed. 5. Aneurysms involving bilateral internal iliac arteries have not significantly changed. 6.  Stable small pulmonary nodules. 7. Hyperdense exophytic structure involving the left kidney has not significantly changed since 2024. This structure measures up to 9 mm. This has minimally enlarged since 2020. This small structure is indeterminate on this examination and recommend attention on follow-up imaging. 8. Small hiatal hernia. 9. New densities within the right inguinal canal. Findings could represent fluid or even the testicle. Recommend clinical correlation in this area.     Electronically Signed   By: Juliene Balder M.D.   On: 08/20/2024 17:11      A/P:  S/P aortic dissection repair and 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) -We reviewed CTA results.  Stable surgical changes to ascending thoracic aorta.  Surgical graft is  patent. - Stable dissection involving the innominate artery, proximal right common carotid artery and proximal right subclavian artery -Discussed importance of blood pressure control and he is to continue with current medication therapy at this time - Lift restriction given and since he is comfortable lifting 70 pounds he should not lift over this at the gym.  Discussed that he should not do sustained Valsalva maneuvers and if he were to start to notice that he has to hold his breath or grunt to lift them that weight is too heavy.  Focus on higher repetitions with lighter weights. -Follow up in one year with CTA of chest/abd/pelvis  Aneurysm of left internal iliac artery -Discussed with patient that aneurysmal areas are stable in size. - He has not seen vascular since 2023 prior to repair of aortic dissection - Replaced referral for him to go back to see vascular since he has had abdominal aortic aneurysm graft and iliac artery stenting   Manuelita CHRISTELLA Rough, PA-C 09/03/24

## 2024-09-03 ENCOUNTER — Ambulatory Visit

## 2024-09-03 ENCOUNTER — Ambulatory Visit: Admitting: Thoracic Surgery (Cardiothoracic Vascular Surgery)

## 2024-09-03 VITALS — BP 129/60 | HR 72 | Resp 20 | Ht 72.0 in | Wt 190.0 lb

## 2024-09-03 DIAGNOSIS — I723 Aneurysm of iliac artery: Secondary | ICD-10-CM | POA: Diagnosis not present

## 2024-09-03 DIAGNOSIS — I7101 Dissection of ascending aorta: Secondary | ICD-10-CM | POA: Diagnosis not present

## 2024-09-03 DIAGNOSIS — Z9889 Other specified postprocedural states: Secondary | ICD-10-CM | POA: Diagnosis not present

## 2024-09-03 NOTE — Patient Instructions (Signed)
-  Follow up in one year with CTA of chest/abd/pelvis -Referral placed for vascular  -Patient was counseled on importance of Blood Pressure Control  They are instructed to contact their Primary Care Physician if they start to have blood pressure readings over 130s/90s. Do not ever stop blood pressure medications on your own, unless instructed by healthcare professional. -Please avoid use of Fluoroquinolones as this can potentially increase your risk of Aortic Rupture and/or Dissection

## 2024-09-04 DIAGNOSIS — Z0000 Encounter for general adult medical examination without abnormal findings: Secondary | ICD-10-CM | POA: Diagnosis not present

## 2024-09-04 DIAGNOSIS — G3184 Mild cognitive impairment, so stated: Secondary | ICD-10-CM | POA: Diagnosis not present

## 2024-09-04 DIAGNOSIS — I10 Essential (primary) hypertension: Secondary | ICD-10-CM | POA: Diagnosis not present

## 2024-09-04 DIAGNOSIS — D696 Thrombocytopenia, unspecified: Secondary | ICD-10-CM | POA: Diagnosis not present

## 2024-09-16 ENCOUNTER — Other Ambulatory Visit: Payer: Self-pay | Admitting: Vascular Surgery

## 2024-09-16 DIAGNOSIS — I723 Aneurysm of iliac artery: Secondary | ICD-10-CM

## 2024-09-16 DIAGNOSIS — I714 Abdominal aortic aneurysm, without rupture, unspecified: Secondary | ICD-10-CM

## 2024-09-18 ENCOUNTER — Other Ambulatory Visit: Payer: Self-pay | Admitting: *Deleted

## 2024-09-18 NOTE — Telephone Encounter (Addendum)
 Dr. Rosemarie- [facility] called. They are saying Cerefolin will no longer be available to be dispensed. Rx has to be for Cerefolin NAC specifically. Current rx on file only Cerefolin.   Did you want to send in Cerefolin NAC? If so, what qty and refills? I have rx below for you to complete and e-scribe when ready

## 2024-09-21 MED ORDER — CEREFOLIN NAC 6-90.314-2-600 MG PO TABS: 1.0000 | ORAL_TABLET | Freq: Every day | ORAL | 3 refills | Status: AC

## 2024-09-23 NOTE — Telephone Encounter (Signed)
 Dr. Rosemarie- I see you sent but can you add note that you approved change?

## 2024-09-25 DIAGNOSIS — R2689 Other abnormalities of gait and mobility: Secondary | ICD-10-CM | POA: Diagnosis not present

## 2024-09-25 DIAGNOSIS — R269 Unspecified abnormalities of gait and mobility: Secondary | ICD-10-CM | POA: Diagnosis not present

## 2024-09-25 DIAGNOSIS — M6281 Muscle weakness (generalized): Secondary | ICD-10-CM | POA: Diagnosis not present

## 2024-09-25 DIAGNOSIS — I69354 Hemiplegia and hemiparesis following cerebral infarction affecting left non-dominant side: Secondary | ICD-10-CM | POA: Diagnosis not present

## 2024-10-01 DIAGNOSIS — I69354 Hemiplegia and hemiparesis following cerebral infarction affecting left non-dominant side: Secondary | ICD-10-CM | POA: Diagnosis not present

## 2024-10-01 DIAGNOSIS — R269 Unspecified abnormalities of gait and mobility: Secondary | ICD-10-CM | POA: Diagnosis not present

## 2024-10-01 DIAGNOSIS — M6281 Muscle weakness (generalized): Secondary | ICD-10-CM | POA: Diagnosis not present

## 2024-10-01 DIAGNOSIS — R2689 Other abnormalities of gait and mobility: Secondary | ICD-10-CM | POA: Diagnosis not present

## 2024-10-03 DIAGNOSIS — R269 Unspecified abnormalities of gait and mobility: Secondary | ICD-10-CM | POA: Diagnosis not present

## 2024-10-03 DIAGNOSIS — M6281 Muscle weakness (generalized): Secondary | ICD-10-CM | POA: Diagnosis not present

## 2024-10-03 DIAGNOSIS — R2689 Other abnormalities of gait and mobility: Secondary | ICD-10-CM | POA: Diagnosis not present

## 2024-10-03 DIAGNOSIS — I69354 Hemiplegia and hemiparesis following cerebral infarction affecting left non-dominant side: Secondary | ICD-10-CM | POA: Diagnosis not present

## 2024-10-08 DIAGNOSIS — I69354 Hemiplegia and hemiparesis following cerebral infarction affecting left non-dominant side: Secondary | ICD-10-CM | POA: Diagnosis not present

## 2024-10-08 DIAGNOSIS — R269 Unspecified abnormalities of gait and mobility: Secondary | ICD-10-CM | POA: Diagnosis not present

## 2024-10-08 DIAGNOSIS — R2689 Other abnormalities of gait and mobility: Secondary | ICD-10-CM | POA: Diagnosis not present

## 2024-10-08 DIAGNOSIS — M6281 Muscle weakness (generalized): Secondary | ICD-10-CM | POA: Diagnosis not present

## 2024-10-09 DIAGNOSIS — M6281 Muscle weakness (generalized): Secondary | ICD-10-CM | POA: Diagnosis not present

## 2024-10-09 DIAGNOSIS — I69354 Hemiplegia and hemiparesis following cerebral infarction affecting left non-dominant side: Secondary | ICD-10-CM | POA: Diagnosis not present

## 2024-10-09 DIAGNOSIS — R2689 Other abnormalities of gait and mobility: Secondary | ICD-10-CM | POA: Diagnosis not present

## 2024-10-09 DIAGNOSIS — R269 Unspecified abnormalities of gait and mobility: Secondary | ICD-10-CM | POA: Diagnosis not present

## 2024-10-14 DIAGNOSIS — R339 Retention of urine, unspecified: Secondary | ICD-10-CM | POA: Diagnosis not present

## 2024-10-15 DIAGNOSIS — R269 Unspecified abnormalities of gait and mobility: Secondary | ICD-10-CM | POA: Diagnosis not present

## 2024-10-15 DIAGNOSIS — M6281 Muscle weakness (generalized): Secondary | ICD-10-CM | POA: Diagnosis not present

## 2024-10-15 DIAGNOSIS — I69354 Hemiplegia and hemiparesis following cerebral infarction affecting left non-dominant side: Secondary | ICD-10-CM | POA: Diagnosis not present

## 2024-10-15 DIAGNOSIS — R2689 Other abnormalities of gait and mobility: Secondary | ICD-10-CM | POA: Diagnosis not present

## 2024-10-16 DIAGNOSIS — N3091 Cystitis, unspecified with hematuria: Secondary | ICD-10-CM | POA: Diagnosis not present

## 2024-10-17 DIAGNOSIS — I69354 Hemiplegia and hemiparesis following cerebral infarction affecting left non-dominant side: Secondary | ICD-10-CM | POA: Diagnosis not present

## 2024-10-17 DIAGNOSIS — R269 Unspecified abnormalities of gait and mobility: Secondary | ICD-10-CM | POA: Diagnosis not present

## 2024-10-17 DIAGNOSIS — M6281 Muscle weakness (generalized): Secondary | ICD-10-CM | POA: Diagnosis not present

## 2024-10-17 DIAGNOSIS — N3090 Cystitis, unspecified without hematuria: Secondary | ICD-10-CM | POA: Diagnosis not present

## 2024-10-17 DIAGNOSIS — R2689 Other abnormalities of gait and mobility: Secondary | ICD-10-CM | POA: Diagnosis not present

## 2024-11-12 ENCOUNTER — Ambulatory Visit (HOSPITAL_COMMUNITY)
Admission: RE | Admit: 2024-11-12 | Discharge: 2024-11-12 | Disposition: A | Discharge status: Home / Self Care | Source: Ambulatory Visit | Attending: Vascular Surgery | Admitting: Vascular Surgery

## 2024-11-12 ENCOUNTER — Ambulatory Visit: Admitting: Vascular Surgery

## 2024-11-12 ENCOUNTER — Encounter: Payer: Self-pay | Admitting: Vascular Surgery

## 2024-11-12 VITALS — BP 133/70 | HR 109 | Temp 98.2°F | Ht 72.0 in | Wt 190.0 lb

## 2024-11-12 DIAGNOSIS — I723 Aneurysm of iliac artery: Secondary | ICD-10-CM

## 2024-11-12 DIAGNOSIS — I714 Abdominal aortic aneurysm, without rupture, unspecified: Secondary | ICD-10-CM | POA: Insufficient documentation

## 2024-11-12 NOTE — Progress Notes (Signed)
 [facility] [place]  ASSESSMENT / PLAN: James Estrada is a 83 y.o. male status post EVAR for rapidly enlarging infrarenal abdominal aortic aneurysm 05/04/20. Patient has known left internal iliac artery aneurysm which will need to be observed going forward. Check CT angiogram in 12 months. Aneurysms currently at threshold of 35mm for intervention. Counseled patient that this threshold is extrapolated and may be too aggressive. Counseled that if the aneurysm enlarges I will offer coil embolization vs. IBD as a Solution.  CHIEF COMPLAINT: EVAR surveillance  HISTORY OF PRESENT ILLNESS: James Estrada is a 83 y.o. male status post EVAR 05/04/20 with Dr. Oris.  He did very well from this.  He has had no trouble since surgery.  No abdominal complaints.  We reviewed his most recent surveillance duplex today.  We had a 10-minute conversation about the natural history of aneurysm disease and the rationale for surveillance.  07/26/22: returns for surveillance. We reviewed his scans. No new symptoms or concerns.   09/26/23: Returns for surveillance.  He suffered a type a aortic dissection which was repaired by Dr. Kerrin 09/22/2022.  He is doing well overall at this time.  We reviewed his most recent CT scan in detail.  11/12/24: Returns to clinic for evaluation. Reviewed duplex in detail. Good result from EVAR. Internal iliac aneurysms about the same.  VASCULAR SURGICAL HISTORY: EVAR 05/04/2020  VASCULAR RISK FACTORS: Negative history of cerebrovascular disease / stroke / transient ischemic attack. Negative history of coronary artery disease.  Negative history of diabetes mellitus.  Negative history of smoking.  Positive history of hypertension.  Negative history of chronic kidney disease. Negative history of chronic obstructive pulmonary disease.  AMBULATORY STATUS: Ambulatory within the community without limits  Past Medical History:  Diagnosis Date   AAA  (abdominal aortic aneurysm)    last u/s done 07/18/17    Aneurysm of left internal iliac artery 05/22/2022   Arthritis    Bradycardia    CVA (cerebral vascular accident) (HCC) 10/04/2022   Dysrhythmia    frequent PAC, for 20 years   GERD (gastroesophageal reflux disease)    occ, OTC   Hemorrhoids    History of hiatal hernia    Hyperlipidemia    Hypertension    S/P aortic dissection repair 09/18/2022   Seasonal allergies    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) 10/26/2022    Past Surgical History:  Procedure Laterality Date   ABDOMINAL AORTIC ENDOVASCULAR STENT GRAFT N/A 05/04/2020   Procedure: ABDOMINAL AORTIC ENDOVASCULAR STENT GRAFT;  Surgeon: Oris Krystal FALCON, MD;  Location: [facility];  Service: Vascular;  Laterality: N/A;   CATARACT EXTRACTION Bilateral 2009   DIAGNOSTIC LAPAROSCOPY     laparoscopic hernia repair   EYE SURGERY Bilateral    cataract removal   HERNIA REPAIR Bilateral 1999, 2006   JOINT REPLACEMENT Right ~2018   hip replacement   pheochromocytoma  1993   PROSTATECTOMY N/A 05/15/2013   Procedure: PROSTATECTOMY RETROPUBIC; SIMPLE OPEN PROSTATECTOMY;  Surgeon: Alm GORMAN Fragmin, MD;  Location: [facility] ORS;  Service: Urology;  Laterality: N/A;   REPAIR OF ACUTE ASCENDING THORACIC AORTIC DISSECTION N/A 09/17/2022   Procedure: REPAIR OF ACUTE ASCENDING THORACIC AORTIC DISSECTION USING 28 MM HEMASHIELD PLATINUM VASCULAR GRAFT;  Surgeon: Kerrin Elspeth BROCKS, MD;  Location: [facility] OR;  Service: Vascular;  Laterality: N/A;  Median sternotomy   TOTAL ELBOW REPLACEMENT Left    > 30 years ago   TOTAL HIP ARTHROPLASTY Right 09/08/2017   Procedure:  RIGHT TOTAL HIP ARTHROPLASTY ANTERIOR APPROACH;  Surgeon: Vernetta Lonni GRADE, MD;  Location: [facility];  Service: Orthopedics;  Laterality: Right;   ULTRASOUND GUIDANCE FOR VASCULAR ACCESS Bilateral 05/04/2020   Procedure: ULTRASOUND GUIDANCE FOR VASCULAR ACCESS;  Surgeon: Oris Krystal FALCON, MD;  Location: [facility] OR;  Service: Vascular;  Laterality:  Bilateral;    Family History  Problem Relation Age of Onset   Heart disease Mother    Coronary artery disease Mother    Aneurysm Father    Diabetes Sister     Social History   Socioeconomic History   Marital status: Married    Spouse name: Not on file   Number of children: Not on file   Years of education: Not on file   Highest education level: Not on file  Occupational History   Not on file  Tobacco Use   Smoking status: Never   Smokeless tobacco: Never  Vaping Use   Vaping status: Never Used  Substance and Sexual Activity   Alcohol use: Yes    Comment: 2 or 3 drinks on weekends   Drug use: No   Sexual activity: Not on file  Other Topics Concern   Not on file  Social History Narrative   Not on file   Social Drivers of Health   Tobacco Use: Low Risk (11/12/2024)   Patient History    Smoking Tobacco Use: Never    Smokeless Tobacco Use: Never    Passive Exposure: Not on file  Financial Resource Strain: Not on file  Food Insecurity: No Food Insecurity (09/22/2022)   Hunger Vital Sign    Worried About Running Out of Food in the Last Year: Never true    Ran Out of Food in the Last Year: Never true  Transportation Needs: No Transportation Needs (09/22/2022)   PRAPARE - Administrator, Civil Service (Medical): No    Lack of Transportation (Non-Medical): No  Physical Activity: Not on file  Stress: Not on file  Social Connections: Not on file  Intimate Partner Violence: Not At Risk (09/22/2022)   Humiliation, Afraid, Rape, and Kick questionnaire    Fear of Current or Ex-Partner: No    Emotionally Abused: No    Physically Abused: No    Sexually Abused: No  Depression (PHQ2-9): Low Risk (06/29/2023)   Depression (PHQ2-9)    PHQ-2 Score: 0  Alcohol Screen: Not on file  Housing: Low Risk (09/22/2022)   Housing    Last Housing Risk Score: 0  Utilities: Not At Risk (09/22/2022)   AHC Utilities    Threatened with loss of utilities: No  Health Literacy: Not  on file    Allergies  Allergen Reactions   Quinolones Other (See Comments)    Aortic dissection     Current Outpatient Medications  Medication Sig Dispense Refill   aspirin  81 MG chewable tablet Chew 1 tablet (81 mg total) by mouth daily. 30 tablet 0   atorvastatin  (LIPITOR) 40 MG tablet Take 1 tablet (40 mg total) by mouth daily. 30 tablet 0   citalopram  (CELEXA ) 20 MG tablet Take 20 mg by mouth daily.     doxazosin  (CARDURA ) 1 MG tablet Take 1 tablet (1 mg total) by mouth at bedtime. 30 tablet 0   Methylfol-Algae-B12-Acetylcyst (CEREFOLIN NAC) 6-90.314-2-600 MG TABS Take 1 tablet by mouth daily. 90 tablet 3   metoprolol  tartrate (LOPRESSOR ) 25 MG tablet Take 0.5 tablets (12.5 mg total) by mouth 2 (two) times daily. 15 tablet 0   mirtazapine (  REMERON) 15 MG tablet Take 10 mg by mouth at bedtime.     tamsulosin  (FLOMAX ) 0.4 MG CAPS capsule Take 0.4 mg by mouth daily.     No current facility-administered medications for this visit.    PHYSICAL EXAM Vitals:   11/12/24 1237  BP: 133/70  Pulse: (!) 109  Temp: 98.2 F (36.8 C)  SpO2: 92%  Weight: 190 lb (86.2 kg)  Height: 6' (1.829 m)   No distress Regular rate and rhythm Unlabored breathing  PERTINENT LABORATORY AND RADIOLOGIC DATA  Most recent CBC    Latest Ref Rng & Units 12/28/2022    2:54 PM 10/30/2022    7:25 PM 10/17/2022    7:59 AM  CBC  WBC 4.0 - 10.5 K/uL 9.7  12.9  9.0   Hemoglobin 13.0 - 17.0 g/dL 88.3  89.1  89.8   Hematocrit 39.0 - 52.0 % 38.9  34.6  31.8   Platelets 150 - 400 K/uL 185  174  140      Most recent CMP    Latest Ref Rng & Units 12/28/2022    2:54 PM 10/30/2022    7:25 PM 10/17/2022    7:59 AM  CMP  Glucose 70 - 99 mg/dL 881  890  875   BUN 8 - 23 mg/dL 24  18  15    Creatinine 0.61 - 1.24 mg/dL 8.70  8.93  8.86   Sodium 135 - 145 mmol/L 139  138  138   Potassium 3.5 - 5.1 mmol/L 4.4  4.2  3.9   Chloride 98 - 111 mmol/L 105  101  106   CO2 22 - 32 mmol/L 25  29  24    Calcium  8.9 -  10.3 mg/dL 9.2  8.8  8.4     Renal function CrCl cannot be calculated (Patient's most recent lab result is older than the maximum 21 days allowed.).  Hgb A1c MFr Bld (%)  Date Value  09/19/2022 5.5    LDL Chol Calc (NIH)  Date Value Ref Range Status  05/28/2020 87 0 - 99 mg/dL Final   LDL Cholesterol  Date Value Ref Range Status  09/19/2022 42 0 - 99 mg/dL Final    Comment:           Total Cholesterol/HDL:CHD Risk Coronary Heart Disease Risk Table                     Men   Women  1/2 Average Risk   3.4   3.3  Average Risk       5.0   4.4  2 X Average Risk   9.6   7.1  3 X Average Risk  23.4   11.0        Use the calculated Patient Ratio above and the CHD Risk Table to determine the patient's CHD Risk.        ATP III CLASSIFICATION (LDL):  <100     mg/dL   Optimal  899-870  mg/dL   Near or Above                    Optimal  130-159  mg/dL   Borderline  839-810  mg/dL   High  >809     mg/dL   Very High Performed at [facility], [address] [place], [place] [address]     Endovascular Aortic Repair Study (EVAR)   Patient Name:  NINA HOAR  Date of Exam:  11/12/2024  Medical Rec #: [id number]         Accession #:    [id number]  Date of Birth: 04/08/1941        Patient Gender: M  Patient Age:   7 years  Exam Location:  [address]  Procedure:      VAS US  EVAR DUPLEX  Referring Phys: DEBBY ROBERTSON    ---------------------------------------------------------------------------  -----    Indications: Follow up exam for EVAR.   Vascular Interventions: Stent graft repair 05/04/2020.   Limitations: Air/bowel gas and patient was not NPO.     Comparison Study: 08/20/24: CT Angio: Residual sac 3.7 cm; Rt CIA 2.6 cm,  Rt IIA                    3.0 cm; Lt CIA 2.8 cm, Lt IIA 3.6 cm.                    06/08/2021: IVC/Iliac: The aortic sac not measures 3.90 x  3.60                   cms.                    Dilitation of the right internal iliac  artery measuring  2.54 x                    2                    21 cm.                    The left internal iliac artery measures 3.03 x 2.75 cms  and is                    partially thrombosed with a residual lumen of 1.62 x  1.51 cm.     Performing Technologist: Stoney Ross RVT     Examination Guidelines: A complete evaluation includes B-mode imaging,  spectral  Doppler, color Doppler, and power Doppler as needed of all accessible  portions  of each vessel. Bilateral testing is considered an integral part of a  complete  examination. Limited examinations for reoccurring indications may be  performed  as noted.     Endovascular Aortic Repair (EVAR):  +----------+----------------+---------------+-----------------+------------  ----+           Diameter AP (cm)Diameter Trans Velocities       Comments                                     (cm)           (cm/sec)                            +----------+----------------+---------------+-----------------+------------  ----+  Aorta    3.57            3.72           123                                 +----------+----------------+---------------+-----------------+------------  ----+  Right Limb2.50            2.50           58                                  +----------+----------------+---------------+-----------------+------------  ----+  Left Limb 3.60            2.50           46               Thrombus  present  +----------+----------------+---------------+-----------------+------------  ----+   +-------------+----------------+  Endoleak TypeNone visualized.  +-------------+----------------+       Summary:  Abdominal Aorta: There is evidence of abnormal dilation of the Left Common  Iliac artery. Patent endovascular aneurysm repair with no evidence of  endoleak. Right internal iliac artery not well visualized for accurate  diameter measurement. Left internal  iliac artery measures  3.8 x 2.7 cm with thrombus noted.    *See table(s) above for measurements and observations.    Debby SAILOR. Magda, MD [facility] Phone Number: [phone] 11/12/2024 12:40 PM

## 2024-11-25 ENCOUNTER — Telehealth: Payer: Self-pay | Admitting: Neurology

## 2024-11-25 NOTE — Telephone Encounter (Signed)
 LVM telling pt's wife to call back.

## 2024-11-25 NOTE — Telephone Encounter (Signed)
 Pt's wife states they would like a 2nd opinion, one from Ary Cummins, MD, PhD [phone]

## 2024-12-11 ENCOUNTER — Other Ambulatory Visit: Payer: Self-pay

## 2024-12-11 ENCOUNTER — Telehealth: Payer: Self-pay

## 2024-12-11 NOTE — Telephone Encounter (Signed)
 Pt's wife, Karoline, called asking for another recommendation for a neurologist.  Dr. Jerri to whom she was referred by Dr. Magda is not taking new patients.  Request made to Dr. Magda for another provider.  Otherwise, pt can see anyone in the [facility] practice.  Patient is wanting to establish with another neurologist.

## 2025-07-03 ENCOUNTER — Ambulatory Visit: Admitting: Neurology
